# Patient Record
Sex: Female | Born: 1940 | Race: White | Hispanic: No | Smoking: Former smoker
Health system: Southern US, Community
[De-identification: ages and names within clinical notes are randomized; demographics above are authoritative.]

## PROBLEM LIST (undated history)

## (undated) DIAGNOSIS — K219 Gastro-esophageal reflux disease without esophagitis: Secondary | ICD-10-CM

## (undated) DIAGNOSIS — E785 Hyperlipidemia, unspecified: Secondary | ICD-10-CM

## (undated) DIAGNOSIS — K635 Polyp of colon: Secondary | ICD-10-CM

## (undated) DIAGNOSIS — F419 Anxiety disorder, unspecified: Secondary | ICD-10-CM

## (undated) DIAGNOSIS — M1990 Unspecified osteoarthritis, unspecified site: Secondary | ICD-10-CM

## (undated) DIAGNOSIS — K5792 Diverticulitis of intestine, part unspecified, without perforation or abscess without bleeding: Secondary | ICD-10-CM

## (undated) DIAGNOSIS — K7581 Nonalcoholic steatohepatitis (NASH): Secondary | ICD-10-CM

## (undated) DIAGNOSIS — E669 Obesity, unspecified: Secondary | ICD-10-CM

## (undated) DIAGNOSIS — J189 Pneumonia, unspecified organism: Secondary | ICD-10-CM

## (undated) DIAGNOSIS — I10 Essential (primary) hypertension: Secondary | ICD-10-CM

## (undated) DIAGNOSIS — K7460 Unspecified cirrhosis of liver: Secondary | ICD-10-CM

## (undated) HISTORY — DX: Hyperlipidemia, unspecified: E78.5

## (undated) HISTORY — DX: Polyp of colon: K63.5

## (undated) HISTORY — DX: Gastro-esophageal reflux disease without esophagitis: K21.9

## (undated) HISTORY — DX: Diverticulitis of intestine, part unspecified, without perforation or abscess without bleeding: K57.92

## (undated) HISTORY — DX: Unspecified osteoarthritis, unspecified site: M19.90

## (undated) HISTORY — DX: Obesity, unspecified: E66.9

## (undated) HISTORY — PX: ROTATOR CUFF REPAIR: SHX139

## (undated) HISTORY — PX: VEIN SURGERY: SHX48

## (undated) HISTORY — DX: Nonalcoholic steatohepatitis (NASH): K75.81

## (undated) HISTORY — DX: Essential (primary) hypertension: I10

---

## 1970-08-30 HISTORY — PX: PARTIAL HYSTERECTOMY: SHX80

## 1971-08-31 HISTORY — PX: OOPHORECTOMY: SHX86

## 1979-08-31 HISTORY — PX: CHOLECYSTECTOMY: SHX55

## 2000-01-13 ENCOUNTER — Encounter: Payer: Self-pay | Admitting: Emergency Medicine

## 2000-01-13 ENCOUNTER — Inpatient Hospital Stay (HOSPITAL_COMMUNITY): Admission: EM | Admit: 2000-01-13 | Discharge: 2000-01-14 | Payer: Self-pay | Admitting: Emergency Medicine

## 2000-03-10 ENCOUNTER — Encounter: Payer: Self-pay | Admitting: Gastroenterology

## 2000-03-10 ENCOUNTER — Ambulatory Visit (HOSPITAL_COMMUNITY): Admission: RE | Admit: 2000-03-10 | Discharge: 2000-03-10 | Payer: Self-pay | Admitting: Gastroenterology

## 2001-04-12 ENCOUNTER — Encounter: Admission: RE | Admit: 2001-04-12 | Discharge: 2001-04-12 | Payer: Self-pay | Admitting: Obstetrics and Gynecology

## 2001-04-12 ENCOUNTER — Encounter: Payer: Self-pay | Admitting: Obstetrics and Gynecology

## 2001-07-28 ENCOUNTER — Emergency Department (HOSPITAL_COMMUNITY): Admission: EM | Admit: 2001-07-28 | Discharge: 2001-07-28 | Payer: Self-pay | Admitting: Emergency Medicine

## 2001-07-28 ENCOUNTER — Encounter: Payer: Self-pay | Admitting: Emergency Medicine

## 2002-05-23 ENCOUNTER — Encounter: Admission: RE | Admit: 2002-05-23 | Discharge: 2002-06-15 | Payer: Self-pay | Admitting: Orthopedic Surgery

## 2002-07-16 ENCOUNTER — Encounter: Admission: RE | Admit: 2002-07-16 | Discharge: 2002-10-02 | Payer: Self-pay | Admitting: Orthopedic Surgery

## 2003-04-23 ENCOUNTER — Other Ambulatory Visit: Admission: RE | Admit: 2003-04-23 | Discharge: 2003-04-23 | Payer: Self-pay | Admitting: Obstetrics & Gynecology

## 2003-06-03 ENCOUNTER — Encounter: Admission: RE | Admit: 2003-06-03 | Discharge: 2003-06-18 | Payer: Self-pay | Admitting: Orthopedic Surgery

## 2004-03-25 ENCOUNTER — Encounter: Admission: RE | Admit: 2004-03-25 | Discharge: 2004-05-26 | Payer: Self-pay | Admitting: Orthopedic Surgery

## 2005-08-24 ENCOUNTER — Ambulatory Visit (HOSPITAL_COMMUNITY): Admission: RE | Admit: 2005-08-24 | Discharge: 2005-08-24 | Payer: Self-pay | Admitting: Otolaryngology

## 2005-08-24 ENCOUNTER — Encounter: Admission: RE | Admit: 2005-08-24 | Discharge: 2005-08-24 | Payer: Self-pay | Admitting: Otolaryngology

## 2006-04-18 ENCOUNTER — Encounter: Admission: RE | Admit: 2006-04-18 | Discharge: 2006-04-18 | Payer: Self-pay

## 2006-05-25 ENCOUNTER — Encounter: Admission: RE | Admit: 2006-05-25 | Discharge: 2006-05-25 | Payer: Self-pay | Admitting: Family Medicine

## 2007-01-26 ENCOUNTER — Encounter: Payer: Self-pay | Admitting: Pulmonary Disease

## 2007-02-02 ENCOUNTER — Encounter: Payer: Self-pay | Admitting: Pulmonary Disease

## 2010-04-09 ENCOUNTER — Ambulatory Visit (HOSPITAL_COMMUNITY)
Admission: RE | Admit: 2010-04-09 | Discharge: 2010-04-10 | Payer: Self-pay | Source: Home / Self Care | Admitting: Obstetrics and Gynecology

## 2010-07-24 DIAGNOSIS — E669 Obesity, unspecified: Secondary | ICD-10-CM | POA: Insufficient documentation

## 2010-07-24 DIAGNOSIS — M25559 Pain in unspecified hip: Secondary | ICD-10-CM | POA: Insufficient documentation

## 2010-07-24 DIAGNOSIS — K2100 Gastro-esophageal reflux disease with esophagitis, without bleeding: Secondary | ICD-10-CM | POA: Insufficient documentation

## 2010-07-24 DIAGNOSIS — J309 Allergic rhinitis, unspecified: Secondary | ICD-10-CM | POA: Insufficient documentation

## 2010-07-24 DIAGNOSIS — E782 Mixed hyperlipidemia: Secondary | ICD-10-CM | POA: Insufficient documentation

## 2010-07-24 DIAGNOSIS — E1165 Type 2 diabetes mellitus with hyperglycemia: Secondary | ICD-10-CM | POA: Insufficient documentation

## 2010-07-27 ENCOUNTER — Ambulatory Visit: Payer: Self-pay | Admitting: Pulmonary Disease

## 2010-07-27 DIAGNOSIS — R0609 Other forms of dyspnea: Secondary | ICD-10-CM | POA: Insufficient documentation

## 2010-07-27 DIAGNOSIS — R0989 Other specified symptoms and signs involving the circulatory and respiratory systems: Secondary | ICD-10-CM | POA: Insufficient documentation

## 2010-07-30 ENCOUNTER — Encounter: Payer: Self-pay | Admitting: Pulmonary Disease

## 2010-08-13 ENCOUNTER — Ambulatory Visit: Payer: Self-pay | Admitting: Pulmonary Disease

## 2010-08-27 ENCOUNTER — Ambulatory Visit: Payer: Self-pay | Admitting: Pulmonary Disease

## 2010-08-27 ENCOUNTER — Encounter: Payer: Self-pay | Admitting: Pulmonary Disease

## 2010-08-27 DIAGNOSIS — J45909 Unspecified asthma, uncomplicated: Secondary | ICD-10-CM | POA: Insufficient documentation

## 2010-08-27 DIAGNOSIS — J449 Chronic obstructive pulmonary disease, unspecified: Secondary | ICD-10-CM | POA: Insufficient documentation

## 2010-09-09 ENCOUNTER — Ambulatory Visit: Payer: Self-pay | Admitting: Cardiology

## 2010-09-14 ENCOUNTER — Telehealth (INDEPENDENT_AMBULATORY_CARE_PROVIDER_SITE_OTHER): Payer: Self-pay | Admitting: *Deleted

## 2010-09-15 ENCOUNTER — Ambulatory Visit (HOSPITAL_COMMUNITY)
Admission: RE | Admit: 2010-09-15 | Discharge: 2010-09-15 | Payer: Self-pay | Source: Home / Self Care | Attending: Cardiology | Admitting: Cardiology

## 2010-09-15 ENCOUNTER — Encounter: Payer: Self-pay | Admitting: Internal Medicine

## 2010-09-15 ENCOUNTER — Encounter: Payer: Self-pay | Admitting: Cardiology

## 2010-09-15 ENCOUNTER — Encounter (HOSPITAL_COMMUNITY)
Admission: RE | Admit: 2010-09-15 | Discharge: 2010-09-29 | Payer: Self-pay | Source: Home / Self Care | Attending: Cardiology | Admitting: Cardiology

## 2010-09-15 ENCOUNTER — Ambulatory Visit: Admission: RE | Admit: 2010-09-15 | Discharge: 2010-09-15 | Payer: Self-pay | Source: Home / Self Care

## 2010-09-17 ENCOUNTER — Ambulatory Visit: Admission: RE | Admit: 2010-09-17 | Discharge: 2010-09-17 | Payer: Self-pay | Source: Home / Self Care

## 2010-09-29 NOTE — Assessment & Plan Note (Signed)
Summary: consult for doe   Visit Type:  Follow-up Copy to:  Deborah Chalk Primary Provider/Referring Provider:  Catha Gosselin MD  CC:  Pulmonary Consult. pt being evaluated for asthma and increased SOB. Marland Kitchen  History of Present Illness: The pt is a 69y/o female who I have been asked to see for dyspnea.  The pt states that she had "asthma" diagnosed at age 52, and has had breathing issues for years.  She feels this has been getting worse this year.  She describes a one block doe on flat ground at a moderate pace, and will also get winded bringing groceries in from the car and vacuuming one room of her house.  She has cough at times, and is usually dry.  This is worse on lying down.  She feels the symptoms get better with steroids, but usually raises blood sugar.  She has tried qvar for the last one month, and feels it did not help.  She does have albuterol for rescue, and has been using it frequently.  The pt tells me she has a h/o an "enlarged heart", and is followed by Dr. Deborah Chalk.  Her last echo was 3 years ago.  She c/o sharp chest pain today with diaphoresis, and EKG in the office currently is normal.  She tells me this usually goes away with prilosec.  The pt is not active physically, and her weight is up about 15 pounds over the past one year.  She has had spirometry at Dr. Fredirick Maudlin office, and I have reviewed the tracings and data.  Unfortunately, they indicate poor test performance, with the majority not reaching a 6 sec expiratory time.  The pt has not had a recent cxr.  Preventive Screening-Counseling & Management  Alcohol-Tobacco     Smoking Status: quit  Current Medications (verified): 1)  Align  Caps (Probiotic Product) .... Per Bottle 2)  Simvastatin 80 Mg Tabs (Simvastatin) .... Once Daily 3)  Sertraline Hcl 100 Mg Tabs (Sertraline Hcl) .... Once Daily 4)  Hydrochlorothiazide 25 Mg Tabs (Hydrochlorothiazide) .... Once Daily 5)  Ramipril 5 Mg Caps (Ramipril) .... Once Daily 6)  Librax  2.5-5 Mg Caps (Clidinium-Chlordiazepoxide) .Marland Kitchen.. 1 Two Times A Day 7)  Hydrocortisone 2.5 % Crea (Hydrocortisone) .... As Directed 8)  Metformin Hcl 500 Mg Tabs (Metformin Hcl) .... Once Daily 9)  Fish Oil  Oil (Fish Oil) .... Once Daily 10)  Prilosec Otc 20 Mg Tbec (Omeprazole Magnesium) .... Once Daily As Needed  Allergies (verified): 1)  ! 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 family history of heart disease--sister stomach cancer--mother breast cancer--daughter aplastic anemia--son  Social History: Reviewed history and no changes required. 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 quit 2002. 1 1/2 ppd. started age 19 Smoking Status:  quit  Review of Systems       The patient complains of shortness of breath with activity, non-productive cough, chest pain, acid heartburn, weight change, nasal congestion/difficulty breathing through nose, ear ache, and anxiety.  The patient denies shortness of breath at rest, productive cough, coughing up blood, irregular heartbeats, indigestion, abdominal pain, difficulty swallowing,  sore throat, tooth/dental problems, headaches, sneezing, itching, depression, hand/feet swelling, joint stiffness or pain, rash, change in color of mucus, and fever.    Vital Signs:  Patient profile:   70 year old female Height:      65.5 inches Weight:      233.38 pounds BMI:     38.38 O2  Sat:      96 % on Room air Temp:     98.3 degrees F oral Pulse rate:   104 / minute BP sitting:   124 / 78  (left arm) Cuff size:   large  Vitals Entered By: Carver Fila (July 27, 2010 2:46 PM)  O2 Flow:  Room air CC: Pulmonary Consult. pt being evaluated for asthma and increased SOB.  Comments meds and allergies updated Phone number updated Carver Fila  July 27, 2010 2:46 PM    Physical Exam  General:  morbidly obese female in nad Initially, had chest discomfort and significant diaphoresis...resolved with prilosec. Eyes:  PERRLA and EOMI.   Nose:  patent without discharge Mouth:  clear, no exudates. Neck:  large neck, no jvd, tmg, LN Lungs:  totally clear to auscultation no wheezing or rhonchi Heart:  rrr, no mrg Abdomen:  soft and nontender, bs+ Extremities:  +edema noted, no cyanosis  pulses intact distally Neurologic:  alert and oriented, moves all 4.   Impression & Recommendations:  Problem # 1:  DYSPNEA ON EXERTION (ICD-786.09) the pt has doe of unknown etiology.  I have reviewed the various possible causes with her, and it is unclear if she has a pulmonary issue.  She is morbidly obese and clearly has deconditioning.  She also has significant reflux disease which can cause the perception of doe.  It is unclear whether she has asthma or not, but find it unlikely if she did not have improvement with qvar (an excellent medication for asthma).  Would also consider whether she needs further cardiac w/u.  At this point, would like to do full pfts and see if we can get adequate technique/data.  Will also check a cxr for completeness.  Would also recommend that she come off ACE for a trial period to see if this contributes to her upper airway symptoms as well.  Problem # 2:  REFLUX ESOPHAGITIS (ICD-530.11) the pt had chest pain and diaphoresis on presentation today that resolved with prilosec?  Her EKG was unremarkable.  Would like to try her on more aggressive treatment  for reflux, and I have also asked her to stop fish oil for a period of time (other option is to store in freezer).  Medications Added to Medication List This Visit: 1)  Prilosec Otc 20 Mg Tbec (Omeprazole magnesium) .... Once daily as needed 2)  Omeprazole 40 Mg Cpdr (Omeprazole) .... One in am and pm  Other Orders: Consultation Level V (16109) EKG w/ Interpretation (93000) Pulmonary Referral (Pulmonary) T-2 View CXR (71020TC)  Patient Instructions: 1)  will check cxr today, and call you with the results. 2)  will schedule for full breathing studies, and see you back on same day to discuss. 3)  stop prilosec.  Will start omeprazole 40mg  one in am and pm while we are doing your evaluation. 4)  stay off fish oil. 5)  will send a note to Drs Clarene Duke and Deborah Chalk to consider changing your ramipril to another medication as a trial.   6)  work on weight loss and conditioning.  Prescriptions: OMEPRAZOLE 40 MG CPDR (OMEPRAZOLE) one in am  and pm  #60 x 1   Entered and Authorized by:   Barbaraann Share MD   Signed by:   Barbaraann Share MD on 07/27/2010   Method used:   Print then Give to Patient   RxID:   [id number]    Immunization History:  Influenza Immunization History:    Influenza:  historical (05/30/2010)  Pneumovax Immunization History:    Pneumovax:  historical (05/30/2008)

## 2010-10-01 NOTE — Progress Notes (Signed)
Summary: nuc pre procedure  Phone Note Outgoing Call Call back at Home Phone [phone]   Call placed by: Cathlyn Parsons RN,  September 14, 2010 2:35 PM Call placed to: Patient Reason for Call: Confirm/change Appt Summary of Call: Reviewed information on [employer] Information Sheet (see scanned document for further details).  Spoke with patient.      Nuclear Med Background Indications for Stress Test: Evaluation for Ischemia   History: Myocardial Perfusion Study  History Comments: 08 MPS nl  Symptoms: Chest Pain, DOE    Nuclear Pre-Procedure Cardiac Risk Factors: Family History - CAD, History of Smoking, Hypertension, Lipids, NIDDM Height (in): 66

## 2010-10-01 NOTE — Assessment & Plan Note (Signed)
Summary: Cardiology Nuclear Testing  Nuclear Med Background Indications for Stress Test: Evaluation for Ischemia   History: Asthma, Echo, Myocardial Perfusion Study  History Comments: 08 MPS nl and Echo EF=nl; mild LVH   Symptoms: Diaphoresis, DOE, Fatigue, Nausea, Palpitations, Rapid HR  Symptoms Comments: last episodex6weeks ago in MD office   Nuclear Pre-Procedure Cardiac Risk Factors: Family History - CAD, History of Smoking, Hypertension, Lipids, NIDDM Caffeine/Decaff Intake: None NPO After: 8:30 PM Lungs: clear IV 0.9% NS with Angio Cath: 20g     IV Site: R Antecubital IV Started by: Irven Baltimore, RN Chest Size (in) 42     Cup Size B     Height (in): 66 Weight (lb): 228 BMI: 36.93  Nuclear Med Study 1 or 2 day study:  2 day     Stress Test Type:  Carlton Adam Reading MD:  Kirk Ruths, MD     Referring MD:  S.Tennant Resting Radionuclide:  Technetium 61mTetrofosmin     Resting Radionuclide Dose:  33 mCi  Stress Radionuclide:  Technetium 919metrofosmin     Stress Radionuclide Dose:  33 mCi   Stress Protocol   Lexiscan: 0.4 mg   Stress Test Technologist:  TeIleene HutchinsonEMT-P     Nuclear Technologist:  StCharlton AmorCNMT  Rest Procedure  Myocardial perfusion imaging was performed at rest 45 minutes following the intravenous administration of Technetium 9956mtrofosmin.  Stress Procedure  The patient received IV Lexiscan 0.4 mg over 15-seconds.  Technetium 33m16mrofosmin injected at 30-seconds.  There were no significant changes with infusion.  Quantitative spect images were obtained after a 45 minute delay.  QPS Raw Data Images:  Soft tissue (diaphragm, breast) surround heart. Stress Images:  Normal homogeneous uptake in all areas of the myocardium. Rest Images:  Normal homogeneous uptake in all areas of the myocardium. Subtraction (SDS):  No evidence of ischemia. Transient Ischemic Dilatation:  0.96  (Normal <1.22)  Lung/Heart Ratio:  0.32  (Normal  <0.45)  Quantitative Gated Spect Images QGS EDV:  79 ml QGS ESV:  24 ml QGS EF:  70 %   Overall Impression  Exercise Capacity: Lexiscan with no exercise. BP Response: Normal blood pressure response. Clinical Symptoms: No chest pain ECG Impression: No significant ST segment change suggestive of ischemia. Overall Impression: Normal stress nuclear study.  Appended Document: Cardiology Nuclear Testing COPY SENT TO DR. TENNDoreatha Lew

## 2010-10-01 NOTE — Assessment & Plan Note (Signed)
Summary: rov for dyspnea   Copy to:  Deborah Chalk Primary Provider/Referring Provider:  Catha Gosselin MD  CC:  Pt is here for a f/u appt to discuss PFT results.  Pt states she had a "cold" a few weeks ago and coughed up bright red blood but states she is feeling better now. .  History of Present Illness: The pt comes in today for f/u of her doe.  At the last visit, she was scheduled for pfts, and also addressed possible upper airway dysfunction issues with initiating a ppi.  It was also suggested that she come off ACE for a period of time.  The pt thinks her cough is better with PPI, but remains on her ACE at this time.  She had a URI a few weeks ago with scant mucus with red streaks, but none since.  Her cxr last visit showed CM and prominent BV markings suggestive of mild edema, but no airspace disease or parenchymal lung disease.  She had pfts today that show moderate airflow obstruction with airtrapping, no restriction, and a very mild decrease in DLCO.  I have reviewed her pfts with her in detail, and answered all of her questions.  Current Medications (verified): 1)  Align  Caps (Probiotic Product) .... Per Bottle 2)  Simvastatin 80 Mg Tabs (Simvastatin) .... Once Daily 3)  Sertraline Hcl 100 Mg Tabs (Sertraline Hcl) .... Once Daily 4)  Hydrochlorothiazide 25 Mg Tabs (Hydrochlorothiazide) .... Once Daily 5)  Ramipril 5 Mg Caps (Ramipril) .... Once Daily 6)  Librax 2.5-5 Mg Caps (Clidinium-Chlordiazepoxide) .Marland Kitchen.. 1 Two Times A Day 7)  Hydrocortisone 2.5 % Crea (Hydrocortisone) .... As Directed 8)  Metformin Hcl 500 Mg Tabs (Metformin Hcl) .... Once Daily 9)  Omeprazole 40 Mg Cpdr (Omeprazole) .... One in Am and Pm  Allergies (verified): 1)  ! Codeine  Past History:  Past medical, surgical, family and social histories (including risk factors) reviewed, and no changes noted (except as noted below).  Past Medical History: Reviewed history from 07/27/2010 and no changes required.  MIXED  HYPERLIPIDEMIA (ICD-272.2) REFLUX ESOPHAGITIS (ICD-530.11)--h/o GERD, h/o stricture with dilatation ALLERGIC RHINITIS CAUSE UNSPECIFIED (ICD-477.9) DIAB W/O COMP TYPE II/UNS NOT STATED UNCNTRL (ICD-250.00) PAIN IN JOINT PELVIC REGION AND THIGH (ICD-719.45) UNSPECIFIED ESSENTIAL HYPERTENSION (ICD-401.9) OBESITY, UNSPECIFIED (ICD-278.00) PURE HYPERCHOLESTEROLEMIA (ICD-272.0)    Past Surgical History: Reviewed history from 07/27/2010 and no changes required. Cholecystectomy sinus surgery hysterectomy  Family History: Reviewed history from 07/27/2010 and no changes required. family history of heart disease--sister stomach cancer--mother breast cancer--daughter aplastic anemia--son  Social History: Reviewed history from 07/27/2010 and no changes required. 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 quit 2002. 1 1/2 ppd. started age 67  Review of Systems       The patient complains of shortness of breath with activity, non-productive cough, acid heartburn, anxiety, and hand/feet swelling.  The patient denies shortness of breath at rest, productive cough, coughing up blood, chest pain, irregular heartbeats, indigestion, loss of appetite, weight change, abdominal pain, difficulty swallowing, sore throat, tooth/dental problems, headaches, nasal congestion/difficulty breathing through nose, sneezing, itching, ear ache, depression, joint stiffness or pain, rash, change in color of mucus, and fever.    Vital Signs:  Patient profile:   70 year old female Height:      66 inches Weight:      227 pounds BMI:     36.77 O2 Sat:      93 % on Room air Temp:  97.8 degrees F oral Pulse rate:   101 / minute BP sitting:   120 / 64  (left arm) Cuff size:   large  Vitals Entered By: Arman Filter LPN (August 27, 2010 11:45 AM)  O2 Flow:  Room air CC: Pt is here for a f/u appt to discuss PFT results.  Pt states she had a "cold" a few weeks  ago and coughed up bright red blood but states she is feeling better now.  Comments Medications reviewed with patient Arman Filter LPN  August 27, 2010 11:49 AM    Physical Exam  General:  ow female in nad Extremities:  mild edema noted, no cyanosis Neurologic:  alert and oriented, moves all 4.   Impression & Recommendations:  Problem # 1:  INTRINSIC ASTHMA, UNSPECIFIED (ICD-493.10) the pt was found to have moderate airflow obstruction by pfts today, but DLCO was only minimally reduced.  Most likely this is due to asthma, but she can easily have a component of emphysema as well with her past smoking history.  I think she would benefit from a trial with a ICS/LABA combination, although she did not see much improvement with qvar alone.  I still think she has a lot of upper airway issues as well, with reflux and possibly ACE contributing.   Would like to try her off ramipril for a period of time, and asked her to continue with the omeprazole.    Problem # 2:  DYSPNEA ON EXERTION (ICD-786.09)  I suspect the pt has multifactorial dyspnea.  She has airflow obstruction as above, is overweight with deconditioning, and has cardiac issues.  I have asked her to work hard on weight loss and some type of conditioning program.    Other Orders: Est. Patient Level III (16109)  Patient Instructions: 1)  change ramipril to micardis 40mg  one each day until you see Dr. Deborah Chalk. 2)  trial of dulera 100/5  2 inhalations am and pm everyday...rinse mouth well after using.   3)  continue with omeprazole 4)  work on weight loss and conditioning. 5)  followup with me in 6 weeks.

## 2010-10-01 NOTE — Miscellaneous (Signed)
Summary: Orders Update pft charges  Clinical Lists Changes  Orders: Added new Service order of Carbon Monoxide diffusing w/capacity (94720) - Signed Added new Service order of Lung Volumes (94240) - Signed Added new Service order of Spirometry (Pre & Post) (94060) - Signed 

## 2010-10-05 ENCOUNTER — Ambulatory Visit (INDEPENDENT_AMBULATORY_CARE_PROVIDER_SITE_OTHER): Payer: Medicare Other | Admitting: Pulmonary Disease

## 2010-10-05 ENCOUNTER — Encounter: Payer: Self-pay | Admitting: Pulmonary Disease

## 2010-10-05 DIAGNOSIS — J45909 Unspecified asthma, uncomplicated: Secondary | ICD-10-CM

## 2010-10-05 DIAGNOSIS — R0989 Other specified symptoms and signs involving the circulatory and respiratory systems: Secondary | ICD-10-CM

## 2010-10-05 DIAGNOSIS — R0609 Other forms of dyspnea: Secondary | ICD-10-CM

## 2010-10-15 NOTE — Assessment & Plan Note (Signed)
Summary: rov for asthma, upper airway dysfunction   Copy to:  Doreatha Lew Primary Provider/Referring Provider:  Hulan Fess MD  CC:  Followup on dyspnea.  Pt states that her breathing has improved since last seen.  Only has occ cough due to "tickle in throat".  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  She was started on dulera, and has been kept off ACE,  with treatment of her reflux with PPI.  She is doing much better since the last visit, and has seen improvement in her exertional tolerance.  She has minimal cough at this time.    Current Medications (verified): 1)  Align  Caps (Probiotic Product) .... Per Bottle 2)  Simvastatin 80 Mg Tabs (Simvastatin) .... Once Daily 3)  Sertraline Hcl 100 Mg Tabs (Sertraline Hcl) .... Once Daily 4)  Hydrochlorothiazide 25 Mg Tabs (Hydrochlorothiazide) .... Once Daily 5)  Librax 2.5-5 Mg Caps (Clidinium-Chlordiazepoxide) .Marland Kitchen.. 1 Two Times A Day 6)  Hydrocortisone 2.5 % Crea (Hydrocortisone) .... As Directed 7)  Metformin Hcl 500 Mg Tabs (Metformin Hcl) .... Once Daily 8)  Omeprazole 40 Mg Cpdr (Omeprazole) .... One in Am and Pm 9)  Dulera 100-5 Mcg/act Aero (Mometasone Furo-Formoterol Fum) .... 2 Puffs Two Times A Day 10)  Cozaar 100 Mg Tabs (Losartan Potassium) .Marland Kitchen.. 1 Once Daily  Allergies (verified): 1)  ! Codeine  Review of Systems       The patient complains of non-productive cough and headaches.  The patient denies shortness of breath with activity, shortness of breath at rest, productive cough, coughing up blood, chest pain, irregular heartbeats, acid heartburn, indigestion, loss of appetite, weight change, abdominal pain, difficulty swallowing, sore throat, tooth/dental problems, nasal congestion/difficulty breathing through nose, sneezing, itching, ear ache, anxiety, depression, hand/feet swelling, joint  stiffness or pain, rash, change in color of mucus, and fever.    Vital Signs:  Patient profile:   70 year old female Weight:      233 pounds O2 Sat:      95 % on Room air Temp:     99.1 degrees F oral Pulse rate:   105 / minute BP sitting:   110 / 70  (left arm) Cuff size:   large  Vitals Entered By: Tilden Dome (October 05, 2010 10:21 AM)  O2 Flow:  Room air  Physical Exam  General:  ow female in nad  Lungs:  fairly clear to auscultation, no wheezing or rhonchi Heart:  rrr, no mrg Extremities:  mild ankle edema, no cyanosis  Neurologic:  alert and oriented, moves all 4.   Impression & Recommendations:  Problem # 1:  INTRINSIC ASTHMA, UNSPECIFIED (ICD-493.10) the pt is doing better since she has been on dulera, as well as working on upper airway irritants (ACE, reflux).  She is having minimal cough at this time.  Again, I suspect this is asthma >> emphysema, and that she will continue to make progress if she can work on weight loss and conditioning as well.  Medications Added to Medication List This Visit: 1)  Dulera 100-5 Mcg/act Aero (Mometasone furo-formoterol fum) .... 2 puffs two times a day 2)  Cozaar 100 Mg Tabs (Losartan potassium) .Marland Kitchen.. 1 once daily  Other Orders: Est. Patient Level III (73220)  Patient Instructions: 1)  stay on dulera. 2)  work on weight reduction 3)  ok to decrease omeprazole  to once a day.  If your symptoms worsen, can look into filling out preauth form with insurance. 4)  followup with me in 71mo.   Prescriptions: DULERA 100-5 MCG/ACT AERO (MOMETASONE FURO-FORMOTEROL FUM) 2 puffs two times a day  #1 x 6   Entered and Authorized by:   KKathee DeltonMD   Signed by:   KKathee DeltonMD on 10/05/2010   Method used:   Print then Give to Patient   RxID:   [id number]

## 2010-11-06 ENCOUNTER — Ambulatory Visit: Payer: Self-pay | Admitting: Cardiology

## 2010-11-12 ENCOUNTER — Telehealth: Payer: Self-pay | Admitting: Pulmonary Disease

## 2010-11-13 LAB — COMPREHENSIVE METABOLIC PANEL
ALT: 32 U/L (ref 0–35)
AST: 47 U/L — ABNORMAL HIGH (ref 0–37)
Albumin: 4.2 g/dL (ref 3.5–5.2)
Alkaline Phosphatase: 52 U/L (ref 39–117)
BUN: 8 mg/dL (ref 6–23)
CO2: 29 mEq/L (ref 19–32)
Calcium: 9.2 mg/dL (ref 8.4–10.5)
Chloride: 94 mEq/L — ABNORMAL LOW (ref 96–112)
Creatinine, Ser: 0.52 mg/dL (ref 0.4–1.2)
GFR calc Af Amer: >60 mL/min (ref >60)
GFR calc non Af Amer: >60 mL/min (ref >60)
Glucose, Bld: 89 mg/dL (ref 70–99)
Potassium: 3.3 mEq/L — ABNORMAL LOW (ref 3.5–5.1)
Sodium: 131 mEq/L — ABNORMAL LOW (ref 135–145)
Total Bilirubin: 0.9 mg/dL (ref 0.3–1.2)
Total Protein: 7.5 g/dL (ref 6.0–8.3)

## 2010-11-13 LAB — URINE MICROSCOPIC-ADD ON

## 2010-11-13 LAB — CBC
HCT: 35.4 % — ABNORMAL LOW (ref 36.0–46.0)
HCT: 38.8 % (ref 36.0–46.0)
Hemoglobin: 12.2 g/dL (ref 12.0–15.0)
Hemoglobin: 13.2 g/dL (ref 12.0–15.0)
MCH: 31.4 pg (ref 26.0–34.0)
MCH: 32.4 pg (ref 26.0–34.0)
MCHC: 33.9 g/dL (ref 30.0–36.0)
MCHC: 34.6 g/dL (ref 30.0–36.0)
MCV: 92.5 fL (ref 78.0–100.0)
MCV: 93.7 fL (ref 78.0–100.0)
Platelets: 143 10*3/uL — ABNORMAL LOW (ref 150–400)
Platelets: 153 10*3/uL (ref 150–400)
RBC: 3.78 MIL/uL — ABNORMAL LOW (ref 3.87–5.11)
RBC: 4.2 MIL/uL (ref 3.87–5.11)
RDW: 13.6 % (ref 11.5–15.5)
RDW: 13.9 % (ref 11.5–15.5)
WBC: 8.1 10*3/uL (ref 4.0–10.5)
WBC: 9.1 10*3/uL (ref 4.0–10.5)

## 2010-11-13 LAB — GLUCOSE, CAPILLARY
Glucose-Capillary: 108 mg/dL — ABNORMAL HIGH (ref 70–99)
Glucose-Capillary: 112 mg/dL — ABNORMAL HIGH (ref 70–99)
Glucose-Capillary: 120 mg/dL — ABNORMAL HIGH (ref 70–99)
Glucose-Capillary: 134 mg/dL — ABNORMAL HIGH (ref 70–99)

## 2010-11-13 LAB — URINALYSIS, ROUTINE W REFLEX MICROSCOPIC
Bilirubin Urine: NEGATIVE
Glucose, UA: NEGATIVE mg/dL
Ketones, ur: NEGATIVE mg/dL
Leukocytes, UA: NEGATIVE
Nitrite: NEGATIVE
Protein, ur: NEGATIVE mg/dL
Specific Gravity, Urine: 1.01 (ref 1.005–1.030)
Urobilinogen, UA: 0.2 mg/dL (ref 0.0–1.0)
pH: 7.5 (ref 5.0–8.0)

## 2010-11-13 LAB — SURGICAL PCR SCREEN
MRSA, PCR: NEGATIVE
Staphylococcus aureus: NEGATIVE

## 2010-11-17 NOTE — Progress Notes (Signed)
Summary: refil on dulera-  Phone Note Call from Patient Call back at Home Phone [phone]   Caller: Patient Call For: Haim Hansson Reason for Call: Talk to Nurse Summary of Call: Patient reqeusting refill--dulera--[facility] Initial call taken by: Lehman Prom,  November 12, 2010 8:58 AM  Follow-up for Phone Call        lmomtcb x1. Looks like [facility] gave pt rx on 10/05/10 w/ 6 refills Mindy Silva  November 12, 2010 9:57 AM  Spoke with pt and she states she threw the written rx away that was given at last OV so she needs rx sent in to pharmacy. rx sent. pt aware.Carron Curie CMA  November 12, 2010 11:07 AM     Prescriptions: Elwin Sleight 100-5 MCG/ACT AERO (MOMETASONE FURO-FORMOTEROL FUM) 2 puffs two times a day  #1 x 6   Entered by:   Carron Curie CMA   Authorized by:   Barbaraann Share MD   Signed by:   Carron Curie CMA on 11/12/2010   Method used:   Electronically to        [phone]* (retail)       [address] [place], [place]  [address]       Ph: [phone]       Fax: [phone]   RxID:   [id number]

## 2010-12-10 ENCOUNTER — Ambulatory Visit: Payer: Self-pay | Admitting: Cardiology

## 2010-12-12 ENCOUNTER — Other Ambulatory Visit: Payer: Self-pay | Admitting: Nurse Practitioner

## 2010-12-12 DIAGNOSIS — I10 Essential (primary) hypertension: Secondary | ICD-10-CM

## 2010-12-14 ENCOUNTER — Other Ambulatory Visit: Payer: Self-pay | Admitting: *Deleted

## 2010-12-14 MED ORDER — LOSARTAN POTASSIUM 100 MG PO TABS: 100.0000 mg | ORAL_TABLET | Freq: Every day | ORAL | Status: DC

## 2010-12-22 ENCOUNTER — Emergency Department (HOSPITAL_COMMUNITY): Payer: Medicare Other

## 2010-12-22 ENCOUNTER — Emergency Department (HOSPITAL_COMMUNITY)
Admission: EM | Admit: 2010-12-22 | Discharge: 2010-12-22 | Disposition: A | Discharge status: Home / Self Care | Payer: Medicare Other | Attending: Emergency Medicine | Admitting: Emergency Medicine

## 2010-12-22 DIAGNOSIS — T17308A Unspecified foreign body in larynx causing other injury, initial encounter: Secondary | ICD-10-CM | POA: Insufficient documentation

## 2010-12-22 DIAGNOSIS — E119 Type 2 diabetes mellitus without complications: Secondary | ICD-10-CM | POA: Insufficient documentation

## 2010-12-22 DIAGNOSIS — R05 Cough: Secondary | ICD-10-CM | POA: Insufficient documentation

## 2010-12-22 DIAGNOSIS — R0602 Shortness of breath: Secondary | ICD-10-CM | POA: Insufficient documentation

## 2010-12-22 DIAGNOSIS — R059 Cough, unspecified: Secondary | ICD-10-CM | POA: Insufficient documentation

## 2010-12-22 DIAGNOSIS — I10 Essential (primary) hypertension: Secondary | ICD-10-CM | POA: Insufficient documentation

## 2010-12-22 DIAGNOSIS — IMO0002 Reserved for concepts with insufficient information to code with codable children: Secondary | ICD-10-CM | POA: Insufficient documentation

## 2011-01-28 ENCOUNTER — Ambulatory Visit: Payer: Self-pay | Admitting: Cardiology

## 2011-03-23 ENCOUNTER — Encounter: Payer: Self-pay | Admitting: Family Medicine

## 2011-03-23 ENCOUNTER — Ambulatory Visit (INDEPENDENT_AMBULATORY_CARE_PROVIDER_SITE_OTHER): Payer: Medicare Other | Admitting: Family Medicine

## 2011-03-23 DIAGNOSIS — I10 Essential (primary) hypertension: Secondary | ICD-10-CM

## 2011-03-23 DIAGNOSIS — F329 Major depressive disorder, single episode, unspecified: Secondary | ICD-10-CM

## 2011-03-23 DIAGNOSIS — F32A Depression, unspecified: Secondary | ICD-10-CM

## 2011-03-23 DIAGNOSIS — K589 Irritable bowel syndrome without diarrhea: Secondary | ICD-10-CM | POA: Insufficient documentation

## 2011-03-23 DIAGNOSIS — E7800 Pure hypercholesterolemia, unspecified: Secondary | ICD-10-CM

## 2011-03-23 DIAGNOSIS — E119 Type 2 diabetes mellitus without complications: Secondary | ICD-10-CM

## 2011-03-23 LAB — BASIC METABOLIC PANEL
BUN: 12 mg/dL (ref 6–23)
CO2: 36 mEq/L — ABNORMAL HIGH (ref 19–32)
Calcium: 9.8 mg/dL (ref 8.4–10.5)
Chloride: 98 mEq/L (ref 96–112)
Creatinine, Ser: 0.5 mg/dL (ref 0.4–1.2)
GFR: 133.18 mL/min (ref >60.00)
Glucose, Bld: 125 mg/dL — ABNORMAL HIGH (ref 70–99)
Potassium: 3.9 mEq/L (ref 3.5–5.1)
Sodium: 139 mEq/L (ref 135–145)

## 2011-03-23 LAB — HEMOGLOBIN A1C: Hgb A1c MFr Bld: 7 % — ABNORMAL HIGH (ref 4.6–6.5)

## 2011-03-23 MED ORDER — LOSARTAN POTASSIUM 50 MG PO TABS: 50.0000 mg | ORAL_TABLET | Freq: Every day | ORAL | Status: DC

## 2011-03-23 MED ORDER — SIMVASTATIN 80 MG PO TABS: 80.0000 mg | ORAL_TABLET | Freq: Every day | ORAL | Status: DC

## 2011-03-23 MED ORDER — SERTRALINE HCL 50 MG PO TABS: 50.0000 mg | ORAL_TABLET | Freq: Every day | ORAL | Status: DC

## 2011-03-23 MED ORDER — ALPRAZOLAM 0.5 MG PO TABS: 0.5000 mg | ORAL_TABLET | Freq: Three times a day (TID) | ORAL | Status: DC | PRN

## 2011-03-23 MED ORDER — HYDROCHLOROTHIAZIDE 25 MG PO TABS: 25.0000 mg | ORAL_TABLET | Freq: Every day | ORAL | Status: DC

## 2011-03-23 MED ORDER — OMEPRAZOLE 40 MG PO CPDR: 40.0000 mg | DELAYED_RELEASE_CAPSULE | Freq: Every day | ORAL | Status: DC

## 2011-03-23 NOTE — Progress Notes (Signed)
  Subjective:    Patient ID: Rachael Buck, female    DOB: [birth year], 69 y.o.   MRN: [id number]  HPI New to establish.  Previous MD- [facility].  DM- chronic problem for pt.  Reports A1Cs have been consistently in the 5.7-5.9 range.  Would like to stop Metformin if possible.  Not checking sugars.  Asymptomatic.  HTN- chronic problem for pt, this is excellently controlled.  On Cozaar, HCTZ.  No CP, SOB, edema, HAs, visual changes.  Hyperlipidemia- chronic problem for pt, on Zocor.  LDL goal is <70 due to DM.  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  This was stopped by Dr Clarene Duke.  Pt had horrible diarrhea.  Was switched to Librax w/ fair control but is having a difficult time w/ cost.  Would like to resume the xanax if possible.  Depression- pt reports this was situational and would like to stop meds if possible.  Is taking Zoloft 100.   Review of Systems For ROS see HPI     Objective:   Physical Exam  Vitals reviewed. Constitutional: She is oriented to person, place, and time. She appears well-developed and well-nourished. No distress.       overwt  HENT:  Head: Normocephalic and atraumatic.  Eyes: Conjunctivae and EOM are normal. Pupils are equal, round, and reactive to light.  Neck: Normal range of motion. Neck supple. No thyromegaly present.  Cardiovascular: Normal rate, regular rhythm, normal heart sounds and intact distal pulses.   No murmur heard. Pulmonary/Chest: Effort normal and breath sounds normal. No respiratory distress.  Abdominal: Soft. She exhibits no distension. There is no tenderness.  Musculoskeletal: She exhibits no edema.  Lymphadenopathy:    She has no cervical adenopathy.  Neurological: She is alert and oriented to person, place, and time.  Skin: Skin is warm and dry.  Psychiatric: She has a normal mood and affect. Her behavior is normal.          Assessment & Plan:

## 2011-03-23 NOTE — Patient Instructions (Signed)
Follow up in 3 months to recheck diabetes We'll notify you of your lab results and adjust/stop the metformin if possible Start the 50mg  Zoloft for the next month and then cut the pill in half for a month and then stop Start the xanax as needed Call with any questions or concerns Welcome!  We're glad to have you!

## 2011-03-25 ENCOUNTER — Ambulatory Visit: Payer: Medicare Other | Admitting: Family Medicine

## 2011-03-28 DIAGNOSIS — F32A Depression, unspecified: Secondary | ICD-10-CM | POA: Insufficient documentation

## 2011-03-28 DIAGNOSIS — F329 Major depressive disorder, single episode, unspecified: Secondary | ICD-10-CM | POA: Insufficient documentation

## 2011-03-28 NOTE — Assessment & Plan Note (Signed)
Due to cost will switch from Librax back to xanax.  Reviewed supportive care and red flags that should prompt return.  Pt expressed understanding and is in agreement w/ plan.

## 2011-03-28 NOTE — Assessment & Plan Note (Addendum)
Will decrease zoloft to 50mg  daily and then will plan to decrease this after 1 month and then stop entirely after another month.  Pt expressed understanding and is in agreement w/ plan.

## 2011-03-28 NOTE — Assessment & Plan Note (Signed)
Pt reports that A1Cs have been well controlled and would like to stop meds if possible.  Will need to check labs.  If A1C is as well controlled as she reports we can stop meds, otherwise will need to continue.  Pt expressed understanding and is in agreement w/ plan.

## 2011-03-28 NOTE — Assessment & Plan Note (Signed)
BP excellently controlled.  Asymptomatic.  Will decrease current Cozaar dose to 50mg  and see if BP remains at goal.  Reviewed supportive care and red flags that should prompt return.  Pt expressed understanding and is in agreement w/ plan.

## 2011-03-28 NOTE — Assessment & Plan Note (Signed)
Tolerating statin w/out difficulty.  Check labs and adjust meds prn.

## 2011-03-31 ENCOUNTER — Encounter: Payer: Self-pay | Admitting: Pulmonary Disease

## 2011-04-05 ENCOUNTER — Ambulatory Visit: Payer: Self-pay | Admitting: Pulmonary Disease

## 2011-04-20 ENCOUNTER — Ambulatory Visit: Payer: Medicare Other | Admitting: Family Medicine

## 2011-05-31 ENCOUNTER — Encounter: Payer: Self-pay | Admitting: Family Medicine

## 2011-05-31 ENCOUNTER — Ambulatory Visit (INDEPENDENT_AMBULATORY_CARE_PROVIDER_SITE_OTHER): Payer: Medicare Other | Admitting: Family Medicine

## 2011-05-31 DIAGNOSIS — R109 Unspecified abdominal pain: Secondary | ICD-10-CM | POA: Insufficient documentation

## 2011-05-31 LAB — CBC WITH DIFFERENTIAL/PLATELET
Basophils Absolute: 0 10*3/uL (ref 0.0–0.1)
Basophils Relative: 0.5 % (ref 0.0–3.0)
Eosinophils Absolute: 0 10*3/uL (ref 0.0–0.7)
Eosinophils Relative: 0.5 % (ref 0.0–5.0)
HCT: 39.3 % (ref 36.0–46.0)
Hemoglobin: 13.2 g/dL (ref 12.0–15.0)
Lymphocytes Relative: 35.6 % (ref 12.0–46.0)
Lymphs Abs: 2.7 10*3/uL (ref 0.7–4.0)
MCHC: 33.6 g/dL (ref 30.0–36.0)
MCV: 92.1 fl (ref 78.0–100.0)
Monocytes Absolute: 0.5 10*3/uL (ref 0.1–1.0)
Monocytes Relative: 6.6 % (ref 3.0–12.0)
Neutro Abs: 4.4 10*3/uL (ref 1.4–7.7)
Neutrophils Relative %: 56.8 % (ref 43.0–77.0)
Platelets: 146 10*3/uL — ABNORMAL LOW (ref 150.0–400.0)
RBC: 4.26 Mil/uL (ref 3.87–5.11)
RDW: 14.1 % (ref 11.5–14.6)
WBC: 7.7 10*3/uL (ref 4.5–10.5)

## 2011-05-31 LAB — BASIC METABOLIC PANEL
BUN: 11 mg/dL (ref 6–23)
CO2: 29 mEq/L (ref 19–32)
Calcium: 9.1 mg/dL (ref 8.4–10.5)
Chloride: 96 mEq/L (ref 96–112)
Creatinine, Ser: 0.5 mg/dL (ref 0.4–1.2)
GFR: 136.31 mL/min (ref >60.00)
Glucose, Bld: 115 mg/dL — ABNORMAL HIGH (ref 70–99)
Potassium: 3.5 mEq/L (ref 3.5–5.1)
Sodium: 138 mEq/L (ref 135–145)

## 2011-05-31 NOTE — Patient Instructions (Signed)
This is more consistent w/ a strained muscle than diverticulitis Continue to use heat Alternate tylenol and ibuprofen for pain relief We'll notify you of your lab results Call with any questions or concerns Hang in there!

## 2011-05-31 NOTE — Assessment & Plan Note (Signed)
Pt's pain is not consistent w/ diverticulitis- worse w/ movement, improves w/ heat and lying down.  Will check labs to r/o infxn or electrolyte abnormality.  Suspect pt's pain is due to abdominal strain.  Continue heat, NSAIDs prn.  Reviewed supportive care and red flags that should prompt return.  Pt expressed understanding and is in agreement w/ plan.

## 2011-05-31 NOTE — Progress Notes (Signed)
  Subjective:    Patient ID: Rachael Buck, female    DOB: 1940/12/16, 70 y.o.   MRN: [id number]  HPI abd pain- sxs started on Thursday after working all day at [employer].  Located on L side.  relief w/ heat.  No pain w/ palpation, nausea.  + diarrhea- no blood.  Nothing worsens pain.  No fevers.  No known sick contacts.  Pt reports 'it feels like everything is going to fall out' when she stands up.  No pain w/ lying down.  Pain is worse w/ movement.   Review of Systems For ROS see HPI     Objective:   Physical Exam  Constitutional: She appears well-developed and well-nourished. No distress.  HENT:  Head: Normocephalic and atraumatic.  Neck: Normal range of motion. Neck supple.  Cardiovascular: Normal rate, regular rhythm and intact distal pulses.   Pulmonary/Chest: Effort normal and breath sounds normal. No respiratory distress. She has no wheezes. She has no rales.  Abdominal: Soft. She exhibits no distension. There is tenderness (mild LLQ tenderness w/ deep palpation). There is no rebound and no guarding.       Hyperactive BS  Lymphadenopathy:    She has no cervical adenopathy.          Assessment & Plan:

## 2011-06-01 ENCOUNTER — Telehealth: Payer: Self-pay

## 2011-06-01 NOTE — Progress Notes (Signed)
Quick Note:  Pt aware ______ 

## 2011-06-01 NOTE — Telephone Encounter (Signed)
Message copied by Beverely Low on Tue Jun 01, 2011 11:46 AM ------      Message from: Sheliah Hatch      Created: Mon May 31, 2011  8:37 PM       No evidence of infxn- this is great news!  Please call her and let her know

## 2011-06-01 NOTE — Telephone Encounter (Signed)
Pt aware.

## 2011-06-09 ENCOUNTER — Telehealth: Payer: Self-pay

## 2011-06-09 NOTE — Telephone Encounter (Signed)
Pt states that she is still having abdominal pain and is going to see Dr. Kinnie Scales at [facility]. She is requesting that her recent office note and lab results be faxed to Dr. Jennye Boroughs office at fax # [phone].  Fax sent

## 2011-06-15 ENCOUNTER — Other Ambulatory Visit: Payer: Self-pay | Admitting: Gastroenterology

## 2011-06-15 DIAGNOSIS — K589 Irritable bowel syndrome without diarrhea: Secondary | ICD-10-CM

## 2011-06-15 DIAGNOSIS — R1032 Left lower quadrant pain: Secondary | ICD-10-CM

## 2011-06-15 DIAGNOSIS — E669 Obesity, unspecified: Secondary | ICD-10-CM

## 2011-06-16 ENCOUNTER — Ambulatory Visit
Admission: RE | Admit: 2011-06-16 | Discharge: 2011-06-16 | Disposition: A | Discharge status: Home / Self Care | Payer: Medicare Other | Source: Ambulatory Visit | Attending: Gastroenterology | Admitting: Gastroenterology

## 2011-06-16 DIAGNOSIS — E669 Obesity, unspecified: Secondary | ICD-10-CM

## 2011-06-16 DIAGNOSIS — K589 Irritable bowel syndrome without diarrhea: Secondary | ICD-10-CM

## 2011-06-16 DIAGNOSIS — R1032 Left lower quadrant pain: Secondary | ICD-10-CM

## 2011-06-16 MED ORDER — IOHEXOL 300 MG/ML  SOLN: 125.0000 mL | Freq: Once | INTRAMUSCULAR | Status: AC | PRN

## 2011-06-24 ENCOUNTER — Encounter: Payer: Self-pay | Admitting: Family Medicine

## 2011-06-24 ENCOUNTER — Ambulatory Visit (INDEPENDENT_AMBULATORY_CARE_PROVIDER_SITE_OTHER): Payer: Medicare Other | Admitting: Family Medicine

## 2011-06-24 DIAGNOSIS — I10 Essential (primary) hypertension: Secondary | ICD-10-CM

## 2011-06-24 DIAGNOSIS — E119 Type 2 diabetes mellitus without complications: Secondary | ICD-10-CM

## 2011-06-24 DIAGNOSIS — E782 Mixed hyperlipidemia: Secondary | ICD-10-CM

## 2011-06-24 LAB — LIPID PANEL
Cholesterol: 143 mg/dL (ref 0–200)
HDL: 52.9 mg/dL (ref >39.00)
Total CHOL/HDL Ratio: 3
Triglycerides: 226 mg/dL — ABNORMAL HIGH (ref 0.0–149.0)
VLDL: 45.2 mg/dL — ABNORMAL HIGH (ref 0.0–40.0)

## 2011-06-24 LAB — BASIC METABOLIC PANEL
BUN: 11 mg/dL (ref 6–23)
CO2: 30 mEq/L (ref 19–32)
Calcium: 9.3 mg/dL (ref 8.4–10.5)
Chloride: 95 mEq/L — ABNORMAL LOW (ref 96–112)
Creatinine, Ser: 0.6 mg/dL (ref 0.4–1.2)
GFR: 103.36 mL/min (ref >60.00)
Glucose, Bld: 189 mg/dL — ABNORMAL HIGH (ref 70–99)
Potassium: 3.7 mEq/L (ref 3.5–5.1)
Sodium: 137 mEq/L (ref 135–145)

## 2011-06-24 LAB — LDL CHOLESTEROL, DIRECT: Direct LDL: 69.7 mg/dL

## 2011-06-24 LAB — HEPATIC FUNCTION PANEL
ALT: 41 U/L — ABNORMAL HIGH (ref 0–35)
AST: 62 U/L — ABNORMAL HIGH (ref 0–37)
Albumin: 4.3 g/dL (ref 3.5–5.2)
Alkaline Phosphatase: 66 U/L (ref 39–117)
Bilirubin, Direct: 0.1 mg/dL (ref 0.0–0.3)
Total Bilirubin: 1.1 mg/dL (ref 0.3–1.2)
Total Protein: 8 g/dL (ref 6.0–8.3)

## 2011-06-24 LAB — HEMOGLOBIN A1C: Hgb A1c MFr Bld: 7.1 % — ABNORMAL HIGH (ref 4.6–6.5)

## 2011-06-24 NOTE — Patient Instructions (Signed)
Follow up in 3 months to recheck diabetes- you can eat before this appt You look great!  Keep up the good work!! [employer] notify you of your lab results and make any changes if needed Try and exercise the lungs- BABY STEPS!!! Use the inhalers as needed Call with any questions or concerns Happy Holidays!!!

## 2011-06-24 NOTE — Progress Notes (Signed)
  Subjective:    Patient ID: Rachael Buck, female    DOB: 1940-09-17, 70 y.o.   MRN: [id number]  HPI HTN- chronic problem, excellent control on HCTZ on Cozaar.  No CP, edema, HAs, visual changes  Hyperlipidemia- chronic problem, on Zocor.  Denies abd pain, N/V, myalgias.  DM- chronic problem, A1C was 7 3 months ago.  On Metformin.  Started Weight [employer] yesterday.  No symptomatic lows.  Asymptomatic.   Review of Systems For ROS see HPI     Objective:   Physical Exam  Vitals reviewed. Constitutional: She is oriented to person, place, and time. She appears well-developed and well-nourished. No distress.  HENT:  Head: Normocephalic and atraumatic.  Eyes: Conjunctivae and EOM are normal. Pupils are equal, round, and reactive to light.  Neck: Normal range of motion. Neck supple. No thyromegaly present.  Cardiovascular: Normal rate, regular rhythm, normal heart sounds and intact distal pulses.   No murmur heard. Pulmonary/Chest: Effort normal and breath sounds normal. No respiratory distress.  Abdominal: Soft. She exhibits no distension. There is no tenderness.  Musculoskeletal: She exhibits no edema.  Lymphadenopathy:    She has no cervical adenopathy.  Neurological: She is alert and oriented to person, place, and time.  Skin: Skin is warm and dry.  Psychiatric: She has a normal mood and affect. Her behavior is normal.          Assessment & Plan:

## 2011-06-26 NOTE — Assessment & Plan Note (Signed)
Tolerating statin w/out difficulty.  Check labs.  Adjust meds prn. 

## 2011-06-26 NOTE — Assessment & Plan Note (Signed)
Check A1C.  Adjust meds prn.  Applauded pt's decision to join weight watchers.  Will monitor progress w/ her.

## 2011-06-26 NOTE — Assessment & Plan Note (Signed)
BP well controlled.  Asymptomatic.  No changes.

## 2011-07-07 ENCOUNTER — Other Ambulatory Visit: Payer: Self-pay | Admitting: Family Medicine

## 2011-07-07 DIAGNOSIS — Z0000 Encounter for general adult medical examination without abnormal findings: Secondary | ICD-10-CM

## 2011-07-08 ENCOUNTER — Other Ambulatory Visit (INDEPENDENT_AMBULATORY_CARE_PROVIDER_SITE_OTHER): Payer: Medicare Other

## 2011-07-08 DIAGNOSIS — Z0000 Encounter for general adult medical examination without abnormal findings: Secondary | ICD-10-CM

## 2011-07-08 LAB — AST: AST: 48 U/L — ABNORMAL HIGH (ref 0–37)

## 2011-07-08 LAB — ALT: ALT: 41 U/L — ABNORMAL HIGH (ref 0–35)

## 2011-07-08 NOTE — Progress Notes (Signed)
12  

## 2011-07-15 ENCOUNTER — Other Ambulatory Visit: Payer: Self-pay | Admitting: Family Medicine

## 2011-07-15 NOTE — Telephone Encounter (Signed)
Last OV 06-24-11 last refill 03-23-11

## 2011-07-16 MED ORDER — ALPRAZOLAM 0.5 MG PO TABS: 0.5000 mg | ORAL_TABLET | Freq: Three times a day (TID) | ORAL | Status: DC | PRN

## 2011-07-16 NOTE — Telephone Encounter (Signed)
Manually faxed rx to pharmacy 

## 2011-07-16 NOTE — Telephone Encounter (Signed)
[place] for #90, 1 refill 

## 2011-08-26 ENCOUNTER — Other Ambulatory Visit: Payer: Self-pay | Admitting: Family Medicine

## 2011-08-26 MED ORDER — SERTRALINE HCL 50 MG PO TABS: 50.0000 mg | ORAL_TABLET | Freq: Every day | ORAL | Status: DC

## 2011-08-26 NOTE — Telephone Encounter (Signed)
Ok for 6 refills 

## 2011-08-26 NOTE — Telephone Encounter (Signed)
Last OV 06-24-11 last refill for zoloft 03-23-11 #30 with 2 refills

## 2011-08-26 NOTE — Telephone Encounter (Signed)
rx sent to pharmacy by e-script  

## 2011-09-15 ENCOUNTER — Telehealth: Payer: Self-pay | Admitting: *Deleted

## 2011-09-15 NOTE — Telephone Encounter (Signed)
Discuss with patient  

## 2011-09-15 NOTE — Telephone Encounter (Signed)
Pt indicated that BS was 167 fasting today . Pt states that BS are running 150 to higher fasting. Pt notes that she have been taking 2 tabs of the metformin 500 mg for the past 2 days but BS are still elevated. Pt c/o headache, and some dizziness. Pt is concern that taking the addition tabs will effect her A1c reading. Please advise

## 2011-09-15 NOTE — Telephone Encounter (Signed)
Can increase Metformin to 2 tabs twice daily.  Needs to pay close attention to diet.  If she is fighting an infection this will also elevate blood sugars.  If they remain high she will need appt.

## 2011-09-24 ENCOUNTER — Ambulatory Visit (INDEPENDENT_AMBULATORY_CARE_PROVIDER_SITE_OTHER): Payer: Medicare Other | Admitting: Family Medicine

## 2011-09-24 ENCOUNTER — Encounter: Payer: Self-pay | Admitting: Family Medicine

## 2011-09-24 VITALS — BP 107/70 | HR 96 | Temp 98.5°F | Ht 65.25 in | Wt 229.4 lb

## 2011-09-24 DIAGNOSIS — E119 Type 2 diabetes mellitus without complications: Secondary | ICD-10-CM

## 2011-09-24 LAB — BASIC METABOLIC PANEL
BUN: 10 mg/dL (ref 6–23)
CO2: 31 mEq/L (ref 19–32)
Calcium: 9.2 mg/dL (ref 8.4–10.5)
Chloride: 95 mEq/L — ABNORMAL LOW (ref 96–112)
Creatinine, Ser: 0.5 mg/dL (ref 0.4–1.2)
GFR: 146.71 mL/min (ref >60.00)
Glucose, Bld: 112 mg/dL — ABNORMAL HIGH (ref 70–99)
Potassium: 3.7 mEq/L (ref 3.5–5.1)
Sodium: 136 mEq/L (ref 135–145)

## 2011-09-24 LAB — HEMOGLOBIN A1C: Hgb A1c MFr Bld: 8.3 % — ABNORMAL HIGH (ref 4.6–6.5)

## 2011-09-24 MED ORDER — OMEPRAZOLE 40 MG PO CPDR: 40.0000 mg | DELAYED_RELEASE_CAPSULE | Freq: Every day | ORAL | Status: DC

## 2011-09-24 MED ORDER — LOSARTAN POTASSIUM 50 MG PO TABS: 50.0000 mg | ORAL_TABLET | Freq: Every day | ORAL | Status: DC

## 2011-09-24 MED ORDER — ONETOUCH DELICA LANCETS FINE MISC: 1.0000 | Freq: Two times a day (BID) | Status: DC

## 2011-09-24 MED ORDER — HYDROCHLOROTHIAZIDE 25 MG PO TABS: 25.0000 mg | ORAL_TABLET | Freq: Every day | ORAL | Status: DC

## 2011-09-24 MED ORDER — SERTRALINE HCL 50 MG PO TABS: 50.0000 mg | ORAL_TABLET | Freq: Every day | ORAL | Status: DC

## 2011-09-24 MED ORDER — METFORMIN HCL 500 MG PO TABS: 1000.0000 mg | ORAL_TABLET | Freq: Two times a day (BID) | ORAL | Status: DC

## 2011-09-24 MED ORDER — GLUCOSE BLOOD VI STRP: ORAL_STRIP | Status: AC

## 2011-09-24 MED ORDER — SIMVASTATIN 80 MG PO TABS: 80.0000 mg | ORAL_TABLET | Freq: Every day | ORAL | Status: DC

## 2011-09-24 NOTE — Patient Instructions (Signed)
Follow up in 3 months to recheck diabetes and cholesterol- don't eat before this appt We'll notify you of your lab results and make any changes if needed Call with any questions or concerns Drive [facility]!

## 2011-09-24 NOTE — Progress Notes (Signed)
  Subjective:    Patient ID: Rachael Buck, female    DOB: [birth year], 71 y.o.   MRN: [id number]  HPI DM- chronic problem.  Having difficulty taking 2 metformin in the AM b/c of diarrhea.  Able to take 1 in the AM and 1 at noon and then 2 at night.  CBGs running 130-160s fasting and 120-200 post prandial.  Is feeling better now that sugars are lower.  'my toes don't tingle anymore'.  No CP, SOB, HAs, edema.   Review of Systems For ROS see HPI     Objective:   Physical Exam  Constitutional: She is oriented to person, place, and time. She appears well-developed and well-nourished. No distress.  HENT:  Head: Normocephalic and atraumatic.  Eyes: Conjunctivae and EOM are normal. Pupils are equal, round, and reactive to light.  Neck: Normal range of motion. Neck supple. No thyromegaly present.  Cardiovascular: Normal rate, regular rhythm, normal heart sounds and intact distal pulses.   No murmur heard. Pulmonary/Chest: Effort normal and breath sounds normal. No respiratory distress.  Abdominal: Soft. She exhibits no distension. There is no tenderness.  Musculoskeletal: She exhibits no edema.  Lymphadenopathy:    She has no cervical adenopathy.  Neurological: She is alert and oriented to person, place, and time.  Skin: Skin is warm and dry.  Psychiatric: She has a normal mood and affect. Her behavior is normal.          Assessment & Plan:

## 2011-10-03 ENCOUNTER — Encounter: Payer: Self-pay | Admitting: Family Medicine

## 2011-10-03 NOTE — Assessment & Plan Note (Signed)
Pt reports CBGs are better controlled than previous.  Unable to take 2 metformin in the AM due to diarrhea but is splitting them up AM and noon.  UTD on eye exam.  Check labs.  Adjust meds prn

## 2011-10-14 ENCOUNTER — Telehealth: Payer: Self-pay | Admitting: Family Medicine

## 2011-10-14 MED ORDER — ALPRAZOLAM 0.5 MG PO TABS: 0.5000 mg | ORAL_TABLET | Freq: Three times a day (TID) | ORAL | Status: DC | PRN

## 2011-10-14 NOTE — Telephone Encounter (Signed)
Last OV 09-24-11 last refill 07-16-11 #90 with 1 refill

## 2011-10-14 NOTE — Telephone Encounter (Signed)
[place] for #90, 1 refill

## 2011-10-14 NOTE — Telephone Encounter (Signed)
Refill-alprazolam 0.51m tablet. Take one tablet by mouth three times a day as needed for sleep or anxiety.

## 2011-10-14 NOTE — Telephone Encounter (Signed)
.  rx faxed to pharmacy, manually. For #90 plus 1 refill

## 2011-10-29 ENCOUNTER — Encounter: Payer: Self-pay | Admitting: Family Medicine

## 2011-11-29 ENCOUNTER — Other Ambulatory Visit: Payer: Self-pay | Admitting: Gynecology

## 2011-11-29 DIAGNOSIS — Z1231 Encounter for screening mammogram for malignant neoplasm of breast: Secondary | ICD-10-CM

## 2011-12-08 ENCOUNTER — Ambulatory Visit
Admission: RE | Admit: 2011-12-08 | Discharge: 2011-12-08 | Disposition: A | Discharge status: Home / Self Care | Payer: Medicare Other | Source: Ambulatory Visit | Attending: Gynecology | Admitting: Gynecology

## 2011-12-08 DIAGNOSIS — Z1231 Encounter for screening mammogram for malignant neoplasm of breast: Secondary | ICD-10-CM

## 2011-12-21 ENCOUNTER — Ambulatory Visit (INDEPENDENT_AMBULATORY_CARE_PROVIDER_SITE_OTHER): Payer: Medicare Other | Admitting: Family Medicine

## 2011-12-21 ENCOUNTER — Encounter: Payer: Self-pay | Admitting: Family Medicine

## 2011-12-21 VITALS — BP 124/84 | HR 100 | Temp 98.5°F | Ht 66.0 in | Wt 226.0 lb

## 2011-12-21 DIAGNOSIS — I10 Essential (primary) hypertension: Secondary | ICD-10-CM | POA: Insufficient documentation

## 2011-12-21 DIAGNOSIS — E1165 Type 2 diabetes mellitus with hyperglycemia: Secondary | ICD-10-CM

## 2011-12-21 DIAGNOSIS — E782 Mixed hyperlipidemia: Secondary | ICD-10-CM | POA: Diagnosis not present

## 2011-12-21 DIAGNOSIS — IMO0001 Reserved for inherently not codable concepts without codable children: Secondary | ICD-10-CM | POA: Diagnosis not present

## 2011-12-21 LAB — BASIC METABOLIC PANEL
BUN: 9 mg/dL (ref 6–23)
CO2: 31 mEq/L (ref 19–32)
Calcium: 9.4 mg/dL (ref 8.4–10.5)
Chloride: 96 mEq/L (ref 96–112)
Creatinine, Ser: 0.5 mg/dL (ref 0.4–1.2)
GFR: 136.09 mL/min (ref >60.00)
Glucose, Bld: 237 mg/dL — ABNORMAL HIGH (ref 70–99)
Potassium: 3.6 mEq/L (ref 3.5–5.1)
Sodium: 137 mEq/L (ref 135–145)

## 2011-12-21 LAB — HEPATIC FUNCTION PANEL
ALT: 38 U/L — ABNORMAL HIGH (ref 0–35)
AST: 60 U/L — ABNORMAL HIGH (ref 0–37)
Albumin: 4.2 g/dL (ref 3.5–5.2)
Alkaline Phosphatase: 61 U/L (ref 39–117)
Bilirubin, Direct: 0.1 mg/dL (ref 0.0–0.3)
Total Bilirubin: 0.6 mg/dL (ref 0.3–1.2)
Total Protein: 7.7 g/dL (ref 6.0–8.3)

## 2011-12-21 LAB — LIPID PANEL
Cholesterol: 129 mg/dL (ref 0–200)
HDL: 52.8 mg/dL (ref >39.00)
LDL Cholesterol: 44 mg/dL (ref 0–99)
Total CHOL/HDL Ratio: 2
Triglycerides: 162 mg/dL — ABNORMAL HIGH (ref 0.0–149.0)
VLDL: 32.4 mg/dL (ref 0.0–40.0)

## 2011-12-21 LAB — TSH: TSH: 1.46 u[IU]/mL (ref 0.35–5.50)

## 2011-12-21 LAB — HEMOGLOBIN A1C: Hgb A1c MFr Bld: 7.3 % — ABNORMAL HIGH (ref 4.6–6.5)

## 2011-12-21 NOTE — Assessment & Plan Note (Signed)
Chronic problem.  On statin w/out difficulty.  Due for labs.

## 2011-12-21 NOTE — Assessment & Plan Note (Signed)
Chronic problem.  Pt compliant w/ meds.  Denies symptomatic lows.  UTD on eye exam.  Has lost 5 lbs since last visit- attempting to better follow ADA diet.  Check labs.  Adjust meds prn.

## 2011-12-21 NOTE — Patient Instructions (Signed)
Follow up in 3 months to recheck diabetes We'll notify you of your lab results and make any changes if needed Call with any questions or concerns Hang in there! 

## 2011-12-21 NOTE — Assessment & Plan Note (Signed)
Chronic problem.  Well controlled on current meds.  Asymptomatic.  No changes.

## 2011-12-21 NOTE — Progress Notes (Signed)
  Subjective:    Patient ID: Rachael Buck, female    DOB: 09/03/1940, 71 y.o.   MRN: [id number]  HPI HTN- chronic problem, well controlled on HCTZ, Cozaar.  No CP, HAs, visual changes, edema.  DM- chronic problem, due for labs.  On Metformin.  Reports sugars have been 'pretty good'.  UTD on eye exam.  No symptomatic lows.  No N/V/D.  Denies numbness or tingling of hands/feet.  Hyperlipidemia- chronic problem, on Zocor.  Due for labs.  Denies abd pain, N/V, myalias.   Review of Systems For ROS see HPI     Objective:   Physical Exam  Vitals reviewed. Constitutional: She is oriented to person, place, and time. She appears well-developed and well-nourished. No distress.  HENT:  Head: Normocephalic and atraumatic.  Eyes: Conjunctivae and EOM are normal. Pupils are equal, round, and reactive to light.  Neck: Normal range of motion. Neck supple. No thyromegaly present.  Cardiovascular: Normal rate, regular rhythm, normal heart sounds and intact distal pulses.   No murmur heard. Pulmonary/Chest: Effort normal and breath sounds normal. No respiratory distress.  Abdominal: Soft. She exhibits no distension. There is no tenderness.  Musculoskeletal: She exhibits no edema.  Lymphadenopathy:    She has no cervical adenopathy.  Neurological: She is alert and oriented to person, place, and time.  Skin: Skin is warm and dry.  Psychiatric: She has a normal mood and affect. Her behavior is normal.          Assessment & Plan:

## 2011-12-23 ENCOUNTER — Encounter: Payer: Self-pay | Admitting: *Deleted

## 2012-01-11 ENCOUNTER — Other Ambulatory Visit: Payer: Self-pay | Admitting: Family Medicine

## 2012-01-11 NOTE — Telephone Encounter (Signed)
refill Alprazolam 0.5MG Tablet Take one tablet by mouth three times a day as needed for sleep or anxiety Last written 2.14.13, qty 90 Last OV 4.23.13

## 2012-01-12 NOTE — Telephone Encounter (Signed)
[place] for #90, 1 refill

## 2012-01-12 NOTE — Telephone Encounter (Signed)
Last OV 12-21-11, last filled 10-14-11 #90 1

## 2012-01-13 MED ORDER — ALPRAZOLAM 0.5 MG PO TABS: 0.5000 mg | ORAL_TABLET | Freq: Three times a day (TID) | ORAL | Status: DC | PRN

## 2012-01-13 NOTE — Telephone Encounter (Signed)
Refill done.  

## 2012-01-17 ENCOUNTER — Emergency Department (HOSPITAL_BASED_OUTPATIENT_CLINIC_OR_DEPARTMENT_OTHER): Payer: Medicare Other

## 2012-01-17 ENCOUNTER — Encounter (HOSPITAL_BASED_OUTPATIENT_CLINIC_OR_DEPARTMENT_OTHER): Payer: Self-pay

## 2012-01-17 ENCOUNTER — Emergency Department (HOSPITAL_BASED_OUTPATIENT_CLINIC_OR_DEPARTMENT_OTHER)
Admission: EM | Admit: 2012-01-17 | Discharge: 2012-01-17 | Disposition: A | Discharge status: Home / Self Care | Payer: Medicare Other | Attending: Emergency Medicine | Admitting: Emergency Medicine

## 2012-01-17 DIAGNOSIS — K5732 Diverticulitis of large intestine without perforation or abscess without bleeding: Secondary | ICD-10-CM | POA: Diagnosis not present

## 2012-01-17 DIAGNOSIS — E119 Type 2 diabetes mellitus without complications: Secondary | ICD-10-CM | POA: Diagnosis not present

## 2012-01-17 DIAGNOSIS — R1032 Left lower quadrant pain: Secondary | ICD-10-CM | POA: Diagnosis not present

## 2012-01-17 DIAGNOSIS — J45909 Unspecified asthma, uncomplicated: Secondary | ICD-10-CM | POA: Insufficient documentation

## 2012-01-17 DIAGNOSIS — K219 Gastro-esophageal reflux disease without esophagitis: Secondary | ICD-10-CM | POA: Diagnosis not present

## 2012-01-17 DIAGNOSIS — Z79899 Other long term (current) drug therapy: Secondary | ICD-10-CM | POA: Insufficient documentation

## 2012-01-17 DIAGNOSIS — M129 Arthropathy, unspecified: Secondary | ICD-10-CM | POA: Diagnosis not present

## 2012-01-17 DIAGNOSIS — R110 Nausea: Secondary | ICD-10-CM | POA: Diagnosis not present

## 2012-01-17 DIAGNOSIS — I10 Essential (primary) hypertension: Secondary | ICD-10-CM | POA: Insufficient documentation

## 2012-01-17 DIAGNOSIS — K5792 Diverticulitis of intestine, part unspecified, without perforation or abscess without bleeding: Secondary | ICD-10-CM

## 2012-01-17 LAB — COMPREHENSIVE METABOLIC PANEL
ALT: 33 U/L (ref 0–35)
AST: 46 U/L — ABNORMAL HIGH (ref 0–37)
Albumin: 4.2 g/dL (ref 3.5–5.2)
Alkaline Phosphatase: 69 U/L (ref 39–117)
BUN: 9 mg/dL (ref 6–23)
CO2: 29 mEq/L (ref 19–32)
Calcium: 9.5 mg/dL (ref 8.4–10.5)
Chloride: 92 mEq/L — ABNORMAL LOW (ref 96–112)
Creatinine, Ser: 0.4 mg/dL — ABNORMAL LOW (ref 0.50–1.10)
GFR calc Af Amer: >90 mL/min (ref >90)
GFR calc non Af Amer: >90 mL/min (ref >90)
Glucose, Bld: 180 mg/dL — ABNORMAL HIGH (ref 70–99)
Potassium: 3.5 mEq/L (ref 3.5–5.1)
Sodium: 134 mEq/L — ABNORMAL LOW (ref 135–145)
Total Bilirubin: 1 mg/dL (ref 0.3–1.2)
Total Protein: 7.8 g/dL (ref 6.0–8.3)

## 2012-01-17 LAB — URINALYSIS, ROUTINE W REFLEX MICROSCOPIC
Bilirubin Urine: NEGATIVE
Glucose, UA: NEGATIVE mg/dL
Ketones, ur: NEGATIVE mg/dL
Leukocytes, UA: NEGATIVE
Nitrite: NEGATIVE
Protein, ur: >300 mg/dL — AB
Specific Gravity, Urine: 1.016 (ref 1.005–1.030)
Urobilinogen, UA: 1 mg/dL (ref 0.0–1.0)
pH: 7 (ref 5.0–8.0)

## 2012-01-17 LAB — CBC
HCT: 38 % (ref 36.0–46.0)
Hemoglobin: 13.7 g/dL (ref 12.0–15.0)
MCH: 30.5 pg (ref 26.0–34.0)
MCHC: 36.1 g/dL — ABNORMAL HIGH (ref 30.0–36.0)
MCV: 84.6 fL (ref 78.0–100.0)
Platelets: 126 10*3/uL — ABNORMAL LOW (ref 150–400)
RBC: 4.49 MIL/uL (ref 3.87–5.11)
RDW: 13.9 % (ref 11.5–15.5)
WBC: 7.7 10*3/uL (ref 4.0–10.5)

## 2012-01-17 LAB — DIFFERENTIAL
Basophils Absolute: 0 10*3/uL (ref 0.0–0.1)
Basophils Relative: 0 % (ref 0–1)
Eosinophils Absolute: 0 10*3/uL (ref 0.0–0.7)
Eosinophils Relative: 1 % (ref 0–5)
Lymphocytes Relative: 24 % (ref 12–46)
Lymphs Abs: 1.9 10*3/uL (ref 0.7–4.0)
Monocytes Absolute: 0.4 10*3/uL (ref 0.1–1.0)
Monocytes Relative: 6 % (ref 3–12)
Neutro Abs: 5.3 10*3/uL (ref 1.7–7.7)
Neutrophils Relative %: 69 % (ref 43–77)

## 2012-01-17 LAB — URINE MICROSCOPIC-ADD ON

## 2012-01-17 LAB — TROPONIN I: Troponin I: <0.3 ng/mL (ref <0.30)

## 2012-01-17 LAB — LIPASE, BLOOD: Lipase: 33 U/L (ref 11–59)

## 2012-01-17 MED ORDER — ONDANSETRON HCL 4 MG/2ML IJ SOLN
4.0000 mg | Freq: Once | INTRAMUSCULAR | Status: AC
  Administered 2012-01-17: 4 mg via INTRAVENOUS

## 2012-01-17 MED ORDER — IOHEXOL 300 MG/ML  SOLN
36.0000 mL | Freq: Once | INTRAMUSCULAR | Status: AC | PRN
  Administered 2012-01-17: 36 mL via INTRAVENOUS

## 2012-01-17 MED ORDER — HYDROMORPHONE HCL 4 MG PO TABS: 4.0000 mg | ORAL_TABLET | ORAL | Status: AC | PRN

## 2012-01-17 MED ORDER — IOHEXOL 300 MG/ML  SOLN
100.0000 mL | Freq: Once | INTRAMUSCULAR | Status: AC | PRN
  Administered 2012-01-17: 100 mL via INTRAVENOUS

## 2012-01-17 MED ORDER — HYDROMORPHONE HCL PF 1 MG/ML IJ SOLN
INTRAMUSCULAR | Status: AC
  Filled 2012-01-17: qty 1

## 2012-01-17 MED ORDER — SODIUM CHLORIDE 0.9 % IV BOLUS (SEPSIS)
1000.0000 mL | Freq: Once | INTRAVENOUS | Status: AC
  Administered 2012-01-17: 1000 mL via INTRAVENOUS

## 2012-01-17 MED ORDER — CIPROFLOXACIN HCL 500 MG PO TABS: 500.0000 mg | ORAL_TABLET | Freq: Two times a day (BID) | ORAL | Status: DC

## 2012-01-17 MED ORDER — ONDANSETRON HCL 4 MG/2ML IJ SOLN
4.0000 mg | Freq: Once | INTRAMUSCULAR | Status: AC
  Administered 2012-01-17: 4 mg via INTRAVENOUS
  Filled 2012-01-17: qty 2

## 2012-01-17 MED ORDER — ONDANSETRON HCL 4 MG/2ML IJ SOLN
INTRAMUSCULAR | Status: AC
  Filled 2012-01-17: qty 2

## 2012-01-17 MED ORDER — METRONIDAZOLE 500 MG PO TABS: 500.0000 mg | ORAL_TABLET | Freq: Two times a day (BID) | ORAL | Status: DC

## 2012-01-17 MED ORDER — HYDROMORPHONE HCL PF 1 MG/ML IJ SOLN
1.0000 mg | Freq: Once | INTRAMUSCULAR | Status: AC
  Administered 2012-01-17: 1 mg via INTRAVENOUS

## 2012-01-17 MED ORDER — HYDROMORPHONE HCL PF 1 MG/ML IJ SOLN
1.0000 mg | Freq: Once | INTRAMUSCULAR | Status: AC
  Administered 2012-01-17: 1 mg via INTRAVENOUS
  Filled 2012-01-17: qty 1

## 2012-01-17 MED ORDER — ONDANSETRON 8 MG PO TBDP: ORAL_TABLET | ORAL | Status: AC

## 2012-01-17 MED ORDER — SODIUM CHLORIDE 0.9 % IV SOLN
INTRAVENOUS | Status: DC
  Administered 2012-01-17: 11:00:00 via INTRAVENOUS

## 2012-01-17 NOTE — Discharge Instructions (Signed)
Diverticulitis A diverticulum is a small pouch or sac on the colon. Diverticulosis is the presence of these diverticula on the colon. Diverticulitis is the irritation (inflammation) or infection of diverticula. CAUSES  The colon and its diverticula contain bacteria. If food particles block the tiny opening to a diverticulum, the bacteria inside can grow and cause an increase in pressure. This leads to infection and inflammation and is called diverticulitis. SYMPTOMS   Abdominal pain and tenderness. Usually, the pain is located on the left side of your abdomen. However, it could be located elsewhere.   Fever.   Bloating.   Feeling sick to your stomach (nausea).   Throwing up (vomiting).   Abnormal stools.  DIAGNOSIS  Your caregiver will take a history and perform a physical exam. Since many things can cause abdominal pain, other tests may be necessary. Tests may include:  Blood tests.   Urine tests.   X-ray of the abdomen.   CT scan of the abdomen.  Sometimes, surgery is needed to determine if diverticulitis or other conditions are causing your symptoms. TREATMENT  Most of the time, you can be treated without surgery. Treatment includes:  Resting the bowels by only having liquids for a few days. As you improve, you will need to eat a low-fiber diet.   Intravenous (IV) fluids if you are losing body fluids (dehydrated).   Antibiotic medicines that treat infections may be given.   Pain and nausea medicine, if needed.   Surgery if the inflamed diverticulum has burst.  HOME CARE INSTRUCTIONS   Try a clear liquid diet (broth, tea, or water for as long as directed by your caregiver). You may then gradually begin a low-fiber diet as tolerated. A low-fiber diet is a diet with less than 10 grams of fiber. Choose the foods below to reduce fiber in the diet:   White breads, cereals, rice, and pasta.   Cooked fruits and vegetables or soft fresh fruits and vegetables without the skin.     Ground or well-cooked tender beef, ham, veal, lamb, pork, or poultry.   Eggs and seafood.   After your diverticulitis symptoms have improved, your caregiver may put you on a high-fiber diet. A high-fiber diet includes 14 grams of fiber for every 1000 calories consumed. For a standard 2000 calorie diet, you would need 28 grams of fiber. Follow these diet guidelines to help you increase the fiber in your diet. It is important to slowly increase the amount fiber in your diet to avoid gas, constipation, and bloating.   Choose whole-grain breads, cereals, pasta, and brown rice.   Choose fresh fruits and vegetables with the skin on. Do not overcook vegetables because the more vegetables are cooked, the more fiber is lost.   Choose more nuts, seeds, legumes, dried peas, beans, and lentils.   Look for food products that have greater than 3 grams of fiber per serving on the Nutrition Facts label.   Take all medicine as directed by your caregiver.   If your caregiver has given you a follow-up appointment, it is very important that you go. Not going could result in lasting (chronic) or permanent injury, pain, and disability. If there is any problem keeping the appointment, call to reschedule.  SEEK MEDICAL CARE IF:   Your pain does not improve.   You have a hard time advancing your diet beyond clear liquids.   Your bowel movements do not return to normal.  SEEK IMMEDIATE MEDICAL CARE IF:   Your pain becomes  uncontrolled.  You have an oral temperature above 101 F  not controlled by medicine.   You have repeated vomiting.   You have bloody or black, tarry stools.   Symptoms that brought you to your caregiver become worse or are not getting better.   Develop dizziness, chest pain, shortness of breath, confusion, or other concerns. MAKE SURE YOU:   Understand these instructions.   Will watch your condition.   Will get help right away if you are not doing well or get worse.  Document  Released: 05/26/2005 Document Revised: 08/05/2011 Document Reviewed: 09/21/2010 [facility] Patient Information 2012 [place].

## 2012-01-17 NOTE — ED Notes (Signed)
Pt returned from CT, no change in assessment. 

## 2012-01-17 NOTE — ED Notes (Signed)
Patient transported to CT 

## 2012-01-17 NOTE — ED Notes (Signed)
Pt reports onset of abdominal pain Friday progressively worsening over the weekend.

## 2012-01-17 NOTE — ED Notes (Signed)
Pt reports she took phenergan x 2 last night and last dose was 0330am

## 2012-01-17 NOTE — ED Provider Notes (Signed)
History     CSN: [id number]  Arrival date & time 01/17/12  2620   First MD Initiated Contact with Patient 01/17/12 [phone]      Chief Complaint  Patient presents with  . Abdominal Pain    (Consider location/radiation/quality/duration/timing/severity/associated sxs/prior treatment) HPI This 71 year old female has 4 days of left lower quadrant abdominal pain with nausea without vomiting. She has no chest pain cough or shortness of breath. She has no exertional pain. She is no colicky pain. She is well localized pain without radiation or associated symptoms other than nausea. She had a few small loose nonbloody stools today. She has no dysuria. She is no treatment prior to arrival. She does have diverticulosis but has not had diverticulitis. Her pain is moderately severe her pain is worse with position changes. Past Medical History  Diagnosis Date  . Arthritis   . Asthma   . Diabetes mellitus   . Diverticulitis   . GERD (gastroesophageal reflux disease)   . Hypertension   . Colon polyps     Past Surgical History  Procedure Date  . Cholecystectomy 1981  . Partial hysterectomy 1972  . Oophorectomy 1973  . Rotator cuff repair     bilateral. 2006, 2008    Family History  Problem Relation Age of Onset  . Breast cancer Daughter   . Breast cancer Maternal Aunt   . Diabetes Brother   . Diabetes Sister   . Stomach cancer Mother     History  Substance Use Topics  . Smoking status: Former Research scientist (life sciences)  . Smokeless tobacco: Not on file  . Alcohol Use: No    OB History    Grav Para Term Preterm Abortions TAB SAB Ect Mult Living                  Review of Systems  Constitutional: Negative for fever.       10 Systems reviewed and are negative for acute change except as noted in the HPI.  HENT: Negative for congestion.   Eyes: Negative for discharge and redness.  Respiratory: Negative for cough and shortness of breath.   Cardiovascular: Negative for chest pain.    Gastrointestinal: Positive for nausea and abdominal pain. Negative for vomiting and diarrhea.  Genitourinary: Negative for dysuria.  Musculoskeletal: Negative for back pain.  Skin: Negative for rash.  Neurological: Negative for syncope, numbness and headaches.  Psychiatric/Behavioral:       No behavior change.    Allergies  Codeine  Home Medications   Current Outpatient Rx  Name Route Sig Dispense Refill  . ALPRAZOLAM 0.5 MG PO TABS Oral Take 1 tablet (0.5 mg total) by mouth 3 (three) times daily as needed for sleep or anxiety. 90 tablet 1  . CIPROFLOXACIN HCL 500 MG PO TABS Oral Take 1 tablet (500 mg total) by mouth 2 (two) times daily. One po bid x 7 days 14 tablet 0  . DIGEX NF 7.4163-84 MG PO CAPS      . GLUCOSE BLOOD VI STRP  Use as instructed 100 each 12  . HYDROCHLOROTHIAZIDE 25 MG PO TABS Oral Take 1 tablet (25 mg total) by mouth daily. 90 tablet 3  . HYDROCHLOROTHIAZIDE 25 MG PO TABS Oral Take 1 tablet (25 mg total) by mouth daily. 90 tablet 3  . HYDROMORPHONE HCL 4 MG PO TABS Oral Take 1 tablet (4 mg total) by mouth every 4 (four) hours as needed for pain. 10 tablet 0  . IMODIUM PO Oral Take by mouth  as needed.      Marland Kitchen LOSARTAN POTASSIUM 50 MG PO TABS Oral Take 1 tablet (50 mg total) by mouth daily. 90 tablet 3  . METFORMIN HCL 500 MG PO TABS Oral Take 2 tablets (1,000 mg total) by mouth 2 (two) times daily with a meal. 90 tablet 3  . METRONIDAZOLE 500 MG PO TABS Oral Take 1 tablet (500 mg total) by mouth 3 (three) times daily. One po bid x 7 days 21 tablet 0  . OMEPRAZOLE 40 MG PO CPDR Oral Take 1 capsule (40 mg total) by mouth daily. 90 capsule 3  . ONDANSETRON 8 MG PO TBDP  48m ODT q4 hours prn nausea 4 tablet 0  . ONETOUCH DELICA LANCETS FINE MISC Does not apply 1 Stick by Does not apply route 2 (two) times daily. 100 each 3  . SERTRALINE HCL 50 MG PO TABS Oral Take 1 tablet (50 mg total) by mouth daily. 90 tablet 3  . SIMVASTATIN 80 MG PO TABS Oral Take 1 tablet (80 mg  total) by mouth daily. 90 tablet 3    BP 113/80  Pulse 87  Temp(Src) 97.8 F (36.6 C) (Oral)  Resp 16  Ht 5' 6"  (1.676 m)  Wt 220 lb (99.791 kg)  BMI 35.51 kg/m2  SpO2 94%  Physical Exam  Nursing note and vitals reviewed. Constitutional:       Awake, alert, nontoxic appearance.  HENT:  Head: Atraumatic.  Eyes: Right eye exhibits no discharge. Left eye exhibits no discharge.  Neck: Neck supple.  Cardiovascular: Normal rate and regular rhythm.   No murmur heard. Pulmonary/Chest: Effort normal and breath sounds normal. No respiratory distress. She has no wheezes. She has no rales. She exhibits no tenderness.  Abdominal: Soft. Bowel sounds are normal. She exhibits no mass. There is tenderness. There is guarding. There is no rebound.       Moderate left lower quadrant tenderness without rebound and the rest of the abdomen is nontender except for minimal tenderness to the left upper quadrant  Musculoskeletal: She exhibits no edema and no tenderness.       Baseline ROM, no obvious new focal weakness.  Neurological:       Mental status and motor strength appears baseline for patient and situation.  Skin: No rash noted.  Psychiatric: She has a normal mood and affect.    ED Course  Procedures (including critical care time)  Labs Reviewed  COMPREHENSIVE METABOLIC PANEL - Abnormal; Notable for the following:    Sodium 134 (*)    Chloride 92 (*)    Glucose, Bld 180 (*)    Creatinine, Ser 0.40 (*)    AST 46 (*)    All other components within normal limits  URINALYSIS, ROUTINE W REFLEX MICROSCOPIC - Abnormal; Notable for the following:    Hgb urine dipstick SMALL (*)    Protein, ur >300 (*)    All other components within normal limits  URINE MICROSCOPIC-ADD ON - Abnormal; Notable for the following:    Bacteria, UA FEW (*)    Casts GRANULAR CAST (*)    All other components within normal limits  CBC - Abnormal; Notable for the following:    MCHC 36.1 (*)    Platelets 126 (*)     All other components within normal limits  DIFFERENTIAL  LIPASE, BLOOD  TROPONIN I   No results found.   1. Diverticulitis       MDM  Pt stable in ED with no significant  deterioration in condition.Patient / Family / Caregiver informed of clinical course, understand medical decision-making process, and agree with plan.I doubt any other EMC precluding discharge at this time including, but not necessarily limited to the following:sepsis, peritonitis.        Babette Relic, MD 01/24/12 2207

## 2012-01-17 NOTE — ED Notes (Signed)
Pt medicated and PO contrast provided.

## 2012-01-17 NOTE — ED Notes (Signed)
Family at bedside. 

## 2012-01-17 NOTE — ED Notes (Signed)
MD at bedside. 

## 2012-01-19 ENCOUNTER — Telehealth: Payer: Self-pay

## 2012-01-19 MED ORDER — METRONIDAZOLE 500 MG PO TABS: 500.0000 mg | ORAL_TABLET | Freq: Three times a day (TID) | ORAL | Status: AC

## 2012-01-19 MED ORDER — CIPROFLOXACIN HCL 500 MG PO TABS: 500.0000 mg | ORAL_TABLET | Freq: Two times a day (BID) | ORAL | Status: AC

## 2012-01-19 NOTE — Telephone Encounter (Signed)
Called pt to advise extension of medication, pt advised if anything can be called in for the pain, advised there was no concern of pain noted, pt advised that she is still experiencing the stomach pain, MD Birdie Riddle advised to ask pt if she is still eating a clear liquid diet, pt advised that was only for one day, read pt the discharge summary from the ER stating she needs to intake a clear liquid diet until the pain goes away, pt notes she will go back to the clear liquid diet and pick up the ABT/Flagyl, noted sent vis escribe to verifed pharmacy

## 2012-01-19 NOTE — Telephone Encounter (Signed)
Message left on Triage VM: Patient was suppose to see Dr.Tabori this Friday, since patient can not be seen patient is requesting Dr.Tabori send in another weeks worth of medication for Diverticulitis. Patient was DX with Diverticulitis in ER. (No pharmacy location was given)

## 2012-01-19 NOTE — Telephone Encounter (Signed)
Ok to extend tx.  Cipro 576m BID x7 days.  Flagyl 5019mTID x7 days

## 2012-03-03 DIAGNOSIS — K5732 Diverticulitis of large intestine without perforation or abscess without bleeding: Secondary | ICD-10-CM | POA: Diagnosis not present

## 2012-03-03 DIAGNOSIS — K219 Gastro-esophageal reflux disease without esophagitis: Secondary | ICD-10-CM | POA: Diagnosis not present

## 2012-03-03 DIAGNOSIS — R197 Diarrhea, unspecified: Secondary | ICD-10-CM | POA: Diagnosis not present

## 2012-03-03 DIAGNOSIS — R1310 Dysphagia, unspecified: Secondary | ICD-10-CM | POA: Diagnosis not present

## 2012-03-13 DIAGNOSIS — R197 Diarrhea, unspecified: Secondary | ICD-10-CM | POA: Diagnosis not present

## 2012-03-13 DIAGNOSIS — Z86010 Personal history of colonic polyps: Secondary | ICD-10-CM | POA: Diagnosis not present

## 2012-03-13 DIAGNOSIS — K648 Other hemorrhoids: Secondary | ICD-10-CM | POA: Diagnosis not present

## 2012-03-13 DIAGNOSIS — K5730 Diverticulosis of large intestine without perforation or abscess without bleeding: Secondary | ICD-10-CM | POA: Diagnosis not present

## 2012-03-13 DIAGNOSIS — I8500 Esophageal varices without bleeding: Secondary | ICD-10-CM | POA: Diagnosis not present

## 2012-03-13 DIAGNOSIS — K219 Gastro-esophageal reflux disease without esophagitis: Secondary | ICD-10-CM | POA: Diagnosis not present

## 2012-03-13 DIAGNOSIS — K6389 Other specified diseases of intestine: Secondary | ICD-10-CM | POA: Diagnosis not present

## 2012-03-13 DIAGNOSIS — D131 Benign neoplasm of stomach: Secondary | ICD-10-CM | POA: Diagnosis not present

## 2012-03-13 DIAGNOSIS — D126 Benign neoplasm of colon, unspecified: Secondary | ICD-10-CM | POA: Diagnosis not present

## 2012-03-21 ENCOUNTER — Ambulatory Visit (INDEPENDENT_AMBULATORY_CARE_PROVIDER_SITE_OTHER): Payer: Medicare Other | Admitting: Family Medicine

## 2012-03-21 ENCOUNTER — Encounter: Payer: Self-pay | Admitting: Family Medicine

## 2012-03-21 VITALS — BP 123/78 | HR 97 | Temp 98.2°F | Ht 65.0 in | Wt 222.4 lb

## 2012-03-21 DIAGNOSIS — K7581 Nonalcoholic steatohepatitis (NASH): Secondary | ICD-10-CM | POA: Insufficient documentation

## 2012-03-21 DIAGNOSIS — E1165 Type 2 diabetes mellitus with hyperglycemia: Secondary | ICD-10-CM

## 2012-03-21 DIAGNOSIS — E669 Obesity, unspecified: Secondary | ICD-10-CM | POA: Diagnosis not present

## 2012-03-21 DIAGNOSIS — K7689 Other specified diseases of liver: Secondary | ICD-10-CM

## 2012-03-21 DIAGNOSIS — I8500 Esophageal varices without bleeding: Secondary | ICD-10-CM

## 2012-03-21 DIAGNOSIS — IMO0001 Reserved for inherently not codable concepts without codable children: Secondary | ICD-10-CM

## 2012-03-21 LAB — BASIC METABOLIC PANEL
BUN: 9 mg/dL (ref 6–23)
CO2: 31 mEq/L (ref 19–32)
Calcium: 9.3 mg/dL (ref 8.4–10.5)
Chloride: 94 mEq/L — ABNORMAL LOW (ref 96–112)
Creatinine, Ser: 0.4 mg/dL (ref 0.4–1.2)
GFR: 163.12 mL/min (ref >60.00)
Glucose, Bld: 170 mg/dL — ABNORMAL HIGH (ref 70–99)
Potassium: 3.3 mEq/L — ABNORMAL LOW (ref 3.5–5.1)
Sodium: 134 mEq/L — ABNORMAL LOW (ref 135–145)

## 2012-03-21 LAB — HEMOGLOBIN A1C: Hgb A1c MFr Bld: 7.3 % — ABNORMAL HIGH (ref 4.6–6.5)

## 2012-03-21 NOTE — Assessment & Plan Note (Signed)
Chronic problem.  Pt recently dx'd w/ NASH and told to lose 65 lbs.  Will refer to nutrition.

## 2012-03-21 NOTE — Assessment & Plan Note (Signed)
Chronic problem.  Tolerating metformin w/out difficulty.  Pt now attempting to lose weight.  Check labs.  Adjust meds prn

## 2012-03-21 NOTE — Assessment & Plan Note (Signed)
New.  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  GI did not feel need to start beta blocker at this time.  Will follow along and assist as able.

## 2012-03-21 NOTE — Patient Instructions (Addendum)
Follow up in 3 months to recheck diabetes and cholesterol- don't eat before this one Call and schedule your eye exam We'll call you with your nutrition appt Remember to focus on the small steps- 1-2 lbs a week, healthy food choices and regular exercise! You can do this!

## 2012-03-21 NOTE — Progress Notes (Signed)
  Subjective:    Patient ID: Rachael Buck, female    DOB: 15-Apr-1941, 71 y.o.   MRN: [id number]  HPI DM- chronic problem.  Currently on Metformin.  Is now in midst of weight loss.  CBGs have been 'pretty good'.  UTD on eye exam.  Rare symptomatic lows.  Denies CP, SOB, HAs, visual changes, edema.  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  Per report, varices are stable and he doesn't plan an intervention at this time.  Plan is to lose 65 lbs.   Review of Systems For ROS see HPI     Objective:   Physical Exam  Vitals reviewed. Constitutional: She is oriented to person, place, and time. She appears well-developed and well-nourished. No distress.  HENT:  Head: Normocephalic and atraumatic.  Eyes: Conjunctivae and EOM are normal. Pupils are equal, round, and reactive to light.  Neck: Normal range of motion. Neck supple. No thyromegaly present.  Cardiovascular: Normal rate, regular rhythm, normal heart sounds and intact distal pulses.   No murmur heard. Pulmonary/Chest: Effort normal and breath sounds normal. No respiratory distress.  Abdominal: Soft. She exhibits no distension. There is no tenderness.       + hepatomegaly  Musculoskeletal: She exhibits no edema.  Lymphadenopathy:    She has no cervical adenopathy.  Neurological: She is alert and oriented to person, place, and time.  Skin: Skin is warm and dry.  Psychiatric: She has a normal mood and affect. Her behavior is normal.          Assessment & Plan:

## 2012-03-21 NOTE — Assessment & Plan Note (Signed)
New.  dx'd by GI at recent visit.  GI recommended 65 lb weight loss.  Will follow and assist as able.

## 2012-03-24 ENCOUNTER — Encounter: Payer: Self-pay | Admitting: Family Medicine

## 2012-04-11 ENCOUNTER — Other Ambulatory Visit: Payer: Self-pay | Admitting: Family Medicine

## 2012-04-11 MED ORDER — ALPRAZOLAM 0.5 MG PO TABS: 0.5000 mg | ORAL_TABLET | Freq: Three times a day (TID) | ORAL | Status: DC | PRN

## 2012-04-11 NOTE — Telephone Encounter (Signed)
Last OV 03-21-12 last refill 01-13-12 #90 with 1 refill TID/PRN

## 2012-04-11 NOTE — Telephone Encounter (Signed)
Refill refill Alprazolam 0.5MG Tablet  Take one tablet by mouth three times a day as needed for sleep or anxiety  Last filled 6.29.13 Last wrt qty 90  Last OV 7.23.13-follow up

## 2012-04-11 NOTE — Telephone Encounter (Signed)
.  rx faxed to pharmacy, manually. Per noted first sequence as phone in, changed to print and was faxed manually

## 2012-04-11 NOTE — Telephone Encounter (Signed)
[place] for #90, 1 refill 

## 2012-04-13 ENCOUNTER — Encounter: Payer: Medicare Other | Attending: Family Medicine | Admitting: Dietician

## 2012-04-13 VITALS — Ht 65.5 in | Wt 220.7 lb

## 2012-04-13 DIAGNOSIS — E119 Type 2 diabetes mellitus without complications: Secondary | ICD-10-CM | POA: Diagnosis not present

## 2012-04-13 DIAGNOSIS — Z713 Dietary counseling and surveillance: Secondary | ICD-10-CM | POA: Diagnosis not present

## 2012-04-13 DIAGNOSIS — E1165 Type 2 diabetes mellitus with hyperglycemia: Secondary | ICD-10-CM

## 2012-04-15 ENCOUNTER — Encounter: Payer: Self-pay | Admitting: Dietician

## 2012-04-15 NOTE — Progress Notes (Signed)
  Patient was seen on 04/13/2012 for the first of a series of three diabetes self-management courses at the [facility]. The following learning objectives were met by the patient during this course:   Defines the role of glucose and insulin  Identifies type of diabetes and pathophysiology  Defines the diagnostic criteria for diabetes and prediabetes  States the risk factors for Type 2 Diabetes  States the symptoms of Type 2 Diabetes  Defines Type 2 Diabetes treatment goals  Defines Type 2 Diabetes treatment options  States the rationale for glucose monitoring  Identifies A1C, glucose targets, and testing times  Identifies proper sharps disposal  Defines the purpose of a diabetes food plan  Identifies carbohydrate food groups  Defines effects of carbohydrate foods on glucose levels  Identifies carbohydrate choices/grams/food labels  States benefits of physical activity and effect on glucose  Review of suggested activity guidelines  Handouts given during class include:  Type 2 Diabetes: Basics Book  My [place] and Activity Log  A1C:7.3% (03/21/2012)  Patient has established the following initial goals:  Increase exercise.  Monitor blood glucose levels  Work on stress levels.  Follow a diabetes meal plan  Lose weight  Follow-Up Plan: Take the Diabetes Self-Management Core Class 2&3.

## 2012-05-08 ENCOUNTER — Ambulatory Visit (INDEPENDENT_AMBULATORY_CARE_PROVIDER_SITE_OTHER): Payer: Medicare Other | Admitting: Family Medicine

## 2012-05-08 ENCOUNTER — Encounter: Payer: Self-pay | Admitting: Family Medicine

## 2012-05-08 VITALS — BP 124/80 | Temp 98.7°F | Ht 65.5 in | Wt 220.4 lb

## 2012-05-08 DIAGNOSIS — D699 Hemorrhagic condition, unspecified: Secondary | ICD-10-CM | POA: Insufficient documentation

## 2012-05-08 DIAGNOSIS — I8500 Esophageal varices without bleeding: Secondary | ICD-10-CM

## 2012-05-08 DIAGNOSIS — K7689 Other specified diseases of liver: Secondary | ICD-10-CM | POA: Diagnosis not present

## 2012-05-08 DIAGNOSIS — K7581 Nonalcoholic steatohepatitis (NASH): Secondary | ICD-10-CM

## 2012-05-08 LAB — CBC WITH DIFFERENTIAL/PLATELET
Basophils Absolute: 0 10*3/uL (ref 0.0–0.1)
Basophils Relative: 0.3 % (ref 0.0–3.0)
Eosinophils Absolute: 0 10*3/uL (ref 0.0–0.7)
Eosinophils Relative: 0.6 % (ref 0.0–5.0)
HCT: 37.1 % (ref 36.0–46.0)
Hemoglobin: 12.5 g/dL (ref 12.0–15.0)
Lymphocytes Relative: 32.2 % (ref 12.0–46.0)
Lymphs Abs: 2.2 10*3/uL (ref 0.7–4.0)
MCHC: 33.8 g/dL (ref 30.0–36.0)
MCV: 89.8 fl (ref 78.0–100.0)
Monocytes Absolute: 0.4 10*3/uL (ref 0.1–1.0)
Monocytes Relative: 6.6 % (ref 3.0–12.0)
Neutro Abs: 4 10*3/uL (ref 1.4–7.7)
Neutrophils Relative %: 60.3 % (ref 43.0–77.0)
Platelets: 121 10*3/uL — ABNORMAL LOW (ref 150.0–400.0)
RBC: 4.13 Mil/uL (ref 3.87–5.11)
RDW: 14.6 % (ref 11.5–14.6)
WBC: 6.7 10*3/uL (ref 4.5–10.5)

## 2012-05-08 LAB — BASIC METABOLIC PANEL
BUN: 9 mg/dL (ref 6–23)
CO2: 29 mEq/L (ref 19–32)
Calcium: 9.4 mg/dL (ref 8.4–10.5)
Chloride: 95 mEq/L — ABNORMAL LOW (ref 96–112)
Creatinine, Ser: 0.5 mg/dL (ref 0.4–1.2)
GFR: 118.66 mL/min (ref >60.00)
Glucose, Bld: 125 mg/dL — ABNORMAL HIGH (ref 70–99)
Potassium: 3.4 mEq/L — ABNORMAL LOW (ref 3.5–5.1)
Sodium: 135 mEq/L (ref 135–145)

## 2012-05-08 LAB — PROTIME-INR
INR: 1.1 ratio — ABNORMAL HIGH (ref 0.8–1.0)
Prothrombin Time: 12.3 s (ref 10.2–12.4)

## 2012-05-08 LAB — HEPATIC FUNCTION PANEL
ALT: 25 U/L (ref 0–35)
AST: 42 U/L — ABNORMAL HIGH (ref 0–37)
Albumin: 4.2 g/dL (ref 3.5–5.2)
Alkaline Phosphatase: 56 U/L (ref 39–117)
Bilirubin, Direct: 0.2 mg/dL (ref 0.0–0.3)
Total Bilirubin: 0.8 mg/dL (ref 0.3–1.2)
Total Protein: 7.7 g/dL (ref 6.0–8.3)

## 2012-05-08 LAB — APTT: aPTT: 29.5 s — ABNORMAL HIGH (ref 21.7–28.8)

## 2012-05-08 NOTE — Patient Instructions (Addendum)
We'll call you with your appt to the liver specialist We'll notify you of your lab results Try not to stress about this- we'll figure this out! Happy [place] Ouch!

## 2012-05-08 NOTE — Progress Notes (Signed)
  Subjective:    Patient ID: Rachael Buck, female    DOB: 1940/11/28, 71 y.o.   MRN: [id number]  HPI Bleeding disorder- pt had colonoscopy this summer (June) and had 'too much bleeding' for Dr Kinnie Scales to perform 2nd bx.  This weekend, stood up and tore tag/mole from leg and 'i was covered in blood'.  Bled for ~5 minutes despite using styptic stick.  Pt notes easier bleeding w/ 'any bump or cut' for most of this summer.  Not currently on ASA.  Worried that the bleeding has something to do w/ her liver.  Son died of idiopathic aplastic anemia.  Not currently bleeding.   Review of Systems For ROS see HPI     Objective:   Physical Exam  Vitals reviewed. Constitutional: She appears well-developed and well-nourished. No distress.  Neck: Normal range of motion. Neck supple.  Abdominal: Soft. Bowel sounds are normal. She exhibits mass (marked hepatomegaly). She exhibits no distension. There is no tenderness. There is no rebound.  Lymphadenopathy:    She has no cervical adenopathy.  Psychiatric: Her behavior is normal. Thought content normal.       Mildly anxious          Assessment & Plan:

## 2012-05-09 NOTE — Assessment & Plan Note (Signed)
Possible underlying cause of increased bleeding.  Check labs.  Refer to hepatology.

## 2012-05-09 NOTE — Assessment & Plan Note (Signed)
New.  Pt reports when she bleeds it seems to take longer than usual to stop.  She fears this is due to her liver dysfxn.  Reviewed that liver disease can result in bleeding disorders.  Will check LFTs, PT, PTT, CBC.  Plan is to refer her to hepatologist for evaluation and tx of liver issue (including esophageal varices).  If bleeding continues, will refer to hematology.  Pt expressed understanding and is in agreement w/ plan.

## 2012-05-09 NOTE — Assessment & Plan Note (Signed)
Pt was following w/ Dr Kinnie Scales but now in setting of possible bleeding disorder, feel it appropriate to refer her to subspecialist in hepatology.  Pt expressed understanding and is in agreement w/ plan.

## 2012-05-24 DIAGNOSIS — Z23 Encounter for immunization: Secondary | ICD-10-CM | POA: Diagnosis not present

## 2012-06-01 ENCOUNTER — Ambulatory Visit: Payer: Medicare Other

## 2012-06-15 ENCOUNTER — Encounter: Payer: Medicare Other | Attending: Family Medicine | Admitting: Dietician

## 2012-06-15 DIAGNOSIS — E1165 Type 2 diabetes mellitus with hyperglycemia: Secondary | ICD-10-CM

## 2012-06-15 DIAGNOSIS — Z713 Dietary counseling and surveillance: Secondary | ICD-10-CM | POA: Diagnosis not present

## 2012-06-15 DIAGNOSIS — E119 Type 2 diabetes mellitus without complications: Secondary | ICD-10-CM | POA: Diagnosis not present

## 2012-06-15 NOTE — Progress Notes (Signed)
  Patient was seen on 06/15/2012 for the second of a series of three diabetes self-management courses at the [facility]. The following learning objectives were met by the patient during this course:   Explain basic nutrition maintenance and quality assurance  Describe causes, symptoms and treatment of hypoglycemia and hyperglycemia  Explain how to manage diabetes during illness  Describe the importance of good nutrition for health and healthy eating strategies  List strategies to follow meal plan when dining out  Describe the effects of alcohol on glucose and how to use it safely  Describe problem solving skills for day-to-day glucose challenges  Describe strategies to use when treatment plan needs to change  Identify important factors involved in successful weight loss  Describe ways to remain physically active  Describe the impact of regular activity on insulin resistance   Handouts given in class:  Refrigerator magnet for Sick Day Guidelines  [facility] Oral medication/insulin handout  Follow-Up Plan: Patient will attend the final class of the ADA Diabetes Self-Care Education.

## 2012-06-21 ENCOUNTER — Ambulatory Visit (INDEPENDENT_AMBULATORY_CARE_PROVIDER_SITE_OTHER): Payer: Medicare Other | Admitting: Family Medicine

## 2012-06-21 ENCOUNTER — Encounter: Payer: Self-pay | Admitting: Family Medicine

## 2012-06-21 VITALS — BP 127/81 | HR 97 | Temp 98.3°F | Ht 65.0 in | Wt 221.0 lb

## 2012-06-21 DIAGNOSIS — K7581 Nonalcoholic steatohepatitis (NASH): Secondary | ICD-10-CM

## 2012-06-21 DIAGNOSIS — IMO0001 Reserved for inherently not codable concepts without codable children: Secondary | ICD-10-CM | POA: Diagnosis not present

## 2012-06-21 DIAGNOSIS — K7689 Other specified diseases of liver: Secondary | ICD-10-CM

## 2012-06-21 DIAGNOSIS — I10 Essential (primary) hypertension: Secondary | ICD-10-CM

## 2012-06-21 DIAGNOSIS — E782 Mixed hyperlipidemia: Secondary | ICD-10-CM | POA: Diagnosis not present

## 2012-06-21 DIAGNOSIS — E1165 Type 2 diabetes mellitus with hyperglycemia: Secondary | ICD-10-CM

## 2012-06-21 LAB — HEPATIC FUNCTION PANEL
ALT: 26 U/L (ref 0–35)
AST: 40 U/L — ABNORMAL HIGH (ref 0–37)
Albumin: 3.7 g/dL (ref 3.5–5.2)
Alkaline Phosphatase: 52 U/L (ref 39–117)
Bilirubin, Direct: 0.2 mg/dL (ref 0.0–0.3)
Total Bilirubin: 0.8 mg/dL (ref 0.3–1.2)
Total Protein: 7.5 g/dL (ref 6.0–8.3)

## 2012-06-21 LAB — LIPID PANEL
Cholesterol: 123 mg/dL (ref 0–200)
HDL: 40.9 mg/dL (ref >39.00)
Total CHOL/HDL Ratio: 3
Triglycerides: 207 mg/dL — ABNORMAL HIGH (ref 0.0–149.0)
VLDL: 41.4 mg/dL — ABNORMAL HIGH (ref 0.0–40.0)

## 2012-06-21 LAB — HEMOGLOBIN A1C: Hgb A1c MFr Bld: 6.7 % — ABNORMAL HIGH (ref 4.6–6.5)

## 2012-06-21 LAB — BASIC METABOLIC PANEL
BUN: 10 mg/dL (ref 6–23)
CO2: 34 mEq/L — ABNORMAL HIGH (ref 19–32)
Calcium: 9.2 mg/dL (ref 8.4–10.5)
Chloride: 95 mEq/L — ABNORMAL LOW (ref 96–112)
Creatinine, Ser: 0.6 mg/dL (ref 0.4–1.2)
GFR: 111.45 mL/min (ref >60.00)
Glucose, Bld: 127 mg/dL — ABNORMAL HIGH (ref 70–99)
Potassium: 3.6 mEq/L (ref 3.5–5.1)
Sodium: 136 mEq/L (ref 135–145)

## 2012-06-21 LAB — AMMONIA: Ammonia: 45 umol/L (ref 16–53)

## 2012-06-21 LAB — LDL CHOLESTEROL, DIRECT: Direct LDL: 54.8 mg/dL

## 2012-06-21 NOTE — Progress Notes (Signed)
  Subjective:    Patient ID: Rachael Buck, female    DOB: 02-16-1941, 71 y.o.   MRN: [id number]  HPI DM- chronic problem, pt has been focusing on healthy diet.  Plans to start walking now that weather has cooled.  Due for eye exam.  No CP, HAs, visual changes, edema.  Mild SOB w/ exertion- noted w/ weather change.  + fatigue.  Hyperlipidemia- chronic problem, on Zocor 80mg .  Some abdominal tightness, particularly after eating.  No N/V, myalgias  HTN- chronic problem, well controlled today on HCTZ, cozaar.  Asymptomatic.  NASH- pt w/ fatigue, denies confusion.  + abdominal discomfort.  No N/V.   Review of Systems For ROS see HPI     Objective:   Physical Exam  Vitals reviewed. Constitutional: She is oriented to person, place, and time. She appears well-developed and well-nourished. No distress.  HENT:  Head: Normocephalic and atraumatic.  Eyes: Conjunctivae normal and EOM are normal. Pupils are equal, round, and reactive to light.  Neck: Normal range of motion. Neck supple. No thyromegaly present.  Cardiovascular: Normal rate, regular rhythm, normal heart sounds and intact distal pulses.   No murmur heard. Pulmonary/Chest: Effort normal and breath sounds normal. No respiratory distress.  Abdominal: Soft. She exhibits mass (hepatomegaly). She exhibits no distension. There is no tenderness.  Musculoskeletal: She exhibits no edema.  Lymphadenopathy:    She has no cervical adenopathy.  Neurological: She is alert and oriented to person, place, and time.  Skin: Skin is warm and dry.  Psychiatric: She has a normal mood and affect. Her behavior is normal.          Assessment & Plan:

## 2012-06-21 NOTE — Assessment & Plan Note (Signed)
Chronic problem.  On statin w/out difficulty.  Has liver dysfxn.  Check labs.  Adjust meds prn

## 2012-06-21 NOTE — Assessment & Plan Note (Signed)
Chronic problem.  Pt is attempting to follow ADA diet and seeing nutrition.  Encouraged her to schedule eye exam.  Check labs.  Adjust meds prn

## 2012-06-21 NOTE — Patient Instructions (Signed)
Follow up in 3 months to recheck diabetes Schedule your physical in 6 months We'll notify you of your lab results and make any changes if needed Call with any questions or concerns- you look good!!! Happy [place]!

## 2012-06-21 NOTE — Assessment & Plan Note (Signed)
Chronic problem.  Due to fatigue check LFTs, ammonia.  Pt has f/u at [facility] pending.

## 2012-06-21 NOTE — Assessment & Plan Note (Signed)
Chronic problem.  Well controlled today.  Asymptomatic.  No changes. 

## 2012-06-23 LAB — TSH: TSH: 1.42 u[IU]/mL (ref 0.35–5.50)

## 2012-06-29 ENCOUNTER — Encounter: Payer: Medicare Other | Admitting: *Deleted

## 2012-06-29 DIAGNOSIS — E1165 Type 2 diabetes mellitus with hyperglycemia: Secondary | ICD-10-CM

## 2012-06-29 NOTE — Progress Notes (Signed)
  Patient was seen on 06/29/12 for the third of a series of three diabetes self-management courses at the [facility]. The following learning objectives were met by the patient during this course:    Describe how diabetes changes over time   Identify diabetes complications and ways to prevent them   Describe strategies that can promote heart health including lowering blood pressure and cholesterol   Describe strategies to lower dietary fat and sodium in the diet   Identify physical activities that benefit cardiovascular health   Evaluate success in meeting personal goal   Describe the belief that they can live successfully with diabetes day to day   Establish 2-3 goals that they will plan to diligently work on until they return for the free 77-monthfollow-up visit  The following handouts were given in class:  3 Month Follow Up Visit handout  Goal setting handout  Class evaluation form  Your patient has established the following 3 month goals for diabetes self-care:  Increase physical activity 4 times a week  Count carbs at most meals and snacks  Follow-Up Plan: Patient will attend a 3 month follow-up visit for diabetes self-management education.

## 2012-06-29 NOTE — Patient Instructions (Signed)
Goals:  Follow Diabetes Meal Plan as instructed  Eat 3 meals and 2 snacks, every 3-5 hrs  Limit carbohydrate intake to 30-45 grams carbohydrate/meal  Limit carbohydrate intake to 15 grams carbohydrate/snack  Add lean protein foods to meals/snacks  Monitor glucose levels as instructed by your doctor  Aim for 30 mins of physical activity daily  Bring food record and glucose log to your next nutrition visit 

## 2012-07-10 ENCOUNTER — Other Ambulatory Visit: Payer: Self-pay | Admitting: Family Medicine

## 2012-07-10 DIAGNOSIS — Z79899 Other long term (current) drug therapy: Secondary | ICD-10-CM | POA: Diagnosis not present

## 2012-07-10 DIAGNOSIS — K7460 Unspecified cirrhosis of liver: Secondary | ICD-10-CM | POA: Diagnosis not present

## 2012-07-10 MED ORDER — ALPRAZOLAM 0.5 MG PO TABS: 0.5000 mg | ORAL_TABLET | Freq: Three times a day (TID) | ORAL | Status: DC | PRN

## 2012-07-10 NOTE — Telephone Encounter (Signed)
Rx given to Pt husband at OV per Dr Beverely Low.

## 2012-07-10 NOTE — Telephone Encounter (Signed)
LM ON TRIAGE LINE [id number]--  per [email or web address]  needs refill for ALPRAZolam (Tab) XANAX 0.5 MG Take 1 tablet (0.5 mg total) by mouth 3 (three) times daily as needed for sleep or anxiety --last fill was 9.30.13 for #90 please fax to [phone] or call into pharm

## 2012-07-26 DIAGNOSIS — R160 Hepatomegaly, not elsewhere classified: Secondary | ICD-10-CM | POA: Diagnosis not present

## 2012-07-26 DIAGNOSIS — K7460 Unspecified cirrhosis of liver: Secondary | ICD-10-CM | POA: Diagnosis not present

## 2012-07-26 DIAGNOSIS — K7689 Other specified diseases of liver: Secondary | ICD-10-CM | POA: Diagnosis not present

## 2012-08-03 ENCOUNTER — Telehealth: Payer: Self-pay | Admitting: Family Medicine

## 2012-08-03 NOTE — Telephone Encounter (Signed)
Pt schedule for appt will bring copy of MRI to discuss

## 2012-08-03 NOTE — Telephone Encounter (Signed)
Patient would like to discuss her MRI results from [facility]. She states she has them, but would like further clarification. Patient states she will come in for OV if necessary.

## 2012-08-10 ENCOUNTER — Encounter: Payer: Self-pay | Admitting: Family Medicine

## 2012-08-10 ENCOUNTER — Ambulatory Visit (INDEPENDENT_AMBULATORY_CARE_PROVIDER_SITE_OTHER): Payer: Medicare Other | Admitting: Family Medicine

## 2012-08-10 VITALS — BP 130/70 | HR 93 | Temp 98.3°F | Ht 65.0 in | Wt 218.2 lb

## 2012-08-10 DIAGNOSIS — K7689 Other specified diseases of liver: Secondary | ICD-10-CM | POA: Diagnosis not present

## 2012-08-10 DIAGNOSIS — Z23 Encounter for immunization: Secondary | ICD-10-CM

## 2012-08-10 DIAGNOSIS — K7581 Nonalcoholic steatohepatitis (NASH): Secondary | ICD-10-CM

## 2012-08-10 NOTE — Progress Notes (Signed)
  Subjective:    Patient ID: Rachael Buck, female    DOB: 22-Dec-1940, 71 y.o.   MRN: [id number]  HPI Pt here today to review results of recent MRI at [facility].   Review of Systems Denies abd pain, N/V/D.  Some SOB w/ exertion.  No CP, palpitations, edema.    Objective:   Physical Exam  Vitals reviewed. Constitutional: She is oriented to person, place, and time. She appears well-developed and well-nourished. No distress.  Neurological: She is alert and oriented to person, place, and time.  Skin: Skin is warm and dry.  Psychiatric: She has a normal mood and affect. Her behavior is normal.          Assessment & Plan:

## 2012-08-10 NOTE — Assessment & Plan Note (Signed)
Start shot series today due to NASH

## 2012-08-10 NOTE — Assessment & Plan Note (Signed)
Reviewed MRI w/ pt.  Scan showed craniocaudal diameter of 21 cm (~3x normal).  No evidence of tumor, mass, common bile duct obstruction.  Nothing to suggest hepatocellular carcinoma.  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  Will start series today.  Answered all pt's questions and encouraged her to f/u as recommended at [facility].  >30 minutes spent w/ pt, >50% spent counseling

## 2012-08-10 NOTE — Patient Instructions (Addendum)
Schedule your 2nd injection in 1 month (the 3rd will be 2 months after that) Call and schedule your 6 month f/u at [facility] Call with any questions or concerns Happy Holidays!!!

## 2012-08-29 ENCOUNTER — Encounter: Payer: Self-pay | Admitting: Family Medicine

## 2012-08-29 DIAGNOSIS — E1139 Type 2 diabetes mellitus with other diabetic ophthalmic complication: Secondary | ICD-10-CM | POA: Diagnosis not present

## 2012-08-31 ENCOUNTER — Telehealth: Payer: Self-pay | Admitting: *Deleted

## 2012-08-31 NOTE — Telephone Encounter (Signed)
Spoke with patient, toenails are still black and would like to be seen. Appointment made for Monday 09/04/12 at 1pm

## 2012-09-01 ENCOUNTER — Encounter: Payer: Self-pay | Admitting: Lab

## 2012-09-04 ENCOUNTER — Ambulatory Visit (INDEPENDENT_AMBULATORY_CARE_PROVIDER_SITE_OTHER): Payer: Medicare Other | Admitting: Family Medicine

## 2012-09-04 ENCOUNTER — Encounter: Payer: Self-pay | Admitting: Family Medicine

## 2012-09-04 VITALS — BP 130/70 | HR 104 | Temp 99.0°F | Ht 64.5 in | Wt 219.2 lb

## 2012-09-04 DIAGNOSIS — L609 Nail disorder, unspecified: Secondary | ICD-10-CM | POA: Insufficient documentation

## 2012-09-04 NOTE — Progress Notes (Signed)
  Subjective:    Patient ID: DAVEAH VARONE, female    DOB: [birth year], 72 y.o.   MRN: [id number]  HPI Black toe nails- noticed 1-2 weeks ago, no pain.  No known injury.  Affecting bilateral great toes and R 2nd toe.  No change in footwear.     Review of Systems For ROS see HPI     Objective:   Physical Exam  Vitals reviewed. Constitutional: She appears well-developed and well-nourished. No distress.  Cardiovascular: Intact distal pulses.   Musculoskeletal:       Bunions of great toes bilaterally  Skin: Skin is warm and dry.       Darkened great toe nails bilaterally Dark 2nd toe on R No induration, erythema, or fluctuance of skin surrounding nail          Assessment & Plan:

## 2012-09-04 NOTE — Patient Instructions (Addendum)
This appears to be nail bruising- likely due to the pressure distribution from the bunions. We'll call you with your podiatry appt Do NOT alter the way you walk!  I don't want you to fall! Call with any questions or concerns Happy New Year!!!

## 2012-09-05 NOTE — Assessment & Plan Note (Signed)
Pt's black nails appear to be consistent w/ bruising.  Due to excessive angulation from bunions bilaterally, concern for abnormal pressure distribution causing the bruising.  Pt's footwear is appropriate.  No surrounding skin changes or pain.  Refer to podiatry.  Pt expressed understanding and is in agreement w/ plan.

## 2012-09-11 ENCOUNTER — Ambulatory Visit (INDEPENDENT_AMBULATORY_CARE_PROVIDER_SITE_OTHER): Payer: Medicare Other | Admitting: *Deleted

## 2012-09-11 ENCOUNTER — Telehealth: Payer: Self-pay | Admitting: Family Medicine

## 2012-09-11 DIAGNOSIS — Z23 Encounter for immunization: Secondary | ICD-10-CM

## 2012-09-11 MED ORDER — HYDROCORTISONE 2.5 % EX CREA: TOPICAL_CREAM | Freq: Two times a day (BID) | CUTANEOUS | Status: DC

## 2012-09-11 NOTE — Telephone Encounter (Signed)
Refill: Hydrocortisone 2.5% cream. Apply to affected area twice a day. Qty 20 gm. Last fill 06-08-10

## 2012-09-11 NOTE — Telephone Encounter (Signed)
Please advise on RF request.  Last OV:09-04-12.//AB/CMA 

## 2012-09-11 NOTE — Telephone Encounter (Signed)
Rx sent to the pharmacy by e-script.//AB/CMA 

## 2012-09-11 NOTE — Telephone Encounter (Signed)
Ok for refill, #60 grams, 3 refills

## 2012-09-13 DIAGNOSIS — E119 Type 2 diabetes mellitus without complications: Secondary | ICD-10-CM | POA: Diagnosis not present

## 2012-09-13 DIAGNOSIS — L608 Other nail disorders: Secondary | ICD-10-CM | POA: Diagnosis not present

## 2012-09-13 DIAGNOSIS — M2010 Hallux valgus (acquired), unspecified foot: Secondary | ICD-10-CM | POA: Diagnosis not present

## 2012-09-13 DIAGNOSIS — S91109A Unspecified open wound of unspecified toe(s) without damage to nail, initial encounter: Secondary | ICD-10-CM | POA: Diagnosis not present

## 2012-09-20 ENCOUNTER — Encounter: Payer: Self-pay | Admitting: Lab

## 2012-09-21 ENCOUNTER — Encounter: Payer: Self-pay | Admitting: Family Medicine

## 2012-09-21 ENCOUNTER — Ambulatory Visit (INDEPENDENT_AMBULATORY_CARE_PROVIDER_SITE_OTHER): Payer: Medicare Other | Admitting: Family Medicine

## 2012-09-21 VITALS — BP 130/70 | HR 98 | Ht 65.0 in | Wt 216.0 lb

## 2012-09-21 DIAGNOSIS — I10 Essential (primary) hypertension: Secondary | ICD-10-CM | POA: Diagnosis not present

## 2012-09-21 DIAGNOSIS — E1165 Type 2 diabetes mellitus with hyperglycemia: Secondary | ICD-10-CM

## 2012-09-21 DIAGNOSIS — IMO0001 Reserved for inherently not codable concepts without codable children: Secondary | ICD-10-CM | POA: Diagnosis not present

## 2012-09-21 DIAGNOSIS — Z79899 Other long term (current) drug therapy: Secondary | ICD-10-CM | POA: Diagnosis not present

## 2012-09-21 LAB — BASIC METABOLIC PANEL
BUN: 12 mg/dL (ref 6–23)
CO2: 31 mEq/L (ref 19–32)
Calcium: 9.5 mg/dL (ref 8.4–10.5)
Chloride: 95 mEq/L — ABNORMAL LOW (ref 96–112)
Creatinine, Ser: 0.6 mg/dL (ref 0.4–1.2)
GFR: 107.02 mL/min (ref >60.00)
Glucose, Bld: 160 mg/dL — ABNORMAL HIGH (ref 70–99)
Potassium: 3.7 mEq/L (ref 3.5–5.1)
Sodium: 134 mEq/L — ABNORMAL LOW (ref 135–145)

## 2012-09-21 LAB — HEMOGLOBIN A1C: Hgb A1c MFr Bld: 6.6 % — ABNORMAL HIGH (ref 4.6–6.5)

## 2012-09-21 NOTE — Patient Instructions (Addendum)
Schedule your complete physical in 3 months Keep up the good work!  You look great!! We'll notify you of your lab results and make any changes if needed Call with any questions or concerns Happy New Year!!!

## 2012-09-21 NOTE — Assessment & Plan Note (Signed)
Chronic problem.  Asymptomatic.  Tolerating med w/out difficulty.  Continues to lose weight.  UTD on eye exam.  Now seeing podiatry for foot exam.  Check labs.  Adjust meds prn

## 2012-09-21 NOTE — Assessment & Plan Note (Signed)
Chronic problem, adequate control.  Asymptomatic.  No anticipated changes.  Check labs.

## 2012-09-21 NOTE — Progress Notes (Signed)
  Subjective:    Patient ID: Rachael Buck, female    DOB: Jul 12, 1941, 72 y.o.   MRN: [id number]  HPI DM- chronic problem, has lost 3-4 more lbs since 1/6.  CBG today was 94.  On Metformin.  UTD on eye exam.  Will have rare symptomatic lows.  No CP, SOB, HAs, visual changes, edema, N/V.  Now following w/ podiatry.  HTN- chronic problem, adequate control today on HCTZ, Cozaar.  Asymptomatic.   Review of Systems For ROS see HPI     Objective:   Physical Exam  Vitals reviewed. Constitutional: She is oriented to person, place, and time. She appears well-developed and well-nourished. No distress.  HENT:  Head: Normocephalic and atraumatic.  Eyes: Conjunctivae normal and EOM are normal. Pupils are equal, round, and reactive to light.  Neck: Normal range of motion. Neck supple. No thyromegaly present.  Cardiovascular: Normal rate, regular rhythm, normal heart sounds and intact distal pulses.   No murmur heard. Pulmonary/Chest: Effort normal and breath sounds normal. No respiratory distress.  Abdominal: Soft. She exhibits mass (hepatomegaly). She exhibits no distension. There is no tenderness.  Musculoskeletal: She exhibits no edema.  Lymphadenopathy:    She has no cervical adenopathy.  Neurological: She is alert and oriented to person, place, and time.  Skin: Skin is warm and dry.  Psychiatric: She has a normal mood and affect. Her behavior is normal.          Assessment & Plan:

## 2012-09-28 ENCOUNTER — Ambulatory Visit: Payer: Medicare Other | Admitting: *Deleted

## 2012-10-02 ENCOUNTER — Ambulatory Visit: Payer: Medicare Other | Admitting: *Deleted

## 2012-10-02 ENCOUNTER — Other Ambulatory Visit: Payer: Self-pay | Admitting: Family Medicine

## 2012-10-03 NOTE — Telephone Encounter (Signed)
Rx sent to the pharmacy by e-script.//AB/CMA 

## 2012-10-10 ENCOUNTER — Telehealth: Payer: Self-pay | Admitting: Family Medicine

## 2012-10-10 NOTE — Telephone Encounter (Signed)
refill  ALPRAZolam (Tab) 0.5 MG Take 1 tablet (0.5 mg total) by mouth 3 (three) times daily as needed for sleep or anxiety -- last fill 12.30.13

## 2012-10-10 NOTE — Telephone Encounter (Signed)
Last OV 09-21-12, last filled 07-10-12 #90 1

## 2012-10-11 MED ORDER — ALPRAZOLAM 0.5 MG PO TABS: 0.5000 mg | ORAL_TABLET | Freq: Three times a day (TID) | ORAL | Status: DC | PRN

## 2012-10-11 NOTE — Telephone Encounter (Signed)
[place] for #90, 1 refill 

## 2012-10-11 NOTE — Telephone Encounter (Signed)
Rx sent to the pharmacy([facility]) by fax.//AB/CMA

## 2012-10-14 ENCOUNTER — Other Ambulatory Visit: Payer: Self-pay

## 2012-10-16 ENCOUNTER — Encounter: Payer: Self-pay | Admitting: Family Medicine

## 2012-10-23 ENCOUNTER — Telehealth: Payer: Self-pay | Admitting: Family Medicine

## 2012-10-23 ENCOUNTER — Ambulatory Visit (INDEPENDENT_AMBULATORY_CARE_PROVIDER_SITE_OTHER): Payer: Medicare Other | Admitting: Family Medicine

## 2012-10-23 VITALS — BP 112/60 | HR 97 | Temp 98.4°F | Ht 65.0 in | Wt 216.0 lb

## 2012-10-23 DIAGNOSIS — R0989 Other specified symptoms and signs involving the circulatory and respiratory systems: Secondary | ICD-10-CM

## 2012-10-23 DIAGNOSIS — K7581 Nonalcoholic steatohepatitis (NASH): Secondary | ICD-10-CM

## 2012-10-23 DIAGNOSIS — K7689 Other specified diseases of liver: Secondary | ICD-10-CM | POA: Diagnosis not present

## 2012-10-23 DIAGNOSIS — R0609 Other forms of dyspnea: Secondary | ICD-10-CM

## 2012-10-23 DIAGNOSIS — R142 Eructation: Secondary | ICD-10-CM | POA: Diagnosis not present

## 2012-10-23 DIAGNOSIS — R109 Unspecified abdominal pain: Secondary | ICD-10-CM | POA: Diagnosis not present

## 2012-10-23 DIAGNOSIS — R141 Gas pain: Secondary | ICD-10-CM | POA: Diagnosis not present

## 2012-10-23 DIAGNOSIS — R140 Abdominal distension (gaseous): Secondary | ICD-10-CM

## 2012-10-23 DIAGNOSIS — R143 Flatulence: Secondary | ICD-10-CM | POA: Diagnosis not present

## 2012-10-23 NOTE — Assessment & Plan Note (Signed)
New.  Mild.  No obvious ascites or fluid wave.  No peripheral edema, lung crackles or other signs of volume overload.  Weight is stable.  + hepatomegaly but this is not new.  Check labs.  Encouraged pt to call GI.

## 2012-10-23 NOTE — Telephone Encounter (Signed)
Scheduled 3:30 today advised as stated- pt understood will contact liver specialist & go to ed if unable to wait til 3:30

## 2012-10-23 NOTE — Telephone Encounter (Signed)
Patient Information:  Caller Name: Bernece  Phone: [phone]  Patient: Rachael Buck, Rachael Buck  Gender: Female  DOB: [birth year]  Age: 72 Years  PCP: Midge Minium.  Office Follow Up:  Does the office need to follow up with this patient?: Yes  Instructions For The Office: Disposition for pt to see office now.  Please see if pt can be worked in earlier.  RN Note:  Instructed caller office will contact her regarding time of appt.   Symptoms  Reason For Call & Symptoms: Abdominal swelling, shortness breath.  Pt with cirrhosis of the liver.  Pt having lower abd pain.  Shortness of breath with activity and when lying down. No chest pain. Constant pressure in the abdomen.  Reviewed Health History In EMR: Yes  Reviewed Medications In EMR: Yes  Reviewed Allergies In EMR: Yes  Reviewed Surgeries / Procedures: Yes  Date of Onset of Symptoms: 10/18/2012  Guideline(s) Used:  Abdominal Pain - Female  Breathing Difficulty  Disposition Per Guideline:   Go to Office Now  Reason For Disposition Reached:   Mild difficulty breathing (e.g., minimal/no SOB at rest, SOB with walking, pulse < 100) of new onset or worse than normal  Advice Given:  General Care Advice for Breathing Difficulty:  Find position of greatest comfort. For most patients the best position is semi-upright (e.g., sitting up in a comfortable chair or lying back against pillows).  Call Back If:  Severe difficulty breathing occurs  Fever more than 100.5 F (38.1 C)  You become worse.

## 2012-10-23 NOTE — Telephone Encounter (Signed)
Please schedule her for this afternoon- if she feels she can't wait til this afternoon, needs to go to ER.  Also should call liver specialist

## 2012-10-23 NOTE — Patient Instructions (Addendum)
Please call your GI doctor and let them know what's going on- abd discomfort, distention, increased shortness of breath We'll notify you of your lab results and make any changes if needed If your symptoms change or worsen, please call or go to the ER Hang in there!!!

## 2012-10-23 NOTE — Progress Notes (Signed)
  Subjective:    Patient ID: Rachael Buck, female    DOB: Aug 21, 1941, 72 y.o.   MRN: [id number]  HPI Pt notes abdominal distension and swelling.  Having a 'constant pressure pain' across abdomen.  sxs started on Wednesday.  Having SOB w/ lying flat or any type of exertion.  No swelling of hands or feet.  Weight is stable.  No N/V.  Having dark black stools, small caliber.  No med changes.  Did not call GI as recommended.   Review of Systems For ROS see HPI     Objective:   Physical Exam  Vitals reviewed. Constitutional: She is oriented to person, place, and time. She appears well-developed and well-nourished. No distress.  Cardiovascular: Normal rate, regular rhythm, normal heart sounds and intact distal pulses.   Pulmonary/Chest: Effort normal and breath sounds normal. No respiratory distress. She has no wheezes. She has no rales.  Abdominal: Soft. She exhibits distension (mild, no obvious ascites or fluid wave) and mass (hepatomegaly). There is tenderness (over enlarged liver). There is no rebound and no guarding.  Musculoskeletal: She exhibits no edema (of hands or feet).  Neurological: She is alert and oriented to person, place, and time.  Skin: Skin is warm and dry. No pallor.  No jaundice          Assessment & Plan:

## 2012-10-23 NOTE — Assessment & Plan Note (Signed)
Chronic problem.  Stable hepatomegaly w/ some TTP.  Check LFTs.  Encouraged pt to call GI.  No evidence of volume overload.

## 2012-10-23 NOTE — Assessment & Plan Note (Signed)
New.  Mild.  Only TTP over enlarged liver.  Check labs.

## 2012-10-23 NOTE — Assessment & Plan Note (Signed)
Ongoing problem for pt.  No crackles on lung exam.  Suspect partly due to deconditioning.  Orthopnea is likely due in some part to her hepatomegaly and pressure on the R hemidiaphragm.  Will follow.

## 2012-10-24 LAB — CBC WITH DIFFERENTIAL/PLATELET
Basophils Absolute: 0 10*3/uL (ref 0.0–0.1)
Basophils Relative: 0.5 % (ref 0.0–3.0)
Eosinophils Absolute: 0.1 10*3/uL (ref 0.0–0.7)
Eosinophils Relative: 0.8 % (ref 0.0–5.0)
HCT: 37.8 % (ref 36.0–46.0)
Hemoglobin: 12.7 g/dL (ref 12.0–15.0)
Lymphocytes Relative: 39.4 % (ref 12.0–46.0)
Lymphs Abs: 3.3 10*3/uL (ref 0.7–4.0)
MCHC: 33.7 g/dL (ref 30.0–36.0)
MCV: 88.3 fl (ref 78.0–100.0)
Monocytes Absolute: 0.5 10*3/uL (ref 0.1–1.0)
Monocytes Relative: 6.1 % (ref 3.0–12.0)
Neutro Abs: 4.4 10*3/uL (ref 1.4–7.7)
Neutrophils Relative %: 53.2 % (ref 43.0–77.0)
Platelets: 139 10*3/uL — ABNORMAL LOW (ref 150.0–400.0)
RBC: 4.28 Mil/uL (ref 3.87–5.11)
RDW: 14.7 % — ABNORMAL HIGH (ref 11.5–14.6)
WBC: 8.4 10*3/uL (ref 4.5–10.5)

## 2012-10-24 LAB — HEPATIC FUNCTION PANEL
ALT: 28 U/L (ref 0–35)
AST: 46 U/L — ABNORMAL HIGH (ref 0–37)
Albumin: 4.1 g/dL (ref 3.5–5.2)
Alkaline Phosphatase: 59 U/L (ref 39–117)
Bilirubin, Direct: 0.1 mg/dL (ref 0.0–0.3)
Total Bilirubin: 1 mg/dL (ref 0.3–1.2)
Total Protein: 8.1 g/dL (ref 6.0–8.3)

## 2012-10-24 LAB — BASIC METABOLIC PANEL
BUN: 11 mg/dL (ref 6–23)
CO2: 31 mEq/L (ref 19–32)
Calcium: 9.6 mg/dL (ref 8.4–10.5)
Chloride: 94 mEq/L — ABNORMAL LOW (ref 96–112)
Creatinine, Ser: 0.6 mg/dL (ref 0.4–1.2)
GFR: 104.94 mL/min (ref >60.00)
Glucose, Bld: 109 mg/dL — ABNORMAL HIGH (ref 70–99)
Potassium: 3.8 mEq/L (ref 3.5–5.1)
Sodium: 137 mEq/L (ref 135–145)

## 2012-10-25 NOTE — Addendum Note (Signed)
Addended by: Sheliah Hatch on: 10/25/2012 03:15 PM   Modules accepted: Orders

## 2012-11-09 ENCOUNTER — Ambulatory Visit: Payer: Medicare Other

## 2012-11-16 DIAGNOSIS — E119 Type 2 diabetes mellitus without complications: Secondary | ICD-10-CM | POA: Diagnosis not present

## 2012-11-16 DIAGNOSIS — L608 Other nail disorders: Secondary | ICD-10-CM | POA: Diagnosis not present

## 2012-11-16 DIAGNOSIS — L84 Corns and callosities: Secondary | ICD-10-CM | POA: Diagnosis not present

## 2012-11-23 ENCOUNTER — Ambulatory Visit (INDEPENDENT_AMBULATORY_CARE_PROVIDER_SITE_OTHER): Payer: Medicare Other | Admitting: *Deleted

## 2012-11-23 DIAGNOSIS — Z23 Encounter for immunization: Secondary | ICD-10-CM | POA: Diagnosis not present

## 2012-12-06 DIAGNOSIS — R197 Diarrhea, unspecified: Secondary | ICD-10-CM | POA: Diagnosis not present

## 2012-12-20 ENCOUNTER — Encounter: Payer: Medicare Other | Admitting: Family Medicine

## 2012-12-20 ENCOUNTER — Encounter: Payer: Self-pay | Admitting: Family Medicine

## 2012-12-20 ENCOUNTER — Ambulatory Visit (INDEPENDENT_AMBULATORY_CARE_PROVIDER_SITE_OTHER): Payer: Medicare Other | Admitting: Family Medicine

## 2012-12-20 VITALS — BP 118/68 | HR 95 | Temp 98.5°F | Ht 65.0 in | Wt 219.8 lb

## 2012-12-20 DIAGNOSIS — E2839 Other primary ovarian failure: Secondary | ICD-10-CM

## 2012-12-20 DIAGNOSIS — Z0000 Encounter for general adult medical examination without abnormal findings: Secondary | ICD-10-CM | POA: Diagnosis not present

## 2012-12-20 DIAGNOSIS — E782 Mixed hyperlipidemia: Secondary | ICD-10-CM

## 2012-12-20 DIAGNOSIS — I10 Essential (primary) hypertension: Secondary | ICD-10-CM

## 2012-12-20 DIAGNOSIS — IMO0001 Reserved for inherently not codable concepts without codable children: Secondary | ICD-10-CM

## 2012-12-20 DIAGNOSIS — E1165 Type 2 diabetes mellitus with hyperglycemia: Secondary | ICD-10-CM

## 2012-12-20 MED ORDER — ALPRAZOLAM 0.5 MG PO TABS: 0.5000 mg | ORAL_TABLET | Freq: Three times a day (TID) | ORAL | Status: DC | PRN

## 2012-12-20 NOTE — Patient Instructions (Addendum)
Schedule your labs Follow up in 1 month to recheck BP Continue the Losartan STOP the hydrochlorothiazide We'll notify you of your lab results and make any changes if needed SCHEDULE your mammo! Call with any questions or concerns Hang in there!!!

## 2012-12-20 NOTE — Progress Notes (Signed)
  Subjective:    Patient ID: Rachael Buck, female    DOB: 06-12-1941, 72 y.o.   MRN: [id number]  HPI Here today for CPE.  Risk Factors: DM- chronic problem, on metformin.  Attempting to follow low carb diet.  Limited exercise.  UTD on eye exam.  No symptomatic lows. HTN- chronic problem, excellent control today w/ HCTZ, Cozaar.  No CP, SOB, HAs, visual changes, edema. Hyperlipidemia- chronic problem, on Zocor and Questran.  Denies abd pain, N/V, myalgias Physical Activity: limited activity Fall Risk: low Depression: ongoing problem, using Xanax prn Hearing: normal to conversational tones and whispered voice ADL's: independent Cognitive: normal linear thought process, memory and attention intact Home Safety: safe at home, lives w/ husband Height, Weight, BMI, Visual Acuity: see vitals, vision corrected to 20/20 w/ glasses Counseling: UTD on colonoscopy, due for DEXA (wants to wait another year) and mammo Labs Ordered: See A&P Care Plan: See A&P    Review of Systems Patient reports no vision/ hearing changes, adenopathy,fever, weight change,  persistant/recurrent hoarseness , swallowing issues, chest pain, palpitations, edema, persistant/recurrent cough, hemoptysis, dyspnea (rest/exertional/paroxysmal nocturnal), gastrointestinal bleeding (melena, rectal bleeding), abdominal pain, significant heartburn, bowel changes, GU symptoms (dysuria, hematuria, incontinence), Gyn symptoms (abnormal  bleeding, pain),  syncope, focal weakness, memory loss, numbness & tingling, skin/hair/nail changes, abnormal bruising or bleeding, anxiety, or depression.     Objective:   Physical Exam General Appearance:    Alert, cooperative, no distress, appears stated age  Head:    Normocephalic, without obvious abnormality, atraumatic  Eyes:    PERRL, conjunctiva/corneas clear, EOM's intact, fundi    benign, both eyes  Ears:    Normal TM's and external ear canals, both ears  Nose:   Nares normal, septum  midline, mucosa normal, no drainage    or sinus tenderness  Throat:   Lips, mucosa, and tongue normal; teeth and gums normal  Neck:   Supple, symmetrical, trachea midline, no adenopathy;    Thyroid: no enlargement/tenderness/nodules  Back:     Symmetric, no curvature, ROM normal, no CVA tenderness  Lungs:     Clear to auscultation bilaterally, respirations unlabored  Chest Wall:    No tenderness or deformity   Heart:    Regular rate and rhythm, S1 and S2 normal, no murmur, rub   or gallop  Breast Exam:    Deferred to mammo  Abdomen:     Soft, non-tender, bowel sounds active all four quadrants,    no masses, + hepatomegaly  Genitalia:    Deferred due to hysterectomy  Rectal:    Extremities:   Extremities normal, atraumatic, no cyanosis or edema  Pulses:   2+ and symmetric all extremities  Skin:   Skin color, texture, turgor normal, no rashes or lesions  Lymph nodes:   Cervical, supraclavicular, and axillary nodes normal  Neurologic:   CNII-XII intact, normal strength, sensation and reflexes    throughout          Assessment & Plan:

## 2012-12-22 NOTE — Assessment & Plan Note (Signed)
Chronic problem.  Excellent control today.  Asymptomatic.  Continue ARB but will attempt trial off HCTZ.  Check labs.  Will follow closely.

## 2012-12-22 NOTE — Assessment & Plan Note (Signed)
Chronic problem.  Tolerating statin w/out difficulty.  Check labs.  Adjust meds prn  

## 2012-12-22 NOTE — Assessment & Plan Note (Addendum)
Pt's PE WNL w/ exception of obesity and hepatomegaly.  UTD on colonoscopy.  Pt to schedule mammo.  Declines DEXA this year.  Check labs.  Anticipatory guidance provided.

## 2012-12-22 NOTE — Assessment & Plan Note (Signed)
Chronic problem.  UTD on eye exam.  Asymptomatic.  Again encouraged low carb diet.  Check labs.  Adjust meds prn

## 2012-12-25 DIAGNOSIS — R141 Gas pain: Secondary | ICD-10-CM | POA: Diagnosis not present

## 2012-12-25 DIAGNOSIS — R197 Diarrhea, unspecified: Secondary | ICD-10-CM | POA: Diagnosis not present

## 2012-12-28 ENCOUNTER — Other Ambulatory Visit (INDEPENDENT_AMBULATORY_CARE_PROVIDER_SITE_OTHER): Payer: Medicare Other

## 2012-12-28 DIAGNOSIS — Z0000 Encounter for general adult medical examination without abnormal findings: Secondary | ICD-10-CM

## 2012-12-28 DIAGNOSIS — E2839 Other primary ovarian failure: Secondary | ICD-10-CM

## 2012-12-28 DIAGNOSIS — E782 Mixed hyperlipidemia: Secondary | ICD-10-CM | POA: Diagnosis not present

## 2012-12-28 DIAGNOSIS — I10 Essential (primary) hypertension: Secondary | ICD-10-CM

## 2012-12-28 DIAGNOSIS — E1165 Type 2 diabetes mellitus with hyperglycemia: Secondary | ICD-10-CM

## 2012-12-28 DIAGNOSIS — IMO0001 Reserved for inherently not codable concepts without codable children: Secondary | ICD-10-CM

## 2012-12-28 LAB — BASIC METABOLIC PANEL
BUN: 11 mg/dL (ref 6–23)
CO2: 29 mEq/L (ref 19–32)
Calcium: 9.1 mg/dL (ref 8.4–10.5)
Chloride: 95 mEq/L — ABNORMAL LOW (ref 96–112)
Creatinine, Ser: 0.6 mg/dL (ref 0.4–1.2)
GFR: 106.94 mL/min (ref >60.00)
Glucose, Bld: 131 mg/dL — ABNORMAL HIGH (ref 70–99)
Potassium: 3.2 mEq/L — ABNORMAL LOW (ref 3.5–5.1)
Sodium: 132 mEq/L — ABNORMAL LOW (ref 135–145)

## 2012-12-28 LAB — CBC WITH DIFFERENTIAL/PLATELET
Basophils Absolute: 0 10*3/uL (ref 0.0–0.1)
Basophils Relative: 0.4 % (ref 0.0–3.0)
Eosinophils Absolute: 0.1 10*3/uL (ref 0.0–0.7)
Eosinophils Relative: 0.9 % (ref 0.0–5.0)
HCT: 37.8 % (ref 36.0–46.0)
Hemoglobin: 12.9 g/dL (ref 12.0–15.0)
Lymphocytes Relative: 35.4 % (ref 12.0–46.0)
Lymphs Abs: 3.1 10*3/uL (ref 0.7–4.0)
MCHC: 34 g/dL (ref 30.0–36.0)
MCV: 88 fl (ref 78.0–100.0)
Monocytes Absolute: 0.6 10*3/uL (ref 0.1–1.0)
Monocytes Relative: 6.9 % (ref 3.0–12.0)
Neutro Abs: 5 10*3/uL (ref 1.4–7.7)
Neutrophils Relative %: 56.4 % (ref 43.0–77.0)
Platelets: 132 10*3/uL — ABNORMAL LOW (ref 150.0–400.0)
RBC: 4.3 Mil/uL (ref 3.87–5.11)
RDW: 14.9 % — ABNORMAL HIGH (ref 11.5–14.6)
WBC: 8.8 10*3/uL (ref 4.5–10.5)

## 2012-12-28 LAB — HEPATIC FUNCTION PANEL
ALT: 24 U/L (ref 0–35)
AST: 40 U/L — ABNORMAL HIGH (ref 0–37)
Albumin: 4.1 g/dL (ref 3.5–5.2)
Alkaline Phosphatase: 55 U/L (ref 39–117)
Bilirubin, Direct: 0.2 mg/dL (ref 0.0–0.3)
Total Bilirubin: 1.2 mg/dL (ref 0.3–1.2)
Total Protein: 7.7 g/dL (ref 6.0–8.3)

## 2012-12-28 LAB — LIPID PANEL
Cholesterol: 113 mg/dL (ref 0–200)
HDL: 45.5 mg/dL (ref >39.00)
LDL Cholesterol: 31 mg/dL (ref 0–99)
Total CHOL/HDL Ratio: 2
Triglycerides: 184 mg/dL — ABNORMAL HIGH (ref 0.0–149.0)
VLDL: 36.8 mg/dL (ref 0.0–40.0)

## 2012-12-28 LAB — TSH: TSH: 1.23 u[IU]/mL (ref 0.35–5.50)

## 2012-12-28 LAB — HEMOGLOBIN A1C: Hgb A1c MFr Bld: 6.7 % — ABNORMAL HIGH (ref 4.6–6.5)

## 2013-01-01 LAB — VITAMIN D 1,25 DIHYDROXY
Vitamin D 1, 25 (OH)2 Total: 39 pg/mL (ref 18–72)
Vitamin D2 1, 25 (OH)2: <8 pg/mL
Vitamin D3 1, 25 (OH)2: 39 pg/mL

## 2013-01-19 ENCOUNTER — Ambulatory Visit: Payer: Medicare Other | Admitting: Family Medicine

## 2013-01-29 ENCOUNTER — Ambulatory Visit (INDEPENDENT_AMBULATORY_CARE_PROVIDER_SITE_OTHER): Payer: Medicare Other | Admitting: Family Medicine

## 2013-01-29 ENCOUNTER — Encounter: Payer: Self-pay | Admitting: Family Medicine

## 2013-01-29 DIAGNOSIS — J45909 Unspecified asthma, uncomplicated: Secondary | ICD-10-CM | POA: Insufficient documentation

## 2013-01-29 DIAGNOSIS — J329 Chronic sinusitis, unspecified: Secondary | ICD-10-CM | POA: Diagnosis not present

## 2013-01-29 DIAGNOSIS — J45901 Unspecified asthma with (acute) exacerbation: Secondary | ICD-10-CM

## 2013-01-29 DIAGNOSIS — J209 Acute bronchitis, unspecified: Secondary | ICD-10-CM | POA: Insufficient documentation

## 2013-01-29 MED ORDER — PROMETHAZINE-DM 6.25-15 MG/5ML PO SYRP: 5.0000 mL | ORAL_SOLUTION | Freq: Four times a day (QID) | ORAL | Status: DC | PRN

## 2013-01-29 MED ORDER — AMOXICILLIN 875 MG PO TABS: 875.0000 mg | ORAL_TABLET | Freq: Two times a day (BID) | ORAL | Status: DC

## 2013-01-29 NOTE — Progress Notes (Signed)
  Subjective:    Patient ID: Rachael Buck, female    DOB: February 25, 1941, 72 y.o.   MRN: [id number]  HPI URI- pt reports 'coughing my head off for a week and a half'.  Used Zycam crystals, inhaler, benadryl.  Started coughing up BRB 4 days ago.  Occuring 2-3x/day.  'not a whole lot'.  No fever.  + sinus pain/pressure.  sxs started w/ sore throat.  Bilateral ear fullness.  Cough is productive of phlegm.  No N/V/D.  No known sick contacts.  Hx of asthma, hx of esophageal varices.   Review of Systems For ROS see HPI     Objective:   Physical Exam  Vitals reviewed. Constitutional: She appears well-developed and well-nourished. No distress.  HENT:  Head: Normocephalic and atraumatic.  Right Ear: Tympanic membrane normal.  Left Ear: Tympanic membrane normal.  Nose: Mucosal edema and rhinorrhea present. Right sinus exhibits maxillary sinus tenderness and frontal sinus tenderness. Left sinus exhibits maxillary sinus tenderness and frontal sinus tenderness.  Mouth/Throat: Uvula is midline and mucous membranes are normal. Posterior oropharyngeal erythema present. No oropharyngeal exudate.  Eyes: Conjunctivae and EOM are normal. Pupils are equal, round, and reactive to light.  Neck: Normal range of motion. Neck supple.  Cardiovascular: Normal rate, regular rhythm and normal heart sounds.   Pulmonary/Chest: Effort normal. No respiratory distress. She has wheezes (faint, scattered expiratory wheezes).  No cough heard  Lymphadenopathy:    She has no cervical adenopathy.          Assessment & Plan:

## 2013-01-29 NOTE — Assessment & Plan Note (Signed)
Pt's sxs consistent w/ asthmatic bronchitis.  Concerned about hemoptysis- particularly in setting of esophageal varices.  Cough meds prn.  If blood persists, pt will require minimally an xray and likely a CT scan.  Discussed this w/ pt and offered imaging today- pt declines and prefers to tx bronchitis first and then pursue w/u if needed.

## 2013-01-29 NOTE — Assessment & Plan Note (Signed)
Pt's sxs and PE consistent w/ infxn.  Start abx.  Reviewed supportive care and red flags that should prompt return.  Pt expressed understanding and is in agreement w/ plan.  

## 2013-01-29 NOTE — Patient Instructions (Addendum)
Start the Amoxicillin twice daily- take w/ food Drink plenty of fluids Use the cough syrup as needed- will cause drowsiness If the blood persists- call me! REST! Hang in there!!!

## 2013-03-05 ENCOUNTER — Other Ambulatory Visit: Payer: Self-pay | Admitting: Family Medicine

## 2013-03-06 ENCOUNTER — Other Ambulatory Visit: Payer: Self-pay

## 2013-03-06 DIAGNOSIS — Z1231 Encounter for screening mammogram for malignant neoplasm of breast: Secondary | ICD-10-CM

## 2013-03-06 NOTE — Telephone Encounter (Signed)
Rx printed and faxed to the pharmacy.//AB/CMA

## 2013-03-27 ENCOUNTER — Ambulatory Visit
Admission: RE | Admit: 2013-03-27 | Discharge: 2013-03-27 | Disposition: A | Discharge status: Home / Self Care | Payer: Medicare Other | Source: Ambulatory Visit

## 2013-03-27 DIAGNOSIS — Z1231 Encounter for screening mammogram for malignant neoplasm of breast: Secondary | ICD-10-CM | POA: Diagnosis not present

## 2013-04-11 ENCOUNTER — Other Ambulatory Visit: Payer: Self-pay | Admitting: Family Medicine

## 2013-04-11 NOTE — Telephone Encounter (Signed)
Last refill:03-06-13 Last OV:12-20-12  UDS:09-20-12-Low Risk Please advise.//AB/CMA

## 2013-05-14 ENCOUNTER — Other Ambulatory Visit: Payer: Self-pay | Admitting: Family Medicine

## 2013-05-14 ENCOUNTER — Ambulatory Visit (INDEPENDENT_AMBULATORY_CARE_PROVIDER_SITE_OTHER): Payer: Medicare Other | Admitting: Family Medicine

## 2013-05-14 ENCOUNTER — Encounter: Payer: Self-pay | Admitting: Family Medicine

## 2013-05-14 VITALS — BP 150/86 | HR 99 | Temp 98.2°F | Wt 216.0 lb

## 2013-05-14 DIAGNOSIS — K589 Irritable bowel syndrome without diarrhea: Secondary | ICD-10-CM | POA: Diagnosis not present

## 2013-05-14 DIAGNOSIS — I8500 Esophageal varices without bleeding: Secondary | ICD-10-CM | POA: Diagnosis not present

## 2013-05-14 DIAGNOSIS — I951 Orthostatic hypotension: Secondary | ICD-10-CM

## 2013-05-14 DIAGNOSIS — E119 Type 2 diabetes mellitus without complications: Secondary | ICD-10-CM

## 2013-05-14 DIAGNOSIS — K7460 Unspecified cirrhosis of liver: Secondary | ICD-10-CM | POA: Diagnosis not present

## 2013-05-14 MED ORDER — ONETOUCH DELICA LANCETS FINE MISC: 1.0000 | Freq: Two times a day (BID) | Status: DC

## 2013-05-14 MED ORDER — GLUCOSE BLOOD VI STRP: ORAL_STRIP | Status: DC

## 2013-05-14 NOTE — Patient Instructions (Addendum)
This is called Orthostatic Hypotension- blood pressure drops with position changes Make sure you increase your water intake Change positions slowly! Allow yourself time to adjust after getting up Call with any questions or concerns Enjoy your trip!

## 2013-05-14 NOTE — Assessment & Plan Note (Signed)
New.  Pt's sxs and vitals consistent w/ this.  Not currently on beta blocker.  Encouraged increased fluid intake, changing positions slowly, and allowing time to adjust.  Reviewed supportive care and red flags that should prompt return.  Pt expressed understanding and is in agreement w/ plan.

## 2013-05-14 NOTE — Progress Notes (Signed)
  Subjective:    Patient ID: Rachael Buck, female    DOB: 03/04/1941, 72 y.o.   MRN: [id number]  HPI Dizziness- got up Saturday AM and 'turned to get housecoat and fell back across the bed'.  Also fell when trying to get up off the floor.  Only occuring w/ position changes.  No dizziness w/ turning of head.  Pt has had difficulty w/ inner ear previously.  No nausea.  sxs are short lived once pt is up and about.  Pt reports good water intake, also drinking unsweet tea.   Review of Systems For ROS see HPI     Objective:   Physical Exam  Vitals reviewed. Constitutional: She is oriented to person, place, and time. She appears well-developed and well-nourished. No distress.  HENT:  Head: Normocephalic and atraumatic.  Eyes: Conjunctivae and EOM are normal. Pupils are equal, round, and reactive to light.  Neck: Normal range of motion. Neck supple. No thyromegaly present.  Cardiovascular: Normal rate, regular rhythm, normal heart sounds and intact distal pulses.   Pulmonary/Chest: Effort normal and breath sounds normal. No respiratory distress. She has no wheezes. She has no rales.  Musculoskeletal: She exhibits no edema.  Neurological: She is alert and oriented to person, place, and time. No cranial nerve deficit. Coordination normal.  Skin: Skin is warm and dry.  Psychiatric: She has a normal mood and affect. Her behavior is normal.          Assessment & Plan:

## 2013-05-14 NOTE — Telephone Encounter (Signed)
Last filled: 04/11/13  Last visit: 01/29/13  UDS: 09/21/12-Low Risk   Contract on file  Please advise SW, CMA

## 2013-05-24 ENCOUNTER — Telehealth: Payer: Self-pay | Admitting: General Practice

## 2013-05-24 DIAGNOSIS — K7581 Nonalcoholic steatohepatitis (NASH): Secondary | ICD-10-CM

## 2013-05-24 NOTE — Telephone Encounter (Signed)
Pt called stating that Keenan Bachelor scott from [facility] was going to email [facility] regarding pt. He would like pt to have an [place] for an enlarged liver. Pt would prefer to go to [place] in [facility] if possible.

## 2013-05-24 NOTE — Telephone Encounter (Signed)
Yes, he asked that we order [place] for pt to evaluate liver and fax results.  Order in.

## 2013-05-24 NOTE — Telephone Encounter (Signed)
Noted  

## 2013-05-25 ENCOUNTER — Ambulatory Visit (HOSPITAL_BASED_OUTPATIENT_CLINIC_OR_DEPARTMENT_OTHER)
Admission: RE | Admit: 2013-05-25 | Discharge: 2013-05-25 | Disposition: A | Discharge status: Home / Self Care | Payer: Medicare Other | Source: Ambulatory Visit | Attending: Family Medicine | Admitting: Family Medicine

## 2013-05-25 DIAGNOSIS — Z9089 Acquired absence of other organs: Secondary | ICD-10-CM | POA: Diagnosis not present

## 2013-05-25 DIAGNOSIS — K7460 Unspecified cirrhosis of liver: Secondary | ICD-10-CM | POA: Insufficient documentation

## 2013-05-25 DIAGNOSIS — K7689 Other specified diseases of liver: Secondary | ICD-10-CM | POA: Diagnosis not present

## 2013-05-25 DIAGNOSIS — K7581 Nonalcoholic steatohepatitis (NASH): Secondary | ICD-10-CM

## 2013-05-29 ENCOUNTER — Telehealth: Payer: Self-pay | Admitting: General Practice

## 2013-05-29 ENCOUNTER — Encounter: Payer: Self-pay | Admitting: Internal Medicine

## 2013-05-29 NOTE — Telephone Encounter (Signed)
US shows cirrhosis (which we knew) and everything else looks good

## 2013-05-29 NOTE — Telephone Encounter (Signed)
Pt called stating that her [place] results should be back. I see where these were suppose to be faxed to [facility]. Is there anything I should advise pt on?

## 2013-05-29 NOTE — Telephone Encounter (Signed)
Spoke with pt and notified.

## 2013-06-14 ENCOUNTER — Other Ambulatory Visit: Payer: Self-pay | Admitting: Family Medicine

## 2013-06-14 DIAGNOSIS — Z23 Encounter for immunization: Secondary | ICD-10-CM | POA: Diagnosis not present

## 2013-06-14 NOTE — Telephone Encounter (Signed)
Med filled.  

## 2013-07-05 ENCOUNTER — Other Ambulatory Visit: Payer: Self-pay

## 2013-07-16 ENCOUNTER — Other Ambulatory Visit: Payer: Self-pay | Admitting: Family Medicine

## 2013-07-16 NOTE — Telephone Encounter (Signed)
Med filled.  

## 2013-07-16 NOTE — Telephone Encounter (Signed)
Last OV 05-14-13 Med filled 10-16  #90 with 0

## 2013-08-09 DIAGNOSIS — E669 Obesity, unspecified: Secondary | ICD-10-CM | POA: Diagnosis not present

## 2013-08-20 ENCOUNTER — Other Ambulatory Visit: Payer: Self-pay | Admitting: Family Medicine

## 2013-08-20 NOTE — Telephone Encounter (Signed)
Last OV 05-14-13 Med filled 11-17 #90 with 0

## 2013-08-20 NOTE — Telephone Encounter (Signed)
Med filled and faxed.  

## 2013-09-24 ENCOUNTER — Telehealth: Payer: Self-pay | Admitting: *Deleted

## 2013-09-24 MED ORDER — ALPRAZOLAM 0.5 MG PO TABS: ORAL_TABLET | ORAL | Status: DC

## 2013-09-24 NOTE — Telephone Encounter (Signed)
Last seen-05/14/2013  Last filled-08/20/2013  UDS-09/21/2012 low risk , contract signed  Please advise. SW

## 2013-09-24 NOTE — Telephone Encounter (Signed)
Phoned in.

## 2013-09-24 NOTE — Telephone Encounter (Signed)
[place] for #90, 1 refill 

## 2013-09-24 NOTE — Telephone Encounter (Signed)
Xanax filled 

## 2013-09-27 ENCOUNTER — Other Ambulatory Visit: Payer: Self-pay | Admitting: Family Medicine

## 2013-09-28 DIAGNOSIS — E669 Obesity, unspecified: Secondary | ICD-10-CM | POA: Diagnosis not present

## 2013-09-28 NOTE — Telephone Encounter (Signed)
Med filled.  

## 2013-11-14 DIAGNOSIS — K7689 Other specified diseases of liver: Secondary | ICD-10-CM | POA: Diagnosis not present

## 2013-11-14 DIAGNOSIS — K7460 Unspecified cirrhosis of liver: Secondary | ICD-10-CM | POA: Diagnosis not present

## 2013-11-14 DIAGNOSIS — I8500 Esophageal varices without bleeding: Secondary | ICD-10-CM | POA: Diagnosis not present

## 2013-11-14 DIAGNOSIS — K589 Irritable bowel syndrome without diarrhea: Secondary | ICD-10-CM | POA: Diagnosis not present

## 2013-11-27 ENCOUNTER — Other Ambulatory Visit: Payer: Self-pay | Admitting: Family Medicine

## 2013-11-27 NOTE — Telephone Encounter (Signed)
Last OV 05-14-13 Med filled 09-24-13 #90 with 1

## 2013-11-27 NOTE — Telephone Encounter (Signed)
Med filled and faxed.

## 2013-12-04 ENCOUNTER — Emergency Department (HOSPITAL_COMMUNITY): Payer: Medicare Other

## 2013-12-04 ENCOUNTER — Encounter: Payer: Self-pay | Admitting: Family Medicine

## 2013-12-04 ENCOUNTER — Emergency Department (HOSPITAL_COMMUNITY)
Admission: EM | Admit: 2013-12-04 | Discharge: 2013-12-04 | Disposition: A | Discharge status: Home / Self Care | Payer: Medicare Other | Attending: Emergency Medicine | Admitting: Emergency Medicine

## 2013-12-04 ENCOUNTER — Ambulatory Visit (INDEPENDENT_AMBULATORY_CARE_PROVIDER_SITE_OTHER): Payer: Medicare Other | Admitting: Family Medicine

## 2013-12-04 ENCOUNTER — Encounter (HOSPITAL_COMMUNITY): Payer: Self-pay | Admitting: Emergency Medicine

## 2013-12-04 VITALS — BP 132/74 | HR 101 | Temp 98.4°F | Resp 16 | Wt 212.5 lb

## 2013-12-04 DIAGNOSIS — J45901 Unspecified asthma with (acute) exacerbation: Secondary | ICD-10-CM | POA: Diagnosis not present

## 2013-12-04 DIAGNOSIS — K7030 Alcoholic cirrhosis of liver without ascites: Secondary | ICD-10-CM | POA: Diagnosis not present

## 2013-12-04 DIAGNOSIS — Z90711 Acquired absence of uterus with remaining cervical stump: Secondary | ICD-10-CM | POA: Insufficient documentation

## 2013-12-04 DIAGNOSIS — K219 Gastro-esophageal reflux disease without esophagitis: Secondary | ICD-10-CM | POA: Insufficient documentation

## 2013-12-04 DIAGNOSIS — R142 Eructation: Secondary | ICD-10-CM | POA: Insufficient documentation

## 2013-12-04 DIAGNOSIS — R739 Hyperglycemia, unspecified: Secondary | ICD-10-CM

## 2013-12-04 DIAGNOSIS — E876 Hypokalemia: Secondary | ICD-10-CM | POA: Insufficient documentation

## 2013-12-04 DIAGNOSIS — R143 Flatulence: Secondary | ICD-10-CM | POA: Diagnosis not present

## 2013-12-04 DIAGNOSIS — I10 Essential (primary) hypertension: Secondary | ICD-10-CM | POA: Insufficient documentation

## 2013-12-04 DIAGNOSIS — R141 Gas pain: Secondary | ICD-10-CM | POA: Diagnosis not present

## 2013-12-04 DIAGNOSIS — Z860100 Personal history of colon polyps, unspecified: Secondary | ICD-10-CM | POA: Insufficient documentation

## 2013-12-04 DIAGNOSIS — R109 Unspecified abdominal pain: Secondary | ICD-10-CM

## 2013-12-04 DIAGNOSIS — R0602 Shortness of breath: Secondary | ICD-10-CM | POA: Diagnosis not present

## 2013-12-04 DIAGNOSIS — IMO0002 Reserved for concepts with insufficient information to code with codable children: Secondary | ICD-10-CM | POA: Diagnosis not present

## 2013-12-04 DIAGNOSIS — K7460 Unspecified cirrhosis of liver: Secondary | ICD-10-CM

## 2013-12-04 DIAGNOSIS — Z87891 Personal history of nicotine dependence: Secondary | ICD-10-CM | POA: Diagnosis not present

## 2013-12-04 DIAGNOSIS — R140 Abdominal distension (gaseous): Secondary | ICD-10-CM

## 2013-12-04 DIAGNOSIS — E669 Obesity, unspecified: Secondary | ICD-10-CM | POA: Diagnosis not present

## 2013-12-04 DIAGNOSIS — E785 Hyperlipidemia, unspecified: Secondary | ICD-10-CM | POA: Insufficient documentation

## 2013-12-04 DIAGNOSIS — Z79899 Other long term (current) drug therapy: Secondary | ICD-10-CM | POA: Insufficient documentation

## 2013-12-04 DIAGNOSIS — E119 Type 2 diabetes mellitus without complications: Secondary | ICD-10-CM | POA: Diagnosis not present

## 2013-12-04 DIAGNOSIS — Z8739 Personal history of other diseases of the musculoskeletal system and connective tissue: Secondary | ICD-10-CM | POA: Insufficient documentation

## 2013-12-04 DIAGNOSIS — K5730 Diverticulosis of large intestine without perforation or abscess without bleeding: Secondary | ICD-10-CM | POA: Diagnosis not present

## 2013-12-04 HISTORY — DX: Unspecified cirrhosis of liver: K74.60

## 2013-12-04 LAB — COMPREHENSIVE METABOLIC PANEL
ALT: 21 U/L (ref 0–35)
AST: 29 U/L (ref 0–37)
Albumin: 4 g/dL (ref 3.5–5.2)
Alkaline Phosphatase: 59 U/L (ref 39–117)
BUN: 9 mg/dL (ref 6–23)
CO2: 26 mEq/L (ref 19–32)
Calcium: 9.2 mg/dL (ref 8.4–10.5)
Chloride: 94 mEq/L — ABNORMAL LOW (ref 96–112)
Creatinine, Ser: 0.51 mg/dL (ref 0.50–1.10)
GFR calc Af Amer: >90 mL/min (ref >90)
GFR calc non Af Amer: >90 mL/min (ref >90)
Glucose, Bld: 184 mg/dL — ABNORMAL HIGH (ref 70–99)
Potassium: 3.2 mEq/L — ABNORMAL LOW (ref 3.7–5.3)
Sodium: 136 mEq/L — ABNORMAL LOW (ref 137–147)
Total Bilirubin: 1 mg/dL (ref 0.3–1.2)
Total Protein: 7.4 g/dL (ref 6.0–8.3)

## 2013-12-04 LAB — CBC WITH DIFFERENTIAL/PLATELET
Basophils Absolute: 0 10*3/uL (ref 0.0–0.1)
Basophils Relative: 0 % (ref 0–1)
Eosinophils Absolute: 0.1 10*3/uL (ref 0.0–0.7)
Eosinophils Relative: 1 % (ref 0–5)
HCT: 34 % — ABNORMAL LOW (ref 36.0–46.0)
Hemoglobin: 11.9 g/dL — ABNORMAL LOW (ref 12.0–15.0)
Lymphocytes Relative: 36 % (ref 12–46)
Lymphs Abs: 2.5 10*3/uL (ref 0.7–4.0)
MCH: 29.5 pg (ref 26.0–34.0)
MCHC: 35 g/dL (ref 30.0–36.0)
MCV: 84.2 fL (ref 78.0–100.0)
Monocytes Absolute: 0.4 10*3/uL (ref 0.1–1.0)
Monocytes Relative: 6 % (ref 3–12)
Neutro Abs: 4 10*3/uL (ref 1.7–7.7)
Neutrophils Relative %: 57 % (ref 43–77)
Platelets: 123 10*3/uL — ABNORMAL LOW (ref 150–400)
RBC: 4.04 MIL/uL (ref 3.87–5.11)
RDW: 14.5 % (ref 11.5–15.5)
WBC: 7 10*3/uL (ref 4.0–10.5)

## 2013-12-04 LAB — URINALYSIS, ROUTINE W REFLEX MICROSCOPIC
Bilirubin Urine: NEGATIVE
Glucose, UA: NEGATIVE mg/dL
Hgb urine dipstick: NEGATIVE
Ketones, ur: NEGATIVE mg/dL
Leukocytes, UA: NEGATIVE
Nitrite: NEGATIVE
Protein, ur: NEGATIVE mg/dL
Specific Gravity, Urine: 1.016 (ref 1.005–1.030)
Urobilinogen, UA: 1 mg/dL (ref 0.0–1.0)
pH: 7.5 (ref 5.0–8.0)

## 2013-12-04 LAB — PROTIME-INR
INR: 1.1 (ref 0.00–1.49)
Prothrombin Time: 14 seconds (ref 11.6–15.2)

## 2013-12-04 LAB — LIPASE, BLOOD: Lipase: 23 U/L (ref 11–59)

## 2013-12-04 MED ORDER — POTASSIUM CHLORIDE CRYS ER 20 MEQ PO TBCR
40.0000 meq | EXTENDED_RELEASE_TABLET | Freq: Once | ORAL | Status: AC
  Administered 2013-12-04: 40 meq via ORAL
  Filled 2013-12-04: qty 2

## 2013-12-04 MED ORDER — OXYCODONE HCL 5 MG PO TABS: 5.0000 mg | ORAL_TABLET | ORAL | Status: DC | PRN

## 2013-12-04 MED ORDER — IOHEXOL 300 MG/ML  SOLN
100.0000 mL | Freq: Once | INTRAMUSCULAR | Status: AC | PRN
  Administered 2013-12-04: 100 mL via INTRAVENOUS

## 2013-12-04 MED ORDER — HYDROMORPHONE HCL PF 1 MG/ML IJ SOLN
0.5000 mg | INTRAMUSCULAR | Status: DC | PRN
  Administered 2013-12-04: 0.5 mg via INTRAVENOUS
  Filled 2013-12-04: qty 1

## 2013-12-04 MED ORDER — ONDANSETRON HCL 4 MG/2ML IJ SOLN
4.0000 mg | Freq: Once | INTRAMUSCULAR | Status: AC
  Administered 2013-12-04: 4 mg via INTRAVENOUS
  Filled 2013-12-04: qty 2

## 2013-12-04 MED ORDER — IOHEXOL 300 MG/ML  SOLN
50.0000 mL | Freq: Once | INTRAMUSCULAR | Status: AC | PRN
  Administered 2013-12-04: 50 mL via ORAL

## 2013-12-04 NOTE — Progress Notes (Signed)
   Subjective:    Patient ID: BAY JARQUIN, female    DOB: May 25, 1941, 73 y.o.   MRN: [id number]  HPI abd pain- sxs started 10 days ago, 'it's constant'.  Saw GI doctor at [facility] in March but didn't have pain at that time.  Would like to have [place] GI doctor since GI fellow is graduating in May.  No N/V.  Having abdominal distention.  Pain radiates across abdomen into back.  No fevers.  Increased SOB due to fluid.   Review of Systems For ROS see HPI     Objective:   Physical Exam  Vitals reviewed. Constitutional: She appears well-developed and well-nourished.  Obviously uncomfortable  Cardiovascular:  Tachy but regular S1/S2  Pulmonary/Chest:  SOB when speaking in complete sentences, no obvious crackles on lung exam  Abdominal: She exhibits distension (increasing). There is tenderness (severe TTP over entire abdomen). There is guarding (voluntary). There is no rebound.  + hepatomegaly          Assessment & Plan:

## 2013-12-04 NOTE — Discharge Instructions (Signed)
Cirrhosis Cirrhosis is a condition of scarring of the liver which is caused when the liver has tried repairing itself following damage. This damage may come from a previous infection such as one of the forms of hepatitis (usually hepatitis C), or the damage may come from being injured by toxins. The main toxin that causes this damage is alcohol. The scarring of the liver from use of alcohol is irreversible. That means the liver cannot return to normal even though alcohol is not used any more. The main danger of hepatitis C infection is that it may cause long-lasting (chronic) liver disease, and this also may lead to cirrhosis. This complication is progressive and irreversible. CAUSES  Prior to available blood tests, hepatitis C could be contracted by blood transfusions. Since testing of blood has improved, this is now unlikely. This infection can also be contracted through intravenous drug use and the sharing of needles. It can also be contracted through sexual relationships. The injury caused by alcohol comes from too much use. It is not a few drinks that poison the liver, but years of misuse. Usually there will be some signs and symptoms early with scarring of the liver that suggest the development of better habits. Alcohol should never be used while using acetaminophen. A small dose of both taken together may cause irreversible damage to the liver. HOME CARE INSTRUCTIONS  There is no specific treatment for cirrhosis. However, there are things you can do to avoid making the condition worse.  Rest as needed.  Eat a well-balanced diet. Your caregiver can help you with suggestions.  Vitamin supplements including vitamins A, K, D, and thiamine can help.  A low-salt diet, water restriction, or diuretic medicine may be needed to reduce fluid retention.  Avoid alcohol. This can be extremely toxic if combined with acetaminophen.  Avoid drugs which are toxic to the liver. Some of these include isoniazid,  methyldopa, acetaminophen, anabolic steroids (muscle-building drugs), erythromycin, and oral contraceptives (birth control pills). Check with your caregiver to make sure medicines you are presently taking will not be harmful.  Periodic blood tests may be required. Follow your caregiver's advice regarding the timing of these.  Milk thistle is an herbal remedy which does protect the liver against toxins. However, it will not help once the liver has been scarred. SEEK MEDICAL CARE IF:  You have increasing fatigue or weakness.  You develop swelling of the hands, feet, legs, or face.  You vomit bright red blood, or a coffee ground appearing material.  You have blood in your stools, or the stools turn black and tarry.  You have a fever.  You develop loss of appetite, or have nausea and vomiting.  You develop jaundice.  You develop easy bruising or bleeding.  You have worsening of any of the problems you are concerned about. Document Released: 08/16/2005 Document Revised: 11/08/2011 Document Reviewed: 04/03/2008 [facility] Patient Information 2014 [place].

## 2013-12-04 NOTE — Assessment & Plan Note (Signed)
Deteriorated.  Pt is more distended than previous and now having pain.  Pt w/ known hx of cirrhosis, IBS, abdominal surgery which makes differential broad (SBP, bowel obstruction, IBS flare).  Spoke w/ pt's GI doctor Juel Burrow, MD) at [facility] and he recommended ER evaluation w/ CBC, CMP, Amylase, Lipase, CT abd/pelvis w/ contrast (if kidney function can tolerate).  Called pt and communicated this to her and she states she will go to [facility] ER for evaluation.

## 2013-12-04 NOTE — Progress Notes (Signed)
Pre visit review using our clinic review tool, if applicable. No additional management support is needed unless otherwise documented below in the visit note. 

## 2013-12-04 NOTE — Assessment & Plan Note (Signed)
See above.  After waiting for over an hour in the office for GI to call back, pt went home.  After speaking w/ GI at [facility] and getting their recommendations, pt was called and will be going to ER for evaluation.  Labs were not done at time of OV at pt's request due to possibility of ER/hospital admission.

## 2013-12-04 NOTE — ED Notes (Addendum)
Hx of nonalcoholic cirrhosis of liver. Pt reports she was seen at [facility] today, pt is bloated, SOB and abdominal pain x1 week. Pain 6/10. Pt sent to ED to get blood work and CT scan. Last bowel movement. Denies n/v/d.

## 2013-12-04 NOTE — ED Notes (Signed)
Bed: WA08 Expected date:  Expected time:  Means of arrival:  Comments: Rachael Buck

## 2013-12-04 NOTE — ED Provider Notes (Signed)
CSN: [id number]     Arrival date & time 12/04/13  1311 History   First MD Initiated Contact with Patient 12/04/13 1356     Chief Complaint  Patient presents with  . Abdominal Pain    Patient is a 73 y.o. female presenting with abdominal pain. The history is provided by the patient.  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 She has never had trouble with ascites. Over the last week and a half she's had increasing abdominal pain. Patient does feel bloated and she's also felt short of breath. She has not had trouble with vomiting or diarrhea. He denies chest pain. She went to her doctor to consult with her GI doctor at [facility]. This patient to come to the emergency room to have laboratory tests and CT scan performed.  Past Medical History  Diagnosis Date  . Arthritis   . Asthma   . Diabetes mellitus   . Diverticulitis   . GERD (gastroesophageal reflux disease)   . Hypertension   . Colon polyps   . NASH (nonalcoholic steatohepatitis)   . Hyperlipidemia   . Obesity   . Cirrhosis, nonalcoholic    Past Surgical History  Procedure Laterality Date  . Cholecystectomy  1981  . Partial hysterectomy  1972  . Oophorectomy  1973  . Rotator cuff repair      bilateral. 2006, 2008  . Vein surgery     Family History  Problem Relation Age of Onset  . Breast cancer Daughter   . Breast cancer Maternal Aunt   . Diabetes Brother   . Diabetes Sister   . Stomach cancer Mother   . Cancer Other   . GER disease Other   . Obesity Other    History  Substance Use Topics  . Smoking status: Former Research scientist (life sciences)  . Smokeless tobacco: Not on file  . Alcohol Use: No   OB  History   Grav Para Term Preterm Abortions TAB SAB Ect Mult Living                 Review of Systems  Constitutional: Negative for fever.  Respiratory: Positive for shortness of breath.   Gastrointestinal: Positive for abdominal pain. Negative for vomiting and diarrhea.  All other systems reviewed and are negative.      Allergies  Codeine and Shrimp  Home Medications   Current Outpatient Rx  Name  Route  Sig  Dispense  Refill  . ALPRAZolam (XANAX) 0.5 MG tablet      TAKE 1 TABLET BY MOUTH THREE TIMES DAILY AS NEEDED FOR SLEEP AND ANXIETY   90 tablet   0   . glucose blood (ONE TOUCH ULTRA TEST) test strip      Use as instructed   100 each   12   . hydrochlorothiazide (HYDRODIURIL) 25 MG tablet      TAKE 1 TABLET EVERY DAY   90 tablet   3   . hydrocortisone 2.5 % cream   Topical   Apply topically 2 (two) times daily. Apply to affected area twice a day.   60 g   3   . hyoscyamine (LEVSIN, ANASPAZ) 0.125  MG tablet   Oral   Take 0.125 mg by mouth every 4 (four) hours as needed for cramping.         . Loperamide HCl (IMODIUM PO)   Oral   Take by mouth as needed.           Marland Kitchen losartan (COZAAR) 50 MG tablet      TAKE 1 TABLET EVERY DAY   90 tablet   3   . metFORMIN (GLUCOPHAGE) 500 MG tablet      TAKE 2 TABLETS TWICE DAILY  WITH  A  MEAL   360 tablet   3   . omeprazole (PRILOSEC) 40 MG capsule      TAKE 1 CAPSULE EVERY DAY   90 capsule   3   . ONETOUCH DELICA LANCETS FINE MISC   Does not apply   1 Stick by Does not apply route 2 (two) times daily.   100 each   3   . sertraline (ZOLOFT) 50 MG tablet      TAKE 1 TABLET EVERY DAY   90 tablet   1   . simvastatin (ZOCOR) 80 MG tablet      TAKE 1 TABLET EVERY DAY   90 tablet   3    BP 105/69  Pulse 101  Temp(Src) 98.7 F (37.1 C) (Oral)  Resp 16  SpO2 96% Physical Exam  Nursing note and vitals reviewed. Constitutional: She appears well-developed and well-nourished. No distress.   overweight  HENT:  Head: Normocephalic and atraumatic.  Right Ear: External ear normal.  Left Ear: External ear normal.  Eyes: Conjunctivae are normal. Right eye exhibits no discharge. Left eye exhibits no discharge. No scleral icterus.  Neck: Neck supple. No tracheal deviation present.  Cardiovascular: Normal rate, regular rhythm and intact distal pulses.   Pulmonary/Chest: Effort normal and breath sounds normal. No stridor. No respiratory distress. She has no wheezes. She has no rales.  Abdominal: Soft. Bowel sounds are normal. She exhibits no distension, no fluid wave and no ascites. There is generalized tenderness. There is guarding. There is no rigidity and no rebound. No hernia.  Musculoskeletal: She exhibits no edema and no tenderness.  Neurological: She is alert. She has normal strength. No cranial nerve deficit (no facial droop, extraocular movements intact, no slurred speech) or sensory deficit. She exhibits normal muscle tone. She displays no seizure activity. Coordination normal.  Skin: Skin is warm and dry. No rash noted.  Psychiatric: She has a normal mood and affect.    ED Course  Procedures (including critical care time) Labs Review Labs Reviewed  CBC WITH DIFFERENTIAL - Abnormal; Notable for the following:    Hemoglobin 11.9 (*)    HCT 34.0 (*)    Platelets 123 (*)    All other components within normal limits  COMPREHENSIVE METABOLIC PANEL - Abnormal; Notable for the following:    Sodium 136 (*)    Potassium 3.2 (*)    Chloride 94 (*)    Glucose, Bld 184 (*)    All other components within normal limits  LIPASE, BLOOD  URINALYSIS, ROUTINE W REFLEX MICROSCOPIC  PROTIME-INR   Imaging Review Dg Chest 2 View  12/04/2013   CLINICAL DATA:  Lower abdominal pain, shortness of breath, hypertension, cirrhosis  EXAM: CHEST  2 VIEW  COMPARISON:  12/22/2010  FINDINGS: The heart size and mediastinal contours are within normal limits. Both lungs are clear. The visualized  skeletal structures are unremarkable. Degenerative changes of the thoracic spine.  IMPRESSION: No active cardiopulmonary disease.   Electronically Signed   By: Daryll Brod M.D.   On: 12/04/2013 14:39   Ct Abdomen Pelvis W Contrast  12/04/2013   CLINICAL DATA:  Abdominal pain. Shortness breath. Nonalcoholic cirrhosis.  EXAM: CT ABDOMEN AND PELVIS WITH CONTRAST  TECHNIQUE: Multidetector CT imaging of the abdomen and pelvis was performed using the standard protocol following bolus administration of intravenous contrast.  CONTRAST:  168m OMNIPAQUE IOHEXOL 300 MG/ML  SOLN  COMPARISON:  [place] COM[place] dated 05/25/2013; CT ABD/PELVIS W CM dated 01/17/2012  FINDINGS: Minimal atelectasis is present at the lung bases but laterally. The heart size is upper limits of normal. No significant pleural or pericardial effusion is present.  Cirrhosis is again noted. Mild intra and extrahepatic biliary dilation is stable following cholecystectomy. The common bile duct measures 10 mm maximally. No obstructing lesion is evident. The liver is mildly enlarged, similar to the prior exam. The portal vein appears patent.  The adrenal glands are normal bilaterally. Kidneys and ureters are within normal limits bilaterally. The urinary bladder is unremarkable. Atherosclerotic calcifications are present within the abdominal aorta without aneurysm.  Diverticular changes are present within the sigmoid colon. No focal inflammation is present to suggest diverticulitis. The remainder the colon is within normal limits. The appendix is visualized and unremarkable. Small bowel is within normal limits. No significant adenopathy or free fluid is present.  Degenerative changes are noted in the lower lumbar spine as before, most evident at L5-S1.  IMPRESSION: 1. No acute or focal abnormality to explain the patient's symptoms. 2. Cirrhosis and splenomegaly without interval change. The portal vein is patent. 3. Sigmoid diverticulosis without evidence  for diverticulitis. 4. Extensive atherosclerotic changes without aneurysm. 5. Moderate degenerative changes in the lower lumbar spine, particularly at L5-S1.   Electronically Signed   By: CLawrence SantiagoM.D.   On: 12/04/2013 16:17     MDM   Final diagnoses:  Cirrhosis  Hypokalemia  Hyperglycemia   Pt's labs and CT scan are reassuring.  No diverticulitis, appendicitis, pancreatitis, or ascites noted on CT scan.  Etiology of her symptoms unclear.  Follow up with PCP and GI MD at [facility].  At this time there does not appear to be any evidence of an acute emergency medical condition and the patient appears stable for discharge with appropriate outpatient follow up.   JKathalene Frames MD 12/04/13 [id number]

## 2013-12-12 ENCOUNTER — Telehealth: Payer: Self-pay | Admitting: Family Medicine

## 2013-12-12 DIAGNOSIS — E119 Type 2 diabetes mellitus without complications: Secondary | ICD-10-CM | POA: Diagnosis not present

## 2013-12-12 DIAGNOSIS — K7581 Nonalcoholic steatohepatitis (NASH): Secondary | ICD-10-CM

## 2013-12-12 LAB — HM DIABETES EYE EXAM

## 2013-12-12 NOTE — Telephone Encounter (Signed)
Pt had originally advised that she wanted to stay with [facility] until her provider graduates. Referral placed for local gi doctor.

## 2013-12-12 NOTE — Telephone Encounter (Signed)
Patient called to check on a referral to Gastroenterology but there is no referral entered into the system for her. Please advise.

## 2013-12-28 ENCOUNTER — Encounter: Payer: Self-pay | Admitting: General Practice

## 2013-12-31 ENCOUNTER — Other Ambulatory Visit: Payer: Self-pay | Admitting: Family Medicine

## 2013-12-31 NOTE — Telephone Encounter (Signed)
Last OV 12-04-13 Med filled 11-27-13 #90 with 0

## 2013-12-31 NOTE — Telephone Encounter (Signed)
Med filled and faxed.  

## 2014-01-10 ENCOUNTER — Ambulatory Visit (INDEPENDENT_AMBULATORY_CARE_PROVIDER_SITE_OTHER): Payer: Medicare Other | Admitting: Family Medicine

## 2014-01-10 VITALS — BP 118/60 | HR 109 | Temp 98.9°F | Resp 16 | Wt 214.4 lb

## 2014-01-10 DIAGNOSIS — J45909 Unspecified asthma, uncomplicated: Secondary | ICD-10-CM

## 2014-01-10 DIAGNOSIS — J45901 Unspecified asthma with (acute) exacerbation: Secondary | ICD-10-CM | POA: Diagnosis not present

## 2014-01-10 DIAGNOSIS — R059 Cough, unspecified: Secondary | ICD-10-CM | POA: Diagnosis not present

## 2014-01-10 DIAGNOSIS — J329 Chronic sinusitis, unspecified: Secondary | ICD-10-CM

## 2014-01-10 DIAGNOSIS — R0602 Shortness of breath: Secondary | ICD-10-CM

## 2014-01-10 DIAGNOSIS — R05 Cough: Secondary | ICD-10-CM | POA: Diagnosis not present

## 2014-01-10 DIAGNOSIS — J209 Acute bronchitis, unspecified: Secondary | ICD-10-CM | POA: Diagnosis not present

## 2014-01-10 MED ORDER — AMOXICILLIN 875 MG PO TABS
875.0000 mg | ORAL_TABLET | Freq: Two times a day (BID) | ORAL | Status: DC
Start: 2014-01-10 — End: 2014-07-19

## 2014-01-10 MED ORDER — BENZONATATE 200 MG PO CAPS: 200.0000 mg | ORAL_CAPSULE | Freq: Three times a day (TID) | ORAL | Status: DC | PRN

## 2014-01-10 MED ORDER — ALBUTEROL SULFATE HFA 108 (90 BASE) MCG/ACT IN AERS: 2.0000 | INHALATION_SPRAY | RESPIRATORY_TRACT | Status: DC | PRN

## 2014-01-10 MED ORDER — IPRATROPIUM-ALBUTEROL 0.5-2.5 (3) MG/3ML IN SOLN
3.0000 mL | Freq: Once | RESPIRATORY_TRACT | Status: AC
  Administered 2014-01-10: 3 mL via RESPIRATORY_TRACT

## 2014-01-10 NOTE — Patient Instructions (Signed)
Follow up as needed Start Amoxicillin twice daily for sinusitis/bronchitis Use the Tessalon as needed for cough- add Mucinex DM for cough Use the albuterol as needed for shortness of breath or wheezing Call with any questions or concerns Hang in there!!!

## 2014-01-10 NOTE — Assessment & Plan Note (Signed)
Pt's sxs and PE consistent w/ infxn.  Start abx.  Reviewed supportive care and red flags that should prompt return.  Pt expressed understanding and is in agreement w/ plan.  

## 2014-01-10 NOTE — Assessment & Plan Note (Addendum)
Pt's sxs and PE consistent w/ infection related exacerbation.  Start abx.  Refill albuterol.  Cough meds prn.  Reviewed supportive care and red flags that should prompt return.  Pt expressed understanding and is in agreement w/ plan.

## 2014-01-10 NOTE — Progress Notes (Signed)
Pre visit review using our clinic review tool, if applicable. No additional management support is needed unless otherwise documented below in the visit note. 

## 2014-01-10 NOTE — Progress Notes (Signed)
   Subjective:    Patient ID: Rachael Buck, female    DOB: Nov 02, 1940, 73 y.o.   MRN: [id number]  HPI URI- sxs started 1 week ago w/ cough.  Monday developed asthma sxs w/ chest tightness, SOB, wheezing.  No fevers.  + facial pain- 'behind my eyes'.  No nasal congestion.  No ear pain.  Cough- not productive.  + sick contacts.  Hx of asthma.  Using Albuterol sample but is now completely out.   Review of Systems For ROS see HPI     Objective:   Physical Exam  Vitals reviewed. Constitutional: She appears well-developed and well-nourished. No distress.  HENT:  Head: Normocephalic and atraumatic.  TMs normal bilaterally + TTP over frontal and maxillary sinuses Mild nasal congestion Throat w/out erythema, edema, or exudate  Eyes: Conjunctivae and EOM are normal. Pupils are equal, round, and reactive to light.  Neck: Normal range of motion. Neck supple.  Cardiovascular: Normal rate, regular rhythm, normal heart sounds and intact distal pulses.   No murmur heard. Pulmonary/Chest: Effort normal. No respiratory distress. She has wheezes (diffuse wheezing improved s/p neb tx).  + hacking cough  Lymphadenopathy:    She has no cervical adenopathy.          Assessment & Plan:

## 2014-01-11 ENCOUNTER — Telehealth: Payer: Self-pay | Admitting: Internal Medicine

## 2014-01-15 ENCOUNTER — Telehealth: Payer: Self-pay | Admitting: Family Medicine

## 2014-01-15 NOTE — Telephone Encounter (Signed)
Caller name:Kaja Bevacqua Relation to pt: patient Call back number: [phone] Pharmacy:  Reason for call: patient called and stated that she thinks the amoxacillin is making her sick. She has been vomiting and also has diarrhea since yesterday. Please advise.

## 2014-01-15 NOTE — Telephone Encounter (Signed)
Spoke with pt, she advised she took med with food and no one was sick around her. Pt advised that symptoms have lessened did not take med last night, states that cough is better. Per tabori pt was advised to stop meds and call office if cough worsens.

## 2014-01-15 NOTE — Telephone Encounter (Signed)
Please make sure she is taking this w/ food.  It would be unusual (not impossible) for meds to cause vomiting after 4 days.  There are several GI viruses going around that would be separate from her antibiotics.  Is anyone sick around her?  She can hold the antibiotic today and let us know if vomiting and diarrhea improves

## 2014-01-17 NOTE — Telephone Encounter (Signed)
Pt was declined by Dr. Henrene Pastor and Dr. Fuller Plan.  Patient will come by to pick up records that were brought in.

## 2014-01-23 ENCOUNTER — Encounter: Payer: Self-pay | Admitting: Family Medicine

## 2014-01-23 ENCOUNTER — Telehealth: Payer: Self-pay | Admitting: Family Medicine

## 2014-01-23 NOTE — Telephone Encounter (Signed)
Patient was turned down by [facility] after records from her 2 previous GI doctors were reviewed. Patient would like to know what your recommendation would be. I explained that we can refer her to possible [facility] or Dr. Collene Mares, but we may get the same outcome. Please advise.

## 2014-01-24 NOTE — Telephone Encounter (Signed)
Noted  

## 2014-01-24 NOTE — Telephone Encounter (Signed)
Will attempt to discuss w/ Drs Fuller Plan and Henrene Pastor

## 2014-01-24 NOTE — Telephone Encounter (Signed)
I would like an explanation as to why [facility] is refusing to see this pt.  She is a Financial controller primary care pt and is attempting to switch her care to [facility] rather than [facility] due to the fact that she and her husband are both elderly w/ multiple medical problems and unable to get to appts in [place] w/o considerable difficulty.  She clearly is in need of GI management.  If they feel she is too difficult, I need to know this so I can explain this to them and refer them back to a tertiary care center.

## 2014-01-24 NOTE — Telephone Encounter (Signed)
I spoke w/GI this morning. They state the providers do not give them a reason for being declined, but after they have been declined the decision is final. If you would like, you may call Dr. Henrene Pastor or Dr. Fuller Plan to get more information.

## 2014-01-31 ENCOUNTER — Other Ambulatory Visit: Payer: Self-pay | Admitting: Family Medicine

## 2014-01-31 NOTE — Telephone Encounter (Signed)
Med filled and faxed.  

## 2014-01-31 NOTE — Telephone Encounter (Signed)
Last OV 01-10-14 Med filled 12-31-13 #90 with 0

## 2014-02-04 ENCOUNTER — Telehealth: Payer: Self-pay

## 2014-02-04 NOTE — Telephone Encounter (Signed)
Patient called in to be seen today. Has SOB, wheezing, chest tightness, cough and congestion. Has been using her inhaler but has been under great stress due to her husband having emergency surgery. Patient states that she has been having problems since Friday. Advised that Dr Birdie Riddle does not have any available appts today and strongly encouraged her to be seen at the urgent care or ED. Patient states that she will go to the ED on [address] to be seen.

## 2014-02-08 ENCOUNTER — Telehealth: Payer: Self-pay | Admitting: General Practice

## 2014-02-08 ENCOUNTER — Other Ambulatory Visit: Payer: Self-pay | Admitting: General Practice

## 2014-02-08 MED ORDER — GUAIFENESIN-CODEINE 100-10 MG/5ML PO SYRP: 5.0000 mL | ORAL_SOLUTION | Freq: Three times a day (TID) | ORAL | Status: DC | PRN

## 2014-02-08 NOTE — Telephone Encounter (Signed)
Pt came in to office today with spouse. Wanted to know if we could refill her Cheratussin cough medication, asked pt if she was sure due to codeine allergy. Pt advised she found her old bottle and had been able to take this in the past.   Per tabori ok for medication: 5-71m q6 hours prn. Dispense #247m   Med called in to local pharmacy as requested.

## 2014-02-11 ENCOUNTER — Other Ambulatory Visit: Payer: Self-pay | Admitting: Family Medicine

## 2014-02-11 DIAGNOSIS — K7581 Nonalcoholic steatohepatitis (NASH): Secondary | ICD-10-CM

## 2014-03-08 DIAGNOSIS — K739 Chronic hepatitis, unspecified: Secondary | ICD-10-CM | POA: Diagnosis not present

## 2014-03-08 DIAGNOSIS — Z86010 Personal history of colonic polyps: Secondary | ICD-10-CM | POA: Diagnosis not present

## 2014-03-08 DIAGNOSIS — R932 Abnormal findings on diagnostic imaging of liver and biliary tract: Secondary | ICD-10-CM | POA: Diagnosis not present

## 2014-03-15 ENCOUNTER — Other Ambulatory Visit: Payer: Self-pay | Admitting: Family Medicine

## 2014-03-15 NOTE — Telephone Encounter (Signed)
Med filled.  

## 2014-05-18 DIAGNOSIS — Z23 Encounter for immunization: Secondary | ICD-10-CM | POA: Diagnosis not present

## 2014-06-03 DIAGNOSIS — L720 Epidermal cyst: Secondary | ICD-10-CM | POA: Diagnosis not present

## 2014-06-19 ENCOUNTER — Other Ambulatory Visit: Payer: Self-pay | Admitting: Family Medicine

## 2014-06-19 NOTE — Telephone Encounter (Signed)
Med filled and faxed.

## 2014-06-19 NOTE — Telephone Encounter (Signed)
Last OV 01-10-14 Med filled 01-31-14 #90 with 3

## 2014-07-19 ENCOUNTER — Ambulatory Visit (INDEPENDENT_AMBULATORY_CARE_PROVIDER_SITE_OTHER): Payer: Medicare Other | Admitting: Medical

## 2014-07-19 ENCOUNTER — Encounter: Payer: Self-pay | Admitting: Medical

## 2014-07-19 ENCOUNTER — Ambulatory Visit (HOSPITAL_BASED_OUTPATIENT_CLINIC_OR_DEPARTMENT_OTHER)
Admission: RE | Admit: 2014-07-19 | Discharge: 2014-07-19 | Disposition: A | Discharge status: Home / Self Care | Payer: Medicare Other | Source: Ambulatory Visit | Attending: Medical | Admitting: Medical

## 2014-07-19 VITALS — BP 150/78 | HR 94 | Temp 98.3°F | Ht 64.2 in | Wt 214.6 lb

## 2014-07-19 DIAGNOSIS — M545 Low back pain, unspecified: Secondary | ICD-10-CM

## 2014-07-19 DIAGNOSIS — I700 Atherosclerosis of aorta: Secondary | ICD-10-CM | POA: Diagnosis not present

## 2014-07-19 DIAGNOSIS — M5136 Other intervertebral disc degeneration, lumbar region: Secondary | ICD-10-CM | POA: Insufficient documentation

## 2014-07-19 DIAGNOSIS — R5383 Other fatigue: Secondary | ICD-10-CM

## 2014-07-19 DIAGNOSIS — R1084 Generalized abdominal pain: Secondary | ICD-10-CM | POA: Diagnosis not present

## 2014-07-19 DIAGNOSIS — K7460 Unspecified cirrhosis of liver: Secondary | ICD-10-CM | POA: Diagnosis not present

## 2014-07-19 DIAGNOSIS — M7989 Other specified soft tissue disorders: Secondary | ICD-10-CM | POA: Diagnosis not present

## 2014-07-19 DIAGNOSIS — M25571 Pain in right ankle and joints of right foot: Secondary | ICD-10-CM | POA: Diagnosis not present

## 2014-07-19 DIAGNOSIS — Z9049 Acquired absence of other specified parts of digestive tract: Secondary | ICD-10-CM | POA: Insufficient documentation

## 2014-07-19 LAB — CBC WITH DIFFERENTIAL/PLATELET
Basophils Absolute: 0 10*3/uL (ref 0.0–0.1)
Basophils Relative: 0.4 % (ref 0.0–3.0)
Eosinophils Absolute: 0 10*3/uL (ref 0.0–0.7)
Eosinophils Relative: 0.5 % (ref 0.0–5.0)
HCT: 34.5 % — ABNORMAL LOW (ref 36.0–46.0)
Hemoglobin: 11.2 g/dL — ABNORMAL LOW (ref 12.0–15.0)
Lymphocytes Relative: 30.6 % (ref 12.0–46.0)
Lymphs Abs: 2.1 10*3/uL (ref 0.7–4.0)
MCHC: 32.5 g/dL (ref 30.0–36.0)
MCV: 85.2 fl (ref 78.0–100.0)
Monocytes Absolute: 0.4 10*3/uL (ref 0.1–1.0)
Monocytes Relative: 6.5 % (ref 3.0–12.0)
Neutro Abs: 4.2 10*3/uL (ref 1.4–7.7)
Neutrophils Relative %: 62 % (ref 43.0–77.0)
Platelets: 106 10*3/uL — ABNORMAL LOW (ref 150.0–400.0)
RBC: 4.05 Mil/uL (ref 3.87–5.11)
RDW: 15.8 % — ABNORMAL HIGH (ref 11.5–15.5)
WBC: 6.7 10*3/uL (ref 4.0–10.5)

## 2014-07-19 LAB — POCT URINALYSIS DIPSTICK
Glucose, UA: NEGATIVE
Ketones, UA: NEGATIVE
Nitrite, UA: NEGATIVE
Spec Grav, UA: <=1.005
Urobilinogen, UA: 0.2
pH, UA: 8

## 2014-07-19 LAB — COMPREHENSIVE METABOLIC PANEL
ALT: 26 U/L (ref 0–35)
AST: 56 U/L — ABNORMAL HIGH (ref 0–37)
Albumin: 4 g/dL (ref 3.5–5.2)
Alkaline Phosphatase: 56 U/L (ref 39–117)
BUN: 10 mg/dL (ref 6–23)
CO2: 30 mEq/L (ref 19–32)
Calcium: 9.2 mg/dL (ref 8.4–10.5)
Chloride: 94 mEq/L — ABNORMAL LOW (ref 96–112)
Creatinine, Ser: 0.5 mg/dL (ref 0.4–1.2)
GFR: 131.91 mL/min (ref >60.00)
Glucose, Bld: 129 mg/dL — ABNORMAL HIGH (ref 70–99)
Potassium: 4 mEq/L (ref 3.5–5.1)
Sodium: 133 mEq/L — ABNORMAL LOW (ref 135–145)
Total Bilirubin: 1.2 mg/dL (ref 0.2–1.2)
Total Protein: 7.6 g/dL (ref 6.0–8.3)

## 2014-07-19 MED ORDER — CIPROFLOXACIN HCL 250 MG PO TABS: 250.0000 mg | ORAL_TABLET | Freq: Two times a day (BID) | ORAL | Status: DC

## 2014-07-19 NOTE — Patient Instructions (Signed)
You have had severe fatigue recently and there may be a variety of causes ranging from worsening cirrhosis, low na, low k, anemia or uti.  I am ordering cbc, cmp, hemoccult card, ua, and urine culture.  You look stable presently but this is Friday so if your symptoms worsen or change over the weekend  then ED evaluation.  Your abdominal pain that is transient seems more consistent with IBS rather than acute abdomen conditions. So continue levsin.  For you lower back pain we will get lumbar xray.  For your ankle pain will get ankle xray.  For back and ankle recommend only low dose ibuprofen.  Follow Monday or Tuesday next week. If over weekend worsens then ED evaluation.

## 2014-07-19 NOTE — Progress Notes (Signed)
Subjective:    Patient ID: Rachael Buck, female    DOB: December 15, 1940, 73 y.o.   MRN: [id number]  HPI   Pt states she feels fatigued. She has a lot of abdomen and back pain. History of non alcoholic cirrhosis of the liver. Pt does see a GI doctor. Pt describes her fatigue as severe. Pt takes care of her 70 yo aunt and husband has had 3 strokes. He does pretty well so she does not really have to take care of him. No vomiting, No recent dark stools but states some 2 weeks ago some dark stools. No hx of anemia.  Pt has occasional transient abdomen pain that is cramping. But not constant or severe. She states this feels like the pain like she has taken levsin in the past.(IBS)  Pt has constant mid lower back pain.(In past and some now) Not associated with stomach pain.  Pt states she twisted her rt ankle 10 days ago. Pt states felt sore and did not get evaluated. Now pain for 4 days.  Frequent urination x 3 days.  Past Medical History  Diagnosis Date  . Arthritis   . Asthma   . Diabetes mellitus   . Diverticulitis   . GERD (gastroesophageal reflux disease)   . Hypertension   . Colon polyps   . NASH (nonalcoholic steatohepatitis)   . Hyperlipidemia   . Obesity   . Cirrhosis, nonalcoholic     History   Social History  . Marital Status: Married    Spouse Name: N/A    Number of Children: N/A  . Years of Education: N/A   Occupational History  . Not on file.   Social History Main Topics  . Smoking status: Former Research scientist (life sciences)  . Smokeless tobacco: Not on file  . Alcohol Use: No  . Drug Use: No  . Sexual Activity: Not on file   Other Topics Concern  . Not on file   Social History Narrative    Past Surgical History  Procedure Laterality Date  . Cholecystectomy  1981  . Partial hysterectomy  1972  . Oophorectomy  1973  . Rotator cuff repair      bilateral. 2006, 2008  . Vein surgery      Family History  Problem Relation Age of Onset  . Breast cancer Daughter   .  Breast cancer Maternal Aunt   . Diabetes Brother   . Diabetes Sister   . Stomach cancer Mother   . Cancer Other   . GER disease Other   . Obesity Other     Allergies  Allergen Reactions  . Codeine Nausea And Vomiting  . Shrimp [Shellfish Allergy] Swelling    Current Outpatient Prescriptions on File Prior to Visit  Medication Sig Dispense Refill  . albuterol (PROAIR HFA) 108 (90 BASE) MCG/ACT inhaler Inhale 2 puffs into the lungs every 4 (four) hours as needed for wheezing or shortness of breath. 1 Inhaler 6  . ALPRAZolam (XANAX) 0.5 MG tablet TAKE 1 TABLET BY MOUTH THREE TIMES DAILY AS NEEDED FOR SLEEP OR ANXIETY 90 tablet 0  . benzonatate (TESSALON) 200 MG capsule Take 1 capsule (200 mg total) by mouth 3 (three) times daily as needed for cough. 60 capsule 0  . glucose blood (ONE TOUCH ULTRA TEST) test strip Use as instructed 100 each 12  . guaiFENesin-codeine (CHERATUSSIN AC) 100-10 MG/5ML syrup Take 5 mLs by mouth 3 (three) times daily as needed for cough. 240 mL 0  . hydrochlorothiazide (HYDRODIURIL) 25  MG tablet TAKE 1 TABLET EVERY DAY 90 tablet 3  . hydrocortisone 2.5 % cream Apply 1 application topically 2 (two) times daily as needed (for eczema).    . hyoscyamine (LEVSIN, ANASPAZ) 0.125 MG tablet Take 0.125 mg by mouth every 4 (four) hours as needed for cramping.    . Loperamide HCl (IMODIUM PO) Take 3 tablets by mouth as needed.     Marland Kitchen losartan (COZAAR) 50 MG tablet TAKE 1 TABLET EVERY DAY 90 tablet 3  . metFORMIN (GLUCOPHAGE) 500 MG tablet TAKE 2 TABLETS TWICE DAILY  WITH  A  MEAL 360 tablet 3  . omeprazole (PRILOSEC) 40 MG capsule TAKE 1 CAPSULE EVERY DAY 90 capsule 3  . ONETOUCH DELICA LANCETS FINE MISC 1 Stick by Does not apply route 2 (two) times daily. 100 each 3  . sertraline (ZOLOFT) 50 MG tablet TAKE 1 TABLET EVERY DAY 90 tablet 1  . simvastatin (ZOCOR) 80 MG tablet TAKE 1 TABLET EVERY DAY 90 tablet 3   No current facility-administered medications on file prior to  visit.    BP 150/78 mmHg  Pulse 94  Temp(Src) 98.3 F (36.8 C) (Oral)  Ht 5' 4.2" (1.631 m)  Wt 214 lb 9.6 oz (97.342 kg)  BMI 36.59 kg/m2  SpO2 94%         Review of Systems  Constitutional: Positive for fatigue. Negative for fever and chills.  HENT: Negative for congestion, ear discharge, ear pain, nosebleeds, postnasal drip, rhinorrhea, sinus pressure, sore throat and trouble swallowing.   Respiratory: Negative for cough, chest tightness, shortness of breath and wheezing.   Cardiovascular: Negative for chest pain and palpitations.  Gastrointestinal: Positive for abdominal pain. Negative for nausea, vomiting, diarrhea, constipation and blood in stool.       Dark stools 2 wks ago but none since.  Genitourinary: Positive for frequency. Negative for dysuria, urgency, flank pain, decreased urine volume and vaginal pain.  Musculoskeletal: Positive for back pain.       Rt ankle pain.  Neurological: Negative for dizziness, tremors, seizures, syncope, weakness, light-headedness, numbness and headaches.  Hematological: Negative for adenopathy. Does not bruise/bleed easily.  Psychiatric/Behavioral: Negative for suicidal ideas, behavioral problems and dysphoric mood. The patient is not nervous/anxious.        Objective:   Physical Exam   General Appearance- Not in acute distress.  HEENT Eyes- Scleraeral/Conjuntiva-bilat- Not Yellow. Mouth & Throat- Normal.  Chest and Lung Exam Auscultation: Breath sounds:-Normal. Adventitious sounds:- No Adventitious sounds.  Cardiovascular Auscultation:Rythm - Regular. Heart Sounds -Normal heart sounds.  Abdomen Inspection:-Inspection Normal.  Palpation/Perucssion: Palpation and Percussion of the abdomen reveal- Non Tender, No Rebound tenderness. No obvious ascites and no obvious hepatomegaly on palpation. Spleen:- Normal.   Back- mild mid lumbar spine tenderness.   Rt ankle- mild swollen and faint tender talo-fibular  ligament.        Assessment & Plan:

## 2014-07-19 NOTE — Progress Notes (Signed)
Pre visit review using our clinic review tool, if applicable. No additional management support is needed unless otherwise documented below in the visit note. 

## 2014-07-19 NOTE — Assessment & Plan Note (Signed)
Pt  had severe fatigue recently and there may be a variety of causes ranging from worsening cirrhosis, low na, low k, anemia or uti.  I am ordering cbc, cmp, hemoccult card, ua, and urine culture.

## 2014-07-19 NOTE — Assessment & Plan Note (Signed)
Your abdominal pain that is transient seems more consistent with IBS rather than acute abdomen conditions. So continue levsin. Lab work up for fatigue may show cause of abdomen pain as well.

## 2014-07-21 LAB — URINE CULTURE: Colony Count: 30000

## 2014-07-23 ENCOUNTER — Other Ambulatory Visit: Payer: Self-pay | Admitting: Family Medicine

## 2014-07-23 NOTE — Telephone Encounter (Signed)
Last OV 01-10-14 Alprazolam last filled 06/19/14 #90 with 0

## 2014-07-24 ENCOUNTER — Encounter: Payer: Self-pay | Admitting: Family Medicine

## 2014-07-24 ENCOUNTER — Ambulatory Visit (INDEPENDENT_AMBULATORY_CARE_PROVIDER_SITE_OTHER): Payer: Medicare Other | Admitting: Family Medicine

## 2014-07-24 VITALS — BP 138/80 | HR 96 | Temp 98.1°F | Resp 16 | Wt 213.5 lb

## 2014-07-24 DIAGNOSIS — E782 Mixed hyperlipidemia: Secondary | ICD-10-CM | POA: Diagnosis not present

## 2014-07-24 DIAGNOSIS — R5383 Other fatigue: Secondary | ICD-10-CM

## 2014-07-24 DIAGNOSIS — R1010 Upper abdominal pain, unspecified: Secondary | ICD-10-CM | POA: Diagnosis not present

## 2014-07-24 DIAGNOSIS — E1165 Type 2 diabetes mellitus with hyperglycemia: Secondary | ICD-10-CM

## 2014-07-24 DIAGNOSIS — E119 Type 2 diabetes mellitus without complications: Secondary | ICD-10-CM | POA: Diagnosis not present

## 2014-07-24 DIAGNOSIS — IMO0002 Reserved for concepts with insufficient information to code with codable children: Secondary | ICD-10-CM

## 2014-07-24 LAB — HEMOGLOBIN A1C
Hgb A1c MFr Bld: 7.2 % — ABNORMAL HIGH (ref <5.7)
Mean Plasma Glucose: 160 mg/dL — ABNORMAL HIGH (ref <117)

## 2014-07-24 LAB — BASIC METABOLIC PANEL
BUN: 10 mg/dL (ref 6–23)
CO2: 28 mEq/L (ref 19–32)
Calcium: 9.1 mg/dL (ref 8.4–10.5)
Chloride: 94 mEq/L — ABNORMAL LOW (ref 96–112)
Creat: 0.56 mg/dL (ref 0.50–1.10)
Glucose, Bld: 100 mg/dL — ABNORMAL HIGH (ref 70–99)
Potassium: 3.5 mEq/L (ref 3.5–5.3)
Sodium: 135 mEq/L (ref 135–145)

## 2014-07-24 LAB — LIPID PANEL
Cholesterol: 103 mg/dL (ref 0–200)
HDL: 44 mg/dL (ref >39)
LDL Cholesterol: 25 mg/dL (ref 0–99)
Total CHOL/HDL Ratio: 2.3 Ratio
Triglycerides: 169 mg/dL — ABNORMAL HIGH (ref <150)
VLDL: 34 mg/dL (ref 0–40)

## 2014-07-24 LAB — HEPATIC FUNCTION PANEL
ALT: 21 U/L (ref 0–35)
AST: 34 U/L (ref 0–37)
Albumin: 4 g/dL (ref 3.5–5.2)
Alkaline Phosphatase: 53 U/L (ref 39–117)
Bilirubin, Direct: 0.2 mg/dL (ref 0.0–0.3)
Indirect Bilirubin: 0.7 mg/dL (ref 0.2–1.2)
Total Bilirubin: 0.9 mg/dL (ref 0.2–1.2)
Total Protein: 7.4 g/dL (ref 6.0–8.3)

## 2014-07-24 LAB — TSH: TSH: 2.074 u[IU]/mL (ref 0.350–4.500)

## 2014-07-24 NOTE — Telephone Encounter (Signed)
Med filled and faxed.

## 2014-07-24 NOTE — Patient Instructions (Signed)
Schedule your complete physical in 3-4 months We'll notify you of your lab results and make any changes if needed REST!  I think your fatigue is b/c you're overdoing it! Continue to take the stomach spasm pills as needed Call with any questions or concerns Happy Holidays!!!

## 2014-07-24 NOTE — Progress Notes (Signed)
   Subjective:    Patient ID: Rachael Buck, female    DOB: [birth year], 73 y.o.   MRN: [id number]  HPI F/u fall- 'i've just been so tired for 3-4 weeks'.  R ankle sprain, still swollen.  Still some pain.  Normal Xray.  + abdominal pain- 'all the way across'.  Reports pain is abdominal cramping, relieved w/ Hyoscyamine TID.  DM- chronic problem, due for labs.  Pt has not been compliant w/ f/u's.  On Metformin.  Denies symptomatic lows.  UTD on eye exam.  On ARB for renal protection.  Chronic abd pain.  Denies CP, some SOB- not more than baseline.  HTN- chronic problem, on Losartan and HCTZ.  Denies CP, SOB above baseline, HAs, visual changes, edema.  Hyperlipidemia- chronic problem, on Simvastatin.  Chronic abd pain, mild nausea.  No vomiting.     Review of Systems For ROS see HPI     Objective:   Physical Exam  Constitutional: She is oriented to person, place, and time. She appears well-developed and well-nourished. No distress.  obese  HENT:  Head: Normocephalic and atraumatic.  Eyes: Conjunctivae and EOM are normal. Pupils are equal, round, and reactive to light.  Neck: Normal range of motion. Neck supple. No thyromegaly present.  Cardiovascular: Normal rate, regular rhythm, normal heart sounds and intact distal pulses.   No murmur heard. Pulmonary/Chest: Effort normal and breath sounds normal. No respiratory distress.  Abdominal: Soft. She exhibits distension (diffuse abd distenstion w/ hepatomegaly). There is no tenderness. There is no rebound and no guarding.  Musculoskeletal: She exhibits no edema.  Lymphadenopathy:    She has no cervical adenopathy.  Neurological: She is alert and oriented to person, place, and time.  Skin: Skin is warm and dry.  Psychiatric: She has a normal mood and affect. Her behavior is normal.  Vitals reviewed.         Assessment & Plan:

## 2014-07-24 NOTE — Progress Notes (Signed)
Pre visit review using our clinic review tool, if applicable. No additional management support is needed unless otherwise documented below in the visit note. 

## 2014-07-26 ENCOUNTER — Other Ambulatory Visit: Payer: Self-pay | Admitting: General Practice

## 2014-07-26 MED ORDER — METFORMIN HCL 1000 MG PO TABS: 1000.0000 mg | ORAL_TABLET | Freq: Two times a day (BID) | ORAL | Status: DC

## 2014-07-28 NOTE — Assessment & Plan Note (Signed)
Chronic problem.  Pt has not been compliant w/ f/u- stressed need for this.  UTD on eye exam.  On ARB for renal protection.  Check labs.  Adjust meds prn

## 2014-07-28 NOTE — Assessment & Plan Note (Signed)
Chronic problem.  Tolerating statin w/o difficulty.  Check labs.  Adjust meds prn  

## 2014-07-28 NOTE — Assessment & Plan Note (Signed)
Recurrent problem for pt.  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  Check labs to r/o underlying metabolic cause.  Will follow.

## 2014-07-28 NOTE — Assessment & Plan Note (Signed)
Chronic problem.  Pt w/ known NASH.  Pain improves w/ hyoscyamine.  Encouraged her to continue med prn.  Check labs.  No changes at this time.

## 2014-07-30 LAB — HM MAMMOGRAPHY: HM Mammogram: NORMAL

## 2014-08-05 ENCOUNTER — Telehealth: Payer: Self-pay | Admitting: Family Medicine

## 2014-08-05 NOTE — Telephone Encounter (Signed)
Caller name:Barg, Auset Relation to HE:RDEY Call back number:[phone] Pharmacy:  Reason for call: pt states she never received her labs that she did on 07/29/14

## 2014-08-05 NOTE — Telephone Encounter (Signed)
Pt notified of results

## 2014-09-09 DIAGNOSIS — R932 Abnormal findings on diagnostic imaging of liver and biliary tract: Secondary | ICD-10-CM | POA: Diagnosis not present

## 2014-09-09 DIAGNOSIS — R109 Unspecified abdominal pain: Secondary | ICD-10-CM | POA: Diagnosis not present

## 2014-09-09 DIAGNOSIS — A09 Infectious gastroenteritis and colitis, unspecified: Secondary | ICD-10-CM | POA: Diagnosis not present

## 2014-09-09 DIAGNOSIS — K7581 Nonalcoholic steatohepatitis (NASH): Secondary | ICD-10-CM | POA: Diagnosis not present

## 2014-09-23 ENCOUNTER — Other Ambulatory Visit: Payer: Self-pay | Admitting: Family Medicine

## 2014-09-23 NOTE — Telephone Encounter (Signed)
Med filled.  

## 2014-09-26 ENCOUNTER — Other Ambulatory Visit: Payer: Self-pay | Admitting: Family Medicine

## 2014-09-26 NOTE — Telephone Encounter (Signed)
Med filled and faxed.

## 2014-09-26 NOTE — Telephone Encounter (Signed)
Last OV 07-24-14 Alprazolam last filled 07-24-14 #90 with 1

## 2014-10-16 ENCOUNTER — Encounter: Payer: Self-pay | Admitting: Medical

## 2014-10-16 ENCOUNTER — Ambulatory Visit (INDEPENDENT_AMBULATORY_CARE_PROVIDER_SITE_OTHER): Payer: Medicare Other | Admitting: Medical

## 2014-10-16 VITALS — BP 136/87 | HR 92 | Temp 98.3°F | Ht 64.2 in | Wt 215.4 lb

## 2014-10-16 DIAGNOSIS — J029 Acute pharyngitis, unspecified: Secondary | ICD-10-CM | POA: Insufficient documentation

## 2014-10-16 LAB — POCT RAPID STREP A (OFFICE): Rapid Strep A Screen: POSITIVE — AB

## 2014-10-16 MED ORDER — TOBRAMYCIN 0.3 % OP SOLN: 1.0000 [drp] | Freq: Four times a day (QID) | OPHTHALMIC | Status: DC

## 2014-10-16 MED ORDER — AZITHROMYCIN 250 MG PO TABS: ORAL_TABLET | ORAL | Status: DC

## 2014-10-16 MED ORDER — FLUTICASONE PROPIONATE 50 MCG/ACT NA SUSP: 2.0000 | Freq: Every day | NASAL | Status: DC

## 2014-10-16 NOTE — Assessment & Plan Note (Addendum)
Your strep test was positive. I am prescribing azithromycin  antibiotic. Rest hydrate, tylenol for fever and warm salt water gargles. Follow up in 7 days or as needed.   For your recent ear pressure, I will rx flonase nasal spray.  For your recent eye matting in the am will rx tobrex.Marland Kitchen

## 2014-10-16 NOTE — Progress Notes (Signed)
Subjective:    Patient ID: Rachael Buck, female    DOB: [birth year], 74 y.o.   MRN: [id number]  HPI   Pt in with sore throat for one week. She has swollen lymph node on rt side. Pt states hurts to swallow her own spit.  Some chilly feeling. No fever but she states hardly ever runs fevers. Pt has grandchildren. Recent visited one of younger ones.  Some ear pressure and pain. Some matting to eye in am. Lids stuck. For one week but denies nasal congestion.Some occasional sneezing.    Review of Systems  Constitutional: Positive for chills. Negative for fever and fatigue.  HENT: Positive for sore throat. Negative for congestion, drooling, facial swelling, nosebleeds, postnasal drip, rhinorrhea, sinus pressure, sneezing and trouble swallowing.        Rt ear pressure/faint pain.  Eyes: Positive for discharge.       In am recenlty but none now.  Respiratory: Negative for cough, choking, chest tightness, shortness of breath and wheezing.   Cardiovascular: Negative for chest pain and palpitations.  Endocrine: Negative for polydipsia, polyphagia and polyuria.  Musculoskeletal: Negative for back pain.  Neurological: Negative for dizziness, seizures, syncope, facial asymmetry, speech difficulty, weakness, light-headedness, numbness and headaches.  Hematological: Positive for adenopathy. Does not bruise/bleed easily.   Past Medical History  Diagnosis Date  . Arthritis   . Asthma   . Diabetes mellitus   . Diverticulitis   . GERD (gastroesophageal reflux disease)   . Hypertension   . Colon polyps   . NASH (nonalcoholic steatohepatitis)   . Hyperlipidemia   . Obesity   . Cirrhosis, nonalcoholic     History   Social History  . Marital Status: Married    Spouse Name: N/A  . Number of Children: N/A  . Years of Education: N/A   Occupational History  . Not on file.   Social History Main Topics  . Smoking status: Former Research scientist (life sciences)  . Smokeless tobacco: Not on file  . Alcohol Use: No   . Drug Use: No  . Sexual Activity: Not on file   Other Topics Concern  . Not on file   Social History Narrative    Past Surgical History  Procedure Laterality Date  . Cholecystectomy  1981  . Partial hysterectomy  1972  . Oophorectomy  1973  . Rotator cuff repair      bilateral. 2006, 2008  . Vein surgery      Family History  Problem Relation Age of Onset  . Breast cancer Daughter   . Breast cancer Maternal Aunt   . Diabetes Brother   . Diabetes Sister   . Stomach cancer Mother   . Cancer Other   . GER disease Other   . Obesity Other     Allergies  Allergen Reactions  . Codeine Nausea And Vomiting  . Shrimp [Shellfish Allergy] Swelling    Current Outpatient Prescriptions on File Prior to Visit  Medication Sig Dispense Refill  . albuterol (PROAIR HFA) 108 (90 BASE) MCG/ACT inhaler Inhale 2 puffs into the lungs every 4 (four) hours as needed for wheezing or shortness of breath. 1 Inhaler 6  . ALPRAZolam (XANAX) 0.5 MG tablet TAKE 1 TABLET BY MOUTH THREE TIMES DAILY AS NEEDED FOR SLEEP OR ANXIETY 90 tablet 0  . glucose blood (ONE TOUCH ULTRA TEST) test strip Use as instructed 100 each 12  . hydrochlorothiazide (HYDRODIURIL) 25 MG tablet TAKE 1 TABLET EVERY DAY 90 tablet 1  . hydrocortisone 2.5 %  cream Apply 1 application topically 2 (two) times daily as needed (for eczema).    . hyoscyamine (LEVSIN, ANASPAZ) 0.125 MG tablet Take 0.125 mg by mouth every 4 (four) hours as needed for cramping.    . Loperamide HCl (IMODIUM PO) Take 3 tablets by mouth as needed.     Marland Kitchen losartan (COZAAR) 50 MG tablet TAKE 1 TABLET EVERY DAY 90 tablet 1  . metFORMIN (GLUCOPHAGE) 1000 MG tablet Take 1 tablet (1,000 mg total) by mouth 2 (two) times daily with a meal. 180 tablet 3  . omeprazole (PRILOSEC) 40 MG capsule TAKE 1 CAPSULE EVERY DAY 90 capsule 1  . ONETOUCH DELICA LANCETS FINE MISC 1 Stick by Does not apply route 2 (two) times daily. 100 each 3  . sertraline (ZOLOFT) 50 MG tablet TAKE  1 TABLET EVERY DAY 90 tablet 1  . simvastatin (ZOCOR) 80 MG tablet TAKE 1 TABLET EVERY DAY 90 tablet 1   No current facility-administered medications on file prior to visit.    BP 136/87 mmHg  Pulse 92  Temp(Src) 98.3 F (36.8 C) (Oral)  Ht 5' 4.2" (1.631 m)  Wt 215 lb 6.4 oz (97.705 kg)  BMI 36.73 kg/m2  SpO2 96%       Objective:   Physical Exam   General  Mental Status - Alert. General Appearance - Well groomed. Not in acute distress.  Skin Rashes- No Rashes.  HEENT Head- Normal. Ear Auditory Canal - Left- Normal. Right - Normal.Tympanic Membrane- Left- Normal. And small wax present. Right- Large wax can't view tim Eye Sclera/Conjunctiva- Left- Normal. Right- Normal. No dc presently. Eyes not red or injected. Nose & Sinuses Nasal Mucosa- Left-  Boggy and Congested. Right-  Boggy and  Congested.No bilateral maxillary and no  frontal sinus pressure. Mouth & Throat Lips: Upper Lip- Normal: no dryness, cracking, pallor, cyanosis, or vesicular eruption. Lower Lip-Normal: no dryness, cracking, pallor, cyanosis or vesicular eruption. Buccal Mucosa- Bilateral- No Aphthous ulcers. Oropharynx- No Discharge or Erythema. Tonsils: Characteristics- Bilateral- mild  Erythema and mild Congestion. Size/Enlargement- Bilateral- mild enlargement. Discharge- bilateral-None.  Neck Neck- Supple. No Masses.rt side sub mandibular node enlarged.   Chest and Lung Exam Auscultation: Breath Sounds:-Clear even and unlabored.  Cardiovascular Auscultation:Rythm- Regular, rate and rhythm. Murmurs & Other Heart Sounds:Ausculatation of the heart reveal- No Murmurs.  Lymphatic Head & Neck General Head & Neck Lymphatics: Bilateral: Description- rt side submandibular node swollen.            Assessment & Plan:

## 2014-10-16 NOTE — Progress Notes (Signed)
Pre visit review using our clinic review tool, if applicable. No additional management support is needed unless otherwise documented below in the visit note. 

## 2014-10-16 NOTE — Patient Instructions (Addendum)
Acute pharyngitis Your strep test was positive. I am prescribing azithromycin  antibiotic. Rest hydrate, tylenol for fever and warm salt water gargles. Follow up in 7 days or as needed.   For your recent ear pressure, I will rx flonase nasal spray.  For your recent eye matting in the am will rx tobrex..      Note with rt side lymph node enlarged woud not recommend ear lavage today. You could get debrox otc to soften wax and then we could lavage wax out later.

## 2014-10-31 ENCOUNTER — Other Ambulatory Visit: Payer: Self-pay | Admitting: Family Medicine

## 2014-10-31 NOTE — Telephone Encounter (Signed)
Med filled and faxed.  

## 2014-10-31 NOTE — Telephone Encounter (Signed)
Last OV 07/24/14 Alprazolam last filled 09/26/14 #90 with 0

## 2014-11-20 ENCOUNTER — Encounter: Payer: Medicare Other | Admitting: Family Medicine

## 2014-12-06 ENCOUNTER — Other Ambulatory Visit: Payer: Self-pay | Admitting: Family Medicine

## 2014-12-06 DIAGNOSIS — Z86010 Personal history of colonic polyps: Secondary | ICD-10-CM | POA: Diagnosis not present

## 2014-12-06 DIAGNOSIS — K7581 Nonalcoholic steatohepatitis (NASH): Secondary | ICD-10-CM | POA: Diagnosis not present

## 2014-12-06 DIAGNOSIS — R109 Unspecified abdominal pain: Secondary | ICD-10-CM | POA: Diagnosis not present

## 2014-12-06 DIAGNOSIS — R932 Abnormal findings on diagnostic imaging of liver and biliary tract: Secondary | ICD-10-CM | POA: Diagnosis not present

## 2014-12-06 DIAGNOSIS — A09 Infectious gastroenteritis and colitis, unspecified: Secondary | ICD-10-CM | POA: Diagnosis not present

## 2014-12-06 NOTE — Telephone Encounter (Signed)
Med filled and faxed.

## 2014-12-06 NOTE — Telephone Encounter (Signed)
Last OV 07-04-14 Alprazolam last filed 10-31-14 #90 with 0

## 2015-01-03 ENCOUNTER — Encounter: Payer: Self-pay | Admitting: Family Medicine

## 2015-01-03 ENCOUNTER — Ambulatory Visit (INDEPENDENT_AMBULATORY_CARE_PROVIDER_SITE_OTHER): Payer: Medicare Other | Admitting: Family Medicine

## 2015-01-03 VITALS — BP 130/82 | HR 101 | Temp 97.9°F | Resp 17 | Wt 216.4 lb

## 2015-01-03 DIAGNOSIS — J0100 Acute maxillary sinusitis, unspecified: Secondary | ICD-10-CM | POA: Diagnosis not present

## 2015-01-03 MED ORDER — AMOXICILLIN 875 MG PO TABS: 875.0000 mg | ORAL_TABLET | Freq: Two times a day (BID) | ORAL | Status: DC

## 2015-01-03 NOTE — Progress Notes (Signed)
   Subjective:    Patient ID: Rachael Buck, female    DOB: [birth year], 74 y.o.   MRN: [id number]  HPI URI- sxs started on Tuesday.  'i got a head full of crap'.  Taking cough syrup w/o relief.  Using albuterol inhaler due to SOB.  L maxillary facial pain.  No tooth pain.  L ear pain.  No fevers.  + nasal congestion, PND.  No N/V.  No known sick contacts.   Review of Systems For ROS see HPI     Objective:   Physical Exam  Constitutional: She appears well-developed and well-nourished. No distress.  HENT:  Head: Normocephalic and atraumatic.  Right Ear: Tympanic membrane normal.  Left Ear: Tympanic membrane normal.  Nose: Mucosal edema and rhinorrhea present. Right sinus exhibits no maxillary sinus tenderness and no frontal sinus tenderness. Left sinus exhibits maxillary sinus tenderness and frontal sinus tenderness.  Mouth/Throat: Uvula is midline and mucous membranes are normal. Posterior oropharyngeal erythema present. No oropharyngeal exudate.  Eyes: Conjunctivae and EOM are normal. Pupils are equal, round, and reactive to light.  Neck: Normal range of motion. Neck supple.  Cardiovascular: Normal rate, regular rhythm and normal heart sounds.   Pulmonary/Chest: Effort normal and breath sounds normal. No respiratory distress. She has no wheezes.  Lymphadenopathy:    She has no cervical adenopathy.  Vitals reviewed.         Assessment & Plan:

## 2015-01-03 NOTE — Progress Notes (Signed)
Pre visit review using our clinic review tool, if applicable. No additional management support is needed unless otherwise documented below in the visit note. 

## 2015-01-03 NOTE — Patient Instructions (Signed)
Follow up as scheduled to recheck the diabetes Start the Amoxicillin for the sinus infection Drink plenty of fluids REST! Call with any questions or concerns Hang in there! Happy Mother's Day!!!

## 2015-01-05 NOTE — Assessment & Plan Note (Signed)
Pt's sxs and PE consistent w/ infxn.  Start abx.  Reviewed supportive care and red flags that should prompt return.  Pt expressed understanding and is in agreement w/ plan.  

## 2015-01-09 ENCOUNTER — Encounter: Payer: Self-pay | Admitting: Family Medicine

## 2015-01-09 ENCOUNTER — Ambulatory Visit (INDEPENDENT_AMBULATORY_CARE_PROVIDER_SITE_OTHER): Payer: Medicare Other | Admitting: Family Medicine

## 2015-01-09 VITALS — BP 128/68 | HR 108 | Temp 98.2°F | Resp 16

## 2015-01-09 DIAGNOSIS — J45909 Unspecified asthma, uncomplicated: Secondary | ICD-10-CM

## 2015-01-09 DIAGNOSIS — J209 Acute bronchitis, unspecified: Secondary | ICD-10-CM

## 2015-01-09 DIAGNOSIS — J45901 Unspecified asthma with (acute) exacerbation: Secondary | ICD-10-CM

## 2015-01-09 MED ORDER — PREDNISONE 10 MG PO TABS: ORAL_TABLET | ORAL | Status: DC

## 2015-01-09 MED ORDER — ALPRAZOLAM 0.5 MG PO TABS: ORAL_TABLET | ORAL | Status: DC

## 2015-01-09 MED ORDER — ALBUTEROL SULFATE HFA 108 (90 BASE) MCG/ACT IN AERS: 2.0000 | INHALATION_SPRAY | RESPIRATORY_TRACT | Status: DC | PRN

## 2015-01-09 MED ORDER — IPRATROPIUM-ALBUTEROL 0.5-2.5 (3) MG/3ML IN SOLN
3.0000 mL | Freq: Once | RESPIRATORY_TRACT | Status: AC
  Administered 2015-01-09: 3 mL via RESPIRATORY_TRACT

## 2015-01-09 NOTE — Progress Notes (Signed)
Pre visit review using our clinic review tool, if applicable. No additional management support is needed unless otherwise documented below in the visit note. 

## 2015-01-09 NOTE — Progress Notes (Signed)
   Subjective:    Patient ID: Rachael Buck, female    DOB: Dec 11, 1940, 74 y.o.   MRN: [id number]  HPI Asthma exacerbation- pt was seen on 5/6 and started on Amoxicillin for sinus infxn.  Later that night, pt started coughing.  sxs have persisted for the last week and her O2 level this AM was 88-92%.  Cough is productive.   Review of Systems For ROS see HPI     Objective:   Physical Exam  Constitutional: She is oriented to person, place, and time. She appears well-developed and well-nourished.  Obviously uncomfortable  HENT:  Head: Normocephalic and atraumatic.  Cardiovascular:  Tachy but regular  Pulmonary/Chest: She is in respiratory distress (mild increased WOB w/ speaking- improved s/p duoneb). She has wheezes (diffuse, inspiratory and expiratory wheezes- improved s/p duoneb). She has no rales.  Neurological: She is alert and oriented to person, place, and time. No cranial nerve deficit. Coordination normal.  Skin: Skin is warm and dry.  Psychiatric: She has a normal mood and affect. Her behavior is normal. Thought content normal.  Vitals reviewed.         Assessment & Plan:

## 2015-01-09 NOTE — Patient Instructions (Signed)
Follow up as scheduled Continue the Amoxicillin twice daily Use the albuterol inhaler as needed for shortness of breath Start the Prednisone as directed- take w/ food- to improve your breathing Please watch your carb intake as the prednisone will increase your sugars If increased shortness of breath or other concern- please call or go to the ER Call with any questions or concerns Hang in there!!!

## 2015-01-09 NOTE — Assessment & Plan Note (Signed)
Deteriorated.  Pt w/ increased wheezing and SOB.  Air movement improved s/p neb tx.  O2 sats improved to 94%.  No longer having difficulty speaking in full sentences.  Will start prednisone- cautioned about elevated sugars in setting of her diabetes.  Reviewed supportive care and red flags that should prompt return.  Pt expressed understanding and is in agreement w/ plan.

## 2015-02-10 ENCOUNTER — Encounter: Payer: Self-pay | Admitting: *Deleted

## 2015-02-10 ENCOUNTER — Telehealth: Payer: Self-pay | Admitting: *Deleted

## 2015-02-10 NOTE — Telephone Encounter (Signed)
Pre-Visit Call completed with patient and chart updated.   Pre-Visit Info documented in Specialty Comments under [employer].    

## 2015-02-11 ENCOUNTER — Encounter: Payer: Self-pay | Admitting: Family Medicine

## 2015-02-11 ENCOUNTER — Ambulatory Visit (INDEPENDENT_AMBULATORY_CARE_PROVIDER_SITE_OTHER): Payer: Medicare Other | Admitting: Family Medicine

## 2015-02-11 VITALS — BP 130/80 | HR 96 | Temp 97.9°F | Resp 16 | Ht 65.0 in | Wt 217.5 lb

## 2015-02-11 DIAGNOSIS — Z23 Encounter for immunization: Secondary | ICD-10-CM | POA: Diagnosis not present

## 2015-02-11 DIAGNOSIS — E782 Mixed hyperlipidemia: Secondary | ICD-10-CM

## 2015-02-11 DIAGNOSIS — E1165 Type 2 diabetes mellitus with hyperglycemia: Secondary | ICD-10-CM | POA: Diagnosis not present

## 2015-02-11 DIAGNOSIS — Z0000 Encounter for general adult medical examination without abnormal findings: Secondary | ICD-10-CM | POA: Diagnosis not present

## 2015-02-11 DIAGNOSIS — I8500 Esophageal varices without bleeding: Secondary | ICD-10-CM

## 2015-02-11 DIAGNOSIS — IMO0002 Reserved for concepts with insufficient information to code with codable children: Secondary | ICD-10-CM

## 2015-02-11 DIAGNOSIS — I10 Essential (primary) hypertension: Secondary | ICD-10-CM

## 2015-02-11 DIAGNOSIS — K7581 Nonalcoholic steatohepatitis (NASH): Secondary | ICD-10-CM

## 2015-02-11 LAB — LIPID PANEL
Cholesterol: 127 mg/dL (ref 0–200)
HDL: 49.5 mg/dL (ref >39.00)
LDL Cholesterol: 45 mg/dL (ref 0–99)
NonHDL: 77.5
Total CHOL/HDL Ratio: 3
Triglycerides: 165 mg/dL — ABNORMAL HIGH (ref 0.0–149.0)
VLDL: 33 mg/dL (ref 0.0–40.0)

## 2015-02-11 LAB — HEPATIC FUNCTION PANEL
ALT: 19 U/L (ref 0–35)
AST: 30 U/L (ref 0–37)
Albumin: 4.1 g/dL (ref 3.5–5.2)
Alkaline Phosphatase: 54 U/L (ref 39–117)
Bilirubin, Direct: 0.2 mg/dL (ref 0.0–0.3)
Total Bilirubin: 0.8 mg/dL (ref 0.2–1.2)
Total Protein: 7.4 g/dL (ref 6.0–8.3)

## 2015-02-11 LAB — CBC WITH DIFFERENTIAL/PLATELET
Basophils Absolute: 0 10*3/uL (ref 0.0–0.1)
Basophils Relative: 0.4 % (ref 0.0–3.0)
Eosinophils Absolute: 0 10*3/uL (ref 0.0–0.7)
Eosinophils Relative: 0.6 % (ref 0.0–5.0)
HCT: 33.7 % — ABNORMAL LOW (ref 36.0–46.0)
Hemoglobin: 11 g/dL — ABNORMAL LOW (ref 12.0–15.0)
Lymphocytes Relative: 27.4 % (ref 12.0–46.0)
Lymphs Abs: 1.7 10*3/uL (ref 0.7–4.0)
MCHC: 32.7 g/dL (ref 30.0–36.0)
MCV: 81.7 fl (ref 78.0–100.0)
Monocytes Absolute: 0.4 10*3/uL (ref 0.1–1.0)
Monocytes Relative: 6.6 % (ref 3.0–12.0)
Neutro Abs: 4 10*3/uL (ref 1.4–7.7)
Neutrophils Relative %: 65 % (ref 43.0–77.0)
Platelets: 118 10*3/uL — ABNORMAL LOW (ref 150.0–400.0)
RBC: 4.12 Mil/uL (ref 3.87–5.11)
RDW: 16.2 % — ABNORMAL HIGH (ref 11.5–15.5)
WBC: 6.2 10*3/uL (ref 4.0–10.5)

## 2015-02-11 LAB — BASIC METABOLIC PANEL
BUN: 8 mg/dL (ref 6–23)
CO2: 34 mEq/L — ABNORMAL HIGH (ref 19–32)
Calcium: 9.4 mg/dL (ref 8.4–10.5)
Chloride: 95 mEq/L — ABNORMAL LOW (ref 96–112)
Creatinine, Ser: 0.53 mg/dL (ref 0.40–1.20)
GFR: 120.3 mL/min (ref >60.00)
Glucose, Bld: 141 mg/dL — ABNORMAL HIGH (ref 70–99)
Potassium: 3.6 mEq/L (ref 3.5–5.1)
Sodium: 135 mEq/L (ref 135–145)

## 2015-02-11 LAB — TSH: TSH: 2.17 u[IU]/mL (ref 0.35–4.50)

## 2015-02-11 LAB — HEMOGLOBIN A1C: Hgb A1c MFr Bld: 7.4 % — ABNORMAL HIGH (ref 4.6–6.5)

## 2015-02-11 MED ORDER — LOSARTAN POTASSIUM 50 MG PO TABS: 50.0000 mg | ORAL_TABLET | Freq: Every day | ORAL | Status: DC

## 2015-02-11 MED ORDER — OMEPRAZOLE 40 MG PO CPDR: 40.0000 mg | DELAYED_RELEASE_CAPSULE | Freq: Every day | ORAL | Status: DC

## 2015-02-11 MED ORDER — SERTRALINE HCL 50 MG PO TABS: 50.0000 mg | ORAL_TABLET | Freq: Every day | ORAL | Status: DC

## 2015-02-11 MED ORDER — SIMVASTATIN 80 MG PO TABS
80.0000 mg | ORAL_TABLET | Freq: Every day | ORAL | Status: DC
Start: 2015-02-11 — End: 2015-09-15

## 2015-02-11 MED ORDER — HYDROCHLOROTHIAZIDE 25 MG PO TABS: 25.0000 mg | ORAL_TABLET | Freq: Every day | ORAL | Status: DC

## 2015-02-11 NOTE — Progress Notes (Signed)
Pre visit review using our clinic review tool, if applicable. No additional management support is needed unless otherwise documented below in the visit note. 

## 2015-02-11 NOTE — Patient Instructions (Signed)
Follow up in 3-4 months to recheck diabetes We'll notify you of your lab results and make any changes if needed Please have your eye doctor send me copies of their note You are due for your colonoscopy this year w/ Dr Watt Climes Call and verify when you are due for your mammo at [phone] [facility]) Try and make healthy food choices and get regular activity Call with any questions or concerns Hang in there!!!

## 2015-02-11 NOTE — Progress Notes (Signed)
   Subjective:    Patient ID: Rachael Buck, female    DOB: 1940-12-23, 74 y.o.   MRN: [id number]  HPI Here today for CPE.  Risk Factors: DM- chronic problem, on Metformin.  On ARB for renal protection.  Due for eye exam- has appt upcoming.  UTD on foot exam. HTN- chronic problem, on Losartan, HCTZ.  Adequate control. Hyperlipidemia- chronic problem, on Simvastatin. NASH w/ varices- chronic problem.  Following w/ Dr Watt Climes.  Physical Activity: limited Fall Risk: low risk Depression: denies Hearing: normal to conversational tones but decreased in L ear ADL's: independent Cognitive: normal linear thought process, memory and attention intact Home Safety: safe at home, lives w/ husband Height, Weight, BMI, Visual Acuity: see vitals, vision corrected to 20/20 w/ glasses Counseling: UTD on colonoscopy (2013), mammo (12/15 per pt report), due for DEXA but pt not interested. Labs Ordered: See A&P Care Plan: See A&P    Review of Systems Patient reports no vision, adenopathy,fever, weight change,  persistant/recurrent hoarseness , swallowing issues, chest pain, palpitations, edema, persistant/recurrent cough, hemoptysis, dyspnea (rest/exertional/paroxysmal nocturnal), gastrointestinal bleeding (melena, rectal bleeding), significant heartburn, bowel changes, GU symptoms (dysuria, hematuria, incontinence), Gyn symptoms (abnormal  bleeding, pain),  syncope, focal weakness, memory loss, numbness & tingling, skin/hair/nail changes, abnormal bruising or bleeding, anxiety, or depression.   Decreased hearing in L ear + chronic abd pain    Objective:   Physical Exam General Appearance:    Alert, cooperative, no distress, appears stated age  Head:    Normocephalic, without obvious abnormality, atraumatic  Eyes:    PERRL, conjunctiva/corneas clear, EOM's intact, fundi    benign, both eyes  Ears:    Normal TM's and external ear canals, both ears  Nose:   Nares normal, septum midline, mucosa  normal, no drainage    or sinus tenderness  Throat:   Lips, mucosa, and tongue normal; teeth and gums normal  Neck:   Supple, symmetrical, trachea midline, no adenopathy;    Thyroid: no enlargement/tenderness/nodules  Back:     Symmetric, no curvature, ROM normal, no CVA tenderness  Lungs:     Clear to auscultation bilaterally, respirations unlabored  Chest Wall:    No tenderness or deformity   Heart:    Regular rate and rhythm, S1 and S2 normal, no murmur, rub   or gallop  Breast Exam:    Deferred to GYN  Abdomen:     Soft, non-tender, bowel sounds active all four quadrants,    + hepatomegaly  Genitalia:    Deferred to GYN  Rectal:    Extremities:   Extremities normal, atraumatic, no cyanosis or edema  Pulses:   2+ and symmetric all extremities  Skin:   Skin color, texture, turgor normal, no rashes or lesions  Lymph nodes:   Cervical, supraclavicular, and axillary nodes normal  Neurologic:   CNII-XII intact, normal strength, sensation and reflexes    throughout          Assessment & Plan:

## 2015-02-23 NOTE — Assessment & Plan Note (Signed)
Chronic problem.  Following regularly w/ GI (Dr Watt Climes).  Currently asymptomatic.  Check labs.  Will follow along and assist as able.

## 2015-02-23 NOTE — Assessment & Plan Note (Signed)
Chronic problem.  Adequate control on current meds.  Asymptomatic at this time.  Check labs.  No anticipated med changes.

## 2015-02-23 NOTE — Assessment & Plan Note (Signed)
Chronic problem.  Usually adequate control.  UTD on foot exam.  Has upcoming eye exam.  On ARB for renal protection.  Check labs.  Adjust meds prn

## 2015-02-23 NOTE — Assessment & Plan Note (Signed)
Pt's PE unchanged from previous.  UTD on colonoscopy, mammo, declines DEXA.  Written screening schedule updated and given to pt.  Check labs.  Anticipatory guidance provided.

## 2015-02-23 NOTE — Assessment & Plan Note (Signed)
Chronic problem.  Tolerating statin w/o difficulty.  Check labs.  Adjust meds prn  

## 2015-02-23 NOTE — Assessment & Plan Note (Signed)
Chronic problem.  Following w/ Dr Watt Climes.  Not currently on beta blocker- will defer initiation of this to him.  Currently asymptomatic.  Will continue to follow.

## 2015-02-24 ENCOUNTER — Telehealth: Payer: Self-pay | Admitting: Family Medicine

## 2015-02-24 NOTE — Telephone Encounter (Signed)
Relation to pt: self Call back number: [phone]   Reason for call:  Pt inquiring about lab results taken 02/11/2015

## 2015-02-25 NOTE — Telephone Encounter (Signed)
Pt notified of lab results

## 2015-03-01 ENCOUNTER — Emergency Department (HOSPITAL_BASED_OUTPATIENT_CLINIC_OR_DEPARTMENT_OTHER)
Admission: EM | Admit: 2015-03-01 | Discharge: 2015-03-01 | Disposition: A | Discharge status: Home / Self Care | Payer: Medicare Other | Attending: Emergency Medicine | Admitting: Emergency Medicine

## 2015-03-01 ENCOUNTER — Encounter (HOSPITAL_BASED_OUTPATIENT_CLINIC_OR_DEPARTMENT_OTHER): Payer: Self-pay | Admitting: *Deleted

## 2015-03-01 DIAGNOSIS — E785 Hyperlipidemia, unspecified: Secondary | ICD-10-CM | POA: Insufficient documentation

## 2015-03-01 DIAGNOSIS — E119 Type 2 diabetes mellitus without complications: Secondary | ICD-10-CM | POA: Insufficient documentation

## 2015-03-01 DIAGNOSIS — K219 Gastro-esophageal reflux disease without esophagitis: Secondary | ICD-10-CM | POA: Diagnosis not present

## 2015-03-01 DIAGNOSIS — Z79899 Other long term (current) drug therapy: Secondary | ICD-10-CM | POA: Insufficient documentation

## 2015-03-01 DIAGNOSIS — R21 Rash and other nonspecific skin eruption: Secondary | ICD-10-CM | POA: Diagnosis present

## 2015-03-01 DIAGNOSIS — B09 Unspecified viral infection characterized by skin and mucous membrane lesions: Secondary | ICD-10-CM | POA: Insufficient documentation

## 2015-03-01 DIAGNOSIS — Z87891 Personal history of nicotine dependence: Secondary | ICD-10-CM | POA: Diagnosis not present

## 2015-03-01 DIAGNOSIS — Z86010 Personal history of colonic polyps: Secondary | ICD-10-CM | POA: Diagnosis not present

## 2015-03-01 DIAGNOSIS — I10 Essential (primary) hypertension: Secondary | ICD-10-CM | POA: Diagnosis not present

## 2015-03-01 DIAGNOSIS — Z8739 Personal history of other diseases of the musculoskeletal system and connective tissue: Secondary | ICD-10-CM | POA: Insufficient documentation

## 2015-03-01 DIAGNOSIS — E669 Obesity, unspecified: Secondary | ICD-10-CM | POA: Diagnosis not present

## 2015-03-01 DIAGNOSIS — J45909 Unspecified asthma, uncomplicated: Secondary | ICD-10-CM | POA: Insufficient documentation

## 2015-03-01 NOTE — ED Notes (Signed)
Pt reports that her grandson had hand foot and mouth last week.  States that she has multiple bumps on bilateral hands and on her face.

## 2015-03-01 NOTE — ED Provider Notes (Signed)
CSN: [id number]     Arrival date & time 03/01/15  1051 History   First MD Initiated Contact with Patient 03/01/15 1207     Chief Complaint  Patient presents with  . Rash     (Consider location/radiation/quality/duration/timing/severity/associated sxs/prior Treatment) Patient is a 74 y.o. female presenting with rash. The history is provided by the patient. No language interpreter was used.  Rash Location:  Full body Quality: redness   Severity:  Mild Onset quality:  Gradual Duration:  3 days Timing:  Constant Chronicity:  New Context: exposure to similar rash   Worsened by:  Contact Ineffective treatments:  None tried Associated symptoms: no fever, no URI and not vomiting    Pt reports her grandson had hand foot and mouth.  Pt reports she now has the same rash. Past Medical History  Diagnosis Date  . Arthritis   . Asthma   . Diabetes mellitus   . Diverticulitis   . GERD (gastroesophageal reflux disease)   . Hypertension   . Colon polyps   . NASH (nonalcoholic steatohepatitis)   . Hyperlipidemia   . Obesity   . Cirrhosis, nonalcoholic    Past Surgical History  Procedure Laterality Date  . Cholecystectomy  1981  . Partial hysterectomy  1972  . Oophorectomy  1973  . Rotator cuff repair      bilateral. 2006, 2008  . Vein surgery     Family History  Problem Relation Age of Onset  . Breast cancer Daughter   . Breast cancer Maternal Aunt   . Diabetes Brother   . Diabetes Sister   . Stomach cancer Mother   . Cancer Other   . GER disease Other   . Obesity Other    History  Substance Use Topics  . Smoking status: Former Research scientist (life sciences)  . Smokeless tobacco: Not on file  . Alcohol Use: No   OB History    No data available     Review of Systems  Constitutional: Negative for fever.  Gastrointestinal: Negative for vomiting.  Skin: Positive for rash.  All other systems reviewed and are negative.     Allergies  Codeine and Shrimp  Home Medications   Prior to  Admission medications   Medication Sig Start Date End Date Taking? Authorizing Provider  albuterol (PROAIR HFA) 108 (90 BASE) MCG/ACT inhaler Inhale 2 puffs into the lungs every 4 (four) hours as needed for wheezing or shortness of breath. 01/09/15   Midge Minium, MD  ALPRAZolam Duanne Moron) 0.5 MG tablet TAKE 1 TABLET BY MOUTH THREE TIMES DAILY AS NEEDED FOR SLEEP OR ANXIETY 01/09/15   Midge Minium, MD  dicyclomine (BENTYL) 20 MG tablet Take 1 tablet by mouth 3 (three) times daily as needed. 11/12/14   Historical Provider, MD  glucose blood (ONE TOUCH ULTRA TEST) test strip Use as instructed 05/14/13   Midge Minium, MD  hydrochlorothiazide (HYDRODIURIL) 25 MG tablet Take 1 tablet (25 mg total) by mouth daily. 02/11/15   Midge Minium, MD  hydrocortisone 2.5 % cream Apply 1 application topically 2 (two) times daily as needed (for eczema).    Historical Provider, MD  hyoscyamine (LEVSIN, ANASPAZ) 0.125 MG tablet Take 0.125 mg by mouth every 4 (four) hours as needed for cramping.    Historical Provider, MD  Loperamide HCl (IMODIUM PO) Take 3 tablets by mouth as needed.     Historical Provider, MD  losartan (COZAAR) 50 MG tablet Take 1 tablet (50 mg total) by mouth daily. 02/11/15  Midge Minium, MD  metFORMIN (GLUCOPHAGE) 1000 MG tablet Take 1 tablet (1,000 mg total) by mouth 2 (two) times daily with a meal. 07/26/14   Midge Minium, MD  omeprazole (PRILOSEC) 40 MG capsule Take 1 capsule (40 mg total) by mouth daily. 02/11/15   Midge Minium, MD  [facility] DELICA LANCETS FINE MISC 1 Stick by Does not apply route 2 (two) times daily. 05/14/13   Midge Minium, MD  sertraline (ZOLOFT) 50 MG tablet Take 1 tablet (50 mg total) by mouth daily. 02/11/15   Midge Minium, MD  simvastatin (ZOCOR) 80 MG tablet Take 1 tablet (80 mg total) by mouth daily. 02/11/15   Midge Minium, MD   BP 141/60 mmHg  Pulse 97  Temp(Src) 98.3 F (36.8 C) (Oral)  Resp 18  Ht 5' 5"  (1.651  m)  Wt 215 lb (97.523 kg)  BMI 35.78 kg/m2  SpO2 97% Physical Exam  Constitutional: She appears well-developed and well-nourished.  HENT:  Head: Normocephalic.  4 small red pimples face  Eyes: Pupils are equal, round, and reactive to light.  Ulcer top of mouth  Neck: Normal range of motion.  Cardiovascular: Normal rate and normal heart sounds.   Pulmonary/Chest: Effort normal.  Abdominal: Soft.  Musculoskeletal: Normal range of motion.  Neurological: She is alert.  Skin:  Small red areas hands, bottom of feet  Nursing note and vitals reviewed.   ED Course  Procedures (including critical care time) Labs Review Labs Reviewed - No data to display  Imaging Review No results found.   EKG Interpretation None      MDM  Pt has pattern of hand foot and mouth disease.  She looks well overall.  She is nontoxic.  She has red areas on both feet and both hands,  He has a red ulcer in her mouth and 4 small pimples on her face.  I advised pt Hand foot and mouth is rare in adults however she should avoid small children.  Pt is advised if fever or worsening symptoms she should return for evaluation.    Final diagnoses:  Viral rash        Fransico Meadow, PA-C 03/01/15 2004  Dorie Rank, MD 03/02/15 [phone]

## 2015-03-01 NOTE — Discharge Instructions (Signed)
Viral Exanthems, Adult Many viral infections of the skin are called viral exanthems. Exanthem is another name for a rash or skin eruption. The most common viral exanthems include the following:  [place] measles or rubella.  Measles or rubeola.  Roseola.  Parvovirus B19 (Erythema infectiosum or Fifth disease).  Chickenpox or varicella. DIAGNOSIS  Sometimes, other problems may cause a rash that looks like a viral exanthem. Most often, your caregiver can determine whether you have a viral exanthem by looking at the rash. They usually have distinct patterns or appearance. Lab work may be done if the diagnosis is uncertain. Sometimes, a small tissue sample (biopsy) of the rash may need to be taken. TREATMENT  Immunizations have led to a decrease in the number of cases of measles, mumps, and rubella. Viral exanthems may require clinical treatment if a bacterial infection or other problems follow. The rash may be associated with:  Minor sore throat.  Aches and pains.  Runny nose.  Watery eyes.  Tiredness.  Some coughs.  Gastrointestinal infections causing nausea, vomiting, and diarrhea. Viral exanthems do not respond to antibiotic medicines, because they are not caused by bacteria. HOME CARE INSTRUCTIONS   Only take over-the-counter or prescription medicines for pain, discomfort, diarrhea, or fever as directed by your caregiver.  Drink enough water and fluids to keep your urine clear or pale yellow. SEEK MEDICAL CARE IF:  You develop swollen neck glands. This may feel like lumps or bumps in the neck.  You develop tenderness over your sinuses.  You are not feeling partly better after 3 days.  You develop muscle aches.  You are feeling more tired than you would expect.  You get a persistent cough with mucus. SEEK IMMEDIATE MEDICAL CARE IF:   You have a fever.  You develop red eyes or eye pain.  You develop sores in your mouth and difficulty drinking or eating.  You  develop a sore throat with pus and difficulty swallowing.  You develop neck pain or a stiff neck.  You develop a severe headache.  You develop vomiting that will not stop. Document Released: 11/06/2002 Document Revised: 11/08/2011 Document Reviewed: 11/03/2010 [facility] Patient Information 2015 [place], [place]. This information is not intended to replace advice given to you by your health care provider. Make sure you discuss any questions you have with your health care provider.

## 2015-03-17 ENCOUNTER — Other Ambulatory Visit: Payer: Self-pay | Admitting: Family Medicine

## 2015-03-17 NOTE — Telephone Encounter (Signed)
Last OV 02-11-15 Alprazolam last filled 01-09-15 #90 with 1  CSC low risk

## 2015-03-17 NOTE — Telephone Encounter (Signed)
Med filled and faxed.  

## 2015-03-31 DIAGNOSIS — D124 Benign neoplasm of descending colon: Secondary | ICD-10-CM | POA: Diagnosis not present

## 2015-03-31 DIAGNOSIS — K5730 Diverticulosis of large intestine without perforation or abscess without bleeding: Secondary | ICD-10-CM | POA: Diagnosis not present

## 2015-03-31 DIAGNOSIS — D122 Benign neoplasm of ascending colon: Secondary | ICD-10-CM | POA: Diagnosis not present

## 2015-03-31 DIAGNOSIS — D126 Benign neoplasm of colon, unspecified: Secondary | ICD-10-CM | POA: Diagnosis not present

## 2015-03-31 DIAGNOSIS — K317 Polyp of stomach and duodenum: Secondary | ICD-10-CM | POA: Diagnosis not present

## 2015-03-31 DIAGNOSIS — Z86010 Personal history of colonic polyps: Secondary | ICD-10-CM | POA: Diagnosis not present

## 2015-03-31 DIAGNOSIS — K6389 Other specified diseases of intestine: Secondary | ICD-10-CM | POA: Diagnosis not present

## 2015-03-31 DIAGNOSIS — K219 Gastro-esophageal reflux disease without esophagitis: Secondary | ICD-10-CM | POA: Diagnosis not present

## 2015-03-31 DIAGNOSIS — Z09 Encounter for follow-up examination after completed treatment for conditions other than malignant neoplasm: Secondary | ICD-10-CM | POA: Diagnosis not present

## 2015-03-31 DIAGNOSIS — R1084 Generalized abdominal pain: Secondary | ICD-10-CM | POA: Diagnosis not present

## 2015-03-31 DIAGNOSIS — K449 Diaphragmatic hernia without obstruction or gangrene: Secondary | ICD-10-CM | POA: Diagnosis not present

## 2015-03-31 LAB — HM COLONOSCOPY

## 2015-03-31 LAB — HM MAMMOGRAPHY: HM Mammogram: NORMAL (ref 0–4)

## 2015-04-03 DIAGNOSIS — E119 Type 2 diabetes mellitus without complications: Secondary | ICD-10-CM | POA: Diagnosis not present

## 2015-04-03 LAB — HM DIABETES EYE EXAM

## 2015-04-08 ENCOUNTER — Encounter: Payer: Self-pay | Admitting: General Practice

## 2015-04-16 ENCOUNTER — Other Ambulatory Visit: Payer: Self-pay | Admitting: Family Medicine

## 2015-04-16 NOTE — Telephone Encounter (Signed)
Last OV 02/11/15 Alprazolam last filled 03/17/15 #90 with 0  CSC, low risk, UDS due

## 2015-04-16 NOTE — Telephone Encounter (Signed)
Medication filled to pharmacy as requested.   

## 2015-04-17 DIAGNOSIS — K7581 Nonalcoholic steatohepatitis (NASH): Secondary | ICD-10-CM | POA: Diagnosis not present

## 2015-04-17 DIAGNOSIS — Z86010 Personal history of colonic polyps: Secondary | ICD-10-CM | POA: Diagnosis not present

## 2015-04-17 DIAGNOSIS — R109 Unspecified abdominal pain: Secondary | ICD-10-CM | POA: Diagnosis not present

## 2015-05-13 DIAGNOSIS — Z23 Encounter for immunization: Secondary | ICD-10-CM | POA: Diagnosis not present

## 2015-05-20 ENCOUNTER — Other Ambulatory Visit: Payer: Self-pay | Admitting: Family Medicine

## 2015-05-20 NOTE — Telephone Encounter (Signed)
Last OV 02/11/15 Alprazolam last filled 04/16/15 #90 with 0

## 2015-05-21 NOTE — Telephone Encounter (Signed)
Medication filled to pharmacy as requested.   

## 2015-05-22 ENCOUNTER — Other Ambulatory Visit: Payer: Self-pay | Admitting: General Practice

## 2015-05-22 MED ORDER — HYOSCYAMINE SULFATE 0.125 MG PO TABS: 0.1250 mg | ORAL_TABLET | ORAL | Status: DC | PRN

## 2015-05-26 DIAGNOSIS — R109 Unspecified abdominal pain: Secondary | ICD-10-CM | POA: Diagnosis not present

## 2015-05-26 DIAGNOSIS — R932 Abnormal findings on diagnostic imaging of liver and biliary tract: Secondary | ICD-10-CM | POA: Diagnosis not present

## 2015-05-26 DIAGNOSIS — A09 Infectious gastroenteritis and colitis, unspecified: Secondary | ICD-10-CM | POA: Diagnosis not present

## 2015-06-02 ENCOUNTER — Ambulatory Visit (INDEPENDENT_AMBULATORY_CARE_PROVIDER_SITE_OTHER): Payer: Medicare Other | Admitting: Family Medicine

## 2015-06-02 ENCOUNTER — Encounter: Payer: Self-pay | Admitting: Family Medicine

## 2015-06-02 VITALS — BP 127/72 | HR 99 | Temp 97.0°F | Ht 65.0 in | Wt 217.8 lb

## 2015-06-02 DIAGNOSIS — E119 Type 2 diabetes mellitus without complications: Secondary | ICD-10-CM

## 2015-06-02 DIAGNOSIS — R4181 Age-related cognitive decline: Secondary | ICD-10-CM | POA: Diagnosis not present

## 2015-06-02 LAB — BASIC METABOLIC PANEL
BUN: 8 mg/dL (ref 6–23)
CO2: 30 mEq/L (ref 19–32)
Calcium: 9.2 mg/dL (ref 8.4–10.5)
Chloride: 95 mEq/L — ABNORMAL LOW (ref 96–112)
Creatinine, Ser: 0.46 mg/dL (ref 0.40–1.20)
GFR: 141.54 mL/min (ref >60.00)
Glucose, Bld: 147 mg/dL — ABNORMAL HIGH (ref 70–99)
Potassium: 3.4 mEq/L — ABNORMAL LOW (ref 3.5–5.1)
Sodium: 137 mEq/L (ref 135–145)

## 2015-06-02 LAB — HEMOGLOBIN A1C: Hgb A1c MFr Bld: 7.6 % — ABNORMAL HIGH (ref 4.6–6.5)

## 2015-06-02 NOTE — Assessment & Plan Note (Signed)
Ongoing issue for pt.  UTD on eye exam, on ARB for renal protection.  Asymptomatic at this time.  Tolerating metformin w/o difficulty.  Check labs.  Adjust meds prn

## 2015-06-02 NOTE — Assessment & Plan Note (Signed)
New.  Reassured pt that memory is fine.  Her mild slowing of ability to learn new information is an expected consequence of aging and not at all outside the norm.  The fact that she is able to learn and retain the info is very encouraging.  Pt was pleased after our discussion.  No med changes at this time.

## 2015-06-02 NOTE — Progress Notes (Signed)
   Subjective:    Patient ID: Rachael Buck, female    DOB: September 13, 1940, 74 y.o.   MRN: [id number]  HPI Medication review- 'what can be affecting my brain?'  'i don't remember like I used to but it's not bad'.  No difficulty with driving directions, names/faces of close friends and family.  Having difficulty 'when someone is teaching me something new'.  'it's just a concern'.  Pt's mind altering meds are alprazolam and Hyoscyamine.  Pt reports that she is able to learn the new tasks but it just takes a day or 2.  But once she has, she doesn't forget it.  DM- chronic problem, on Metformin.  On ARB for renal protection.  UTD on eye exam, foot exam.  Denies symptomatic lows.  Denies CP, SOB, HAs, visual changes, edema.   Review of Systems For ROS see HPI     Objective:   Physical Exam  Constitutional: She is oriented to person, place, and time. She appears well-developed and well-nourished. No distress.  HENT:  Head: Normocephalic and atraumatic.  Eyes: Conjunctivae and EOM are normal. Pupils are equal, round, and reactive to light.  Neck: Normal range of motion. Neck supple. No thyromegaly present.  Cardiovascular: Normal rate, regular rhythm, normal heart sounds and intact distal pulses.   No murmur heard. Pulmonary/Chest: Effort normal and breath sounds normal. No respiratory distress.  Abdominal: Soft. She exhibits no distension. There is no tenderness.  Musculoskeletal: She exhibits no edema.  Lymphadenopathy:    She has no cervical adenopathy.  Neurological: She is alert and oriented to person, place, and time.  Skin: Skin is warm and dry.  Psychiatric: She has a normal mood and affect. Her behavior is normal.  Vitals reviewed.         Assessment & Plan:

## 2015-06-02 NOTE — Progress Notes (Signed)
Pre visit review using our clinic review tool, if applicable. No additional management support is needed unless otherwise documented below in the visit note. 

## 2015-06-02 NOTE — Patient Instructions (Signed)
Follow up in 3-4 months to recheck diabetes and cholesterol We'll notify you of your lab results and make any changes if needed Continue to work on healthy diet and regular activity- you can do it! Your difficulty w/ learning a new task is to be expected and not concerning Call with any questions or concerns Happy Early Rudene Anda!!!

## 2015-06-04 ENCOUNTER — Other Ambulatory Visit: Payer: Self-pay | Admitting: General Practice

## 2015-06-04 MED ORDER — DICYCLOMINE HCL 20 MG PO TABS: 20.0000 mg | ORAL_TABLET | Freq: Three times a day (TID) | ORAL | Status: DC | PRN

## 2015-07-04 ENCOUNTER — Encounter: Payer: Self-pay | Admitting: Medical

## 2015-07-04 ENCOUNTER — Ambulatory Visit (INDEPENDENT_AMBULATORY_CARE_PROVIDER_SITE_OTHER): Payer: Medicare Other | Admitting: Medical

## 2015-07-04 VITALS — BP 122/76 | HR 67 | Temp 98.0°F | Ht 65.0 in | Wt 219.0 lb

## 2015-07-04 DIAGNOSIS — J0100 Acute maxillary sinusitis, unspecified: Secondary | ICD-10-CM | POA: Diagnosis not present

## 2015-07-04 DIAGNOSIS — J209 Acute bronchitis, unspecified: Secondary | ICD-10-CM | POA: Diagnosis not present

## 2015-07-04 MED ORDER — ALBUTEROL SULFATE HFA 108 (90 BASE) MCG/ACT IN AERS: 2.0000 | INHALATION_SPRAY | RESPIRATORY_TRACT | Status: DC | PRN

## 2015-07-04 MED ORDER — AZITHROMYCIN 250 MG PO TABS: ORAL_TABLET | ORAL | Status: DC

## 2015-07-04 MED ORDER — BENZONATATE 100 MG PO CAPS: 100.0000 mg | ORAL_CAPSULE | Freq: Three times a day (TID) | ORAL | Status: DC | PRN

## 2015-07-04 MED ORDER — FLUTICASONE PROPIONATE 50 MCG/ACT NA SUSP: 2.0000 | Freq: Every day | NASAL | Status: DC

## 2015-07-04 MED ORDER — GLUCOSE BLOOD VI STRP: ORAL_STRIP | Status: DC

## 2015-07-04 MED ORDER — BECLOMETHASONE DIPROPIONATE 40 MCG/ACT IN AERS: 2.0000 | INHALATION_SPRAY | Freq: Two times a day (BID) | RESPIRATORY_TRACT | Status: DC

## 2015-07-04 NOTE — Progress Notes (Signed)
Pre visit review using our clinic review tool, if applicable. No additional management support is needed unless otherwise documented below in the visit note. 

## 2015-07-04 NOTE — Progress Notes (Signed)
Subjective:    Patient ID: Rachael Buck, female    DOB: 1940/12/15, 74 y.o.   MRN: [id number]  HPI   Pt in for sick for 3 wks. Pt states feel chest congestion. Has a lot of cough day and night.  Pt may be wheezing some. Pt has history of inhaler use.   Some sneezing with some sinus pressure.  Pt has ventolin inhaler. Using 2-3 times a day over past 2-3 wks.   Review of Systems  Constitutional: Positive for fatigue. Negative for fever and chills.  HENT: Positive for congestion, sinus pressure and sneezing. Negative for postnasal drip.   Respiratory: Positive for cough and wheezing.        Mild productive cough.  Cardiovascular: Negative for chest pain and palpitations.  Musculoskeletal: Negative for back pain.  Neurological: Negative for dizziness, facial asymmetry and numbness.  Hematological: Negative for adenopathy. Does not bruise/bleed easily.    Past Medical History  Diagnosis Date  . Arthritis   . Asthma   . Diabetes mellitus   . Diverticulitis   . GERD (gastroesophageal reflux disease)   . Hypertension   . Colon polyps   . NASH (nonalcoholic steatohepatitis)   . Hyperlipidemia   . Obesity   . Cirrhosis, nonalcoholic ([place])     Social History   Social History  . Marital Status: Married    Spouse Name: N/A  . Number of Children: N/A  . Years of Education: N/A   Occupational History  . Not on file.   Social History Main Topics  . Smoking status: Former Research scientist (life sciences)  . Smokeless tobacco: Not on file  . Alcohol Use: No  . Drug Use: No  . Sexual Activity: Not on file   Other Topics Concern  . Not on file   Social History Narrative    Past Surgical History  Procedure Laterality Date  . Cholecystectomy  1981  . Partial hysterectomy  1972  . Oophorectomy  1973  . Rotator cuff repair      bilateral. 2006, 2008  . Vein surgery      Family History  Problem Relation Age of Onset  . Breast cancer Daughter   . Breast cancer Maternal Aunt   .  Diabetes Brother   . Diabetes Sister   . Stomach cancer Mother   . Cancer Other   . GER disease Other   . Obesity Other     Allergies  Allergen Reactions  . Codeine Nausea And Vomiting  . Shrimp [Shellfish Allergy] Swelling    Current Outpatient Prescriptions on File Prior to Visit  Medication Sig Dispense Refill  . albuterol (PROAIR HFA) 108 (90 BASE) MCG/ACT inhaler Inhale 2 puffs into the lungs every 4 (four) hours as needed for wheezing or shortness of breath. 1 Inhaler 6  . ALPRAZolam (XANAX) 0.5 MG tablet TAKE 1 TABLET BY MOUTH THREE TIMES DAILY AS NEEDED FOR ANXIETY OR SLEEP 90 tablet 3  . dicyclomine (BENTYL) 20 MG tablet Take 1 tablet (20 mg total) by mouth 3 (three) times daily as needed. 360 tablet 1  . glucose blood (ONE TOUCH ULTRA TEST) test strip Use as instructed 100 each 12  . hydrochlorothiazide (HYDRODIURIL) 25 MG tablet Take 1 tablet (25 mg total) by mouth daily. 90 tablet 1  . hydrocortisone 2.5 % cream Apply 1 application topically 2 (two) times daily as needed (for eczema).    . hyoscyamine (LEVSIN, ANASPAZ) 0.125 MG tablet Take 1 tablet (0.125 mg total) by mouth every  4 (four) hours as needed for cramping. 90 tablet 0  . Loperamide HCl (IMODIUM PO) Take 3 tablets by mouth as needed.     Marland Kitchen losartan (COZAAR) 50 MG tablet Take 1 tablet (50 mg total) by mouth daily. 90 tablet 1  . metFORMIN (GLUCOPHAGE) 1000 MG tablet Take 1 tablet (1,000 mg total) by mouth 2 (two) times daily with a meal. 180 tablet 3  . omeprazole (PRILOSEC) 40 MG capsule Take 1 capsule (40 mg total) by mouth daily. 90 capsule 1  . ONETOUCH DELICA LANCETS FINE MISC 1 Stick by Does not apply route 2 (two) times daily. 100 each 3  . sertraline (ZOLOFT) 50 MG tablet Take 1 tablet (50 mg total) by mouth daily. 90 tablet 1  . simvastatin (ZOCOR) 80 MG tablet Take 1 tablet (80 mg total) by mouth daily. 90 tablet 1   No current facility-administered medications on file prior to visit.    BP 122/76  mmHg  Pulse 67  Temp(Src) 98 F (36.7 C) (Oral)  Ht 5\' 5"  (1.651 m)  Wt 219 lb (99.338 kg)  BMI 36.44 kg/m2  SpO2 98%       Objective:   Physical Exam   General  Mental Status - Alert. General Appearance - Well groomed. Not in acute distress.  Skin Rashes- No Rashes.  HEENT Head- Normal. Ear Auditory Canal - Left- Normal. Right - Normal.Tympanic Membrane- Left- Normal. Right- Normal. Eye Sclera/Conjunctiva- Left- Normal. Right- Normal. Nose & Sinuses Nasal Mucosa- Left-  boggy + Congested. Right-   boggy + Congested. Rt maxillary sinus pressure Mouth & Throat Lips: Upper Lip- Normal: no dryness, cracking, pallor, cyanosis, or vesicular eruption. Lower Lip-Normal: no dryness, cracking, pallor, cyanosis or vesicular eruption. Buccal Mucosa- Bilateral- No Aphthous ulcers. Oropharynx- No Discharge or Erythema. Tonsils: Characteristics- Bilateral- No Erythema or Congestion. Size/Enlargement- Bilateral- No enlargement. Discharge- bilateral-None.  Neck Neck- Supple. No Masses.   Chest and Lung Exam Auscultation: Breath Sounds:- even and unlabored, but bilateral upper lobe rhonchi.  Cardiovascular Auscultation:Rythm- Regular, rate and rhythm. Murmurs & Other Heart Sounds:Ausculatation of the heart reveal- No Murmurs.  Lymphatic Head & Neck General Head & Neck Lymphatics: Bilateral: Description- No Localized lymphadenopathy.      Assessment & Plan:  Rest hydrate tylenol or fever.  Azithromycin antibiotic for bronchitis and sinusitis. Benzonatate for cough. Refill your flonase. Add qvar inhaler to use daily for at least 2-3 wks.  Refill proventil inhaler.  Follow up in 7 days or as needed

## 2015-07-04 NOTE — Patient Instructions (Addendum)
Rest hydrate tylenol or fever.  Azithromycin antibiotic for bronchitis and sinusitis. Benzonatate for cough. Refill your flonase. Add qvar inhaler to use daily for at least 2-3 wks.  Refill proventil inhaler.  Follow up in 7 days or as needed

## 2015-07-28 DIAGNOSIS — R932 Abnormal findings on diagnostic imaging of liver and biliary tract: Secondary | ICD-10-CM | POA: Diagnosis not present

## 2015-07-28 DIAGNOSIS — A09 Infectious gastroenteritis and colitis, unspecified: Secondary | ICD-10-CM | POA: Diagnosis not present

## 2015-07-28 DIAGNOSIS — K7581 Nonalcoholic steatohepatitis (NASH): Secondary | ICD-10-CM | POA: Diagnosis not present

## 2015-07-29 ENCOUNTER — Telehealth: Payer: Self-pay | Admitting: Family Medicine

## 2015-07-29 NOTE — Telephone Encounter (Signed)
Caller name: Sharyn Lull   Relationship to patient: Manuela Neptune w/ [employer] [facility]   Can be reached: [phone]  Reason for call: She's calling in to follow up on a form sent for pt on 11/27 for test strips. She says that PCP signature is required.

## 2015-07-30 NOTE — Telephone Encounter (Signed)
Called pt trying to verify information. Found what I was looking for in her chart. Paperwork on Tabori's counter for signature.

## 2015-08-13 NOTE — Telephone Encounter (Signed)
[facility] called to f/u on form below. Pt keeps calling pharmacy. I told the pharmacy to have pt call us.

## 2015-08-13 NOTE — Telephone Encounter (Signed)
This was faxed already.

## 2015-09-05 ENCOUNTER — Encounter: Payer: Self-pay | Admitting: Family Medicine

## 2015-09-05 ENCOUNTER — Ambulatory Visit (INDEPENDENT_AMBULATORY_CARE_PROVIDER_SITE_OTHER): Payer: PPO | Admitting: Family Medicine

## 2015-09-05 VITALS — BP 119/61 | HR 103 | Temp 98.1°F | Resp 16 | Ht 65.0 in | Wt 217.0 lb

## 2015-09-05 DIAGNOSIS — J45909 Unspecified asthma, uncomplicated: Secondary | ICD-10-CM | POA: Diagnosis not present

## 2015-09-05 DIAGNOSIS — J209 Acute bronchitis, unspecified: Secondary | ICD-10-CM

## 2015-09-05 MED ORDER — IPRATROPIUM-ALBUTEROL 0.5-2.5 (3) MG/3ML IN SOLN
3.0000 mL | Freq: Once | RESPIRATORY_TRACT | Status: AC
  Administered 2015-09-05: 3 mL via RESPIRATORY_TRACT

## 2015-09-05 MED ORDER — PROMETHAZINE-DM 6.25-15 MG/5ML PO SYRP: 5.0000 mL | ORAL_SOLUTION | Freq: Four times a day (QID) | ORAL | Status: DC | PRN

## 2015-09-05 MED ORDER — AZITHROMYCIN 250 MG PO TABS: ORAL_TABLET | ORAL | Status: DC

## 2015-09-05 NOTE — Progress Notes (Signed)
Pre visit review using our clinic review tool, if applicable. No additional management support is needed unless otherwise documented below in the visit note. 

## 2015-09-05 NOTE — Assessment & Plan Note (Signed)
Recurrent problem for pt.  Start Zpack as pt reports this is what usually improves her sxs.  Continue albuterol.  Wheezing improved w/ neb tx in office.  Cough meds prn.  Reviewed supportive care and red flags that should prompt return.  Pt expressed understanding and is in agreement w/ plan.

## 2015-09-05 NOTE — Addendum Note (Signed)
Addended by: Kathlen Brunswick on: 09/05/2015 04:38 PM   Modules accepted: Orders

## 2015-09-05 NOTE — Progress Notes (Signed)
   Subjective:    Patient ID: Rachael Buck, female    DOB: December 21, 1940, 75 y.o.   MRN: [id number]  HPI URI- sxs started ~10 days ago.  Unable to sleep at night due to cough.  Taking prescription cough meds, using inhaler.  Took Zycam at onset of illness w/o relief.  Denies fever.  'it's just a dry, hacking cough'.  + sinus pressure.  L ear pain.  + sick contacts.  Pt has underlying asthma.   Review of Systems For ROS see HPI     Objective:   Physical Exam  Constitutional: She is oriented to person, place, and time. She appears well-developed and well-nourished. No distress.  HENT:  Head: Normocephalic and atraumatic.  TMs normal bilaterally Mild nasal congestion Throat w/out erythema, edema, or exudate Mild TTP over maxillary sinuses- no TTP over frontal sinuses  Eyes: Conjunctivae and EOM are normal. Pupils are equal, round, and reactive to light.  Neck: Normal range of motion. Neck supple.  Cardiovascular: Normal rate, regular rhythm, normal heart sounds and intact distal pulses.   No murmur heard. Pulmonary/Chest: Effort normal. No respiratory distress. She has wheezes (faint, scattered expiratory wheezes- improved s/p neb tx).  + hacking cough  Lymphadenopathy:    She has no cervical adenopathy.  Neurological: She is alert and oriented to person, place, and time.  Skin: Skin is warm and dry.  Psychiatric: She has a normal mood and affect. Her behavior is normal. Thought content normal.  Vitals reviewed.         Assessment & Plan:

## 2015-09-05 NOTE — Patient Instructions (Signed)
Follow up as needed Start the Zpack as directed Use the cough syrup for nights/weekends- will cause drowsiness REST! Mucinex DM for daytime cough Call with any questions or concerns Hang in there!!!

## 2015-09-15 ENCOUNTER — Other Ambulatory Visit: Payer: Self-pay | Admitting: Family Medicine

## 2015-09-16 ENCOUNTER — Encounter: Payer: Self-pay | Admitting: Family Medicine

## 2015-09-16 ENCOUNTER — Ambulatory Visit (INDEPENDENT_AMBULATORY_CARE_PROVIDER_SITE_OTHER): Payer: PPO | Admitting: Family Medicine

## 2015-09-16 VITALS — BP 140/68 | HR 100 | Temp 98.1°F | Ht 65.0 in | Wt 216.6 lb

## 2015-09-16 DIAGNOSIS — I10 Essential (primary) hypertension: Secondary | ICD-10-CM

## 2015-09-16 DIAGNOSIS — E782 Mixed hyperlipidemia: Secondary | ICD-10-CM | POA: Diagnosis not present

## 2015-09-16 DIAGNOSIS — E119 Type 2 diabetes mellitus without complications: Secondary | ICD-10-CM | POA: Diagnosis not present

## 2015-09-16 LAB — HEPATIC FUNCTION PANEL
ALT: 19 U/L (ref 0–35)
AST: 30 U/L (ref 0–37)
Albumin: 4.1 g/dL (ref 3.5–5.2)
Alkaline Phosphatase: 59 U/L (ref 39–117)
Bilirubin, Direct: 0.2 mg/dL (ref 0.0–0.3)
Total Bilirubin: 0.8 mg/dL (ref 0.2–1.2)
Total Protein: 7.8 g/dL (ref 6.0–8.3)

## 2015-09-16 LAB — LIPID PANEL
Cholesterol: 116 mg/dL (ref 0–200)
HDL: 49.5 mg/dL (ref >39.00)
LDL Cholesterol: 37 mg/dL (ref 0–99)
NonHDL: 66.36
Total CHOL/HDL Ratio: 2
Triglycerides: 145 mg/dL (ref 0.0–149.0)
VLDL: 29 mg/dL (ref 0.0–40.0)

## 2015-09-16 LAB — CBC WITH DIFFERENTIAL/PLATELET
Basophils Absolute: 0 10*3/uL (ref 0.0–0.1)
Basophils Relative: 0.2 % (ref 0.0–3.0)
Eosinophils Absolute: 0 10*3/uL (ref 0.0–0.7)
Eosinophils Relative: 0.5 % (ref 0.0–5.0)
HCT: 31.7 % — ABNORMAL LOW (ref 36.0–46.0)
Hemoglobin: 10 g/dL — ABNORMAL LOW (ref 12.0–15.0)
Lymphocytes Relative: 25.4 % (ref 12.0–46.0)
Lymphs Abs: 1.5 10*3/uL (ref 0.7–4.0)
MCHC: 31.4 g/dL (ref 30.0–36.0)
MCV: 78.3 fl (ref 78.0–100.0)
Monocytes Absolute: 0.4 10*3/uL (ref 0.1–1.0)
Monocytes Relative: 7.2 % (ref 3.0–12.0)
Neutro Abs: 4 10*3/uL (ref 1.4–7.7)
Neutrophils Relative %: 66.7 % (ref 43.0–77.0)
Platelets: 106 10*3/uL — ABNORMAL LOW (ref 150.0–400.0)
RBC: 4.05 Mil/uL (ref 3.87–5.11)
RDW: 18 % — ABNORMAL HIGH (ref 11.5–15.5)
WBC: 5.9 10*3/uL (ref 4.0–10.5)

## 2015-09-16 LAB — BASIC METABOLIC PANEL
BUN: 8 mg/dL (ref 6–23)
CO2: 31 mEq/L (ref 19–32)
Calcium: 9.2 mg/dL (ref 8.4–10.5)
Chloride: 96 mEq/L (ref 96–112)
Creatinine, Ser: 0.51 mg/dL (ref 0.40–1.20)
GFR: 125.55 mL/min (ref >60.00)
Glucose, Bld: 217 mg/dL — ABNORMAL HIGH (ref 70–99)
Potassium: 3.6 mEq/L (ref 3.5–5.1)
Sodium: 136 mEq/L (ref 135–145)

## 2015-09-16 LAB — HEMOGLOBIN A1C: Hgb A1c MFr Bld: 7.7 % — ABNORMAL HIGH (ref 4.6–6.5)

## 2015-09-16 LAB — TSH: TSH: 2.35 u[IU]/mL (ref 0.35–4.50)

## 2015-09-16 NOTE — Patient Instructions (Signed)
Follow up in 3-4 months to recheck sugar We'll notify you of your lab results and make any changes if needed Try and make healthy food choices and get regular activity- walking is a great start! Call with any questions or concerns Hang in there!!! Happy New Year!!!

## 2015-09-16 NOTE — Assessment & Plan Note (Signed)
Chronic problem.  Tolerating statin w/o difficulty.  Check labs.  Adjust meds prn  

## 2015-09-16 NOTE — Progress Notes (Signed)
Pre visit review using our clinic review tool, if applicable. No additional management support is needed unless otherwise documented below in the visit note. 

## 2015-09-16 NOTE — Assessment & Plan Note (Signed)
Chronic problem.  Currently asymptomatic.  On ARB for renal protection.  UTD on eye exam, foot exam done today. Stressed need for healthy diet and regular exercise.  Check labs.  Adjust meds prn

## 2015-09-16 NOTE — Progress Notes (Signed)
   Subjective:    Patient ID: Rachael Buck, female    DOB: 1941/04/08, 75 y.o.   MRN: [id number]  HPI DM- chronic problem, on Metformin.  UTD on eye exam.  On ARB.  Due for foot exam.  Home CBG 171 this AM.  Denies symptomatic lows.  Mild intermittent tingling of toes.  No numbness of hands.  HTN- chronic problem, on HCTZ, Losartan.  Adequate control today- but elevated when compared to usual.  Denies CP, SOB above baseline (asthma), HAs, visual changes, edema.  Hyperlipidemia- chronic problem, on Simvastatin.  Denies abd pain, N/V, myalgias.   Review of Systems For ROS see HPI     Objective:   Physical Exam  Constitutional: She is oriented to person, place, and time. She appears well-developed and well-nourished. No distress.  HENT:  Head: Normocephalic and atraumatic.  Eyes: Conjunctivae and EOM are normal. Pupils are equal, round, and reactive to light.  Neck: Normal range of motion. Neck supple. No thyromegaly present.  Cardiovascular: Normal rate, regular rhythm, normal heart sounds and intact distal pulses.   No murmur heard. Pulmonary/Chest: Effort normal and breath sounds normal. No respiratory distress.  Abdominal: Soft. She exhibits no distension. There is no tenderness.  + hepatomegaly  Musculoskeletal: She exhibits no edema.  Lymphadenopathy:    She has no cervical adenopathy.  Neurological: She is alert and oriented to person, place, and time.  Skin: Skin is warm and dry.  Psychiatric: She has a normal mood and affect. Her behavior is normal.  Vitals reviewed.         Assessment & Plan:

## 2015-09-16 NOTE — Assessment & Plan Note (Signed)
Chronic problem.  Adequate control.  Asymptomatic.  Check labs.  No anticipated med changes.  Will follow. 

## 2015-09-17 ENCOUNTER — Telehealth: Payer: Self-pay | Admitting: Family Medicine

## 2015-09-17 NOTE — Telephone Encounter (Signed)
Called patient. Left message on patients answering machine regarding lab results. Advised to call back for information regarding iFOB.

## 2015-09-17 NOTE — Telephone Encounter (Signed)
Caller name: Nelida Meuse   Relationship to patient: Self  Can be reached: [phone]  Reason for call: Pt is returning a call for lab results.

## 2015-09-18 ENCOUNTER — Encounter: Payer: Self-pay | Admitting: Family Medicine

## 2015-09-18 NOTE — Progress Notes (Unsigned)
IFob left at front desk for patient. Labeled with patient nome on each packet and [place] address label  Placed.

## 2015-09-25 ENCOUNTER — Telehealth: Payer: Self-pay

## 2015-09-25 ENCOUNTER — Other Ambulatory Visit: Payer: Self-pay | Admitting: Family Medicine

## 2015-09-25 NOTE — Telephone Encounter (Signed)
-----   Message from Quintin Alto sent at 09/18/2015  4:16 PM EST ----- Regarding: Returned Call Contact: [phone] Patient returned your call about her labs

## 2015-09-25 NOTE — Telephone Encounter (Signed)
Called patient to inform her  Medication(Xanax) has been faxed to pharmacy

## 2015-09-25 NOTE — Telephone Encounter (Signed)
Last OV 09/16/15 Alprazolam last filled 05/21/15 #90 with 3

## 2015-09-26 NOTE — Telephone Encounter (Signed)
Medication filled to pharmacy as requested.   

## 2015-10-27 ENCOUNTER — Other Ambulatory Visit: Payer: Self-pay | Admitting: Family Medicine

## 2015-10-27 NOTE — Telephone Encounter (Signed)
Last OV 09/16/15 Alprazolam last filled 09/25/15 #90 with 0

## 2015-10-27 NOTE — Telephone Encounter (Signed)
Medication filled to pharmacy as requested.   

## 2015-11-21 ENCOUNTER — Ambulatory Visit (INDEPENDENT_AMBULATORY_CARE_PROVIDER_SITE_OTHER): Payer: PPO | Admitting: Family Medicine

## 2015-11-21 ENCOUNTER — Encounter: Payer: Self-pay | Admitting: Family Medicine

## 2015-11-21 VITALS — BP 111/73 | HR 92 | Temp 98.1°F | Resp 20 | Wt 216.0 lb

## 2015-11-21 DIAGNOSIS — I8393 Asymptomatic varicose veins of bilateral lower extremities: Secondary | ICD-10-CM | POA: Diagnosis not present

## 2015-11-21 DIAGNOSIS — I8390 Asymptomatic varicose veins of unspecified lower extremity: Secondary | ICD-10-CM

## 2015-11-21 DIAGNOSIS — I83892 Varicose veins of left lower extremities with other complications: Secondary | ICD-10-CM | POA: Insufficient documentation

## 2015-11-21 MED ORDER — CEPHALEXIN 500 MG PO CAPS: 500.0000 mg | ORAL_CAPSULE | Freq: Three times a day (TID) | ORAL | Status: DC

## 2015-11-21 MED ORDER — OXYCODONE-ACETAMINOPHEN 5-325 MG PO TABS: 1.0000 | ORAL_TABLET | Freq: Two times a day (BID) | ORAL | Status: DC | PRN

## 2015-11-21 NOTE — Progress Notes (Signed)
Patient ID: Rachael Buck, female   DOB: July 04, 1941, 75 y.o.   MRN: [id number]    Rachael Buck , 1941/04/26, 75 y.o., female MRN: [id number]  CC: leg pain Subjective: Pt presents for an acute OV with complaints of leg pain of 3 days duration. Patient states she noticed that her inner left leg developed some "darkened spots "look like small bruises approximately 3 months ago. Since that time she has noticed more of similar spots. She states over the last 1-1/2-2 months she has developed thick varicosities over her left medial calf. She endorses mild discomfort and this area approximately 3 days ago that has since progressed to be painful. She states she was having difficulty sleeping last night secondary to pain in the area. She reports the pain is a constant dull pain. She endorses the area has become warm to touch, but denies erythema, skin break or drainage. The pain is worse with movement and better when applying pressure. Patient is a diabetic, last a1c 7.7. 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  She has had vein surgery in the past (late 70's), on the opposite leg same location. 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 Patient denies history of blood clots in either her or her family members. Patient has tried nothing to ease the pain.  Allergies  Allergen Reactions  . Codeine Nausea And Vomiting  . Shrimp [Shellfish Allergy] Swelling   Social History  Substance Use Topics  . Smoking status: Former Research scientist (life sciences)  . Smokeless tobacco: Not on file  . Alcohol Use: No   Past Medical History  Diagnosis Date  . Arthritis   . Asthma   . Diabetes mellitus   . Diverticulitis   . GERD (gastroesophageal reflux disease)   . Hypertension   . Colon polyps   . NASH (nonalcoholic steatohepatitis)   . Hyperlipidemia   . Obesity   . Cirrhosis, nonalcoholic [facility])    Past Surgical History  Procedure Laterality Date  . Cholecystectomy  1981  .  Partial hysterectomy  1972  . Oophorectomy  1973  . Rotator cuff repair      bilateral. 2006, 2008  . Vein surgery     Family History  Problem Relation Age of Onset  . Breast cancer Daughter   . Breast cancer Maternal Aunt   . Diabetes Brother   . Diabetes Sister   . Stomach cancer Mother   . Cancer Other   . GER disease Other   . Obesity Other      Medication List       This list is accurate as of: 11/21/15  6:56 PM.  Always use your most recent med list.               albuterol 108 (90 Base) MCG/ACT inhaler  Commonly known as:  PROAIR HFA  Inhale 2 puffs into the lungs every 4 (four) hours as needed for wheezing or shortness of breath.     ALPRAZolam 0.5 MG tablet  Commonly known as:  XANAX  TAKE 1 TABLET BY MOUTH THREE TIMES DAILY AS NEEDED FOR ANXIETY OR PAIN     beclomethasone 40 MCG/ACT inhaler  Commonly known as:  QVAR  Inhale 2 puffs into the lungs 2 (two) times daily.     benzonatate 100 MG capsule  Commonly known as:  TESSALON  Take 1 capsule (100 mg total) by mouth 3 (three) times daily as needed for cough.     cephALEXin 500 MG  capsule  Commonly known as:  KEFLEX  Take 1 capsule (500 mg total) by mouth 3 (three) times daily.     colestipol 1 g tablet  Commonly known as:  COLESTID  TK 1-2 TS PO QD     dicyclomine 20 MG tablet  Commonly known as:  BENTYL  Take 1 tablet (20 mg total) by mouth 3 (three) times daily as needed.     fluticasone 50 MCG/ACT nasal spray  Commonly known as:  FLONASE  Place 2 sprays into both nostrils daily.     glucose blood test strip  Commonly known as:  ONE TOUCH ULTRA TEST  Use as instructed     hydrochlorothiazide 25 MG tablet  Commonly known as:  HYDRODIURIL  TAKE 1 TABLET BY MOUTH EVERY DAY     hydrocortisone 2.5 % cream  Apply 1 application topically 2 (two) times daily as needed (for eczema).     hyoscyamine 0.125 MG tablet  Commonly known as:  LEVSIN, ANASPAZ  Take 1 tablet (0.125 mg total) by mouth  every 4 (four) hours as needed for cramping.     IMODIUM PO  Take 3 tablets by mouth as needed.     losartan 50 MG tablet  Commonly known as:  COZAAR  TAKE 1 TABLET BY MOUTH EVERY DAY     metFORMIN 1000 MG tablet  Commonly known as:  GLUCOPHAGE  TAKE 1 TABLET BY MOUTH TWICE DAILY WITH MEALS     omeprazole 40 MG capsule  Commonly known as:  PRILOSEC  TAKE 1 CAPSULE BY MOUTH EVERY DAY     ONETOUCH DELICA LANCETS FINE Misc  1 Stick by Does not apply route 2 (two) times daily.     oxyCODONE-acetaminophen 5-325 MG tablet  Commonly known as:  ROXICET  Take 1 tablet by mouth every 12 (twelve) hours as needed for severe pain.     promethazine-dextromethorphan 6.25-15 MG/5ML syrup  Commonly known as:  PROMETHAZINE-DM  Take 5 mLs by mouth 4 (four) times daily as needed.     sertraline 50 MG tablet  Commonly known as:  ZOLOFT  TAKE 1 TABLET BY MOUTH EVERY DAY     simvastatin 80 MG tablet  Commonly known as:  ZOCOR  TAKE 1 TABLET BY MOUTH EVERY DAY        ROS: Negative, with the exception of above mentioned in HPI  Objective:  BP 111/73 mmHg  Pulse 92  Temp(Src) 98.1 F (36.7 C)  Resp 20  Wt 216 lb (97.977 kg)  SpO2 96% Body mass index is 35.94 kg/(m^2). Gen: Afebrile. No acute distress. Nontoxic in appearance, well developed, well nourished female. Very pleasant. HENT: AT. [place].  MMM, no oral lesions.  Eyes:Pupils Equal Round Reactive to light, Extraocular movements intact,  Conjunctiva without redness, discharge or icterus. CV: RRR Chest: CTAB, no wheeze or crackles. Good air movement, normal resp effort.  Extremity: No erythema, no bruising, no skin break, no ulcerations. Mildly enlarged left lower extremity in comparison to the right. Large varicosities left medial calf. Area is warm to touch in comparison to other leg same location. Mild tenderness to palpation over this area. Negative Homans. +2/4 dorsalis pedis. Neurovascularly intact distally. Neuro:Normal gait.  PERLA. EOMi. Alert. Oriented x3   Assessment/Plan: [name] is a 75 y.o. female present for acute OV for leg pain.  1. Varicose vein of leg - Concerning history with nonalcoholic cirrhosis of the liver, and rapidly developing left lower extremity varicosities. - Discussed with patient  treat her pain with oxycodone twice a day when necessary. Compression stockings prescribed today for patient to wear as much as possible, especially with ambulation. Patient was encouraged to keep her legs elevated above her heart whenever possible. -There was some warmth to the area on exam today, although no erythema. Will cover with Keflex -Patient was educated on alarm signs, if pain increases, redness, fever, or skin discoloration she is to be seen immediately. treat the pain, and obtain imaging studies. - cephALEXin (KEFLEX) 500 MG capsule; Take 1 capsule (500 mg total) by mouth 3 (three) times daily.  Dispense: 28 capsule; Refill: 0 - oxyCODONE-acetaminophen (ROXICET) 5-325 MG tablet; Take 1 tablet by mouth every 12 (twelve) hours as needed for severe pain.  Dispense: 20 tablet; Refill: 0 - Compression stockings - US venous img left lower ext ordered - Follow-up one week  electronically signed by:  Howard Pouch, DO  [place]

## 2015-11-21 NOTE — Patient Instructions (Signed)
Varicose Veins Varicose veins are veins that have become enlarged and twisted. They are usually seen in the legs but can occur in other parts of the body as well. CAUSES This condition is the result of valves in the veins not working properly. Valves in the veins help to return blood from the leg to the heart. If these valves are damaged, blood flows backward and backs up into the veins in the leg near the skin. This causes the veins to become larger. RISK FACTORS People who are on their feet a lot, who are pregnant, or who are overweight are more likely to develop varicose veins. SIGNS AND SYMPTOMS  Bulging, twisted-appearing, bluish veins, most commonly found on the legs.  Leg pain or a feeling of heaviness. These symptoms may be worse at the end of the day.  Leg swelling.  Changes in skin color. DIAGNOSIS A health care provider can usually diagnose varicose veins by examining your legs. Your health care provider may also recommend an ultrasound of your leg veins. TREATMENT Most varicose veins can be treated at home.However, other treatments are available for people who have persistent symptoms or want to improve the cosmetic appearance of the varicose veins. These treatment options include:  Sclerotherapy. A solution is injected into the vein to close it off.  Laser treatment. A laser is used to heat the vein to close it off.  Radiofrequency vein ablation. An electrical current produced by radio waves is used to close off the vein.  Phlebectomy. The vein is surgically removed through small incisions made over the varicose vein.  Vein ligation and stripping. The vein is surgically removed through incisions made over the varicose vein after the vein has been tied (ligated). HOME CARE INSTRUCTIONS  Do not stand or sit in one position for long periods of time. Do not sit with your legs crossed. Rest with your legs raised during the day.  Wear compression stockings as directed by your  health care provider. These stockings help to prevent blood clots and reduce swelling in your legs.  Do not wear other tight, encircling garments around your legs, pelvis, or waist.  Walk as much as possible to increase blood flow.  Raise the foot of your bed at night with 2-inch blocks.  If you get a cut in the skin over the vein and the vein bleeds, lie down with your leg raised and press on it with a clean cloth until the bleeding stops. Then place a bandage (dressing) on the cut. See your health care provider if it continues to bleed. SEEK MEDICAL CARE IF:  The skin around your ankle starts to break down.  You have pain, redness, tenderness, or hard swelling in your leg over a vein.  You are uncomfortable because of leg pain.   This information is not intended to replace advice given to you by your health care provider. Make sure you discuss any questions you have with your health care provider.   Document Released: 05/26/2005 Document Revised: 09/06/2014 Document Reviewed: 01/01/2014 Elsevier Interactive Patient Education 2016 [place].  Use compression stockings, and keep legs elevated if possible. Use pain medications as needed only.  Followup with PCP in 1 week, sooner if worsening.

## 2015-11-22 ENCOUNTER — Emergency Department (HOSPITAL_BASED_OUTPATIENT_CLINIC_OR_DEPARTMENT_OTHER)
Admission: EM | Admit: 2015-11-22 | Discharge: 2015-11-23 | Disposition: A | Discharge status: Home / Self Care | Payer: PPO | Attending: Emergency Medicine | Admitting: Emergency Medicine

## 2015-11-22 ENCOUNTER — Encounter (HOSPITAL_BASED_OUTPATIENT_CLINIC_OR_DEPARTMENT_OTHER): Payer: Self-pay | Admitting: Emergency Medicine

## 2015-11-22 DIAGNOSIS — Z7984 Long term (current) use of oral hypoglycemic drugs: Secondary | ICD-10-CM | POA: Insufficient documentation

## 2015-11-22 DIAGNOSIS — E119 Type 2 diabetes mellitus without complications: Secondary | ICD-10-CM | POA: Diagnosis not present

## 2015-11-22 DIAGNOSIS — Z86010 Personal history of colonic polyps: Secondary | ICD-10-CM | POA: Diagnosis not present

## 2015-11-22 DIAGNOSIS — M79605 Pain in left leg: Secondary | ICD-10-CM | POA: Insufficient documentation

## 2015-11-22 DIAGNOSIS — Z87891 Personal history of nicotine dependence: Secondary | ICD-10-CM | POA: Insufficient documentation

## 2015-11-22 DIAGNOSIS — J45909 Unspecified asthma, uncomplicated: Secondary | ICD-10-CM | POA: Diagnosis not present

## 2015-11-22 DIAGNOSIS — Z7951 Long term (current) use of inhaled steroids: Secondary | ICD-10-CM | POA: Diagnosis not present

## 2015-11-22 DIAGNOSIS — Z79899 Other long term (current) drug therapy: Secondary | ICD-10-CM | POA: Insufficient documentation

## 2015-11-22 DIAGNOSIS — Z792 Long term (current) use of antibiotics: Secondary | ICD-10-CM | POA: Insufficient documentation

## 2015-11-22 DIAGNOSIS — K219 Gastro-esophageal reflux disease without esophagitis: Secondary | ICD-10-CM | POA: Insufficient documentation

## 2015-11-22 DIAGNOSIS — E669 Obesity, unspecified: Secondary | ICD-10-CM | POA: Diagnosis not present

## 2015-11-22 DIAGNOSIS — I10 Essential (primary) hypertension: Secondary | ICD-10-CM | POA: Insufficient documentation

## 2015-11-22 DIAGNOSIS — E785 Hyperlipidemia, unspecified: Secondary | ICD-10-CM | POA: Diagnosis not present

## 2015-11-22 DIAGNOSIS — M79662 Pain in left lower leg: Secondary | ICD-10-CM | POA: Diagnosis not present

## 2015-11-22 MED ORDER — RIVAROXABAN 15 MG PO TABS
15.0000 mg | ORAL_TABLET | Freq: Once | ORAL | Status: AC
  Administered 2015-11-22: 15 mg via ORAL
  Filled 2015-11-22: qty 1

## 2015-11-22 NOTE — ED Notes (Signed)
Pt seen by EDP prior to RN assessment, see MD notes, pending or ders.  

## 2015-11-22 NOTE — ED Notes (Signed)
Pt c/o varicose vein related left leg pain for past 2 days.

## 2015-11-22 NOTE — ED Notes (Signed)
Denies CP or sob. Denies fever.

## 2015-11-22 NOTE — ED Provider Notes (Signed)
CSN: [id number]     Arrival date & time 11/22/15  2048 History  By signing my name below, I, Altamease Oiler, attest that this documentation has been prepared under the direction and in the presence of Shanon Rosser, MD. Electronically Signed: Altamease Oiler, ED Scribe. 11/22/2015. 11:33 PM   Chief Complaint  Patient presents with  . Leg Pain   The history is provided by the patient. No language interpreter was used.   Rachael Buck is a 75 y.o. female who presents to the Emergency Department complaining of constant, worsening, severe, left leg pain with onset 3 days ago. Pt states that the pain is exacerbated by walking and caused her to fall twice PTA. The pain started at varicose veins in the lower left leg. These varicose veins have been present and unchanged in appearance for 2 years. Associated symptoms include the sensation of a "knot" at the left popliteal fossa. Pt denies SOB, chest pain, fever, nausea, vomiting and diarrhea. She was seen at her PCPs office yesterday and given oxycodone for treatment of the pain.  Past Medical History  Diagnosis Date  . Arthritis   . Asthma   . Diabetes mellitus   . Diverticulitis   . GERD (gastroesophageal reflux disease)   . Hypertension   . Colon polyps   . NASH (nonalcoholic steatohepatitis)   . Hyperlipidemia   . Obesity   . Cirrhosis, nonalcoholic [facility])    Past Surgical History  Procedure Laterality Date  . Cholecystectomy  1981  . Partial hysterectomy  1972  . Oophorectomy  1973  . Rotator cuff repair      bilateral. 2006, 2008  . Vein surgery     Family History  Problem Relation Age of Onset  . Breast cancer Daughter   . Breast cancer Maternal Aunt   . Diabetes Brother   . Diabetes Sister   . Stomach cancer Mother   . Cancer Other   . GER disease Other   . Obesity Other    Social History  Substance Use Topics  . Smoking status: Former Research scientist (life sciences)  . Smokeless tobacco: None  . Alcohol Use: No   OB History    No data  available     Review of Systems 10 Systems reviewed and all are negative for acute change except as noted in the HPI.  Allergies  Codeine and Shrimp  Home Medications   Prior to Admission medications   Medication Sig Start Date End Date Taking? Authorizing Provider  oxyCODONE-acetaminophen (ROXICET) 5-325 MG tablet Take 1 tablet by mouth every 12 (twelve) hours as needed for severe pain. 11/21/15  Yes Renee A Kuneff, DO  albuterol (PROAIR HFA) 108 (90 BASE) MCG/ACT inhaler Inhale 2 puffs into the lungs every 4 (four) hours as needed for wheezing or shortness of breath. 07/04/15   Percell Miller Saguier, PA-C  ALPRAZolam Duanne Moron) 0.5 MG tablet TAKE 1 TABLET BY MOUTH THREE TIMES DAILY AS NEEDED FOR ANXIETY OR PAIN 10/27/15   Midge Minium, MD  beclomethasone (QVAR) 40 MCG/ACT inhaler Inhale 2 puffs into the lungs 2 (two) times daily. 07/04/15   Percell Miller Saguier, PA-C  cephALEXin (KEFLEX) 500 MG capsule Take 1 capsule (500 mg total) by mouth 3 (three) times daily. 11/21/15   Renee A Kuneff, DO  colestipol (COLESTID) 1 g tablet TK 1-2 TS PO QD 10/17/15   Historical Provider, MD  dicyclomine (BENTYL) 20 MG tablet Take 1 tablet (20 mg total) by mouth 3 (three) times daily as needed. 06/04/15  Midge Minium, MD  fluticasone (FLONASE) 50 MCG/ACT nasal spray Place 2 sprays into both nostrils daily. 07/04/15   Percell Miller Saguier, PA-C  glucose blood (ONE TOUCH ULTRA TEST) test strip Use as instructed 07/04/15   Midge Minium, MD  hydrochlorothiazide (HYDRODIURIL) 25 MG tablet TAKE 1 TABLET BY MOUTH EVERY DAY 09/15/15   Midge Minium, MD  hydrocortisone 2.5 % cream Apply 1 application topically 2 (two) times daily as needed (for eczema).    Historical Provider, MD  hyoscyamine (LEVSIN, ANASPAZ) 0.125 MG tablet Take 1 tablet (0.125 mg total) by mouth every 4 (four) hours as needed for cramping. 05/22/15   Midge Minium, MD  Loperamide HCl (IMODIUM PO) Take 3 tablets by mouth as needed.     Historical  Provider, MD  losartan (COZAAR) 50 MG tablet TAKE 1 TABLET BY MOUTH EVERY DAY 09/15/15   Midge Minium, MD  metFORMIN (GLUCOPHAGE) 1000 MG tablet TAKE 1 TABLET BY MOUTH TWICE DAILY WITH MEALS 09/15/15   Midge Minium, MD  omeprazole (PRILOSEC) 40 MG capsule TAKE 1 CAPSULE BY MOUTH EVERY DAY 09/15/15   Midge Minium, MD  [facility] DELICA LANCETS FINE MISC 1 Stick by Does not apply route 2 (two) times daily. 05/14/13   Midge Minium, MD  sertraline (ZOLOFT) 50 MG tablet TAKE 1 TABLET BY MOUTH EVERY DAY 09/15/15   Midge Minium, MD  simvastatin (ZOCOR) 80 MG tablet TAKE 1 TABLET BY MOUTH EVERY DAY 09/15/15   Midge Minium, MD   BP 142/68 mmHg  Pulse 107  Temp(Src) 98.1 F (36.7 C) (Oral)  Resp 20  Ht 5\' 5"  (1.651 m)  Wt 216 lb (97.977 kg)  BMI 35.94 kg/m2  SpO2 96% Physical Exam  Nursing note and vitals reviewed. General: Well-developed, well-nourished female in no acute distress; appearance consistent with age of record HENT: normocephalic; atraumatic Eyes: pupils equal, round and reactive to light; extraocular muscles intact Neck: supple Heart: regular rate and rhythm Lungs: clear to auscultation bilaterally Abdomen: soft; nondistended; nontender; no masses or hepatosplenomegaly; bowel sounds present Extremities: No deformity; full range of motion; pulses normal; varicose veins of the lower legs most prominant on the left medial lower leg Neurologic: Awake, alert and oriented; motor function intact in all extremities and symmetric; no facial droop Skin: Warm and dry Psychiatric: Normal mood and affect  ED Course  Procedures (including critical care time) DIAGNOSTIC STUDIES: Oxygen Saturation is 96% on RA,  normal by my interpretation.     MDM  We'll give Xarelto and schedule patient for venous Doppler tomorrow.  Final diagnoses:  Left leg pain   I personally performed the services described in this documentation, which was scribed in my presence. The  recorded information has been reviewed and is accurate.    Shanon Rosser, MD 11/22/15 [id number]

## 2015-11-23 ENCOUNTER — Ambulatory Visit (HOSPITAL_BASED_OUTPATIENT_CLINIC_OR_DEPARTMENT_OTHER)
Admission: RE | Admit: 2015-11-23 | Discharge: 2015-11-23 | Disposition: A | Discharge status: Home / Self Care | Payer: PPO | Source: Ambulatory Visit | Attending: Emergency Medicine | Admitting: Emergency Medicine

## 2015-11-23 DIAGNOSIS — I8392 Asymptomatic varicose veins of left lower extremity: Secondary | ICD-10-CM

## 2015-11-23 DIAGNOSIS — M7989 Other specified soft tissue disorders: Secondary | ICD-10-CM

## 2015-11-23 NOTE — ED Provider Notes (Signed)
75 yo F with leg pain came into the ED last night was seen start on xarelto and had an ultrasound done to rule out DVT. Ultrasound was negative for DVT patient however has a superficial fluid collection to the anterior lateral aspect of the knee. Patient's symptoms are on the medial aspect of the leg. Associated with a varicose vein that is in that area. Patient does have a effusion clinically. No noted ligamentous laxity. Patient states that she does have some pain with rotation of that leg from time to time. Suspect that she may have a meniscal injury. Suggest that she follow-up with her family doctor for reevaluation.  Deno Etienne, DO 11/23/15 1337

## 2015-11-24 ENCOUNTER — Encounter: Payer: Self-pay | Admitting: Family Medicine

## 2015-11-24 ENCOUNTER — Ambulatory Visit: Payer: PPO | Admitting: Family Medicine

## 2015-11-24 ENCOUNTER — Ambulatory Visit (INDEPENDENT_AMBULATORY_CARE_PROVIDER_SITE_OTHER): Payer: PPO | Admitting: Family Medicine

## 2015-11-24 VITALS — BP 126/83 | HR 94 | Temp 98.5°F | Resp 16 | Wt 218.5 lb

## 2015-11-24 DIAGNOSIS — I8393 Asymptomatic varicose veins of bilateral lower extremities: Secondary | ICD-10-CM

## 2015-11-24 DIAGNOSIS — M25562 Pain in left knee: Secondary | ICD-10-CM

## 2015-11-24 DIAGNOSIS — I8390 Asymptomatic varicose veins of unspecified lower extremity: Secondary | ICD-10-CM

## 2015-11-24 MED ORDER — OXYCODONE-ACETAMINOPHEN 5-325 MG PO TABS: 1.0000 | ORAL_TABLET | Freq: Four times a day (QID) | ORAL | Status: DC | PRN

## 2015-11-24 NOTE — Progress Notes (Signed)
   Subjective:    Patient ID: Rachael Buck, female    DOB: Aug 16, 1941, 75 y.o.   MRN: [id number]  HPI L lower leg swelling- pt saw Dr Raoul Pitch on 3/24 and went to ER on 3/25.  Dr Raoul Pitch started her on Keflex and Oxycodone w/ good but temporary improvement of pain.  ER had her do venous doppler to r/o DVT.  No DVT present but fluid collection noted in anterolateral L knee.  Pt continues to have pain w/ weight bearing- mostly localized to posterior knee and radiates downward.  Pain radiates all the way to toes.  sxs started ~2 months ago but acutely worsened 1 week ago.  Pt has seen Dr Veverly Fells at [place] previously.   Review of Systems For ROS see HPI     Objective:   Physical Exam  Constitutional: She is oriented to person, place, and time. She appears well-developed and well-nourished. No distress.  Cardiovascular: Intact distal pulses.   Musculoskeletal: She exhibits edema (mild swelling of L knee w/ medial lower leg varicose veins (no redness or TTP)) and tenderness (TTP over L lateral joint line and edema superior to patella).  Neurological: She is alert and oriented to person, place, and time.  Skin: Skin is warm and dry. No erythema.  Psychiatric: She has a normal mood and affect. Her behavior is normal. Thought content normal.  Vitals reviewed.         Assessment & Plan:

## 2015-11-24 NOTE — Patient Instructions (Signed)
Follow up as needed We'll notify you of your ortho appt Take the Oxycodone as needed for pain/swelling Alternate ice/heat for pain/swelling Call with any questions or concerns Hang in there!!!

## 2015-11-24 NOTE — Progress Notes (Signed)
Pre visit review using our clinic review tool, if applicable. No additional management support is needed unless otherwise documented below in the visit note. 

## 2015-11-25 NOTE — Assessment & Plan Note (Signed)
New.  Pt w/ L knee pain and swelling.  Reviewed venous dopplers w/ pt- no evidence of DVT.  Suspect that her varicose veins are more distended due to the swelling of her knee.  Refill provided on pain medication and referral placed to ortho.  Pt expressed understanding and is in agreement w/ plan.

## 2015-11-27 DIAGNOSIS — M25462 Effusion, left knee: Secondary | ICD-10-CM | POA: Diagnosis not present

## 2015-11-27 DIAGNOSIS — M25562 Pain in left knee: Secondary | ICD-10-CM | POA: Diagnosis not present

## 2015-12-05 DIAGNOSIS — M25562 Pain in left knee: Secondary | ICD-10-CM | POA: Diagnosis not present

## 2015-12-22 ENCOUNTER — Ambulatory Visit (INDEPENDENT_AMBULATORY_CARE_PROVIDER_SITE_OTHER): Payer: PPO | Admitting: Family Medicine

## 2015-12-22 ENCOUNTER — Encounter: Payer: Self-pay | Admitting: Family Medicine

## 2015-12-22 VITALS — BP 126/82 | HR 103 | Temp 98.1°F | Resp 16 | Ht 65.0 in | Wt 215.0 lb

## 2015-12-22 DIAGNOSIS — E119 Type 2 diabetes mellitus without complications: Secondary | ICD-10-CM | POA: Diagnosis not present

## 2015-12-22 LAB — BASIC METABOLIC PANEL
BUN: 8 mg/dL (ref 6–23)
CO2: 32 mEq/L (ref 19–32)
Calcium: 9.3 mg/dL (ref 8.4–10.5)
Chloride: 93 mEq/L — ABNORMAL LOW (ref 96–112)
Creatinine, Ser: 0.5 mg/dL (ref 0.40–1.20)
GFR: 128.36 mL/min (ref >60.00)
Glucose, Bld: 153 mg/dL — ABNORMAL HIGH (ref 70–99)
Potassium: 3.5 mEq/L (ref 3.5–5.1)
Sodium: 135 mEq/L (ref 135–145)

## 2015-12-22 LAB — HEMOGLOBIN A1C: Hgb A1c MFr Bld: 7.7 % — ABNORMAL HIGH (ref 4.6–6.5)

## 2015-12-22 MED ORDER — HYDROCORTISONE 2.5 % EX CREA: 1.0000 "application " | TOPICAL_CREAM | Freq: Two times a day (BID) | CUTANEOUS | Status: DC | PRN

## 2015-12-22 NOTE — Assessment & Plan Note (Signed)
Ongoing issue for pt.  Tolerating metformin w/o difficulty.  On ARB for renal protection.  UTD on foot exam, eye exam.  Again discussed need for healthy diet and regular exercise.  Check labs.  Adjust meds prn

## 2015-12-22 NOTE — Patient Instructions (Signed)
Schedule your complete physical in 3-4 months We'll notify you of your lab results and make any changes if needed Continue to work on healthy diet and regular exercise- you look good! Call with any questions or concerns [place]!!!

## 2015-12-22 NOTE — Progress Notes (Signed)
   Subjective:    Patient ID: Rachael Buck, female    DOB: [birth year], 75 y.o.   MRN: [id number]  HPI DM- chronic problem, on Metformin twice daily.  UTD on eye exam, foot exam.  On ARB for renal protection.  Pt has lost 4-5 lbs since last visit.  Home CBGs 'aren't good'- but never over 200.  Denies symptomatic lows.  Denies CP, SOB above baseline, HAs, visual changes, edema, N/V/D, myalgias.  Denies weakness/numbness of hands/feet.   Review of Systems For ROS see HPI     Objective:   Physical Exam  Constitutional: She is oriented to person, place, and time. She appears well-developed and well-nourished. No distress.  HENT:  Head: Normocephalic and atraumatic.  Eyes: Conjunctivae and EOM are normal. Pupils are equal, round, and reactive to light.  Neck: Normal range of motion. Neck supple. No thyromegaly present.  Cardiovascular: Normal rate, regular rhythm, normal heart sounds and intact distal pulses.   No murmur heard. Pulmonary/Chest: Effort normal and breath sounds normal. No respiratory distress.  Abdominal: Soft. She exhibits no distension. There is no tenderness.  + hepatomegaly  Musculoskeletal: She exhibits no edema.  Lymphadenopathy:    She has no cervical adenopathy.  Neurological: She is alert and oriented to person, place, and time.  Skin: Skin is warm and dry.  Psychiatric: She has a normal mood and affect. Her behavior is normal.  Vitals reviewed.         Assessment & Plan:

## 2015-12-22 NOTE — Progress Notes (Signed)
Pre visit review using our clinic review tool, if applicable. No additional management support is needed unless otherwise documented below in the visit note. 

## 2015-12-24 DIAGNOSIS — M25562 Pain in left knee: Secondary | ICD-10-CM | POA: Diagnosis not present

## 2016-01-01 DIAGNOSIS — G8929 Other chronic pain: Secondary | ICD-10-CM | POA: Diagnosis not present

## 2016-01-01 DIAGNOSIS — M25562 Pain in left knee: Secondary | ICD-10-CM | POA: Diagnosis not present

## 2016-01-06 DIAGNOSIS — M23332 Other meniscus derangements, other medial meniscus, left knee: Secondary | ICD-10-CM | POA: Diagnosis not present

## 2016-01-06 DIAGNOSIS — M1712 Unilateral primary osteoarthritis, left knee: Secondary | ICD-10-CM | POA: Diagnosis not present

## 2016-01-06 DIAGNOSIS — M25562 Pain in left knee: Secondary | ICD-10-CM | POA: Diagnosis not present

## 2016-01-06 DIAGNOSIS — M23362 Other meniscus derangements, other lateral meniscus, left knee: Secondary | ICD-10-CM | POA: Diagnosis not present

## 2016-01-14 ENCOUNTER — Ambulatory Visit (INDEPENDENT_AMBULATORY_CARE_PROVIDER_SITE_OTHER): Payer: PPO | Admitting: Family Medicine

## 2016-01-14 ENCOUNTER — Encounter: Payer: Self-pay | Admitting: Family Medicine

## 2016-01-14 VITALS — BP 124/84 | HR 79 | Temp 98.0°F | Resp 16 | Ht 65.0 in | Wt 211.1 lb

## 2016-01-14 DIAGNOSIS — Z01818 Encounter for other preprocedural examination: Secondary | ICD-10-CM

## 2016-01-14 LAB — CBC WITH DIFFERENTIAL/PLATELET
Basophils Absolute: 0 10*3/uL (ref 0.0–0.1)
Basophils Relative: 0.4 % (ref 0.0–3.0)
Eosinophils Absolute: 0 10*3/uL (ref 0.0–0.7)
Eosinophils Relative: 0.4 % (ref 0.0–5.0)
HCT: 30.5 % — ABNORMAL LOW (ref 36.0–46.0)
Hemoglobin: 9.8 g/dL — ABNORMAL LOW (ref 12.0–15.0)
Lymphocytes Relative: 26.8 % (ref 12.0–46.0)
Lymphs Abs: 1.8 10*3/uL (ref 0.7–4.0)
MCHC: 32.2 g/dL (ref 30.0–36.0)
MCV: 75.1 fl — ABNORMAL LOW (ref 78.0–100.0)
Monocytes Absolute: 0.5 10*3/uL (ref 0.1–1.0)
Monocytes Relative: 6.7 % (ref 3.0–12.0)
Neutro Abs: 4.4 10*3/uL (ref 1.4–7.7)
Neutrophils Relative %: 65.7 % (ref 43.0–77.0)
Platelets: 107 10*3/uL — ABNORMAL LOW (ref 150.0–400.0)
RBC: 4.06 Mil/uL (ref 3.87–5.11)
RDW: 18.1 % — ABNORMAL HIGH (ref 11.5–15.5)
WBC: 6.8 10*3/uL (ref 4.0–10.5)

## 2016-01-14 LAB — HEPATIC FUNCTION PANEL
ALT: 16 U/L (ref 0–35)
AST: 25 U/L (ref 0–37)
Albumin: 4.3 g/dL (ref 3.5–5.2)
Alkaline Phosphatase: 54 U/L (ref 39–117)
Bilirubin, Direct: 0.1 mg/dL (ref 0.0–0.3)
Total Bilirubin: 0.7 mg/dL (ref 0.2–1.2)
Total Protein: 7.4 g/dL (ref 6.0–8.3)

## 2016-01-14 LAB — TSH: TSH: 2.07 u[IU]/mL (ref 0.35–4.50)

## 2016-01-14 LAB — BASIC METABOLIC PANEL
BUN: 8 mg/dL (ref 6–23)
CO2: 30 mEq/L (ref 19–32)
Calcium: 9.4 mg/dL (ref 8.4–10.5)
Chloride: 95 mEq/L — ABNORMAL LOW (ref 96–112)
Creatinine, Ser: 0.48 mg/dL (ref 0.40–1.20)
GFR: 134.53 mL/min (ref >60.00)
Glucose, Bld: 131 mg/dL — ABNORMAL HIGH (ref 70–99)
Potassium: 3.5 mEq/L (ref 3.5–5.1)
Sodium: 135 mEq/L (ref 135–145)

## 2016-01-14 LAB — PROTIME-INR
INR: 1.2 ratio — ABNORMAL HIGH (ref 0.8–1.0)
Prothrombin Time: 12.5 s (ref 9.6–13.1)

## 2016-01-14 NOTE — Progress Notes (Signed)
Subjective:    Rachael Buck is a 75 y.o. female who presents to the office today for a preoperative consultation at the request of surgeon Dr Veverly Fells who plans on performing L knee scope on TBD  . This consultation is requested for the specific conditions prompting preoperative evaluation (i.e. because of potential affect on operative risk): NASH, DM. Planned anesthesia: general. The patient has the following known anesthesia issues: no hx of anesthesia complications. Patients bleeding risk: no recent abnormal bleeding and no remote history of abnormal bleeding. Patient does not have objections to receiving blood products if needed.  The following portions of the patient's history were reviewed and updated as appropriate: allergies, current medications, past family history, past medical history, past social history, past surgical history and problem list.  Review of Systems A comprehensive review of systems was negative except for: GERD    Objective:    BP 124/84 mmHg  Pulse 79  Temp(Src) 98 F (36.7 C) (Oral)  Resp 16  Ht 5\' 5"  (1.651 m)  Wt 211 lb 2 oz (95.766 kg)  BMI 35.13 kg/m2  SpO2 99%  General Appearance:    Alert, cooperative, no distress, appears stated age  Head:    Normocephalic, without obvious abnormality, atraumatic  Eyes:    PERRL, conjunctiva/corneas clear, EOM's intact, fundi    benign, both eyes  Ears:    Normal TM's and external ear canals, both ears  Nose:   Nares normal, septum midline, mucosa normal, no drainage    or sinus tenderness  Throat:   Lips, mucosa, and tongue normal; teeth and gums normal  Neck:   Supple, symmetrical, trachea midline, no adenopathy;    thyroid:  no enlargement/tenderness/nodules  Back:     Symmetric, no curvature, ROM normal, no CVA tenderness  Lungs:     Clear to auscultation bilaterally, respirations unlabored  Chest Wall:    No tenderness or deformity   Heart:    Regular rate and rhythm, S1 and S2 normal, no murmur, rub    or gallop  Breast Exam:    No tenderness, masses, or nipple abnormality  Abdomen:     Soft, non-tender, bowel sounds active all four quadrants,    no masses, + hepatomegaly  Genitalia:    deferred  Rectal:    Extremities:   Extremities normal, atraumatic, no cyanosis or edema  Pulses:   2+ and symmetric all extremities  Skin:   Skin color, texture, turgor normal, no rashes or lesions  Lymph nodes:   Cervical, supraclavicular, and axillary nodes normal  Neurologic:   CNII-XII intact, normal strength, sensation and reflexes    throughout    Predictors of intubation difficulty:  Morbid obesity? no  Anatomically abnormal facies? no  Prominent incisors? no  Receding mandible? no  Short, thick neck? no  Neck range of motion: normal  Dentition: No chipped, loose, or missing teeth.  Cardiographics ECG: sinus rhythm w/ pause Echocardiogram: not done  Imaging Chest x-ray: NA   Lab Review  Office Visit on 12/22/2015  Component Date Value  . HM Mammogram 03/31/2015 Self Reported Normal   . Sodium 12/22/2015 135   . Potassium 12/22/2015 3.5   . Chloride 12/22/2015 93*  . CO2 12/22/2015 32   . Glucose, Bld 12/22/2015 153*  . BUN 12/22/2015 8   . Creatinine, Ser 12/22/2015 0.50   . Calcium 12/22/2015 9.3   . GFR 12/22/2015 128.36   . Hgb A1c MFr Bld 12/22/2015 7.7*  Assessment:      75 y.o. female with planned surgery as above.   Known risk factors for perioperative complications: Diabetes mellitus Hepatic dysfunction   Difficulty with intubation is not anticipated.  Cardiac Risk Estimation: low  Current medications which may produce withdrawal symptoms if withheld perioperatively: none      Plan:    1. Preoperative workup as follows ECG, hemoglobin, hematocrit, electrolytes, creatinine, glucose, liver function studies, coagulation studies. 2. Change in medication regimen before surgery: discontinue NSAIDs (OTC Advil/Aleve) 14 days before surgery and discontinue  Metformin 24 hours before surgery. 3. Prophylaxis for cardiac events with perioperative beta-blockers: should be considered, specific regimen per anesthesia. 4. Invasive hemodynamic monitoring perioperatively: at the discretion of anesthesiologist. 5. Deep vein thrombosis prophylaxis postoperatively:regimen to be chosen by surgical team. 6. Surveillance for postoperative MI with ECG immediately postoperatively and on postoperative days 1 and 2 AND troponin levels 24 hours postoperatively and on day 4 or hospital discharge (whichever comes first): at the discretion of anesthesiologist. 7. Other measures: none

## 2016-01-14 NOTE — Progress Notes (Signed)
Pre visit review using our clinic review tool, if applicable. No additional management support is needed unless otherwise documented below in the visit note. 

## 2016-01-14 NOTE — Patient Instructions (Signed)
Follow up as scheduled We'll notify you of your lab results and make any changes if needed From our standpoint, you are good to go for surgery!! Call with any questions or concerns Good Luck with surgery!!!

## 2016-01-28 DIAGNOSIS — M23332 Other meniscus derangements, other medial meniscus, left knee: Secondary | ICD-10-CM | POA: Diagnosis not present

## 2016-01-28 DIAGNOSIS — S83282A Other tear of lateral meniscus, current injury, left knee, initial encounter: Secondary | ICD-10-CM | POA: Diagnosis not present

## 2016-01-28 DIAGNOSIS — M23362 Other meniscus derangements, other lateral meniscus, left knee: Secondary | ICD-10-CM | POA: Diagnosis not present

## 2016-01-28 DIAGNOSIS — Y939 Activity, unspecified: Secondary | ICD-10-CM | POA: Diagnosis not present

## 2016-01-28 DIAGNOSIS — S83232A Complex tear of medial meniscus, current injury, left knee, initial encounter: Secondary | ICD-10-CM | POA: Diagnosis not present

## 2016-01-28 DIAGNOSIS — Y999 Unspecified external cause status: Secondary | ICD-10-CM | POA: Diagnosis not present

## 2016-01-28 DIAGNOSIS — M94262 Chondromalacia, left knee: Secondary | ICD-10-CM | POA: Diagnosis not present

## 2016-01-28 DIAGNOSIS — G8918 Other acute postprocedural pain: Secondary | ICD-10-CM | POA: Diagnosis not present

## 2016-01-28 DIAGNOSIS — Y929 Unspecified place or not applicable: Secondary | ICD-10-CM | POA: Diagnosis not present

## 2016-02-18 ENCOUNTER — Other Ambulatory Visit: Payer: Self-pay | Admitting: General Practice

## 2016-02-18 MED ORDER — DICYCLOMINE HCL 20 MG PO TABS: 20.0000 mg | ORAL_TABLET | Freq: Three times a day (TID) | ORAL | Status: DC | PRN

## 2016-03-08 ENCOUNTER — Other Ambulatory Visit: Payer: Self-pay | Admitting: Family Medicine

## 2016-03-08 NOTE — Telephone Encounter (Signed)
Medication filled to pharmacy as requested.   

## 2016-03-08 NOTE — Telephone Encounter (Signed)
Last OV 01/14/16 Alprazolam last filled 10/27/15 #90 with 3

## 2016-03-30 ENCOUNTER — Other Ambulatory Visit: Payer: Self-pay | Admitting: Family Medicine

## 2016-04-14 ENCOUNTER — Other Ambulatory Visit: Payer: Self-pay | Admitting: Family Medicine

## 2016-04-14 NOTE — Telephone Encounter (Signed)
Last OV 01/14/16 Alprazolam last filled 03/08/16 #90 with 0

## 2016-04-14 NOTE — Telephone Encounter (Signed)
Medication filled to pharmacy as requested.   

## 2016-04-15 ENCOUNTER — Ambulatory Visit (INDEPENDENT_AMBULATORY_CARE_PROVIDER_SITE_OTHER): Payer: PPO | Admitting: Family Medicine

## 2016-04-15 ENCOUNTER — Encounter: Payer: Self-pay | Admitting: Family Medicine

## 2016-04-15 VITALS — BP 132/86 | HR 89 | Temp 98.0°F | Resp 16 | Ht 65.0 in | Wt 208.6 lb

## 2016-04-15 DIAGNOSIS — E782 Mixed hyperlipidemia: Secondary | ICD-10-CM

## 2016-04-15 DIAGNOSIS — I10 Essential (primary) hypertension: Secondary | ICD-10-CM | POA: Diagnosis not present

## 2016-04-15 DIAGNOSIS — R7989 Other specified abnormal findings of blood chemistry: Secondary | ICD-10-CM | POA: Diagnosis not present

## 2016-04-15 DIAGNOSIS — E119 Type 2 diabetes mellitus without complications: Secondary | ICD-10-CM

## 2016-04-15 LAB — CBC WITH DIFFERENTIAL/PLATELET
Basophils Absolute: 0 10*3/uL (ref 0.0–0.1)
Basophils Relative: 0.4 % (ref 0.0–3.0)
Eosinophils Absolute: 0 10*3/uL (ref 0.0–0.7)
Eosinophils Relative: 0.6 % (ref 0.0–5.0)
HCT: 29.5 % — ABNORMAL LOW (ref 36.0–46.0)
Hemoglobin: 9.6 g/dL — ABNORMAL LOW (ref 12.0–15.0)
Lymphocytes Relative: 28 % (ref 12.0–46.0)
Lymphs Abs: 2 10*3/uL (ref 0.7–4.0)
MCHC: 32.6 g/dL (ref 30.0–36.0)
MCV: 74 fl — ABNORMAL LOW (ref 78.0–100.0)
Monocytes Absolute: 0.6 10*3/uL (ref 0.1–1.0)
Monocytes Relative: 7.8 % (ref 3.0–12.0)
Neutro Abs: 4.5 10*3/uL (ref 1.4–7.7)
Neutrophils Relative %: 63.2 % (ref 43.0–77.0)
Platelets: 126 10*3/uL — ABNORMAL LOW (ref 150.0–400.0)
RBC: 3.98 Mil/uL (ref 3.87–5.11)
RDW: 17.8 % — ABNORMAL HIGH (ref 11.5–15.5)
WBC: 7.1 10*3/uL (ref 4.0–10.5)

## 2016-04-15 LAB — BASIC METABOLIC PANEL
BUN: 9 mg/dL (ref 6–23)
CO2: 29 mEq/L (ref 19–32)
Calcium: 9.3 mg/dL (ref 8.4–10.5)
Chloride: 95 mEq/L — ABNORMAL LOW (ref 96–112)
Creatinine, Ser: 0.56 mg/dL (ref 0.40–1.20)
GFR: 112.53 mL/min (ref >60.00)
Glucose, Bld: 149 mg/dL — ABNORMAL HIGH (ref 70–99)
Potassium: 3.3 mEq/L — ABNORMAL LOW (ref 3.5–5.1)
Sodium: 136 mEq/L (ref 135–145)

## 2016-04-15 LAB — LIPID PANEL
Cholesterol: 118 mg/dL (ref 0–200)
HDL: 48.8 mg/dL (ref >39.00)
NonHDL: 69.65
Total CHOL/HDL Ratio: 2
Triglycerides: 292 mg/dL — ABNORMAL HIGH (ref 0.0–149.0)
VLDL: 58.4 mg/dL — ABNORMAL HIGH (ref 0.0–40.0)

## 2016-04-15 LAB — HEPATIC FUNCTION PANEL
ALT: 19 U/L (ref 0–35)
AST: 29 U/L (ref 0–37)
Albumin: 4.2 g/dL (ref 3.5–5.2)
Alkaline Phosphatase: 58 U/L (ref 39–117)
Bilirubin, Direct: 0.1 mg/dL (ref 0.0–0.3)
Total Bilirubin: 0.6 mg/dL (ref 0.2–1.2)
Total Protein: 7.1 g/dL (ref 6.0–8.3)

## 2016-04-15 LAB — TSH: TSH: 1.87 u[IU]/mL (ref 0.35–4.50)

## 2016-04-15 LAB — LDL CHOLESTEROL, DIRECT: Direct LDL: 50 mg/dL

## 2016-04-15 LAB — HEMOGLOBIN A1C: Hgb A1c MFr Bld: 7.4 % — ABNORMAL HIGH (ref 4.6–6.5)

## 2016-04-15 NOTE — Assessment & Plan Note (Signed)
Chronic problem.  On daily statin.  Encouraged healthy diet and regular exercise.  Check labs.  Adjust meds prn

## 2016-04-15 NOTE — Assessment & Plan Note (Signed)
Chronic problem.  Pt feels a lot of her current sxs are related to her metformin.  Is asking to decrease the dose to see if her GI sxs and fatigue improve.  There is likely a deconditioning component to her fatigue as well.  Decrease Metformin to 1/2 tab BID and add additional tx prn.  Encouraged her to schedule eye exam.  Will follow closely.

## 2016-04-15 NOTE — Progress Notes (Signed)
   Subjective:    Patient ID: Rachael Buck, female    DOB: August 25, 1941, 75 y.o.   MRN: [id number]  HPI HTN- chronic problem.  Adequate control on HCTZ, Losartan.  No CP, + chronic SOB.  No HAs, visual changes.  Hyperlipidemia- chronic proble,on Simvastatin daily.  +nausea, no vomiting, no abd pain.  DM- chronic problem, on Metformin.  On ARB for renal protection.  UTD on foot exam.  Due for eye exam this month- pt plans to schedule.  'i'm just not feeling good for a couple of months'.  Pt reports nausea, fatigue, diarrhea- which she feels are due to her Metformin.  Denies symptomatic lows.   Review of Systems For ROS see HPI     Objective:   Physical Exam  Constitutional: She is oriented to person, place, and time. She appears well-developed and well-nourished. No distress.  obese  HENT:  Head: Normocephalic and atraumatic.  Eyes: Conjunctivae and EOM are normal. Pupils are equal, round, and reactive to light.  Neck: Normal range of motion. Neck supple. No thyromegaly present.  Cardiovascular: Normal rate, regular rhythm, normal heart sounds and intact distal pulses.   No murmur heard. Pulmonary/Chest: Effort normal and breath sounds normal. No respiratory distress.  Abdominal: Soft. She exhibits no distension. There is no tenderness.  + hepatomegaly  Musculoskeletal: She exhibits no edema.  Lymphadenopathy:    She has no cervical adenopathy.  Neurological: She is alert and oriented to person, place, and time.  Skin: Skin is warm and dry.  Psychiatric: She has a normal mood and affect. Her behavior is normal.  Vitals reviewed.         Assessment & Plan:

## 2016-04-15 NOTE — Progress Notes (Signed)
Pre visit review using our clinic review tool, if applicable. No additional management support is needed unless otherwise documented below in the visit note. 

## 2016-04-15 NOTE — Patient Instructions (Signed)
Schedule your complete physical in 3-4 months We'll notify you of your lab results and make any changes if needed Decrease the Metformin to 1/2 tab twice daily and see if some of the GI symptoms improve Call and schedule your eye exam Continue to work on healthy diet and regular exercise- you can do it! Call with any questions or concerns Enjoy the rest of your summer!!!

## 2016-04-15 NOTE — Assessment & Plan Note (Signed)
Chronic problem.  Currently well controlled.  Check labs.  No anticipated med changes. 

## 2016-04-16 ENCOUNTER — Other Ambulatory Visit: Payer: Self-pay | Admitting: General Practice

## 2016-04-16 MED ORDER — FERROUS SULFATE 325 (65 FE) MG PO TABS
325.0000 mg | ORAL_TABLET | Freq: Every day | ORAL | 1 refills | Status: DC
Start: 2016-04-16 — End: 2017-01-28

## 2016-04-16 MED ORDER — FENOFIBRATE 160 MG PO TABS: 160.0000 mg | ORAL_TABLET | Freq: Every day | ORAL | 1 refills | Status: DC

## 2016-04-16 MED ORDER — POTASSIUM CHLORIDE CRYS ER 20 MEQ PO TBCR: 20.0000 meq | EXTENDED_RELEASE_TABLET | Freq: Every day | ORAL | 1 refills | Status: DC

## 2016-06-08 ENCOUNTER — Ambulatory Visit: Payer: PPO

## 2016-06-08 NOTE — Progress Notes (Deleted)
Subjective:   Rachael Buck is a 75 y.o. female who presents for Medicare Annual (Subsequent) preventive examination.     Medicare Annual Preventive Care Visit  (Subsequent annual wellness)  Describes Health as poor, fair, good or great?   VS reviewed;   BMI Diet  Exercise  Dental   1.) Patient-completed health risk assessment during this assessment - completed and reviewed, see scanned documentation negative for report of fevers, unintentional weight loss, vision changes, vision loss, hearing loss or change,  Falls, depression,  thoughts of suicide or self harm, memory loss    2.) Review of Medical History: -PMH, PSH, Family History and current specialty and care providers reviewed and updated and listed below  - see scanned in document in chart and below Obesity HTN/ Hyperlipidemia - 03/2016; ratio 2; HDL 48; LDL 50;  DM: A1c 7.4 from 7.7 Asthma  Social History   Social History  . Marital status: Married    Spouse name: N/A  . Number of children: N/A  . Years of education: N/A   Occupational History  . Not on file.   Social History Main Topics  . Smoking status: Former Research scientist (life sciences)  . Smokeless tobacco: Never Used  . Alcohol use No  . Drug use: No  . Sexual activity: Not on file   Other Topics Concern  . Not on file   Social History Narrative  . No narrative on file    Family History  Problem Relation Age of Onset  . Breast cancer Daughter   . Breast cancer Maternal Aunt   . Diabetes Brother   . Diabetes Sister   . Stomach cancer Mother   . Cancer Other   . GER disease Other   . Obesity Other    Former smoker;   ETOH; updated and no ETOH   3.) Review of functional ability and level of safety:  Any vision issues? DUE due to DM  Last eye exam 03/2015  Any difficulty hearing?  NO  History of falling?  NO  Any trouble with IADLs - using a phone, using transportation, grocery shopping, preparing meals, doing housework, doing laundry,  taking medications and managing money? NO  Stressor?   Advance Directives? NO  See summary of recommendations in Patient Instructions below.   EKG (optional): 12/2015  Goal for better health or other ?    General: alert, appear well hydrated and in no acute distress   Mood stable; attentive;   See patient instructions for recommendations.  The following written screening schedule of preventive measures were reviewed with assessment and plan made per below, orders and patient instructions:      AAA screening done if applicable     Alcohol screening done     Obesity Screening and counseling done     STI screening (Hep C if born [birth year]) offered and per pt wishes     Tobacco Screening done     Vaccinations:  ASSESSMENT/PLAN: Zostavax Educated to check with insurance regarding coverage of Shingles vaccination on Part D or Part B and may have lower co-pay if provided on the Part D side  Flu vaccine due       Screening mammograph (yearly if >40) 03/2015 ASSESSMENT/PLAN: DUE      Screening Pap smear/pelvic exam (q2 years) ASSESSMENT/PLAN: n/a, declined    Colorectal cancer screening: colonoscopy q 5 years;  colo-guard reviewed  And completed 03/31/2015 ASSESSMENT/PLAN: aged out       Diabetes outpatient self-management training services ASSESSMENT/PLAN:  managed on metformin     Bone mass measurements(covered q2y  04/2006 - normal  ASSESSMENT/PLAN: utd or discussed and ordered per pt wishes/       Screening for glaucoma(q1y if high risk - diabetes, FH, AA and > 50 or hispanic and > 65) ASSESSMENT/PLAN: utd or advised      Medical nutritional therapy for individuals with diabetes or renal disease ASSESSMENT/PLAN: see orders      Cardiovascular screening blood tests (lipids q5y) ASSESSMENT/PLAN: completed this year; ratio 2 cho/hdl      Diabetes screening tests ASSESSMENT/PLAN: managed on metformin;  Discussed goals    7.) Summary: -risk factors and  conditions per above assessment were discussed and treatment, recommendations and referrals were offered per documentation above and orders and patient instructions.  Seen by dr. Audie Pinto;  Will elect MD from this practice and make apt when leaving.     There are no Patient Instructions on file for this visit.  [id number], RN       Objective:     Vitals: There were no vitals taken for this visit.  There is no height or weight on file to calculate BMI.   Tobacco History  Smoking Status  . Former Smoker  Smokeless Tobacco  . Never Used     Counseling given: Not Answered   Past Medical History:  Diagnosis Date  . Arthritis   . Asthma   . Cirrhosis, nonalcoholic ([place])   . Colon polyps   . Diabetes mellitus   . Diverticulitis   . GERD (gastroesophageal reflux disease)   . Hyperlipidemia   . Hypertension   . NASH (nonalcoholic steatohepatitis)   . Obesity    Past Surgical History:  Procedure Laterality Date  . CHOLECYSTECTOMY  1981  . OOPHORECTOMY  1973  . PARTIAL HYSTERECTOMY  1972  . ROTATOR CUFF REPAIR     bilateral. 2006, 2008  . VEIN SURGERY     Family History  Problem Relation Age of Onset  . Breast cancer Daughter   . Breast cancer Maternal Aunt   . Diabetes Brother   . Diabetes Sister   . Stomach cancer Mother   . Cancer Other   . GER disease Other   . Obesity Other    History  Sexual Activity  . Sexual activity: Not on file    Outpatient Encounter Prescriptions as of 06/08/2016  Medication Sig  . albuterol (PROAIR HFA) 108 (90 BASE) MCG/ACT inhaler Inhale 2 puffs into the lungs every 4 (four) hours as needed for wheezing or shortness of breath.  . ALPRAZolam (XANAX) 0.5 MG tablet TAKE 1 TABLET BY MOUTH THREE TIMES DAILY AS NEEDED FOR ANXIETY OR PAIN  . beclomethasone (QVAR) 40 MCG/ACT inhaler Inhale 2 puffs into the lungs 2 (two) times daily.  . colestipol (COLESTID) 1 g tablet TK 1-2 TS PO QD  . dicyclomine (BENTYL) 20 MG tablet Take 1  tablet (20 mg total) by mouth 3 (three) times daily as needed.  . fenofibrate 160 MG tablet Take 1 tablet (160 mg total) by mouth daily.  . ferrous sulfate 325 (65 FE) MG tablet Take 1 tablet (325 mg total) by mouth daily with breakfast.  . fluticasone (FLONASE) 50 MCG/ACT nasal spray Place 2 sprays into both nostrils daily.  Marland Kitchen glucose blood (ONE TOUCH ULTRA TEST) test strip Use as instructed  . hydrochlorothiazide (HYDRODIURIL) 25 MG tablet TAKE 1 TABLET BY MOUTH EVERY DAY  . hydrocortisone 2.5 % cream Apply 1 application topically 2 (two) times  daily as needed (for eczema).  . hyoscyamine (LEVSIN, ANASPAZ) 0.125 MG tablet Take 1 tablet (0.125 mg total) by mouth every 4 (four) hours as needed for cramping.  . Loperamide HCl (IMODIUM PO) Take 3 tablets by mouth as needed.   Marland Kitchen losartan (COZAAR) 50 MG tablet TAKE 1 TABLET BY MOUTH EVERY DAY  . metFORMIN (GLUCOPHAGE) 1000 MG tablet TAKE 1 TABLET BY MOUTH TWICE DAILY WITH MEALS  . omeprazole (PRILOSEC) 40 MG capsule TAKE 1 CAPSULE BY MOUTH EVERY DAY  . ONETOUCH DELICA LANCETS FINE MISC 1 Stick by Does not apply route 2 (two) times daily.  Marland Kitchen oxyCODONE-acetaminophen (ROXICET) 5-325 MG tablet Take 1 tablet by mouth every 6 (six) hours as needed for severe pain.  . potassium chloride SA (K-DUR,KLOR-CON) 20 MEQ tablet Take 1 tablet (20 mEq total) by mouth daily.  . sertraline (ZOLOFT) 50 MG tablet TAKE 1 TABLET BY MOUTH EVERY DAY  . simvastatin (ZOCOR) 80 MG tablet TAKE 1 TABLET BY MOUTH EVERY DAY   No facility-administered encounter medications on file as of 06/08/2016.     Activities of Daily Living In your present state of health, do you have any difficulty performing the following activities: 09/16/2015  Hearing? Y  Vision? N  Difficulty concentrating or making decisions? N  Walking or climbing stairs? N  Dressing or bathing? N  Doing errands, shopping? N  Some recent data might be hidden    Patient Care Team: Midge Minium, MD as PCP  - General (Family Medicine) Clarene Essex, MD as Consulting Physician (Gastroenterology) Melissa Noon, OD as Referring Physician (Optometry)    Assessment:    Education and counseling regarding the above review of health provided with a plan for the following: -see scanned patient completed form for further details -fall prevention strategies discussed  -personal and community safety -healthy lifestyle discussed (weight loss, exercise, etc_  -importance and resources for completing advanced directives discussed -see patient instructions below for any other recommendations provided  Ad8 score reviewed for issues:  Issues making decisions:  Less interest in hobbies / activities:  Repeats questions, stories (family complaining):  Trouble using ordinary gadgets (microwave, computer, phone):  Forgets the month or year:   Mismanaging finances:   Remembering appts:  Daily problems with thinking and/or memory: Ad8 score is=    Exercise Activities and Dietary recommendations    Goals    None     Fall Risk Fall Risk  09/16/2015 02/11/2015 01/10/2014  Falls in the past year? No No No   Depression Screen PHQ 2/9 Scores 09/16/2015 02/11/2015 01/10/2014  PHQ - 2 Score 0 0 0  Exception Documentation Patient refusal - -     Cognitive Testing No flowsheet data found.  Immunization History  Administered Date(s) Administered  . Hepatitis B 08/10/2012, 09/11/2012, 11/23/2012  . Influenza Whole 05/30/2010  . Influenza, High Dose Seasonal PF 05/13/2015  . Pneumococcal Conjugate-13 02/11/2015  . Pneumococcal Polysaccharide-23 05/30/2008  . Td 08/31/2007   Screening Tests Health Maintenance  Topic Date Due  . ZOSTAVAX  06/15/2002  . INFLUENZA VACCINE  03/30/2016  . MAMMOGRAM  03/30/2016  . OPHTHALMOLOGY EXAM  04/02/2016  . FOOT EXAM  09/15/2016  . HEMOGLOBIN A1C  10/16/2016  . TETANUS/TDAP  08/30/2017  . COLONOSCOPY  03/30/2020  . DEXA SCAN  Completed  . PNA vac Low Risk  Adult  Completed      Plan:   *** During the course of the visit the patient was educated and counseled about the  following appropriate screening and preventive services:   Vaccines to include Pneumoccal, Influenza, Hepatitis B, Td, Zostavax, HCV  Electrocardiogram  Cardiovascular Disease  Colorectal cancer screening  Bone density screening  Diabetes screening  Glaucoma screening  Mammography/PAP  Nutrition counseling   Patient Instructions (the written plan) was given to the patient.   [id number], RN  06/08/2016

## 2016-06-18 ENCOUNTER — Other Ambulatory Visit: Payer: Self-pay | Admitting: Family Medicine

## 2016-06-18 NOTE — Telephone Encounter (Signed)
Medication filled to pharmacy as requested.   

## 2016-06-18 NOTE — Telephone Encounter (Signed)
Last OV 04/15/16 Alprazolam last filled 04/14/16 #90 with 1

## 2016-06-21 ENCOUNTER — Encounter: Payer: Self-pay | Admitting: Family Medicine

## 2016-06-21 ENCOUNTER — Ambulatory Visit (INDEPENDENT_AMBULATORY_CARE_PROVIDER_SITE_OTHER): Payer: PPO | Admitting: Family Medicine

## 2016-06-21 VITALS — BP 122/70 | HR 92 | Temp 98.2°F | Resp 16 | Ht 65.0 in | Wt 207.5 lb

## 2016-06-21 DIAGNOSIS — R351 Nocturia: Secondary | ICD-10-CM

## 2016-06-21 DIAGNOSIS — R81 Glycosuria: Secondary | ICD-10-CM | POA: Diagnosis not present

## 2016-06-21 DIAGNOSIS — R300 Dysuria: Secondary | ICD-10-CM

## 2016-06-21 LAB — POCT URINALYSIS DIPSTICK
Bilirubin, UA: NEGATIVE
Blood, UA: NEGATIVE
Clarity, UA: NEGATIVE
Ketones, UA: NEGATIVE
Leukocytes, UA: NEGATIVE
Nitrite, UA: NEGATIVE
Protein, UA: NEGATIVE
Spec Grav, UA: 1.01
Urobilinogen, UA: 0.2
pH, UA: 7.5

## 2016-06-21 MED ORDER — CEPHALEXIN 500 MG PO CAPS: 500.0000 mg | ORAL_CAPSULE | Freq: Two times a day (BID) | ORAL | 0 refills | Status: AC

## 2016-06-21 NOTE — Progress Notes (Signed)
   Subjective:    Patient ID: Rachael Buck, female    DOB: July 25, 1941, 75 y.o.   MRN: [id number]  HPI ? UTI- sxs started 4-5 days ago.  Pt reports home CBGs have been ~140s.  + dysuria, 'burning, just burning'.  + cloudy appearance.  No blood.  Increased frequency.  Now waking 2-3x/night to urinate.  No fevers.  + fatigue.  + suprapubic pressure.   Review of Systems For ROS see HPI     Objective:   Physical Exam  Constitutional: She is oriented to person, place, and time. She appears well-developed and well-nourished. No distress.  Abdominal: Soft. She exhibits no distension. There is tenderness (+ suprapubic but no CVA tenderness ).  Neurological: She is alert and oriented to person, place, and time.  Skin: Skin is warm and dry.  Psychiatric: She has a normal mood and affect. Her behavior is normal. Thought content normal.  Vitals reviewed.         Assessment & Plan:  UTI- despite normal UA w/ exception of glucose pt's sxs are highly suspicious for UTI.  Start keflex.  Await culture results.  Reviewed supportive care and red flags that should prompt return.  Pt expressed understanding and is in agreement w/ plan.

## 2016-06-21 NOTE — Patient Instructions (Signed)
Follow up as needed Start the Keflex twice daily- take w/ food Drink plenty of fluids Call with any questions or concerns Happy [birth year]ated Birthday!!!

## 2016-06-21 NOTE — Progress Notes (Signed)
Pre visit review using our clinic review tool, if applicable. No additional management support is needed unless otherwise documented below in the visit note. 

## 2016-06-24 LAB — URINE CULTURE

## 2016-07-01 ENCOUNTER — Other Ambulatory Visit: Payer: Self-pay | Admitting: Family Medicine

## 2016-07-06 DIAGNOSIS — L720 Epidermal cyst: Secondary | ICD-10-CM | POA: Diagnosis not present

## 2016-07-29 ENCOUNTER — Other Ambulatory Visit: Payer: Self-pay | Admitting: Family Medicine

## 2016-07-29 NOTE — Telephone Encounter (Signed)
Last OV 06/21/16 Alprazolam last filled 06/18/16 #90 with 0  Low Risk

## 2016-07-29 NOTE — Telephone Encounter (Signed)
Medication filled to pharmacy as requested.   

## 2016-08-19 ENCOUNTER — Ambulatory Visit (INDEPENDENT_AMBULATORY_CARE_PROVIDER_SITE_OTHER): Payer: PPO | Admitting: Family Medicine

## 2016-08-19 ENCOUNTER — Encounter: Payer: Self-pay | Admitting: Family Medicine

## 2016-08-19 VITALS — BP 131/73 | HR 83 | Temp 98.0°F | Resp 16 | Ht 65.0 in | Wt 208.2 lb

## 2016-08-19 DIAGNOSIS — J0100 Acute maxillary sinusitis, unspecified: Secondary | ICD-10-CM | POA: Diagnosis not present

## 2016-08-19 MED ORDER — AMOXICILLIN 875 MG PO TABS: 875.0000 mg | ORAL_TABLET | Freq: Two times a day (BID) | ORAL | 0 refills | Status: DC

## 2016-08-19 NOTE — Progress Notes (Signed)
Pre visit review using our clinic review tool, if applicable. No additional management support is needed unless otherwise documented below in the visit note. 

## 2016-08-19 NOTE — Progress Notes (Signed)
   Subjective:    Patient ID: Rachael Buck, female    DOB: 08-28-1941, 75 y.o.   MRN: [id number]  HPI URI- sxs started ~3 week ago.  She thought things were improving but then yesterday again w/ nasal congestion, sinus pressure.  Minimal HA.  No fevers.  + tooth pain.  No known sick contacts.  + nausea, no vomiting.  No ear pain.   Review of Systems For ROS see HPI     Objective:   Physical Exam  Constitutional: She appears well-developed and well-nourished. No distress.  HENT:  Head: Normocephalic and atraumatic.  Right Ear: Tympanic membrane normal.  Left Ear: Tympanic membrane normal.  Nose: Mucosal edema and rhinorrhea present. Right sinus exhibits maxillary sinus tenderness. Right sinus exhibits no frontal sinus tenderness. Left sinus exhibits maxillary sinus tenderness. Left sinus exhibits no frontal sinus tenderness.  Mouth/Throat: Uvula is midline and mucous membranes are normal. Posterior oropharyngeal erythema present. No oropharyngeal exudate.  Eyes: Conjunctivae and EOM are normal. Pupils are equal, round, and reactive to light.  Neck: Normal range of motion. Neck supple.  Cardiovascular: Normal rate, regular rhythm and normal heart sounds.   Pulmonary/Chest: Effort normal and breath sounds normal. No respiratory distress. She has no wheezes.  Lymphadenopathy:    She has no cervical adenopathy.  Vitals reviewed.         Assessment & Plan:  Maxillary sinusitis- pts sxs and PE consistent w/ infxn.  Start abx.  Reviewed supportive care and red flags that should prompt return.  Pt expressed understanding and is in agreement w/ plan.

## 2016-08-19 NOTE — Patient Instructions (Signed)
Follow up as needed/scheduled Start the Amoxicillin twice daily- take w/ food Drink plenty of fluids REST! Call with any questions or concerns Hang in there!!! Happy Holidays!!!

## 2016-08-24 ENCOUNTER — Ambulatory Visit (INDEPENDENT_AMBULATORY_CARE_PROVIDER_SITE_OTHER): Payer: PPO | Admitting: Family Medicine

## 2016-08-24 ENCOUNTER — Encounter: Payer: Self-pay | Admitting: Family Medicine

## 2016-08-24 VITALS — BP 133/63 | HR 68 | Temp 98.0°F | Resp 17 | Ht 65.0 in | Wt 208.0 lb

## 2016-08-24 DIAGNOSIS — E782 Mixed hyperlipidemia: Secondary | ICD-10-CM | POA: Diagnosis not present

## 2016-08-24 DIAGNOSIS — Z780 Asymptomatic menopausal state: Secondary | ICD-10-CM

## 2016-08-24 DIAGNOSIS — Z23 Encounter for immunization: Secondary | ICD-10-CM

## 2016-08-24 DIAGNOSIS — I10 Essential (primary) hypertension: Secondary | ICD-10-CM | POA: Diagnosis not present

## 2016-08-24 DIAGNOSIS — E119 Type 2 diabetes mellitus without complications: Secondary | ICD-10-CM

## 2016-08-24 DIAGNOSIS — Z0000 Encounter for general adult medical examination without abnormal findings: Secondary | ICD-10-CM

## 2016-08-24 DIAGNOSIS — Z1231 Encounter for screening mammogram for malignant neoplasm of breast: Secondary | ICD-10-CM

## 2016-08-24 LAB — CBC WITH DIFFERENTIAL/PLATELET
Basophils Absolute: 0.1 10*3/uL (ref 0.0–0.1)
Basophils Relative: 0.9 % (ref 0.0–3.0)
Eosinophils Absolute: 0 10*3/uL (ref 0.0–0.7)
Eosinophils Relative: 0.8 % (ref 0.0–5.0)
HCT: 35.4 % — ABNORMAL LOW (ref 36.0–46.0)
Hemoglobin: 12 g/dL (ref 12.0–15.0)
Lymphocytes Relative: 27.6 % (ref 12.0–46.0)
Lymphs Abs: 1.7 10*3/uL (ref 0.7–4.0)
MCHC: 33.8 g/dL (ref 30.0–36.0)
MCV: 85 fl (ref 78.0–100.0)
Monocytes Absolute: 0.5 10*3/uL (ref 0.1–1.0)
Monocytes Relative: 7.2 % (ref 3.0–12.0)
Neutro Abs: 4 10*3/uL (ref 1.4–7.7)
Neutrophils Relative %: 63.5 % (ref 43.0–77.0)
Platelets: 102 10*3/uL — ABNORMAL LOW (ref 150.0–400.0)
RBC: 4.17 Mil/uL (ref 3.87–5.11)
RDW: 15.9 % — ABNORMAL HIGH (ref 11.5–15.5)
WBC: 6.3 10*3/uL (ref 4.0–10.5)

## 2016-08-24 LAB — BASIC METABOLIC PANEL
BUN: 11 mg/dL (ref 6–23)
CO2: 32 mEq/L (ref 19–32)
Calcium: 9.3 mg/dL (ref 8.4–10.5)
Chloride: 97 mEq/L (ref 96–112)
Creatinine, Ser: 0.52 mg/dL (ref 0.40–1.20)
GFR: 122.45 mL/min (ref >60.00)
Glucose, Bld: 199 mg/dL — ABNORMAL HIGH (ref 70–99)
Potassium: 3.7 mEq/L (ref 3.5–5.1)
Sodium: 137 mEq/L (ref 135–145)

## 2016-08-24 LAB — LIPID PANEL
Cholesterol: 128 mg/dL (ref 0–200)
HDL: 48.9 mg/dL (ref >39.00)
LDL Cholesterol: 39 mg/dL (ref 0–99)
NonHDL: 78.97
Total CHOL/HDL Ratio: 3
Triglycerides: 200 mg/dL — ABNORMAL HIGH (ref 0.0–149.0)
VLDL: 40 mg/dL (ref 0.0–40.0)

## 2016-08-24 LAB — HEPATIC FUNCTION PANEL
ALT: 17 U/L (ref 0–35)
AST: 24 U/L (ref 0–37)
Albumin: 4.3 g/dL (ref 3.5–5.2)
Alkaline Phosphatase: 58 U/L (ref 39–117)
Bilirubin, Direct: 0.2 mg/dL (ref 0.0–0.3)
Total Bilirubin: 1 mg/dL (ref 0.2–1.2)
Total Protein: 7.2 g/dL (ref 6.0–8.3)

## 2016-08-24 LAB — HEMOGLOBIN A1C: Hgb A1c MFr Bld: 7.8 % — ABNORMAL HIGH (ref 4.6–6.5)

## 2016-08-24 LAB — TSH: TSH: 1.7 u[IU]/mL (ref 0.35–4.50)

## 2016-08-24 MED ORDER — ALPRAZOLAM 0.5 MG PO TABS: ORAL_TABLET | ORAL | 3 refills | Status: DC

## 2016-08-24 NOTE — Assessment & Plan Note (Signed)
Pt's PE unchanged from previous and WNL w/ exception of obesity and hepatomegaly.  UTD on colonoscopy.  Due for mammo and DEXA- pt plans to schedule after 08/30/16 due to insurance reasons.  Flu shot given today.  Due for Pneumovax- will get this at next appt.  Written screening schedule updated and given to pt.  Check labs.  Anticipatory guidance provided.

## 2016-08-24 NOTE — Assessment & Plan Note (Signed)
Chronic problem.  Tolerating Metformin w/o difficulty.  Due for eye exam- pt plans to schedule after 08/30/16 when she doesn't have to worry about the donut hole.  Foot exam done today.  On ARB for renal protection.  Check labs.  Adjust meds prn

## 2016-08-24 NOTE — Progress Notes (Signed)
Pre visit review using our clinic review tool, if applicable. No additional management support is needed unless otherwise documented below in the visit note. 

## 2016-08-24 NOTE — Assessment & Plan Note (Signed)
Chronic problem.  Tolerating statin w/o difficulty.  Check labs.  Adjust meds prn  

## 2016-08-24 NOTE — Progress Notes (Signed)
   Subjective:    Patient ID: Rachael Buck, female    DOB: June 05, 1941, 75 y.o.   MRN: [id number]  HPI Here today for CPE.  Risk Factors: HTN- chronic problem, on Losartan, HCTZ w/ adequate control Hyperlipidemia- chronic problem, on Simvastatin daily DM- chronic problem, on Metformin twice daily.  On ARB for renal protection.  Due for eye exam- plans to schedule after 1/1, foot exam. Physical Activity: limited, not exercising regularly Fall Risk: low Depression: denies current sxs Hearing: decreased to conversational tones and whispered voice ADL's: independent Cognitive: normal linear thought process, memory and attention intact Home Safety: safe at home, lives w/ husband Height, Weight, BMI, Visual Acuity: see vitals, vision corrected to 20/20 w/ glasses Counseling: UTD on colonoscopy, due for mammo, DEXA- pt plans to schedule after 08/30/16, flu Pneumovax Care team reviewed and updated w/ pt Labs Ordered: See A&P Care Plan: See A&P    Review of Systems Patient reports no vision/ hearing changes, adenopathy,fever, weight change,  persistant/recurrent hoarseness , swallowing issues, chest pain, palpitations, edema, persistant/recurrent cough, hemoptysis, dyspnea (rest/exertional/paroxysmal nocturnal), gastrointestinal bleeding (melena, rectal bleeding), abdominal pain, significant heartburn, bowel changes, GU symptoms (dysuria, hematuria, incontinence), Gyn symptoms (abnormal  bleeding, pain),  syncope, focal weakness, memory loss, numbness & tingling, skin/hair/nail changes, abnormal bruising or bleeding, anxiety, or depression.     Objective:   Physical Exam General Appearance:    Alert, cooperative, no distress, appears stated age  Head:    Normocephalic, without obvious abnormality, atraumatic  Eyes:    PERRL, conjunctiva/corneas clear, EOM's intact, fundi    benign, both eyes  Ears:    Normal TM's and external ear canals, both ears  Nose:   Nares normal, septum midline,  mucosa normal, no drainage    or sinus tenderness  Throat:   Lips, mucosa, and tongue normal; teeth and gums normal  Neck:   Supple, symmetrical, trachea midline, no adenopathy;    Thyroid: no enlargement/tenderness/nodules  Back:     Symmetric, no curvature, ROM normal, no CVA tenderness  Lungs:     Clear to auscultation bilaterally, respirations unlabored  Chest Wall:    No tenderness or deformity   Heart:    Regular rate and rhythm, S1 and S2 normal, no murmur, rub   or gallop  Breast Exam:    Deferred to mammo  Abdomen:     Soft, non-tender, bowel sounds active all four quadrants,    no masses, + hepatomegaly  Genitalia:    Deferred  Rectal:    Extremities:   Extremities normal, atraumatic, no cyanosis or edema  Pulses:   2+ and symmetric all extremities  Skin:   Skin color, texture, turgor normal, no rashes or lesions  Lymph nodes:   Cervical, supraclavicular, and axillary nodes normal  Neurologic:   CNII-XII intact, normal strength, sensation and reflexes    throughout          Assessment & Plan:

## 2016-08-24 NOTE — Patient Instructions (Signed)
Follow up in 3-4 months to recheck diabetes We'll notify you of your lab results and make any changes if needed Continue to work on healthy diet and regular exercise- you can do it! You are up to date on colonoscopy- yay! Please call and schedule your [facility] Call and schedule your eye exam at your convenience You are due for a Pneumovax (pneumonia vaccine) but we did the flu shot today- we can do the pneumonia next time Call with any questions or concerns Happy New Year!!!

## 2016-08-24 NOTE — Assessment & Plan Note (Signed)
Chronic problem.  Adequate control on Losartan and HCTZ daily.  Asymptomatic at this time.  Check labs.  No anticipated med changes.  Will follow.

## 2016-08-25 ENCOUNTER — Other Ambulatory Visit: Payer: Self-pay | Admitting: Family Medicine

## 2016-08-25 DIAGNOSIS — E2839 Other primary ovarian failure: Secondary | ICD-10-CM

## 2016-09-02 ENCOUNTER — Encounter: Payer: Self-pay | Admitting: Family Medicine

## 2016-09-02 ENCOUNTER — Ambulatory Visit (INDEPENDENT_AMBULATORY_CARE_PROVIDER_SITE_OTHER): Payer: PPO | Admitting: Family Medicine

## 2016-09-02 ENCOUNTER — Other Ambulatory Visit: Payer: Self-pay | Admitting: General Practice

## 2016-09-02 VITALS — BP 112/78 | HR 89 | Temp 98.6°F | Resp 18 | Ht 65.0 in | Wt 207.0 lb

## 2016-09-02 DIAGNOSIS — J45909 Unspecified asthma, uncomplicated: Secondary | ICD-10-CM | POA: Diagnosis not present

## 2016-09-02 DIAGNOSIS — J209 Acute bronchitis, unspecified: Secondary | ICD-10-CM

## 2016-09-02 MED ORDER — AZITHROMYCIN 250 MG PO TABS: ORAL_TABLET | ORAL | 0 refills | Status: DC

## 2016-09-02 MED ORDER — ALBUTEROL SULFATE HFA 108 (90 BASE) MCG/ACT IN AERS: 2.0000 | INHALATION_SPRAY | RESPIRATORY_TRACT | 6 refills | Status: AC | PRN

## 2016-09-02 MED ORDER — ALBUTEROL SULFATE (2.5 MG/3ML) 0.083% IN NEBU
2.5000 mg | INHALATION_SOLUTION | Freq: Once | RESPIRATORY_TRACT | Status: AC
  Administered 2016-09-02: 2.5 mg via RESPIRATORY_TRACT

## 2016-09-02 MED ORDER — GUAIFENESIN-CODEINE 100-10 MG/5ML PO SYRP: 10.0000 mL | ORAL_SOLUTION | Freq: Three times a day (TID) | ORAL | 0 refills | Status: DC | PRN

## 2016-09-02 NOTE — Patient Instructions (Signed)
Follow up as needed Start the Zpack as directed Continue your albuterol inhaler as needed- 2 puffs every 4 hrs Use the cough syrup as needed- may cause drowsiness REST! Call with any questions or concerns Hang in there!!!

## 2016-09-02 NOTE — Progress Notes (Signed)
   Subjective:    Patient ID: Rachael Buck, female    DOB: 06/08/1941, 76 y.o.   MRN: [id number]  HPI Sore throat- sxs started Monday w/ 'cough, cough, cough'.  'it's in my chest now'.  Pt is not able to lie down or recline- has to sit up straight.  Cough is productive.  No fevers.  No sinus pain/pressure.  + wheezing.  + hx of asthma.   Review of Systems For ROS see HPI     Objective:   Physical Exam  Constitutional: She is oriented to person, place, and time. She appears well-developed and well-nourished. No distress.  HENT:  Head: Normocephalic and atraumatic.  TMs normal bilaterally Mild nasal congestion Throat w/out erythema, edema, or exudate  Eyes: Conjunctivae and EOM are normal. Pupils are equal, round, and reactive to light.  Neck: Normal range of motion. Neck supple.  Cardiovascular: Normal rate, regular rhythm, normal heart sounds and intact distal pulses.   No murmur heard. Pulmonary/Chest: Effort normal. No respiratory distress. She has wheezes (diffuse expiratory wheezing bilaterally- improved s/p neb tx).  + hacking cough  Lymphadenopathy:    She has no cervical adenopathy.  Neurological: She is alert and oriented to person, place, and time.  Skin: Skin is warm and dry.  Psychiatric: She has a normal mood and affect. Her behavior is normal. Thought content normal.  Vitals reviewed.         Assessment & Plan:

## 2016-09-02 NOTE — Progress Notes (Signed)
Pre visit review using our clinic review tool, if applicable. No additional management support is needed unless otherwise documented below in the visit note. 

## 2016-09-03 NOTE — Assessment & Plan Note (Signed)
Recurrent issue for pt but this is an acute illness.  Wheezing and SOB improved s/p neb tx.  Start abx.  Cough meds prn.  Reviewed supportive care and red flags that should prompt return.  Pt expressed understanding and is in agreement w/ plan.

## 2016-09-24 ENCOUNTER — Other Ambulatory Visit: Payer: Self-pay | Admitting: Family Medicine

## 2016-09-27 DIAGNOSIS — A09 Infectious gastroenteritis and colitis, unspecified: Secondary | ICD-10-CM | POA: Diagnosis not present

## 2016-09-27 DIAGNOSIS — K210 Gastro-esophageal reflux disease with esophagitis: Secondary | ICD-10-CM | POA: Diagnosis not present

## 2016-09-27 DIAGNOSIS — K7581 Nonalcoholic steatohepatitis (NASH): Secondary | ICD-10-CM | POA: Diagnosis not present

## 2016-10-26 ENCOUNTER — Other Ambulatory Visit: Payer: Self-pay | Admitting: Family Medicine

## 2016-11-01 DIAGNOSIS — M1712 Unilateral primary osteoarthritis, left knee: Secondary | ICD-10-CM | POA: Diagnosis not present

## 2016-12-07 ENCOUNTER — Ambulatory Visit
Admission: RE | Admit: 2016-12-07 | Discharge: 2016-12-07 | Disposition: A | Discharge status: Home / Self Care | Payer: PPO | Source: Ambulatory Visit | Attending: Family Medicine | Admitting: Family Medicine

## 2016-12-07 DIAGNOSIS — Z780 Asymptomatic menopausal state: Secondary | ICD-10-CM | POA: Diagnosis not present

## 2016-12-07 DIAGNOSIS — Z13820 Encounter for screening for osteoporosis: Secondary | ICD-10-CM | POA: Diagnosis not present

## 2016-12-07 DIAGNOSIS — E2839 Other primary ovarian failure: Secondary | ICD-10-CM

## 2016-12-07 DIAGNOSIS — Z1231 Encounter for screening mammogram for malignant neoplasm of breast: Secondary | ICD-10-CM

## 2016-12-13 DIAGNOSIS — M1712 Unilateral primary osteoarthritis, left knee: Secondary | ICD-10-CM | POA: Diagnosis not present

## 2016-12-14 ENCOUNTER — Other Ambulatory Visit: Payer: Self-pay | Admitting: Family Medicine

## 2016-12-24 ENCOUNTER — Other Ambulatory Visit: Payer: Self-pay | Admitting: Physician Assistant

## 2016-12-27 NOTE — Telephone Encounter (Signed)
Medication filled to pharmacy as requested.   

## 2016-12-27 NOTE — Telephone Encounter (Signed)
Last OV 09/02/16 (bronchitis) Alprazolam last filled 08/24/16 #90 with 3

## 2017-01-11 ENCOUNTER — Other Ambulatory Visit: Payer: Self-pay | Admitting: Family Medicine

## 2017-01-13 DIAGNOSIS — M1712 Unilateral primary osteoarthritis, left knee: Secondary | ICD-10-CM | POA: Diagnosis not present

## 2017-01-19 DIAGNOSIS — M1712 Unilateral primary osteoarthritis, left knee: Secondary | ICD-10-CM | POA: Diagnosis not present

## 2017-01-25 ENCOUNTER — Other Ambulatory Visit: Payer: Self-pay | Admitting: Family Medicine

## 2017-01-25 NOTE — Telephone Encounter (Signed)
Last OV 09/02/16 (bronchitis) Alprazolam last filled 12/27/16 #90 with 0

## 2017-01-26 NOTE — Telephone Encounter (Signed)
Medication filled to pharmacy as requested.   

## 2017-01-27 ENCOUNTER — Encounter: Payer: Self-pay | Admitting: Family Medicine

## 2017-01-27 ENCOUNTER — Ambulatory Visit (INDEPENDENT_AMBULATORY_CARE_PROVIDER_SITE_OTHER): Payer: PPO | Admitting: Family Medicine

## 2017-01-27 VITALS — BP 121/68 | HR 90 | Temp 98.1°F | Resp 17 | Ht 65.0 in | Wt 213.4 lb

## 2017-01-27 DIAGNOSIS — E119 Type 2 diabetes mellitus without complications: Secondary | ICD-10-CM | POA: Diagnosis not present

## 2017-01-27 DIAGNOSIS — Z01818 Encounter for other preprocedural examination: Secondary | ICD-10-CM | POA: Diagnosis not present

## 2017-01-27 LAB — HEPATIC FUNCTION PANEL
ALT: 16 U/L (ref 0–35)
AST: 24 U/L (ref 0–37)
Albumin: 4.1 g/dL (ref 3.5–5.2)
Alkaline Phosphatase: 59 U/L (ref 39–117)
Bilirubin, Direct: 0.2 mg/dL (ref 0.0–0.3)
Total Bilirubin: 0.8 mg/dL (ref 0.2–1.2)
Total Protein: 7 g/dL (ref 6.0–8.3)

## 2017-01-27 LAB — CBC WITH DIFFERENTIAL/PLATELET
Basophils Absolute: 0 10*3/uL (ref 0.0–0.1)
Basophils Relative: 0.7 % (ref 0.0–3.0)
Eosinophils Absolute: 0 10*3/uL (ref 0.0–0.7)
Eosinophils Relative: 0.7 % (ref 0.0–5.0)
HCT: 32.4 % — ABNORMAL LOW (ref 36.0–46.0)
Hemoglobin: 11 g/dL — ABNORMAL LOW (ref 12.0–15.0)
Lymphocytes Relative: 23.5 % (ref 12.0–46.0)
Lymphs Abs: 1.2 10*3/uL (ref 0.7–4.0)
MCHC: 34.1 g/dL (ref 30.0–36.0)
MCV: 81.6 fl (ref 78.0–100.0)
Monocytes Absolute: 0.4 10*3/uL (ref 0.1–1.0)
Monocytes Relative: 7.5 % (ref 3.0–12.0)
Neutro Abs: 3.5 10*3/uL (ref 1.4–7.7)
Neutrophils Relative %: 67.6 % (ref 43.0–77.0)
Platelets: 83 10*3/uL — ABNORMAL LOW (ref 150.0–400.0)
RBC: 3.97 Mil/uL (ref 3.87–5.11)
RDW: 17.1 % — ABNORMAL HIGH (ref 11.5–15.5)
WBC: 5.1 10*3/uL (ref 4.0–10.5)

## 2017-01-27 LAB — HEMOGLOBIN A1C: Hgb A1c MFr Bld: 8.7 % — ABNORMAL HIGH (ref 4.6–6.5)

## 2017-01-27 LAB — LIPID PANEL
Cholesterol: 127 mg/dL (ref 0–200)
HDL: 51.5 mg/dL (ref >39.00)
NonHDL: 75.15
Total CHOL/HDL Ratio: 2
Triglycerides: 219 mg/dL — ABNORMAL HIGH (ref 0.0–149.0)
VLDL: 43.8 mg/dL — ABNORMAL HIGH (ref 0.0–40.0)

## 2017-01-27 LAB — BASIC METABOLIC PANEL
BUN: 10 mg/dL (ref 6–23)
CO2: 31 mEq/L (ref 19–32)
Calcium: 9.3 mg/dL (ref 8.4–10.5)
Chloride: 93 mEq/L — ABNORMAL LOW (ref 96–112)
Creatinine, Ser: 0.54 mg/dL (ref 0.40–1.20)
GFR: 117.1 mL/min (ref >60.00)
Glucose, Bld: 330 mg/dL — ABNORMAL HIGH (ref 70–99)
Potassium: 3.3 mEq/L — ABNORMAL LOW (ref 3.5–5.1)
Sodium: 133 mEq/L — ABNORMAL LOW (ref 135–145)

## 2017-01-27 LAB — TSH: TSH: 2.49 u[IU]/mL (ref 0.35–4.50)

## 2017-01-27 LAB — LDL CHOLESTEROL, DIRECT: Direct LDL: 51 mg/dL

## 2017-01-27 NOTE — Patient Instructions (Signed)
Follow up in 3-4 months to recheck diabetes We'll notify you of your lab results and make any changes if needed Hold the Metformin for 24 hrs prior to surgery Hold aspirin or anti-inflammatories for at least 7 days prior to surgery Call with any questions or concerns GOOD LUCK!!!

## 2017-01-27 NOTE — Progress Notes (Signed)
Subjective:    Rachael Buck is a 76 y.o. female who presents to the office today for a preoperative consultation at the request of surgeon Dr Veverly Fells who plans on performing L TKR on TBD  . This consultation is requested for the specific conditions prompting preoperative evaluation (i.e. because of potential affect on operative risk): DM, esophageal varices, NASH. Planned anesthesia: general. The patient has the following known anesthesia issues: no known anesthesia issues. Patients bleeding risk: no recent abnormal bleeding and no use of Ca-channel blockers. Patient does not have objections to receiving blood products if needed.  The following portions of the patient's history were reviewed and updated as appropriate: allergies, current medications, past family history, past medical history, past social history, past surgical history and problem list.  Review of Systems A comprehensive review of systems was negative.    Objective:    BP 121/68   Pulse 90   Temp 98.1 F (36.7 C) (Oral)   Resp 17   Ht 5\' 5"  (1.651 m)   Wt 213 lb 6 oz (96.8 kg)   SpO2 98%   BMI 35.51 kg/m   General Appearance:    Alert, cooperative, no distress, appears stated age, obese  Head:    Normocephalic, without obvious abnormality, atraumatic  Eyes:    PERRL, conjunctiva/corneas clear, EOM's intact, fundi    benign, both eyes  Ears:    Normal TM's and external ear canals, both ears  Nose:   Nares normal, septum midline, mucosa normal, no drainage    or sinus tenderness  Throat:   Lips, mucosa, and tongue normal; teeth and gums normal  Neck:   Supple, symmetrical, trachea midline, no adenopathy;    thyroid:  no enlargement/tenderness/nodules  Back:     Symmetric, no curvature, ROM normal, no CVA tenderness  Lungs:     Clear to auscultation bilaterally, respirations unlabored  Chest Wall:    No tenderness or deformity   Heart:    Regular rate and rhythm, S1 and S2 normal, no murmur, rub   or gallop   Breast Exam:    No tenderness, masses, or nipple abnormality  Abdomen:     Soft, non-tender, bowel sounds active all four quadrants, + hepatomegaly  Genitalia:    deferred  Rectal:    Extremities:   Extremities normal, atraumatic, no cyanosis or edema  Pulses:   2+ and symmetric all extremities  Skin:   Skin color, texture, turgor normal, no rashes or lesions  Lymph nodes:   Cervical, supraclavicular, and axillary nodes normal  Neurologic:   CNII-XII intact, normal strength, sensation and reflexes    throughout    Predictors of intubation difficulty:  Morbid obesity? no  Anatomically abnormal facies? no  Prominent incisors? no  Receding mandible? no  Short, thick neck? no  Neck range of motion: normal  Dentition: No chipped, loose, or missing teeth.  Cardiographics ECG:   Echocardiogram: not done  Imaging Chest x-ray: NA   Lab Review  Office Visit on 08/24/2016  Component Date Value  . Hgb A1c MFr Bld 08/24/2016 7.8*  . Cholesterol 08/24/2016 128   . Triglycerides 08/24/2016 200.0*  . HDL 08/24/2016 48.90   . VLDL 08/24/2016 40.0   . LDL Cholesterol 08/24/2016 39   . Total CHOL/HDL Ratio 08/24/2016 3   . NonHDL 08/24/2016 78.97   . Sodium 08/24/2016 137   . Potassium 08/24/2016 3.7   . Chloride 08/24/2016 97   . CO2 08/24/2016 32   . Glucose,  Bld 08/24/2016 199*  . BUN 08/24/2016 11   . Creatinine, Ser 08/24/2016 0.52   . Calcium 08/24/2016 9.3   . GFR 08/24/2016 122.45   . TSH 08/24/2016 1.70   . Total Bilirubin 08/24/2016 1.0   . Bilirubin, Direct 08/24/2016 0.2   . Alkaline Phosphatase 08/24/2016 58   . AST 08/24/2016 24   . ALT 08/24/2016 17   . Total Protein 08/24/2016 7.2   . Albumin 08/24/2016 4.3   . WBC 08/24/2016 6.3   . RBC 08/24/2016 4.17   . Hemoglobin 08/24/2016 12.0   . HCT 08/24/2016 35.4*  . MCV 08/24/2016 85.0   . MCHC 08/24/2016 33.8   . RDW 08/24/2016 15.9*  . Platelets 08/24/2016 102.0*  . Neutrophils Relative % 08/24/2016 63.5    . Lymphocytes Relative 08/24/2016 27.6   . Monocytes Relative 08/24/2016 7.2   . Eosinophils Relative 08/24/2016 0.8   . Basophils Relative 08/24/2016 0.9   . Neutro Abs 08/24/2016 4.0   . Lymphs Abs 08/24/2016 1.7   . Monocytes Absolute 08/24/2016 0.5   . Eosinophils Absolute 08/24/2016 0.0   . Basophils Absolute 08/24/2016 0.1       Assessment:      76 y.o. female with planned surgery as above.   Known risk factors for perioperative complications: Diabetes mellitus Hepatic dysfunction   Difficulty with intubation is not anticipated.  Cardiac Risk Estimation: low  Current medications which may produce withdrawal symptoms if withheld perioperatively: Alprazolam     Plan:    1. Preoperative workup as follows ECG, hemoglobin, electrolytes, creatinine, glucose, liver function studies. 2. Change in medication regimen before surgery: discontinue ASA 14 days before surgery, discontinue NSAIDs (if applicable) 14 days before surgery and discontinue Metformin 24 hours before surgery. 3. Prophylaxis for cardiac events with perioperative beta-blockers: should be considered, specific regimen per anesthesia. 4. Invasive hemodynamic monitoring perioperatively: at the discretion of anesthesiologist. 5. Deep vein thrombosis prophylaxis postoperatively:regimen to be chosen by surgical team. 6. Surveillance for postoperative MI with ECG immediately postoperatively and on postoperative days 1 and 2 AND troponin levels 24 hours postoperatively and on day 4 or hospital discharge (whichever comes first): at the discretion of anesthesiologist. 7. Other measures: none

## 2017-01-27 NOTE — Progress Notes (Signed)
Pre visit review using our clinic review tool, if applicable. No additional management support is needed unless otherwise documented below in the visit note. 

## 2017-01-28 ENCOUNTER — Other Ambulatory Visit: Payer: Self-pay | Admitting: General Practice

## 2017-01-28 MED ORDER — FERROUS SULFATE 325 (65 FE) MG PO TABS: 325.0000 mg | ORAL_TABLET | Freq: Every day | ORAL | 1 refills | Status: DC

## 2017-01-28 MED ORDER — SITAGLIPTIN PHOSPHATE 100 MG PO TABS: 100.0000 mg | ORAL_TABLET | Freq: Every day | ORAL | 6 refills | Status: DC

## 2017-02-08 ENCOUNTER — Encounter (HOSPITAL_COMMUNITY)
Admission: RE | Admit: 2017-02-08 | Discharge: 2017-02-08 | Disposition: A | Discharge status: Home / Self Care | Payer: PPO | Source: Ambulatory Visit | Attending: Orthopedic Surgery | Admitting: Orthopedic Surgery

## 2017-02-08 ENCOUNTER — Encounter (HOSPITAL_COMMUNITY): Payer: Self-pay

## 2017-02-08 DIAGNOSIS — Z01818 Encounter for other preprocedural examination: Secondary | ICD-10-CM | POA: Diagnosis not present

## 2017-02-08 DIAGNOSIS — E785 Hyperlipidemia, unspecified: Secondary | ICD-10-CM | POA: Insufficient documentation

## 2017-02-08 DIAGNOSIS — K7581 Nonalcoholic steatohepatitis (NASH): Secondary | ICD-10-CM | POA: Insufficient documentation

## 2017-02-08 DIAGNOSIS — Z87891 Personal history of nicotine dependence: Secondary | ICD-10-CM | POA: Diagnosis not present

## 2017-02-08 DIAGNOSIS — Z7984 Long term (current) use of oral hypoglycemic drugs: Secondary | ICD-10-CM | POA: Insufficient documentation

## 2017-02-08 DIAGNOSIS — Z01812 Encounter for preprocedural laboratory examination: Secondary | ICD-10-CM | POA: Insufficient documentation

## 2017-02-08 DIAGNOSIS — J45909 Unspecified asthma, uncomplicated: Secondary | ICD-10-CM | POA: Diagnosis not present

## 2017-02-08 DIAGNOSIS — Z79899 Other long term (current) drug therapy: Secondary | ICD-10-CM | POA: Insufficient documentation

## 2017-02-08 DIAGNOSIS — M1712 Unilateral primary osteoarthritis, left knee: Secondary | ICD-10-CM | POA: Diagnosis not present

## 2017-02-08 DIAGNOSIS — K219 Gastro-esophageal reflux disease without esophagitis: Secondary | ICD-10-CM | POA: Diagnosis not present

## 2017-02-08 DIAGNOSIS — E119 Type 2 diabetes mellitus without complications: Secondary | ICD-10-CM | POA: Diagnosis not present

## 2017-02-08 DIAGNOSIS — I10 Essential (primary) hypertension: Secondary | ICD-10-CM | POA: Diagnosis not present

## 2017-02-08 HISTORY — DX: Pneumonia, unspecified organism: J18.9

## 2017-02-08 HISTORY — DX: Anxiety disorder, unspecified: F41.9

## 2017-02-08 LAB — COMPREHENSIVE METABOLIC PANEL
ALT: 21 U/L (ref 14–54)
AST: 31 U/L (ref 15–41)
Albumin: 3.9 g/dL (ref 3.5–5.0)
Alkaline Phosphatase: 62 U/L (ref 38–126)
Anion gap: 11 (ref 5–15)
BUN: 7 mg/dL (ref 6–20)
CO2: 27 mmol/L (ref 22–32)
Calcium: 9.2 mg/dL (ref 8.9–10.3)
Chloride: 95 mmol/L — ABNORMAL LOW (ref 101–111)
Creatinine, Ser: 0.48 mg/dL (ref 0.44–1.00)
GFR calc Af Amer: >60 mL/min (ref >60)
GFR calc non Af Amer: >60 mL/min (ref >60)
Glucose, Bld: 162 mg/dL — ABNORMAL HIGH (ref 65–99)
Potassium: 3.4 mmol/L — ABNORMAL LOW (ref 3.5–5.1)
Sodium: 133 mmol/L — ABNORMAL LOW (ref 135–145)
Total Bilirubin: 1.1 mg/dL (ref 0.3–1.2)
Total Protein: 7.4 g/dL (ref 6.5–8.1)

## 2017-02-08 LAB — CBC
HCT: 35.4 % — ABNORMAL LOW (ref 36.0–46.0)
Hemoglobin: 11.7 g/dL — ABNORMAL LOW (ref 12.0–15.0)
MCH: 27.7 pg (ref 26.0–34.0)
MCHC: 33.1 g/dL (ref 30.0–36.0)
MCV: 83.9 fL (ref 78.0–100.0)
Platelets: 60 10*3/uL — ABNORMAL LOW (ref 150–400)
RBC: 4.22 MIL/uL (ref 3.87–5.11)
RDW: 17.1 % — ABNORMAL HIGH (ref 11.5–15.5)
WBC: 6.8 10*3/uL (ref 4.0–10.5)

## 2017-02-08 LAB — GLUCOSE, CAPILLARY: Glucose-Capillary: 175 mg/dL — ABNORMAL HIGH (ref 65–99)

## 2017-02-08 LAB — SURGICAL PCR SCREEN
MRSA, PCR: NEGATIVE
Staphylococcus aureus: NEGATIVE

## 2017-02-08 NOTE — Pre-Procedure Instructions (Signed)
Rachael Buck  02/08/2017      [facility] - [place], [place] [address] Phone: [phone] Fax: [phone]  [facility] [place], [address] AT [facility] [address] [place] [address] Phone: [phone] Fax: [phone]  [facility] [place], [place] [address] [place] [address] Phone: [phone] Fax: [phone]    Your procedure is scheduled on June 22  Report to [facility] at 1100 A.M.  Call this number if you have problems the morning of surgery:  [phone]   Remember:  Do not eat food or drink liquids after midnight  Take these medicines the morning of surgery with A SIP OF WATER Bring you inhalers with you on the day of surgery, Albuterol inhaler if needed, Alprazolam (Xanax) if needed, dicyclomine (Bentyl) if needed, Flonase nasal spray if needed, Omeprazole (Prilosec), Sertaline (Zoloft) Tramadol (Ultram) if needed  Stop taking aspirin, BC's, Goody's, Herbal medications, Ibuprofen, Advil, Motrin, Aleve, vitamins    How to Manage Your Diabetes Before and After Surgery  Why is it important to control my blood sugar before and after surgery? . Improving blood sugar levels before and after surgery helps healing and can limit problems. . A way of improving blood sugar control is eating a healthy diet by: o  Eating less sugar and carbohydrates o  Increasing activity/exercise o  Talking with your doctor about reaching your blood sugar goals . High blood sugars (greater than 180 mg/dL) can raise your risk of infections and slow your recovery, so you will need to focus on controlling your diabetes during the weeks before surgery. . Make sure that the doctor who takes care of your diabetes knows about your planned surgery including the date  and location.  How do I manage my blood sugar before surgery? . Check your blood sugar at least 4 times a day, starting 2 days before surgery, to make sure that the level is not too high or low. o Check your blood sugar the morning of your surgery when you wake up and every 2 hours until you get to the Short Stay unit. . If your blood sugar is less than 70 mg/dL, you will need to treat for low blood sugar: o Do not take insulin. o Treat a low blood sugar (less than 70 mg/dL) with  cup of clear juice (cranberry or apple), 4 glucose tablets, OR glucose gel. o Recheck blood sugar in 15 minutes after treatment (to make sure it is greater than 70 mg/dL). If your blood sugar is not greater than 70 mg/dL on recheck, call [phone] for further instructions. . Report your blood sugar to the short stay nurse when you get to Short Stay.  . If you are admitted to the hospital after surgery: o Your blood sugar will be checked by the staff and you will probably be given insulin after surgery (instead of oral diabetes medicines) to make sure you have good blood sugar levels. o The goal for blood sugar control after surgery is 80-180 mg/dL.      WHAT DO I DO ABOUT MY DIABETES MEDICATION?   Marland Kitchen Do not take oral diabetes medicines (pills) the morning of surgery. Sitagliptin (Januvia)       . The day of  surgery, do not take other diabetes injectables, including Byetta (exenatide), Bydureon (exenatide ER), Victoza (liraglutide), or Trulicity (dulaglutide).  . If your CBG is greater than 220 mg/dL, you may take  of your sliding scale (correction) dose of insulin.  Other Instructions:          Patient Signature:  Date:   Nurse Signature:  Date:   Reviewed and Endorsed by [facility] Patient Education Committee, August 2015  Do not wear jewelry, make-up or nail polish.  Do not wear lotions, powders, or perfumes, or deoderant.  Do not shave 48 hours prior to surgery.  Men may shave face and  neck.  Do not bring valuables to the hospital.  [facility] is not responsible for any belongings or valuables.  Contacts, dentures or bridgework may not be worn into surgery.  Leave your suitcase in the car.  After surgery it may be brought to your room.  For patients admitted to the hospital, discharge time will be determined by your treatment team.  Patients discharged the day of surgery will not be allowed to drive home.    Special instructions:  [facility] - Preparing for Surgery  Before surgery, you can play an important role.  Because skin is not sterile, your skin needs to be as free of germs as possible.  You can reduce the number of germs on you skin by washing with CHG (chlorahexidine gluconate) soap before surgery.  CHG is an antiseptic cleaner which kills germs and bonds with the skin to continue killing germs even after washing.  Please DO NOT use if you have an allergy to CHG or antibacterial soaps.  If your skin becomes reddened/irritated stop using the CHG and inform your nurse when you arrive at Short Stay.  Do not shave (including legs and underarms) for at least 48 hours prior to the first CHG shower.  You may shave your face.  Please follow these instructions carefully:   1.  Shower with CHG Soap the night before surgery and the   morning of Surgery.  2.  If you choose to wash your hair, wash your hair first as usual with your  normal shampoo.  3.  After you shampoo, rinse your hair and body thoroughly to remove the Shampoo.  4.  Use CHG as you would any other liquid soap.  You can apply chg directly  to the skin and wash gently with scrungie or a clean washcloth.  5.  Apply the CHG Soap to your body ONLY FROM THE NECK DOWN.   Do not use on open wounds or open sores.  Avoid contact with your eyes,  ears, mouth and genitals (private parts).  Wash genitals (private parts)  with your normal soap.  6.  Wash thoroughly, paying special attention to the area where your  surgery will be performed.  7.  Thoroughly rinse your body with warm water from the neck down.  8.  DO NOT shower/wash with your normal soap after using and rinsing off the CHG Soap.  9.  Pat yourself dry with a clean towel.            10.  Wear clean pajamas.            11.  Place clean sheets on your bed the night of your first shower and do not  sleep with pets.  Day of Surgery  Do not apply any lotions/deoderants the morning of surgery.  Please wear clean clothes to the hospital/surgery center.  Please read over the following fact sheets that you were given. Pain Booklet, Coughing and Deep Breathing, Total Joint Packet, MRSA Information and Surgical Site Infection Prevention

## 2017-02-08 NOTE — Progress Notes (Addendum)
Clearance note from Dr Birdie Riddle on chart.  PCP is Dr.  Annye Asa Cardiologist is Dr.Tennant - saw around 15 years   echo 2012 Card cath- [facility] Instructions given for incentive spirometry. Denies any chest pain, cough, or fever.  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

## 2017-02-08 NOTE — H&P (Signed)
Rachael Buck is an 76 y.o. female.    Chief Complaint: left knee pain  HPI: Pt is a 76 y.o. female complaining of left knee pain for multiple years. Pain had continually increased since the beginning. X-rays in the clinic show end-stage arthritic changes of the left knee. Pt has tried various conservative treatments which have failed to alleviate their symptoms, including injections and therapy. Various options are discussed with the patient. Risks, benefits and expectations were discussed with the patient. Patient understand the risks, benefits and expectations and wishes to proceed with surgery.   PCP:  Midge Minium, MD  D/C Plans: Home  PMH: Past Medical History:  Diagnosis Date  . Arthritis   . Asthma   . Cirrhosis, nonalcoholic ([place])   . Colon polyps   . Diabetes mellitus   . Diverticulitis   . GERD (gastroesophageal reflux disease)   . Hyperlipidemia   . Hypertension   . NASH (nonalcoholic steatohepatitis)   . Obesity     PSH: Past Surgical History:  Procedure Laterality Date  . CHOLECYSTECTOMY  1981  . OOPHORECTOMY  1973  . PARTIAL HYSTERECTOMY  1972  . ROTATOR CUFF REPAIR     bilateral. 2006, 2008  . VEIN SURGERY      Social History:  reports that she has quit smoking. She has never used smokeless tobacco. She reports that she does not drink alcohol or use drugs.  Allergies:  Allergies  Allergen Reactions  . Codeine Nausea And Vomiting  . Shrimp [Shellfish Allergy] Swelling    Medications: No current facility-administered medications for this encounter.    Current Outpatient Prescriptions  Medication Sig Dispense Refill  . albuterol (PROAIR HFA) 108 (90 Base) MCG/ACT inhaler Inhale 2 puffs into the lungs every 4 (four) hours as needed for wheezing or shortness of breath. 1 Inhaler 6  . ALPRAZolam (XANAX) 0.5 MG tablet TAKE 1 TABLET BY MOUTH THREE TIMES DAILY AS NEEDED FOR ANIETY OR PAIN (Patient taking differently: TAKE 1 TABLET BY MOUTH THREE  TIMES DAILY AS NEEDED FOR ANXIETY OR PAIN) 90 tablet 0  . colestipol (COLESTID) 1 g tablet Take 1 g by mouth daily.    Marland Kitchen dicyclomine (BENTYL) 20 MG tablet TAKE 1 TABLET BY MOUTH THREE TIMES DAILY AS NEEDED (Patient taking differently: TAKE 1 TABLET (20 MG) BY MOUTH THREE TIMES DAILY AS NEEDED FOR IBS OR ABDOMINAL CRAMPING) 360 tablet 0  . diphenhydrAMINE (BENADRYL) 25 MG tablet Take 25 mg by mouth every 6 (six) hours as needed (for allergies.).    Marland Kitchen ferrous sulfate 325 (65 FE) MG tablet Take 1 tablet (325 mg total) by mouth daily with breakfast. 90 tablet 1  . fluticasone (FLONASE) 50 MCG/ACT nasal spray Place 2 sprays into both nostrils daily. (Patient taking differently: Place 2 sprays into both nostrils daily as needed (for allergies.). ) 16 g 1  . guaiFENesin-codeine (ROBITUSSIN AC) 100-10 MG/5ML syrup Take 10 mLs by mouth 3 (three) times daily as needed for cough. 240 mL 0  . hydrochlorothiazide (HYDRODIURIL) 25 MG tablet TAKE 1 TABLET BY MOUTH EVERY DAY 90 tablet 0  . hydrocortisone 2.5 % cream Apply 1 application topically 2 (two) times daily as needed (for eczema). 30 g 3  . loperamide (IMODIUM) 2 MG capsule Take 4-6 mg by mouth daily as needed (for IBS symptoms).    . losartan (COZAAR) 50 MG tablet TAKE 1 TABLET BY MOUTH EVERY DAY 90 tablet 0  . omeprazole (PRILOSEC) 40 MG capsule TAKE 1 CAPSULE  BY MOUTH EVERY DAY 90 capsule 0  . ONE TOUCH ULTRA TEST test strip USE AS DIRECTED (Patient taking differently: TEST BLOOD SUGARS ONCE DAILY IN THE MORNING) 300 each 0  . ONETOUCH DELICA LANCETS FINE MISC 1 Stick by Does not apply route 2 (two) times daily. (Patient taking differently: 1 each by Other route daily. ) 100 each 3  . sertraline (ZOLOFT) 50 MG tablet TAKE 1 TABLET BY MOUTH EVERY DAY 90 tablet 0  . simvastatin (ZOCOR) 80 MG tablet TAKE 1 TABLET BY MOUTH EVERY DAY 90 tablet 0  . sitaGLIPtin (JANUVIA) 100 MG tablet Take 1 tablet (100 mg total) by mouth daily. 30 tablet 6  . traMADol  (ULTRAM) 50 MG tablet Take 50 mg by mouth every 4 (four) hours as needed. For pain.  0  . azithromycin (ZITHROMAX) 250 MG tablet 2 tabs on day 1, 1 tab on day 2-5 (Patient not taking: Reported on 02/03/2017) 6 tablet 0  . fenofibrate 160 MG tablet Take 1 tablet (160 mg total) by mouth daily. (Patient not taking: Reported on 02/03/2017) 90 tablet 1    No results found for this or any previous visit (from the past 48 hour(s)). No results found.  ROS: Pain with rom of the left lower extremity  Physical Exam:  Alert and oriented 75 y.o. female in no acute distress Cranial nerves 2-12 intact Cervical spine: full rom with no tenderness, nv intact distally Chest: active breath sounds bilaterally, no wheeze rhonchi or rales Heart: regular rate and rhythm, no murmur Abd: non tender non distended with active bowel sounds Hip is stable with rom  Left knee medial and lateral joint line tenderness nv intact distally Antalgic gait No rashes  Assessment/Plan Assessment: left knee end stage osteoarthritis  Plan: Patient will undergo a left total knee arthroplasty by Dr. Veverly Fells at [facility]. Risks benefits and expectations were discussed with the patient. Patient understand risks, benefits and expectations and wishes to proceed.

## 2017-02-09 NOTE — Progress Notes (Addendum)
Anesthesia Chart Review:  Pt is a 76 year old female scheduled for L total knee arthroplasty on 02/18/2017 with Netta Cedars, MD  - PCP is Annye Asa, MD who cleared pt for surgery  PMH includes:  HTN, DM, hyperlipidemia, NASH, asthma, GERD.  Former smoker. BMI 35  Medications include: Albuterol, colestipol, iron, HCTZ, losartan, Prilosec, Zocor, sitagliptin  Preoperative labs reviewed.   - Glucose 162.  HbA1c was 8.7 on 01/27/17.  - Platelets 60.  Baseline platelets appear to be 100-130.  - I reached out to PCP about low platelets.  Dr. Birdie Riddle felt pt needed to be evaluated by hematology before surgery.  She will arrange for referral to hem-onc. I notified Velvet in Dr. Veverly Fells' office of need for hem-onc eval before surgery.   EKG 01/27/17: sinus rhythm. RBBB.   Nuclear stress test 09/15/10: Normal stress nuclear study.  Echo 09/15/10:  - Left ventricle: The cavity size was normal. Wall thickness was increased in a pattern of mild LVH. Systolic function was normal. The estimated ejection fraction was in the range of 55% to 65%. Wall motion was normal; there were no regional wall motion abnormalities. Features are consistent with a pseudonormal left ventricular filling pattern, with concomitant abnormal relaxation and increased filling pressure (grade 2 diastolic dysfunction). - Atrial septum: No defect or patent foramen ovale was identified.  Willeen Cass, FNP-BC [facility] Short Stay Surgical Center/Anesthesiology Phone: [phone] 02/09/2017 3:49 PM  Addendum:   Pt saw Laverna Peace, NP with hematology 02/14/17 and was cleared for surgery with platelet count of 80 secondary to NASH.    If no changes, I anticipate pt can proceed with surgery as scheduled.   Willeen Cass, FNP-BC [facility] Short Stay Surgical Center/Anesthesiology Phone: [phone] 02/16/2017 3:33 PM

## 2017-02-11 ENCOUNTER — Other Ambulatory Visit: Payer: Self-pay | Admitting: Family

## 2017-02-11 ENCOUNTER — Other Ambulatory Visit: Payer: Self-pay | Admitting: Family Medicine

## 2017-02-11 DIAGNOSIS — D696 Thrombocytopenia, unspecified: Secondary | ICD-10-CM

## 2017-02-11 NOTE — Progress Notes (Signed)
Called and informed pt, urgent referral to hematology placed today.

## 2017-02-14 ENCOUNTER — Other Ambulatory Visit (HOSPITAL_BASED_OUTPATIENT_CLINIC_OR_DEPARTMENT_OTHER): Payer: PPO

## 2017-02-14 ENCOUNTER — Ambulatory Visit: Payer: PPO

## 2017-02-14 ENCOUNTER — Ambulatory Visit (HOSPITAL_BASED_OUTPATIENT_CLINIC_OR_DEPARTMENT_OTHER): Payer: PPO | Admitting: Family

## 2017-02-14 VITALS — BP 131/67 | HR 93 | Temp 99.4°F | Resp 20 | Wt 213.0 lb

## 2017-02-14 DIAGNOSIS — Z800 Family history of malignant neoplasm of digestive organs: Secondary | ICD-10-CM | POA: Diagnosis not present

## 2017-02-14 DIAGNOSIS — D696 Thrombocytopenia, unspecified: Secondary | ICD-10-CM

## 2017-02-14 DIAGNOSIS — K7581 Nonalcoholic steatohepatitis (NASH): Secondary | ICD-10-CM | POA: Diagnosis not present

## 2017-02-14 DIAGNOSIS — Z803 Family history of malignant neoplasm of breast: Secondary | ICD-10-CM | POA: Diagnosis not present

## 2017-02-14 DIAGNOSIS — Z832 Family history of diseases of the blood and blood-forming organs and certain disorders involving the immune mechanism: Secondary | ICD-10-CM

## 2017-02-14 LAB — COMPREHENSIVE METABOLIC PANEL (CC13)
ALT: 20 IU/L (ref 0–32)
AST (SGOT): 29 IU/L (ref 0–40)
Albumin, Serum: 4.1 g/dL (ref 3.5–4.8)
Albumin/Globulin Ratio: 1.3 (ref 1.2–2.2)
Alkaline Phosphatase, S: 68 IU/L (ref 39–117)
BUN/Creatinine Ratio: 15 (ref 12–28)
BUN: 7 mg/dL — ABNORMAL LOW (ref 8–27)
Bilirubin Total: 0.7 mg/dL (ref 0.0–1.2)
Calcium, Ser: 9.3 mg/dL (ref 8.7–10.3)
Carbon Dioxide, Total: 28 mmol/L (ref 20–29)
Chloride, Ser: 92 mmol/L — ABNORMAL LOW (ref 96–106)
Creatinine, Ser: 0.47 mg/dL — ABNORMAL LOW (ref 0.57–1.00)
GFR calc Af Amer: 113 mL/min/{1.73_m2} (ref >59)
GFR calc non Af Amer: 98 mL/min/{1.73_m2} (ref >59)
Globulin, Total: 3.1 g/dL (ref 1.5–4.5)
Glucose: 214 mg/dL — ABNORMAL HIGH (ref 65–99)
Potassium, Ser: 3.6 mmol/L (ref 3.5–5.2)
Sodium: 127 mmol/L — ABNORMAL LOW (ref 134–144)
Total Protein: 7.2 g/dL (ref 6.0–8.5)

## 2017-02-14 LAB — CBC WITH DIFFERENTIAL (CANCER CENTER ONLY)
BASO#: 0.1 10*3/uL (ref 0.0–0.2)
BASO%: 1.6 % (ref 0.0–2.0)
EOS%: 0.5 % (ref 0.0–7.0)
Eosinophils Absolute: 0 10*3/uL (ref 0.0–0.5)
HCT: 34.1 % — ABNORMAL LOW (ref 34.8–46.6)
HGB: 11.7 g/dL (ref 11.6–15.9)
LYMPH#: 1.3 10*3/uL (ref 0.9–3.3)
LYMPH%: 22.1 % (ref 14.0–48.0)
MCH: 28.8 pg (ref 26.0–34.0)
MCHC: 34.3 g/dL (ref 32.0–36.0)
MCV: 84 fL (ref 81–101)
MONO#: 0.4 10*3/uL (ref 0.1–0.9)
MONO%: 6.8 % (ref 0.0–13.0)
NEUT#: 4 10*3/uL (ref 1.5–6.5)
NEUT%: 69 % (ref 39.6–80.0)
Platelets: 80 10*3/uL — ABNORMAL LOW (ref 145–400)
RBC: 4.06 10*6/uL (ref 3.70–5.32)
RDW: 17 % — ABNORMAL HIGH (ref 11.1–15.7)
WBC: 5.8 10*3/uL (ref 3.9–10.0)

## 2017-02-14 LAB — CHCC SATELLITE - SMEAR

## 2017-02-14 LAB — LACTATE DEHYDROGENASE: LDH: 152 U/L (ref 125–245)

## 2017-02-14 NOTE — Progress Notes (Signed)
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 She has NASH. Platelet count today is 80 and Hgb is stable at 11.4 with MCV of 84. INR is 1.2 and PT 12.2.  She denies any episodes of bleeding, bruising or petechiae.  She has had several surgeries in the past including bilateral rotator cuff repairs and total hysterectomy without any problems with bleeding.  She is scheduled to have a left knee replacement on Friday (6/22) with Dr. Netta Cedars.  No personal cancer history. Family cancer history includes mother with stomach cancer (died at 75 yo) and daughter with breast cancer.  She had a son that passed away at 75 years old from idiopathic aplastic anemia.  No fever, chills, n/v, cough, rash, dizziness, chest pain, palpitations, abdominal pain or changes in bowel or bladder habits.  She had her last colonoscopy in 2016. She had polyps removed and also had internal and external hemorrhoids.  Mammogram in April was negative.  No lymphadenopathy found on exam.  She has occasional SOB due to exacerbation of asthma.  She has maintained a good appetite and is staying well hydrated. Her weight is stable.  She has uncontrolled diabetes. Her last Hgb A1c was 8.7.  She had IBS which resolved once she stopped Metformin and switched to Januvia.  She has mild puffiness in her feet and ankles that comes and goes. This improves when she props her legs up.  She was a smoker at 1 ppd for 15 years. She quit 20 years ago.  No ETOH.   ROS: All other 10 point review of systems is negative.   PAST MEDICAL HISTORY:   Past Medical History:  Diagnosis Date  . Anxiety   . Arthritis   .  Asthma   . Cirrhosis, nonalcoholic ([place])   . Colon polyps   . Diabetes mellitus   . Diverticulitis   . GERD (gastroesophageal reflux disease)   . Hyperlipidemia   . Hypertension   . NASH (nonalcoholic steatohepatitis)   . Obesity   . Pneumonia     ALLERGIES: Allergies  Allergen Reactions  . Codeine Nausea And Vomiting  . Shrimp [Shellfish Allergy] Swelling      MEDICATIONS:  Current Outpatient Prescriptions on File Prior to Visit  Medication Sig Dispense Refill  . albuterol (PROAIR HFA) 108 (90 Base) MCG/ACT inhaler Inhale 2 puffs into the lungs every 4 (four) hours as needed for wheezing or shortness of breath. 1 Inhaler 6  . ALPRAZolam (XANAX) 0.5 MG tablet TAKE 1 TABLET BY MOUTH THREE TIMES DAILY AS NEEDED FOR ANIETY OR PAIN (Patient taking differently: TAKE 1 TABLET BY MOUTH THREE TIMES DAILY AS NEEDED FOR ANXIETY OR PAIN) 90 tablet 0  . azithromycin (ZITHROMAX) 250 MG tablet 2 tabs on day 1, 1 tab on day 2-5 (Patient not taking: Reported on 02/03/2017) 6 tablet 0  . colestipol (COLESTID) 1 g tablet Take 1 g by mouth daily.    Marland Kitchen dicyclomine (BENTYL) 20 MG tablet TAKE 1 TABLET BY MOUTH THREE TIMES DAILY AS NEEDED (Patient taking differently: TAKE 1 TABLET (20 MG) BY MOUTH THREE TIMES DAILY AS NEEDED FOR IBS OR ABDOMINAL CRAMPING) 360 tablet  0  . diphenhydrAMINE (BENADRYL) 25 MG tablet Take 25 mg by mouth every 6 (six) hours as needed (for allergies.).    Marland Kitchen fenofibrate 160 MG tablet Take 1 tablet (160 mg total) by mouth daily. (Patient not taking: Reported on 02/03/2017) 90 tablet 1  . ferrous sulfate 325 (65 FE) MG tablet Take 1 tablet (325 mg total) by mouth daily with breakfast. 90 tablet 1  . fluticasone (FLONASE) 50 MCG/ACT nasal spray Place 2 sprays into both nostrils daily. (Patient taking differently: Place 2 sprays into both nostrils daily as needed (for allergies.). ) 16 g 1  . guaiFENesin-codeine (ROBITUSSIN AC) 100-10 MG/5ML syrup Take 10 mLs by mouth 3 (three) times daily  as needed for cough. 240 mL 0  . hydrochlorothiazide (HYDRODIURIL) 25 MG tablet TAKE 1 TABLET BY MOUTH EVERY DAY 90 tablet 0  . hydrocortisone 2.5 % cream Apply 1 application topically 2 (two) times daily as needed (for eczema). 30 g 3  . loperamide (IMODIUM) 2 MG capsule Take 4-6 mg by mouth daily as needed (for IBS symptoms).    . losartan (COZAAR) 50 MG tablet TAKE 1 TABLET BY MOUTH EVERY DAY 90 tablet 0  . omeprazole (PRILOSEC) 40 MG capsule TAKE 1 CAPSULE BY MOUTH EVERY DAY 90 capsule 0  . ONE TOUCH ULTRA TEST test strip USE AS DIRECTED (Patient taking differently: TEST BLOOD SUGARS ONCE DAILY IN THE MORNING) 300 each 0  . ONETOUCH DELICA LANCETS FINE MISC 1 Stick by Does not apply route 2 (two) times daily. (Patient taking differently: 1 each by Other route daily. ) 100 each 3  . sertraline (ZOLOFT) 50 MG tablet TAKE 1 TABLET BY MOUTH EVERY DAY 90 tablet 0  . simvastatin (ZOCOR) 80 MG tablet TAKE 1 TABLET BY MOUTH EVERY DAY 90 tablet 0  . sitaGLIPtin (JANUVIA) 100 MG tablet Take 1 tablet (100 mg total) by mouth daily. 30 tablet 6  . traMADol (ULTRAM) 50 MG tablet Take 50 mg by mouth every 4 (four) hours as needed. For pain.  0   No current facility-administered medications on file prior to visit.      PAST SURGICAL HISTORY Past Surgical History:  Procedure Laterality Date  . CHOLECYSTECTOMY  1981  . OOPHORECTOMY  1973  . PARTIAL HYSTERECTOMY  1972  . ROTATOR CUFF REPAIR     bilateral. 2006, 2008  . VEIN SURGERY      FAMILY HISTORY: Family History  Problem Relation Age of Onset  . Breast cancer Daughter   . Breast cancer Maternal Aunt   . Diabetes Brother   . Diabetes Sister   . Stomach cancer Mother   . Cancer Other   . GER disease Other   . Obesity Other     SOCIAL HISTORY:  reports that she has quit smoking. She has never used smokeless tobacco. She reports that she does not drink alcohol or use drugs.  PERFORMANCE STATUS: The patient's performance status is 1 -  Symptomatic but completely ambulatory  PHYSICAL EXAM: Most Recent Vital Signs: Blood pressure 131/67, pulse 93, temperature 99.4 F (37.4 C), temperature source Oral, resp. rate 20, weight 213 lb (96.6 kg), SpO2 100 %. BP 131/67 (BP Location: Left Arm, Patient Position: Sitting)   Pulse 93   Temp 99.4 F (37.4 C) (Oral)   Resp 20   Wt 213 lb (96.6 kg)   SpO2 100%   BMI 35.45 kg/m   General Appearance:    Alert, cooperative, no distress, appears stated age  Head:    Normocephalic, without obvious abnormality, atraumatic  Eyes:    PERRL, conjunctiva/corneas clear, EOM's intact, fundi    benign, both eyes        Throat:   Lips, mucosa, and tongue normal; teeth and gums normal  Neck:   Supple, symmetrical, trachea midline, no adenopathy;    thyroid:  no enlargement/tenderness/nodules; no carotid   bruit or JVD  Back:     Symmetric, no curvature, ROM normal, no CVA tenderness  Lungs:     Clear to auscultation bilaterally, respirations unlabored  Chest Wall:    No tenderness or deformity   Heart:    Regular rate and rhythm, S1 and S2 normal, no murmur, rub   or gallop     Abdomen:     Soft, non-tender, bowel sounds active all four quadrants,    no masses, no organomegaly        Extremities:   Extremities normal, atraumatic, no cyanosis or edema  Pulses:   2+ and symmetric all extremities  Skin:   Skin color, texture, turgor normal, no rashes or lesions  Lymph nodes:   Cervical, supraclavicular, and axillary nodes normal  Neurologic:   CNII-XII intact, normal strength, sensation and reflexes    throughout    LABORATORY DATA:  Results for orders placed or performed in visit on 02/14/17 (from the past 48 hour(s))  CBC w/Diff     Status: Abnormal   Collection Time: 02/14/17  1:35 PM  Result Value Ref Range   WBC 5.8 3.9 - 10.0 10e3/uL   RBC 4.06 3.70 - 5.32 10e6/uL   HGB 11.7 11.6 - 15.9 g/dL   HCT 34.1 (L) 34.8 - 46.6 %   MCV 84 81 - 101 fL   MCH 28.8 26.0 - 34.0 pg   MCHC  34.3 32.0 - 36.0 g/dL   RDW 17.0 (H) 11.1 - 15.7 %   Platelets 80 Platelet count consistent in citrate (L) 145 - 400 10e3/uL    Comment: Result amended from () (02/14/2017 2:02 PM) to (Platelet count consistent in citrate).   NEUT# 4.0 1.5 - 6.5 10e3/uL   LYMPH# 1.3 0.9 - 3.3 10e3/uL   MONO# 0.4 0.1 - 0.9 10e3/uL   Eosinophils Absolute 0.0 0.0 - 0.5 10e3/uL   BASO# 0.1 0.0 - 0.2 10e3/uL   NEUT% 69.0 39.6 - 80.0 %   LYMPH% 22.1 14.0 - 48.0 %   MONO% 6.8 0.0 - 13.0 %   EOS% 0.5 0.0 - 7.0 %   BASO% 1.6 0.0 - 2.0 %  Smear     Status: None   Collection Time: 02/14/17  1:35 PM  Result Value Ref Range   Smear Result Smear Available       RADIOGRAPHY: No results found.     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 Platelet count today is 80 and Hgb is stable at 11.4 with MCV of 84. INR is 1.2. She has no history of bleeding and has not noticed any bruising or petechiae. She has had surgery in the past without complication.  She is scheduled to have a left knee replacement with Dr. Veverly Fells later this week. From our standpoint she is ok to proceed with this procedure.  We will plan to see her back in 2 months for repeat lab work and follow-up.   All questions were answered. She will contact our office with any questions  or concerns. We can certainly see her much sooner if necessary.  She was discussed with and also seen by Dr. Marin Olp and he is in agreement with the aforementioned.   [facility]     Addendum:  I saw and examined the patient with Rachael Buck. I looked at her blood under the the microscope. She had decreased platelets. Platelets were well granulated. She had several large platelets.  Her platelet count is 80,000 today. I think this should be adequate for surgery. Her coags are normal.  I don't think that there should be a bleeding problem with her thrombocytopenia.  If there is, then I think a platelet transfusion is what she would need.  I don't see that there is a underlying hematologic malignancy. I don't see anything that suggest myelodysplasia.  I will like to see her back in a couple months. We will see how her platelet count looks. If there are any issues with surgery, we will be available to help out.  We spent about 40 minutes with her. We answered all of her questions.

## 2017-02-15 DIAGNOSIS — D696 Thrombocytopenia, unspecified: Secondary | ICD-10-CM | POA: Insufficient documentation

## 2017-02-15 LAB — PROTHROMBIN TIME (PT)
INR: 1.2 (ref 0.8–1.2)
Prothrombin Time: 12.2 s — ABNORMAL HIGH (ref 9.1–12.0)

## 2017-02-15 LAB — APTT: aPTT: 27 s (ref 24–33)

## 2017-02-17 MED ORDER — CEFAZOLIN SODIUM-DEXTROSE 2-4 GM/100ML-% IV SOLN
2.0000 g | INTRAVENOUS | Status: AC
  Administered 2017-02-18: 2 g via INTRAVENOUS
  Filled 2017-02-17: qty 100

## 2017-02-17 MED ORDER — TRANEXAMIC ACID 1000 MG/10ML IV SOLN
1000.0000 mg | INTRAVENOUS | Status: AC
  Administered 2017-02-18: 1000 mg via INTRAVENOUS
  Filled 2017-02-17: qty 10

## 2017-02-18 ENCOUNTER — Inpatient Hospital Stay (HOSPITAL_COMMUNITY)
Admission: RE | Admit: 2017-02-18 | Discharge: 2017-02-21 | DRG: 470 | Disposition: A | Discharge status: Home / Self Care | Payer: PPO | Source: Ambulatory Visit | Attending: Orthopedic Surgery | Admitting: Orthopedic Surgery

## 2017-02-18 ENCOUNTER — Inpatient Hospital Stay (HOSPITAL_COMMUNITY): Payer: PPO | Admitting: Emergency Medicine

## 2017-02-18 ENCOUNTER — Inpatient Hospital Stay (HOSPITAL_COMMUNITY): Payer: PPO

## 2017-02-18 ENCOUNTER — Inpatient Hospital Stay (HOSPITAL_COMMUNITY): Payer: PPO | Admitting: Certified Registered"

## 2017-02-18 ENCOUNTER — Encounter (HOSPITAL_COMMUNITY): Payer: Self-pay | Admitting: Certified Registered"

## 2017-02-18 ENCOUNTER — Encounter (HOSPITAL_COMMUNITY)
Admission: RE | Disposition: A | Discharge status: Home / Self Care | Payer: Self-pay | Source: Ambulatory Visit | Attending: Orthopedic Surgery

## 2017-02-18 DIAGNOSIS — I10 Essential (primary) hypertension: Secondary | ICD-10-CM | POA: Diagnosis present

## 2017-02-18 DIAGNOSIS — Z91013 Allergy to seafood: Secondary | ICD-10-CM | POA: Diagnosis not present

## 2017-02-18 DIAGNOSIS — Z90710 Acquired absence of both cervix and uterus: Secondary | ICD-10-CM | POA: Diagnosis not present

## 2017-02-18 DIAGNOSIS — K7460 Unspecified cirrhosis of liver: Secondary | ICD-10-CM | POA: Diagnosis not present

## 2017-02-18 DIAGNOSIS — J45909 Unspecified asthma, uncomplicated: Secondary | ICD-10-CM | POA: Diagnosis present

## 2017-02-18 DIAGNOSIS — Z471 Aftercare following joint replacement surgery: Secondary | ICD-10-CM | POA: Diagnosis not present

## 2017-02-18 DIAGNOSIS — Z96652 Presence of left artificial knee joint: Secondary | ICD-10-CM | POA: Diagnosis not present

## 2017-02-18 DIAGNOSIS — Z87891 Personal history of nicotine dependence: Secondary | ICD-10-CM | POA: Diagnosis not present

## 2017-02-18 DIAGNOSIS — M1712 Unilateral primary osteoarthritis, left knee: Secondary | ICD-10-CM | POA: Diagnosis not present

## 2017-02-18 DIAGNOSIS — E669 Obesity, unspecified: Secondary | ICD-10-CM | POA: Diagnosis not present

## 2017-02-18 DIAGNOSIS — E119 Type 2 diabetes mellitus without complications: Secondary | ICD-10-CM | POA: Diagnosis not present

## 2017-02-18 DIAGNOSIS — Z885 Allergy status to narcotic agent status: Secondary | ICD-10-CM

## 2017-02-18 DIAGNOSIS — K219 Gastro-esophageal reflux disease without esophagitis: Secondary | ICD-10-CM | POA: Diagnosis not present

## 2017-02-18 DIAGNOSIS — E785 Hyperlipidemia, unspecified: Secondary | ICD-10-CM | POA: Diagnosis not present

## 2017-02-18 DIAGNOSIS — K7581 Nonalcoholic steatohepatitis (NASH): Secondary | ICD-10-CM | POA: Diagnosis present

## 2017-02-18 DIAGNOSIS — Z6835 Body mass index (BMI) 35.0-35.9, adult: Secondary | ICD-10-CM | POA: Diagnosis not present

## 2017-02-18 DIAGNOSIS — G8918 Other acute postprocedural pain: Secondary | ICD-10-CM | POA: Diagnosis not present

## 2017-02-18 HISTORY — PX: TOTAL KNEE ARTHROPLASTY: SHX125

## 2017-02-18 LAB — GLUCOSE, CAPILLARY
Glucose-Capillary: 163 mg/dL — ABNORMAL HIGH (ref 65–99)
Glucose-Capillary: 216 mg/dL — ABNORMAL HIGH (ref 65–99)
Glucose-Capillary: 240 mg/dL — ABNORMAL HIGH (ref 65–99)

## 2017-02-18 SURGERY — ARTHROPLASTY, KNEE, TOTAL
Anesthesia: Choice | Site: Knee | Laterality: Left

## 2017-02-18 MED ORDER — LACTATED RINGERS IV SOLN
INTRAVENOUS | Status: DC | PRN
  Administered 2017-02-18 (×2): via INTRAVENOUS

## 2017-02-18 MED ORDER — CEFAZOLIN SODIUM-DEXTROSE 2-4 GM/100ML-% IV SOLN
2.0000 g | Freq: Four times a day (QID) | INTRAVENOUS | Status: AC
  Administered 2017-02-18 – 2017-02-19 (×2): 2 g via INTRAVENOUS
  Filled 2017-02-18 (×2): qty 100

## 2017-02-18 MED ORDER — COLESTIPOL HCL 1 G PO TABS
1.0000 g | ORAL_TABLET | Freq: Every day | ORAL | Status: DC
  Administered 2017-02-19 – 2017-02-21 (×3): 1 g via ORAL
  Filled 2017-02-18 (×3): qty 1

## 2017-02-18 MED ORDER — ACETAMINOPHEN 325 MG PO TABS
650.0000 mg | ORAL_TABLET | Freq: Four times a day (QID) | ORAL | Status: DC | PRN
  Administered 2017-02-19 – 2017-02-20 (×2): 650 mg via ORAL
  Filled 2017-02-18 (×2): qty 2

## 2017-02-18 MED ORDER — ONDANSETRON HCL 4 MG/2ML IJ SOLN: 4.0000 mg | Freq: Once | INTRAMUSCULAR | Status: DC | PRN

## 2017-02-18 MED ORDER — LIDOCAINE 2% (20 MG/ML) 5 ML SYRINGE
INTRAMUSCULAR | Status: DC | PRN
  Administered 2017-02-18: 40 mg via INTRAVENOUS

## 2017-02-18 MED ORDER — FLUTICASONE PROPIONATE 50 MCG/ACT NA SUSP
2.0000 | Freq: Every day | NASAL | Status: DC | PRN
  Filled 2017-02-18: qty 16

## 2017-02-18 MED ORDER — DIPHENHYDRAMINE HCL 25 MG PO CAPS: 25.0000 mg | ORAL_CAPSULE | Freq: Four times a day (QID) | ORAL | Status: DC | PRN

## 2017-02-18 MED ORDER — FENTANYL CITRATE (PF) 100 MCG/2ML IJ SOLN
INTRAMUSCULAR | Status: DC | PRN
  Administered 2017-02-18: 100 ug via INTRAVENOUS
  Administered 2017-02-18: 50 ug via INTRAVENOUS
  Administered 2017-02-18: 100 ug via INTRAVENOUS

## 2017-02-18 MED ORDER — ONDANSETRON HCL 4 MG/2ML IJ SOLN
INTRAMUSCULAR | Status: DC | PRN
  Administered 2017-02-18: 4 mg via INTRAVENOUS

## 2017-02-18 MED ORDER — LOPERAMIDE HCL 2 MG PO CAPS: 4.0000 mg | ORAL_CAPSULE | Freq: Every day | ORAL | Status: DC | PRN

## 2017-02-18 MED ORDER — FENTANYL CITRATE (PF) 100 MCG/2ML IJ SOLN
INTRAMUSCULAR | Status: AC
  Administered 2017-02-18: 50 ug via INTRAVENOUS
  Filled 2017-02-18: qty 2

## 2017-02-18 MED ORDER — PROPOFOL 10 MG/ML IV BOLUS
INTRAVENOUS | Status: AC
  Filled 2017-02-18: qty 20

## 2017-02-18 MED ORDER — POLYETHYLENE GLYCOL 3350 17 G PO PACK: 17.0000 g | PACK | Freq: Every day | ORAL | Status: DC | PRN

## 2017-02-18 MED ORDER — SODIUM CHLORIDE 0.9 % IR SOLN
Status: DC | PRN
  Administered 2017-02-18: 3000 mL

## 2017-02-18 MED ORDER — INSULIN ASPART 100 UNIT/ML ~~LOC~~ SOLN
0.0000 [IU] | Freq: Every day | SUBCUTANEOUS | Status: DC
  Administered 2017-02-18 – 2017-02-19 (×2): 2 [IU] via SUBCUTANEOUS

## 2017-02-18 MED ORDER — GUAIFENESIN-CODEINE 100-10 MG/5ML PO SYRP: 10.0000 mL | ORAL_SOLUTION | Freq: Three times a day (TID) | ORAL | Status: DC | PRN

## 2017-02-18 MED ORDER — ASPIRIN 81 MG PO CHEW
81.0000 mg | CHEWABLE_TABLET | Freq: Two times a day (BID) | ORAL | Status: DC
  Administered 2017-02-18 – 2017-02-21 (×6): 81 mg via ORAL
  Filled 2017-02-18 (×6): qty 1

## 2017-02-18 MED ORDER — MENTHOL 3 MG MT LOZG: 1.0000 | LOZENGE | OROMUCOSAL | Status: DC | PRN

## 2017-02-18 MED ORDER — PROPOFOL 10 MG/ML IV BOLUS
INTRAVENOUS | Status: DC | PRN
  Administered 2017-02-18: 140 mg via INTRAVENOUS

## 2017-02-18 MED ORDER — OXYCODONE HCL 5 MG PO TABS
5.0000 mg | ORAL_TABLET | ORAL | Status: DC | PRN
  Administered 2017-02-18 – 2017-02-20 (×4): 10 mg via ORAL
  Filled 2017-02-18 (×5): qty 2

## 2017-02-18 MED ORDER — MIDAZOLAM HCL 2 MG/2ML IJ SOLN
INTRAMUSCULAR | Status: AC
  Administered 2017-02-18: 1 mg via INTRAVENOUS
  Filled 2017-02-18: qty 2

## 2017-02-18 MED ORDER — PANTOPRAZOLE SODIUM 40 MG PO TBEC
40.0000 mg | DELAYED_RELEASE_TABLET | Freq: Every day | ORAL | Status: DC
  Administered 2017-02-19 – 2017-02-21 (×3): 40 mg via ORAL
  Filled 2017-02-18 (×3): qty 1

## 2017-02-18 MED ORDER — BISACODYL 10 MG RE SUPP: 10.0000 mg | Freq: Every day | RECTAL | Status: DC | PRN

## 2017-02-18 MED ORDER — ONDANSETRON HCL 4 MG/2ML IJ SOLN
INTRAMUSCULAR | Status: AC
  Filled 2017-02-18: qty 2

## 2017-02-18 MED ORDER — FENTANYL CITRATE (PF) 100 MCG/2ML IJ SOLN
INTRAMUSCULAR | Status: AC
  Filled 2017-02-18: qty 2

## 2017-02-18 MED ORDER — LABETALOL HCL 5 MG/ML IV SOLN
INTRAVENOUS | Status: DC | PRN
  Administered 2017-02-18: 10 mg via INTRAVENOUS

## 2017-02-18 MED ORDER — SODIUM CHLORIDE 0.9 % IV SOLN
INTRAVENOUS | Status: DC
  Administered 2017-02-18 – 2017-02-19 (×2): via INTRAVENOUS

## 2017-02-18 MED ORDER — DIPHENHYDRAMINE HCL 25 MG PO TABS
25.0000 mg | ORAL_TABLET | Freq: Four times a day (QID) | ORAL | Status: DC | PRN
  Filled 2017-02-18: qty 1

## 2017-02-18 MED ORDER — MIDAZOLAM HCL 2 MG/2ML IJ SOLN
1.0000 mg | Freq: Once | INTRAMUSCULAR | Status: AC
  Administered 2017-02-18: 1 mg via INTRAVENOUS
  Filled 2017-02-18: qty 1

## 2017-02-18 MED ORDER — HYDROCORTISONE 2.5 % EX CREA: 1.0000 "application " | TOPICAL_CREAM | Freq: Two times a day (BID) | CUTANEOUS | Status: DC | PRN

## 2017-02-18 MED ORDER — ALBUTEROL SULFATE (2.5 MG/3ML) 0.083% IN NEBU: 3.0000 mL | INHALATION_SOLUTION | RESPIRATORY_TRACT | Status: DC | PRN

## 2017-02-18 MED ORDER — LACTATED RINGERS IV SOLN
Freq: Once | INTRAVENOUS | Status: AC
  Administered 2017-02-18: 11:00:00 via INTRAVENOUS

## 2017-02-18 MED ORDER — OXYCODONE-ACETAMINOPHEN 5-325 MG PO TABS: 1.0000 | ORAL_TABLET | ORAL | 0 refills | Status: DC | PRN

## 2017-02-18 MED ORDER — HYDROCHLOROTHIAZIDE 25 MG PO TABS
25.0000 mg | ORAL_TABLET | Freq: Every day | ORAL | Status: DC
  Administered 2017-02-19 – 2017-02-21 (×3): 25 mg via ORAL
  Filled 2017-02-18 (×3): qty 1

## 2017-02-18 MED ORDER — ONDANSETRON HCL 4 MG PO TABS
4.0000 mg | ORAL_TABLET | Freq: Four times a day (QID) | ORAL | Status: DC | PRN
  Administered 2017-02-20 (×2): 4 mg via ORAL
  Filled 2017-02-18 (×2): qty 1

## 2017-02-18 MED ORDER — PHENOL 1.4 % MT LIQD: 1.0000 | OROMUCOSAL | Status: DC | PRN

## 2017-02-18 MED ORDER — ROCURONIUM BROMIDE 10 MG/ML (PF) SYRINGE
PREFILLED_SYRINGE | INTRAVENOUS | Status: AC
  Filled 2017-02-18: qty 5

## 2017-02-18 MED ORDER — MIDAZOLAM HCL 2 MG/2ML IJ SOLN
INTRAMUSCULAR | Status: AC
  Filled 2017-02-18: qty 2

## 2017-02-18 MED ORDER — METOCLOPRAMIDE HCL 5 MG PO TABS: 5.0000 mg | ORAL_TABLET | Freq: Three times a day (TID) | ORAL | Status: DC | PRN

## 2017-02-18 MED ORDER — CHLORHEXIDINE GLUCONATE 4 % EX LIQD: 60.0000 mL | Freq: Once | CUTANEOUS | Status: DC

## 2017-02-18 MED ORDER — INSULIN ASPART 100 UNIT/ML ~~LOC~~ SOLN
4.0000 [IU] | Freq: Three times a day (TID) | SUBCUTANEOUS | Status: DC
  Administered 2017-02-19 – 2017-02-21 (×8): 4 [IU] via SUBCUTANEOUS

## 2017-02-18 MED ORDER — INSULIN ASPART 100 UNIT/ML ~~LOC~~ SOLN
0.0000 [IU] | Freq: Three times a day (TID) | SUBCUTANEOUS | Status: DC
  Administered 2017-02-19 – 2017-02-20 (×4): 5 [IU] via SUBCUTANEOUS
  Administered 2017-02-20 – 2017-02-21 (×4): 3 [IU] via SUBCUTANEOUS

## 2017-02-18 MED ORDER — ROCURONIUM BROMIDE 10 MG/ML (PF) SYRINGE
PREFILLED_SYRINGE | INTRAVENOUS | Status: DC | PRN
  Administered 2017-02-18: 50 mg via INTRAVENOUS

## 2017-02-18 MED ORDER — FENTANYL CITRATE (PF) 100 MCG/2ML IJ SOLN
25.0000 ug | INTRAMUSCULAR | Status: DC | PRN
  Administered 2017-02-18 (×4): 25 ug via INTRAVENOUS

## 2017-02-18 MED ORDER — DICYCLOMINE HCL 20 MG PO TABS: 20.0000 mg | ORAL_TABLET | Freq: Three times a day (TID) | ORAL | Status: DC | PRN

## 2017-02-18 MED ORDER — ONDANSETRON HCL 4 MG/2ML IJ SOLN
4.0000 mg | Freq: Four times a day (QID) | INTRAMUSCULAR | Status: DC | PRN
  Administered 2017-02-19: 4 mg via INTRAVENOUS
  Filled 2017-02-18: qty 2

## 2017-02-18 MED ORDER — LOSARTAN POTASSIUM 50 MG PO TABS
50.0000 mg | ORAL_TABLET | Freq: Every day | ORAL | Status: DC
  Administered 2017-02-18 – 2017-02-21 (×4): 50 mg via ORAL
  Filled 2017-02-18 (×4): qty 1

## 2017-02-18 MED ORDER — FERROUS SULFATE 325 (65 FE) MG PO TABS
325.0000 mg | ORAL_TABLET | Freq: Every day | ORAL | Status: DC
  Administered 2017-02-19 – 2017-02-21 (×3): 325 mg via ORAL
  Filled 2017-02-18 (×3): qty 1

## 2017-02-18 MED ORDER — ATORVASTATIN CALCIUM 40 MG PO TABS
40.0000 mg | ORAL_TABLET | Freq: Every day | ORAL | Status: DC
  Administered 2017-02-18 – 2017-02-20 (×3): 40 mg via ORAL
  Filled 2017-02-18 (×3): qty 1

## 2017-02-18 MED ORDER — TRANEXAMIC ACID 1000 MG/10ML IV SOLN
1000.0000 mg | Freq: Once | INTRAVENOUS | Status: AC
  Administered 2017-02-18: 1000 mg via INTRAVENOUS
  Filled 2017-02-18: qty 10

## 2017-02-18 MED ORDER — 0.9 % SODIUM CHLORIDE (POUR BTL) OPTIME
TOPICAL | Status: DC | PRN
  Administered 2017-02-18: 1000 mL

## 2017-02-18 MED ORDER — TRAMADOL HCL 50 MG PO TABS: 50.0000 mg | ORAL_TABLET | Freq: Four times a day (QID) | ORAL | Status: DC | PRN

## 2017-02-18 MED ORDER — SERTRALINE HCL 50 MG PO TABS
50.0000 mg | ORAL_TABLET | Freq: Every day | ORAL | Status: DC
  Administered 2017-02-19 – 2017-02-21 (×3): 50 mg via ORAL
  Filled 2017-02-18 (×3): qty 1

## 2017-02-18 MED ORDER — LINAGLIPTIN 5 MG PO TABS
5.0000 mg | ORAL_TABLET | Freq: Every day | ORAL | Status: DC
  Administered 2017-02-19 – 2017-02-21 (×3): 5 mg via ORAL
  Filled 2017-02-18 (×3): qty 1

## 2017-02-18 MED ORDER — ASPIRIN 81 MG PO CHEW: 81.0000 mg | CHEWABLE_TABLET | Freq: Two times a day (BID) | ORAL | 0 refills | Status: DC

## 2017-02-18 MED ORDER — FENTANYL CITRATE (PF) 100 MCG/2ML IJ SOLN
50.0000 ug | Freq: Once | INTRAMUSCULAR | Status: AC
  Administered 2017-02-18: 50 ug via INTRAVENOUS
  Filled 2017-02-18: qty 1

## 2017-02-18 MED ORDER — ALPRAZOLAM 0.5 MG PO TABS
0.5000 mg | ORAL_TABLET | Freq: Three times a day (TID) | ORAL | Status: DC | PRN
  Administered 2017-02-18 – 2017-02-20 (×3): 0.5 mg via ORAL
  Filled 2017-02-18 (×3): qty 1

## 2017-02-18 MED ORDER — DOCUSATE SODIUM 100 MG PO CAPS
100.0000 mg | ORAL_CAPSULE | Freq: Two times a day (BID) | ORAL | Status: DC
  Administered 2017-02-18 – 2017-02-21 (×6): 100 mg via ORAL
  Filled 2017-02-18 (×6): qty 1

## 2017-02-18 MED ORDER — ACETAMINOPHEN 650 MG RE SUPP: 650.0000 mg | Freq: Four times a day (QID) | RECTAL | Status: DC | PRN

## 2017-02-18 MED ORDER — FENTANYL CITRATE (PF) 250 MCG/5ML IJ SOLN
INTRAMUSCULAR | Status: AC
  Filled 2017-02-18: qty 5

## 2017-02-18 MED ORDER — MORPHINE SULFATE (PF) 4 MG/ML IV SOLN
2.0000 mg | INTRAVENOUS | Status: DC | PRN
  Administered 2017-02-18: 4 mg via INTRAVENOUS
  Filled 2017-02-18: qty 1

## 2017-02-18 MED ORDER — METOCLOPRAMIDE HCL 5 MG/ML IJ SOLN: 5.0000 mg | Freq: Three times a day (TID) | INTRAMUSCULAR | Status: DC | PRN

## 2017-02-18 MED ORDER — LIDOCAINE 2% (20 MG/ML) 5 ML SYRINGE
INTRAMUSCULAR | Status: AC
  Filled 2017-02-18: qty 5

## 2017-02-18 SURGICAL SUPPLY — 59 items
BANDAGE ESMARK 6X9 LF (GAUZE/BANDAGES/DRESSINGS) ×1 IMPLANT
BLADE SAG 18X100X1.27 (BLADE) ×2 IMPLANT
BLADE SAW SGTL 13X75X1.27 (BLADE) ×2 IMPLANT
BLADE SAW SGTL 18X1.27X75 (BLADE) ×2 IMPLANT
BNDG ELASTIC 6X10 VLCR STRL LF (GAUZE/BANDAGES/DRESSINGS) ×2 IMPLANT
BNDG ESMARK 6X9 LF (GAUZE/BANDAGES/DRESSINGS) ×2
BNDG GAUZE ELAST 4 BULKY (GAUZE/BANDAGES/DRESSINGS) ×2 IMPLANT
BOWL SMART MIX CTS (DISPOSABLE) ×2 IMPLANT
CAP KNEE TOTAL 3 SIGMA ×2 IMPLANT
CEMENT HV SMART SET (Cement) ×4 IMPLANT
COVER SURGICAL LIGHT HANDLE (MISCELLANEOUS) ×2 IMPLANT
CUFF TOURNIQUET SINGLE 34IN LL (TOURNIQUET CUFF) ×2 IMPLANT
CUFF TOURNIQUET SINGLE 44IN (TOURNIQUET CUFF) IMPLANT
DRAPE EXTREMITY T 121X128X90 (DRAPE) ×2 IMPLANT
DRAPE HALF SHEET 40X57 (DRAPES) ×2 IMPLANT
DRAPE U-SHAPE 47X51 STRL (DRAPES) ×2 IMPLANT
DRSG ADAPTIC 3X8 NADH LF (GAUZE/BANDAGES/DRESSINGS) ×2 IMPLANT
DRSG PAD ABDOMINAL 8X10 ST (GAUZE/BANDAGES/DRESSINGS) ×2 IMPLANT
DURAPREP 26ML APPLICATOR (WOUND CARE) ×2 IMPLANT
ELECT CAUTERY BLADE 6.4 (BLADE) IMPLANT
ELECT REM PT RETURN 9FT ADLT (ELECTROSURGICAL) ×2
ELECTRODE REM PT RTRN 9FT ADLT (ELECTROSURGICAL) ×1 IMPLANT
GAUZE SPONGE 4X4 12PLY STRL (GAUZE/BANDAGES/DRESSINGS) ×2 IMPLANT
GLOVE BIOGEL PI ORTHO PRO 7.5 (GLOVE) ×1
GLOVE BIOGEL PI ORTHO PRO SZ7 (GLOVE) ×1
GLOVE BIOGEL PI ORTHO PRO SZ8 (GLOVE) ×1
GLOVE ORTHO TXT STRL SZ7.5 (GLOVE) ×2 IMPLANT
GLOVE PI ORTHO PRO STRL 7.5 (GLOVE) ×1 IMPLANT
GLOVE PI ORTHO PRO STRL SZ7 (GLOVE) ×1 IMPLANT
GLOVE PI ORTHO PRO STRL SZ8 (GLOVE) ×1 IMPLANT
GLOVE SURG ORTHO 8.5 STRL (GLOVE) ×2 IMPLANT
GOWN STRL REUS W/ TWL XL LVL3 (GOWN DISPOSABLE) ×3 IMPLANT
GOWN STRL REUS W/TWL XL LVL3 (GOWN DISPOSABLE) ×3
HANDPIECE INTERPULSE COAX TIP (DISPOSABLE) ×1
IMMOBILIZER KNEE 22 UNIV (SOFTGOODS) ×2 IMPLANT
KIT BASIN OR (CUSTOM PROCEDURE TRAY) ×2 IMPLANT
KIT MANIFOLD (MISCELLANEOUS) ×2 IMPLANT
KIT ROOM TURNOVER OR (KITS) ×2 IMPLANT
MANIFOLD NEPTUNE II (INSTRUMENTS) ×2 IMPLANT
NS IRRIG 1000ML POUR BTL (IV SOLUTION) ×2 IMPLANT
PACK TOTAL JOINT (CUSTOM PROCEDURE TRAY) ×2 IMPLANT
PAD ARMBOARD 7.5X6 YLW CONV (MISCELLANEOUS) ×4 IMPLANT
SET HNDPC FAN SPRY TIP SCT (DISPOSABLE) ×1 IMPLANT
STRIP CLOSURE SKIN 1/2X4 (GAUZE/BANDAGES/DRESSINGS) ×4 IMPLANT
SUCTION FRAZIER HANDLE 10FR (MISCELLANEOUS) ×1
SUCTION TUBE FRAZIER 10FR DISP (MISCELLANEOUS) ×1 IMPLANT
SUT MNCRL AB 3-0 PS2 18 (SUTURE) ×2 IMPLANT
SUT VIC AB 0 CT1 27 (SUTURE) ×2
SUT VIC AB 0 CT1 27XBRD ANBCTR (SUTURE) ×2 IMPLANT
SUT VIC AB 1 CT1 27 (SUTURE) ×2
SUT VIC AB 1 CT1 27XBRD ANBCTR (SUTURE) ×2 IMPLANT
SUT VIC AB 2-0 CT1 27 (SUTURE) ×2
SUT VIC AB 2-0 CT1 TAPERPNT 27 (SUTURE) ×2 IMPLANT
TOWEL OR 17X24 6PK STRL BLUE (TOWEL DISPOSABLE) ×2 IMPLANT
TOWEL OR 17X26 10 PK STRL BLUE (TOWEL DISPOSABLE) ×2 IMPLANT
TRAY CATH 16FR W/PLASTIC CATH (SET/KITS/TRAYS/PACK) IMPLANT
TRAY FOLEY W/METER SILVER 16FR (SET/KITS/TRAYS/PACK) IMPLANT
WATER STERILE IRR 1000ML POUR (IV SOLUTION) ×2 IMPLANT
YANKAUER SUCT BULB TIP NO VENT (SUCTIONS) ×2 IMPLANT

## 2017-02-18 NOTE — Anesthesia Preprocedure Evaluation (Addendum)
Anesthesia Evaluation  Patient identified by MRN, date of birth, ID band Patient awake    Reviewed: Allergy & Precautions, NPO status , Patient's Chart, lab work & pertinent test results  Airway Mallampati: II  TM Distance: >3 FB Neck ROM: Full    Dental  (+) Teeth Intact, Dental Advisory Given, Caps   Pulmonary asthma , former smoker,    breath sounds clear to auscultation       Cardiovascular hypertension, Pt. on medications  Rhythm:Regular Rate:Normal     Neuro/Psych    GI/Hepatic   Endo/Other  diabetes  Renal/GU      Musculoskeletal   Abdominal (+) + obese,   Peds  Hematology   Anesthesia Other Findings   Reproductive/Obstetrics                            Anesthesia Physical Anesthesia Plan  ASA: III  Anesthesia Plan: General   Post-op Pain Management:    Induction: Intravenous  PONV Risk Score and Plan: 3 and Ondansetron, Dexamethasone, Propofol, Midazolam and Treatment may vary due to age or medical condition  Airway Management Planned:   Additional Equipment:   Intra-op Plan:   Post-operative Plan:   Informed Consent:   Plan Discussed with:   Anesthesia Plan Comments:        Anesthesia Quick Evaluation

## 2017-02-18 NOTE — Transfer of Care (Signed)
Immediate Anesthesia Transfer of Care Note  Patient: KENTRELL GUETTLER  Procedure(s) Performed: Procedure(s): LEFT TOTAL KNEE ARTHROPLASTY (Left)  Patient Location: PACU  Anesthesia Type:GA combined with regional for post-op pain  Level of Consciousness: awake and patient cooperative  Airway & Oxygen Therapy: Patient Spontanous Breathing and Patient connected to nasal cannula oxygen  Post-op Assessment: Report given to RN, Post -op Vital signs reviewed and stable and Patient moving all extremities  Post vital signs: Reviewed and stable  Last Vitals:  Vitals:   02/18/17 1210 02/18/17 1215  BP: 90/74 (!) 131/58  Pulse: 87 91  Resp: 15 18  Temp:      Last Pain:  Vitals:   02/18/17 1215  TempSrc:   PainSc: 0-No pain      Patients Stated Pain Goal: 2 (74/71/85 5015)  Complications: No apparent anesthesia complications

## 2017-02-18 NOTE — Progress Notes (Signed)
Orthopedic Tech Progress Note Patient Details:  Rachael Buck [birth year] [id number]  Ortho Devices Type of Ortho Device: Other (comment) Ortho Device/Splint Location: placed on cpm at 0-60 degrees on pt left knee.  pt tolerated well.  Bone foam provided and placed at bedside.  (pt alread had knee immobilizer in place at this time.)      Kristopher Oppenheim 02/18/2017, 11:07 PM

## 2017-02-18 NOTE — Anesthesia Postprocedure Evaluation (Signed)
Anesthesia Post Note  Patient: Rachael Buck  Procedure(s) Performed: Procedure(s) (LRB): LEFT TOTAL KNEE ARTHROPLASTY (Left)     Patient location during evaluation: PACU Anesthesia Type: General Level of consciousness: awake and alert Pain management: pain level controlled Vital Signs Assessment: post-procedure vital signs reviewed and stable Respiratory status: spontaneous breathing, nonlabored ventilation, respiratory function stable and patient connected to nasal cannula oxygen Cardiovascular status: blood pressure returned to baseline and stable Postop Assessment: no signs of nausea or vomiting Anesthetic complications: no    Last Vitals:  Vitals:   02/18/17 1700 02/18/17 1736  BP:  (!) 172/68  Pulse: 86 87  Resp: 17 18  Temp: 36.3 C 37.2 C    Last Pain:  Vitals:   02/18/17 1736  TempSrc: Oral  PainSc:                  [name]

## 2017-02-18 NOTE — Anesthesia Preprocedure Evaluation (Signed)
Anesthesia Evaluation  Patient identified by MRN, date of birth, ID band Patient awake    Reviewed: Allergy & Precautions, NPO status , Patient's Chart, lab work & pertinent test results  Airway        Dental   Pulmonary former smoker,           Cardiovascular hypertension,      Neuro/Psych    GI/Hepatic   Endo/Other  diabetes  Renal/GU      Musculoskeletal   Abdominal   Peds  Hematology   Anesthesia Other Findings   Reproductive/Obstetrics                             Anesthesia Physical Anesthesia Plan  ASA: III  Anesthesia Plan: General   Post-op Pain Management:  Regional for Post-op pain   Induction: Intravenous  PONV Risk Score and Plan: Ondansetron  Airway Management Planned: Oral ETT  Additional Equipment:   Intra-op Plan:   Post-operative Plan: Extubation in OR  Informed Consent: I have reviewed the patients History and Physical, chart, labs and discussed the procedure including the risks, benefits and alternatives for the proposed anesthesia with the patient or authorized representative who has indicated his/her understanding and acceptance.   Dental advisory given  Plan Discussed with: CRNA and Anesthesiologist  Anesthesia Plan Comments:         Anesthesia Quick Evaluation

## 2017-02-18 NOTE — Anesthesia Procedure Notes (Signed)
Anesthesia Regional Block: Adductor canal block   Pre-Anesthetic Checklist: ,, timeout performed, Correct Patient, Correct Site, Correct Laterality, Correct Procedure, Correct Position, site marked, Risks and benefits discussed,  Surgical consent,  Pre-op evaluation,  At surgeon's request and post-op pain management  Laterality: Left  Prep: chloraprep       Needles:  Injection technique: Single-shot  Needle Type: Echogenic Stimulator Needle          Additional Needles:   Procedures: ultrasound guided,,,,,,,,  Narrative:  Start time: 02/18/2017 11:50 AM End time: 02/18/2017 11:55 AM Injection made incrementally with aspirations every 5 mL.  Performed by: Personally  Anesthesiologist: Daneesha Quinteros  Additional Notes: 30 cc 0.5% Bupivacaine injected easily

## 2017-02-18 NOTE — Anesthesia Procedure Notes (Signed)
Procedure Name: Intubation Date/Time: 02/18/2017 12:59 PM Performed by: Suzy Bouchard Pre-anesthesia Checklist: Patient identified, Emergency Drugs available, Suction available, Patient being monitored and Timeout performed Patient Re-evaluated:Patient Re-evaluated prior to inductionOxygen Delivery Method: Circle system utilized Preoxygenation: Pre-oxygenation with 100% oxygen Intubation Type: IV induction Ventilation: Mask ventilation without difficulty and Oral airway inserted - appropriate to patient size Laryngoscope Size: Sabra Heck and 2 Grade View: Grade I Tube type: Oral Tube size: 7.5 mm Number of attempts: 1 Airway Equipment and Method: Stylet Placement Confirmation: ETT inserted through vocal cords under direct vision,  positive ETCO2 and breath sounds checked- equal and bilateral Secured at: 21 cm Tube secured with: Tape Dental Injury: Teeth and Oropharynx as per pre-operative assessment

## 2017-02-18 NOTE — Brief Op Note (Signed)
02/18/2017  3:24 PM  PATIENT:  Rachael Buck  76 y.o. female  PRE-OPERATIVE DIAGNOSIS:  Left knee osteoarthritis, end stage  POST-OPERATIVE DIAGNOSIS:  Left knee osteoarthritis, end stage  PROCEDURE:  Procedure(s): LEFT TOTAL KNEE ARTHROPLASTY (Left) DePuy Sigma RP  SURGEON:  Surgeon(s) and Role:    Netta Cedars, MD - Primary  PHYSICIAN ASSISTANT:   ASSISTANTS: Ventura Bruns, PA-C   ANESTHESIA:   regional and general  EBL:  Total I/O In: 1000 [I.V.:1000] Out: 230 [Urine:200; Blood:30]  BLOOD ADMINISTERED:none  DRAINS: none   LOCAL MEDICATIONS USED:  NONE     SPECIMEN:  No Specimen  DISPOSITION OF SPECIMEN:  N/A  COUNTS:  YES  TOURNIQUET:   Total Tourniquet Time Documented: Thigh (Left) - 117 minutes Total: Thigh (Left) - 117 minutes   DICTATION: .Other Dictation: Dictation Number [id number]  PLAN OF CARE: Admit to inpatient   PATIENT DISPOSITION:  PACU - hemodynamically stable.   Delay start of Pharmacological VTE agent (>24hrs) due to surgical blood loss or risk of bleeding: no

## 2017-02-18 NOTE — Discharge Instructions (Signed)
Please make sure that you have your knee high TED hose to go home with.  Those must be worn 24/7 and the Baby aspirin taken twice daily to prevent blood clots.  Please keep the knee incision covered and clean and dry for one week, then ok to get it wet in the shower.  Do exercises every two hours for the knee.  DO NOT prop anything under the knee, only the ankle so the knee goes straight.  Wear the knee immobilizer with the knee as straight as you can get it each night to maintain extension.  Weight bearing as tolerated on the left knee/leg.  Follow up with Dr Veverly Fells in the office in two weeks [phone]

## 2017-02-18 NOTE — Interval H&P Note (Signed)
History and Physical Interval Note:  02/18/2017 12:20 PM  Rachael Buck  has presented today for surgery, with the diagnosis of Left knee osteoarthritis  The various methods of treatment have been discussed with the patient and family. After consideration of risks, benefits and other options for treatment, the patient has consented to  Procedure(s): LEFT TOTAL KNEE ARTHROPLASTY (Left) as a surgical intervention .  The patient's history has been reviewed, patient examined, no change in status, stable for surgery.  I have reviewed the patient's chart and labs.  Questions were answered to the patient's satisfaction.     [name],STEVEN R

## 2017-02-19 LAB — GLUCOSE, CAPILLARY
Glucose-Capillary: 229 mg/dL — ABNORMAL HIGH (ref 65–99)
Glucose-Capillary: 233 mg/dL — ABNORMAL HIGH (ref 65–99)
Glucose-Capillary: 205 mg/dL — ABNORMAL HIGH (ref 65–99)
Glucose-Capillary: 207 mg/dL — ABNORMAL HIGH (ref 65–99)

## 2017-02-19 LAB — CBC
HCT: 31.4 % — ABNORMAL LOW (ref 36.0–46.0)
Hemoglobin: 10.2 g/dL — ABNORMAL LOW (ref 12.0–15.0)
MCH: 27.5 pg (ref 26.0–34.0)
MCHC: 32.5 g/dL (ref 30.0–36.0)
MCV: 84.6 fL (ref 78.0–100.0)
Platelets: 76 10*3/uL — ABNORMAL LOW (ref 150–400)
RBC: 3.71 MIL/uL — ABNORMAL LOW (ref 3.87–5.11)
RDW: 17.4 % — ABNORMAL HIGH (ref 11.5–15.5)
WBC: 10 10*3/uL (ref 4.0–10.5)

## 2017-02-19 LAB — BASIC METABOLIC PANEL
Anion gap: 9 (ref 5–15)
BUN: 5 mg/dL — ABNORMAL LOW (ref 6–20)
CO2: 30 mmol/L (ref 22–32)
Calcium: 8.5 mg/dL — ABNORMAL LOW (ref 8.9–10.3)
Chloride: 95 mmol/L — ABNORMAL LOW (ref 101–111)
Creatinine, Ser: 0.59 mg/dL (ref 0.44–1.00)
GFR calc Af Amer: >60 mL/min (ref >60)
GFR calc non Af Amer: >60 mL/min (ref >60)
Glucose, Bld: 204 mg/dL — ABNORMAL HIGH (ref 65–99)
Potassium: 3.7 mmol/L (ref 3.5–5.1)
Sodium: 134 mmol/L — ABNORMAL LOW (ref 135–145)

## 2017-02-19 NOTE — Evaluation (Signed)
Physical Therapy Evaluation Patient Details Name: Rachael Buck MRN: [id number] DOB: Jan 29, 1941 Today's Date: 02/19/2017   History of Present Illness  Pt presents for L TKA with PMH: OA, cirrhosis, DM, GERD.   Clinical Impression  Pt is s/p TKA resulting in the deficits listed below (see PT Problem List). Pt mobility limited on eval by dizziness due to pt not receiving dinner or breakfast trays. Pivoted to [facility] and then to recliner with min A and left in zero knee foam in chair. Will plan to progress ambulation in afternoon when she has eaten.  Pt will benefit from skilled PT to increase their independence and safety with mobility to allow discharge to the venue listed below.      Follow Up Recommendations Home health PT;Supervision for mobility/OOB    Equipment Recommendations  None recommended by PT    Recommendations for Other Services       Precautions / Restrictions Precautions Precautions: Fall Restrictions Weight Bearing Restrictions: Yes LLE Weight Bearing: Weight bearing as tolerated      Mobility  Bed Mobility Overal bed mobility: Needs Assistance Bed Mobility: Supine to Sit     Supine to sit: Mod assist     General bed mobility comments: vc's for rolling and use of rail to right, mod A to LLE and at hips for bringing to EOB  Transfers Overall transfer level: Needs assistance Equipment used: Rolling walker (2 wheeled) Transfers: Sit to/from [facility] Sit to Stand: Min assist Stand pivot transfers: Min assist       General transfer comment: vc's for hand placement, pt transferred bed to [facility] and BSC to recliner with pivot steps, decreased knee control LLE but pt able to compensate with use of UE's on RW  Ambulation/Gait             General Gait Details: pt had not had dinner or breakfast due to NPO orders not being changed so was very dizzy when she got up so unable to ambulate safely  Stairs            Wheelchair  Mobility    Modified Rankin (Stroke Patients Only)       Balance Overall balance assessment: Needs assistance Sitting-balance support: Single extremity supported Sitting balance-Leahy Scale: Fair     Standing balance support: Bilateral upper extremity supported Standing balance-Leahy Scale: Poor Standing balance comment: reliant on UE support                             Pertinent Vitals/Pain Pain Assessment: 0-10 Pain Score: 6  Pain Location: left knee Pain Descriptors / Indicators: Aching Pain Intervention(s): Limited activity within patient's tolerance;Monitored during session;Patient requesting pain meds-RN notified    Home Living Family/patient expects to be discharged to:: Private residence Living Arrangements: Spouse/significant other Available Help at Discharge: Family;Available 24 hours/day Type of Home: House Home Access: Stairs to enter Entrance Stairs-Rails: Right Entrance Stairs-Number of Steps: 4 Home Layout: One level Home Equipment: Walker - 2 wheels;Shower seat      Prior Function Level of Independence: Independent               Hand Dominance        Extremity/Trunk Assessment   Upper Extremity Assessment Upper Extremity Assessment: Generalized weakness    Lower Extremity Assessment Lower Extremity Assessment: LLE deficits/detail LLE Deficits / Details: hip flex 2-/5, knee ext 2/5, knee flex 3/5    Cervical / Trunk Assessment Cervical /  Trunk Assessment: Kyphotic  Communication   Communication: No difficulties  Cognition Arousal/Alertness: Awake/alert Behavior During Therapy: WFL for tasks assessed/performed Overall Cognitive Status: Within Functional Limits for tasks assessed                                        General Comments General comments (skin integrity, edema, etc.): O2 sats 89% on RA after session, 2 L O2 reapplied, sats into mid 90's, HR 110 bpm with activity    Exercises Total Joint  Exercises Ankle Circles/Pumps: AROM;Both;10 reps;Seated;Supine Quad Sets: AROM;Both;10 reps;Seated Gluteal Sets: AROM;Both;10 reps;Seated Heel Slides: AAROM;Left;5 reps;Supine Long Arc Quad: AROM;Left;10 reps;Seated Goniometric ROM: 20-80   Assessment/Plan    PT Assessment Patient needs continued PT services  PT Problem List Decreased strength;Decreased range of motion;Decreased activity tolerance;Decreased balance;Decreased mobility;Decreased knowledge of use of DME;Decreased knowledge of precautions;Pain       PT Treatment Interventions DME instruction;Gait training;Stair training;Functional mobility training;Therapeutic activities;Therapeutic exercise;Balance training;Neuromuscular re-education;Patient/family education    PT Goals (Current goals can be found in the Care Plan section)  Acute Rehab PT Goals Patient Stated Goal: return home PT Goal Formulation: With patient Time For Goal Achievement: 02/26/17 Potential to Achieve Goals: Good    Frequency 7X/week   Barriers to discharge        Co-evaluation               AM-PAC PT "6 Clicks" Daily Activity  Outcome Measure Difficulty turning over in bed (including adjusting bedclothes, sheets and blankets)?: Total Difficulty moving from lying on back to sitting on the side of the bed? : Total Difficulty sitting down on and standing up from a chair with arms (e.g., wheelchair, bedside commode, etc,.)?: Total Help needed moving to and from a bed to chair (including a wheelchair)?: A Little Help needed walking in hospital room?: A Little Help needed climbing 3-5 steps with a railing? : A Lot 6 Click Score: 11    End of Session Equipment Utilized During Treatment: Gait belt;Oxygen Activity Tolerance: Treatment limited secondary to medical complications (Comment) (dizziness) Patient left: in chair;with call bell/phone within reach;with nursing/sitter in room Nurse Communication: Mobility status PT Visit Diagnosis:  Pain;Difficulty in walking, not elsewhere classified (R26.2);Muscle weakness (generalized) (M62.81) Pain - Right/Left: Left Pain - part of body: Knee    Time: 2130-8657 PT Time Calculation (min) (ACUTE ONLY): 36 min   Charges:   PT Evaluation $PT Eval Moderate Complexity: 1 Procedure PT Treatments $Therapeutic Activity: 8-22 mins   PT G Codes:        Leighton Roach, PT  Acute Rehab Services  [place] 02/19/2017, 10:09 AM

## 2017-02-19 NOTE — Evaluation (Signed)
Occupational Therapy Evaluation Patient Details Name: Rachael Buck MRN: [id number] DOB: 10-Dec-1940 Today's Date: 02/19/2017    History of Present Illness Pt presents for L TKA with PMH: OA, cirrhosis, DM, GERD.    Clinical Impression   PTA, pt was living with her husband and was independent. Currently, pt requires Min A for ADLs and functional mobility using RW. Pt reports feeling nauseous throughout session limiting her functional performance. Provided education on LB ADLs; pt demonstrated understanding and donned underwear with Min A. Pt would benefit from acute OT to increase safety and independence with ADLs and functional mobility. Recommend dc home once medically stable per physician.      Follow Up Recommendations  DC plan and follow up therapy as arranged by surgeon;Supervision/Assistance - 24 hour    Equipment Recommendations  None recommended by OT    Recommendations for Other Services PT consult     Precautions / Restrictions Precautions Precautions: Fall Restrictions Weight Bearing Restrictions: Yes LLE Weight Bearing: Weight bearing as tolerated      Mobility Bed Mobility Overal bed mobility: Needs Assistance Bed Mobility: Supine to Sit;Sit to Supine     Supine to sit: Min assist;HOB elevated Sit to supine: Min assist   General bed mobility comments: Min A to transition LLE towards EOB. Min A to roll into sidelying and facilitate trunk into sitting position. Pt requires Min A to bring LLE back into back when transitioning from EOB to supine  Transfers Overall transfer level: Needs assistance Equipment used: Rolling walker (2 wheeled) Transfers: Sit to/from Stand Sit to Stand: Min guard         General transfer comment: min-guard A for safety with standing during LB dressing    Balance Overall balance assessment: Needs assistance Sitting-balance support: Single extremity supported Sitting balance-Leahy Scale: Fair Sitting balance - Comments:  pt able to maintain balance sitting EOB but has difficulty wt shifting   Standing balance support: Bilateral upper extremity supported Standing balance-Leahy Scale: Fair Standing balance comment: Pt able to perform LB dressing                            ADL either performed or assessed with clinical judgement   ADL Overall ADL's : Needs assistance/impaired Eating/Feeding: Set up;Sitting   Grooming: Set up;Sitting   Upper Body Bathing: Set up;Sitting   Lower Body Bathing: Minimal assistance;Sit to/from stand   Upper Body Dressing : Set up;Sitting   Lower Body Dressing: Minimal assistance;Sit to/from stand Lower Body Dressing Details (indicate cue type and reason): Pt donned underwear wiht Min A to get over L foot. Min A for standing balance when pulling waistband over hips.                     Vision         Perception     Praxis      Pertinent Vitals/Pain Pain Assessment: 0-10 Pain Score: 6  Pain Location: left knee Pain Descriptors / Indicators: Aching Pain Intervention(s): Monitored during session;Limited activity within patient's tolerance;Repositioned     Hand Dominance Right   Extremity/Trunk Assessment Upper Extremity Assessment Upper Extremity Assessment: Generalized weakness   Lower Extremity Assessment Lower Extremity Assessment: Defer to PT evaluation   Cervical / Trunk Assessment Cervical / Trunk Assessment: Kyphotic   Communication Communication Communication: No difficulties   Cognition Arousal/Alertness: Awake/alert Behavior During Therapy: WFL for tasks assessed/performed Overall Cognitive Status: Within Functional Limits for tasks assessed  General Comments  On roomair, O2 sats sitting EOB 92%; supine 96%. Pt reports feeling nauseous thorughout session    Exercises    Shoulder Instructions      Home Living Family/patient expects to be discharged to:: Private  residence Living Arrangements: Spouse/significant other Available Help at Discharge: Family;Available 24 hours/day Type of Home: House Home Access: Stairs to enter [employer] of Steps: 4 Entrance Stairs-Rails: Right Home Layout: One level     Bathroom Shower/Tub: Occupational psychologist: Handicapped height     Home Equipment: Environmental consultant - 2 wheels;Shower seat;Grab bars - tub/shower;Hand held shower head          Prior Functioning/Environment Level of Independence: Independent                 OT Problem List: Decreased strength;Decreased range of motion;Decreased activity tolerance;Impaired balance (sitting and/or standing);Pain;Decreased safety awareness;Decreased knowledge of use of DME or AE;Decreased knowledge of precautions      OT Treatment/Interventions: Self-care/ADL training;Therapeutic exercise;Energy conservation;DME and/or AE instruction;Therapeutic activities;Patient/family education    OT Goals(Current goals can be found in the care plan section) Acute Rehab OT Goals Patient Stated Goal: return home OT Goal Formulation: With patient Time For Goal Achievement: 03/05/17 Potential to Achieve Goals: Good ADL Goals Pt Will Perform Grooming: with set-up;with supervision;standing Pt Will Perform Lower Body Dressing: with min guard assist;sit to/from stand Pt Will Transfer to Toilet: with min guard assist;bedside commode;ambulating Pt Will Perform Toileting - Clothing Manipulation and hygiene: with min guard assist;sit to/from stand Pt Will Perform Tub/Shower Transfer: with min assist;ambulating;rolling walker;shower seat;Shower transfer  OT Frequency: Min 2X/week   Barriers to D/C:            Co-evaluation              AM-PAC PT "6 Clicks" Daily Activity     Outcome Measure Help from another person eating meals?: None Help from another person taking care of personal grooming?: A Little Help from another person toileting, which  includes using toliet, bedpan, or urinal?: A Little Help from another person bathing (including washing, rinsing, drying)?: A Little Help from another person to put on and taking off regular upper body clothing?: None Help from another person to put on and taking off regular lower body clothing?: A Little 6 Click Score: 20   End of Session Equipment Utilized During Treatment: Gait belt;Rolling walker CPM Left Knee CPM Left Knee: On Left Knee Flexion (Degrees): 75 Left Knee Extension (Degrees): 0 Additional Comments: placed on cpm at 0-60 degrees on pt left knee.  pt tolerated well.  Bone foam provided and placed at bedside.  (pt alread had knee immobilizer in place at this time.)    Nurse Communication: Mobility status;Weight bearing status;Precautions  Activity Tolerance: Patient tolerated treatment well;Other (comment) (Limited by nausea) Patient left: in bed;with call bell/phone within reach;with family/visitor present  OT Visit Diagnosis: Unsteadiness on feet (R26.81);Other abnormalities of gait and mobility (R26.89);Muscle weakness (generalized) (M62.81);Pain Pain - Right/Left: Left Pain - part of body: Knee                Time: 4650-3546 OT Time Calculation (min): 22 min Charges:  OT General Charges $OT Visit: 1 Procedure OT Evaluation $OT Eval Low Complexity: 1 Procedure G-Codes:     Macaela Presas MSOT, OTR/L Acute Rehab Pager: [phone] Office: [place] 02/19/2017, 3:46 PM

## 2017-02-19 NOTE — Progress Notes (Signed)
Physical Therapy Treatment Patient Details Name: Rachael Buck MRN: [id number] DOB: [birth year] Today's Date: 02/19/2017    History of Present Illness Pt presents for L TKA with PMH: OA, cirrhosis, DM, GERD.     PT Comments    Pt doing better after receiving food, was able to ambulate 24' with RW and min A. Left in CPM 0-75 degrees. PT will continue to follow.    Follow Up Recommendations  Home health PT;Supervision for mobility/OOB     Equipment Recommendations  None recommended by PT    Recommendations for Other Services       Precautions / Restrictions Precautions Precautions: Fall Restrictions Weight Bearing Restrictions: Yes LLE Weight Bearing: Weight bearing as tolerated    Mobility  Bed Mobility Overal bed mobility: Needs Assistance Bed Mobility: Supine to Sit;Sit to Supine     Supine to sit: Mod assist Sit to supine: Mod assist   General bed mobility comments: mod A for movement of hips in and out of bed.  Transfers Overall transfer level: Needs assistance Equipment used: Rolling walker (2 wheeled) Transfers: Sit to/from Stand Sit to Stand: Min guard Stand pivot transfers: Min assist       General transfer comment: min-guard A for safety with standing from bed, min-guard from toilet with heavy use of rails  Ambulation/Gait Ambulation/Gait assistance: Min assist Ambulation Distance (Feet): 24 Feet Assistive device: Rolling walker (2 wheeled) Gait Pattern/deviations: Step-through pattern;Decreased stride length;Decreased weight shift to left;Decreased stance time - right Gait velocity: decreased Gait velocity interpretation: <1.8 ft/sec, indicative of risk for recurrent falls General Gait Details: left knee maintained in flexion, cues for getting left heel to floor   Stairs            Wheelchair Mobility    Modified Rankin (Stroke Patients Only)       Balance Overall balance assessment: Needs assistance Sitting-balance support:  Single extremity supported Sitting balance-Leahy Scale: Fair Sitting balance - Comments: pt able to maintain balance sitting EOB but has difficulty wt shifting   Standing balance support: Bilateral upper extremity supported Standing balance-Leahy Scale: Poor Standing balance comment: reliant on UE support                            Cognition Arousal/Alertness: Awake/alert Behavior During Therapy: WFL for tasks assessed/performed Overall Cognitive Status: Within Functional Limits for tasks assessed                                        Exercises Total Joint Exercises Ankle Circles/Pumps: AROM;Both;10 reps;Supine Quad Sets: AROM;Both;10 reps;Supine Gluteal Sets: AROM;Both;10 reps;Supine Heel Slides: AAROM;Left;5 reps;Supine Long Arc Quad: AROM;Left;10 reps;Seated Goniometric ROM: 20-80    General Comments General comments (skin integrity, edema, etc.): O2 sats 94% on RA      Pertinent Vitals/Pain Pain Assessment: 0-10 Pain Score: 5  Pain Location: left knee Pain Descriptors / Indicators: Aching Pain Intervention(s): Limited activity within patient's tolerance;Monitored during session;Premedicated before session    Home Living Family/patient expects to be discharged to:: Private residence Living Arrangements: Spouse/significant other Available Help at Discharge: Family;Available 24 hours/day Type of Home: House Home Access: Stairs to enter Entrance Stairs-Rails: Right Home Layout: One level Home Equipment: Walker - 2 wheels;Shower seat      Prior Function Level of Independence: Independent          PT Goals (current  goals can now be found in the care plan section) Acute Rehab PT Goals Patient Stated Goal: return home PT Goal Formulation: With patient Time For Goal Achievement: 02/26/17 Potential to Achieve Goals: Good Progress towards PT goals: Progressing toward goals    Frequency    7X/week      PT Plan Current plan  remains appropriate    Co-evaluation              AM-PAC PT "6 Clicks" Daily Activity  Outcome Measure  Difficulty turning over in bed (including adjusting bedclothes, sheets and blankets)?: Total Difficulty moving from lying on back to sitting on the side of the bed? : Total Difficulty sitting down on and standing up from a chair with arms (e.g., wheelchair, bedside commode, etc,.)?: Total Help needed moving to and from a bed to chair (including a wheelchair)?: A Little Help needed walking in hospital room?: A Little Help needed climbing 3-5 steps with a railing? : A Lot 6 Click Score: 11    End of Session Equipment Utilized During Treatment: Gait belt Activity Tolerance: Patient tolerated treatment well Patient left: with call bell/phone within reach;in bed;in CPM;with family/visitor present Nurse Communication: Mobility status PT Visit Diagnosis: Pain;Difficulty in walking, not elsewhere classified (R26.2);Muscle weakness (generalized) (M62.81) Pain - Right/Left: Left Pain - part of body: Knee     Time: 1220-1250 PT Time Calculation (min) (ACUTE ONLY): 30 min  Charges:  $Gait Training: 8-22 mins $Therapeutic Activity: 8-22 mins                    G Codes:       [place]  [place] 02/19/2017, 1:51 PM

## 2017-02-19 NOTE — Progress Notes (Signed)
    Subjective: 1 Day Post-Op Procedure(s) (LRB): LEFT TOTAL KNEE ARTHROPLASTY (Left) Patient reports pain as 4 on 0-10 scale.   Denies CP or SOB.  Voiding without difficulty. Positive flatus. Objective: Vital signs in last 24 hours: Temp:  [97.4 F (36.3 C)-99.7 F (37.6 C)] 99.7 F (37.6 C) (06/23 0523) Pulse Rate:  [80-108] 108 (06/23 0523) Resp:  [9-23] 18 (06/23 0523) BP: (90-173)/(31-93) 151/69 (06/23 0523) SpO2:  [95 %-99 %] 96 % (06/23 0523) Weight:  [96.6 kg (213 lb)] 96.6 kg (213 lb) (06/22 1101)  Intake/Output from previous day: 06/22 0701 - 06/23 0700 In: 1635.8 [I.V.:1435.8; IV Piggyback:200] Out: 1480 [Urine:1450; Blood:30] Intake/Output this shift: No intake/output data recorded.  Labs:  Recent Labs  02/19/17 0421  HGB 10.2*    Recent Labs  02/19/17 0421  WBC 10.0  RBC 3.71*  HCT 31.4*  PLT 76*    Recent Labs  02/19/17 0421  NA 134*  K 3.7  CL 95*  CO2 30  BUN 5*  CREATININE 0.59  GLUCOSE 204*  CALCIUM 8.5*   No results for input(s): LABPT, INR in the last 72 hours.  Physical Exam: Neurologically intact ABD soft Intact pulses distally Incision: dressing C/D/I Compartment soft  Assessment/Plan: 1 Day Post-Op Procedure(s) (LRB): LEFT TOTAL KNEE ARTHROPLASTY (Left) Advance diet Up with therapy  Stable, pain improving Plan on d/c Sunday Dressing change tomorrow  Melina Schools D for Dr. Melina Schools [facility] [phone] 02/19/2017, 7:33 AM

## 2017-02-20 LAB — CBC
HCT: 30 % — ABNORMAL LOW (ref 36.0–46.0)
Hemoglobin: 10 g/dL — ABNORMAL LOW (ref 12.0–15.0)
MCH: 27.9 pg (ref 26.0–34.0)
MCHC: 33.3 g/dL (ref 30.0–36.0)
MCV: 83.8 fL (ref 78.0–100.0)
Platelets: 72 10*3/uL — ABNORMAL LOW (ref 150–400)
RBC: 3.58 MIL/uL — ABNORMAL LOW (ref 3.87–5.11)
RDW: 17.4 % — ABNORMAL HIGH (ref 11.5–15.5)
WBC: 12.6 10*3/uL — ABNORMAL HIGH (ref 4.0–10.5)

## 2017-02-20 LAB — GLUCOSE, CAPILLARY
Glucose-Capillary: 217 mg/dL — ABNORMAL HIGH (ref 65–99)
Glucose-Capillary: 176 mg/dL — ABNORMAL HIGH (ref 65–99)
Glucose-Capillary: 178 mg/dL — ABNORMAL HIGH (ref 65–99)
Glucose-Capillary: 189 mg/dL — ABNORMAL HIGH (ref 65–99)

## 2017-02-20 NOTE — Progress Notes (Signed)
NT found a small white pill in the patient's bed. The pill had a M on one side and 315 on the other side. RN discarded of pill in sharps container. RN had second RN ([place]) witness.

## 2017-02-20 NOTE — Progress Notes (Signed)
Physical Therapy Treatment Patient Details Name: Rachael Buck MRN: [id number] DOB: 06-11-1941 Today's Date: 02/20/2017    History of Present Illness Pt presents for L TKA with PMH: OA, cirrhosis, DM, GERD.     PT Comments    Patient limited by nausea this session however was able to safely negotiate step and complete rest of HEP. Continue to progress as tolerated with anticipated d/c home with HHPT.   Follow Up Recommendations  Home health PT;Supervision for mobility/OOB     Equipment Recommendations  None recommended by PT    Recommendations for Other Services       Precautions / Restrictions Precautions Precautions: Fall Restrictions Weight Bearing Restrictions: Yes LLE Weight Bearing: Weight bearing as tolerated    Mobility  Bed Mobility Overal bed mobility: Needs Assistance Bed Mobility: Sit to Supine;Supine to Sit     Supine to sit: Min guard Sit to supine: Min guard;Min assist   General bed mobility comments: assist to bring L LE into bed  Transfers Overall transfer level: Needs assistance Equipment used: Rolling walker (2 wheeled) Transfers: Sit to/from Stand Sit to Stand: Supervision         General transfer comment: supervision for safety; demo of safe hand placement   Ambulation/Gait Ambulation/Gait assistance: Supervision Ambulation Distance (Feet): 20 Feet Assistive device: Rolling walker (2 wheeled) Gait Pattern/deviations: Step-through pattern;Decreased stride length;Decreased weight shift to left;Decreased stance time - left;Antalgic Gait velocity: decreased   General Gait Details: cues for posture and L heel strike   Stairs Stairs: Yes   Stair Management: No rails;Step to pattern;Backwards;With walker Number of Stairs: 1 General stair comments: cues for sequencing; assist to stabilize RW  Wheelchair Mobility    Modified Rankin (Stroke Patients Only)       Balance Overall balance assessment: Needs  assistance Sitting-balance support: Feet supported Sitting balance-Leahy Scale: Good Sitting balance - Comments: pt able to maintain balance sitting EOB but has difficulty wt shifting   Standing balance support: Bilateral upper extremity supported Standing balance-Leahy Scale: Poor Standing balance comment: reliant on UE support                            Cognition Arousal/Alertness: Awake/alert Behavior During Therapy: WFL for tasks assessed/performed Overall Cognitive Status: Within Functional Limits for tasks assessed                                        Exercises Total Joint Exercises Hip ABduction/ADduction: AROM;Left;10 reps Straight Leg Raises: AAROM;Left;5 reps Long Arc Quad: AAROM;Left;10 reps;Seated Knee Flexion: AROM;Left;5 reps;Seated;Other (comment) (10 sec holds)    General Comments        Pertinent Vitals/Pain Pain Assessment: Faces Faces Pain Scale: Hurts even more Pain Location: left knee with therex Pain Descriptors / Indicators: Aching;Grimacing;Guarding Pain Intervention(s): Limited activity within patient's tolerance;Monitored during session;Premedicated before session;Repositioned    Home Living                      Prior Function            PT Goals (current goals can now be found in the care plan section) Acute Rehab PT Goals Patient Stated Goal: return home PT Goal Formulation: With patient Time For Goal Achievement: 02/26/17 Potential to Achieve Goals: Good Progress towards PT goals: Progressing toward goals    Frequency  7X/week      PT Plan Current plan remains appropriate    Co-evaluation              AM-PAC PT "6 Clicks" Daily Activity  Outcome Measure  Difficulty turning over in bed (including adjusting bedclothes, sheets and blankets)?: Total Difficulty moving from lying on back to sitting on the side of the bed? : Total Difficulty sitting down on and standing up from a  chair with arms (e.g., wheelchair, bedside commode, etc,.)?: A Lot Help needed moving to and from a bed to chair (including a wheelchair)?: A Little Help needed walking in hospital room?: A Little Help needed climbing 3-5 steps with a railing? : A Little 6 Click Score: 13    End of Session Equipment Utilized During Treatment: Gait belt Activity Tolerance: Other (comment) (limited by nausea--RN notified) Patient left: with call bell/phone within reach;in bed;with family/visitor present Nurse Communication: Mobility status PT Visit Diagnosis: Pain;Difficulty in walking, not elsewhere classified (R26.2);Muscle weakness (generalized) (M62.81) Pain - Right/Left: Left Pain - part of body: Knee     Time: 7793-9030 PT Time Calculation (min) (ACUTE ONLY): 27 min  Charges:  $Gait Training: 8-22 mins $Therapeutic Exercise: 8-22 mins                    G Codes:       Earney Navy, PTA Pager: [phone]     Darliss Cheney 02/20/2017, 2:55 PM

## 2017-02-20 NOTE — Progress Notes (Addendum)
Rachael Buck  MRN: [id number] DOB/Age: 1941-03-06 76 y.o. Physician: Veverly Fells Procedure: Procedure(s) (LRB): LEFT TOTAL KNEE ARTHROPLASTY (Left)     Subjective: Up in chair. Pain a little better today. Ambulating short distances only. Voiding well, no bm but postive flatus   Vital Signs Temp:  [99.6 F (37.6 C)-101 F (38.3 C)] 101 F (38.3 C) (06/24 0500) Pulse Rate:  [98-106] 106 (06/24 0500) Resp:  [18-22] 22 (06/24 0500) BP: (151-153)/(58-67) 151/67 (06/24 0500) SpO2:  [92 %-96 %] 94 % (06/24 0500)  Lab Results  Recent Labs  02/19/17 0421 02/20/17 0347  WBC 10.0 12.6*  HGB 10.2* 10.0*  HCT 31.4* 30.0*  PLT 76* 72*   BMET  Recent Labs  02/19/17 0421  NA 134*  K 3.7  CL 95*  CO2 30  GLUCOSE 204*  BUN 5*  CREATININE 0.59  CALCIUM 8.5*   INR  Date Value Ref Range Status  02/14/2017 1.2 0.8 - 1.2 Final    Comment:                   Reference interval is for non-anticoagulated patients.                Suggested INR therapeutic range for Vitamin K                antagonist therapy:                   Standard Dose (moderate intensity                                  therapeutic range):       2.0 - 3.0                   Higher intensity therapeutic range       2.5 - 3.5      Exam Dressing removed and new mepilex applied. Incision dry and no active bleeding  NVI        Plan Continue to mobilize. [place] PA-C   02/20/2017, 8:57 AM Contact # [phone]  Agree with above assessment and plan.  No calf swelling.  No cords. Neg Homan's.  TED hose not on yet.  I have spoken with nursing concerning this and they will place this morning. D/C planned for tomorrow.   Continue PT, OT

## 2017-02-20 NOTE — Progress Notes (Signed)
Occupational Therapy Treatment Patient Details Name: Rachael Buck MRN: [id number] DOB: 1941/02/07 Today's Date: 02/20/2017    History of present illness Pt presents for L TKA with PMH: OA, cirrhosis, DM, GERD.    OT comments  Pt progressing towards established goals. Provided education on walk-in shower transfer; pt performed with Min A for safety. Pt continues to require physical A for LB dressing and feel she may benefit from education on AE. Continue to recommend dc home. Will continue to follow acutely to facilitate safe dc.    Follow Up Recommendations  DC plan and follow up therapy as arranged by surgeon;Supervision/Assistance - 24 hour    Equipment Recommendations  None recommended by OT    Recommendations for Other Services PT consult    Precautions / Restrictions Precautions Precautions: Fall Restrictions Weight Bearing Restrictions: Yes LLE Weight Bearing: Weight bearing as tolerated       Mobility Bed Mobility Overal bed mobility: Needs Assistance Bed Mobility: Sit to Supine     Supine to sit: HOB elevated;Min guard Sit to supine: Min assist   General bed mobility comments: Min A to elevate BLE over EOB; pt utilizing bedrails throughout  Transfers Overall transfer level: Needs assistance Equipment used: Rolling walker (2 wheeled) Transfers: Sit to/from Stand Sit to Stand: Supervision         General transfer comment: supervision for safety; demo of safe hand placement     Balance Overall balance assessment: Needs assistance Sitting-balance support: Feet supported Sitting balance-Leahy Scale: Good Sitting balance - Comments: pt able to maintain balance sitting EOB but has difficulty wt shifting   Standing balance support: Bilateral upper extremity supported Standing balance-Leahy Scale: Poor Standing balance comment: reliant on UE support                           ADL either performed or assessed with clinical judgement   ADL  Overall ADL's : Needs assistance/impaired                     Lower Body Dressing: Minimal assistance;Sit to/from stand;Maximal assistance Lower Body Dressing Details (indicate cue type and reason): Requires Max A to don socks. Pt attempted but unable to perform task due to pain. Feel she may benefit from AE         Tub/ Shower Transfer: Walk-in shower;Minimal assistance;Ambulation;Shower seat;Cueing for Architectural technologist Details (indicate cue type and reason): Pt demonstrated understanding of shower transfer. Min A for safety.  Functional mobility during ADLs: Min guard;Rolling walker General ADL Comments: Pt stating she was "worn out" byt PT earlier. Continues to have decreased activity tolerance.      Vision       Perception     Praxis      Cognition Arousal/Alertness: Awake/alert Behavior During Therapy: WFL for tasks assessed/performed Overall Cognitive Status: Within Functional Limits for tasks assessed                                          Exercises    Shoulder Instructions       General Comments      Pertinent Vitals/ Pain       Pain Assessment: Faces Pain Score: 5  Faces Pain Scale: Hurts even more Pain Location: left knee Pain Descriptors / Indicators: Aching;Grimacing;Guarding Pain Intervention(s): Limited activity within patient's tolerance;Monitored during  session;Repositioned  Home Living                                          Prior Functioning/Environment              Frequency  Min 2X/week        Progress Toward Goals  OT Goals(current goals can now be found in the care plan section)  Progress towards OT goals: Progressing toward goals  Acute Rehab OT Goals Patient Stated Goal: return home OT Goal Formulation: With patient Time For Goal Achievement: 03/05/17 Potential to Achieve Goals: Good ADL Goals Pt Will Perform Grooming: with set-up;with  supervision;standing Pt Will Perform Lower Body Dressing: with min guard assist;sit to/from stand Pt Will Transfer to Toilet: with min guard assist;bedside commode;ambulating Pt Will Perform Toileting - Clothing Manipulation and hygiene: with min guard assist;sit to/from stand Pt Will Perform Tub/Shower Transfer: with min assist;ambulating;rolling walker;shower seat;Shower transfer  Plan Discharge plan remains appropriate    Co-evaluation                 AM-PAC PT "6 Clicks" Daily Activity     Outcome Measure   Help from another person eating meals?: None Help from another person taking care of personal grooming?: A Little Help from another person toileting, which includes using toliet, bedpan, or urinal?: A Little Help from another person bathing (including washing, rinsing, drying)?: A Little Help from another person to put on and taking off regular upper body clothing?: None Help from another person to put on and taking off regular lower body clothing?: A Little 6 Click Score: 20    End of Session Equipment Utilized During Treatment: Gait belt;Rolling walker CPM Left Knee CPM Left Knee: Off Left Knee Flexion (Degrees): 60 Left Knee Extension (Degrees): 0  OT Visit Diagnosis: Unsteadiness on feet (R26.81);Other abnormalities of gait and mobility (R26.89);Muscle weakness (generalized) (M62.81);Pain Pain - Right/Left: Left Pain - part of body: Knee   Activity Tolerance Patient tolerated treatment well   Patient Left in bed;with call bell/phone within reach   Nurse Communication Mobility status;Weight bearing status;Precautions        Time: 1015-1030 OT Time Calculation (min): 15 min  Charges: OT General Charges $OT Visit: 1 Procedure OT Treatments $Self Care/Home Management : 8-22 mins  [place], OTR/L Acute Rehab Pager: [phone] Office: [place] 02/20/2017, 12:17 PM

## 2017-02-20 NOTE — Progress Notes (Signed)
Physical Therapy Treatment Patient Details Name: Rachael Buck MRN: [id number] DOB: [birth year] Today's Date: 02/20/2017    History of Present Illness Pt presents for L TKA with PMH: OA, cirrhosis, DM, GERD.     PT Comments    Patient is progressing toward mobility goals. Pt tolerated increased gait distance and therex this session. Practice step next session. Current plan remains appropriate.    Follow Up Recommendations  Home health PT;Supervision for mobility/OOB     Equipment Recommendations  None recommended by PT    Recommendations for Other Services       Precautions / Restrictions Precautions Precautions: Fall Restrictions Weight Bearing Restrictions: Yes LLE Weight Bearing: Weight bearing as tolerated    Mobility  Bed Mobility Overal bed mobility: Needs Assistance Bed Mobility: Sit to Supine       Sit to supine: Min guard   General bed mobility comments: min guard for safety; increased time and effort; cues for technique  Transfers Overall transfer level: Needs assistance Equipment used: Rolling walker (2 wheeled) Transfers: Sit to/from Stand Sit to Stand: Supervision         General transfer comment: supervision for safety; demo of safe hand placement   Ambulation/Gait Ambulation/Gait assistance: Supervision;Min guard Ambulation Distance (Feet): 100 Feet Assistive device: Rolling walker (2 wheeled) Gait Pattern/deviations: Step-through pattern;Decreased stride length;Decreased weight shift to left;Decreased stance time - left;Antalgic Gait velocity: decreased   General Gait Details: cues for L heel strike and L quad activation during stance phase; pt with improved weight bearing and step length symmetry with increased distance   Stairs            Wheelchair Mobility    Modified Rankin (Stroke Patients Only)       Balance Overall balance assessment: Needs assistance Sitting-balance support: Feet supported Sitting balance-Leahy  Scale: Good     Standing balance support: Bilateral upper extremity supported Standing balance-Leahy Scale: Poor Standing balance comment: reliant on UE support                            Cognition Arousal/Alertness: Awake/alert Behavior During Therapy: WFL for tasks assessed/performed Overall Cognitive Status: Within Functional Limits for tasks assessed                                        Exercises Total Joint Exercises Ankle Circles/Pumps: AROM;Both;10 reps Quad Sets: AROM;Both;10 reps Short Arc QuadSinclair Ship;Left;10 reps Heel Slides: AAROM;Left;10 reps Straight Leg Raises: AAROM;Left;10 reps Goniometric ROM: 10-85    General Comments        Pertinent Vitals/Pain Pain Assessment: 0-10 Pain Score: 5  Pain Location: left knee Pain Descriptors / Indicators: Aching;Grimacing;Guarding Pain Intervention(s): Limited activity within patient's tolerance;Monitored during session;Premedicated before session;Repositioned    Home Living                      Prior Function            PT Goals (current goals can now be found in the care plan section) Acute Rehab PT Goals Patient Stated Goal: return home PT Goal Formulation: With patient Time For Goal Achievement: 02/26/17 Potential to Achieve Goals: Good Progress towards PT goals: Progressing toward goals    Frequency    7X/week      PT Plan Current plan remains appropriate    Co-evaluation  AM-PAC PT "6 Clicks" Daily Activity  Outcome Measure  Difficulty turning over in bed (including adjusting bedclothes, sheets and blankets)?: Total Difficulty moving from lying on back to sitting on the side of the bed? : Total Difficulty sitting down on and standing up from a chair with arms (e.g., wheelchair, bedside commode, etc,.)?: A Lot Help needed moving to and from a bed to chair (including a wheelchair)?: A Little Help needed walking in hospital room?: A  Little Help needed climbing 3-5 steps with a railing? : A Little 6 Click Score: 13    End of Session Equipment Utilized During Treatment: Gait belt Activity Tolerance: Patient tolerated treatment well Patient left: with call bell/phone within reach;in bed Nurse Communication: Mobility status PT Visit Diagnosis: Pain;Difficulty in walking, not elsewhere classified (R26.2);Muscle weakness (generalized) (M62.81) Pain - Right/Left: Left Pain - part of body: Knee     Time: 6433-2951 PT Time Calculation (min) (ACUTE ONLY): 40 min  Charges:  $Gait Training: 8-22 mins $Therapeutic Exercise: 8-22 mins $Therapeutic Activity: 8-22 mins                    G Codes:       Earney Navy, PTA Pager: [phone]     Darliss Cheney 02/20/2017, 9:49 AM

## 2017-02-21 ENCOUNTER — Encounter (HOSPITAL_COMMUNITY): Payer: Self-pay | Admitting: Orthopedic Surgery

## 2017-02-21 LAB — CBC
HCT: 29.3 % — ABNORMAL LOW (ref 36.0–46.0)
Hemoglobin: 9.6 g/dL — ABNORMAL LOW (ref 12.0–15.0)
MCH: 27.4 pg (ref 26.0–34.0)
MCHC: 32.8 g/dL (ref 30.0–36.0)
MCV: 83.5 fL (ref 78.0–100.0)
Platelets: 83 10*3/uL — ABNORMAL LOW (ref 150–400)
RBC: 3.51 MIL/uL — ABNORMAL LOW (ref 3.87–5.11)
RDW: 17.6 % — ABNORMAL HIGH (ref 11.5–15.5)
WBC: 11.6 10*3/uL — ABNORMAL HIGH (ref 4.0–10.5)

## 2017-02-21 LAB — GLUCOSE, CAPILLARY
Glucose-Capillary: 187 mg/dL — ABNORMAL HIGH (ref 65–99)
Glucose-Capillary: 158 mg/dL — ABNORMAL HIGH (ref 65–99)

## 2017-02-21 MED ORDER — BISACODYL 10 MG RE SUPP
10.0000 mg | Freq: Once | RECTAL | Status: DC
  Filled 2017-02-21: qty 1

## 2017-02-21 NOTE — Discharge Summary (Signed)
Physician Discharge Summary   Patient ID: Rachael Buck MRN: [id number] DOB/AGE: [birth year] 76 y.o.  Admit date: 02/18/2017 Discharge date: 02/21/2017  Admission Diagnoses:  Active Problems:   S/P total knee replacement, left   Discharge Diagnoses:  Same   Surgeries: Procedure(s): LEFT TOTAL KNEE ARTHROPLASTY on 02/18/2017   Consultants: PT, OT  Discharged Condition: Stable  Hospital Course: Rachael Buck is an 76 y.o. female who was admitted 02/18/2017 with a chief complaint of left knee pain, and found to have a diagnosis of left knee primary OA, end stage.  They were brought to the operating room on 02/18/2017 and underwent the above named procedures.    The patient had an uncomplicated hospital course and was stable for discharge.  Recent vital signs:  Vitals:   02/20/17 2101 02/21/17 0402  BP: (!) 132/57 117/60  Pulse: (!) 104 99  Resp: 20 18  Temp: 99.7 F (37.6 C) 98.2 F (36.8 C)    Recent laboratory studies:  Results for orders placed or performed during the hospital encounter of 02/18/17  Glucose, capillary  Result Value Ref Range   Glucose-Capillary 163 (H) 65 - 99 mg/dL   Comment 1 Notify RN    Comment 2 Document in Chart   Glucose, capillary  Result Value Ref Range   Glucose-Capillary 216 (H) 65 - 99 mg/dL  CBC  Result Value Ref Range   WBC 10.0 4.0 - 10.5 K/uL   RBC 3.71 (L) 3.87 - 5.11 MIL/uL   Hemoglobin 10.2 (L) 12.0 - 15.0 g/dL   HCT 31.4 (L) 36.0 - 46.0 %   MCV 84.6 78.0 - 100.0 fL   MCH 27.5 26.0 - 34.0 pg   MCHC 32.5 30.0 - 36.0 g/dL   RDW 17.4 (H) 11.5 - 15.5 %   Platelets 76 (L) 150 - 400 K/uL  Basic metabolic panel  Result Value Ref Range   Sodium 134 (L) 135 - 145 mmol/L   Potassium 3.7 3.5 - 5.1 mmol/L   Chloride 95 (L) 101 - 111 mmol/L   CO2 30 22 - 32 mmol/L   Glucose, Bld 204 (H) 65 - 99 mg/dL   BUN 5 (L) 6 - 20 mg/dL   Creatinine, Ser 0.59 0.44 - 1.00 mg/dL   Calcium 8.5 (L) 8.9 - 10.3 mg/dL   GFR calc non Af Amer  >60 >60 mL/min   GFR calc Af Amer >60 >60 mL/min   Anion gap 9 5 - 15  Glucose, capillary  Result Value Ref Range   Glucose-Capillary 240 (H) 65 - 99 mg/dL  Glucose, capillary  Result Value Ref Range   Glucose-Capillary 229 (H) 65 - 99 mg/dL  Glucose, capillary  Result Value Ref Range   Glucose-Capillary 233 (H) 65 - 99 mg/dL   Comment 1 Notify RN    Comment 2 Document in Chart   Glucose, capillary  Result Value Ref Range   Glucose-Capillary 205 (H) 65 - 99 mg/dL   Comment 1 Notify RN    Comment 2 Document in Chart   CBC  Result Value Ref Range   WBC 12.6 (H) 4.0 - 10.5 K/uL   RBC 3.58 (L) 3.87 - 5.11 MIL/uL   Hemoglobin 10.0 (L) 12.0 - 15.0 g/dL   HCT 30.0 (L) 36.0 - 46.0 %   MCV 83.8 78.0 - 100.0 fL   MCH 27.9 26.0 - 34.0 pg   MCHC 33.3 30.0 - 36.0 g/dL   RDW 17.4 (H) 11.5 - 15.5 %  Platelets 72 (L) 150 - 400 K/uL  Glucose, capillary  Result Value Ref Range   Glucose-Capillary 207 (H) 65 - 99 mg/dL  Glucose, capillary  Result Value Ref Range   Glucose-Capillary 217 (H) 65 - 99 mg/dL  Glucose, capillary  Result Value Ref Range   Glucose-Capillary 176 (H) 65 - 99 mg/dL  Glucose, capillary  Result Value Ref Range   Glucose-Capillary 178 (H) 65 - 99 mg/dL  CBC  Result Value Ref Range   WBC PENDING 4.0 - 10.5 K/uL   RBC 3.51 (L) 3.87 - 5.11 MIL/uL   Hemoglobin 9.6 (L) 12.0 - 15.0 g/dL   HCT 29.3 (L) 36.0 - 46.0 %   MCV 83.5 78.0 - 100.0 fL   MCH 27.4 26.0 - 34.0 pg   MCHC 32.8 30.0 - 36.0 g/dL   RDW 17.6 (H) 11.5 - 15.5 %   Platelets 83 (L) 150 - 400 K/uL  Glucose, capillary  Result Value Ref Range   Glucose-Capillary 189 (H) 65 - 99 mg/dL  Glucose, capillary  Result Value Ref Range   Glucose-Capillary 187 (H) 65 - 99 mg/dL    Discharge Medications:   Allergies as of 02/21/2017      Reactions   Codeine Nausea And Vomiting   Shrimp [shellfish Allergy] Swelling      Medication List    TAKE these medications   albuterol 108 (90 Base) MCG/ACT  inhaler Commonly known as:  PROAIR HFA Inhale 2 puffs into the lungs every 4 (four) hours as needed for wheezing or shortness of breath.   ALPRAZolam 0.5 MG tablet Commonly known as:  XANAX TAKE 1 TABLET BY MOUTH THREE TIMES DAILY AS NEEDED FOR ANIETY OR PAIN What changed:  See the new instructions.   aspirin 81 MG chewable tablet Commonly known as:  ASPIRIN CHILDRENS Chew 1 tablet (81 mg total) by mouth 2 (two) times daily.   azithromycin 250 MG tablet Commonly known as:  ZITHROMAX 2 tabs on day 1, 1 tab on day 2-5   colestipol 1 g tablet Commonly known as:  COLESTID Take 1 g by mouth daily.   dicyclomine 20 MG tablet Commonly known as:  BENTYL TAKE 1 TABLET BY MOUTH THREE TIMES DAILY AS NEEDED What changed:  See the new instructions.   diphenhydrAMINE 25 MG tablet Commonly known as:  BENADRYL Take 25 mg by mouth every 6 (six) hours as needed (for allergies.).   fenofibrate 160 MG tablet Take 1 tablet (160 mg total) by mouth daily.   ferrous sulfate 325 (65 FE) MG tablet Take 1 tablet (325 mg total) by mouth daily with breakfast.   fluticasone 50 MCG/ACT nasal spray Commonly known as:  FLONASE Place 2 sprays into both nostrils daily. What changed:  when to take this  reasons to take this   guaiFENesin-codeine 100-10 MG/5ML syrup Commonly known as:  ROBITUSSIN AC Take 10 mLs by mouth 3 (three) times daily as needed for cough.   hydrochlorothiazide 25 MG tablet Commonly known as:  HYDRODIURIL TAKE 1 TABLET BY MOUTH EVERY DAY   hydrocortisone 2.5 % cream Apply 1 application topically 2 (two) times daily as needed (for eczema).   loperamide 2 MG capsule Commonly known as:  IMODIUM Take 4-6 mg by mouth daily as needed (for IBS symptoms).   losartan 50 MG tablet Commonly known as:  COZAAR TAKE 1 TABLET BY MOUTH EVERY DAY   omeprazole 40 MG capsule Commonly known as:  PRILOSEC TAKE 1 CAPSULE BY MOUTH EVERY DAY  ONE TOUCH ULTRA TEST test strip Generic  drug:  glucose blood USE AS DIRECTED What changed:  See the new instructions.   ONETOUCH DELICA LANCETS FINE Misc 1 Stick by Does not apply route 2 (two) times daily. What changed:  how much to take  how to take this  when to take this   oxyCODONE-acetaminophen 5-325 MG tablet Commonly known as:  ROXICET Take 1-2 tablets by mouth every 4 (four) hours as needed for severe pain.   sertraline 50 MG tablet Commonly known as:  ZOLOFT TAKE 1 TABLET BY MOUTH EVERY DAY   simvastatin 80 MG tablet Commonly known as:  ZOCOR TAKE 1 TABLET BY MOUTH EVERY DAY   sitaGLIPtin 100 MG tablet Commonly known as:  JANUVIA Take 1 tablet (100 mg total) by mouth daily.   traMADol 50 MG tablet Commonly known as:  ULTRAM Take 50 mg by mouth every 4 (four) hours as needed. For pain.       Diagnostic Studies: Dg Knee Left Port  Result Date: 02/18/2017 CLINICAL DATA:  Status post total knee replacement. EXAM: PORTABLE LEFT KNEE - 1-2 VIEW COMPARISON:  None. FINDINGS: The femoral and tibial components are well situated. Moderate suprapatellar joint effusion is noted. Expected postoperative changes are seen in the surrounding soft tissues. IMPRESSION: Status post left total knee arthroplasty. Electronically Signed   By: Marijo Conception, M.D.   On: 02/18/2017 17:18    Disposition: 01-Home or Self Care  Discharge Instructions    Call MD / Call 911    Complete by:  As directed    If you experience chest pain or shortness of breath, CALL 911 and be transported to the hospital emergency room.  If you develope a fever above 101 F, pus (white drainage) or increased drainage or redness at the wound, or calf pain, call your surgeon's office.   Constipation Prevention    Complete by:  As directed    Drink plenty of fluids.  Prune juice may be helpful.  You may use a stool softener, such as Colace (over the counter) 100 mg twice a day.  Use MiraLax (over the counter) for constipation as needed.   Diet - low  sodium heart healthy    Complete by:  As directed    Driving restrictions    Complete by:  As directed    No driving for 2 weeks   Increase activity slowly as tolerated    Complete by:  As directed       Follow-up Information    Rachael Cedars, MD. Call in 2 weeks.   Specialty:  Orthopedic Surgery Why:  [phone] Contact information: [address] [place] [address] [phone]            Signed: Augustin Schooling 02/21/2017, 6:50 AM

## 2017-02-21 NOTE — Care Management Note (Signed)
Case Management Note  Patient Details  Name: Rachael Buck MRN: [id number] Date of Birth: 02-Apr-1941  Subjective/Objective:   76 yr old female s/p left total knee arthroplasty.                 Action/Plan: Case manager spoke with patient and her husband concerning discharge plan and DME needs. Patient was preoperatively setup with [facility], no changes. She has RW and 3in1. Patient says Dr. Veverly Fells informed her she will not use CPM at discharge. She will have family support at home.    Expected Discharge Date:  02/21/17               Expected Discharge Plan:  [place]  In-House Referral:  NA  Discharge planning Services  CM Consult  Post Acute Care Choice:  Home Health Choice offered to:  Patient, Spouse  DME Arranged:  N/A DME Agency:  NA  HH Arranged:  PT [place] Agency:  [facility] (formerly [employer])  Status of Service:  Completed, signed off  If discussed at [employer], dates discussed:    Additional Comments:  Ninfa Meeker, RN 02/21/2017, 10:26 AM

## 2017-02-21 NOTE — Progress Notes (Signed)
Occupational Therapy Treatment Patient Details Name: Rachael Buck MRN: [id number] DOB: Mar 14, 1941 Today's Date: 02/21/2017    History of present illness Pt presents for L TKA with PMH: OA, cirrhosis, DM, GERD.    OT comments  Pt progressing towards established goals. Provided education on LB dressing with AE. Pt demonstrated understanding and performed LB dressing with Min Guard for safety in standing. Provided education and answered pt and family questions in preparation for dc today. All acute OT needs met. Will sign off.    Follow Up Recommendations  DC plan and follow up therapy as arranged by surgeon;Supervision/Assistance - 24 hour    Equipment Recommendations  None recommended by OT    Recommendations for Other Services PT consult    Precautions / Restrictions Precautions Precautions: Fall Restrictions Weight Bearing Restrictions: Yes LLE Weight Bearing: Weight bearing as tolerated       Mobility Bed Mobility Overal bed mobility: Needs Assistance Bed Mobility: Sit to Supine;Supine to Sit     Supine to sit: Supervision Sit to supine: Min assist   General bed mobility comments: Pt use of bedrails to get in and out of bed. Min A for return to supine to lift LLE over EOB  Transfers Overall transfer level: Needs assistance Equipment used: Rolling walker (2 wheeled) Transfers: Sit to/from Stand Sit to Stand: Supervision         General transfer comment: supervision for safety; demo of safe hand placement     Balance Overall balance assessment: Needs assistance Sitting-balance support: Feet supported Sitting balance-Leahy Scale: Good Sitting balance - Comments: pt able to maintain balance sitting EOB but has difficulty wt shifting   Standing balance support: Bilateral upper extremity supported Standing balance-Leahy Scale: Poor Standing balance comment: reliant on UE support                           ADL either performed or assessed with  clinical judgement   ADL Overall ADL's : Needs assistance/impaired                 Upper Body Dressing : Set up;Sitting   Lower Body Dressing: Sit to/from stand;Min guard;With adaptive equipment Lower Body Dressing Details (indicate cue type and reason): Provided education on AE. Pt able to perform LB dressing with AE and Min guard for safety in standing.                General ADL Comments: Pt demonstrating understanding of education. Husband present and verbalized uncderstanding as well.      Vision       Perception     Praxis      Cognition Arousal/Alertness: Awake/alert Behavior During Therapy: WFL for tasks assessed/performed Overall Cognitive Status: Within Functional Limits for tasks assessed                                          Exercises    Shoulder Instructions       General Comments Husband present throughout session    Pertinent Vitals/ Pain       Pain Assessment: Faces Faces Pain Scale: Hurts even more Pain Location: left knee with therex Pain Descriptors / Indicators: Aching;Grimacing;Guarding Pain Intervention(s): Monitored during session;Limited activity within patient's tolerance;Repositioned  Home Living Family/patient expects to be discharged to:: Private residence Living Arrangements: Spouse/significant other Available Help at Discharge: Family;Available 24  hours/day Type of Home: House Home Access: Stairs to enter [employer] of Steps: 4 Entrance Stairs-Rails: Right Home Layout: One level     Bathroom Shower/Tub: Occupational psychologist: Handicapped height     Home Equipment: Environmental consultant - 2 wheels;Shower seat;Grab bars - tub/shower;Hand held shower head          Prior Functioning/Environment Level of Independence: Independent            Frequency  Min 2X/week        Progress Toward Goals  OT Goals(current goals can now be found in the care plan section)  Progress  towards OT goals: Progressing toward goals  Acute Rehab OT Goals Patient Stated Goal: return home OT Goal Formulation: With patient Time For Goal Achievement: 03/05/17 Potential to Achieve Goals: Good ADL Goals Pt Will Perform Grooming: with set-up;with supervision;standing Pt Will Perform Lower Body Dressing: with adaptive equipment;with min guard assist;sit to/from stand Pt Will Transfer to Toilet: with min guard assist;bedside commode;ambulating Pt Will Perform Toileting - Clothing Manipulation and hygiene: with min guard assist;sit to/from stand Pt Will Perform Tub/Shower Transfer: with min assist;ambulating;rolling walker;shower seat;Shower transfer  Plan Discharge plan remains appropriate    Co-evaluation                 AM-PAC PT "6 Clicks" Daily Activity     Outcome Measure   Help from another person eating meals?: None Help from another person taking care of personal grooming?: A Little Help from another person toileting, which includes using toliet, bedpan, or urinal?: A Little Help from another person bathing (including washing, rinsing, drying)?: A Little Help from another person to put on and taking off regular upper body clothing?: None Help from another person to put on and taking off regular lower body clothing?: A Little 6 Click Score: 20    End of Session Equipment Utilized During Treatment: Rolling walker CPM Left Knee Additional Comments: placed on cpm at 0-60 degrees on pt left knee.  pt tolerated well.  Bone foam provided and placed at bedside.  (pt alread had knee immobilizer in place at this time.)     OT Visit Diagnosis: Unsteadiness on feet (R26.81);Other abnormalities of gait and mobility (R26.89);Muscle weakness (generalized) (M62.81);Pain Pain - Right/Left: Left Pain - part of body: Knee   Activity Tolerance Patient tolerated treatment well   Patient Left in bed;with call bell/phone within reach;with family/visitor present   Nurse  Communication Mobility status;Weight bearing status;Precautions        Time: 5361-4431 OT Time Calculation (min): 17 min  Charges: OT General Charges $OT Visit: 1 Procedure OT Treatments $Self Care/Home Management : 8-22 mins  [place], OTR/L Acute Rehab Pager: [phone] Office: [place] 02/21/2017, 8:54 AM

## 2017-02-21 NOTE — Progress Notes (Signed)
Physical Therapy Treatment Patient Details Name: Rachael Buck MRN: [id number] DOB: 23-Jul-1941 Today's Date: 02/21/2017    History of Present Illness Pt presents for L TKA with PMH: OA, cirrhosis, DM, GERD.     PT Comments    Pt tolerated increased gait distance this session. Reports she is comfortable negotiating steps at home with the instruction she received in previous session and has no questions about HEP. Pt is progressing towards all mobility goals and expected to d/c to HHPT.    Follow Up Recommendations  Home health PT;Supervision for mobility/OOB     Equipment Recommendations  None recommended by PT    Recommendations for Other Services       Precautions / Restrictions Precautions Precautions: Fall Restrictions Weight Bearing Restrictions: Yes LLE Weight Bearing: Weight bearing as tolerated    Mobility  Bed Mobility Overal bed mobility: Needs Assistance Bed Mobility: Sit to Supine;Supine to Sit     Supine to sit: Supervision Sit to supine: Min assist   General bed mobility comments: Pt use of bedrails to get out of bed.   Transfers Overall transfer level: Needs assistance Equipment used: Rolling walker (2 wheeled) Transfers: Sit to/from Stand Sit to Stand: Supervision         General transfer comment: supervision for safety;  Ambulation/Gait Ambulation/Gait assistance: Supervision Ambulation Distance (Feet): 140 Feet Assistive device: Rolling walker (2 wheeled) Gait Pattern/deviations: Step-through pattern;Antalgic Gait velocity: decreased Gait velocity interpretation: Below normal speed for age/gender General Gait Details: cues for posture and to point L toes forward   Stairs            Wheelchair Mobility    Modified Rankin (Stroke Patients Only)       Balance Overall balance assessment: Needs assistance Sitting-balance support: Feet supported Sitting balance-Leahy Scale: Good Sitting balance - Comments: pt able to  maintain balance sitting EOB but has difficulty wt shifting   Standing balance support: Bilateral upper extremity supported Standing balance-Leahy Scale: Poor Standing balance comment: reliant on UE support                            Cognition Arousal/Alertness: Awake/alert Behavior During Therapy: WFL for tasks assessed/performed Overall Cognitive Status: Within Functional Limits for tasks assessed                                        Exercises Total Joint Exercises Heel Slides: AAROM;Left;10 reps;Seated Long Arc Quad: AAROM;Left;10 reps;Seated Knee Flexion: AROM;Left;5 reps;Seated;Other (comment) (10 sec holds)    General Comments General comments (skin integrity, edema, etc.): husband present for session      Pertinent Vitals/Pain Pain Assessment: 0-10 Pain Score: 5  Faces Pain Scale: Hurts even more Pain Location: left knee with ambulation Pain Descriptors / Indicators: Aching;Grimacing;Guarding Pain Intervention(s): Monitored during session    Home Living Family/patient expects to be discharged to:: Private residence Living Arrangements: Spouse/significant other Available Help at Discharge: Family;Available 24 hours/day Type of Home: House Home Access: Stairs to enter Entrance Stairs-Rails: Right Home Layout: One level Home Equipment: Walker - 2 wheels;Shower seat;Grab bars - tub/shower;Hand held shower head      Prior Function Level of Independence: Independent          PT Goals (current goals can now be found in the care plan section) Acute Rehab PT Goals Patient Stated Goal: return home PT Goal  Formulation: With patient Time For Goal Achievement: 02/26/17 Potential to Achieve Goals: Good Progress towards PT goals: Progressing toward goals    Frequency    7X/week      PT Plan Current plan remains appropriate    Co-evaluation              AM-PAC PT "6 Clicks" Daily Activity  Outcome Measure  Difficulty  turning over in bed (including adjusting bedclothes, sheets and blankets)?: Total Difficulty moving from lying on back to sitting on the side of the bed? : Total Difficulty sitting down on and standing up from a chair with arms (e.g., wheelchair, bedside commode, etc,.)?: A Lot Help needed moving to and from a bed to chair (including a wheelchair)?: A Little Help needed walking in hospital room?: A Little Help needed climbing 3-5 steps with a railing? : A Little 6 Click Score: 13    End of Session Equipment Utilized During Treatment: Gait belt Activity Tolerance: Patient tolerated treatment well Patient left: with call bell/phone within reach;with family/visitor present;in chair   PT Visit Diagnosis: Pain;Difficulty in walking, not elsewhere classified (R26.2);Muscle weakness (generalized) (M62.81) Pain - Right/Left: Left Pain - part of body: Knee     Time: 7494-4967 PT Time Calculation (min) (ACUTE ONLY): 22 min  Charges:  $Gait Training: 8-22 mins                    G Codes:       Benjiman Core, [place] Pager [phone] Acute Rehab    Allena Katz 02/21/2017, 10:14 AM

## 2017-02-21 NOTE — Op Note (Signed)
Rachael Buck, Rachael Buck             ACCOUNT NO.:  [id number]  MEDICAL RECORD NO.:  [id number]  LOCATION:                                 FACILITY:  PHYSICIAN:  Doran Heater. Veverly Fells, M.D. DATE OF BIRTH:  12-14-1940  DATE OF PROCEDURE:  02/18/2017 DATE OF DISCHARGE:                              OPERATIVE REPORT   PREOPERATIVE DIAGNOSIS:  Left knee end-stage osteoarthritis.  POSTOPERATIVE DIAGNOSIS:  Left knee end-stage osteoarthritis.  PROCEDURE PERFORMED:  Left total knee replacement using DePuy Sigma rotating platform prosthesis.  ATTENDING SURGEON:  Doran Heater. Veverly Fells, M.D.  ASSISTANT:  Charletta Cousin [place], [facility], who scrubbed the entire procedure and necessary for satisfactory completion of surgery.  ANESTHESIA:  General anesthesia was used plus adductor canal block.  ESTIMATED BLOOD LOSS:  Less than 100 mL.  FLUID REPLACEMENT:  1500 mL of crystalloid.  INSTRUMENT COUNTS:  Correct.  COMPLICATIONS:  There were no complications.  ANTIBIOTICS:  Perioperative antibiotics were given.  INDICATIONS:  The patient is a 76 year old female with worsening left knee pain secondary to end-stage arthritis.  The patient has already failed an extensive period of conservative management including steroid injections, viscosupplementation, modification of activity, pain medication, anti-inflammatory.  She has also had a prior knee arthroscopy.  The patient has failed all measures of conservative management.  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  Risks and benefits of surgery were discussed. Informed consent was obtained.  DESCRIPTION OF PROCEDURE:  After an adequate level of anesthesia achieved, the patient was positioned in the supine  position.  The left leg correctly identified.  A nonsterile tourniquet placed to the proximal thigh.  Left leg sterilely prepped and draped in usual manner. Time-out called.  We went ahead and elevated the leg, exsanguinated with an Esmarch bandage and went ahead and placed the knee in flexion, made a midline skin incision with a #10 blade scalpel.  Dissection down through subcutaneous tissues.  The medial parapatellar arthrotomy was created with a fresh #10 blade scalpel.  Lateral patellofemoral ligaments divided.  We were not able to evert the patella, so we slid that laterally.  We entered the distal femur with a step-cut drill.  We placed intramedullary resection guide and resected 10 mm off the left distal femur, set on 5 degrees of valgus.  Next, we went ahead and resected ACL, PCL and then removed meniscal tissue; subluxed the tibia anteriorly.  Performed our tibial cut using an external alignment jig, cutting with minimal posterior slope with a 90-degree perpendicular cut, 2 mm off the affected medial side.  Once we had that cut done, we then checked our gaps and our extension gap looked good.  We then placed the knee in flexion, went ahead and completed our femoral preparation with sizing the femur to a size 3 anterior down, placed our 4-in-1 block and cut our anterior, posterior, and chamfer cuts.  We then checked our flexion gap and that was symmetric at 10-12.5 mm with  the extension gap. We then went ahead and completed our tibial preparation with a modular drill and keel punch, placed our 3 tibial tray in place.  We then went ahead and made our box cut for the posterior cruciate substituting prosthesis on the femoral side.  Once we had the box cut done with the oscillating saw, then we placed our 3 left femur in place and then trialed with a size 10 trial.  Placed the knee in extension.  We then resurfaced the patella going from a 25 thickness down to about 16. Cutting with  patellar cutting jig, we drilled for the 35 patellar button and impacted that in position.  We then reduced the patella in the patellofemoral joint, ranged the knee, had nice patellar tracking with no-touch technique.  At this point, we removed all trial components, pulse irrigated the knee, dried thoroughly, and then Vacumix high viscosity DePuy cement on the back table.  We then cemented the components into place, tibia first, then femur, then patella.  Placed the knee in extension with a 10-mm poly insert size 3.  While the cement hardened, we went ahead and irrigated the knee.  We also had a patellar clamp on until cement hardened, removed excess cement after the cement was hardened with the quarter-inch curved osteotome.  We did a final inspection of the posterior aspect of the knee as well as posterior aspect of the tibia for any excess cement.  We then selected a 12.5 poly insert by 3 and placed that real poly insert and reduced the knee with nice little pop medially.  Had excellent stability both in flexion and extension.  Slightly more laxity in flexion and extension, but we were able to achieve full extension. Irrigated thoroughly and then closed the parapatellar arthrotomy with #1 Vicryl suture followed by 2-0 Vicryl subcutaneous closure and 4-0 Monocryl for skin.  Steri-Strips applied followed by sterile dressing.  The patient tolerated the surgery well.     Doran Heater. Veverly Fells, M.D.   ______________________________ Doran Heater. Veverly Fells, M.D.    SRN/MEDQ  D:  02/18/2017  T:  02/18/2017  Job:  [id number]

## 2017-02-21 NOTE — Plan of Care (Signed)
Problem: Pain Managment: Goal: General experience of comfort will improve Patient educated on the pain scale and voices understanding

## 2017-02-21 NOTE — Progress Notes (Signed)
Orthopedics Progress Note  Subjective: Patient feeling better this morning after some sleep last night. No BM yet, pos flatus  Objective:  Vitals:   02/20/17 2101 02/21/17 0402  BP: (!) 132/57 117/60  Pulse: (!) 104 99  Resp: 20 18  Temp: 99.7 F (37.6 C) 98.2 F (36.8 C)    General: Awake and alert  Musculoskeletal: left knee dressing CDI, Neg Homans, excellent quad set and ankle pumps Neurovascularly intact  Lab Results  Component Value Date   WBC PENDING 02/21/2017   HGB 9.6 (L) 02/21/2017   HCT 29.3 (L) 02/21/2017   MCV 83.5 02/21/2017   PLT 83 (L) 02/21/2017       Component Value Date/Time   NA 134 (L) 02/19/2017 0421   NA 127 (L) 02/14/2017 1335   K 3.7 02/19/2017 0421   K 3.6 02/14/2017 1335   CL 95 (L) 02/19/2017 0421   CL 92 (L) 02/14/2017 1335   CO2 30 02/19/2017 0421   CO2 28 02/14/2017 1335   GLUCOSE 204 (H) 02/19/2017 0421   BUN 5 (L) 02/19/2017 0421   BUN 7 (L) 02/14/2017 1335   CREATININE 0.59 02/19/2017 0421   CREATININE 0.47 (L) 02/14/2017 1335   CREATININE 0.56 07/24/2014 1204   CALCIUM 8.5 (L) 02/19/2017 0421   CALCIUM 9.3 02/14/2017 1335   GFRNONAA >60 02/19/2017 0421   GFRAA >60 02/19/2017 0421    Lab Results  Component Value Date   INR 1.2 02/14/2017   INR 1.2 (H) 01/14/2016   INR 1.10 12/04/2013    Assessment/Plan: POD #3 s/p Procedure(s): LEFT TOTAL KNEE ARTHROPLASTY PT, OT, d/c planning = home today, plan for outpatient therapy asap Home health if recommended by therapy DVT prophylaxis with aspirin and TED hose and early mobilization  [employer]. Veverly Fells, MD 02/21/2017 6:47 AM

## 2017-02-25 DIAGNOSIS — Z7982 Long term (current) use of aspirin: Secondary | ICD-10-CM | POA: Diagnosis not present

## 2017-02-25 DIAGNOSIS — K7460 Unspecified cirrhosis of liver: Secondary | ICD-10-CM | POA: Diagnosis not present

## 2017-02-25 DIAGNOSIS — D696 Thrombocytopenia, unspecified: Secondary | ICD-10-CM | POA: Diagnosis not present

## 2017-02-25 DIAGNOSIS — F419 Anxiety disorder, unspecified: Secondary | ICD-10-CM | POA: Diagnosis not present

## 2017-02-25 DIAGNOSIS — I10 Essential (primary) hypertension: Secondary | ICD-10-CM | POA: Diagnosis not present

## 2017-02-25 DIAGNOSIS — Z96652 Presence of left artificial knee joint: Secondary | ICD-10-CM | POA: Diagnosis not present

## 2017-02-25 DIAGNOSIS — E785 Hyperlipidemia, unspecified: Secondary | ICD-10-CM | POA: Diagnosis not present

## 2017-02-25 DIAGNOSIS — Z9181 History of falling: Secondary | ICD-10-CM | POA: Diagnosis not present

## 2017-02-25 DIAGNOSIS — Z471 Aftercare following joint replacement surgery: Secondary | ICD-10-CM | POA: Diagnosis not present

## 2017-02-25 DIAGNOSIS — F329 Major depressive disorder, single episode, unspecified: Secondary | ICD-10-CM | POA: Diagnosis not present

## 2017-02-25 DIAGNOSIS — E119 Type 2 diabetes mellitus without complications: Secondary | ICD-10-CM | POA: Diagnosis not present

## 2017-02-25 DIAGNOSIS — J45909 Unspecified asthma, uncomplicated: Secondary | ICD-10-CM | POA: Diagnosis not present

## 2017-02-28 DIAGNOSIS — F419 Anxiety disorder, unspecified: Secondary | ICD-10-CM | POA: Diagnosis not present

## 2017-02-28 DIAGNOSIS — Z9181 History of falling: Secondary | ICD-10-CM | POA: Diagnosis not present

## 2017-02-28 DIAGNOSIS — K7460 Unspecified cirrhosis of liver: Secondary | ICD-10-CM | POA: Diagnosis not present

## 2017-02-28 DIAGNOSIS — Z471 Aftercare following joint replacement surgery: Secondary | ICD-10-CM | POA: Diagnosis not present

## 2017-02-28 DIAGNOSIS — Z7982 Long term (current) use of aspirin: Secondary | ICD-10-CM | POA: Diagnosis not present

## 2017-02-28 DIAGNOSIS — F329 Major depressive disorder, single episode, unspecified: Secondary | ICD-10-CM | POA: Diagnosis not present

## 2017-02-28 DIAGNOSIS — D696 Thrombocytopenia, unspecified: Secondary | ICD-10-CM | POA: Diagnosis not present

## 2017-02-28 DIAGNOSIS — Z96652 Presence of left artificial knee joint: Secondary | ICD-10-CM | POA: Diagnosis not present

## 2017-02-28 DIAGNOSIS — I10 Essential (primary) hypertension: Secondary | ICD-10-CM | POA: Diagnosis not present

## 2017-02-28 DIAGNOSIS — E119 Type 2 diabetes mellitus without complications: Secondary | ICD-10-CM | POA: Diagnosis not present

## 2017-02-28 DIAGNOSIS — E785 Hyperlipidemia, unspecified: Secondary | ICD-10-CM | POA: Diagnosis not present

## 2017-02-28 DIAGNOSIS — J45909 Unspecified asthma, uncomplicated: Secondary | ICD-10-CM | POA: Diagnosis not present

## 2017-03-03 DIAGNOSIS — Z96652 Presence of left artificial knee joint: Secondary | ICD-10-CM | POA: Diagnosis not present

## 2017-03-03 DIAGNOSIS — Z471 Aftercare following joint replacement surgery: Secondary | ICD-10-CM | POA: Diagnosis not present

## 2017-04-01 ENCOUNTER — Other Ambulatory Visit: Payer: Self-pay | Admitting: Family Medicine

## 2017-04-11 ENCOUNTER — Telehealth: Payer: Self-pay | Admitting: Family Medicine

## 2017-04-11 NOTE — Telephone Encounter (Signed)
ok 

## 2017-04-11 NOTE — Telephone Encounter (Signed)
Patient would like to transfer from Dr. Birdie Riddle to Dr. Salvatore Marvel advise

## 2017-04-11 NOTE — Telephone Encounter (Signed)
Ok to transfer.  Please give her my best 

## 2017-04-11 NOTE — Telephone Encounter (Addendum)
Patient states she will call back to schedule. Thank You

## 2017-04-14 ENCOUNTER — Other Ambulatory Visit: Payer: Self-pay | Admitting: Family Medicine

## 2017-04-14 NOTE — Telephone Encounter (Signed)
Last OV 01/27/17 Alprazolam last filled 01/26/17 #90 with 0

## 2017-04-14 NOTE — Telephone Encounter (Signed)
Medication filled to pharmacy as requested.   

## 2017-04-22 DIAGNOSIS — H2513 Age-related nuclear cataract, bilateral: Secondary | ICD-10-CM | POA: Diagnosis not present

## 2017-04-22 DIAGNOSIS — E119 Type 2 diabetes mellitus without complications: Secondary | ICD-10-CM | POA: Diagnosis not present

## 2017-04-22 LAB — HM DIABETES EYE EXAM

## 2017-04-27 ENCOUNTER — Encounter: Payer: Self-pay | Admitting: General Practice

## 2017-05-04 DIAGNOSIS — Z87891 Personal history of nicotine dependence: Secondary | ICD-10-CM | POA: Diagnosis not present

## 2017-05-04 DIAGNOSIS — H903 Sensorineural hearing loss, bilateral: Secondary | ICD-10-CM | POA: Diagnosis not present

## 2017-05-04 DIAGNOSIS — H6121 Impacted cerumen, right ear: Secondary | ICD-10-CM | POA: Diagnosis not present

## 2017-05-19 ENCOUNTER — Other Ambulatory Visit: Payer: Self-pay | Admitting: Family Medicine

## 2017-05-19 NOTE — Telephone Encounter (Signed)
Last OV 01/27/17 Alprazolam last filled 04/14/17 #90 with 0

## 2017-05-19 NOTE — Telephone Encounter (Signed)
Medication filled to pharmacy as requested.   

## 2017-05-26 ENCOUNTER — Ambulatory Visit: Payer: PPO | Admitting: Family Medicine

## 2017-06-17 ENCOUNTER — Ambulatory Visit (INDEPENDENT_AMBULATORY_CARE_PROVIDER_SITE_OTHER): Payer: PPO | Admitting: Family Medicine

## 2017-06-17 ENCOUNTER — Encounter: Payer: Self-pay | Admitting: Family Medicine

## 2017-06-17 VITALS — BP 141/86 | HR 82 | Temp 98.6°F | Resp 17 | Ht 65.0 in | Wt 202.5 lb

## 2017-06-17 DIAGNOSIS — D696 Thrombocytopenia, unspecified: Secondary | ICD-10-CM | POA: Diagnosis not present

## 2017-06-17 DIAGNOSIS — I10 Essential (primary) hypertension: Secondary | ICD-10-CM

## 2017-06-17 DIAGNOSIS — E1165 Type 2 diabetes mellitus with hyperglycemia: Secondary | ICD-10-CM

## 2017-06-17 DIAGNOSIS — Z23 Encounter for immunization: Secondary | ICD-10-CM | POA: Diagnosis not present

## 2017-06-17 DIAGNOSIS — I8510 Secondary esophageal varices without bleeding: Secondary | ICD-10-CM

## 2017-06-17 DIAGNOSIS — E782 Mixed hyperlipidemia: Secondary | ICD-10-CM

## 2017-06-17 LAB — LIPID PANEL
Cholesterol: 120 mg/dL (ref 0–200)
HDL: 45.8 mg/dL (ref >39.00)
LDL Cholesterol: 41 mg/dL (ref 0–99)
NonHDL: 74.3
Total CHOL/HDL Ratio: 3
Triglycerides: 168 mg/dL — ABNORMAL HIGH (ref 0.0–149.0)
VLDL: 33.6 mg/dL (ref 0.0–40.0)

## 2017-06-17 LAB — HEMOGLOBIN A1C: Hgb A1c MFr Bld: 8.6 % — ABNORMAL HIGH (ref 4.6–6.5)

## 2017-06-17 LAB — CBC WITH DIFFERENTIAL/PLATELET
Basophils Absolute: 0 10*3/uL (ref 0.0–0.1)
Basophils Relative: 0.5 % (ref 0.0–3.0)
Eosinophils Absolute: 0 10*3/uL (ref 0.0–0.7)
Eosinophils Relative: 0.7 % (ref 0.0–5.0)
HCT: 37.7 % (ref 36.0–46.0)
Hemoglobin: 12.8 g/dL (ref 12.0–15.0)
Lymphocytes Relative: 27.8 % (ref 12.0–46.0)
Lymphs Abs: 1.4 10*3/uL (ref 0.7–4.0)
MCHC: 34 g/dL (ref 30.0–36.0)
MCV: 85.1 fl (ref 78.0–100.0)
Monocytes Absolute: 0.3 10*3/uL (ref 0.1–1.0)
Monocytes Relative: 6.3 % (ref 3.0–12.0)
Neutro Abs: 3.3 10*3/uL (ref 1.4–7.7)
Neutrophils Relative %: 64.7 % (ref 43.0–77.0)
Platelets: 78 10*3/uL — ABNORMAL LOW (ref 150.0–400.0)
RBC: 4.43 Mil/uL (ref 3.87–5.11)
RDW: 15.5 % (ref 11.5–15.5)
WBC: 5.1 10*3/uL (ref 4.0–10.5)

## 2017-06-17 LAB — HEPATIC FUNCTION PANEL
ALT: 16 U/L (ref 0–35)
AST: 29 U/L (ref 0–37)
Albumin: 4.2 g/dL (ref 3.5–5.2)
Alkaline Phosphatase: 59 U/L (ref 39–117)
Bilirubin, Direct: 0.2 mg/dL (ref 0.0–0.3)
Total Bilirubin: 1.3 mg/dL — ABNORMAL HIGH (ref 0.2–1.2)
Total Protein: 7.2 g/dL (ref 6.0–8.3)

## 2017-06-17 LAB — BASIC METABOLIC PANEL
BUN: 9 mg/dL (ref 6–23)
CO2: 35 mEq/L — ABNORMAL HIGH (ref 19–32)
Calcium: 9.4 mg/dL (ref 8.4–10.5)
Chloride: 93 mEq/L — ABNORMAL LOW (ref 96–112)
Creatinine, Ser: 0.52 mg/dL (ref 0.40–1.20)
GFR: 122.18 mL/min (ref >60.00)
Glucose, Bld: 192 mg/dL — ABNORMAL HIGH (ref 70–99)
Potassium: 3.7 mEq/L (ref 3.5–5.1)
Sodium: 137 mEq/L (ref 135–145)

## 2017-06-17 LAB — TSH: TSH: 1.99 u[IU]/mL (ref 0.35–4.50)

## 2017-06-17 NOTE — Assessment & Plan Note (Signed)
Following w/ Dr Watt Climes.  Plan is for upcoming EGD.  Will follow along.

## 2017-06-17 NOTE — Assessment & Plan Note (Signed)
Pt has hx of poorly controlled diabetes and intolerance to Metformin.  She did not notify the office that the Januvia was too expensive and I fear she has been without medication for months.  I also worry that her 11 lb weight loss is due to her very high sugars and not due to dietary changes.  UTD on eye exam, foot exam done today.  Check labs.  Adjust meds prn.

## 2017-06-17 NOTE — Assessment & Plan Note (Signed)
Chronic problem.  Pt has been out of her Losartan and did not take her HCTZ which means today's BP is not reflective of her true readings.  Asymptomatic.  Check labs.  No anticipated med changes.  Will follow.

## 2017-06-17 NOTE — Patient Instructions (Signed)
Schedule your complete physical and Medicare Wellness Visit for late January We'll notify you of your lab results and make any changes if needed Restart the Losartan daily for blood pressure Continue the HCTZ for blood pressure Continue the Simvastatin daily Continue the 1/2 tab of Metformin twice daily until we decide what to do about the meds Call with any questions or concerns Happy [birth year][employer] Birthday!!!

## 2017-06-17 NOTE — Assessment & Plan Note (Signed)
Chronic problem.  On Statin daily w/o difficulty.  Stopped Fenofibrate.  Check labs.  Adjust meds prn

## 2017-06-17 NOTE — Progress Notes (Signed)
   Subjective:    Patient ID: Rachael Buck, female    DOB: 1941/06/05, 77 y.o.   MRN: [id number]  HPI DM- chronic problem.  Pt was previously on Metformin but was not able to tolerate due to diarrhea.  She was switched to Januvia but did not start due to cost.  She never notified the office.  Reports she is taking 1/2 tab of Metformin instead.  UTD on eye exam, foot exam.  Pt has lost 11 lbs since last visit.  Home CBG 176 this AM.  Denies numbness/tingling of hands/feet.  No symptomatic lows.  HTN- chronic problem, on HCTZ but did not take meds this AM.  Is supposed to be on Losartan but she is not sure if she is taking- even if she is taking, she is out.  No CP, SOB above baseline.    Hyperlipidemia- chronic problem, on Simvastatin daily.  Not taking fenofibrate.  No abd pain, N/V.   Review of Systems For ROS see HPI     Objective:   Physical Exam  Constitutional: She is oriented to person, place, and time. She appears well-developed and well-nourished. No distress.  HENT:  Head: Normocephalic and atraumatic.  Eyes: Pupils are equal, round, and reactive to light. Conjunctivae and EOM are normal.  Neck: Normal range of motion. Neck supple. No thyromegaly present.  Cardiovascular: Normal rate, regular rhythm, normal heart sounds and intact distal pulses.   No murmur heard. Pulmonary/Chest: Effort normal and breath sounds normal. No respiratory distress.  Abdominal: Soft. She exhibits no distension. There is no tenderness.  + hepatomegaly  Musculoskeletal: She exhibits no edema.  Lymphadenopathy:    She has no cervical adenopathy.  Neurological: She is alert and oriented to person, place, and time.  Skin: Skin is warm and dry.  Psychiatric: She has a normal mood and affect. Her behavior is normal.  Vitals reviewed.         Assessment & Plan:

## 2017-06-17 NOTE — Assessment & Plan Note (Signed)
Pt has hx of low platelets and was told to stop ASA due to platelet inhibition.  Check labs today to assess.

## 2017-06-20 ENCOUNTER — Other Ambulatory Visit: Payer: Self-pay | Admitting: General Practice

## 2017-06-20 ENCOUNTER — Other Ambulatory Visit: Payer: Self-pay | Admitting: Family Medicine

## 2017-06-20 MED ORDER — GLIPIZIDE ER 5 MG PO TB24: 5.0000 mg | ORAL_TABLET | Freq: Every day | ORAL | 0 refills | Status: DC

## 2017-07-08 ENCOUNTER — Other Ambulatory Visit: Payer: Self-pay | Admitting: Family Medicine

## 2017-07-12 ENCOUNTER — Telehealth: Payer: Self-pay | Admitting: Family Medicine

## 2017-07-12 NOTE — Telephone Encounter (Signed)
See note with continued diarrhea on Metformin

## 2017-07-12 NOTE — Telephone Encounter (Signed)
Copied from [place] [id number]. Topic: Quick Communication - See Telephone Encounter >> Jul 12, 2017  8:49 AM Cecelia Byars, NT wrote: CRM for notification. See Telephone encounter for:  07/12/17.

## 2017-07-12 NOTE — Telephone Encounter (Signed)
Copied from [place] [id number]. Topic: Quick Communication - See Telephone Encounter >> Jul 12, 2017  8:49 AM Cecelia Byars, NT wrote: CRM for notification. See Telephone encounter for: Patient is now taking 1000 mg of ,metformin was told to take this instead of 500mg  she has a lot of diarrea  unable to make it to the bathroom,and would like a generic medicine instead due to cost,    07/12/17.

## 2017-07-12 NOTE — Telephone Encounter (Signed)
Pt. returned call.  Reports she has been taking her Metformin 1000 mg, 1/2 tab q AM, and 1/2 tab q PM.  Has also been taking the Glipizide as instructed.  Reported 3 diarrhea stools today; stated she took Colestipol 2 tabs, and Imodium 2mg , x 4 doses today to get the diarrhea under control.  Last stool was at 9:30 AM today.

## 2017-07-12 NOTE — Telephone Encounter (Signed)
Copied from [place] [id number]. Topic: Quick Communication - See Telephone Encounter >> Jul 12, 2017  8:49 AM Cecelia Byars, NT wrote: CRM for notification. See Telephone encounter for: Patient is now taking 1000 mg of ,metformin was told to take this instead of 500mg  she has a lot of diarrea  unable to make it to the bathroom,and would like a generic medicine instead due to cost,    07/12/17.

## 2017-07-12 NOTE — Telephone Encounter (Signed)
This is what pt was told in October about her labs-'A1C remains elevated at 8.6 Based on this, if you are able to tolerate a 1/2 tab of Metformin twice daily, please continue (500mg ). Also, we will add Glucotrol XL 5mg  daily'.  Glucotrol was filled (and is generic) and at no time was she told to increase to 1000mg .

## 2017-07-12 NOTE — Telephone Encounter (Signed)
Metformin is upsetting patients stomach, wanting to know if there is something else she can take

## 2017-07-12 NOTE — Telephone Encounter (Signed)
Called pt today to advise of PCP recommendations. PT husband answered phone and advised patient was not home. Pt will return call when she returns home.   [place] for [place] staff to speak with patient in regards to PCP recommendations, and to discuss her medications with her.

## 2017-07-13 NOTE — Telephone Encounter (Signed)
Please advise? Are there any changes that should be made to patient medications?

## 2017-07-14 NOTE — Telephone Encounter (Signed)
Her A1C keeps climbing (indicating poor sugar control) and she said in her previous visits that she couldn't afford other types of medication (Januvia, Jardiance, etc) and the Glipizide is not strong enough to control her sugars.  So if the Metformin isn't agreeing with her, we need to send her to Endocrinology for evaluation and tx

## 2017-07-14 NOTE — Telephone Encounter (Signed)
Called and spoke with patient. She advised that she is no longer having diarrhea (the immodium stopped her symptoms). However, Pt wants to try taking metformin 500mg  once daily with the glipizide to see if that works for her. Pt is asking to try this therapy until her appointment 09/20/17. If at that time her A1c is elevated she will go to Endo.

## 2017-07-14 NOTE — Telephone Encounter (Signed)
Ok to see how things are going in January

## 2017-07-15 NOTE — Telephone Encounter (Signed)
Noted pt. advised

## 2017-08-01 ENCOUNTER — Other Ambulatory Visit: Payer: Self-pay | Admitting: Family Medicine

## 2017-08-01 NOTE — Telephone Encounter (Signed)
Last OV 06/17/17 Alprazolam last filled 05/19/17 #90 with 1 Dicyclomine last filled 12/14/16 #360 with 0

## 2017-08-01 NOTE — Telephone Encounter (Signed)
Medication filled to pharmacy as requested.   

## 2017-08-04 DIAGNOSIS — L720 Epidermal cyst: Secondary | ICD-10-CM | POA: Diagnosis not present

## 2017-09-12 ENCOUNTER — Other Ambulatory Visit: Payer: Self-pay | Admitting: Family Medicine

## 2017-09-12 NOTE — Telephone Encounter (Signed)
Last OV 06/17/17 Alprazolam last filled 08/01/17 #90 with 0

## 2017-09-13 DIAGNOSIS — Z471 Aftercare following joint replacement surgery: Secondary | ICD-10-CM | POA: Diagnosis not present

## 2017-09-13 DIAGNOSIS — Z96652 Presence of left artificial knee joint: Secondary | ICD-10-CM | POA: Diagnosis not present

## 2017-09-15 ENCOUNTER — Other Ambulatory Visit: Payer: Self-pay

## 2017-09-15 DIAGNOSIS — I8392 Asymptomatic varicose veins of left lower extremity: Secondary | ICD-10-CM

## 2017-09-19 NOTE — Progress Notes (Addendum)
Subjective:   Rachael Buck is a 77 y.o. female who presents for Medicare Annual (Subsequent) preventive examination.  Review of Systems:  No ROS.  Medicare Wellness Visit. Additional risk factors are reflected in the social history.  Cardiac Risk Factors include: advanced age (>12mn, >>76women);diabetes mellitus;dyslipidemia;obesity (BMI >30kg/m2);hypertension   Sleep patterns: Sleeps 8-9 hours.  Home Safety/Smoke Alarms: Feels safe in home. Smoke alarms in place.  Living environment; residence and Firearm Safety: Lives with husband in 1 story home.  Seat Belt Safety/Bike Helmet: Wears seat belt.   Female:   Pap-N/A       Mammo-12/07/2016, negative.         Dexa scan-12/07/2016, normal.          CCS-Colonoscopy 03/31/2015, polyp. Recall 5 years.      Objective:     Vitals: BP (!) 142/78 (BP Location: Left Arm, Patient Position: Sitting, Cuff Size: Normal)   Pulse 80   Temp (!) 97.5 F (36.4 C) (Temporal)   Resp 18   Ht 5' 5"  (1.651 m)   Wt 207 lb 9.6 oz (94.2 kg)   SpO2 98%   BMI 34.55 kg/m   Body mass index is 34.55 kg/m.  Advanced Directives 09/20/2017 02/18/2017 02/14/2017 02/08/2017 11/22/2015 03/01/2015  Does Patient Have a Medical Advance Directive? Yes Yes Yes Yes No No  Type of [place] Healthcare Power of [place]- -  Does patient want to make changes to medical advance directive? - No - Patient declined - No - Patient declined - -  Copy of [place]? No - copy requested No - copy requested - No - copy requested - -    Tobacco Social History   Tobacco Use  Smoking Status Former Smoker  Smokeless Tobacco Never Used     Counseling given: Not Answered   Past Medical History:  Diagnosis Date  . Anxiety   . Arthritis   . Asthma   . Cirrhosis, nonalcoholic ([place]   . Colon polyps   . Diabetes mellitus   .  Diverticulitis   . GERD (gastroesophageal reflux disease)   . Hyperlipidemia   . Hypertension   . NASH (nonalcoholic steatohepatitis)   . Obesity   . Pneumonia    Past Surgical History:  Procedure Laterality Date  . CHOLECYSTECTOMY  1981  . OOPHORECTOMY  1973  . PARTIAL HYSTERECTOMY  1972  . ROTATOR CUFF REPAIR     bilateral. 2006, 2008  . 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 Breast cancer Daughter   . Breast cancer Maternal Aunt   . Diabetes Brother   . Diabetes Sister   . Stomach cancer Mother   . Cancer Other   . GER disease Other   . Obesity Other    Social History   Socioeconomic History  . Marital status: Married    Spouse name: None  . Number of children: None  . Years of education: None  . Highest education level: None  Social Needs  . Financial resource strain: None  . Food insecurity - worry: None  . Food insecurity - inability: None  . Transportation needs -  medical: None  . Transportation needs - non-medical: None  Occupational History  . None  Tobacco Use  . Smoking status: Former Research scientist (life sciences)  . Smokeless tobacco: Never Used  Substance and Sexual Activity  . Alcohol use: No  . Drug use: No  . Sexual activity: None  Other Topics Concern  . None  Social History Narrative  . None    Outpatient Encounter Medications as of 09/20/2017  Medication Sig  . albuterol (PROAIR HFA) 108 (90 Base) MCG/ACT inhaler Inhale 2 puffs into the lungs every 4 (four) hours as needed for wheezing or shortness of breath.  . ALPRAZolam (XANAX) 0.5 MG tablet TAKE 1 TABLET BY MOUTH THREE TIMES DAILY  . colestipol (COLESTID) 1 g tablet Take 1 g by mouth daily.  Marland Kitchen dicyclomine (BENTYL) 20 MG tablet TAKE 1 TABLET BY MOUTH THREE TIMES  DAILY AS NEEDED  . diphenhydrAMINE (BENADRYL) 25 MG tablet Take 25 mg by mouth every 6 (six) hours as needed (for allergies.).  Marland Kitchen glipiZIDE (GLUCOTROL XL) 5 MG 24 hr tablet TAKE 1 TABLET(5 MG) BY MOUTH DAILY WITH BREAKFAST  . hydrochlorothiazide (HYDRODIURIL) 25 MG tablet TAKE 1 TABLET BY MOUTH EVERY DAY  . hydrocortisone 2.5 % cream Apply topically.  Marland Kitchen loperamide (IMODIUM) 2 MG capsule Take 4-6 mg by mouth daily as needed (for IBS symptoms).  . losartan (COZAAR) 50 MG tablet TAKE 1 TABLET BY MOUTH EVERY DAY  . metFORMIN (GLUCOPHAGE) 1000 MG tablet TAKE 1 TABLET BY MOUTH TWICE DAILY WITH MEALS  . omeprazole (PRILOSEC) 40 MG capsule TAKE 1 CAPSULE BY MOUTH EVERY DAY  . ONE TOUCH ULTRA TEST test strip USE AS DIRECTED (Patient taking differently: TEST BLOOD SUGARS ONCE DAILY IN THE MORNING)  . ONETOUCH DELICA LANCETS FINE MISC 1 Stick by Does not apply route 2 (two) times daily. (Patient taking differently: 1 each by Other route daily. )  . sertraline (ZOLOFT) 50 MG tablet TAKE 1 TABLET BY MOUTH EVERY DAY  . simvastatin (ZOCOR) 80 MG tablet TAKE 1 TABLET BY MOUTH EVERY DAY  . sitaGLIPtin (JANUVIA) 100 MG tablet Take 1 tablet (100 mg total) by mouth daily.  Marland Kitchen aspirin (ASPIRIN CHILDRENS) 81 MG chewable tablet Chew 1 tablet (81 mg total) by mouth 2 (two) times daily. (Patient not taking: Reported on 06/17/2017)  . fenofibrate 160 MG tablet Take 1 tablet (160 mg total) by mouth daily. (Patient not taking: Reported on 02/03/2017)   No facility-administered encounter medications on file as of 09/20/2017.     Activities of Daily Living In your present state of health, do you have any difficulty performing the following activities: 09/20/2017 06/17/2017  Hearing? N N  Vision? N N  Difficulty concentrating or making decisions? N N  Walking or climbing stairs? N N  Dressing or bathing? N N  Doing errands, shopping? N N  Preparing Food and eating ? N -  Using the Toilet? N -  In the past six months, have  you accidently leaked urine? N -  Do you have problems with loss of bowel control? N -  Managing your Medications? N -  Managing your Finances? N -  Housekeeping or managing your Housekeeping? N -  Some recent data might be hidden    Patient Care Team: Midge Minium, MD as PCP - General (Family Medicine) Clarene Essex, MD as Consulting Physician (Gastroenterology) Melissa Noon, [place] as Referring Physician (Optometry) Netta Cedars, MD as Consulting Physician (Orthopedic Surgery) Jarome Matin, MD as Consulting Physician (Dermatology)  Assessment:   This is a routine wellness examination for [place].  Exercise Activities and Dietary recommendations Current Exercise Habits: The patient does not participate in regular exercise at present(Manages household), Exercise limited by: None identified   Diet (meal preparation, eat out, water intake, caffeinated beverages, dairy products, fruits and vegetables): Drinks tea.   Breakfast: cereal Lunch: sandwich, salad, potato Dinner: soup; beans  Goals    . Increase physical activity     Increase activity by joining gym and walking.        Fall Risk Fall Risk  09/20/2017 06/17/2017 08/24/2016 09/16/2015 02/11/2015  Falls in the past year? No No No No No    Depression Screen PHQ 2/9 Scores 09/20/2017 06/17/2017 08/24/2016 09/16/2015  PHQ - 2 Score 2 0 0 0  PHQ- 9 Score 8 0 0 -  Exception Documentation - - - Patient refusal     Cognitive Function MMSE - Mini Mental State Exam 09/20/2017  Orientation to time 5  Orientation to Place 5  Registration 3  Attention/ Calculation 5  Recall 1  Language- name 2 objects 2  Language- repeat 1  Language- follow 3 step command 3  Language- read & follow direction 1  Write a sentence 1  Copy design 1  Total score 28        Immunization History  Administered Date(s) Administered  . Hepatitis B 08/10/2012, 09/11/2012, 11/23/2012  . Influenza Whole 05/30/2010  . Influenza, High Dose  Seasonal PF 05/13/2015  . Influenza,inj,Quad PF,6+ Mos 08/24/2016, 06/17/2017  . Pneumococcal Conjugate-13 02/11/2015  . Pneumococcal Polysaccharide-23 05/30/2008  . Td 08/31/2007    Screening Tests Health Maintenance  Topic Date Due  . FOOT EXAM  08/24/2017  . TETANUS/TDAP  09/20/2018 (Originally 08/30/2017)  . MAMMOGRAM  12/07/2017  . HEMOGLOBIN A1C  12/16/2017  . OPHTHALMOLOGY EXAM  04/22/2018  . COLONOSCOPY  03/30/2020  . INFLUENZA VACCINE  Completed  . DEXA SCAN  Completed  . PNA vac Low Risk Adult  Completed   Diabetic Foot Exam - Simple   Simple Foot Form Diabetic Foot exam was performed with the following findings:  Yes 09/20/2017  9:51 AM  Visual Inspection No deformities, no ulcerations, no other skin breakdown bilaterally:  Yes Sensation Testing Intact to touch and monofilament testing bilaterally:  Yes Pulse Check Posterior Tibialis and Dorsalis pulse intact bilaterally:  Yes Comments          Plan:    Bring a copy of your living will and/or healthcare power of attorney to your next office visit.  Continue doing brain stimulating activities (puzzles, reading, adult coloring books, staying active) to keep memory sharp.   Eat heart healthy diet (full of fruits, vegetables, whole grains, lean protein, water--limit salt, fat, and sugar intake) and increase physical activity as tolerated.  I have personally reviewed and noted the following in the patient's chart:   . Medical and social history . Use of alcohol, tobacco or illicit drugs  . Current medications and supplements . Functional ability and status . Nutritional status . Physical activity . Advanced directives . List of other physicians . Hospitalizations, surgeries, and ER visits in previous 12 months . Vitals . Screenings to include cognitive, depression, and falls . Referrals and appointments  In addition, I have reviewed and discussed with patient certain preventive protocols, quality metrics,  and best practice recommendations. A written personalized care plan for preventive services as well as general preventive health recommendations were provided to patient.     Joelene Millin  Margarito Liner, RN  09/20/2017  Reviewed documentation provided by RN and agree w/ above.  Annye Asa, MD

## 2017-09-20 ENCOUNTER — Other Ambulatory Visit: Payer: Self-pay

## 2017-09-20 ENCOUNTER — Ambulatory Visit (INDEPENDENT_AMBULATORY_CARE_PROVIDER_SITE_OTHER): Payer: PPO | Admitting: Family Medicine

## 2017-09-20 ENCOUNTER — Ambulatory Visit (INDEPENDENT_AMBULATORY_CARE_PROVIDER_SITE_OTHER): Payer: PPO

## 2017-09-20 ENCOUNTER — Encounter: Payer: Self-pay | Admitting: Family Medicine

## 2017-09-20 VITALS — BP 136/82 | HR 80 | Temp 97.5°F | Resp 18 | Ht 65.0 in | Wt 207.4 lb

## 2017-09-20 VITALS — BP 142/78 | HR 80 | Temp 97.5°F | Resp 18 | Ht 65.0 in | Wt 207.6 lb

## 2017-09-20 DIAGNOSIS — E1165 Type 2 diabetes mellitus with hyperglycemia: Secondary | ICD-10-CM

## 2017-09-20 DIAGNOSIS — R8290 Unspecified abnormal findings in urine: Secondary | ICD-10-CM

## 2017-09-20 DIAGNOSIS — I8510 Secondary esophageal varices without bleeding: Secondary | ICD-10-CM | POA: Diagnosis not present

## 2017-09-20 DIAGNOSIS — F329 Major depressive disorder, single episode, unspecified: Secondary | ICD-10-CM

## 2017-09-20 DIAGNOSIS — R300 Dysuria: Secondary | ICD-10-CM | POA: Diagnosis not present

## 2017-09-20 DIAGNOSIS — J329 Chronic sinusitis, unspecified: Secondary | ICD-10-CM | POA: Diagnosis not present

## 2017-09-20 DIAGNOSIS — R319 Hematuria, unspecified: Secondary | ICD-10-CM

## 2017-09-20 DIAGNOSIS — Z0000 Encounter for general adult medical examination without abnormal findings: Secondary | ICD-10-CM

## 2017-09-20 DIAGNOSIS — B9689 Other specified bacterial agents as the cause of diseases classified elsewhere: Secondary | ICD-10-CM | POA: Diagnosis not present

## 2017-09-20 DIAGNOSIS — R82998 Other abnormal findings in urine: Secondary | ICD-10-CM

## 2017-09-20 DIAGNOSIS — F32A Depression, unspecified: Secondary | ICD-10-CM

## 2017-09-20 LAB — CBC WITH DIFFERENTIAL/PLATELET
Basophils Absolute: 0 10*3/uL (ref 0.0–0.1)
Basophils Relative: 0.4 % (ref 0.0–3.0)
Eosinophils Absolute: 0.1 10*3/uL (ref 0.0–0.7)
Eosinophils Relative: 0.7 % (ref 0.0–5.0)
HCT: 33.8 % — ABNORMAL LOW (ref 36.0–46.0)
Hemoglobin: 11.7 g/dL — ABNORMAL LOW (ref 12.0–15.0)
Lymphocytes Relative: 19.2 % (ref 12.0–46.0)
Lymphs Abs: 1.4 10*3/uL (ref 0.7–4.0)
MCHC: 34.5 g/dL (ref 30.0–36.0)
MCV: 85.6 fl (ref 78.0–100.0)
Monocytes Absolute: 0.5 10*3/uL (ref 0.1–1.0)
Monocytes Relative: 6.9 % (ref 3.0–12.0)
Neutro Abs: 5.4 10*3/uL (ref 1.4–7.7)
Neutrophils Relative %: 72.8 % (ref 43.0–77.0)
Platelets: 87 10*3/uL — ABNORMAL LOW (ref 150.0–400.0)
RBC: 3.95 Mil/uL (ref 3.87–5.11)
RDW: 15.4 % (ref 11.5–15.5)
WBC: 7.5 10*3/uL (ref 4.0–10.5)

## 2017-09-20 LAB — BASIC METABOLIC PANEL
BUN: 9 mg/dL (ref 6–23)
CO2: 35 mEq/L — ABNORMAL HIGH (ref 19–32)
Calcium: 9.2 mg/dL (ref 8.4–10.5)
Chloride: 95 mEq/L — ABNORMAL LOW (ref 96–112)
Creatinine, Ser: 0.47 mg/dL (ref 0.40–1.20)
GFR: 137.2 mL/min (ref >60.00)
Glucose, Bld: 168 mg/dL — ABNORMAL HIGH (ref 70–99)
Potassium: 3.6 mEq/L (ref 3.5–5.1)
Sodium: 138 mEq/L (ref 135–145)

## 2017-09-20 LAB — LIPID PANEL
Cholesterol: 112 mg/dL (ref 0–200)
HDL: 41.4 mg/dL (ref >39.00)
LDL Cholesterol: 47 mg/dL (ref 0–99)
NonHDL: 70.49
Total CHOL/HDL Ratio: 3
Triglycerides: 119 mg/dL (ref 0.0–149.0)
VLDL: 23.8 mg/dL (ref 0.0–40.0)

## 2017-09-20 LAB — POCT URINALYSIS DIPSTICK
Bilirubin, UA: NEGATIVE
Glucose, UA: NEGATIVE
Ketones, UA: NEGATIVE
Nitrite, UA: NEGATIVE
Protein, UA: NEGATIVE
Spec Grav, UA: 1.015 (ref 1.010–1.025)
Urobilinogen, UA: 0.2 E.U./dL
pH, UA: 5 (ref 5.0–8.0)

## 2017-09-20 LAB — HEPATIC FUNCTION PANEL
ALT: 13 U/L (ref 0–35)
AST: 16 U/L (ref 0–37)
Albumin: 4 g/dL (ref 3.5–5.2)
Alkaline Phosphatase: 61 U/L (ref 39–117)
Bilirubin, Direct: 0.2 mg/dL (ref 0.0–0.3)
Total Bilirubin: 1.2 mg/dL (ref 0.2–1.2)
Total Protein: 7.1 g/dL (ref 6.0–8.3)

## 2017-09-20 LAB — TSH: TSH: 2.71 u[IU]/mL (ref 0.35–4.50)

## 2017-09-20 LAB — HEMOGLOBIN A1C: Hgb A1c MFr Bld: 7.4 % — ABNORMAL HIGH (ref 4.6–6.5)

## 2017-09-20 MED ORDER — SULFAMETHOXAZOLE-TRIMETHOPRIM 800-160 MG PO TABS: 1.0000 | ORAL_TABLET | Freq: Two times a day (BID) | ORAL | 0 refills | Status: DC

## 2017-09-20 NOTE — Assessment & Plan Note (Signed)
Pt's PE unchanged from previous w/ exception of acute sinusitis.  UTD on mammo, colonoscopy, immunizations.  Reviewed Medicare Wellness Visit.   Check labs.  Anticipatory guidance provided.

## 2017-09-20 NOTE — Patient Instructions (Addendum)
Bring a copy of your living will and/or healthcare power of attorney to your next office visit.  Continue doing brain stimulating activities (puzzles, reading, adult coloring books, staying active) to keep memory sharp.   Eat heart healthy diet (full of fruits, vegetables, whole grains, lean protein, water--limit salt, fat, and sugar intake) and increase physical activity as tolerated.  Fall Prevention in the Home Falls can cause injuries. They can happen to people of all ages. There are many things you can do to make your home safe and to help prevent falls. What can I do on the outside of my home?  Regularly fix the edges of walkways and driveways and fix any cracks.  Remove anything that might make you trip as you walk through a door, such as a raised step or threshold.  Trim any bushes or trees on the path to your home.  Use bright outdoor lighting.  Clear any walking paths of anything that might make someone trip, such as rocks or tools.  Regularly check to see if handrails are loose or broken. Make sure that both sides of any steps have handrails.  Any raised decks and porches should have guardrails on the edges.  Have any leaves, snow, or ice cleared regularly.  Use sand or salt on walking paths during winter.  Clean up any spills in your garage right away. This includes oil or grease spills. What can I do in the bathroom?  Use night lights.  Install grab bars by the toilet and in the tub and shower. Do not use towel bars as grab bars.  Use non-skid mats or decals in the tub or shower.  If you need to sit down in the shower, use a plastic, non-slip stool.  Keep the floor dry. Clean up any water that spills on the floor as soon as it happens.  Remove soap buildup in the tub or shower regularly.  Attach bath mats securely with double-sided non-slip rug tape.  Do not have throw rugs and other things on the floor that can make you trip. What can I do in the  bedroom?  Use night lights.  Make sure that you have a light by your bed that is easy to reach.  Do not use any sheets or blankets that are too big for your bed. They should not hang down onto the floor.  Have a firm chair that has side arms. You can use this for support while you get dressed.  Do not have throw rugs and other things on the floor that can make you trip. What can I do in the kitchen?  Clean up any spills right away.  Avoid walking on wet floors.  Keep items that you use a lot in easy-to-reach places.  If you need to reach something above you, use a strong step stool that has a grab bar.  Keep electrical cords out of the way.  Do not use floor polish or wax that makes floors slippery. If you must use wax, use non-skid floor wax.  Do not have throw rugs and other things on the floor that can make you trip. What can I do with my stairs?  Do not leave any items on the stairs.  Make sure that there are handrails on both sides of the stairs and use them. Fix handrails that are broken or loose. Make sure that handrails are as long as the stairways.  Check any carpeting to make sure that it is firmly attached to  the stairs. Fix any carpet that is loose or worn.  Avoid having throw rugs at the top or bottom of the stairs. If you do have throw rugs, attach them to the floor with carpet tape.  Make sure that you have a light switch at the top of the stairs and the bottom of the stairs. If you do not have them, ask someone to add them for you. What else can I do to help prevent falls?  Wear shoes that: ? Do not have high heels. ? Have rubber bottoms. ? Are comfortable and fit you well. ? Are closed at the toe. Do not wear sandals.  If you use a stepladder: ? Make sure that it is fully opened. Do not climb a closed stepladder. ? Make sure that both sides of the stepladder are locked into place. ? Ask someone to hold it for you, if possible.  Clearly mark and make  sure that you can see: ? Any grab bars or handrails. ? First and last steps. ? Where the edge of each step is.  Use tools that help you move around (mobility aids) if they are needed. These include: ? Canes. ? Walkers. ? Scooters. ? Crutches.  Turn on the lights when you go into a dark area. Replace any light bulbs as soon as they burn out.  Set up your furniture so you have a clear path. Avoid moving your furniture around.  If any of your floors are uneven, fix them.  If there are any pets around you, be aware of where they are.  Review your medicines with your doctor. Some medicines can make you feel dizzy. This can increase your chance of falling. Ask your doctor what other things that you can do to help prevent falls. This information is not intended to replace advice given to you by your health care provider. Make sure you discuss any questions you have with your health care provider. Document Released: 06/12/2009 Document Revised: 01/22/2016 Document Reviewed: 09/20/2014 Elsevier Interactive Patient Education  2018 [place] Maintenance, Female Adopting a healthy lifestyle and getting preventive care can go a long way to promote health and wellness. Talk with your health care provider about what schedule of regular examinations is right for you. This is a good chance for you to check in with your provider about disease prevention and staying healthy. In between checkups, there are plenty of things you can do on your own. Experts have done a lot of research about which lifestyle changes and preventive measures are most likely to keep you healthy. Ask your health care provider for more information. Weight and diet Eat a healthy diet  Be sure to include plenty of vegetables, fruits, low-fat dairy products, and lean protein.  Do not eat a lot of foods high in solid fats, added sugars, or salt.  Get regular exercise. This is one of the most important things you can do  for your health. ? Most adults should exercise for at least 150 minutes each week. The exercise should increase your heart rate and make you sweat (moderate-intensity exercise). ? Most adults should also do strengthening exercises at least twice a week. This is in addition to the moderate-intensity exercise.  Maintain a healthy weight  Body mass index (BMI) is a measurement that can be used to identify possible weight problems. It estimates body fat based on height and weight. Your health care provider can help determine your BMI and help you achieve or maintain  a healthy weight.  For females 17 years of age and older: ? A BMI below 18.5 is considered underweight. ? A BMI of 18.5 to 24.9 is normal. ? A BMI of 25 to 29.9 is considered overweight. ? A BMI of 30 and above is considered obese.  Watch levels of cholesterol and blood lipids  You should start having your blood tested for lipids and cholesterol at 76 years of age, then have this test every 5 years.  You may need to have your cholesterol levels checked more often if: ? Your lipid or cholesterol levels are high. ? You are older than 76 years of age. ? You are at high risk for heart disease.  Cancer screening Lung Cancer  Lung cancer screening is recommended for adults 7-20 years old who are at high risk for lung cancer because of a history of smoking.  A yearly low-dose CT scan of the lungs is recommended for people who: ? Currently smoke. ? Have quit within the past 15 years. ? Have at least a 30-pack-year history of smoking. A pack year is smoking an average of one pack of cigarettes a day for 1 year.  Yearly screening should continue until it has been 15 years since you quit.  Yearly screening should stop if you develop a health problem that would prevent you from having lung cancer treatment.  Breast Cancer  Practice breast self-awareness. This means understanding how your breasts normally appear and feel.  It  also means doing regular breast self-exams. Let your health care provider know about any changes, no matter how small.  If you are in your 20s or 30s, you should have a clinical breast exam (CBE) by a health care provider every 1-3 years as part of a regular health exam.  If you are 32 or older, have a CBE every year. Also consider having a breast X-ray (mammogram) every year.  If you have a family history of breast cancer, talk to your health care provider about genetic screening.  If you are at high risk for breast cancer, talk to your health care provider about having an MRI and a mammogram every year.  Breast cancer gene (BRCA) assessment is recommended for women who have family members with BRCA-related cancers. BRCA-related cancers include: ? Breast. ? Ovarian. ? Tubal. ? Peritoneal cancers.  Results of the assessment will determine the need for genetic counseling and BRCA1 and BRCA2 testing.  Cervical Cancer Your health care provider may recommend that you be screened regularly for cancer of the pelvic organs (ovaries, uterus, and vagina). This screening involves a pelvic examination, including checking for microscopic changes to the surface of your cervix (Pap test). You may be encouraged to have this screening done every 3 years, beginning at age 23.  For women ages 79-65, health care providers may recommend pelvic exams and Pap testing every 3 years, or they may recommend the Pap and pelvic exam, combined with testing for human papilloma virus (HPV), every 5 years. Some types of HPV increase your risk of cervical cancer. Testing for HPV may also be done on women of any age with unclear Pap test results.  Other health care providers may not recommend any screening for nonpregnant women who are considered low risk for pelvic cancer and who do not have symptoms. Ask your health care provider if a screening pelvic exam is right for you.  If you have had past treatment for cervical  cancer or a condition that could lead to  cancer, you need Pap tests and screening for cancer for at least 20 years after your treatment. If Pap tests have been discontinued, your risk factors (such as having a new sexual partner) need to be reassessed to determine if screening should resume. Some women have medical problems that increase the chance of getting cervical cancer. In these cases, your health care provider may recommend more frequent screening and Pap tests.  Colorectal Cancer  This type of cancer can be detected and often prevented.  Routine colorectal cancer screening usually begins at 76 years of age and continues through 76 years of age.  Your health care provider may recommend screening at an earlier age if you have risk factors for colon cancer.  Your health care provider may also recommend using home test kits to check for hidden blood in the stool.  A small camera at the end of a tube can be used to examine your colon directly (sigmoidoscopy or colonoscopy). This is done to check for the earliest forms of colorectal cancer.  Routine screening usually begins at age 51.  Direct examination of the colon should be repeated every 5-10 years through 76 years of age. However, you may need to be screened more often if early forms of precancerous polyps or small growths are found.  Skin Cancer  Check your skin from head to toe regularly.  Tell your health care provider about any new moles or changes in moles, especially if there is a change in a mole's shape or color.  Also tell your health care provider if you have a mole that is larger than the size of a pencil eraser.  Always use sunscreen. Apply sunscreen liberally and repeatedly throughout the day.  Protect yourself by wearing long sleeves, pants, a wide-brimmed hat, and sunglasses whenever you are outside.  Heart disease, diabetes, and high blood pressure  High blood pressure causes heart disease and increases the risk  of stroke. High blood pressure is more likely to develop in: ? People who have blood pressure in the high end of the normal range (130-139/85-89 mm Hg). ? People who are overweight or obese. ? People who are African American.  If you are 8-48 years of age, have your blood pressure checked every 3-5 years. If you are 55 years of age or older, have your blood pressure checked every year. You should have your blood pressure measured twice-once when you are at a hospital or clinic, and once when you are not at a hospital or clinic. Record the average of the two measurements. To check your blood pressure when you are not at a hospital or clinic, you can use: ? An automated blood pressure machine at a pharmacy. ? A home blood pressure monitor.  If you are between 30 years and 85 years old, ask your health care provider if you should take aspirin to prevent strokes.  Have regular diabetes screenings. This involves taking a blood sample to check your fasting blood sugar level. ? If you are at a normal weight and have a low risk for diabetes, have this test once every three years after 76 years of age. ? If you are overweight and have a high risk for diabetes, consider being tested at a younger age or more often. Preventing infection Hepatitis B  If you have a higher risk for hepatitis B, you should be screened for this virus. You are considered at high risk for hepatitis B if: ? You were born in a country where  hepatitis B is common. Ask your health care provider which countries are considered high risk. ? Your parents were born in a high-risk country, and you have not been immunized against hepatitis B (hepatitis B vaccine). ? You have HIV or AIDS. ? You use needles to inject street drugs. ? You live with someone who has hepatitis B. ? You have had sex with someone who has hepatitis B. ? You get hemodialysis treatment. ? You take certain medicines for conditions, including cancer, organ  transplantation, and autoimmune conditions.  Hepatitis C  Blood testing is recommended for: ? Everyone born from [birth year] through 1964. ? Anyone with known risk factors for hepatitis C.  Sexually transmitted infections (STIs)  You should be screened for sexually transmitted infections (STIs) including gonorrhea and chlamydia if: ? You are sexually active and are younger than 76 years of age. ? You are older than 76 years of age and your health care provider tells you that you are at risk for this type of infection. ? Your sexual activity has changed since you were last screened and you are at an increased risk for chlamydia or gonorrhea. Ask your health care provider if you are at risk.  If you do not have HIV, but are at risk, it may be recommended that you take a prescription medicine daily to prevent HIV infection. This is called pre-exposure prophylaxis (PrEP). You are considered at risk if: ? You are sexually active and do not regularly use condoms or know the HIV status of your partner(s). ? You take drugs by injection. ? You are sexually active with a partner who has HIV.  Talk with your health care provider about whether you are at high risk of being infected with HIV. If you choose to begin PrEP, you should first be tested for HIV. You should then be tested every 3 months for as long as you are taking PrEP. Pregnancy  If you are premenopausal and you may become pregnant, ask your health care provider about preconception counseling.  If you may become pregnant, take 400 to 800 micrograms (mcg) of folic acid every day.  If you want to prevent pregnancy, talk to your health care provider about birth control (contraception). Osteoporosis and menopause  Osteoporosis is a disease in which the bones lose minerals and strength with aging. This can result in serious bone fractures. Your risk for osteoporosis can be identified using a bone density scan.  If you are 72 years of age or  older, or if you are at risk for osteoporosis and fractures, ask your health care provider if you should be screened.  Ask your health care provider whether you should take a calcium or vitamin D supplement to lower your risk for osteoporosis.  Menopause may have certain physical symptoms and risks.  Hormone replacement therapy may reduce some of these symptoms and risks. Talk to your health care provider about whether hormone replacement therapy is right for you. Follow these instructions at home:  Schedule regular health, dental, and eye exams.  Stay current with your immunizations.  Do not use any tobacco products including cigarettes, chewing tobacco, or electronic cigarettes.  If you are pregnant, do not drink alcohol.  If you are breastfeeding, limit how much and how often you drink alcohol.  Limit alcohol intake to no more than 1 drink per day for nonpregnant women. One drink equals 12 ounces of beer, 5 ounces of wine, or 1 ounces of hard liquor.  Do not use street  drugs.  Do not share needles.  Ask your health care provider for help if you need support or information about quitting drugs.  Tell your health care provider if you often feel depressed.  Tell your health care provider if you have ever been abused or do not feel safe at home. This information is not intended to replace advice given to you by your health care provider. Make sure you discuss any questions you have with your health care provider. Document Released: 03/01/2011 Document Revised: 01/22/2016 Document Reviewed: 05/20/2015 Elsevier Interactive Patient Education  [employer].

## 2017-09-20 NOTE — Progress Notes (Signed)
   Subjective:    Patient ID: Rachael Buck, female    DOB: [birth year], 77 y.o.   MRN: [id number]  HPI CPE- UTD on colonoscopy, mammo, eye exam.  Foot exam done at [facility] Visit today.  UTD on DEXA.  DM- chronic problem.  Pt has gained 4 lbs since last visit.  On Januvia 100mg  daily, Metformin 1000mg  BID, Glipizide XL 5mg  daily.  Last A1C was 8.6  UTD on eye exam, foot exam, and on ARB for renal protection.  Dysuria- pt reports she had burning ~3 weeks ago and then developed a 'terrible odor' 7-10 days ago.  Denies increased frequency.  Pt reports low grade temps (99.9) the last 2 days.  Sinus pressure- sxs started 10 days ago w/ sore throat.  + sick contacts.  + HA, sinus pressure.  +facial pain.  Nasal congestion.   Review of Systems Patient reports no vision/ hearing changes, adenopathy,fever, weight change,  persistant/recurrent hoarseness , swallowing issues, chest pain, palpitations, edema, persistant/recurrent cough, hemoptysis, dyspnea (rest/exertional/paroxysmal nocturnal), gastrointestinal bleeding (melena, rectal bleeding), abdominal pain, significant heartburn, bowel changes, GU symptoms (dysuria, hematuria, incontinence), Gyn symptoms (abnormal  bleeding, pain),  syncope, focal weakness, memory loss, numbness & tingling, skin/hair/nail changes, abnormal bruising or bleeding.     Objective:   Physical Exam  Constitutional: She is oriented to person, place, and time. She appears well-developed and well-nourished. No distress.  HENT:  Head: Normocephalic and atraumatic.  Right Ear: Tympanic membrane normal.  Left Ear: Tympanic membrane normal.  Nose: Mucosal edema and rhinorrhea present. Right sinus exhibits maxillary sinus tenderness. Right sinus exhibits no frontal sinus tenderness. Left sinus exhibits maxillary sinus tenderness. Left sinus exhibits no frontal sinus tenderness.  Mouth/Throat: Uvula is midline and mucous membranes are normal. Posterior oropharyngeal erythema  present. No oropharyngeal exudate.  Eyes: Conjunctivae and EOM are normal. Pupils are equal, round, and reactive to light.  Neck: Normal range of motion. Neck supple.  Cardiovascular: Normal rate, regular rhythm and normal heart sounds.  Pulmonary/Chest: Effort normal and breath sounds normal. No respiratory distress. She has no wheezes.  Abdominal: Soft. Bowel sounds are normal. She exhibits distension (mild). There is no tenderness. There is no rebound and no guarding.  Hepatomegaly- unchanged from previous  Musculoskeletal: She exhibits no edema.  Lymphadenopathy:    She has no cervical adenopathy.  Neurological: She is alert and oriented to person, place, and time. No cranial nerve deficit. Coordination normal.  Skin: Skin is warm and dry. No erythema.  Psychiatric: She has a normal mood and affect. Her behavior is normal. Thought content normal.  Vitals reviewed.         Assessment & Plan:

## 2017-09-20 NOTE — Patient Instructions (Addendum)
Follow up in 3-4 months to recheck diabetes We'll notify you of your lab results and make any changes if needed START the Bactrim (Trimethoprim Sulfa) twice daily x7 days for both the bladder and the sinuses Drink plenty of fluids Call with any questions or concerns Hang in there!!!

## 2017-09-20 NOTE — Assessment & Plan Note (Signed)
Chronic problems, following w/ GI.

## 2017-09-20 NOTE — Assessment & Plan Note (Signed)
Chronic problem.  PHQ9 score is 8 despite daily Zoloft.  She has a lot going on with her husband who is not doing well.  She is not interested in changing meds at this time.  Will follow.

## 2017-09-20 NOTE — Assessment & Plan Note (Signed)
Ongoing issue for pt.  On Januvia, Metformin, Glipizide.  Hx of poor control.  UTD on eye exam, foot exam done today.  On ARB for renal protection.  Check labs.  Adjust meds prn.

## 2017-09-21 ENCOUNTER — Encounter: Payer: Self-pay | Admitting: General Practice

## 2017-09-22 LAB — URINE CULTURE
MICRO NUMBER:: 90093280
SPECIMEN QUALITY:: ADEQUATE

## 2017-10-03 ENCOUNTER — Other Ambulatory Visit: Payer: Self-pay

## 2017-10-03 ENCOUNTER — Ambulatory Visit: Payer: PPO | Admitting: Vascular Surgery

## 2017-10-03 ENCOUNTER — Encounter: Payer: Self-pay | Admitting: Vascular Surgery

## 2017-10-03 VITALS — BP 115/74 | HR 88 | Temp 98.5°F | Resp 14 | Ht 65.0 in | Wt 209.0 lb

## 2017-10-03 DIAGNOSIS — I83892 Varicose veins of left lower extremities with other complications: Secondary | ICD-10-CM | POA: Diagnosis not present

## 2017-10-03 NOTE — Progress Notes (Signed)
Subjective:     Patient ID: Rachael Buck, female   DOB: 1940/12/26, 77 y.o.   MRN: [id number]  HPI This 77 year old female was referred by Wiliam Ke for evaluation of painful varicosities in the left leg. Patient had a remote history of great saphenous vein stripping of the right leg and multiple phlebectomy's in the early 1970s and has done well on that side. 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 Since her knee replacement surgery in June 2018 she has developed extensive broken veins in the lower third of her left leg extending onto the foot. She develops aching throbbing and burning discomfort in the thigh and calf which worsens as the day progresses and also develops swelling in the ankle as the day progresses. This is affecting her daily living and she wants treatment. She has no history of DVT thrombophlebitis stasis ulcers or bleeding.  Past Medical History:  Diagnosis Date  . Anxiety   . Arthritis   . Asthma   . Cirrhosis, nonalcoholic ([place])   . Colon polyps   . Diabetes mellitus   . Diverticulitis   . GERD (gastroesophageal reflux disease)   . Hyperlipidemia   . Hypertension   . NASH (nonalcoholic steatohepatitis)   . Obesity   . Pneumonia     Social History   Tobacco Use  . Smoking status: Former Research scientist (life sciences)  . Smokeless tobacco: Never Used  Substance Use Topics  . Alcohol use: No    Family History  Problem Relation Age of Onset  . Breast cancer Daughter   . Breast cancer Maternal Aunt   . Diabetes Brother   . Diabetes Sister   . Stomach cancer Mother   . Cancer Other   . GER disease Other   . Obesity Other     Allergies  Allergen Reactions  . Codeine Nausea And Vomiting  . Shrimp [Shellfish Allergy] Swelling     Current Outpatient Medications:  .  albuterol (PROAIR HFA) 108 (90 Base) MCG/ACT inhaler, Inhale 2 puffs into the lungs every 4 (four) hours as needed  for wheezing or shortness of breath., Disp: 1 Inhaler, Rfl: 6 .  ALPRAZolam (XANAX) 0.5 MG tablet, TAKE 1 TABLET BY MOUTH THREE TIMES DAILY, Disp: 90 tablet, Rfl: 0 .  colestipol (COLESTID) 1 g tablet, Take 1 g by mouth daily., Disp: , Rfl:  .  dicyclomine (BENTYL) 20 MG tablet, TAKE 1 TABLET BY MOUTH THREE TIMES DAILY AS NEEDED, Disp: 360 tablet, Rfl: 0 .  diphenhydrAMINE (BENADRYL) 25 MG tablet, Take 25 mg by mouth every 6 (six) hours as needed (for allergies.)., Disp: , Rfl:  .  fenofibrate 160 MG tablet, Take 1 tablet (160 mg total) by mouth daily., Disp: 90 tablet, Rfl: 1 .  glipiZIDE (GLUCOTROL XL) 5 MG 24 hr tablet, TAKE 1 TABLET(5 MG) BY MOUTH DAILY WITH BREAKFAST, Disp: 90 tablet, Rfl: 0 .  hydrochlorothiazide (HYDRODIURIL) 25 MG tablet, TAKE 1 TABLET BY MOUTH EVERY DAY, Disp: 90 tablet, Rfl: 0 .  hydrocortisone 2.5 % cream, Apply topically., Disp: , Rfl:  .  loperamide (IMODIUM) 2 MG capsule, Take 4-6 mg by mouth daily as needed (for IBS symptoms)., Disp: , Rfl:  .  losartan (COZAAR) 50 MG tablet, TAKE 1 TABLET BY MOUTH EVERY DAY, Disp: 90 tablet, Rfl: 0 .  metFORMIN (GLUCOPHAGE) 1000 MG tablet, TAKE 1 TABLET BY MOUTH TWICE DAILY WITH MEALS, Disp: 180 tablet, Rfl: 0 .  omeprazole (PRILOSEC) 40 MG capsule, TAKE 1 CAPSULE BY MOUTH EVERY DAY, Disp: 90 capsule, Rfl: 0 .  ONE TOUCH ULTRA TEST test strip, USE AS DIRECTED (Patient taking differently: TEST BLOOD SUGARS ONCE DAILY IN THE MORNING), Disp: 300 each, Rfl: 0 .  ONETOUCH DELICA LANCETS FINE MISC, 1 Stick by Does not apply route 2 (two) times daily. (Patient taking differently: 1 each by Other route daily. ), Disp: 100 each, Rfl: 3 .  sertraline (ZOLOFT) 50 MG tablet, TAKE 1 TABLET BY MOUTH EVERY DAY, Disp: 90 tablet, Rfl: 0 .  simvastatin (ZOCOR) 80 MG tablet, TAKE 1 TABLET BY MOUTH EVERY DAY, Disp: 90 tablet, Rfl: 0 .  sitaGLIPtin (JANUVIA) 100 MG tablet, Take 1 tablet (100 mg total) by mouth daily., Disp: 30 tablet, Rfl: 6 .   sulfamethoxazole-trimethoprim (BACTRIM DS,SEPTRA DS) 800-160 MG tablet, Take 1 tablet by mouth 2 (two) times daily., Disp: 14 tablet, Rfl: 0 .  aspirin (ASPIRIN CHILDRENS) 81 MG chewable tablet, Chew 1 tablet (81 mg total) by mouth 2 (two) times daily. (Patient not taking: Reported on 10/03/2017), Disp: 60 tablet, Rfl: 0  Vitals:   10/03/17 1019  BP: 115/74  Pulse: 88  Resp: 14  Temp: 98.5 F (36.9 C)  TempSrc: Oral  SpO2: 97%  Weight: 209 lb (94.8 kg)  Height: 5' 5"  (1.651 m)    Body mass index is 34.78 kg/m.         Review of Systems Denies chest pain, she does have a history of asthma and has mild dyspnea on exertion limiting her ambulation. Denies orthopnea, hemoptysis, claudication.    Objective:   Physical Exam BP 115/74 (BP Location: Left Arm, Patient Position: Sitting, Cuff Size: Large)   Pulse 88   Temp 98.5 F (36.9 C) (Oral)   Resp 14   Ht 5' 5"  (1.651 m)   Wt 209 lb (94.8 kg)   SpO2 97%   BMI 34.78 kg/m     Gen.-alert and oriented x3 in no apparent distress HEENT normal for age Lungs no rhonchi or wheezing Cardiovascular regular rhythm no murmurs carotid pulses 3+ palpable no bruits audible Abdomen soft nontender no palpable masses Musculoskeletal free of  major deformities Skin clear -no rashes Neurologic normal Lower extremities 3+ femoral and dorsalis pedis pulses palpable bilaterally with no edema on the right 1+ edema on the left Large bulging varicosities beginning in the medial distal thigh extending into the medial calf and down toward the medial malleolus with pattern of reticular and spider veins lower third left leg extending on the dorsum of foot. No hyperpigmentation or ulceration noted.  Today I performed a bedside SonoSite ultrasound exam which revealed a large caliber left great saphenous vein with gross reflux throughout supplying these painful varicosities. 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 Then  the evaluated for stab phlebectomy and possible foam sclerotherapy Return in 3 months

## 2017-10-18 ENCOUNTER — Other Ambulatory Visit: Payer: Self-pay | Admitting: Family Medicine

## 2017-10-18 NOTE — Telephone Encounter (Signed)
Last OV 09/20/17 Alprazolam last filled 09/12/17 #90 with 0

## 2017-10-25 DIAGNOSIS — H2513 Age-related nuclear cataract, bilateral: Secondary | ICD-10-CM | POA: Diagnosis not present

## 2017-10-26 ENCOUNTER — Encounter: Payer: PPO | Admitting: Vascular Surgery

## 2017-10-26 ENCOUNTER — Encounter (HOSPITAL_COMMUNITY): Payer: PPO

## 2017-11-09 DIAGNOSIS — L720 Epidermal cyst: Secondary | ICD-10-CM | POA: Diagnosis not present

## 2017-11-21 DIAGNOSIS — H2513 Age-related nuclear cataract, bilateral: Secondary | ICD-10-CM | POA: Diagnosis not present

## 2017-11-21 DIAGNOSIS — H25013 Cortical age-related cataract, bilateral: Secondary | ICD-10-CM | POA: Diagnosis not present

## 2017-11-21 DIAGNOSIS — H52203 Unspecified astigmatism, bilateral: Secondary | ICD-10-CM | POA: Diagnosis not present

## 2017-11-29 ENCOUNTER — Other Ambulatory Visit: Payer: Self-pay | Admitting: Family Medicine

## 2017-11-29 NOTE — Telephone Encounter (Signed)
Last OV 09/20/17

## 2017-12-19 ENCOUNTER — Other Ambulatory Visit: Payer: Self-pay | Admitting: Family Medicine

## 2017-12-20 DIAGNOSIS — H25011 Cortical age-related cataract, right eye: Secondary | ICD-10-CM | POA: Diagnosis not present

## 2017-12-20 DIAGNOSIS — H2511 Age-related nuclear cataract, right eye: Secondary | ICD-10-CM | POA: Diagnosis not present

## 2017-12-20 DIAGNOSIS — H25811 Combined forms of age-related cataract, right eye: Secondary | ICD-10-CM | POA: Diagnosis not present

## 2017-12-22 ENCOUNTER — Other Ambulatory Visit: Payer: Self-pay | Admitting: Family Medicine

## 2018-01-05 ENCOUNTER — Other Ambulatory Visit: Payer: Self-pay | Admitting: Family Medicine

## 2018-01-09 ENCOUNTER — Encounter: Payer: Self-pay | Admitting: Vascular Surgery

## 2018-01-09 ENCOUNTER — Ambulatory Visit (HOSPITAL_COMMUNITY)
Admission: RE | Admit: 2018-01-09 | Discharge: 2018-01-09 | Disposition: A | Discharge status: Home / Self Care | Payer: PPO | Source: Ambulatory Visit | Attending: Vascular Surgery | Admitting: Vascular Surgery

## 2018-01-09 ENCOUNTER — Ambulatory Visit: Payer: PPO | Admitting: Vascular Surgery

## 2018-01-09 ENCOUNTER — Other Ambulatory Visit: Payer: Self-pay

## 2018-01-09 VITALS — BP 137/85 | HR 89 | Temp 97.9°F | Resp 14 | Ht 65.5 in | Wt 210.0 lb

## 2018-01-09 DIAGNOSIS — I83892 Varicose veins of left lower extremities with other complications: Secondary | ICD-10-CM

## 2018-01-09 DIAGNOSIS — I8392 Asymptomatic varicose veins of left lower extremity: Secondary | ICD-10-CM | POA: Insufficient documentation

## 2018-01-09 NOTE — Progress Notes (Signed)
Subjective:     Patient ID: Rachael Buck, female   DOB: 1941/05/22, 77 y.o.   MRN: [id number]  HPI This 77 year old female returns for 45-month follow-up regarding her painful varicosities and swelling in the left lower extremity.  She has tried long-leg elastic compression stockings 20-30 mm gradient as well as elevation and ibuprofen with no improvement in her symptoms of pain and swelling.  This is affecting her daily living and she would like treatment.  Long-leg stockings are causing her leg to develop a skin rash at times.  Past Medical History:  Diagnosis Date  . Anxiety   . Arthritis   . Asthma   . Cirrhosis, nonalcoholic ([place])   . Colon polyps   . Diabetes mellitus   . Diverticulitis   . GERD (gastroesophageal reflux disease)   . Hyperlipidemia   . Hypertension   . NASH (nonalcoholic steatohepatitis)   . Obesity   . Pneumonia     Social History   Tobacco Use  . Smoking status: Former Research scientist (life sciences)  . Smokeless tobacco: Never Used  Substance Use Topics  . Alcohol use: No    Family History  Problem Relation Age of Onset  . Breast cancer Daughter   . Breast cancer Maternal Aunt   . Diabetes Brother   . Diabetes Sister   . Stomach cancer Mother   . Cancer Other   . GER disease Other   . Obesity Other     Allergies  Allergen Reactions  . Codeine Nausea And Vomiting  . Shrimp [Shellfish Allergy] Swelling     Current Outpatient Medications:  .  albuterol (PROAIR HFA) 108 (90 Base) MCG/ACT inhaler, Inhale 2 puffs into the lungs every 4 (four) hours as needed for wheezing or shortness of breath., Disp: 1 Inhaler, Rfl: 6 .  ALPRAZolam (XANAX) 0.5 MG tablet, TAKE 1 TABLET BY MOUTH THREE TIMES DAILY, Disp: 90 tablet, Rfl: 1 .  colestipol (COLESTID) 1 g tablet, Take 1 g by mouth daily., Disp: , Rfl:  .  dicyclomine (BENTYL) 20 MG tablet, TAKE 1 TABLET BY MOUTH THREE TIMES DAILY AS NEEDED, Disp: 360 tablet, Rfl: 0 .  diphenhydrAMINE (BENADRYL) 25 MG tablet, Take 25  mg by mouth every 6 (six) hours as needed (for allergies.)., Disp: , Rfl:  .  fenofibrate 160 MG tablet, Take 1 tablet (160 mg total) by mouth daily., Disp: 90 tablet, Rfl: 1 .  glipiZIDE (GLUCOTROL XL) 5 MG 24 hr tablet, TAKE 1 TABLET(5 MG) BY MOUTH DAILY WITH BREAKFAST, Disp: 90 tablet, Rfl: 0 .  hydrochlorothiazide (HYDRODIURIL) 25 MG tablet, TAKE 1 TABLET BY MOUTH EVERY DAY, Disp: 90 tablet, Rfl: 0 .  hydrocortisone 2.5 % cream, Apply topically., Disp: , Rfl:  .  loperamide (IMODIUM) 2 MG capsule, Take 4-6 mg by mouth daily as needed (for IBS symptoms)., Disp: , Rfl:  .  losartan (COZAAR) 50 MG tablet, TAKE 1 TABLET BY MOUTH EVERY DAY, Disp: 90 tablet, Rfl: 0 .  metFORMIN (GLUCOPHAGE) 1000 MG tablet, TAKE 1 TABLET BY MOUTH TWICE DAILY WITH MEALS, Disp: 180 tablet, Rfl: 0 .  omeprazole (PRILOSEC) 40 MG capsule, TAKE 1 CAPSULE BY MOUTH EVERY DAY, Disp: 90 capsule, Rfl: 0 .  ONE TOUCH ULTRA TEST test strip, USE AS DIRECTED (Patient taking differently: TEST BLOOD SUGARS ONCE DAILY IN THE MORNING), Disp: 300 each, Rfl: 0 .  ONETOUCH DELICA LANCETS FINE MISC, 1 Stick by Does not apply route 2 (two) times daily. (Patient taking differently: 1 each by Other  route daily. ), Disp: 100 each, Rfl: 3 .  sertraline (ZOLOFT) 50 MG tablet, TAKE 1 TABLET BY MOUTH EVERY DAY, Disp: 90 tablet, Rfl: 0 .  simvastatin (ZOCOR) 80 MG tablet, TAKE 1 TABLET BY MOUTH EVERY DAY, Disp: 90 tablet, Rfl: 0 .  sitaGLIPtin (JANUVIA) 100 MG tablet, Take 1 tablet (100 mg total) by mouth daily., Disp: 30 tablet, Rfl: 6 .  aspirin (ASPIRIN CHILDRENS) 81 MG chewable tablet, Chew 1 tablet (81 mg total) by mouth 2 (two) times daily. (Patient not taking: Reported on 01/09/2018), Disp: 60 tablet, Rfl: 0 .  sulfamethoxazole-trimethoprim (BACTRIM DS,SEPTRA DS) 800-160 MG tablet, Take 1 tablet by mouth 2 (two) times daily. (Patient not taking: Reported on 01/09/2018), Disp: 14 tablet, Rfl: 0  Vitals:   01/09/18 1406  BP: 137/85  Pulse: 89   Resp: 14  Temp: 97.9 F (36.6 C)  TempSrc: Oral  SpO2: 96%  Weight: 210 lb (95.3 kg)  Height: 5' 5.5" (1.664 m)    Body mass index is 34.41 kg/m.         Review of Systems Denies chest pain, dyspnea on exertion, PND, orthopnea, hemoptysis    Objective:   Physical Exam BP 137/85 (BP Location: Left Arm, Patient Position: Sitting, Cuff Size: Normal)   Pulse 89   Temp 97.9 F (36.6 C) (Oral)   Resp 14   Ht 5' 5.5" (1.664 m)   Wt 210 lb (95.3 kg)   SpO2 96%   BMI 34.41 kg/m   Well-developed well-nourished female no apparent distress alert and oriented x3 Lungs no rhonchi or wheezing Left leg with large bulging varicosities from the knee to the medial malleolus with pattern of spider and reticular veins lower third anteriorly.  No hyperpigmentation or ulceration noted.  Today ordered a venous duplex exam of the left leg which I reviewed and interpreted.  There is no DVT.  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  She would like these veins treated and symptomatic relief    Plan:     Patient needs #1 laser ablation left great saphenous vein to be followed by 21-month waiting.  And then be evaluated for possible stab phlebectomy and foam sclerotherapy We will begin with precertification to perform left great saphenous vein laser ablation procedure to relieve her symptoms

## 2018-01-14 ENCOUNTER — Encounter (HOSPITAL_BASED_OUTPATIENT_CLINIC_OR_DEPARTMENT_OTHER): Payer: Self-pay | Admitting: Emergency Medicine

## 2018-01-14 ENCOUNTER — Emergency Department (HOSPITAL_BASED_OUTPATIENT_CLINIC_OR_DEPARTMENT_OTHER)
Admission: EM | Admit: 2018-01-14 | Discharge: 2018-01-14 | Disposition: A | Discharge status: Home / Self Care | Payer: PPO | Attending: Emergency Medicine | Admitting: Emergency Medicine

## 2018-01-14 ENCOUNTER — Other Ambulatory Visit: Payer: Self-pay

## 2018-01-14 ENCOUNTER — Emergency Department (HOSPITAL_BASED_OUTPATIENT_CLINIC_OR_DEPARTMENT_OTHER): Payer: PPO

## 2018-01-14 DIAGNOSIS — Z79899 Other long term (current) drug therapy: Secondary | ICD-10-CM | POA: Diagnosis not present

## 2018-01-14 DIAGNOSIS — J45901 Unspecified asthma with (acute) exacerbation: Secondary | ICD-10-CM | POA: Diagnosis not present

## 2018-01-14 DIAGNOSIS — J40 Bronchitis, not specified as acute or chronic: Secondary | ICD-10-CM

## 2018-01-14 DIAGNOSIS — Z7982 Long term (current) use of aspirin: Secondary | ICD-10-CM | POA: Diagnosis not present

## 2018-01-14 DIAGNOSIS — R000 Tachycardia, unspecified: Secondary | ICD-10-CM | POA: Diagnosis not present

## 2018-01-14 DIAGNOSIS — J189 Pneumonia, unspecified organism: Secondary | ICD-10-CM | POA: Diagnosis not present

## 2018-01-14 DIAGNOSIS — Z7984 Long term (current) use of oral hypoglycemic drugs: Secondary | ICD-10-CM | POA: Insufficient documentation

## 2018-01-14 DIAGNOSIS — Z87891 Personal history of nicotine dependence: Secondary | ICD-10-CM | POA: Insufficient documentation

## 2018-01-14 DIAGNOSIS — E119 Type 2 diabetes mellitus without complications: Secondary | ICD-10-CM | POA: Insufficient documentation

## 2018-01-14 DIAGNOSIS — R0682 Tachypnea, not elsewhere classified: Secondary | ICD-10-CM | POA: Diagnosis not present

## 2018-01-14 DIAGNOSIS — I10 Essential (primary) hypertension: Secondary | ICD-10-CM | POA: Insufficient documentation

## 2018-01-14 DIAGNOSIS — Z96652 Presence of left artificial knee joint: Secondary | ICD-10-CM | POA: Diagnosis not present

## 2018-01-14 DIAGNOSIS — R0602 Shortness of breath: Secondary | ICD-10-CM | POA: Diagnosis not present

## 2018-01-14 DIAGNOSIS — R05 Cough: Secondary | ICD-10-CM | POA: Diagnosis not present

## 2018-01-14 LAB — BASIC METABOLIC PANEL
Anion gap: 14 (ref 5–15)
BUN: 8 mg/dL (ref 6–20)
CO2: 26 mmol/L (ref 22–32)
Calcium: 9.1 mg/dL (ref 8.9–10.3)
Chloride: 92 mmol/L — ABNORMAL LOW (ref 101–111)
Creatinine, Ser: 0.53 mg/dL (ref 0.44–1.00)
GFR calc Af Amer: >60 mL/min (ref >60)
GFR calc non Af Amer: >60 mL/min (ref >60)
Glucose, Bld: 188 mg/dL — ABNORMAL HIGH (ref 65–99)
Potassium: 3 mmol/L — ABNORMAL LOW (ref 3.5–5.1)
Sodium: 132 mmol/L — ABNORMAL LOW (ref 135–145)

## 2018-01-14 LAB — CBC WITH DIFFERENTIAL/PLATELET
Basophils Absolute: 0 10*3/uL (ref 0.0–0.1)
Basophils Relative: 0 %
Eosinophils Absolute: 0 10*3/uL (ref 0.0–0.7)
Eosinophils Relative: 0 %
HCT: 35.2 % — ABNORMAL LOW (ref 36.0–46.0)
Hemoglobin: 12.2 g/dL (ref 12.0–15.0)
Lymphocytes Relative: 19 %
Lymphs Abs: 1.4 10*3/uL (ref 0.7–4.0)
MCH: 28.6 pg (ref 26.0–34.0)
MCHC: 34.7 g/dL (ref 30.0–36.0)
MCV: 82.4 fL (ref 78.0–100.0)
Monocytes Absolute: 0.8 10*3/uL (ref 0.1–1.0)
Monocytes Relative: 10 %
Neutro Abs: 5.2 10*3/uL (ref 1.7–7.7)
Neutrophils Relative %: 71 %
Platelets: 62 10*3/uL — ABNORMAL LOW (ref 150–400)
RBC: 4.27 MIL/uL (ref 3.87–5.11)
RDW: 15.1 % (ref 11.5–15.5)
WBC: 7.5 10*3/uL (ref 4.0–10.5)

## 2018-01-14 LAB — CBG MONITORING, ED: Glucose-Capillary: 210 mg/dL — ABNORMAL HIGH (ref 65–99)

## 2018-01-14 MED ORDER — ALBUTEROL SULFATE HFA 108 (90 BASE) MCG/ACT IN AERS
2.0000 | INHALATION_SPRAY | Freq: Once | RESPIRATORY_TRACT | Status: AC
  Administered 2018-01-14: 2 via RESPIRATORY_TRACT
  Filled 2018-01-14: qty 6.7

## 2018-01-14 MED ORDER — IPRATROPIUM-ALBUTEROL 0.5-2.5 (3) MG/3ML IN SOLN
3.0000 mL | Freq: Four times a day (QID) | RESPIRATORY_TRACT | Status: DC
  Administered 2018-01-14: 3 mL via RESPIRATORY_TRACT
  Filled 2018-01-14: qty 3

## 2018-01-14 MED ORDER — AEROCHAMBER PLUS FLO-VU MEDIUM MISC
1.0000 | Freq: Once | Status: AC
  Administered 2018-01-14: 1
  Filled 2018-01-14: qty 1

## 2018-01-14 MED ORDER — ALBUTEROL SULFATE (2.5 MG/3ML) 0.083% IN NEBU
5.0000 mg | INHALATION_SOLUTION | Freq: Once | RESPIRATORY_TRACT | Status: AC
  Administered 2018-01-14: 5 mg via RESPIRATORY_TRACT
  Filled 2018-01-14: qty 6

## 2018-01-14 MED ORDER — SODIUM CHLORIDE 0.9 % IV SOLN
INTRAVENOUS | Status: DC
  Administered 2018-01-14: 12:00:00 via INTRAVENOUS

## 2018-01-14 MED ORDER — METHYLPREDNISOLONE SODIUM SUCC 125 MG IJ SOLR
80.0000 mg | Freq: Once | INTRAMUSCULAR | Status: AC
  Administered 2018-01-14: 80 mg via INTRAVENOUS
  Filled 2018-01-14: qty 2

## 2018-01-14 MED ORDER — ACETAMINOPHEN 500 MG PO TABS
1000.0000 mg | ORAL_TABLET | Freq: Once | ORAL | Status: AC
  Administered 2018-01-14: 1000 mg via ORAL
  Filled 2018-01-14: qty 2

## 2018-01-14 MED ORDER — AZITHROMYCIN 250 MG PO TABS: 250.0000 mg | ORAL_TABLET | Freq: Every day | ORAL | 0 refills | Status: DC

## 2018-01-14 MED ORDER — CEFTRIAXONE SODIUM 1 G IJ SOLR
1.0000 g | Freq: Once | INTRAMUSCULAR | Status: AC
  Administered 2018-01-14: 1 g via INTRAVENOUS
  Filled 2018-01-14: qty 10

## 2018-01-14 MED ORDER — IPRATROPIUM-ALBUTEROL 0.5-2.5 (3) MG/3ML IN SOLN
RESPIRATORY_TRACT | Status: AC
  Administered 2018-01-14: 3 mL
  Filled 2018-01-14: qty 3

## 2018-01-14 MED ORDER — PREDNISONE 20 MG PO TABS: 40.0000 mg | ORAL_TABLET | Freq: Every day | ORAL | 0 refills | Status: DC

## 2018-01-14 MED ORDER — AZITHROMYCIN 250 MG PO TABS
500.0000 mg | ORAL_TABLET | Freq: Once | ORAL | Status: AC
  Administered 2018-01-14: 500 mg via ORAL
  Filled 2018-01-14: qty 2

## 2018-01-14 MED ORDER — AMOXICILLIN 500 MG PO CAPS: 500.0000 mg | ORAL_CAPSULE | Freq: Three times a day (TID) | ORAL | 0 refills | Status: DC

## 2018-01-14 MED ORDER — ALBUTEROL SULFATE (2.5 MG/3ML) 0.083% IN NEBU
INHALATION_SOLUTION | RESPIRATORY_TRACT | Status: AC
  Administered 2018-01-14: 2.5 mg
  Filled 2018-01-14: qty 3

## 2018-01-14 MED ORDER — POTASSIUM CHLORIDE CRYS ER 20 MEQ PO TBCR
40.0000 meq | EXTENDED_RELEASE_TABLET | Freq: Once | ORAL | Status: AC
  Administered 2018-01-14: 40 meq via ORAL
  Filled 2018-01-14: qty 2

## 2018-01-14 NOTE — ED Notes (Signed)
MDI with spacer instructed and given to patient to take home.

## 2018-01-14 NOTE — ED Triage Notes (Signed)
Patient states that she has had SOB and dyspnea at home for the last 4 days - she reports that it is so bad now that she can not sleep

## 2018-01-14 NOTE — ED Provider Notes (Signed)
[facility] EMERGENCY DEPARTMENT Provider Note   CSN: [id number] Arrival date & time: 01/14/18  1031     History   Chief Complaint Chief Complaint  Patient presents with  . Cough    HPI Rachael Buck is a 77 y.o. female.  HPI   76yF with cough and dyspnea. Onset about 4d ago and progressing. Didn't sleep much at all last night. Today also subjective fever. Cough is nonproductive. No unusual leg pain or swelling. No vomiting. Husband recently with respiratory symptoms but improving.   Past Medical History:  Diagnosis Date  . Anxiety   . Arthritis   . Asthma   . Cirrhosis, nonalcoholic ([place])   . Colon polyps   . Diabetes mellitus   . Diverticulitis   . GERD (gastroesophageal reflux disease)   . Hyperlipidemia   . Hypertension   . NASH (nonalcoholic steatohepatitis)   . Obesity   . Pneumonia     Patient Active Problem List   Diagnosis Date Noted  . 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 Thrombocytopenia ([place]) 02/15/2017  . Left knee pain 11/24/2015  . Varicose veins of left lower extremity with complications 00/76/2263  . Age-related cognitive decline 06/02/2015  . Acute pharyngitis 10/16/2014  . Fatigue 07/19/2014  . Orthostatic hypotension 05/14/2013  . Bronchitis with asthma, acute 01/29/2013  . Sinusitis 01/29/2013  . Routine general medical examination at a health care facility 12/20/2012  . Abdominal distention 10/23/2012  . Nail abnormality 09/04/2012  . Need for hepatitis B vaccination 08/10/2012  . Bleeding disorder ([place]) 05/08/2012  . Varices, esophageal ([place]) 03/21/2012  . NASH (nonalcoholic steatohepatitis) 03/21/2012  . HTN (hypertension) 12/21/2011  . Abdominal pain 05/31/2011  . Depression 03/28/2011  . IBS (irritable bowel syndrome) 03/23/2011  . INTRINSIC ASTHMA, UNSPECIFIED 08/27/2010  . DYSPNEA ON EXERTION 07/27/2010  . Uncontrolled diabetes mellitus ([place]) 07/24/2010  . MIXED HYPERLIPIDEMIA 07/24/2010  . OBESITY,  UNSPECIFIED 07/24/2010  . ALLERGIC RHINITIS CAUSE UNSPECIFIED 07/24/2010  . REFLUX ESOPHAGITIS 07/24/2010  . PAIN IN JOINT PELVIC REGION AND THIGH 07/24/2010    Past Surgical History:  Procedure Laterality Date  . CHOLECYSTECTOMY  1981  . OOPHORECTOMY  1973  . PARTIAL HYSTERECTOMY  1972  . ROTATOR CUFF REPAIR     bilateral. 2006, 2008  . 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 TOTAL KNEE ARTHROPLASTY Left 02/18/2017   Procedure: LEFT TOTAL KNEE ARTHROPLASTY;  Surgeon: Netta Cedars, MD;  Location: [facility];  Service: Orthopedics;  Laterality: Left;  Marland Kitchen VEIN SURGERY       OB History   None      Home Medications    Prior to Admission medications   Medication Sig Start Date End Date Taking? Authorizing Provider  albuterol (PROAIR HFA) 108 (90 Base) MCG/ACT inhaler Inhale 2 puffs into the lungs every 4 (four) hours as needed for wheezing or shortness of breath. 09/02/16   Midge Minium, MD  ALPRAZolam Duanne Moron) 0.5 MG tablet TAKE 1 TABLET BY MOUTH THREE TIMES DAILY 11/30/17   Midge Minium, MD  aspirin (ASPIRIN CHILDRENS) 81 MG chewable tablet Chew 1 tablet (81 mg total) by mouth 2 (two) times daily. Patient not taking: Reported on 01/09/2018 02/18/17   Netta Cedars, MD  colestipol (COLESTID) 1 g tablet Take 1 g by mouth daily.    [provider]  dicyclomine (BENTYL) 20 MG tablet TAKE 1 TABLET BY MOUTH THREE TIMES DAILY AS NEEDED 01/05/18   Midge Minium, MD  diphenhydrAMINE (BENADRYL)  25 MG tablet Take 25 mg by mouth every 6 (six) hours as needed (for allergies.).    [provider]  fenofibrate 160 MG tablet Take 1 tablet (160 mg total) by mouth daily. 04/16/16   Midge Minium, MD  glipiZIDE (GLUCOTROL XL) 5 MG 24 hr tablet TAKE 1 TABLET(5 MG) BY MOUTH DAILY WITH BREAKFAST 12/22/17   Midge Minium, MD  hydrochlorothiazide (HYDRODIURIL) 25 MG tablet TAKE 1 TABLET BY MOUTH EVERY DAY 12/19/17   Midge Minium, MD  hydrocortisone 2.5 % cream  Apply topically.    [provider]  loperamide (IMODIUM) 2 MG capsule Take 4-6 mg by mouth daily as needed (for IBS symptoms).    [provider]  losartan (COZAAR) 50 MG tablet TAKE 1 TABLET BY MOUTH EVERY DAY 12/19/17   Midge Minium, MD  metFORMIN (GLUCOPHAGE) 1000 MG tablet TAKE 1 TABLET BY MOUTH TWICE DAILY WITH MEALS 09/12/17   Midge Minium, MD  omeprazole (PRILOSEC) 40 MG capsule TAKE 1 CAPSULE BY MOUTH EVERY DAY 01/05/18   Midge Minium, MD  ONE TOUCH ULTRA TEST test strip USE AS DIRECTED Patient taking differently: TEST BLOOD SUGARS ONCE DAILY IN THE MORNING 10/26/16   Midge Minium, MD  [facility] DELICA LANCETS FINE MISC 1 Stick by Does not apply route 2 (two) times daily. Patient taking differently: 1 each by Other route daily.  05/14/13   Midge Minium, MD  sertraline (ZOLOFT) 50 MG tablet TAKE 1 TABLET BY MOUTH EVERY DAY 01/05/18   Midge Minium, MD  simvastatin (ZOCOR) 80 MG tablet TAKE 1 TABLET BY MOUTH EVERY DAY 01/05/18   Midge Minium, MD  sitaGLIPtin (JANUVIA) 100 MG tablet Take 1 tablet (100 mg total) by mouth daily. 01/28/17   Midge Minium, MD  sulfamethoxazole-trimethoprim (BACTRIM DS,SEPTRA DS) 800-160 MG tablet Take 1 tablet by mouth 2 (two) times daily. Patient not taking: Reported on 01/09/2018 09/20/17   Midge Minium, MD    Family History Family History  Problem Relation Age of Onset  . Breast cancer Daughter   . Breast cancer Maternal Aunt   . Diabetes Brother   . Diabetes Sister   . Stomach cancer Mother   . Cancer Other   . GER disease Other   . Obesity Other     Social History Social History   Tobacco Use  . Smoking status: Former Research scientist (life sciences)  . Smokeless tobacco: Never Used  Substance Use Topics  . Alcohol use: No  . Drug use: No     Allergies   Codeine and Shrimp [shellfish allergy]   Review of Systems Review of Systems  All systems reviewed and negative, other than as noted in  HPI.  Physical Exam Updated Vital Signs BP 126/68   Pulse (!) 120   Temp (!) 100.8 F (38.2 C) (Oral)   Resp 19   Ht 5' 5.5" (1.664 m)   Wt 95.3 kg (210 lb)   SpO2 91%   BMI 34.41 kg/m   Physical Exam  Constitutional: She appears well-developed and well-nourished. No distress.  HENT:  Head: Normocephalic and atraumatic.  Eyes: Conjunctivae are normal. Right eye exhibits no discharge. Left eye exhibits no discharge.  Neck: Neck supple.  Cardiovascular: Regular rhythm and normal heart sounds. Exam reveals no gallop and no friction rub.  No murmur heard. tachycardia  Pulmonary/Chest: Effort normal. No respiratory distress. She has wheezes.  Mild tachypnea.   Abdominal: Soft. She exhibits no distension. There  is no tenderness.  Musculoskeletal: She exhibits no edema or tenderness.  Neurological: She is alert.  Skin: Skin is warm and dry.  Psychiatric: She has a normal mood and affect. Her behavior is normal. Thought content normal.  Nursing note and vitals reviewed.    ED Treatments / Results  Labs (all labs ordered are listed, but only abnormal results are displayed) Labs Reviewed  CBC WITH DIFFERENTIAL/PLATELET - Abnormal; Notable for the following components:      Result Value   HCT 35.2 (*)    Platelets 62 (*)    All other components within normal limits  BASIC METABOLIC PANEL - Abnormal; Notable for the following components:   Sodium 132 (*)    Potassium 3.0 (*)    Chloride 92 (*)    Glucose, Bld 188 (*)    All other components within normal limits  CBG MONITORING, ED - Abnormal; Notable for the following components:   Glucose-Capillary 210 (*)    All other components within normal limits    EKG EKG Interpretation  Date/Time:  Saturday Jan 14 2018 10:46:45 EDT Ventricular Rate:  110 PR Interval:    QRS Duration: 140 QT Interval:  353 QTC Calculation: 478 R Axis:   93 Text Interpretation:  Sinus tachycardia Consider left atrial enlargement RBBB and  LPFB Confirmed by Virgel Manifold [phone]) on 01/14/2018 11:01:12 AM Also confirmed by Virgel Manifold [phone]), editor Philomena Doheny [phone])  on 01/14/2018 11:23:58 AM   Radiology Dg Chest 2 View  Result Date: 01/14/2018 CLINICAL DATA:  Cough and shortness of breath for the past week. EXAM: CHEST - 2 VIEW COMPARISON:  Chest x-ray dated December 04, 2013. FINDINGS: The heart size and mediastinal contours are within normal limits. Normal pulmonary vascularity. No focal consolidation, pleural effusion, or pneumothorax. No acute osseous abnormality. IMPRESSION: No active cardiopulmonary disease. Electronically Signed   By: Titus Dubin M.D.   On: 01/14/2018 11:22    Procedures Procedures (including critical care time)  Medications Ordered in ED Medications  0.9 %  sodium chloride infusion ( Intravenous New Bag/Given 01/14/18 1152)  cefTRIAXone (ROCEPHIN) 1 g in sodium chloride 0.9 % 100 mL IVPB (1 g Intravenous New Bag/Given 01/14/18 1236)  ipratropium-albuterol (DUONEB) 0.5-2.5 (3) MG/3ML nebulizer solution 3 mL (3 mLs Nebulization Given 01/14/18 1212)  albuterol (PROVENTIL) (2.5 MG/3ML) 0.083% nebulizer solution (2.5 mg  Given 01/14/18 1042)  ipratropium-albuterol (DUONEB) 0.5-2.5 (3) MG/3ML nebulizer solution (3 mLs  Given 01/14/18 1042)  albuterol (PROVENTIL) (2.5 MG/3ML) 0.083% nebulizer solution 5 mg (5 mg Nebulization Given 01/14/18 1119)  acetaminophen (TYLENOL) tablet 1,000 mg (1,000 mg Oral Given 01/14/18 1151)  azithromycin (ZITHROMAX) tablet 500 mg (500 mg Oral Given 01/14/18 1235)  methylPREDNISolone sodium succinate (SOLU-MEDROL) 125 mg/2 mL injection 80 mg (80 mg Intravenous Given 01/14/18 1151)  potassium chloride SA (K-DUR,KLOR-CON) CR tablet 40 mEq (40 mEq Oral Given 01/14/18 1235)     Initial Impression / Assessment and Plan / ED Course  I have reviewed the triage vital signs and the nursing notes.  Pertinent labs & imaging results that were available during my care of the patient were  reviewed by me and considered in my medical decision making (see chart for details).     76yF with dyspnea/cough. Probably viral bronchitis. Concern about WOB being a little increased and mild hypoxemia. Recommended admission. Pt is very hesitant to do this though because she is insistent on being home with her husband. Treated with steroids, bronchodilators in ED. Will send home with  albuterol MDI. CXR w/o focal infiltrate. With her fever, clinical appearance and since she insists on discharge, will treat for possible pneumonia.   Final Clinical Impressions(s) / ED Diagnoses   Final diagnoses:  Community acquired pneumonia, unspecified laterality  Bronchitis    ED Discharge Orders    None       Virgel Manifold, MD 01/18/18 [phone]

## 2018-01-16 ENCOUNTER — Encounter: Payer: Self-pay | Admitting: Family Medicine

## 2018-01-16 ENCOUNTER — Telehealth: Payer: Self-pay

## 2018-01-16 ENCOUNTER — Ambulatory Visit (INDEPENDENT_AMBULATORY_CARE_PROVIDER_SITE_OTHER): Payer: PPO | Admitting: Family Medicine

## 2018-01-16 VITALS — BP 130/62 | HR 99 | Temp 98.4°F | Resp 20 | Ht 65.5 in | Wt 211.0 lb

## 2018-01-16 DIAGNOSIS — E876 Hypokalemia: Secondary | ICD-10-CM | POA: Diagnosis not present

## 2018-01-16 DIAGNOSIS — R062 Wheezing: Secondary | ICD-10-CM

## 2018-01-16 DIAGNOSIS — Z09 Encounter for follow-up examination after completed treatment for conditions other than malignant neoplasm: Secondary | ICD-10-CM

## 2018-01-16 DIAGNOSIS — D696 Thrombocytopenia, unspecified: Secondary | ICD-10-CM

## 2018-01-16 DIAGNOSIS — J440 Chronic obstructive pulmonary disease with acute lower respiratory infection: Secondary | ICD-10-CM | POA: Diagnosis not present

## 2018-01-16 DIAGNOSIS — J209 Acute bronchitis, unspecified: Secondary | ICD-10-CM

## 2018-01-16 MED ORDER — IPRATROPIUM-ALBUTEROL 0.5-2.5 (3) MG/3ML IN SOLN: 3.0000 mL | Freq: Four times a day (QID) | RESPIRATORY_TRACT | 2 refills | Status: DC | PRN

## 2018-01-16 MED ORDER — IPRATROPIUM-ALBUTEROL 0.5-2.5 (3) MG/3ML IN SOLN
3.0000 mL | Freq: Once | RESPIRATORY_TRACT | Status: AC
  Administered 2018-01-16: 3 mL via RESPIRATORY_TRACT

## 2018-01-16 NOTE — Progress Notes (Addendum)
[facility] [address], [place], [place] [address] [phone]  Date:  01/16/2018   Name:  Rachael Buck   DOB:  [birth year]   MRN:  [id number]  PCP:  Midge Minium, MD    Chief Complaint: Pneumonia (follow up, low oxygen, shortness of breath, worse when walking)   History of Present Illness:  Rachael Buck is a 77 y.o. very pleasant female patient who presents with the following:  ER follow-up today. Pt had come infor her husband's appt- however she was also sick, in the ER 2 days ago and I noted that she was wheezing so we worked her in for an appt today  She was seen in the ER on 5/18 with CAP - however notes are not complete yet so I am not sure of all details.  Pt is a good historian and can fill me in She notes that her oxygen level was 89% when she presented to the ER She had a CXR, per pt she had pneumonia She was treated with steroids and K, several nebs, and was discharged to home with amox, azithromycin and prednisone.  She has 5 days of po prednisone to take She will get up around every 2 hours and use her vaporizer- this is a humidifier, she does not have a neb machine  She is feeling a bit better than on the 18th but still not back to normal She is a former smoker  Dg Chest 2 View  Result Date: 01/14/2018 CLINICAL DATA:  Cough and shortness of breath for the past week. EXAM: CHEST - 2 VIEW COMPARISON:  Chest x-ray dated December 04, 2013. FINDINGS: The heart size and mediastinal contours are within normal limits. Normal pulmonary vascularity. No focal consolidation, pleural effusion, or pneumothorax. No acute osseous abnormality. IMPRESSION: No active cardiopulmonary disease. Electronically Signed   By: Titus Dubin M.D.   On: 01/14/2018 11:22   She notes low platelets for a year or so- we are not quite sure why this is the case She does mention that she lost her youngest son at 2yo due to low  platelets - we are not sure if this may be connected ? 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 but her liver function is normal   Lab Results  Component Value Date   WBC 7.5 01/14/2018   HGB 12.2 01/14/2018   HCT 35.2 (L) 01/14/2018   MCV 82.4 01/14/2018   PLT 62 (L) 01/14/2018     Chemistry      Component Value Date/Time   NA 132 (L) 01/14/2018 1112   NA 127 (L) 02/14/2017 1335   K 3.0 (L) 01/14/2018 1112   K 3.6 02/14/2017 1335   CL 92 (L) 01/14/2018 1112   CL 92 (L) 02/14/2017 1335   CO2 26 01/14/2018 1112   CO2 28 02/14/2017 1335   BUN 8 01/14/2018 1112   BUN 7 (L) 02/14/2017 1335   CREATININE 0.53 01/14/2018 1112   CREATININE 0.47 (L) 02/14/2017 1335   CREATININE 0.56 07/24/2014 1204      Component Value Date/Time   CALCIUM 9.1 01/14/2018 1112   CALCIUM 9.3 02/14/2017 1335   ALKPHOS 61 09/20/2017 1056   ALKPHOS 68 02/14/2017 1335   AST 16 09/20/2017 1056   AST 29 02/14/2017 1335   ALT 13 09/20/2017 1056   ALT 20 02/14/2017 1335   BILITOT 1.2 09/20/2017 1056   BILITOT 0.7 02/14/2017 1335  Lab Results  Component Value Date   HGBA1C 7.4 (H) 09/20/2017     Patient Active Problem List   Diagnosis Date Noted  . 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 Thrombocytopenia ([facility]) 02/15/2017  . Left knee pain 11/24/2015  . Varicose veins of left lower extremity with complications 51/09/5850  . Age-related cognitive decline 06/02/2015  . Acute pharyngitis 10/16/2014  . Fatigue 07/19/2014  . Orthostatic hypotension 05/14/2013  . Bronchitis with asthma, acute 01/29/2013  . Sinusitis 01/29/2013  . Routine general medical examination at a health care facility 12/20/2012  . Abdominal distention 10/23/2012  . Nail abnormality 09/04/2012  . Need for hepatitis B vaccination 08/10/2012  . Bleeding disorder ([facility]) 05/08/2012  . Varices, esophageal ([facility]) 03/21/2012  . NASH (nonalcoholic steatohepatitis) 03/21/2012  . HTN (hypertension) 12/21/2011  . Abdominal pain  05/31/2011  . Depression 03/28/2011  . IBS (irritable bowel syndrome) 03/23/2011  . INTRINSIC ASTHMA, UNSPECIFIED 08/27/2010  . DYSPNEA ON EXERTION 07/27/2010  . Uncontrolled diabetes mellitus ([facility]) 07/24/2010  . MIXED HYPERLIPIDEMIA 07/24/2010  . OBESITY, UNSPECIFIED 07/24/2010  . ALLERGIC RHINITIS CAUSE UNSPECIFIED 07/24/2010  . REFLUX ESOPHAGITIS 07/24/2010  . PAIN IN JOINT PELVIC REGION AND THIGH 07/24/2010    Past Medical History:  Diagnosis Date  . Anxiety   . Arthritis   . Asthma   . 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 Colon polyps   . Diabetes mellitus   . Diverticulitis   . GERD (gastroesophageal reflux disease)   . Hyperlipidemia   . Hypertension   . NASH (nonalcoholic steatohepatitis)   . Obesity   . Pneumonia     Past Surgical History:  Procedure Laterality Date  . CHOLECYSTECTOMY  1981  . OOPHORECTOMY  1973  . PARTIAL HYSTERECTOMY  1972  . ROTATOR CUFF REPAIR     bilateral. 2006, 2008  . 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 TOTAL KNEE ARTHROPLASTY Left 02/18/2017   Procedure: LEFT TOTAL KNEE ARTHROPLASTY;  Surgeon: Netta Cedars, MD;  Location: [facility];  Service: Orthopedics;  Laterality: Left;  Marland Kitchen VEIN SURGERY      Social History   Tobacco Use  . Smoking status: Former Research scientist (life sciences)  . Smokeless tobacco: Never Used  Substance Use Topics  . Alcohol use: No  . Drug use: No    Family History  Problem Relation Age of Onset  . Breast cancer Daughter   . Breast cancer Maternal Aunt   . Diabetes Brother   . Diabetes Sister   . Stomach cancer Mother   . Cancer Other   . GER disease Other   . Obesity Other     Allergies  Allergen Reactions  . Codeine Nausea And Vomiting  . Shrimp [Shellfish Allergy] Swelling    Medication list has been reviewed and updated.  Current Outpatient Medications on File Prior to Visit  Medication Sig Dispense Refill  . albuterol (PROAIR HFA) 108 (90 Base) MCG/ACT inhaler Inhale 2 puffs into the lungs every 4 (four)  hours as needed for wheezing or shortness of breath. 1 Inhaler 6  . ALPRAZolam (XANAX) 0.5 MG tablet TAKE 1 TABLET BY MOUTH THREE TIMES DAILY 90 tablet 1  . amoxicillin (AMOXIL) 500 MG capsule Take 1 capsule (500 mg total) by mouth 3 (three) times daily. 21 capsule 0  . aspirin (ASPIRIN CHILDRENS) 81 MG chewable tablet Chew 1 tablet (81 mg total) by mouth 2 (two) times daily. 60 tablet 0  . azithromycin (ZITHROMAX Z-PAK) 250 MG tablet Take 1 tablet (250 mg  total) by mouth daily. 2 pills (500mg ) day 1 then 1 pill (250mg ) days 2-5. 6 tablet 0  . colestipol (COLESTID) 1 g tablet Take 1 g by mouth daily.    Marland Kitchen dicyclomine (BENTYL) 20 MG tablet TAKE 1 TABLET BY MOUTH THREE TIMES DAILY AS NEEDED 360 tablet 0  . diphenhydrAMINE (BENADRYL) 25 MG tablet Take 25 mg by mouth every 6 (six) hours as needed (for allergies.).    Marland Kitchen fenofibrate 160 MG tablet Take 1 tablet (160 mg total) by mouth daily. 90 tablet 1  . glipiZIDE (GLUCOTROL XL) 5 MG 24 hr tablet TAKE 1 TABLET(5 MG) BY MOUTH DAILY WITH BREAKFAST 90 tablet 0  . hydrochlorothiazide (HYDRODIURIL) 25 MG tablet TAKE 1 TABLET BY MOUTH EVERY DAY 90 tablet 0  . hydrocortisone 2.5 % cream Apply topically.    Marland Kitchen loperamide (IMODIUM) 2 MG capsule Take 4-6 mg by mouth daily as needed (for IBS symptoms).    . losartan (COZAAR) 50 MG tablet TAKE 1 TABLET BY MOUTH EVERY DAY 90 tablet 0  . metFORMIN (GLUCOPHAGE) 1000 MG tablet TAKE 1 TABLET BY MOUTH TWICE DAILY WITH MEALS 180 tablet 0  . omeprazole (PRILOSEC) 40 MG capsule TAKE 1 CAPSULE BY MOUTH EVERY DAY 90 capsule 0  . ONE TOUCH ULTRA TEST test strip USE AS DIRECTED (Patient taking differently: TEST BLOOD SUGARS ONCE DAILY IN THE MORNING) 300 each 0  . ONETOUCH DELICA LANCETS FINE MISC 1 Stick by Does not apply route 2 (two) times daily. (Patient taking differently: 1 each by Other route daily. ) 100 each 3  . predniSONE (DELTASONE) 20 MG tablet Take 2 tablets (40 mg total) by mouth daily. 8 tablet 0  . sertraline  (ZOLOFT) 50 MG tablet TAKE 1 TABLET BY MOUTH EVERY DAY 90 tablet 0  . simvastatin (ZOCOR) 80 MG tablet TAKE 1 TABLET BY MOUTH EVERY DAY 90 tablet 0  . sitaGLIPtin (JANUVIA) 100 MG tablet Take 1 tablet (100 mg total) by mouth daily. 30 tablet 6  . sulfamethoxazole-trimethoprim (BACTRIM DS,SEPTRA DS) 800-160 MG tablet Take 1 tablet by mouth 2 (two) times daily. 14 tablet 0   No current facility-administered medications on file prior to visit.     Review of Systems:  As per HPI- otherwise negative. She has 5 days of prednisone total  No fever at this time but she did have a temp on 5/18   Physical Examination: Vitals:   01/16/18 1455  BP: 130/62  Pulse: 99  Resp: 20  Temp: 98.4 F (36.9 C)  SpO2: 95%   Vitals:   01/16/18 1455  Weight: 211 lb (95.7 kg)  Height: 5' 5.5" (1.664 m)   Body mass index is 34.58 kg/m. Ideal Body Weight: Weight in (lb) to have BMI = 25: 152.2  GEN: WDWN, NAD, Non-toxic, A & O x 3, obese, looks well but can be heard wheezing at times  HEENT: Atraumatic, Normocephalic. Neck supple. No masses, No LAD.  Bilateral TM wnl, oropharynx normal.  PEERL,EOMI.   Ears and Nose: No external deformity. CV: RRR, No M/G/R. No JVD. No thrill. No extra heart sounds. PULM: diffuse mild wheezing and ronchi bilaterally. No retractions. No resp. distress. No accessory muscle use. ABD: S, NT, ND EXTR: No c/c/e NEURO Normal gait.  PSYCH: Normally interactive. Conversant. Not depressed or anxious appearing.  Calm demeanor.   duoneb given- moving better air, less wheezing after treatment   Assessment and Plan: Wheezing - Plan: ipratropium-albuterol (DUONEB) 0.5-2.5 (3) MG/3ML nebulizer solution 3  mL, ipratropium-albuterol (DUONEB) 0.5-2.5 (3) MG/3ML SOLN  Acute bronchitis with COPD ([facility]) - Plan: ipratropium-albuterol (DUONEB) 0.5-2.5 (3) MG/3ML nebulizer solution 3 mL, Basic metabolic panel, ipratropium-albuterol (DUONEB) 0.5-2.5 (3) MG/3ML  SOLN  Hypokalemia  Thrombocytopenia ([facility]) - Plan: Ambulatory referral to Hematology  Following up from ER visit today Given a duoneb treatment for persistent wheezing, and rx for same. She will purchase a neb machine for home Continue abx and prednisone Recheck BP for hypokalemia Referral to hematology regarding her thrombocytopenia   Meds ordered this encounter  Medications  . ipratropium-albuterol (DUONEB) 0.5-2.5 (3) MG/3ML nebulizer solution 3 mL  . ipratropium-albuterol (DUONEB) 0.5-2.5 (3) MG/3ML SOLN    Sig: Take 3 mLs by nebulization every 6 (six) hours as needed.    Dispense:  360 mL    Refill:  2     Signed Lamar Blinks, MD  Received her BMP 5/21- looks ok.  Called 5/23 to check on her  Her oxygen today is 97% and she feels better Advised BMP is normal except for hyperglycemia but she is on steroids  Results for orders placed or performed in visit on 88/11/03  Basic metabolic panel  Result Value Ref Range   Sodium 137 135 - 145 mEq/L   Potassium 3.7 3.5 - 5.1 mEq/L   Chloride 94 (L) 96 - 112 mEq/L   CO2 32 19 - 32 mEq/L   Glucose, Bld 250 (H) 70 - 99 mg/dL   BUN 15 6 - 23 mg/dL   Creatinine, Ser 0.58 0.40 - 1.20 mg/dL   Calcium 9.4 8.4 - 10.5 mg/dL   GFR 107.55 >60.00 mL/min

## 2018-01-16 NOTE — Telephone Encounter (Signed)
Copied from [place] [id number]. Topic: Appointment Scheduling - Scheduling Inquiry for Clinic >> Jan 16, 2018 11:02 AM Hewitt Shorts wrote: Pt was seen in the er this weekend and diagnosed with double pneumonia and was given  5 breathing treatments and two antibiotics in the IV but did not stay she states she had to be home with her husband and they asked that she follow up with a pcp in a week.  She has been seeing dr Birdie Riddle but wants to see de copland like her husband but she only takes one new a day and this appt needs to be in a week  Best number [phone]

## 2018-01-16 NOTE — Telephone Encounter (Signed)
Patient was seen today in the office with Dr. Lorelei Pont.

## 2018-01-16 NOTE — Patient Instructions (Addendum)
Good to see you today- I am glad that you are a bit better at least!   Please purchase a nebulizer machine - you can get one online- so you can do duoneb treatments at home as needed We will check your potassium today and I will be in touch with your report  I will also set you up to see hematology to discuss your chronic low platelets.  Yours are not dangerously low but it would be nice to find out why you have this.  It may be due to your liver but I am not sure   Please come and see me or Dr. Darene Lamer if you are not back to normal in the next week or so- please let us know sooner if you are worse

## 2018-01-17 LAB — BASIC METABOLIC PANEL
BUN: 15 mg/dL (ref 6–23)
CO2: 32 mEq/L (ref 19–32)
Calcium: 9.4 mg/dL (ref 8.4–10.5)
Chloride: 94 mEq/L — ABNORMAL LOW (ref 96–112)
Creatinine, Ser: 0.58 mg/dL (ref 0.40–1.20)
GFR: 107.55 mL/min (ref >60.00)
Glucose, Bld: 250 mg/dL — ABNORMAL HIGH (ref 70–99)
Potassium: 3.7 mEq/L (ref 3.5–5.1)
Sodium: 137 mEq/L (ref 135–145)

## 2018-01-19 ENCOUNTER — Inpatient Hospital Stay: Payer: PPO

## 2018-01-19 ENCOUNTER — Encounter: Payer: Self-pay | Admitting: Hematology & Oncology

## 2018-01-19 ENCOUNTER — Other Ambulatory Visit: Payer: Self-pay

## 2018-01-19 ENCOUNTER — Inpatient Hospital Stay: Payer: PPO | Attending: Hematology & Oncology | Admitting: Hematology & Oncology

## 2018-01-19 VITALS — BP 161/76 | HR 94 | Temp 98.3°F | Resp 20 | Wt 211.0 lb

## 2018-01-19 DIAGNOSIS — D696 Thrombocytopenia, unspecified: Secondary | ICD-10-CM

## 2018-01-19 DIAGNOSIS — Z86010 Personal history of colonic polyps: Secondary | ICD-10-CM | POA: Diagnosis not present

## 2018-01-19 DIAGNOSIS — K7460 Unspecified cirrhosis of liver: Secondary | ICD-10-CM | POA: Insufficient documentation

## 2018-01-19 DIAGNOSIS — I10 Essential (primary) hypertension: Secondary | ICD-10-CM | POA: Diagnosis not present

## 2018-01-19 DIAGNOSIS — Z79899 Other long term (current) drug therapy: Secondary | ICD-10-CM | POA: Insufficient documentation

## 2018-01-19 DIAGNOSIS — Z800 Family history of malignant neoplasm of digestive organs: Secondary | ICD-10-CM | POA: Insufficient documentation

## 2018-01-19 DIAGNOSIS — K219 Gastro-esophageal reflux disease without esophagitis: Secondary | ICD-10-CM | POA: Diagnosis not present

## 2018-01-19 DIAGNOSIS — Z7984 Long term (current) use of oral hypoglycemic drugs: Secondary | ICD-10-CM | POA: Diagnosis not present

## 2018-01-19 DIAGNOSIS — K7581 Nonalcoholic steatohepatitis (NASH): Secondary | ICD-10-CM | POA: Diagnosis not present

## 2018-01-19 DIAGNOSIS — E669 Obesity, unspecified: Secondary | ICD-10-CM | POA: Diagnosis not present

## 2018-01-19 DIAGNOSIS — E1165 Type 2 diabetes mellitus with hyperglycemia: Secondary | ICD-10-CM | POA: Insufficient documentation

## 2018-01-19 DIAGNOSIS — Z803 Family history of malignant neoplasm of breast: Secondary | ICD-10-CM | POA: Diagnosis not present

## 2018-01-19 DIAGNOSIS — Z87891 Personal history of nicotine dependence: Secondary | ICD-10-CM | POA: Insufficient documentation

## 2018-01-19 DIAGNOSIS — E785 Hyperlipidemia, unspecified: Secondary | ICD-10-CM | POA: Diagnosis not present

## 2018-01-19 DIAGNOSIS — R0602 Shortness of breath: Secondary | ICD-10-CM | POA: Diagnosis not present

## 2018-01-19 DIAGNOSIS — F419 Anxiety disorder, unspecified: Secondary | ICD-10-CM | POA: Insufficient documentation

## 2018-01-19 DIAGNOSIS — M129 Arthropathy, unspecified: Secondary | ICD-10-CM | POA: Insufficient documentation

## 2018-01-19 LAB — COMPREHENSIVE METABOLIC PANEL
ALT: 43 U/L (ref 10–47)
AST: 50 U/L — ABNORMAL HIGH (ref 11–38)
Albumin: 3.7 g/dL (ref 3.5–5.0)
Alkaline Phosphatase: 63 U/L (ref 26–84)
Anion gap: 7 (ref 5–15)
BUN: 12 mg/dL (ref 7–22)
CO2: 33 mmol/L (ref 18–33)
Calcium: 9 mg/dL (ref 8.0–10.3)
Chloride: 96 mmol/L — ABNORMAL LOW (ref 98–108)
Creatinine, Ser: 0.7 mg/dL (ref 0.60–1.20)
Glucose, Bld: 240 mg/dL — ABNORMAL HIGH (ref 73–118)
Potassium: 3 mmol/L — CL (ref 3.3–4.7)
Sodium: 136 mmol/L (ref 128–145)
Total Bilirubin: 1.1 mg/dL (ref 0.2–1.6)
Total Protein: 7.4 g/dL (ref 6.4–8.1)

## 2018-01-19 LAB — CBC WITH DIFFERENTIAL (CANCER CENTER ONLY)
Basophils Absolute: 0.1 10*3/uL (ref 0.0–0.1)
Basophils Relative: 1 %
Eosinophils Absolute: 0.1 10*3/uL (ref 0.0–0.5)
Eosinophils Relative: 1 %
HCT: 34.6 % — ABNORMAL LOW (ref 34.8–46.6)
Hemoglobin: 11.9 g/dL (ref 11.6–15.9)
Lymphocytes Relative: 27 %
Lymphs Abs: 2.2 10*3/uL (ref 0.9–3.3)
MCH: 29 pg (ref 26.0–34.0)
MCHC: 34.4 g/dL (ref 32.0–36.0)
MCV: 84.2 fL (ref 81.0–101.0)
Monocytes Absolute: 0.5 10*3/uL (ref 0.1–0.9)
Monocytes Relative: 6 %
Neutro Abs: 5.2 10*3/uL (ref 1.5–6.5)
Neutrophils Relative %: 65 %
Platelet Count: 78 10*3/uL — ABNORMAL LOW (ref 145–400)
RBC: 4.11 MIL/uL (ref 3.70–5.32)
RDW: 15.1 % (ref 11.1–15.7)
WBC Count: 8.1 10*3/uL (ref 3.9–10.0)

## 2018-01-19 LAB — PLATELET BY CITRATE

## 2018-01-19 NOTE — Progress Notes (Signed)
Hematology and Oncology Follow Up Visit  Rachael Buck [id number] 1940/12/28 77 y.o. 01/19/2018   Principle Diagnosis:   Thrombocytopenia -- NASH/Splenomegaly ; Immune based thrombocytopenia  Current Therapy:    Observation     Interim History:  Rachael Buck is back for follow-up.  We saw her a year ago.  At that time, we did not feel that there was any intervention necessary for her thrombocytopenia.  She has NASH.  She has splenomegaly.  She had a CT scan 4 years ago which showed these.  I am sure that they are more prominent now.  She recently had pneumonia.  Both she and her husband had pneumonia.  She was found with a platelet count 62,000.  She was put on antibiotics and steroids.  She finished these recently.  Her platelet count today is 78,000.  As such, it is likely that there may be an immune component to her thrombocytopenia.  The problem is that she is going to need some type of vascular surgery for her left leg.  She will need to have multiple incisions in her left leg.  Because of this procedure, her platelet count probably needs to be on the higher side.  She is had no bleeding.  She is had no bruising.  She does have diabetes which is not well controlled.  She is had no cough or shortness of breath, aside that associated with the pneumonia.  She has had no change in medications outside of the antibiotics and prednisone.  She has had no weight loss or significant weight gain.  Overall, her performance status is ECOG 1.  Medications:  Current Outpatient Medications:  .  albuterol (PROAIR HFA) 108 (90 Base) MCG/ACT inhaler, Inhale 2 puffs into the lungs every 4 (four) hours as needed for wheezing or shortness of breath., Disp: 1 Inhaler, Rfl: 6 .  ALPRAZolam (XANAX) 0.5 MG tablet, TAKE 1 TABLET BY MOUTH THREE TIMES DAILY, Disp: 90 tablet, Rfl: 1 .  amoxicillin (AMOXIL) 500 MG capsule, Take 1 capsule (500 mg total) by mouth 3 (three) times daily., Disp: 21  capsule, Rfl: 0 .  aspirin (ASPIRIN CHILDRENS) 81 MG chewable tablet, Chew 1 tablet (81 mg total) by mouth 2 (two) times daily., Disp: 60 tablet, Rfl: 0 .  azithromycin (ZITHROMAX Z-PAK) 250 MG tablet, Take 1 tablet (250 mg total) by mouth daily. 2 pills (559m) day 1 then 1 pill (2530m days 2-5., Disp: 6 tablet, Rfl: 0 .  colestipol (COLESTID) 1 g tablet, Take 1 g by mouth daily., Disp: , Rfl:  .  dicyclomine (BENTYL) 20 MG tablet, TAKE 1 TABLET BY MOUTH THREE TIMES DAILY AS NEEDED, Disp: 360 tablet, Rfl: 0 .  diphenhydrAMINE (BENADRYL) 25 MG tablet, Take 25 mg by mouth every 6 (six) hours as needed (for allergies.)., Disp: , Rfl:  .  fenofibrate 160 MG tablet, Take 1 tablet (160 mg total) by mouth daily., Disp: 90 tablet, Rfl: 1 .  glipiZIDE (GLUCOTROL XL) 5 MG 24 hr tablet, TAKE 1 TABLET(5 MG) BY MOUTH DAILY WITH BREAKFAST, Disp: 90 tablet, Rfl: 0 .  hydrochlorothiazide (HYDRODIURIL) 25 MG tablet, TAKE 1 TABLET BY MOUTH EVERY DAY, Disp: 90 tablet, Rfl: 0 .  hydrocortisone 2.5 % cream, Apply topically., Disp: , Rfl:  .  ipratropium-albuterol (DUONEB) 0.5-2.5 (3) MG/3ML SOLN, Take 3 mLs by nebulization every 6 (six) hours as needed., Disp: 360 mL, Rfl: 2 .  loperamide (IMODIUM) 2 MG capsule, Take 4-6 mg by mouth daily as needed (for  IBS symptoms)., Disp: , Rfl:  .  losartan (COZAAR) 50 MG tablet, TAKE 1 TABLET BY MOUTH EVERY DAY, Disp: 90 tablet, Rfl: 0 .  metFORMIN (GLUCOPHAGE) 1000 MG tablet, TAKE 1 TABLET BY MOUTH TWICE DAILY WITH MEALS, Disp: 180 tablet, Rfl: 0 .  omeprazole (PRILOSEC) 40 MG capsule, TAKE 1 CAPSULE BY MOUTH EVERY DAY, Disp: 90 capsule, Rfl: 0 .  ONE TOUCH ULTRA TEST test strip, USE AS DIRECTED (Patient taking differently: TEST BLOOD SUGARS ONCE DAILY IN THE MORNING), Disp: 300 each, Rfl: 0 .  ONETOUCH DELICA LANCETS FINE MISC, 1 Stick by Does not apply route 2 (two) times daily. (Patient taking differently: 1 each by Other route daily. ), Disp: 100 each, Rfl: 3 .  predniSONE  (DELTASONE) 20 MG tablet, Take 2 tablets (40 mg total) by mouth daily., Disp: 8 tablet, Rfl: 0 .  sertraline (ZOLOFT) 50 MG tablet, TAKE 1 TABLET BY MOUTH EVERY DAY, Disp: 90 tablet, Rfl: 0 .  simvastatin (ZOCOR) 80 MG tablet, TAKE 1 TABLET BY MOUTH EVERY DAY, Disp: 90 tablet, Rfl: 0 .  sitaGLIPtin (JANUVIA) 100 MG tablet, Take 1 tablet (100 mg total) by mouth daily., Disp: 30 tablet, Rfl: 6 .  sulfamethoxazole-trimethoprim (BACTRIM DS,SEPTRA DS) 800-160 MG tablet, Take 1 tablet by mouth 2 (two) times daily., Disp: 14 tablet, Rfl: 0  Allergies:  Allergies  Allergen Reactions  . Codeine Nausea And Vomiting  . Shrimp [Shellfish Allergy] Swelling    Past Medical History, Surgical history, Social history, and Family History were reviewed and updated.  Review of Systems: Review of Systems  Constitutional: Negative.   HENT:  Negative.   Eyes: Negative.   Respiratory: Positive for cough and shortness of breath.   Cardiovascular: Negative.   Gastrointestinal: Negative.   Endocrine: Negative.   Genitourinary: Negative.    Musculoskeletal: Positive for myalgias.  Skin: Negative.   Neurological: Negative.   Hematological: Negative.   Psychiatric/Behavioral: Negative.     Physical Exam:  weight is 211 lb (95.7 kg). Her oral temperature is 98.3 F (36.8 C). Her blood pressure is 161/76 (abnormal) and her pulse is 94. Her respiration is 20 and oxygen saturation is 95%.   Wt Readings from Last 3 Encounters:  01/19/18 211 lb (95.7 kg)  01/16/18 211 lb (95.7 kg)  01/14/18 210 lb (95.3 kg)    Physical Exam  Constitutional: She is oriented to person, place, and time.  HENT:  Head: Normocephalic and atraumatic.  Mouth/Throat: Oropharynx is clear and moist.  Eyes: Pupils are equal, round, and reactive to light. EOM are normal.  Neck: Normal range of motion.  Cardiovascular: Normal rate, regular rhythm and normal heart sounds.  Pulmonary/Chest: Effort normal and breath sounds normal.    Abdominal: Soft. Bowel sounds are normal.  Musculoskeletal: Normal range of motion. She exhibits no edema, tenderness or deformity.  Lymphadenopathy:    She has no cervical adenopathy.  Neurological: She is alert and oriented to person, place, and time.  Skin: Skin is warm and dry. No rash noted. No erythema.  Psychiatric: She has a normal mood and affect. Her behavior is normal. Judgment and thought content normal.  Vitals reviewed.    Lab Results  Component Value Date   WBC 8.1 01/19/2018   HGB 11.9 01/19/2018   HCT 34.6 (L) 01/19/2018   MCV 84.2 01/19/2018   PLT 78 (L) 01/19/2018     Chemistry      Component Value Date/Time   NA 136 01/19/2018 1428   NA  127 (L) 02/14/2017 1335   K 3.0 (LL) 01/19/2018 1428   K 3.6 02/14/2017 1335   CL 96 (L) 01/19/2018 1428   CL 92 (L) 02/14/2017 1335   CO2 33 01/19/2018 1428   CO2 28 02/14/2017 1335   BUN 12 01/19/2018 1428   BUN 7 (L) 02/14/2017 1335   CREATININE 0.70 01/19/2018 1428   CREATININE 0.47 (L) 02/14/2017 1335   CREATININE 0.56 07/24/2014 1204      Component Value Date/Time   CALCIUM 9.0 01/19/2018 1428   CALCIUM 9.3 02/14/2017 1335   ALKPHOS 63 01/19/2018 1428   ALKPHOS 68 02/14/2017 1335   AST 50 (H) 01/19/2018 1428   AST 29 02/14/2017 1335   ALT 43 01/19/2018 1428   ALT 20 02/14/2017 1335   BILITOT 1.1 01/19/2018 1428   BILITOT 0.7 02/14/2017 1335         Impression and Plan: Ms. Capp is a 77 year old white female.  She has mild thrombocytopenia.  I did look at her blood smear.  Her platelets were well granulated.  She had several large platelets.  She had no nucleated red blood cells.  There were no immature myeloid cells.  She had no immature lymphoid cells.  She had no hypersegmented polys.  Again, I think that we have to get her platelet count a little bit higher because of this vascular surgery.  I think Nplate would be a good choice for her.  I think that given the fact that her platelet count  seem to go up with prednisone, Nplate would be worthwhile to use.  I would do not want to use steroids because of her diabetes.  I spent about 45 minutes with her.  It was nice talking to her again.  I know she is had a lot going on.  She seems to be a little bit worried about aplastic anemia.  She had a 40-year-old die of this disease back in the early 1970s.  I told her that I did not think this was a problem as her white cells and red cells were okay.  I think would be nice to get another ultrasound of her abdomen so that we can see how things look.  We will get started on the Nplate next Thursday.  I will give her 3 doses of this.  We will see what her platelet count is.  I think as long as her platelet count is above 90,000, she should be safe for surgery.  Over half the time spent with her was face-to-face.  I will see her back in 3 weeks.   [name], MD 5/23/20194:37 PM

## 2018-01-19 NOTE — Progress Notes (Signed)
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 She has NASH. Platelet count today is 80 and Hgb is stable at 11.4 with MCV of 84. INR is 1.2 and PT 12.2.  She denies any episodes of bleeding, bruising or petechiae.  She has had several surgeries in the past including bilateral rotator Buck repairs and total hysterectomy without any problems with bleeding.  She is scheduled to have a left knee replacement on Friday (6/22) with Dr. Netta Cedars.  No personal cancer history. Family cancer history includes mother with stomach cancer (died at 18 yo) and daughter with breast cancer.  She had a son that passed away at 33 years old from idiopathic aplastic anemia.  No fever, chills, n/v, cough, rash, dizziness, chest pain, palpitations, abdominal pain or changes in bowel or bladder habits.  She had her last colonoscopy in 2016. She had polyps removed and also had internal and external hemorrhoids.  Mammogram in April was negative.  No lymphadenopathy found on exam.  She has occasional SOB due to exacerbation of asthma.  She has maintained a good appetite and is staying well hydrated. Her weight is stable.  She has uncontrolled diabetes. Her last Hgb A1c was 8.7.  She had IBS which resolved once she stopped Metformin and switched to Januvia.  She has mild puffiness in her feet and ankles that comes and goes. This improves when she props her legs up.  She was a smoker at 1 ppd for 15 years. She quit 20 years ago.  No ETOH.   ROS: All other 10 point review of systems is negative.   PAST MEDICAL HISTORY:   Past Medical History:  Diagnosis Date  . Anxiety   . Arthritis   .  Asthma   . Cirrhosis, nonalcoholic ([place])   . Colon polyps   . Diabetes mellitus   . Diverticulitis   . GERD (gastroesophageal reflux disease)   . Hyperlipidemia   . Hypertension   . NASH (nonalcoholic steatohepatitis)   . Obesity   . Pneumonia     ALLERGIES: Allergies  Allergen Reactions  . Codeine Nausea And Vomiting  . Shrimp [Shellfish Allergy] Swelling      MEDICATIONS:  Current Outpatient Medications on File Prior to Visit  Medication Sig Dispense Refill  . albuterol (PROAIR HFA) 108 (90 Base) MCG/ACT inhaler Inhale 2 puffs into the lungs every 4 (four) hours as needed for wheezing or shortness of breath. 1 Inhaler 6  . ALPRAZolam (XANAX) 0.5 MG tablet TAKE 1 TABLET BY MOUTH THREE TIMES DAILY 90 tablet 1  . amoxicillin (AMOXIL) 500 MG capsule Take 1 capsule (500 mg total) by mouth 3 (three) times daily. 21 capsule 0  . aspirin (ASPIRIN CHILDRENS) 81 MG chewable tablet Chew 1 tablet (81 mg total) by mouth 2 (two) times daily. 60 tablet 0  . azithromycin (ZITHROMAX Z-PAK) 250 MG tablet Take 1 tablet (250 mg total) by mouth daily. 2 pills (5101m) day 1 then 1 pill (2554m days 2-5. 6 tablet 0  . colestipol (COLESTID) 1 g tablet Take 1 g by mouth daily.    . Marland Kitchenicyclomine (BENTYL) 20 MG tablet TAKE 1 TABLET BY  MOUTH THREE TIMES DAILY AS NEEDED 360 tablet 0  . diphenhydrAMINE (BENADRYL) 25 MG tablet Take 25 mg by mouth every 6 (six) hours as needed (for allergies.).    Marland Kitchen fenofibrate 160 MG tablet Take 1 tablet (160 mg total) by mouth daily. 90 tablet 1  . glipiZIDE (GLUCOTROL XL) 5 MG 24 hr tablet TAKE 1 TABLET(5 MG) BY MOUTH DAILY WITH BREAKFAST 90 tablet 0  . hydrochlorothiazide (HYDRODIURIL) 25 MG tablet TAKE 1 TABLET BY MOUTH EVERY DAY 90 tablet 0  . hydrocortisone 2.5 % cream Apply topically.    Marland Kitchen ipratropium-albuterol (DUONEB) 0.5-2.5 (3) MG/3ML SOLN Take 3 mLs by nebulization every 6 (six) hours as needed. 360 mL 2  . loperamide (IMODIUM) 2 MG capsule Take 4-6 mg by mouth  daily as needed (for IBS symptoms).    . losartan (COZAAR) 50 MG tablet TAKE 1 TABLET BY MOUTH EVERY DAY 90 tablet 0  . metFORMIN (GLUCOPHAGE) 1000 MG tablet TAKE 1 TABLET BY MOUTH TWICE DAILY WITH MEALS 180 tablet 0  . omeprazole (PRILOSEC) 40 MG capsule TAKE 1 CAPSULE BY MOUTH EVERY DAY 90 capsule 0  . ONE TOUCH ULTRA TEST test strip USE AS DIRECTED (Patient taking differently: TEST BLOOD SUGARS ONCE DAILY IN THE MORNING) 300 each 0  . ONETOUCH DELICA LANCETS FINE MISC 1 Stick by Does not apply route 2 (two) times daily. (Patient taking differently: 1 each by Other route daily. ) 100 each 3  . predniSONE (DELTASONE) 20 MG tablet Take 2 tablets (40 mg total) by mouth daily. 8 tablet 0  . sertraline (ZOLOFT) 50 MG tablet TAKE 1 TABLET BY MOUTH EVERY DAY 90 tablet 0  . simvastatin (ZOCOR) 80 MG tablet TAKE 1 TABLET BY MOUTH EVERY DAY 90 tablet 0  . sitaGLIPtin (JANUVIA) 100 MG tablet Take 1 tablet (100 mg total) by mouth daily. 30 tablet 6  . sulfamethoxazole-trimethoprim (BACTRIM DS,SEPTRA DS) 800-160 MG tablet Take 1 tablet by mouth 2 (two) times daily. 14 tablet 0   No current facility-administered medications on file prior to visit.      PAST SURGICAL HISTORY Past Surgical History:  Procedure Laterality Date  . CHOLECYSTECTOMY  1981  . OOPHORECTOMY  1973  . PARTIAL HYSTERECTOMY  1972  . ROTATOR Buck REPAIR     bilateral. 2006, 2008  . 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 TOTAL KNEE ARTHROPLASTY Left 02/18/2017   Procedure: LEFT TOTAL KNEE ARTHROPLASTY;  Surgeon: Netta Cedars, MD;  Location: [facility];  Service: Orthopedics;  Laterality: Left;  Marland Kitchen VEIN SURGERY      FAMILY HISTORY: Family History  Problem Relation Age of Onset  . Breast cancer Daughter   . Breast cancer Maternal Aunt   . Diabetes Brother   . Diabetes Sister   . Stomach cancer Mother   . Cancer Other   . GER disease Other   . Obesity Other     SOCIAL HISTORY:  reports that she has quit smoking. She has  never used smokeless tobacco. She reports that she does not drink alcohol or use drugs.  PERFORMANCE STATUS: The patient's performance status is 1 - Symptomatic but completely ambulatory  PHYSICAL EXAM: Most Recent Vital Signs: There were no vitals taken for this visit. There were no vitals taken for this visit.  General Appearance:    Alert, cooperative, no distress, appears stated age  Head:    Normocephalic, without obvious abnormality, atraumatic  Eyes:    PERRL, conjunctiva/corneas clear, EOM's intact, fundi  benign, both eyes        Throat:   Lips, mucosa, and tongue normal; teeth and gums normal  Neck:   Supple, symmetrical, trachea midline, no adenopathy;    thyroid:  no enlargement/tenderness/nodules; no carotid   bruit or JVD  Back:     Symmetric, no curvature, ROM normal, no CVA tenderness  Lungs:     Clear to auscultation bilaterally, respirations unlabored  Chest Wall:    No tenderness or deformity   Heart:    Regular rate and rhythm, S1 and S2 normal, no murmur, rub   or gallop     Abdomen:     Soft, non-tender, bowel sounds active all four quadrants,    no masses, no organomegaly        Extremities:   Extremities normal, atraumatic, no cyanosis or edema  Pulses:   2+ and symmetric all extremities  Skin:   Skin color, texture, turgor normal, no rashes or lesions  Lymph nodes:   Cervical, supraclavicular, and axillary nodes normal  Neurologic:   CNII-XII intact, normal strength, sensation and reflexes    throughout    LABORATORY DATA:  Results for orders placed or performed in visit on 01/19/18 (from the past 48 hour(s))  CBC w/Diff     Status: Abnormal   Collection Time: 01/19/18  2:28 PM  Result Value Ref Range   WBC Count 8.1 3.9 - 10.0 K/uL   RBC 4.11 3.70 - 5.32 MIL/uL   Hemoglobin 11.9 11.6 - 15.9 g/dL   HCT 34.6 (L) 34.8 - 46.6 %   MCV 84.2 81.0 - 101.0 fL   MCH 29.0 26.0 - 34.0 pg   MCHC 34.4 32.0 - 36.0 g/dL   RDW 15.1 11.1 - 15.7 %   Platelet  Count 78 (L) 145 - 400 K/uL   Neutrophils Relative % 65 %   Neutro Abs 5.2 1.5 - 6.5 K/uL   Lymphocytes Relative 27 %   Lymphs Abs 2.2 0.9 - 3.3 K/uL   Monocytes Relative 6 %   Monocytes Absolute 0.5 0.1 - 0.9 K/uL   Eosinophils Relative 1 %   Eosinophils Absolute 0.1 0.0 - 0.5 K/uL   Basophils Relative 1 %   Basophils Absolute 0.1 0.0 - 0.1 K/uL    Comment: Performed at [facility] at [facility], [address] [place], [place] [address]      RADIOGRAPHY: No results found.     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 Platelet count today is 80 and Hgb is stable at 11.4 with MCV of 84. INR is 1.2. She has no history of bleeding and has not noticed any bruising or petechiae. She has had surgery in the past without complication.  She is scheduled to have a left knee replacement with Dr. Veverly Fells later this week. From our standpoint she is ok to proceed with this procedure.  We will plan to see her back in 2 months for repeat lab work and follow-up.   All questions were answered. She will contact our office with any questions or concerns. We can certainly see her much sooner if necessary.  She was discussed with and also seen by Dr. Marin Olp and he is in agreement with the aforementioned.   [name]     Addendum:  I saw and examined the patient with Sarah. I looked at her blood  under the the microscope. She had decreased platelets. Platelets were well granulated. She had several large platelets.  Her platelet count is 80,000 today. I think this should be adequate for surgery. Her coags are normal.  I don't think that there should be a bleeding problem with her thrombocytopenia. If there is, then I think a platelet transfusion is what she would need.  I don't see that there is a underlying hematologic malignancy. I don't see anything that  suggest myelodysplasia.  I will like to see her back in a couple months. We will see how her platelet count looks. If there are any issues with surgery, we will be available to help out.  We spent about 40 minutes with her. We answered all of her questions.

## 2018-01-25 ENCOUNTER — Other Ambulatory Visit: Payer: Self-pay | Admitting: *Deleted

## 2018-01-25 DIAGNOSIS — I83892 Varicose veins of left lower extremities with other complications: Secondary | ICD-10-CM

## 2018-01-26 ENCOUNTER — Inpatient Hospital Stay: Payer: PPO

## 2018-01-26 ENCOUNTER — Ambulatory Visit (HOSPITAL_BASED_OUTPATIENT_CLINIC_OR_DEPARTMENT_OTHER)
Admission: RE | Admit: 2018-01-26 | Discharge: 2018-01-26 | Disposition: A | Discharge status: Home / Self Care | Payer: PPO | Source: Ambulatory Visit | Attending: Hematology & Oncology | Admitting: Hematology & Oncology

## 2018-01-26 VITALS — BP 126/75 | HR 87 | Temp 97.9°F | Resp 20

## 2018-01-26 DIAGNOSIS — K7581 Nonalcoholic steatohepatitis (NASH): Secondary | ICD-10-CM | POA: Diagnosis not present

## 2018-01-26 DIAGNOSIS — K7460 Unspecified cirrhosis of liver: Secondary | ICD-10-CM | POA: Insufficient documentation

## 2018-01-26 DIAGNOSIS — K7689 Other specified diseases of liver: Secondary | ICD-10-CM | POA: Diagnosis not present

## 2018-01-26 DIAGNOSIS — I700 Atherosclerosis of aorta: Secondary | ICD-10-CM | POA: Insufficient documentation

## 2018-01-26 DIAGNOSIS — D696 Thrombocytopenia, unspecified: Secondary | ICD-10-CM

## 2018-01-26 DIAGNOSIS — Z9049 Acquired absence of other specified parts of digestive tract: Secondary | ICD-10-CM | POA: Diagnosis not present

## 2018-01-26 DIAGNOSIS — R161 Splenomegaly, not elsewhere classified: Secondary | ICD-10-CM | POA: Diagnosis not present

## 2018-01-26 LAB — CBC WITH DIFFERENTIAL (CANCER CENTER ONLY)
Basophils Absolute: 0.1 10*3/uL (ref 0.0–0.1)
Basophils Relative: 1 %
Eosinophils Absolute: 0.1 10*3/uL (ref 0.0–0.5)
Eosinophils Relative: 1 %
HCT: 35.9 % (ref 34.8–46.6)
Hemoglobin: 12.1 g/dL (ref 11.6–15.9)
Lymphocytes Relative: 25 %
Lymphs Abs: 1.8 10*3/uL (ref 0.9–3.3)
MCH: 28.1 pg (ref 26.0–34.0)
MCHC: 33.7 g/dL (ref 32.0–36.0)
MCV: 83.3 fL (ref 81.0–101.0)
Monocytes Absolute: 0.6 10*3/uL (ref 0.1–0.9)
Monocytes Relative: 8 %
Neutro Abs: 4.6 10*3/uL (ref 1.5–6.5)
Neutrophils Relative %: 65 %
Platelet Count: 75 10*3/uL — ABNORMAL LOW (ref 145–400)
RBC: 4.31 MIL/uL (ref 3.70–5.32)
RDW: 15.1 % (ref 11.1–15.7)
WBC Count: 7.1 10*3/uL (ref 3.9–10.0)

## 2018-01-26 LAB — PLATELET BY CITRATE

## 2018-01-26 MED ORDER — ROMIPLOSTIM 250 MCG ~~LOC~~ SOLR
1.0000 ug/kg | Freq: Once | SUBCUTANEOUS | Status: AC
  Administered 2018-01-26: 95 ug via SUBCUTANEOUS
  Filled 2018-01-26: qty 0.19

## 2018-01-26 NOTE — Patient Instructions (Signed)
Romiplostim injection What is this medicine? ROMIPLOSTIM (roe mi PLOE stim) helps your body make more platelets. This medicine is used to treat low platelets caused by chronic idiopathic thrombocytopenic purpura (ITP). This medicine may be used for other purposes; ask your health care provider or pharmacist if you have questions. COMMON BRAND NAME(S): Nplate What should I tell my health care provider before I take this medicine? They need to know if you have any of these conditions: -cancer or myelodysplastic syndrome -low blood counts, like low white cell, platelet, or red cell counts -take medicines that treat or prevent blood clots -an unusual or allergic reaction to romiplostim, mannitol, other medicines, foods, dyes, or preservatives -pregnant or trying to get pregnant -breast-feeding How should I use this medicine? This medicine is for injection under the skin. It is given by a health care professional in a hospital or clinic setting. A special MedGuide will be given to you before your injection. Read this information carefully each time. Talk to your pediatrician regarding the use of this medicine in children. Special care may be needed. Overdosage: If you think you have taken too much of this medicine contact a poison control center or emergency room at once. NOTE: This medicine is only for you. Do not share this medicine with others. What if I miss a dose? It is important not to miss your dose. Call your doctor or health care professional if you are unable to keep an appointment. What may interact with this medicine? Interactions are not expected. This list may not describe all possible interactions. Give your health care provider a list of all the medicines, herbs, non-prescription drugs, or dietary supplements you use. Also tell them if you smoke, drink alcohol, or use illegal drugs. Some items may interact with your medicine. What should I watch for while using this  medicine? Your condition will be monitored carefully while you are receiving this medicine. Visit your prescriber or health care professional for regular checks on your progress and for the needed blood tests. It is important to keep all appointments. What side effects may I notice from receiving this medicine? Side effects that you should report to your doctor or health care professional as soon as possible: -allergic reactions like skin rash, itching or hives, swelling of the face, lips, or tongue -shortness of breath, chest pain, swelling in a leg -unusual bleeding or bruising Side effects that usually do not require medical attention (report to your doctor or health care professional if they continue or are bothersome): -dizziness -headache -muscle aches -pain in arms and legs -stomach pain -trouble sleeping This list may not describe all possible side effects. Call your doctor for medical advice about side effects. You may report side effects to FDA at 1-800-FDA-1088. Where should I keep my medicine? This drug is given in a hospital or clinic and will not be stored at home. NOTE: This sheet is a summary. It may not cover all possible information. If you have questions about this medicine, talk to your doctor, pharmacist, or health care provider.  2018 Elsevier/Gold Standard (2008-04-15 15:13:04)  

## 2018-01-30 DIAGNOSIS — A09 Infectious gastroenteritis and colitis, unspecified: Secondary | ICD-10-CM | POA: Diagnosis not present

## 2018-01-30 DIAGNOSIS — R932 Abnormal findings on diagnostic imaging of liver and biliary tract: Secondary | ICD-10-CM | POA: Diagnosis not present

## 2018-01-30 DIAGNOSIS — D131 Benign neoplasm of stomach: Secondary | ICD-10-CM | POA: Diagnosis not present

## 2018-02-02 ENCOUNTER — Inpatient Hospital Stay: Payer: PPO

## 2018-02-02 ENCOUNTER — Other Ambulatory Visit: Payer: Self-pay

## 2018-02-02 ENCOUNTER — Inpatient Hospital Stay: Payer: PPO | Attending: Hematology & Oncology

## 2018-02-02 ENCOUNTER — Other Ambulatory Visit: Payer: Self-pay | Admitting: Family

## 2018-02-02 VITALS — BP 128/66 | HR 87 | Temp 98.0°F | Resp 20 | Wt 201.1 lb

## 2018-02-02 DIAGNOSIS — R0602 Shortness of breath: Secondary | ICD-10-CM | POA: Insufficient documentation

## 2018-02-02 DIAGNOSIS — K7581 Nonalcoholic steatohepatitis (NASH): Secondary | ICD-10-CM | POA: Insufficient documentation

## 2018-02-02 DIAGNOSIS — Z79899 Other long term (current) drug therapy: Secondary | ICD-10-CM | POA: Insufficient documentation

## 2018-02-02 DIAGNOSIS — E119 Type 2 diabetes mellitus without complications: Secondary | ICD-10-CM | POA: Insufficient documentation

## 2018-02-02 DIAGNOSIS — R05 Cough: Secondary | ICD-10-CM | POA: Diagnosis not present

## 2018-02-02 DIAGNOSIS — M791 Myalgia, unspecified site: Secondary | ICD-10-CM | POA: Diagnosis not present

## 2018-02-02 DIAGNOSIS — D696 Thrombocytopenia, unspecified: Secondary | ICD-10-CM | POA: Insufficient documentation

## 2018-02-02 DIAGNOSIS — D500 Iron deficiency anemia secondary to blood loss (chronic): Secondary | ICD-10-CM

## 2018-02-02 DIAGNOSIS — R161 Splenomegaly, not elsewhere classified: Secondary | ICD-10-CM | POA: Insufficient documentation

## 2018-02-02 DIAGNOSIS — Z7982 Long term (current) use of aspirin: Secondary | ICD-10-CM | POA: Insufficient documentation

## 2018-02-02 LAB — CBC WITH DIFFERENTIAL (CANCER CENTER ONLY)
Basophils Absolute: 0.1 10*3/uL (ref 0.0–0.1)
Basophils Relative: 1 %
Eosinophils Absolute: 0 10*3/uL (ref 0.0–0.5)
Eosinophils Relative: 1 %
HCT: 32 % — ABNORMAL LOW (ref 34.8–46.6)
Hemoglobin: 10.7 g/dL — ABNORMAL LOW (ref 11.6–15.9)
Lymphocytes Relative: 24 %
Lymphs Abs: 1.1 10*3/uL (ref 0.9–3.3)
MCH: 27.9 pg (ref 26.0–34.0)
MCHC: 33.4 g/dL (ref 32.0–36.0)
MCV: 83.6 fL (ref 81.0–101.0)
Monocytes Absolute: 0.4 10*3/uL (ref 0.1–0.9)
Monocytes Relative: 9 %
Neutro Abs: 2.9 10*3/uL (ref 1.5–6.5)
Neutrophils Relative %: 65 %
Platelet Count: 72 10*3/uL — ABNORMAL LOW (ref 145–400)
RBC: 3.83 MIL/uL (ref 3.70–5.32)
RDW: 15.4 % (ref 11.1–15.7)
WBC Count: 4.5 10*3/uL (ref 3.9–10.0)

## 2018-02-02 LAB — IRON AND TIBC
Iron: 47 ug/dL (ref 41–142)
Saturation Ratios: 11 % — ABNORMAL LOW (ref 21–57)
TIBC: 438 ug/dL (ref 236–444)
UIBC: 391 ug/dL

## 2018-02-02 LAB — RETICULOCYTES
RBC.: 3.9 MIL/uL (ref 3.70–5.45)
Retic Count, Absolute: 89.7 10*3/uL (ref 33.7–90.7)
Retic Ct Pct: 2.3 % — ABNORMAL HIGH (ref 0.7–2.1)

## 2018-02-02 LAB — PLATELET BY CITRATE

## 2018-02-02 LAB — FERRITIN: Ferritin: 19 ng/mL (ref 9–269)

## 2018-02-02 MED ORDER — ROMIPLOSTIM 250 MCG ~~LOC~~ SOLR
1.0000 ug/kg | Freq: Once | SUBCUTANEOUS | Status: AC
  Administered 2018-02-02: 95 ug via SUBCUTANEOUS
  Filled 2018-02-02: qty 0.19

## 2018-02-02 NOTE — Progress Notes (Signed)
10:37 AM Rachael Buck notified of decreased HGB, tarry stools, dyspnea.  Pt states she has completed antibiotics for pneumonia.  Will draw iron studies, instructed to notify GI doctor for continued tarry stools, to refrain from naprosyn and ifuprophen. Verbalized understanding.

## 2018-02-02 NOTE — Patient Instructions (Signed)
Romiplostim injection What is this medicine? ROMIPLOSTIM (roe mi PLOE stim) helps your body make more platelets. This medicine is used to treat low platelets caused by chronic idiopathic thrombocytopenic purpura (ITP). This medicine may be used for other purposes; ask your health care provider or pharmacist if you have questions. COMMON BRAND NAME(S): Nplate What should I tell my health care provider before I take this medicine? They need to know if you have any of these conditions: -cancer or myelodysplastic syndrome -low blood counts, like low white cell, platelet, or red cell counts -take medicines that treat or prevent blood clots -an unusual or allergic reaction to romiplostim, mannitol, other medicines, foods, dyes, or preservatives -pregnant or trying to get pregnant -breast-feeding How should I use this medicine? This medicine is for injection under the skin. It is given by a health care professional in a hospital or clinic setting. A special MedGuide will be given to you before your injection. Read this information carefully each time. Talk to your pediatrician regarding the use of this medicine in children. Special care may be needed. Overdosage: If you think you have taken too much of this medicine contact a poison control center or emergency room at once. NOTE: This medicine is only for you. Do not share this medicine with others. What if I miss a dose? It is important not to miss your dose. Call your doctor or health care professional if you are unable to keep an appointment. What may interact with this medicine? Interactions are not expected. This list may not describe all possible interactions. Give your health care provider a list of all the medicines, herbs, non-prescription drugs, or dietary supplements you use. Also tell them if you smoke, drink alcohol, or use illegal drugs. Some items may interact with your medicine. What should I watch for while using this  medicine? Your condition will be monitored carefully while you are receiving this medicine. Visit your prescriber or health care professional for regular checks on your progress and for the needed blood tests. It is important to keep all appointments. What side effects may I notice from receiving this medicine? Side effects that you should report to your doctor or health care professional as soon as possible: -allergic reactions like skin rash, itching or hives, swelling of the face, lips, or tongue -shortness of breath, chest pain, swelling in a leg -unusual bleeding or bruising Side effects that usually do not require medical attention (report to your doctor or health care professional if they continue or are bothersome): -dizziness -headache -muscle aches -pain in arms and legs -stomach pain -trouble sleeping This list may not describe all possible side effects. Call your doctor for medical advice about side effects. You may report side effects to FDA at 1-800-FDA-1088. Where should I keep my medicine? This drug is given in a hospital or clinic and will not be stored at home. NOTE: This sheet is a summary. It may not cover all possible information. If you have questions about this medicine, talk to your doctor, pharmacist, or health care provider.  2018 Elsevier/Gold Standard (2008-04-15 15:13:04)  

## 2018-02-03 ENCOUNTER — Other Ambulatory Visit: Payer: Self-pay | Admitting: Family

## 2018-02-06 DIAGNOSIS — H25812 Combined forms of age-related cataract, left eye: Secondary | ICD-10-CM | POA: Diagnosis not present

## 2018-02-06 DIAGNOSIS — H2512 Age-related nuclear cataract, left eye: Secondary | ICD-10-CM | POA: Diagnosis not present

## 2018-02-06 DIAGNOSIS — H25012 Cortical age-related cataract, left eye: Secondary | ICD-10-CM | POA: Diagnosis not present

## 2018-02-09 ENCOUNTER — Inpatient Hospital Stay: Payer: PPO

## 2018-02-09 ENCOUNTER — Other Ambulatory Visit: Payer: Self-pay

## 2018-02-09 ENCOUNTER — Inpatient Hospital Stay (HOSPITAL_BASED_OUTPATIENT_CLINIC_OR_DEPARTMENT_OTHER): Payer: PPO | Admitting: Hematology & Oncology

## 2018-02-09 VITALS — BP 150/83 | HR 101 | Temp 98.2°F | Resp 19 | Wt 213.0 lb

## 2018-02-09 DIAGNOSIS — M791 Myalgia, unspecified site: Secondary | ICD-10-CM

## 2018-02-09 DIAGNOSIS — R0602 Shortness of breath: Secondary | ICD-10-CM

## 2018-02-09 DIAGNOSIS — R161 Splenomegaly, not elsewhere classified: Secondary | ICD-10-CM | POA: Diagnosis not present

## 2018-02-09 DIAGNOSIS — Z7982 Long term (current) use of aspirin: Secondary | ICD-10-CM

## 2018-02-09 DIAGNOSIS — R05 Cough: Secondary | ICD-10-CM

## 2018-02-09 DIAGNOSIS — E119 Type 2 diabetes mellitus without complications: Secondary | ICD-10-CM | POA: Diagnosis not present

## 2018-02-09 DIAGNOSIS — K7581 Nonalcoholic steatohepatitis (NASH): Secondary | ICD-10-CM | POA: Diagnosis not present

## 2018-02-09 DIAGNOSIS — Z79899 Other long term (current) drug therapy: Secondary | ICD-10-CM

## 2018-02-09 DIAGNOSIS — D696 Thrombocytopenia, unspecified: Secondary | ICD-10-CM | POA: Diagnosis not present

## 2018-02-09 LAB — CBC WITH DIFFERENTIAL (CANCER CENTER ONLY)
Basophils Absolute: 0.1 10*3/uL (ref 0.0–0.1)
Basophils Relative: 2 %
Eosinophils Absolute: 0 10*3/uL (ref 0.0–0.5)
Eosinophils Relative: 1 %
HCT: 33.7 % — ABNORMAL LOW (ref 34.8–46.6)
Hemoglobin: 11.2 g/dL — ABNORMAL LOW (ref 11.6–15.9)
Lymphocytes Relative: 26 %
Lymphs Abs: 1.4 10*3/uL (ref 0.9–3.3)
MCH: 27.8 pg (ref 26.0–34.0)
MCHC: 33.2 g/dL (ref 32.0–36.0)
MCV: 83.6 fL (ref 81.0–101.0)
Monocytes Absolute: 0.5 10*3/uL (ref 0.1–0.9)
Monocytes Relative: 9 %
Neutro Abs: 3.4 10*3/uL (ref 1.5–6.5)
Neutrophils Relative %: 62 %
Platelet Count: 113 10*3/uL — ABNORMAL LOW (ref 145–400)
RBC: 4.03 MIL/uL (ref 3.70–5.32)
RDW: 15.3 % (ref 11.1–15.7)
WBC Count: 5.5 10*3/uL (ref 3.9–10.0)

## 2018-02-09 LAB — PLATELET BY CITRATE

## 2018-02-09 MED ORDER — ROMIPLOSTIM 250 MCG ~~LOC~~ SOLR
1.0000 ug/kg | Freq: Once | SUBCUTANEOUS | Status: AC
  Administered 2018-02-09: 95 ug via SUBCUTANEOUS
  Filled 2018-02-09: qty 0.19

## 2018-02-09 NOTE — Patient Instructions (Signed)

## 2018-02-09 NOTE — Progress Notes (Signed)
Hematology and Oncology Follow Up Visit  Rachael Buck [id number] 05/02/1941 77 y.o. 02/09/2018   Principle Diagnosis:   Thrombocytopenia -- NASH/Splenomegaly ; Immune based thrombocytopenia  Current Therapy:    Nplate q week -- surgery on 02/21/2018     Interim History:  Rachael Buck is back for follow-up.  She is doing quite well.  She has responded nicely to the Nplate for her platelet recovery.  Her orthopedic surgery was moved back to June 25.  As such, we need to continue the Nplate until then.  We did have a ultrasound done on her abdomen.  She does have cirrhosis.  Her splenic volume is 1600 cm.  She has cirrhosis.  Unfortunate, this is the reason for her thrombocytopenia is the compensatory splenomegaly.  She has diabetes.  She is trying to watch her blood sugars.  She is had no leg swelling.  She has had no bruises.  She is had no fever.  Overall, her performance status is ECOG 1.  Medications:  Current Outpatient Medications:  .  ferrous sulfate 325 (65 FE) MG EC tablet, Take 325 mg by mouth daily., Disp: , Rfl:  .  albuterol (PROAIR HFA) 108 (90 Base) MCG/ACT inhaler, Inhale 2 puffs into the lungs every 4 (four) hours as needed for wheezing or shortness of breath., Disp: 1 Inhaler, Rfl: 6 .  ALPRAZolam (XANAX) 0.5 MG tablet, TAKE 1 TABLET BY MOUTH THREE TIMES DAILY, Disp: 90 tablet, Rfl: 1 .  aspirin (ASPIRIN CHILDRENS) 81 MG chewable tablet, Chew 1 tablet (81 mg total) by mouth 2 (two) times daily., Disp: 60 tablet, Rfl: 0 .  colestipol (COLESTID) 1 g tablet, Take 1 g by mouth daily., Disp: , Rfl:  .  dicyclomine (BENTYL) 20 MG tablet, TAKE 1 TABLET BY MOUTH THREE TIMES DAILY AS NEEDED, Disp: 360 tablet, Rfl: 0 .  diphenhydrAMINE (BENADRYL) 25 MG tablet, Take 25 mg by mouth every 6 (six) hours as needed (for allergies.)., Disp: , Rfl:  .  fenofibrate 160 MG tablet, Take 1 tablet (160 mg total) by mouth daily., Disp: 90 tablet, Rfl: 1 .  glipiZIDE (GLUCOTROL XL) 5  MG 24 hr tablet, TAKE 1 TABLET(5 MG) BY MOUTH DAILY WITH BREAKFAST, Disp: 90 tablet, Rfl: 0 .  hydrochlorothiazide (HYDRODIURIL) 25 MG tablet, TAKE 1 TABLET BY MOUTH EVERY DAY, Disp: 90 tablet, Rfl: 0 .  hydrocortisone 2.5 % cream, Apply topically., Disp: , Rfl:  .  ipratropium-albuterol (DUONEB) 0.5-2.5 (3) MG/3ML SOLN, Take 3 mLs by nebulization every 6 (six) hours as needed., Disp: 360 mL, Rfl: 2 .  loperamide (IMODIUM) 2 MG capsule, Take 4-6 mg by mouth daily as needed (for IBS symptoms)., Disp: , Rfl:  .  losartan (COZAAR) 50 MG tablet, TAKE 1 TABLET BY MOUTH EVERY DAY, Disp: 90 tablet, Rfl: 0 .  metFORMIN (GLUCOPHAGE) 1000 MG tablet, TAKE 1 TABLET BY MOUTH TWICE DAILY WITH MEALS (Patient taking differently: TAKE 1/2 TABLET BY MOUTH TWICE DAILY WITH MEALS 02/02/18), Disp: 180 tablet, Rfl: 0 .  omeprazole (PRILOSEC) 40 MG capsule, TAKE 1 CAPSULE BY MOUTH EVERY DAY, Disp: 90 capsule, Rfl: 0 .  ONE TOUCH ULTRA TEST test strip, USE AS DIRECTED (Patient taking differently: TEST BLOOD SUGARS ONCE DAILY IN THE MORNING), Disp: 300 each, Rfl: 0 .  ONETOUCH DELICA LANCETS FINE MISC, 1 Stick by Does not apply route 2 (two) times daily. (Patient taking differently: 1 each by Other route daily. ), Disp: 100 each, Rfl: 3 .  sertraline (ZOLOFT) 50  MG tablet, TAKE 1 TABLET BY MOUTH EVERY DAY, Disp: 90 tablet, Rfl: 0 .  simvastatin (ZOCOR) 80 MG tablet, TAKE 1 TABLET BY MOUTH EVERY DAY, Disp: 90 tablet, Rfl: 0 .  sitaGLIPtin (JANUVIA) 100 MG tablet, Take 1 tablet (100 mg total) by mouth daily., Disp: 30 tablet, Rfl: 6  Allergies:  Allergies  Allergen Reactions  . Codeine Nausea And Vomiting  . Shrimp [Shellfish Allergy] Swelling    Past Medical History, Surgical history, Social history, and Family History were reviewed and updated.  Review of Systems: Review of Systems  Constitutional: Negative.   HENT:  Negative.   Eyes: Negative.   Respiratory: Positive for cough and shortness of breath.     Cardiovascular: Negative.   Gastrointestinal: Negative.   Endocrine: Negative.   Genitourinary: Negative.    Musculoskeletal: Positive for myalgias.  Skin: Negative.   Neurological: Negative.   Hematological: Negative.   Psychiatric/Behavioral: Negative.     Physical Exam:  weight is 213 lb (96.6 kg). Her oral temperature is 98.2 F (36.8 C). Her blood pressure is 150/83 (abnormal) and her pulse is 101 (abnormal). Her respiration is 19 and oxygen saturation is 93%.   Wt Readings from Last 3 Encounters:  02/09/18 213 lb (96.6 kg)  02/02/18 201 lb 1.9 oz (91.2 kg)  01/19/18 211 lb (95.7 kg)    Physical Exam  Constitutional: She is oriented to person, place, and time.  HENT:  Head: Normocephalic and atraumatic.  Mouth/Throat: Oropharynx is clear and moist.  Eyes: Pupils are equal, round, and reactive to light. EOM are normal.  Neck: Normal range of motion.  Cardiovascular: Normal rate, regular rhythm and normal heart sounds.  Pulmonary/Chest: Effort normal and breath sounds normal.  Abdominal: Soft. Bowel sounds are normal.  Musculoskeletal: Normal range of motion. She exhibits no edema, tenderness or deformity.  Lymphadenopathy:    She has no cervical adenopathy.  Neurological: She is alert and oriented to person, place, and time.  Skin: Skin is warm and dry. No rash noted. No erythema.  Psychiatric: She has a normal mood and affect. Her behavior is normal. Judgment and thought content normal.  Vitals reviewed.    Lab Results  Component Value Date   WBC 5.5 02/09/2018   HGB 11.2 (L) 02/09/2018   HCT 33.7 (L) 02/09/2018   MCV 83.6 02/09/2018   PLT 113 (L) 02/09/2018     Chemistry      Component Value Date/Time   NA 136 01/19/2018 1428   NA 127 (L) 02/14/2017 1335   K 3.0 (LL) 01/19/2018 1428   K 3.6 02/14/2017 1335   CL 96 (L) 01/19/2018 1428   CL 92 (L) 02/14/2017 1335   CO2 33 01/19/2018 1428   CO2 28 02/14/2017 1335   BUN 12 01/19/2018 1428   BUN 7 (L)  02/14/2017 1335   CREATININE 0.70 01/19/2018 1428   CREATININE 0.47 (L) 02/14/2017 1335   CREATININE 0.56 07/24/2014 1204      Component Value Date/Time   CALCIUM 9.0 01/19/2018 1428   CALCIUM 9.3 02/14/2017 1335   ALKPHOS 63 01/19/2018 1428   ALKPHOS 68 02/14/2017 1335   AST 50 (H) 01/19/2018 1428   AST 29 02/14/2017 1335   ALT 43 01/19/2018 1428   ALT 20 02/14/2017 1335   BILITOT 1.1 01/19/2018 1428   BILITOT 0.7 02/14/2017 1335         Impression and Plan: Ms. Morioka is a 77 year old white female.  She has mild thrombocytopenia secondary to splenomegaly  from her cirrhosis.  We will go ahead with her Nplate today.  I will then give her Nplate next Friday.  At that point, she should be doing well and her platelet count should be adequate for surgery.  I would like to get her back in 3 months.  I think we need a ultrasound of her spleen when we see her back so we can see if there is any change in her splenomegaly.   [name], MD 6/13/201910:55 AM

## 2018-02-10 ENCOUNTER — Other Ambulatory Visit: Payer: Self-pay | Admitting: Family Medicine

## 2018-02-10 NOTE — Telephone Encounter (Signed)
Requesting:Alprazolam Contract:2014, needs updated csc UDS:2014, needs updated uds Last Visit:01/16/18 Next Visit:none with pcp Last Refill:11/30/17 1-refill  Please Advise

## 2018-02-13 ENCOUNTER — Other Ambulatory Visit: Payer: PPO | Admitting: Vascular Surgery

## 2018-02-13 ENCOUNTER — Encounter: Payer: Self-pay | Admitting: Family Medicine

## 2018-02-13 ENCOUNTER — Ambulatory Visit (INDEPENDENT_AMBULATORY_CARE_PROVIDER_SITE_OTHER): Payer: PPO | Admitting: Family Medicine

## 2018-02-13 VITALS — BP 122/70 | HR 94 | Temp 98.1°F | Resp 16 | Ht 64.5 in | Wt 215.4 lb

## 2018-02-13 DIAGNOSIS — K7460 Unspecified cirrhosis of liver: Secondary | ICD-10-CM | POA: Diagnosis not present

## 2018-02-13 DIAGNOSIS — R062 Wheezing: Secondary | ICD-10-CM

## 2018-02-13 DIAGNOSIS — E119 Type 2 diabetes mellitus without complications: Secondary | ICD-10-CM

## 2018-02-13 LAB — BASIC METABOLIC PANEL
BUN: 9 mg/dL (ref 6–23)
CO2: 33 mEq/L — ABNORMAL HIGH (ref 19–32)
Calcium: 9.4 mg/dL (ref 8.4–10.5)
Chloride: 94 mEq/L — ABNORMAL LOW (ref 96–112)
Creatinine, Ser: 0.56 mg/dL (ref 0.40–1.20)
GFR: 111.97 mL/min (ref >60.00)
Glucose, Bld: 199 mg/dL — ABNORMAL HIGH (ref 70–99)
Potassium: 3.9 mEq/L (ref 3.5–5.1)
Sodium: 137 mEq/L (ref 135–145)

## 2018-02-13 LAB — HEMOGLOBIN A1C: Hgb A1c MFr Bld: 8.6 % — ABNORMAL HIGH (ref 4.6–6.5)

## 2018-02-13 MED ORDER — MOMETASONE FUROATE 100 MCG/ACT IN AERO: 2.0000 | INHALATION_SPRAY | Freq: Two times a day (BID) | RESPIRATORY_TRACT | 12 refills | Status: DC

## 2018-02-13 NOTE — Patient Instructions (Addendum)
I will contact Dr. Marin Olp to ask about your metformin- will see if you need to stop this medication We will check your A1c today For your persistent cough, let's have you add Asmanex inhaler; 2 puffs twice a day REGARDLESS of current symptoms. Try this for a month or so and see if it helps you

## 2018-02-13 NOTE — Progress Notes (Addendum)
[facility] [address], [place], [place] [address] [phone]  Date:  02/13/2018   Name:  Rachael Buck   DOB:  05-15-1941   MRN:  [id number]  PCP:  [name], MD    Chief Complaint: No chief complaint on file.   History of Present Illness:  Rachael Buck is a 77 y.o. very pleasant female patient who presents with the following:  Following up today- I last saw her in February for illness/ CAP She did get a neb machine for home use and has been using this prn but not daily Since our last visit she notes that she is tired and continues to cough although not as bad She will occasionally wheeze She does feel SOB and will wheeze with walking any distance  No chest pain or tightness   The cough is dry Her CXR last month was read as negative   Dr. Marin Olp is seeing her for her thrombocytopenia is treating her with Nplate which has increased her platelet count  ? Does she need to stop her metformin   She is having vein surgery on her left leg next week per Dr. Kellie Simmering   Lab Results  Component Value Date   HGBA1C 7.4 (H) 09/20/2017   She is on 5 mg of glucotrol Metformin 500 BID januvia Simvastatin  She did smoke in the past, quit 17 years ago  Dr. Watt Climes is her GI doctor    Patient Active Problem List   Diagnosis Date Noted  . 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 Thrombocytopenia ([place]) 02/15/2017  . Left knee pain 11/24/2015  . Varicose veins of left lower extremity with complications 67/61/9509  . Age-related cognitive decline 06/02/2015  . Acute pharyngitis 10/16/2014  . Fatigue 07/19/2014  . Orthostatic hypotension 05/14/2013  . Bronchitis with asthma, acute 01/29/2013  . Sinusitis 01/29/2013  . Routine general medical examination at a health care facility 12/20/2012  . Abdominal distention 10/23/2012  . Nail abnormality 09/04/2012  . Need for hepatitis B vaccination  08/10/2012  . Bleeding disorder ([place]) 05/08/2012  . Varices, esophageal ([place]) 03/21/2012  . NASH (nonalcoholic steatohepatitis) 03/21/2012  . HTN (hypertension) 12/21/2011  . Abdominal pain 05/31/2011  . Depression 03/28/2011  . IBS (irritable bowel syndrome) 03/23/2011  . INTRINSIC ASTHMA, UNSPECIFIED 08/27/2010  . DYSPNEA ON EXERTION 07/27/2010  . Uncontrolled diabetes mellitus ([place]) 07/24/2010  . MIXED HYPERLIPIDEMIA 07/24/2010  . OBESITY, UNSPECIFIED 07/24/2010  . ALLERGIC RHINITIS CAUSE UNSPECIFIED 07/24/2010  . REFLUX ESOPHAGITIS 07/24/2010  . PAIN IN JOINT PELVIC REGION AND THIGH 07/24/2010    Past Medical History:  Diagnosis Date  . Anxiety   . Arthritis   . Asthma   . Cirrhosis, nonalcoholic ([place])   . Colon polyps   . Diabetes mellitus   . Diverticulitis   . GERD (gastroesophageal reflux disease)   . Hyperlipidemia   . Hypertension   . NASH (nonalcoholic steatohepatitis)   . Obesity   . Pneumonia     Past Surgical History:  Procedure Laterality Date  . CHOLECYSTECTOMY  1981  . OOPHORECTOMY  1973  . PARTIAL HYSTERECTOMY  1972  . ROTATOR CUFF REPAIR     bilateral. 2006, 2008  . 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 TOTAL KNEE ARTHROPLASTY Left 02/18/2017   Procedure: LEFT TOTAL KNEE ARTHROPLASTY;  Surgeon: Netta Cedars, MD;  Location: [facility];  Service: Orthopedics;  Laterality: Left;  [facility] VEIN  SURGERY      Social History   Tobacco Use  . Smoking status: Former Research scientist (life sciences)  . Smokeless tobacco: Never Used  Substance Use Topics  . Alcohol use: No  . Drug use: No    Family History  Problem Relation Age of Onset  . Breast cancer Daughter   . Breast cancer Maternal Aunt   . Diabetes Brother   . Diabetes Sister   . Stomach cancer Mother   . Cancer Other   . GER disease Other   . Obesity Other     Allergies  Allergen Reactions  . Codeine Nausea And Vomiting  . Shrimp [Shellfish Allergy] Swelling    Medication list has been reviewed and  updated.  Current Outpatient Medications on File Prior to Visit  Medication Sig Dispense Refill  . albuterol (PROAIR HFA) 108 (90 Base) MCG/ACT inhaler Inhale 2 puffs into the lungs every 4 (four) hours as needed for wheezing or shortness of breath. 1 Inhaler 6  . ALPRAZolam (XANAX) 0.5 MG tablet TAKE 1 TABLET BY MOUTH THREE TIMES DAILY 90 tablet 2  . aspirin (ASPIRIN CHILDRENS) 81 MG chewable tablet Chew 1 tablet (81 mg total) by mouth 2 (two) times daily. 60 tablet 0  . colestipol (COLESTID) 1 g tablet Take 1 g by mouth daily.    Marland Kitchen dicyclomine (BENTYL) 20 MG tablet TAKE 1 TABLET BY MOUTH THREE TIMES DAILY AS NEEDED 360 tablet 0  . diphenhydrAMINE (BENADRYL) 25 MG tablet Take 25 mg by mouth every 6 (six) hours as needed (for allergies.).    Marland Kitchen fenofibrate 160 MG tablet Take 1 tablet (160 mg total) by mouth daily. 90 tablet 1  . ferrous sulfate 325 (65 FE) MG EC tablet Take 325 mg by mouth daily.    Marland Kitchen glipiZIDE (GLUCOTROL XL) 5 MG 24 hr tablet TAKE 1 TABLET(5 MG) BY MOUTH DAILY WITH BREAKFAST 90 tablet 0  . hydrochlorothiazide (HYDRODIURIL) 25 MG tablet TAKE 1 TABLET BY MOUTH EVERY DAY 90 tablet 0  . hydrocortisone 2.5 % cream Apply topically.    Marland Kitchen ipratropium-albuterol (DUONEB) 0.5-2.5 (3) MG/3ML SOLN Take 3 mLs by nebulization every 6 (six) hours as needed. 360 mL 2  . loperamide (IMODIUM) 2 MG capsule Take 4-6 mg by mouth daily as needed (for IBS symptoms).    . losartan (COZAAR) 50 MG tablet TAKE 1 TABLET BY MOUTH EVERY DAY 90 tablet 0  . metFORMIN (GLUCOPHAGE) 1000 MG tablet TAKE 1 TABLET BY MOUTH TWICE DAILY WITH MEALS (Patient taking differently: TAKE 1/2 TABLET BY MOUTH TWICE DAILY WITH MEALS 02/02/18) 180 tablet 0  . omeprazole (PRILOSEC) 40 MG capsule TAKE 1 CAPSULE BY MOUTH EVERY DAY 90 capsule 0  . ONE TOUCH ULTRA TEST test strip USE AS DIRECTED (Patient taking differently: TEST BLOOD SUGARS ONCE DAILY IN THE MORNING) 300 each 0  . ONETOUCH DELICA LANCETS FINE MISC 1 Stick by Does not  apply route 2 (two) times daily. (Patient taking differently: 1 each by Other route daily. ) 100 each 3  . sertraline (ZOLOFT) 50 MG tablet TAKE 1 TABLET BY MOUTH EVERY DAY 90 tablet 0  . simvastatin (ZOCOR) 80 MG tablet TAKE 1 TABLET BY MOUTH EVERY DAY 90 tablet 0  . sitaGLIPtin (JANUVIA) 100 MG tablet Take 1 tablet (100 mg total) by mouth daily. 30 tablet 6   No current facility-administered medications on file prior to visit.     Review of Systems:  As per HPI- otherwise negative.   Physical Examination: Vitals:  02/13/18 1128  BP: 122/70  Pulse: 94  Resp: 16  Temp: 98.1 F (36.7 C)  SpO2: 96%   Vitals:   02/13/18 1128  Weight: 215 lb 6.4 oz (97.7 kg)  Height: 5' 4.5" (1.638 m)   Body mass index is 36.4 kg/m. Ideal Body Weight: Weight in (lb) to have BMI = 25: 147.6  GEN: WDWN, NAD, Non-toxic, A & O x 3, obese, looks well otherwise HEENT: Atraumatic, Normocephalic. Neck supple. No masses, No LAD.  Bilateral TM wnl, oropharynx normal.  PEERL,EOMI.   Ears and Nose: No external deformity. CV: RRR, No M/G/R. No JVD. No thrill. No extra heart sounds. PULM: CTA B, no wheezes, crackles, rhonchi. No retractions. No resp. distress. No accessory muscle use. ABD: S, NT, ND, +BS. No rebound. No HSM. EXTR: No c/c/e NEURO Normal gait.  PSYCH: Normally interactive. Conversant. Not depressed or anxious appearing.  Calm demeanor.    Assessment and Plan: Wheezing - Plan: Mometasone Furoate (ASMANEX HFA) 100 MCG/ACT AERO  Controlled type 2 diabetes mellitus without complication, without long-term current use of insulin (HCC) - Plan: Basic metabolic panel, Hemoglobin A1c  She is having persistent cough and wheezing after recent respiratory infection Hesitate to use oral steroids with her other health issues, but will start her on asmanex I am not clear on if she needs to stop her metformin- will touch base with Dr. Marin Olp and check her A1c today Will plan further follow- up  pending labs.   Signed Lamar Blinks, MD 6/20 Received her labs although I have not yet heard back from Dr. Marin Olp- however upon speaking with patient I found out that Dr. Watt Climes with [facility] is her GI doc and is managing her liver- this makes a lot more sense!  Call into Dr. Perley Jain office asking him to call me back so I can ask him about her meds and her ?cirrhosis Will start on invokana once a day for her uncontrolled DM.  I am not sure if she will have to stop metformin   Meds ordered this encounter  Medications  . Mometasone Furoate (ASMANEX HFA) 100 MCG/ACT AERO    Sig: Inhale 2 puffs into the lungs 2 (two) times daily.    Dispense:  1 Inhaler    Refill:  12  . canagliflozin (INVOKANA) 100 MG TABS tablet    Sig: Take 1 tablet (100 mg total) by mouth daily before breakfast.    Dispense:  30 tablet    Refill:  6     Results for orders placed or performed in visit on 85/88/50  Basic metabolic panel  Result Value Ref [place]   Sodium 137 135 - 145 mEq/L   Potassium 3.9 3.5 - 5.1 mEq/L   Chloride 94 (L) 96 - 112 mEq/L   CO2 33 (H) 19 - 32 mEq/L   Glucose, Bld 199 (H) 70 - 99 mg/dL   BUN 9 6 - 23 mg/dL   Creatinine, Ser 0.56 0.40 - 1.20 mg/dL   Calcium 9.4 8.4 - 10.5 mg/dL   GFR 111.97 >60.00 mL/min  Hemoglobin A1c  Result Value Ref [place]   Hgb A1c MFr Bld 8.6 (H) 4.6 - 6.5 %    Called and discussed with Dr. 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  Let's visit in 3 months to check on her A1c.  Will send letter with lab details

## 2018-02-16 MED ORDER — CANAGLIFLOZIN 100 MG PO TABS: 100.0000 mg | ORAL_TABLET | Freq: Every day | ORAL | 6 refills | Status: DC

## 2018-02-16 NOTE — Addendum Note (Signed)
Addended by: Lamar Blinks C on: 02/16/2018 02:59 PM   Modules accepted: Orders

## 2018-02-17 ENCOUNTER — Inpatient Hospital Stay: Payer: PPO

## 2018-02-17 VITALS — BP 118/62 | HR 89 | Temp 98.5°F | Resp 20

## 2018-02-17 DIAGNOSIS — K7460 Unspecified cirrhosis of liver: Secondary | ICD-10-CM | POA: Insufficient documentation

## 2018-02-17 DIAGNOSIS — D696 Thrombocytopenia, unspecified: Secondary | ICD-10-CM

## 2018-02-17 DIAGNOSIS — K7581 Nonalcoholic steatohepatitis (NASH): Secondary | ICD-10-CM

## 2018-02-17 LAB — CBC WITH DIFFERENTIAL (CANCER CENTER ONLY)
Basophils Absolute: 0.1 10*3/uL (ref 0.0–0.1)
Basophils Relative: 2 %
Eosinophils Absolute: 0.1 10*3/uL (ref 0.0–0.5)
Eosinophils Relative: 1 %
HCT: 34.9 % (ref 34.8–46.6)
Hemoglobin: 11.5 g/dL — ABNORMAL LOW (ref 11.6–15.9)
Lymphocytes Relative: 23 %
Lymphs Abs: 1.4 10*3/uL (ref 0.9–3.3)
MCH: 27.5 pg (ref 26.0–34.0)
MCHC: 33 g/dL (ref 32.0–36.0)
MCV: 83.5 fL (ref 81.0–101.0)
Monocytes Absolute: 0.5 10*3/uL (ref 0.1–0.9)
Monocytes Relative: 9 %
Neutro Abs: 4 10*3/uL (ref 1.5–6.5)
Neutrophils Relative %: 65 %
Platelet Count: 139 10*3/uL — ABNORMAL LOW (ref 145–400)
RBC: 4.18 MIL/uL (ref 3.70–5.32)
RDW: 15 % (ref 11.1–15.7)
WBC Count: 6.1 10*3/uL (ref 3.9–10.0)

## 2018-02-17 LAB — PLATELET BY CITRATE

## 2018-02-17 MED ORDER — ROMIPLOSTIM 250 MCG ~~LOC~~ SOLR
1.0000 ug/kg | Freq: Once | SUBCUTANEOUS | Status: AC
  Administered 2018-02-17: 100 ug via SUBCUTANEOUS
  Filled 2018-02-17: qty 0.2

## 2018-02-20 ENCOUNTER — Ambulatory Visit: Payer: PPO | Admitting: Vascular Surgery

## 2018-02-20 ENCOUNTER — Encounter (HOSPITAL_COMMUNITY): Payer: PPO

## 2018-02-21 ENCOUNTER — Encounter: Payer: Self-pay | Admitting: Vascular Surgery

## 2018-02-21 ENCOUNTER — Ambulatory Visit: Payer: PPO | Admitting: Vascular Surgery

## 2018-02-21 VITALS — BP 106/66 | HR 87 | Temp 97.3°F | Resp 16 | Ht 65.0 in | Wt 215.0 lb

## 2018-02-21 DIAGNOSIS — I83892 Varicose veins of left lower extremities with other complications: Secondary | ICD-10-CM

## 2018-02-21 DIAGNOSIS — I868 Varicose veins of other specified sites: Secondary | ICD-10-CM

## 2018-02-21 HISTORY — PX: ENDOVENOUS ABLATION SAPHENOUS VEIN W/ LASER: SUR449

## 2018-02-21 NOTE — Progress Notes (Signed)
  Subjective:     Patient ID: Rachael Buck, female   DOB: 1940/12/09, 77 y.o.   MRN: [id number]  HPI This 77 year old female had laser ablation left great saphenous vein from the proximal calf to near the saphenofemoral junction performed under local tumescent anesthesia. A total of 2772 J of energy was utilized. She tolerated the procedure well.  Review of Systems     Objective:   Physical Exam BP 106/66 (BP Location: Left Arm, Patient Position: Sitting, Cuff Size: Large)   Pulse 87   Temp (!) 97.3 F (36.3 C) (Oral)   Resp 16   Ht 5\' 5"  (1.651 m)   Wt 215 lb (97.5 kg)   SpO2 95%   BMI 35.78 kg/m        Assessment:     Well-tolerated laser ablation left great saphenous vein for gross reflux with painful varicosities performed under local tumescent anesthesia return in 1 week to see Dr. Oneida Alar for follow-up duplex exam to confirm closure left great saphenous vein. Patient will then return in 3 months for evaluation for possible stab phlebectomy of residual painful varicosities    Plan:

## 2018-02-21 NOTE — Progress Notes (Signed)
Laser Ablation Procedure    Date: 02/21/2018   Rachael Buck DOB:Sep 23, 1940  Consent signed: Yes    Surgeon:  Dr. Nelda Severe. Kellie Simmering  Procedure: Laser Ablation: left Greater Saphenous Vein  BP 106/66 (BP Location: Left Arm, Patient Position: Sitting, Cuff Size: Large)   Pulse 87   Temp (!) 97.3 F (36.3 C) (Oral)   Resp 16   Ht 5\' 5"  (1.651 m)   Wt 215 lb (97.5 kg)   SpO2 95%   BMI 35.78 kg/m   Tumescent Anesthesia: 425 cc 0.9% NaCl with 50 cc Lidocaine HCL 1 % and 15 cc 8.4% NaHCO3  Local Anesthesia: 5 cc Lidocaine HCL and NaHCO3 (ratio 2:1)  Pulsed Mode: 15 watts, 52ms delay, 1.0 duration  Total Energy:  2772 Joules            Total Pulses:  187              Total Time: 3:05    Patient tolerated procedure well    Description of Procedure:  After marking the course of the secondary varicosities, the patient was placed on the operating table in the supine position, and the left leg was prepped and draped in sterile fashion.   Local anesthetic was administered and under ultrasound guidance the saphenous vein was accessed with a micro needle and guide wire; then the mirco puncture sheath was placed.  A guide wire was inserted saphenofemoral junction , followed by a 5 french sheath.  The position of the sheath and then the laser fiber below the junction was confirmed using the ultrasound.  Tumescent anesthesia was administered along the course of the saphenous vein using ultrasound guidance. The patient was placed in Trendelenburg position and protective laser glasses were placed on patient and staff, and the laser was fired at 15 watts continuous mode advancing 1-24mm/second for a total of 2772 joules.     Steri strip was applied to the IV insertion site and ABD pads and thigh high compression stockings were applied.  Ace wrap bandages were applied over the left thigh and calf and at the top of the saphenofemoral junction. Blood loss was less than 15 cc.  The patient ambulated  out of the operating room having tolerated the procedure well.

## 2018-02-22 ENCOUNTER — Encounter: Payer: Self-pay | Admitting: Vascular Surgery

## 2018-02-28 ENCOUNTER — Encounter: Payer: Self-pay | Admitting: Vascular Surgery

## 2018-02-28 ENCOUNTER — Ambulatory Visit (HOSPITAL_COMMUNITY)
Admission: RE | Admit: 2018-02-28 | Discharge: 2018-02-28 | Disposition: A | Discharge status: Home / Self Care | Payer: PPO | Source: Ambulatory Visit | Attending: Vascular Surgery | Admitting: Vascular Surgery

## 2018-02-28 ENCOUNTER — Ambulatory Visit (INDEPENDENT_AMBULATORY_CARE_PROVIDER_SITE_OTHER): Payer: PPO | Admitting: Vascular Surgery

## 2018-02-28 VITALS — BP 124/68 | HR 86 | Temp 99.3°F | Resp 18 | Ht 65.0 in | Wt 215.0 lb

## 2018-02-28 DIAGNOSIS — I83812 Varicose veins of left lower extremities with pain: Secondary | ICD-10-CM

## 2018-02-28 DIAGNOSIS — I868 Varicose veins of other specified sites: Secondary | ICD-10-CM

## 2018-02-28 DIAGNOSIS — I83892 Varicose veins of left lower extremities with other complications: Secondary | ICD-10-CM | POA: Diagnosis not present

## 2018-02-28 NOTE — Progress Notes (Signed)
Patient is a 77 year old female who returns for follow-up today after ablation of her left greater saphenous vein 02/21/2018.  He reports no postoperative pain.  She has some mild swelling.  She continues to wear her compression stockings.  She denies any shortness of breath or chest pain.  Physical exam:  Vitals:   02/28/18 0848  BP: 124/68  Pulse: 86  Resp: 18  Temp: 99.3 F (37.4 C)  TempSrc: Oral  SpO2: 95%  Weight: 215 lb (97.5 kg)  Height: 5' 5"  (1.651 m)    Some mild ecchymosis over the left medial thigh palpable cord in the saphenous, cluster of varicosities in the left medial calf ranging from 4 to 6 mm diameter 2+ dorsalis pedis pulse  Data: Patient had a duplex ultrasound today which showed successful closure of the left greater saphenous vein.  Assessment: Doing well status post laser ablation left greater saphenous vein.  She still has some persistent varicosities in the left medial calf.  We will give this about 3 months to see if these continue to resolve and become less symptomatic.  Plan: Follow-up 3 months  Rachael Hinds, MD [facility] Office: [phone] Pager: [phone]

## 2018-03-15 ENCOUNTER — Telehealth: Payer: Self-pay | Admitting: Vascular Surgery

## 2018-03-15 NOTE — Telephone Encounter (Signed)
sch appt spk to pt 06/07/18 345pm VV f/u MD

## 2018-03-27 ENCOUNTER — Other Ambulatory Visit: Payer: Self-pay | Admitting: Family Medicine

## 2018-04-03 ENCOUNTER — Ambulatory Visit: Payer: Self-pay | Admitting: *Deleted

## 2018-04-03 NOTE — Telephone Encounter (Signed)
Patient noticing her oxygen level was at 89-90% on Saturday. She began doing neb treatments on Saturday and O2 went up to 92% on RA.Today it is 92% RA. She does not feel short of breath but feels she can't get a deep breath in at time.  She notices her entire abdomen feeling more spongy and more hard than her normal. No CP/Bleeding reported. No difficulty voiding. No skin tint or sclera tint noted from patient. Describes it as an Ache, not really pain. 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 No availability with PCP and she refuses other provider appointment for today. Appointment made for 04/05/18 with PCP. Reviewed s/sx that would require emergency treatment immediately.

## 2018-04-03 NOTE — Telephone Encounter (Signed)
Called pt and lMOM. I was concerned about her sx.  We have an appt for Wednesday.  However if she is not doing ok in the meantime please go to the ER asap

## 2018-04-04 ENCOUNTER — Emergency Department (HOSPITAL_BASED_OUTPATIENT_CLINIC_OR_DEPARTMENT_OTHER)
Admission: EM | Admit: 2018-04-04 | Discharge: 2018-04-04 | Disposition: A | Discharge status: Home / Self Care | Payer: PPO | Attending: Emergency Medicine | Admitting: Emergency Medicine

## 2018-04-04 ENCOUNTER — Encounter (HOSPITAL_BASED_OUTPATIENT_CLINIC_OR_DEPARTMENT_OTHER): Payer: Self-pay

## 2018-04-04 ENCOUNTER — Other Ambulatory Visit: Payer: Self-pay

## 2018-04-04 ENCOUNTER — Emergency Department (HOSPITAL_BASED_OUTPATIENT_CLINIC_OR_DEPARTMENT_OTHER): Payer: PPO

## 2018-04-04 DIAGNOSIS — R109 Unspecified abdominal pain: Secondary | ICD-10-CM

## 2018-04-04 DIAGNOSIS — Z96652 Presence of left artificial knee joint: Secondary | ICD-10-CM | POA: Diagnosis not present

## 2018-04-04 DIAGNOSIS — Z87891 Personal history of nicotine dependence: Secondary | ICD-10-CM | POA: Insufficient documentation

## 2018-04-04 DIAGNOSIS — Z79899 Other long term (current) drug therapy: Secondary | ICD-10-CM | POA: Diagnosis not present

## 2018-04-04 DIAGNOSIS — D696 Thrombocytopenia, unspecified: Secondary | ICD-10-CM | POA: Insufficient documentation

## 2018-04-04 DIAGNOSIS — I10 Essential (primary) hypertension: Secondary | ICD-10-CM | POA: Insufficient documentation

## 2018-04-04 DIAGNOSIS — R140 Abdominal distension (gaseous): Secondary | ICD-10-CM | POA: Insufficient documentation

## 2018-04-04 DIAGNOSIS — J45909 Unspecified asthma, uncomplicated: Secondary | ICD-10-CM | POA: Insufficient documentation

## 2018-04-04 DIAGNOSIS — E876 Hypokalemia: Secondary | ICD-10-CM | POA: Insufficient documentation

## 2018-04-04 DIAGNOSIS — K7460 Unspecified cirrhosis of liver: Secondary | ICD-10-CM | POA: Diagnosis not present

## 2018-04-04 DIAGNOSIS — R0602 Shortness of breath: Secondary | ICD-10-CM | POA: Diagnosis not present

## 2018-04-04 DIAGNOSIS — R1084 Generalized abdominal pain: Secondary | ICD-10-CM | POA: Diagnosis not present

## 2018-04-04 LAB — URINALYSIS, ROUTINE W REFLEX MICROSCOPIC
Bilirubin Urine: NEGATIVE
Glucose, UA: >=500 mg/dL — AB
Hgb urine dipstick: NEGATIVE
Ketones, ur: NEGATIVE mg/dL
Nitrite: NEGATIVE
Protein, ur: NEGATIVE mg/dL
Specific Gravity, Urine: <1.005 — ABNORMAL LOW (ref 1.005–1.030)
pH: 7 (ref 5.0–8.0)

## 2018-04-04 LAB — URINALYSIS, MICROSCOPIC (REFLEX)

## 2018-04-04 LAB — COMPREHENSIVE METABOLIC PANEL
ALT: 22 U/L (ref 0–44)
AST: 38 U/L (ref 15–41)
Albumin: 4 g/dL (ref 3.5–5.0)
Alkaline Phosphatase: 52 U/L (ref 38–126)
Anion gap: 11 (ref 5–15)
BUN: 13 mg/dL (ref 8–23)
CO2: 30 mmol/L (ref 22–32)
Calcium: 9.1 mg/dL (ref 8.9–10.3)
Chloride: 96 mmol/L — ABNORMAL LOW (ref 98–111)
Creatinine, Ser: 0.68 mg/dL (ref 0.44–1.00)
GFR calc Af Amer: >60 mL/min (ref >60)
GFR calc non Af Amer: >60 mL/min (ref >60)
Glucose, Bld: 248 mg/dL — ABNORMAL HIGH (ref 70–99)
Potassium: 2.9 mmol/L — ABNORMAL LOW (ref 3.5–5.1)
Sodium: 137 mmol/L (ref 135–145)
Total Bilirubin: 0.6 mg/dL (ref 0.3–1.2)
Total Protein: 7.4 g/dL (ref 6.5–8.1)

## 2018-04-04 LAB — LIPASE, BLOOD: Lipase: 37 U/L (ref 11–51)

## 2018-04-04 LAB — CBC WITH DIFFERENTIAL/PLATELET
Basophils Absolute: 0 10*3/uL (ref 0.0–0.1)
Basophils Relative: 1 %
Eosinophils Absolute: 0.1 10*3/uL (ref 0.0–0.7)
Eosinophils Relative: 1 %
HCT: 33.2 % — ABNORMAL LOW (ref 36.0–46.0)
Hemoglobin: 11 g/dL — ABNORMAL LOW (ref 12.0–15.0)
Lymphocytes Relative: 24 %
Lymphs Abs: 1.4 10*3/uL (ref 0.7–4.0)
MCH: 27.1 pg (ref 26.0–34.0)
MCHC: 33.1 g/dL (ref 30.0–36.0)
MCV: 81.8 fL (ref 78.0–100.0)
Monocytes Absolute: 0.4 10*3/uL (ref 0.1–1.0)
Monocytes Relative: 8 %
Neutro Abs: 3.8 10*3/uL (ref 1.7–7.7)
Neutrophils Relative %: 66 %
Platelets: 68 10*3/uL — ABNORMAL LOW (ref 150–400)
RBC: 4.06 MIL/uL (ref 3.87–5.11)
RDW: 15.9 % — ABNORMAL HIGH (ref 11.5–15.5)
WBC: 5.7 10*3/uL (ref 4.0–10.5)

## 2018-04-04 MED ORDER — POLYETHYLENE GLYCOL 3350 17 GM/SCOOP PO POWD: 17.0000 g | Freq: Every day | ORAL | 0 refills | Status: DC

## 2018-04-04 MED ORDER — POTASSIUM CHLORIDE CRYS ER 20 MEQ PO TBCR
40.0000 meq | EXTENDED_RELEASE_TABLET | Freq: Once | ORAL | Status: AC
  Administered 2018-04-04: 40 meq via ORAL
  Filled 2018-04-04: qty 2

## 2018-04-04 MED ORDER — IOPAMIDOL (ISOVUE-300) INJECTION 61%
100.0000 mL | Freq: Once | INTRAVENOUS | Status: AC | PRN
  Administered 2018-04-04: 100 mL via INTRAVENOUS

## 2018-04-04 NOTE — Progress Notes (Addendum)
[facility] [place], [place], [place] [address] [phone]  Date:  04/05/2018   Name:  Rachael Buck   DOB:  1941-08-12   MRN:  [id number]  PCP:  [name], MD    Chief Complaint: Abdominal Swelling (went to ER, non alcoholic psorisis of the liver, swollen spleen) and Shortness of Breath   History of Present Illness:  Rachael Buck is a 77 y.o. very pleasant female patient who presents with the following:  Sick visit today- she was seen in the ER yesterday as follows: Patient with history of nonalcoholic steatohepatitis, GERD, diverticulitis, previous cholecystectomy--presents the emergency department with mild generalized abdominal tenderness with abdominal fullness and distention over the past 5 days.  Patient feels like there is pressure pushing up on her chest from her abdomen.  She denies any chest pain.  No fevers or cough.  She continues to have normal bowel movements with out blood.  No urinary symptoms.  States that she spoke with her GI office today and and was asked to go to the emergency department because of no available appointments.  No treatments prior to arrival.  No vomiting. The onset of this condition was acute. The course is constant. Aggravating factors: none. Alleviating factors: none/////////////////////////////////  Patient with abdominal pain, known NASH. Vitals are stable, no fever. Labs reassuring, chronic thrombocytopenia, mild hypokalemia noted today treated with oral potassium. Imaging with no demonstratable ascites, no other acute findings.  Patient does have a significant amount of stool in her large bowel. No signs of dehydration, patient is tolerating PO's. Lungs are clear and no signs suggestive of PNA. Low concern for appendicitis, cholecystitis, pancreatitis, ruptured viscus, UTI, kidney stone, aortic dissection, aortic aneurysm or other emergent abdominal etiology.  Supportive therapy indicated with return if symptoms worsen.   We also had a nurse triage note from her from 8/5-  Patient noticing her oxygen level was at 89-90% on Saturday. She began doing neb treatments on Saturday and O2 went up to 92% on RA.Today it is 92% RA. She does not feel short of breath but feels she can't get a deep breath in at time.  She notices her entire abdomen feeling more spongy and more hard than her normal. No CP/Bleeding reported. No difficulty voiding. No skin tint or sclera tint noted from patient. Describes it as an Ache, not really pain. 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 No availability with PCP and she refuses other provider appointment for today. Appointment made for 04/05/18 with PCP. Reviewed s/sx that would require emergency treatment immediately.   Pt notes that her belly is a bit better today- she was not aware of being constipated  She did have a BM today and her belly is less distended She does not have any acute pain  In the past she was on metformin and was having diarrhea- she stopped using this a few months ago however.  It is not 100% clear but it seems that she was advised to stop the metformin by Dr. Watt Climes just due to diarrhea and not any other safety concern, etc.  I will request his records but we may be able to start her back on a lower dose of metformin for glucose control   CT from yesterday IMPRESSION: 1. Enlarged cirrhotic liver without worrisome mass. 2. Post cholecystectomy. Prominence of biliary system felt to be related to post cholecystectomy state without obstructing stone or mass noted. 3.  Splenomegaly. 4. Upper abdominal varices. Prominent collateral vessels throughout the abdomen and pelvis with slight haziness of fat planes but without primary bowel inflammatory process noted. 5.  Aortic Atherosclerosis (ICD10-I70.0).  Her platelets were low again- she has not used the Nplate since June, she is going to back to see oncology in  September for follow-up.  We think her thrombocytopenia is due to splenomegaly secondary to her liver disease  She notes that she is feeling tired all the time   Dr. Watt Climes is her GI doctor- she will see him within a week or so for follow-up of her liver issues  She uses duoneb up to twice a day if needed - she will monitor her sx and also her oxygen sat and use if needed. Did not need yet today She was given potassium in the ER yesterday.  She also reports being given an rx for potassium to continue to take, but I cannot see this rx for some reason and am not sure of the strength she received    Xanax prn Bentyl prn Glipizide 5 daily, invokana 100 mg, januvia 100 hctz 25 Losartan 50 Omeprazole zoloft Zocor, fenofibrate colestid  Asmanex, duoneb Lab Results  Component Value Date   HGBA1C 8.6 (H) 02/13/2018     Patient Active Problem List   Diagnosis Date Noted  . Cirrhosis of liver ([place]) 02/17/2018  . 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 Thrombocytopenia ([place]) 02/15/2017  . Varicose veins of left lower extremity with complications 47/82/9562  . Age-related cognitive decline 06/02/2015  . Fatigue 07/19/2014  . Orthostatic hypotension 05/14/2013  . Bronchitis with asthma, acute 01/29/2013  . Nail abnormality 09/04/2012  . Bleeding disorder ([place]) 05/08/2012  . Varices, esophageal ([place]) 03/21/2012  . NASH (nonalcoholic steatohepatitis) 03/21/2012  . HTN (hypertension) 12/21/2011  . Depression 03/28/2011  . IBS (irritable bowel syndrome) 03/23/2011  . INTRINSIC ASTHMA, UNSPECIFIED 08/27/2010  . DYSPNEA ON EXERTION 07/27/2010  . Uncontrolled diabetes mellitus ([place]) 07/24/2010  . MIXED HYPERLIPIDEMIA 07/24/2010  . OBESITY, UNSPECIFIED 07/24/2010  . ALLERGIC RHINITIS CAUSE UNSPECIFIED 07/24/2010  . REFLUX ESOPHAGITIS 07/24/2010  . PAIN IN JOINT PELVIC REGION AND THIGH 07/24/2010    Past Medical History:  Diagnosis Date  . Anxiety   . Arthritis   . Asthma   .  Cirrhosis, nonalcoholic ([place])   . Colon polyps   . Diabetes mellitus   . Diverticulitis   . GERD (gastroesophageal reflux disease)   . Hyperlipidemia   . Hypertension   . NASH (nonalcoholic steatohepatitis)   . Obesity   . Pneumonia     Past Surgical History:  Procedure Laterality Date  . CHOLECYSTECTOMY  1981  . ENDOVENOUS ABLATION SAPHENOUS VEIN W/ LASER Left 02/21/2018   endovenous laser ablation left greater saphenous vein by Tinnie Gens MD   . Echo  . PARTIAL HYSTERECTOMY  1972  . ROTATOR CUFF REPAIR     bilateral. 2006, 2008  . 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 TOTAL KNEE ARTHROPLASTY Left 02/18/2017   Procedure: LEFT TOTAL KNEE ARTHROPLASTY;  Surgeon: Netta Cedars, MD;  Location: [facility];  Service: Orthopedics;  Laterality: Left;  Marland Kitchen VEIN SURGERY      Social History   Tobacco Use  . Smoking status: Former Research scientist (life sciences)  . Smokeless tobacco: Never Used  Substance Use Topics  . Alcohol use: No  . Drug use: No    Family History  Problem Relation Age of Onset  . Breast cancer Daughter   .  Breast cancer Maternal Aunt   . Diabetes Brother   . Diabetes Sister   . Stomach cancer Mother   . Cancer Other   . GER disease Other   . Obesity Other     Allergies  Allergen Reactions  . Codeine Nausea And Vomiting  . Shrimp [Shellfish Allergy] Swelling    Medication list has been reviewed and updated.  Current Outpatient Medications on File Prior to Visit  Medication Sig Dispense Refill  . albuterol (PROAIR HFA) 108 (90 Base) MCG/ACT inhaler Inhale 2 puffs into the lungs every 4 (four) hours as needed for wheezing or shortness of breath. 1 Inhaler 6  . ALPRAZolam (XANAX) 0.5 MG tablet TAKE 1 TABLET BY MOUTH THREE TIMES DAILY 90 tablet 2  . aspirin (ASPIRIN CHILDRENS) 81 MG chewable tablet Chew 1 tablet (81 mg total) by mouth 2 (two) times daily. 60 tablet 0  . canagliflozin (INVOKANA) 100 MG TABS tablet Take 1 tablet (100 mg total) by mouth daily  before breakfast. 30 tablet 6  . colestipol (COLESTID) 1 g tablet Take 1 g by mouth daily.    Marland Kitchen dicyclomine (BENTYL) 20 MG tablet TAKE 1 TABLET BY MOUTH THREE TIMES DAILY AS NEEDED 360 tablet 0  . diphenhydrAMINE (BENADRYL) 25 MG tablet Take 25 mg by mouth every 6 (six) hours as needed (for allergies.).    Marland Kitchen fenofibrate 160 MG tablet Take 1 tablet (160 mg total) by mouth daily. 90 tablet 1  . ferrous sulfate 325 (65 FE) MG EC tablet Take 325 mg by mouth daily.    Marland Kitchen glipiZIDE (GLUCOTROL XL) 5 MG 24 hr tablet TAKE 1 TABLET(5 MG) BY MOUTH DAILY WITH BREAKFAST 90 tablet 0  . hydrochlorothiazide (HYDRODIURIL) 25 MG tablet TAKE 1 TABLET BY MOUTH EVERY DAY 90 tablet 1  . hydrocortisone 2.5 % cream Apply topically.    Marland Kitchen ipratropium-albuterol (DUONEB) 0.5-2.5 (3) MG/3ML SOLN Take 3 mLs by nebulization every 6 (six) hours as needed. 360 mL 2  . loperamide (IMODIUM) 2 MG capsule Take 4-6 mg by mouth daily as needed (for IBS symptoms).    . losartan (COZAAR) 50 MG tablet TAKE 1 TABLET BY MOUTH EVERY DAY 90 tablet 3  . metFORMIN (GLUCOPHAGE) 1000 MG tablet TAKE 1 TABLET BY MOUTH TWICE DAILY WITH MEALS 180 tablet 0  . Mometasone Furoate (ASMANEX HFA) 100 MCG/ACT AERO Inhale 2 puffs into the lungs 2 (two) times daily. 1 Inhaler 12  . omeprazole (PRILOSEC) 40 MG capsule TAKE 1 CAPSULE BY MOUTH EVERY DAY 90 capsule 0  . ONE TOUCH ULTRA TEST test strip USE AS DIRECTED (Patient taking differently: TEST BLOOD SUGARS ONCE DAILY IN THE MORNING) 300 each 0  . ONETOUCH DELICA LANCETS FINE MISC 1 Stick by Does not apply route 2 (two) times daily. (Patient taking differently: 1 each by Other route daily. ) 100 each 3  . polyethylene glycol powder (GLYCOLAX/MIRALAX) powder Take 17 g by mouth daily. 255 g 0  . sertraline (ZOLOFT) 50 MG tablet TAKE 1 TABLET BY MOUTH EVERY DAY 90 tablet 0  . simvastatin (ZOCOR) 80 MG tablet TAKE 1 TABLET BY MOUTH EVERY DAY 90 tablet 0  . sitaGLIPtin (JANUVIA) 100 MG tablet Take 1 tablet (100  mg total) by mouth daily. 30 tablet 6   No current facility-administered medications on file prior to visit.     Review of Systems:  As per HPI- otherwise negative. No fever or chills Feels at her baseline today No acute pain Feels tired  Physical Examination: Vitals:   04/05/18 1500  BP: 128/74  Pulse: 87  Resp: 20  Temp: 98.1 F (36.7 C)  SpO2: 95%   Vitals:   04/05/18 1500  Weight: 206 lb 3.2 oz (93.5 kg)  Height: 5\' 5"  (1.651 m)   Body mass index is 34.31 kg/m. Ideal Body Weight: Weight in (lb) to have BMI = 25: 149.9  GEN: WDWN, NAD, Non-toxic, A & O x 3, obese, otherwise looks reasonably well  HEENT: Atraumatic, Normocephalic. Neck supple. No masses, No LAD.  Bilateral TM wnl, oropharynx normal.  PEERL,EOMI.   Ears and Nose: No external deformity. CV: RRR, No M/G/R. No JVD. No thrill. No extra heart sounds. PULM: CTA B, no wheezes, crackles, rhonchi. No retractions. No resp. distress. No accessory muscle use. ABD: S, NT, ND, +BS. No rebound.. Hepatomegaly is apparent on exam EXTR: No c/c/e NEURO Normal gait.  PSYCH: Normally interactive. Conversant. Not depressed or anxious appearing.  Calm demeanor.    Assessment and Plan: Controlled type 2 diabetes mellitus without complication, without long-term current use of insulin ([place]) - Plan: Hemoglobin A1c  Cirrhosis of liver without ascites, unspecified hepatic cirrhosis type ([place]) - Plan: Basic metabolic panel  Thrombocytopenia ([place]) - Plan: CBC  Hypokalemia  Essential hypertension  Following up from ER today She suffers from NASAH/cirrhosis and splenomegaly, thrombocytopenia Feeling better since she has had a BM Check K today Check A1c- if trending up may add back a lower dose of metformin - perhaps 250 bid She is seeing Dr. Watt Climes soon and I have requested his recent office visit notes Thrombocytopenia is managed by Dr. Marin Olp with Nplate- she is low again but not dangerously so.  Will repeat a CBC in  about 2 weeks as a lab visit to follow Will plan further follow- up pending labs. Asked her to seek care again if she is not feeling ok   Signed Lamar Blinks, MD  Received her labs 8/9 A1c was 8.6% in June  Called her- she will start back on 500 mg of metformin daily, continue unless she has significant GI side effects Plan to recheck in the office in 4 months  Results for orders placed or performed in visit on 92/01/00  Basic metabolic panel  Result Value Ref Range   Sodium 141 135 - 145 mEq/L   Potassium 4.1 3.5 - 5.1 mEq/L   Chloride 97 96 - 112 mEq/L   CO2 35 (H) 19 - 32 mEq/L   Glucose, Bld 200 (H) 70 - 99 mg/dL   BUN 12 6 - 23 mg/dL   Creatinine, Ser 0.68 0.40 - 1.20 mg/dL   Calcium 10.0 8.4 - 10.5 mg/dL   GFR 89.46 >60.00 mL/min  Hemoglobin A1c  Result Value Ref Range   Hgb A1c MFr Bld 8.4 (H) 4.6 - 6.5 %   AG = 9, normal

## 2018-04-04 NOTE — ED Triage Notes (Signed)
Pt c/o abd distention x 5 days-was advised by GI to come to ED-NAD-steady gait

## 2018-04-04 NOTE — ED Provider Notes (Signed)
[facility] EMERGENCY DEPARTMENT Provider Note   CSN: [id number] Arrival date & time: 04/04/18  1140     History   Chief Complaint Chief Complaint  Patient presents with  . Bloated    HPI Rachael Buck is a 77 y.o. female.  Patient with history of nonalcoholic steatohepatitis, GERD, diverticulitis, previous cholecystectomy--presents the emergency department with mild generalized abdominal tenderness with abdominal fullness and distention over the past 5 days.  Patient feels like there is pressure pushing up on her chest from her abdomen.  She denies any chest pain.  No fevers or cough.  She continues to have normal bowel movements with out blood.  No urinary symptoms.  States that she spoke with her GI office today and and was asked to go to the emergency department because of no available appointments.  No treatments prior to arrival.  No vomiting. The onset of this condition was acute. The course is constant. Aggravating factors: none. Alleviating factors: none.       Past Medical History:  Diagnosis Date  . Anxiety   . Arthritis   . Asthma   . Cirrhosis, nonalcoholic ([place])   . Colon polyps   . Diabetes mellitus   . Diverticulitis   . GERD (gastroesophageal reflux disease)   . Hyperlipidemia   . Hypertension   . NASH (nonalcoholic steatohepatitis)   . Obesity   . Pneumonia     Patient Active Problem List   Diagnosis Date Noted  . Cirrhosis of liver ([place]) 02/17/2018  . 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 Thrombocytopenia ([place]) 02/15/2017  . Varicose veins of left lower extremity with complications 41/66/0630  . Age-related cognitive decline 06/02/2015  . Fatigue 07/19/2014  . Orthostatic hypotension 05/14/2013  . Bronchitis with asthma, acute 01/29/2013  . Nail abnormality 09/04/2012  . Bleeding disorder ([place]) 05/08/2012  . Varices, esophageal ([place]) 03/21/2012  . NASH (nonalcoholic steatohepatitis) 03/21/2012  . HTN (hypertension)  12/21/2011  . Depression 03/28/2011  . IBS (irritable bowel syndrome) 03/23/2011  . INTRINSIC ASTHMA, UNSPECIFIED 08/27/2010  . DYSPNEA ON EXERTION 07/27/2010  . Uncontrolled diabetes mellitus ([place]) 07/24/2010  . MIXED HYPERLIPIDEMIA 07/24/2010  . OBESITY, UNSPECIFIED 07/24/2010  . ALLERGIC RHINITIS CAUSE UNSPECIFIED 07/24/2010  . REFLUX ESOPHAGITIS 07/24/2010  . PAIN IN JOINT PELVIC REGION AND THIGH 07/24/2010    Past Surgical History:  Procedure Laterality Date  . CHOLECYSTECTOMY  1981  . ENDOVENOUS ABLATION SAPHENOUS VEIN W/ LASER Left 02/21/2018   endovenous laser ablation left greater saphenous vein by Tinnie Gens MD   . [place]  . PARTIAL HYSTERECTOMY  1972  . ROTATOR CUFF REPAIR     bilateral. 2006, 2008  . 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 TOTAL KNEE ARTHROPLASTY Left 02/18/2017   Procedure: LEFT TOTAL KNEE ARTHROPLASTY;  Surgeon: Netta Cedars, MD;  Location: [facility];  Service: Orthopedics;  Laterality: Left;  Marland Kitchen VEIN SURGERY       OB History   None      Home Medications    Prior to Admission medications   Medication Sig Start Date End Date Taking? Authorizing Provider  albuterol (PROAIR HFA) 108 (90 Base) MCG/ACT inhaler Inhale 2 puffs into the lungs every 4 (four) hours as needed for wheezing or shortness of breath. 09/02/16   Midge Minium, MD  ALPRAZolam Duanne Moron) 0.5 MG tablet TAKE 1 TABLET BY MOUTH THREE TIMES DAILY 02/10/18   [place], Gay Filler, MD  aspirin (ASPIRIN CHILDRENS) 81 MG chewable tablet Chew  1 tablet (81 mg total) by mouth 2 (two) times daily. 02/18/17   Netta Cedars, MD  canagliflozin [facility]) 100 MG TABS tablet Take 1 tablet (100 mg total) by mouth daily before breakfast. 02/16/18   [place], Gay Filler, MD  colestipol (COLESTID) 1 g tablet Take 1 g by mouth daily.    [provider]  dicyclomine (BENTYL) 20 MG tablet TAKE 1 TABLET BY MOUTH THREE TIMES DAILY AS NEEDED 01/05/18   Midge Minium, MD  diphenhydrAMINE  (BENADRYL) 25 MG tablet Take 25 mg by mouth every 6 (six) hours as needed (for allergies.).    [provider]  fenofibrate 160 MG tablet Take 1 tablet (160 mg total) by mouth daily. 04/16/16   Midge Minium, MD  ferrous sulfate 325 (65 FE) MG EC tablet Take 325 mg by mouth daily.    [provider]  glipiZIDE (GLUCOTROL XL) 5 MG 24 hr tablet TAKE 1 TABLET(5 MG) BY MOUTH DAILY WITH BREAKFAST 12/22/17   Midge Minium, MD  hydrochlorothiazide (HYDRODIURIL) 25 MG tablet TAKE 1 TABLET BY MOUTH EVERY DAY 03/27/18   [place], Gay Filler, MD  hydrocortisone 2.5 % cream Apply topically.    [provider]  ipratropium-albuterol (DUONEB) 0.5-2.5 (3) MG/3ML SOLN Take 3 mLs by nebulization every 6 (six) hours as needed. 01/16/18   [place], Gay Filler, MD  loperamide (IMODIUM) 2 MG capsule Take 4-6 mg by mouth daily as needed (for IBS symptoms).    [provider]  losartan (COZAAR) 50 MG tablet TAKE 1 TABLET BY MOUTH EVERY DAY 03/27/18   [place], Gay Filler, MD  metFORMIN (GLUCOPHAGE) 1000 MG tablet TAKE 1 TABLET BY MOUTH TWICE DAILY WITH MEALS 09/12/17   Midge Minium, MD  Mometasone Furoate [facility] HFA) 100 MCG/ACT AERO Inhale 2 puffs into the lungs 2 (two) times daily. 02/13/18   [place], Gay Filler, MD  omeprazole (PRILOSEC) 40 MG capsule TAKE 1 CAPSULE BY MOUTH EVERY DAY 01/05/18   Midge Minium, MD  ONE TOUCH ULTRA TEST test strip USE AS DIRECTED Patient taking differently: TEST BLOOD SUGARS ONCE DAILY IN THE MORNING 10/26/16   Midge Minium, MD  [facility] DELICA LANCETS FINE MISC 1 Stick by Does not apply route 2 (two) times daily. Patient taking differently: 1 each by Other route daily.  05/14/13   Midge Minium, MD  sertraline (ZOLOFT) 50 MG tablet TAKE 1 TABLET BY MOUTH EVERY DAY 01/05/18   Midge Minium, MD  simvastatin (ZOCOR) 80 MG tablet TAKE 1 TABLET BY MOUTH EVERY DAY 01/05/18   Midge Minium, MD  sitaGLIPtin (JANUVIA) 100 MG  tablet Take 1 tablet (100 mg total) by mouth daily. 01/28/17   Midge Minium, MD    Family History Family History  Problem Relation Age of Onset  . Breast cancer Daughter   . Breast cancer Maternal Aunt   . Diabetes Brother   . Diabetes Sister   . Stomach cancer Mother   . Cancer Other   . GER disease Other   . Obesity Other     Social History Social History   Tobacco Use  . Smoking status: Former Research scientist (life sciences)  . Smokeless tobacco: Never Used  Substance Use Topics  . Alcohol use: No  . Drug use: No     Allergies   Codeine and Shrimp [shellfish allergy]   Review of Systems Review of Systems  Constitutional: Negative for fever.  HENT: Negative for rhinorrhea and sore throat.   Eyes:  Negative for redness.  Respiratory: Positive for shortness of breath. Negative for cough.   Cardiovascular: Negative for chest pain.  Gastrointestinal: Positive for abdominal distention and abdominal pain. Negative for blood in stool, diarrhea, nausea and vomiting.  Genitourinary: Negative for dysuria.  Musculoskeletal: Negative for myalgias.  Skin: Negative for rash.  Neurological: Negative for headaches.     Physical Exam Updated Vital Signs BP (!) 144/64 (BP Location: Left Arm)   Pulse 96   Temp 98.3 F (36.8 C) (Oral)   Resp 20   Ht 5\' 5"  (1.651 m)   Wt 93.4 kg (206 lb)   SpO2 94%   BMI 34.28 kg/m   Physical Exam  Constitutional: She appears well-developed and well-nourished.  HENT:  Head: Normocephalic and atraumatic.  Mouth/Throat: Oropharynx is clear and moist.  Eyes: Conjunctivae are normal. Right eye exhibits no discharge. Left eye exhibits no discharge.  Neck: Normal range of motion. Neck supple.  Cardiovascular: Normal rate, regular rhythm and normal heart sounds.  Pulmonary/Chest: Effort normal and breath sounds normal.  Abdominal: Soft. She exhibits no distension. There is no tenderness. There is no rebound and no guarding.  Neurological: She is alert.  Skin:  Skin is warm and dry.  Psychiatric: She has a normal mood and affect.  Nursing note and vitals reviewed.    ED Treatments / Results  Labs (all labs ordered are listed, but only abnormal results are displayed) Labs Reviewed  CBC WITH DIFFERENTIAL/PLATELET - Abnormal; Notable for the following components:      Result Value   Hemoglobin 11.0 (*)    HCT 33.2 (*)    RDW 15.9 (*)    Platelets 68 (*)    All other components within normal limits  COMPREHENSIVE METABOLIC PANEL - Abnormal; Notable for the following components:   Potassium 2.9 (*)    Chloride 96 (*)    Glucose, Bld 248 (*)    All other components within normal limits  LIPASE, BLOOD  URINALYSIS, ROUTINE W REFLEX MICROSCOPIC    EKG None  Radiology Ct Abdomen Pelvis W Contrast  Result Date: 04/04/2018 CLINICAL DATA:  77 year old hypertensive diabetic female with bloating and abdominal discomfort for the past 4 days. [place]/cirrhosis. Post cholecystectomy, hysterectomy and oophorectomy. Initial encounter. EXAM: CT ABDOMEN AND PELVIS WITH CONTRAST TECHNIQUE: Multidetector CT imaging of the abdomen and pelvis was performed using the standard protocol following bolus administration of intravenous contrast. CONTRAST:  177mL ISOVUE-300 IOPAMIDOL (ISOVUE-300) INJECTION 61% COMPARISON:  01/26/2018 ultrasound.  12/04/2013 CT. FINDINGS: Lower chest: Minimal atelectasis/scarring lung bases. Heart incompletely assessed. Hepatobiliary: Enlarged cirrhotic liver without focal worrisome mass. Calcifications dome of liver. Post cholecystectomy. Common bile duct measures up to 9.7 mm. Slight prominence intrahepatic biliary ducts. Findings consistent with post cholecystectomy state without evidence of obstructing calcified common bile duct stone or mass. Pancreas: No pancreatic mass or inflammation. Spleen: Splenomegaly with spleen spanning over 15.7 cm. No focal splenic lesion. Adrenals/Urinary Tract: No obstructing stone or hydronephrosis. Possible  tiny renal cyst. No worrisome renal or adrenal lesion. Noncontrast filled imaging of the urinary bladder without abnormality noted. Stomach/Bowel: Although there is mild haziness of fat planes anterior abdominal/pelvic wall and left pericolic gutter region this does not appear to be centered around bowel. No primary bowel inflammatory process noted. Portions of stomach and colon under distended without gross abnormality detected. Vascular/Lymphatic: Prominent calcified plaque aorta and aortic branch vessels without aneurysm or large vessel occlusion. Increase number of normal/top-normal size porta hepatis/peripancreatic lymph nodes. Reproductive: Post hysterectomy.  No  worrisome adnexal lesion. Other: No free air or bowel containing hernia. Varices gastrosplenic region. Multiple collateral vessels with mild haziness of omental fat planes and left paracolic gutter. Musculoskeletal: Degenerative changes lower thoracic and lumbar spine most notable L3-4 and L5-S1 level. Multifactorial spinal stenosis L4-5 level. Hip joint degenerative changes greater on the right. Mild sacroiliac joint degenerative changes. IMPRESSION: 1. Enlarged cirrhotic liver without worrisome mass. 2. Post cholecystectomy. Prominence of biliary system felt to be related to post cholecystectomy state without obstructing stone or mass noted. 3. Splenomegaly. 4. Upper abdominal varices. Prominent collateral vessels throughout the abdomen and pelvis with slight haziness of fat planes but without primary bowel inflammatory process noted. 5.  Aortic Atherosclerosis (ICD10-I70.0). Electronically Signed   By: Genia Del M.D.   On: 04/04/2018 13:43    Procedures Procedures (including critical care time)  Medications Ordered in ED Medications  iopamidol (ISOVUE-300) 61 % injection 100 mL (100 mLs Intravenous Contrast Given 04/04/18 1302)     Initial Impression / Assessment and Plan / ED Course  I have reviewed the triage vital signs and the  nursing notes.  Pertinent labs & imaging results that were available during my care of the patient were reviewed by me and considered in my medical decision making (see chart for details).     Patient seen and examined. Work-up initiated. Medications ordered.   Vital signs reviewed and are as follows: BP (!) 144/64 (BP Location: Left Arm)   Pulse 96   Temp 98.3 F (36.8 C) (Oral)   Resp 20   Ht 5\' 5"  (1.651 m)   Wt 93.4 kg (206 lb)   SpO2 94%   BMI 34.28 kg/m   3:12 PM patient discussed with and seen by Dr. Laverta Baltimore.  He updated patient on results.  Plan to give dose of potassium here.  She will need to have her potassium and platelet count rechecked.  She will use MiraLAX to treat any possible constipation type symptoms.  Encouraged follow-up with her primary care doctor and with her GI doctor for further evaluation.  The patient was urged to return to the Emergency Department immediately with worsening of current symptoms, worsening abdominal pain, persistent vomiting, blood noted in stools, fever, or any other concerns. The patient verbalized understanding.    Final Clinical Impressions(s) / ED Diagnoses   Final diagnoses:  Abdominal discomfort  Hypokalemia  Thrombocytopenia ([place])   Patient with abdominal pain, known NASH. Vitals are stable, no fever. Labs reassuring, chronic thrombocytopenia, mild hypokalemia noted today treated with oral potassium. Imaging with no demonstratable ascites, no other acute findings.  Patient does have a significant amount of stool in her large bowel. No signs of dehydration, patient is tolerating PO's. Lungs are clear and no signs suggestive of PNA. Low concern for appendicitis, cholecystitis, pancreatitis, ruptured viscus, UTI, kidney stone, aortic dissection, aortic aneurysm or other emergent abdominal etiology. Supportive therapy indicated with return if symptoms worsen.    ED Discharge Orders        Ordered    polyethylene glycol powder  (GLYCOLAX/MIRALAX) powder  Daily,   Status:  Discontinued     04/04/18 1423    polyethylene glycol powder (GLYCOLAX/MIRALAX) powder  Daily     04/04/18 1428       Carlisle Cater, PA-C 04/04/18 1515    Long, Wonda Olds, MD 04/05/18 1000

## 2018-04-04 NOTE — Discharge Instructions (Signed)
Please read and follow all provided instructions.  Your diagnoses today include:  1. Abdominal discomfort   2. Hypokalemia   3. Thrombocytopenia ([place])    Tests performed today include:  Blood counts and electrolytes  Blood tests to check liver and kidney function  Blood tests to check pancreas function  Urine test to look for infection  CT scan of your abdomen - shows chronic enlargement of liver  Vital signs. See below for your results today.   Medications prescribed:   Miralax - laxative  This medication can be found over-the-counter.   Take any prescribed medications only as directed.  Home care instructions:   Follow any educational materials contained in this packet.  Follow-up instructions: Please follow-up with your primary care provider in the next 3 days for further evaluation of your symptoms.    Return instructions:  SEEK IMMEDIATE MEDICAL ATTENTION IF:  The pain does not go away or becomes severe   A temperature above 101F develops   Repeated vomiting occurs (multiple episodes)   The pain becomes localized to portions of the abdomen. The right side could possibly be appendicitis. In an adult, the left lower portion of the abdomen could be colitis or diverticulitis.   Blood is being passed in stools or vomit (bright red or black tarry stools)   You develop chest pain, difficulty breathing, dizziness or fainting, or become confused, poorly responsive, or inconsolable (young children)  If you have any other emergent concerns regarding your health  Additional Information: Abdominal (belly) pain can be caused by many things. Your caregiver performed an examination and possibly ordered blood/urine tests and imaging (CT scan, x-rays, ultrasound). Many cases can be observed and treated at home after initial evaluation in the emergency department. Even though you are being discharged home, abdominal pain can be unpredictable. Therefore, you need a repeated  exam if your pain does not resolve, returns, or worsens. Most patients with abdominal pain don't have to be admitted to the hospital or have surgery, but serious problems like appendicitis and gallbladder attacks can start out as nonspecific pain. Many abdominal conditions cannot be diagnosed in one visit, so follow-up evaluations are very important.  Your vital signs today were: BP 116/63 (BP Location: Right Arm)    Pulse 72    Temp 98.3 F (36.8 C) (Oral)    Resp 18    Ht 5\' 5"  (1.651 m)    Wt 93.4 kg (206 lb)    SpO2 99%    BMI 34.28 kg/m  If your blood pressure (bp) was elevated above 135/85 this visit, please have this repeated by your doctor within one month. --------------

## 2018-04-05 ENCOUNTER — Ambulatory Visit (INDEPENDENT_AMBULATORY_CARE_PROVIDER_SITE_OTHER): Payer: PPO | Admitting: Family Medicine

## 2018-04-05 ENCOUNTER — Encounter: Payer: Self-pay | Admitting: Family Medicine

## 2018-04-05 VITALS — BP 128/74 | HR 87 | Temp 98.1°F | Resp 20 | Ht 65.0 in | Wt 206.2 lb

## 2018-04-05 DIAGNOSIS — K7460 Unspecified cirrhosis of liver: Secondary | ICD-10-CM

## 2018-04-05 DIAGNOSIS — E119 Type 2 diabetes mellitus without complications: Secondary | ICD-10-CM | POA: Diagnosis not present

## 2018-04-05 DIAGNOSIS — I10 Essential (primary) hypertension: Secondary | ICD-10-CM

## 2018-04-05 DIAGNOSIS — D696 Thrombocytopenia, unspecified: Secondary | ICD-10-CM | POA: Diagnosis not present

## 2018-04-05 DIAGNOSIS — E876 Hypokalemia: Secondary | ICD-10-CM | POA: Diagnosis not present

## 2018-04-05 NOTE — Patient Instructions (Addendum)
It was good to see you today- I am going to check your potassium for you today to see where we are  We will also look at your A1c today- if you are trending up we may go back on a lower dose of metformin for you  Please come in for a blood count to check your platelets in about 2 weeks- I would like to get a level mid way between now and your oncology appt

## 2018-04-06 LAB — BASIC METABOLIC PANEL
BUN: 12 mg/dL (ref 6–23)
CO2: 35 mEq/L — ABNORMAL HIGH (ref 19–32)
Calcium: 10 mg/dL (ref 8.4–10.5)
Chloride: 97 mEq/L (ref 96–112)
Creatinine, Ser: 0.68 mg/dL (ref 0.40–1.20)
GFR: 89.46 mL/min (ref >60.00)
Glucose, Bld: 200 mg/dL — ABNORMAL HIGH (ref 70–99)
Potassium: 4.1 mEq/L (ref 3.5–5.1)
Sodium: 141 mEq/L (ref 135–145)

## 2018-04-06 LAB — HEMOGLOBIN A1C: Hgb A1c MFr Bld: 8.4 % — ABNORMAL HIGH (ref 4.6–6.5)

## 2018-04-10 ENCOUNTER — Other Ambulatory Visit: Payer: Self-pay | Admitting: Family Medicine

## 2018-05-10 ENCOUNTER — Other Ambulatory Visit: Payer: Self-pay | Admitting: Family Medicine

## 2018-05-12 ENCOUNTER — Other Ambulatory Visit: Payer: Self-pay

## 2018-05-12 ENCOUNTER — Inpatient Hospital Stay: Payer: PPO

## 2018-05-12 ENCOUNTER — Inpatient Hospital Stay: Payer: PPO | Attending: Hematology & Oncology | Admitting: Hematology & Oncology

## 2018-05-12 ENCOUNTER — Ambulatory Visit (HOSPITAL_BASED_OUTPATIENT_CLINIC_OR_DEPARTMENT_OTHER)
Admission: RE | Admit: 2018-05-12 | Discharge: 2018-05-12 | Disposition: A | Discharge status: Home / Self Care | Payer: PPO | Source: Ambulatory Visit | Attending: Hematology & Oncology | Admitting: Hematology & Oncology

## 2018-05-12 VITALS — BP 122/58 | HR 88 | Temp 98.1°F | Resp 18 | Wt 205.0 lb

## 2018-05-12 DIAGNOSIS — K7581 Nonalcoholic steatohepatitis (NASH): Secondary | ICD-10-CM

## 2018-05-12 DIAGNOSIS — D696 Thrombocytopenia, unspecified: Secondary | ICD-10-CM | POA: Diagnosis not present

## 2018-05-12 DIAGNOSIS — R161 Splenomegaly, not elsewhere classified: Secondary | ICD-10-CM | POA: Diagnosis not present

## 2018-05-12 DIAGNOSIS — Z79899 Other long term (current) drug therapy: Secondary | ICD-10-CM

## 2018-05-12 LAB — CMP (CANCER CENTER ONLY)
ALT: 26 U/L (ref 10–47)
AST: 44 U/L — ABNORMAL HIGH (ref 11–38)
Albumin: 4 g/dL (ref 3.5–5.0)
Alkaline Phosphatase: 61 U/L (ref 26–84)
Anion gap: 4 — ABNORMAL LOW (ref 5–15)
BUN: 13 mg/dL (ref 7–22)
CO2: 33 mmol/L (ref 18–33)
Calcium: 9.7 mg/dL (ref 8.0–10.3)
Chloride: 100 mmol/L (ref 98–108)
Creatinine: 0.7 mg/dL (ref 0.60–1.20)
Glucose, Bld: 166 mg/dL — ABNORMAL HIGH (ref 73–118)
Potassium: 3.8 mmol/L (ref 3.3–4.7)
Sodium: 137 mmol/L (ref 128–145)
Total Bilirubin: 1.1 mg/dL (ref 0.2–1.6)
Total Protein: 7.8 g/dL (ref 6.4–8.1)

## 2018-05-12 LAB — CBC WITH DIFFERENTIAL (CANCER CENTER ONLY)
Basophils Absolute: 0 10*3/uL (ref 0.0–0.1)
Basophils Relative: 1 %
Eosinophils Absolute: 0.1 10*3/uL (ref 0.0–0.5)
Eosinophils Relative: 1 %
HCT: 35.6 % (ref 34.8–46.6)
Hemoglobin: 11.2 g/dL — ABNORMAL LOW (ref 11.6–15.9)
Lymphocytes Relative: 24 %
Lymphs Abs: 1.7 10*3/uL (ref 0.9–3.3)
MCH: 25.9 pg — ABNORMAL LOW (ref 26.0–34.0)
MCHC: 31.5 g/dL — ABNORMAL LOW (ref 32.0–36.0)
MCV: 82.2 fL (ref 81.0–101.0)
Monocytes Absolute: 0.6 10*3/uL (ref 0.1–0.9)
Monocytes Relative: 9 %
Neutro Abs: 4.5 10*3/uL (ref 1.5–6.5)
Neutrophils Relative %: 65 %
Platelet Count: 80 10*3/uL — ABNORMAL LOW (ref 145–400)
RBC: 4.33 MIL/uL (ref 3.70–5.32)
RDW: 16.4 % — ABNORMAL HIGH (ref 11.1–15.7)
WBC Count: 6.8 10*3/uL (ref 3.9–10.0)

## 2018-05-12 LAB — PLATELET BY CITRATE

## 2018-05-12 NOTE — Progress Notes (Signed)
Hematology and Oncology Follow Up Visit  Rachael Buck [id number] 1940/11/17 77 y.o. 05/12/2018   Principle Diagnosis:   Thrombocytopenia -- NASH/Splenomegaly ; Immune based thrombocytopenia  Current Therapy:    Nplate q week -- surgery on 02/21/2018     Interim History:  Rachael Buck is back for follow-up.  She is doing quite well.  So far, everything is going quite well.  She seems to be doing pretty well.  She got through her surgery for the left leg without any problems.  I think she had some kind of vascular procedure done.  We did do an ultrasound today.  This is to check her spleen size.  The ultrasound showed that her splenic volume was 1300 cm3.this is up a little bit from her last ultrasound which was done back in May.  She is had no bleeding.  She is had no bruising.  Her blood sugars have been doing fairly well.  For right now, I do not see that we have to give her endplate less she is going to have another surgery.  Her platelet count is doing a bit better today.  She is asymptomatic.  Overall, her performance status is ECOG 1.  Medications:  Current Outpatient Medications:  .  albuterol (PROAIR HFA) 108 (90 Base) MCG/ACT inhaler, Inhale 2 puffs into the lungs every 4 (four) hours as needed for wheezing or shortness of breath., Disp: 1 Inhaler, Rfl: 6 .  ALPRAZolam (XANAX) 0.5 MG tablet, TAKE 1 TABLET BY MOUTH THREE TIMES DAILY, Disp: 90 tablet, Rfl: 2 .  aspirin (ASPIRIN CHILDRENS) 81 MG chewable tablet, Chew 1 tablet (81 mg total) by mouth 2 (two) times daily., Disp: 60 tablet, Rfl: 0 .  canagliflozin (INVOKANA) 100 MG TABS tablet, Take 1 tablet (100 mg total) by mouth daily before breakfast., Disp: 30 tablet, Rfl: 6 .  colestipol (COLESTID) 1 g tablet, Take 1 g by mouth daily., Disp: , Rfl:  .  dicyclomine (BENTYL) 20 MG tablet, TAKE 1 TABLET BY MOUTH THREE TIMES DAILY AS NEEDED, Disp: 360 tablet, Rfl: 0 .  diphenhydrAMINE (BENADRYL) 25 MG tablet, Take 25 mg by  mouth every 6 (six) hours as needed (for allergies.)., Disp: , Rfl:  .  fenofibrate 160 MG tablet, Take 1 tablet (160 mg total) by mouth daily., Disp: 90 tablet, Rfl: 1 .  ferrous sulfate 325 (65 FE) MG EC tablet, Take 325 mg by mouth daily., Disp: , Rfl:  .  glipiZIDE (GLUCOTROL XL) 5 MG 24 hr tablet, Take 1 tablet (5 mg total) by mouth daily with breakfast., Disp: 90 tablet, Rfl: 1 .  hydrochlorothiazide (HYDRODIURIL) 25 MG tablet, TAKE 1 TABLET BY MOUTH EVERY DAY, Disp: 90 tablet, Rfl: 1 .  hydrocortisone 2.5 % cream, Apply topically., Disp: , Rfl:  .  ipratropium-albuterol (DUONEB) 0.5-2.5 (3) MG/3ML SOLN, Take 3 mLs by nebulization every 6 (six) hours as needed., Disp: 360 mL, Rfl: 2 .  loperamide (IMODIUM) 2 MG capsule, Take 4-6 mg by mouth daily as needed (for IBS symptoms)., Disp: , Rfl:  .  losartan (COZAAR) 50 MG tablet, Take 1 tablet (50 mg total) by mouth daily., Disp: 90 tablet, Rfl: 1 .  metFORMIN (GLUCOPHAGE) 1000 MG tablet, TAKE 1 TABLET BY MOUTH TWICE DAILY WITH MEALS, Disp: 180 tablet, Rfl: 0 .  Mometasone Furoate (ASMANEX HFA) 100 MCG/ACT AERO, Inhale 2 puffs into the lungs 2 (two) times daily., Disp: 1 Inhaler, Rfl: 12 .  omeprazole (PRILOSEC) 40 MG capsule, Take 1 capsule (  40 mg total) by mouth daily., Disp: 90 capsule, Rfl: 1 .  ONE TOUCH ULTRA TEST test strip, USE AS DIRECTED (Patient taking differently: TEST BLOOD SUGARS ONCE DAILY IN THE MORNING), Disp: 300 each, Rfl: 0 .  ONETOUCH DELICA LANCETS FINE MISC, 1 Stick by Does not apply route 2 (two) times daily. (Patient taking differently: 1 each by Other route daily. ), Disp: 100 each, Rfl: 3 .  polyethylene glycol powder (GLYCOLAX/MIRALAX) powder, Take 17 g by mouth daily., Disp: 255 g, Rfl: 0 .  sertraline (ZOLOFT) 50 MG tablet, TAKE 1 TABLET BY MOUTH EVERY DAY, Disp: 90 tablet, Rfl: 0 .  simvastatin (ZOCOR) 80 MG tablet, TAKE 1 TABLET BY MOUTH EVERY DAY, Disp: 90 tablet, Rfl: 0 .  sitaGLIPtin (JANUVIA) 100 MG tablet, Take  1 tablet (100 mg total) by mouth daily., Disp: 30 tablet, Rfl: 6  Allergies:  Allergies  Allergen Reactions  . Codeine Nausea And Vomiting  . Shrimp [Shellfish Allergy] Swelling    Past Medical History, Surgical history, Social history, and Family History were reviewed and updated.  Review of Systems: Review of Systems  Constitutional: Negative.   HENT:  Negative.   Eyes: Negative.   Respiratory: Positive for cough and shortness of breath.   Cardiovascular: Negative.   Gastrointestinal: Negative.   Endocrine: Negative.   Genitourinary: Negative.    Musculoskeletal: Positive for myalgias.  Skin: Negative.   Neurological: Negative.   Hematological: Negative.   Psychiatric/Behavioral: Negative.     Physical Exam:  weight is 205 lb (93 kg). Her oral temperature is 98.1 F (36.7 C). Her blood pressure is 122/58 (abnormal) and her pulse is 88. Her respiration is 18 and oxygen saturation is 96%.   Wt Readings from Last 3 Encounters:  05/12/18 205 lb (93 kg)  04/05/18 206 lb 3.2 oz (93.5 kg)  04/04/18 206 lb (93.4 kg)    Physical Exam  Constitutional: She is oriented to person, place, and time.  HENT:  Head: Normocephalic and atraumatic.  Mouth/Throat: Oropharynx is clear and moist.  Eyes: Pupils are equal, round, and reactive to light. EOM are normal.  Neck: Normal range of motion.  Cardiovascular: Normal rate, regular rhythm and normal heart sounds.  Pulmonary/Chest: Effort normal and breath sounds normal.  Abdominal: Soft. Bowel sounds are normal.  Musculoskeletal: Normal range of motion. She exhibits no edema, tenderness or deformity.  Lymphadenopathy:    She has no cervical adenopathy.  Neurological: She is alert and oriented to person, place, and time.  Skin: Skin is warm and dry. No rash noted. No erythema.  Psychiatric: She has a normal mood and affect. Her behavior is normal. Judgment and thought content normal.  Vitals reviewed.    Lab Results  Component  Value Date   WBC 6.8 05/12/2018   HGB 11.2 (L) 05/12/2018   HCT 35.6 05/12/2018   MCV 82.2 05/12/2018   PLT 80 (L) 05/12/2018     Chemistry      Component Value Date/Time   NA 141 04/05/2018 1533   NA 127 (L) 02/14/2017 1335   K 4.1 04/05/2018 1533   K 3.6 02/14/2017 1335   CL 97 04/05/2018 1533   CL 92 (L) 02/14/2017 1335   CO2 35 (H) 04/05/2018 1533   CO2 28 02/14/2017 1335   BUN 12 04/05/2018 1533   BUN 7 (L) 02/14/2017 1335   CREATININE 0.68 04/05/2018 1533   CREATININE 0.47 (L) 02/14/2017 1335   CREATININE 0.56 07/24/2014 1204  Component Value Date/Time   CALCIUM 10.0 04/05/2018 1533   CALCIUM 9.3 02/14/2017 1335   ALKPHOS 52 04/04/2018 1156   ALKPHOS 68 02/14/2017 1335   AST 38 04/04/2018 1156   AST 29 02/14/2017 1335   ALT 22 04/04/2018 1156   ALT 20 02/14/2017 1335   BILITOT 0.6 04/04/2018 1156   BILITOT 0.7 02/14/2017 1335         Impression and Plan: Ms. Hepp is a 77 year old white female.  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  We will just watch her for right now.  Again I do not see that we have to embark upon any Nplate as she is asymptomatic.  A platelet count of 80,000 should be adequate for hemostasis.  I will plan to see her back right before the holidays.  I want to make sure that her platelet count is going to be okay so she will not have any issues over the holiday season.     [name], MD 9/13/201910:11 AM

## 2018-05-16 DIAGNOSIS — E119 Type 2 diabetes mellitus without complications: Secondary | ICD-10-CM | POA: Diagnosis not present

## 2018-05-16 LAB — HM DIABETES EYE EXAM

## 2018-05-18 ENCOUNTER — Other Ambulatory Visit: Payer: Self-pay | Admitting: Family Medicine

## 2018-05-18 DIAGNOSIS — D2310 Other benign neoplasm of skin of unspecified eyelid, including canthus: Secondary | ICD-10-CM | POA: Diagnosis not present

## 2018-05-18 DIAGNOSIS — E119 Type 2 diabetes mellitus without complications: Secondary | ICD-10-CM

## 2018-05-18 MED ORDER — GLUCOSE BLOOD VI STRP: ORAL_STRIP | 12 refills | Status: DC

## 2018-05-18 MED ORDER — ONETOUCH DELICA LANCETS FINE MISC
1.0000 | Freq: Every day | 12 refills | Status: AC
Start: 2018-05-18 — End: ?

## 2018-05-18 NOTE — Telephone Encounter (Signed)
Copied from [place] [id number]. Topic: Quick Communication - Rx Refill/Question >> May 18, 2018 12:27 PM Sheran Luz wrote: Medication: Jonetta Speak LANCETS [place] [[id number]]  and ONE TOUCH ULTRA TEST test strip [[id number]]     Has the patient contacted their pharmacy? Was advised by pharmacy to contact office Preferred Pharmacy (with phone number or street name): [id number] [address], [place] AT [facility] [address] [place] [phone] (Phone) [phone] (Fax)

## 2018-05-18 NOTE — Telephone Encounter (Signed)
Refills sent

## 2018-05-24 DIAGNOSIS — K766 Portal hypertension: Secondary | ICD-10-CM | POA: Diagnosis not present

## 2018-05-24 DIAGNOSIS — K317 Polyp of stomach and duodenum: Secondary | ICD-10-CM | POA: Diagnosis not present

## 2018-05-24 DIAGNOSIS — K449 Diaphragmatic hernia without obstruction or gangrene: Secondary | ICD-10-CM | POA: Diagnosis not present

## 2018-05-24 DIAGNOSIS — K2950 Unspecified chronic gastritis without bleeding: Secondary | ICD-10-CM | POA: Diagnosis not present

## 2018-05-29 DIAGNOSIS — K317 Polyp of stomach and duodenum: Secondary | ICD-10-CM | POA: Diagnosis not present

## 2018-06-07 ENCOUNTER — Other Ambulatory Visit: Payer: Self-pay | Admitting: Family Medicine

## 2018-06-07 ENCOUNTER — Ambulatory Visit: Payer: PPO | Admitting: Vascular Surgery

## 2018-06-07 ENCOUNTER — Ambulatory Visit (INDEPENDENT_AMBULATORY_CARE_PROVIDER_SITE_OTHER): Payer: PPO

## 2018-06-07 DIAGNOSIS — Z23 Encounter for immunization: Secondary | ICD-10-CM

## 2018-06-08 ENCOUNTER — Ambulatory Visit: Payer: PPO | Admitting: Vascular Surgery

## 2018-06-08 ENCOUNTER — Telehealth: Payer: Self-pay | Admitting: *Deleted

## 2018-06-08 NOTE — Telephone Encounter (Signed)
05/03/2018  1  02/10/2018  Alprazolam 0.5 Mg Tablet  90.00 30 Je Cop  [id number]  [facility] ([id number])  2/2 3.00 LME Comm Ins  [place]  03/24/2018  1  02/10/2018  Alprazolam 0.5 Mg Tablet  90.00 30 Je Cop  [id number]  [facility] ([id number])  1/2 3.00 LME Comm Ins  [place]  02/10/2018  1  02/10/2018  Alprazolam 0.5 Mg Tablet  90.00 30 Je Cop  [id number]  [facility] ([id number])  0/2 3.00 LME Comm Ins  [place]  01/05/2018  1  11/30/2017  Alprazolam 0.5 Mg Tablet  90.00 30 Ka Tab  [id number]  [facility] ([id number])  1/1 3.00 LME Comm Ins  [place]  11/30/2017  1  11/30/2017  Alprazolam 0.5 Mg Tablet  90.00 30 Ka Tab  [id number]  [facility] ([id number])  0/1 3.00 LME Comm Ins  [place]  10/19/2017  1  10/19/2017  Alprazolam 0.5 Mg Tablet  90.00 30 Ka Tab  [id number]  [facility] ([id number])  0/0 3.00 LME Comm Ins    09/12/2017  1  09/12/2017  Alprazolam 0.5 Mg Tablet  90.00 30 Ka Tab  [id number]  [facility] ([id number])  0/0 3

## 2018-06-08 NOTE — Telephone Encounter (Signed)
Received Medical records from [facility] for Upper GI Endoscopy; forwarded to provider/SLS 10/10

## 2018-06-08 NOTE — Telephone Encounter (Signed)
Requesting: xanax 0.5mg  tid Contract: 2014 UDS: none found Last OV: 04/05/2018 Next Ov: n/a Last refill: 02/10/18, #90, 2RF Database: no discrepancies  Please advise.

## 2018-06-09 ENCOUNTER — Telehealth: Payer: Self-pay | Admitting: *Deleted

## 2018-06-09 NOTE — Telephone Encounter (Signed)
Received Eye Exam results from [facility]; forwarded to provider/SLS 10/11

## 2018-06-13 DIAGNOSIS — Z96652 Presence of left artificial knee joint: Secondary | ICD-10-CM | POA: Diagnosis not present

## 2018-06-13 DIAGNOSIS — Z471 Aftercare following joint replacement surgery: Secondary | ICD-10-CM | POA: Diagnosis not present

## 2018-06-20 ENCOUNTER — Other Ambulatory Visit (HOSPITAL_COMMUNITY): Payer: Self-pay | Admitting: Physician Assistant

## 2018-06-20 DIAGNOSIS — Z96652 Presence of left artificial knee joint: Secondary | ICD-10-CM

## 2018-06-26 ENCOUNTER — Ambulatory Visit (HOSPITAL_COMMUNITY)
Admission: RE | Admit: 2018-06-26 | Discharge: 2018-06-26 | Disposition: A | Discharge status: Home / Self Care | Payer: PPO | Source: Ambulatory Visit | Attending: Physician Assistant | Admitting: Physician Assistant

## 2018-06-26 DIAGNOSIS — Z96652 Presence of left artificial knee joint: Secondary | ICD-10-CM

## 2018-06-26 DIAGNOSIS — Z471 Aftercare following joint replacement surgery: Secondary | ICD-10-CM | POA: Diagnosis not present

## 2018-06-26 MED ORDER — TECHNETIUM TC 99M MEDRONATE IV KIT
21.8000 | PACK | Freq: Once | INTRAVENOUS | Status: AC | PRN
  Administered 2018-06-26: 21.8 via INTRAVENOUS

## 2018-07-06 ENCOUNTER — Inpatient Hospital Stay: Payer: PPO

## 2018-07-06 ENCOUNTER — Encounter: Payer: Self-pay | Admitting: Hematology & Oncology

## 2018-07-06 ENCOUNTER — Inpatient Hospital Stay: Payer: PPO | Attending: Hematology & Oncology | Admitting: Hematology & Oncology

## 2018-07-06 ENCOUNTER — Other Ambulatory Visit: Payer: Self-pay

## 2018-07-06 VITALS — BP 126/66 | HR 98 | Temp 98.2°F | Resp 18 | Wt 205.0 lb

## 2018-07-06 DIAGNOSIS — D6959 Other secondary thrombocytopenia: Secondary | ICD-10-CM | POA: Diagnosis not present

## 2018-07-06 DIAGNOSIS — R161 Splenomegaly, not elsewhere classified: Secondary | ICD-10-CM

## 2018-07-06 DIAGNOSIS — K7460 Unspecified cirrhosis of liver: Secondary | ICD-10-CM | POA: Diagnosis not present

## 2018-07-06 DIAGNOSIS — E119 Type 2 diabetes mellitus without complications: Secondary | ICD-10-CM | POA: Insufficient documentation

## 2018-07-06 DIAGNOSIS — Z7984 Long term (current) use of oral hypoglycemic drugs: Secondary | ICD-10-CM | POA: Insufficient documentation

## 2018-07-06 DIAGNOSIS — D500 Iron deficiency anemia secondary to blood loss (chronic): Secondary | ICD-10-CM

## 2018-07-06 DIAGNOSIS — Z7982 Long term (current) use of aspirin: Secondary | ICD-10-CM | POA: Insufficient documentation

## 2018-07-06 DIAGNOSIS — K7581 Nonalcoholic steatohepatitis (NASH): Secondary | ICD-10-CM

## 2018-07-06 DIAGNOSIS — Z79899 Other long term (current) drug therapy: Secondary | ICD-10-CM | POA: Insufficient documentation

## 2018-07-06 LAB — CBC WITH DIFFERENTIAL (CANCER CENTER ONLY)
Abs Immature Granulocytes: 0.03 10*3/uL (ref 0.00–0.07)
Basophils Absolute: 0 10*3/uL (ref 0.0–0.1)
Basophils Relative: 0 %
Eosinophils Absolute: 0 10*3/uL (ref 0.0–0.5)
Eosinophils Relative: 1 %
HCT: 34.8 % — ABNORMAL LOW (ref 36.0–46.0)
Hemoglobin: 10.6 g/dL — ABNORMAL LOW (ref 12.0–15.0)
Immature Granulocytes: 1 %
Lymphocytes Relative: 26 %
Lymphs Abs: 1.3 10*3/uL (ref 0.7–4.0)
MCH: 24.7 pg — ABNORMAL LOW (ref 26.0–34.0)
MCHC: 30.5 g/dL (ref 30.0–36.0)
MCV: 81.1 fL (ref 80.0–100.0)
Monocytes Absolute: 0.4 10*3/uL (ref 0.1–1.0)
Monocytes Relative: 8 %
Neutro Abs: 3.3 10*3/uL (ref 1.7–7.7)
Neutrophils Relative %: 64 %
Platelet Count: 81 10*3/uL — ABNORMAL LOW (ref 150–400)
RBC: 4.29 MIL/uL (ref 3.87–5.11)
RDW: 16.8 % — ABNORMAL HIGH (ref 11.5–15.5)
WBC Count: 5.2 10*3/uL (ref 4.0–10.5)
nRBC: 0 % (ref 0.0–0.2)

## 2018-07-06 LAB — CMP (CANCER CENTER ONLY)
ALT: 29 U/L (ref 10–47)
AST: 37 U/L (ref 11–38)
Albumin: 3.8 g/dL (ref 3.5–5.0)
Alkaline Phosphatase: 57 U/L (ref 26–84)
Anion gap: 13 (ref 5–15)
BUN: 15 mg/dL (ref 7–22)
CO2: 30 mmol/L (ref 18–33)
Calcium: 9.9 mg/dL (ref 8.0–10.3)
Chloride: 98 mmol/L (ref 98–108)
Creatinine: 0.8 mg/dL (ref 0.60–1.20)
Glucose, Bld: 224 mg/dL — ABNORMAL HIGH (ref 73–118)
Potassium: 3.8 mmol/L (ref 3.3–4.7)
Sodium: 141 mmol/L (ref 128–145)
Total Bilirubin: 1 mg/dL (ref 0.2–1.6)
Total Protein: 7.5 g/dL (ref 6.4–8.1)

## 2018-07-06 LAB — PLATELET BY CITRATE

## 2018-07-06 NOTE — Progress Notes (Signed)
Hematology and Oncology Follow Up Visit  Rachael Buck [id number] 26-Oct-1940 77 y.o. 07/06/2018   Principle Diagnosis:   Thrombocytopenia -- NASH/Splenomegaly ; Immune based thrombocytopenia  Current Therapy:    Nplate q week -- surgery on 02/21/2018     Interim History:  Rachael Buck is back for follow-up.  She is doing quite well.  She actually had an upper endoscopy back in early September.  There is no mention of any varices.  She had several gastric polyps.  These were removed.  I do not think that there was any issue with these polyps.  The pathology report showed the polyps were hyperplastic.  The H.  Pylori on test was negative.  She does feel a little tired.  She is had no bleeding or bruising.  She is had no change in bowel or bladder habits.  She has a little bit of diarrhea.  Her last splenic ultrasound was done back in mid September.  I will get another one set up for we see her back.  She is had no rashes.  She does have diabetes.  I am not sure how well she follows her blood sugars.  Overall, her performance status is ECOG 1-2.    Medications:  Current Outpatient Medications:  .  albuterol (PROAIR HFA) 108 (90 Base) MCG/ACT inhaler, Inhale 2 puffs into the lungs every 4 (four) hours as needed for wheezing or shortness of breath., Disp: 1 Inhaler, Rfl: 6 .  ALPRAZolam (XANAX) 0.5 MG tablet, TAKE 1 TABLET BY MOUTH THREE TIMES DAILY, Disp: 90 tablet, Rfl: 2 .  aspirin (ASPIRIN CHILDRENS) 81 MG chewable tablet, Chew 1 tablet (81 mg total) by mouth 2 (two) times daily., Disp: 60 tablet, Rfl: 0 .  canagliflozin (INVOKANA) 100 MG TABS tablet, Take 1 tablet (100 mg total) by mouth daily before breakfast., Disp: 30 tablet, Rfl: 6 .  colestipol (COLESTID) 1 g tablet, Take 1 g by mouth daily., Disp: , Rfl:  .  dicyclomine (BENTYL) 20 MG tablet, TAKE 1 TABLET BY MOUTH THREE TIMES DAILY AS NEEDED, Disp: 360 tablet, Rfl: 0 .  diphenhydrAMINE (BENADRYL) 25 MG tablet, Take  25 mg by mouth every 6 (six) hours as needed (for allergies.)., Disp: , Rfl:  .  fenofibrate 160 MG tablet, Take 1 tablet (160 mg total) by mouth daily., Disp: 90 tablet, Rfl: 1 .  ferrous sulfate 325 (65 FE) MG EC tablet, Take 325 mg by mouth daily., Disp: , Rfl:  .  glipiZIDE (GLUCOTROL XL) 5 MG 24 hr tablet, Take 1 tablet (5 mg total) by mouth daily with breakfast., Disp: 90 tablet, Rfl: 1 .  glucose blood (ONE TOUCH ULTRA TEST) test strip, TEST BLOOD SUGARS ONCE DAILY IN THE MORNING, Disp: 100 each, Rfl: 12 .  hydrochlorothiazide (HYDRODIURIL) 25 MG tablet, TAKE 1 TABLET BY MOUTH EVERY DAY, Disp: 90 tablet, Rfl: 1 .  hydrocortisone 2.5 % cream, Apply topically., Disp: , Rfl:  .  ipratropium-albuterol (DUONEB) 0.5-2.5 (3) MG/3ML SOLN, Take 3 mLs by nebulization every 6 (six) hours as needed., Disp: 360 mL, Rfl: 2 .  loperamide (IMODIUM) 2 MG capsule, Take 4-6 mg by mouth daily as needed (for IBS symptoms)., Disp: , Rfl:  .  losartan (COZAAR) 50 MG tablet, Take 1 tablet (50 mg total) by mouth daily., Disp: 90 tablet, Rfl: 1 .  metFORMIN (GLUCOPHAGE) 1000 MG tablet, TAKE 1 TABLET BY MOUTH TWICE DAILY WITH MEALS, Disp: 180 tablet, Rfl: 0 .  Mometasone Furoate (ASMANEX HFA) 100  MCG/ACT AERO, Inhale 2 puffs into the lungs 2 (two) times daily., Disp: 1 Inhaler, Rfl: 12 .  omeprazole (PRILOSEC) 40 MG capsule, Take 1 capsule (40 mg total) by mouth daily., Disp: 90 capsule, Rfl: 1 .  ONETOUCH DELICA LANCETS FINE MISC, 1 each by Other route daily., Disp: 100 each, Rfl: 12 .  polyethylene glycol powder (GLYCOLAX/MIRALAX) powder, Take 17 g by mouth daily., Disp: 255 g, Rfl: 0 .  sertraline (ZOLOFT) 50 MG tablet, TAKE 1 TABLET BY MOUTH EVERY DAY, Disp: 90 tablet, Rfl: 0 .  simvastatin (ZOCOR) 80 MG tablet, TAKE 1 TABLET BY MOUTH EVERY DAY, Disp: 90 tablet, Rfl: 0 .  sitaGLIPtin (JANUVIA) 100 MG tablet, Take 1 tablet (100 mg total) by mouth daily., Disp: 30 tablet, Rfl: 6  Allergies:  Allergies  Allergen  Reactions  . Codeine Nausea And Vomiting  . Shrimp [Shellfish Allergy] Swelling    Past Medical History, Surgical history, Social history, and Family History were reviewed and updated.  Review of Systems: Review of Systems  Constitutional: Negative.   HENT:  Negative.   Eyes: Negative.   Respiratory: Positive for cough and shortness of breath.   Cardiovascular: Negative.   Gastrointestinal: Negative.   Endocrine: Negative.   Genitourinary: Negative.    Musculoskeletal: Positive for myalgias.  Skin: Negative.   Neurological: Negative.   Hematological: Negative.   Psychiatric/Behavioral: Negative.     Physical Exam:  weight is 205 lb (93 kg). Her oral temperature is 98.2 F (36.8 C). Her blood pressure is 126/66 and her pulse is 98. Her respiration is 18 and oxygen saturation is 95%.   Wt Readings from Last 3 Encounters:  07/06/18 205 lb (93 kg)  05/12/18 205 lb (93 kg)  04/05/18 206 lb 3.2 oz (93.5 kg)    Physical Exam  Constitutional: She is oriented to person, place, and time.  HENT:  Head: Normocephalic and atraumatic.  Mouth/Throat: Oropharynx is clear and moist.  Eyes: Pupils are equal, round, and reactive to light. EOM are normal.  Neck: Normal range of motion.  Cardiovascular: Normal rate, regular rhythm and normal heart sounds.  Pulmonary/Chest: Effort normal and breath sounds normal.  Abdominal: Soft. Bowel sounds are normal.  Musculoskeletal: Normal range of motion. She exhibits no edema, tenderness or deformity.  Lymphadenopathy:    She has no cervical adenopathy.  Neurological: She is alert and oriented to person, place, and time.  Skin: Skin is warm and dry. No rash noted. No erythema.  Psychiatric: She has a normal mood and affect. Her behavior is normal. Judgment and thought content normal.  Vitals reviewed.    Lab Results  Component Value Date   WBC 5.2 07/06/2018   HGB 10.6 (L) 07/06/2018   HCT 34.8 (L) 07/06/2018   MCV 81.1 07/06/2018   PLT  81 (L) 07/06/2018     Chemistry      Component Value Date/Time   NA 141 07/06/2018 0932   NA 127 (L) 02/14/2017 1335   K 3.8 07/06/2018 0932   K 3.6 02/14/2017 1335   CL 98 07/06/2018 0932   CL 92 (L) 02/14/2017 1335   CO2 30 07/06/2018 0932   CO2 28 02/14/2017 1335   BUN 15 07/06/2018 0932   BUN 7 (L) 02/14/2017 1335   CREATININE 0.80 07/06/2018 0932   CREATININE 0.47 (L) 02/14/2017 1335   CREATININE 0.56 07/24/2014 1204      Component Value Date/Time   CALCIUM 9.9 07/06/2018 0932   CALCIUM 9.3 02/14/2017 1335  ALKPHOS 57 07/06/2018 0932   ALKPHOS 68 02/14/2017 1335   AST 37 07/06/2018 0932   ALT 29 07/06/2018 0932   BILITOT 1.0 07/06/2018 0932         Impression and Plan: Rachael Buck is a 77 year old white female.  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  We will just watch her for right now.  Again I do not see that we have to embark upon any Nplate as she is asymptomatic.  A platelet count of 81,000 should be adequate for hemostasis.  I want to get her back now in about 3-4 months.  I think we get her through the holidays and get her through the winter time.   [name], MD 11/7/201910:30 AM

## 2018-07-12 DIAGNOSIS — M25562 Pain in left knee: Secondary | ICD-10-CM | POA: Diagnosis not present

## 2018-07-24 ENCOUNTER — Other Ambulatory Visit: Payer: Self-pay | Admitting: Family Medicine

## 2018-09-04 ENCOUNTER — Ambulatory Visit (INDEPENDENT_AMBULATORY_CARE_PROVIDER_SITE_OTHER): Payer: PPO | Admitting: Family Medicine

## 2018-09-04 ENCOUNTER — Ambulatory Visit: Payer: Self-pay | Admitting: *Deleted

## 2018-09-04 ENCOUNTER — Encounter: Payer: Self-pay | Admitting: Family Medicine

## 2018-09-04 VITALS — Resp 16 | Ht 65.0 in | Wt 205.0 lb

## 2018-09-04 DIAGNOSIS — I952 Hypotension due to drugs: Secondary | ICD-10-CM

## 2018-09-04 DIAGNOSIS — D696 Thrombocytopenia, unspecified: Secondary | ICD-10-CM

## 2018-09-04 DIAGNOSIS — E119 Type 2 diabetes mellitus without complications: Secondary | ICD-10-CM

## 2018-09-04 NOTE — Progress Notes (Addendum)
[place] at [facility] [address] [place], [place] [address] [phone]  Date:  09/04/2018   Name:  Rachael Buck   DOB:  02-11-1941   MRN:  [id number]  PCP:  [name], MD    Chief Complaint: Fatigue (fell on sunday in parking lot, hit head-knot on head, bp 105/61, swelling in knee)   History of Present Illness:  Rachael Buck is a 78 y.o. very pleasant female patient who presents with the following:  Today is Monday On Sunday she parked her car, got out and somehow slipped and fell onto the ground.  She is not sure if this might have been due to her BP dropping or if she slipped. She got herself up, but then she fell back down and hit her head on a nearby tree.   She did not have any LOC She did not cut her head, but did have a small goose egg on the left scalp.  This is now improved.  She had a headache yesterday, took Tylenol.  Now feeling better  She notes that her BP was low when she got home that day She has noticed that signs when she stands up from a seated she will feel like her blood pressure is low and she is dizzy.  She had to hold on the sling for a moment or 2 until her equilibrium returns.  She is under more stress as her husband [name] was dx with alzheimer's disease.  He has always been a very sweet and gentle man, but Alzheimer's has change his personality some.  He is sometimes now mean or unpleasant.  He is being treated by neurology, he is still able to drive but only on the smaller roads  Her left knee was replaced in June of 2018 Her knee has bothered her some since it was replaced.  She went to [place]  BP Readings from Last 3 Encounters:  09/04/18 118/60  07/06/18 126/66  05/12/18 (!) 122/58   10/18- 141/86 8/17-  132/86  She tried to take metformin but could really not tolerate it- she is not taking this  She is on [place] and glipizide for her blood sugar at this time. We will  check an A1c for today. She also has history of thrombocytopenia, will check her CBC today  She is not taking baby aspirin at this time  Lab Results  Component Value Date   HGBA1C 8.4 (H) 04/05/2018    Patient Active Problem List   Diagnosis Date Noted  . Cirrhosis of liver ([place]) 02/17/2018  . 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 Thrombocytopenia ([place]) 02/15/2017  . Varicose veins of left lower extremity with complications 93/23/5573  . Age-related cognitive decline 06/02/2015  . Fatigue 07/19/2014  . Orthostatic hypotension 05/14/2013  . Bronchitis with asthma, acute 01/29/2013  . Nail abnormality 09/04/2012  . Bleeding disorder ([place]) 05/08/2012  . Varices, esophageal ([place]) 03/21/2012  . NASH (nonalcoholic steatohepatitis) 03/21/2012  . HTN (hypertension) 12/21/2011  . Depression 03/28/2011  . IBS (irritable bowel syndrome) 03/23/2011  . INTRINSIC ASTHMA, UNSPECIFIED 08/27/2010  . DYSPNEA ON EXERTION 07/27/2010  . Uncontrolled diabetes mellitus ([place]) 07/24/2010  . MIXED HYPERLIPIDEMIA 07/24/2010  . OBESITY, UNSPECIFIED 07/24/2010  . ALLERGIC RHINITIS CAUSE UNSPECIFIED 07/24/2010  . REFLUX ESOPHAGITIS 07/24/2010  . PAIN IN JOINT PELVIC REGION AND THIGH 07/24/2010    Past Medical History:  Diagnosis Date  . Anxiety   .  Arthritis   . Asthma   . Cirrhosis, nonalcoholic ([place])   . Colon polyps   . Diabetes mellitus   . Diverticulitis   . GERD (gastroesophageal reflux disease)   . Hyperlipidemia   . Hypertension   . NASH (nonalcoholic steatohepatitis)   . Obesity   . Pneumonia     Past Surgical History:  Procedure Laterality Date  . CHOLECYSTECTOMY  1981  . ENDOVENOUS ABLATION SAPHENOUS VEIN W/ LASER Left 02/21/2018   endovenous laser ablation left greater saphenous vein by Tinnie Gens MD   . [place]  . PARTIAL HYSTERECTOMY  1972  . ROTATOR CUFF REPAIR     bilateral. 2006, 2008  . 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 TOTAL KNEE  ARTHROPLASTY Left 02/18/2017   Procedure: LEFT TOTAL KNEE ARTHROPLASTY;  Surgeon: Netta Cedars, MD;  Location: [facility];  Service: Orthopedics;  Laterality: Left;  Marland Kitchen VEIN SURGERY      Social History   Tobacco Use  . Smoking status: Former Research scientist (life sciences)  . Smokeless tobacco: Never Used  Substance Use Topics  . Alcohol use: No  . Drug use: No    Family History  Problem Relation Age of Onset  . Breast cancer Daughter   . Breast cancer Maternal Aunt   . Diabetes Brother   . Diabetes Sister   . Stomach cancer Mother   . Cancer Other   . GER disease Other   . Obesity Other     Allergies  Allergen Reactions  . Codeine Nausea And Vomiting  . Shrimp [Shellfish Allergy] Swelling    Medication list has been reviewed and updated.  Current Outpatient Medications on File Prior to Visit  Medication Sig Dispense Refill  . albuterol (PROAIR HFA) 108 (90 Base) MCG/ACT inhaler Inhale 2 puffs into the lungs every 4 (four) hours as needed for wheezing or shortness of breath. 1 Inhaler 6  . ALPRAZolam (XANAX) 0.5 MG tablet TAKE 1 TABLET BY MOUTH THREE TIMES DAILY 90 tablet 2  . aspirin (ASPIRIN CHILDRENS) 81 MG chewable tablet Chew 1 tablet (81 mg total) by mouth 2 (two) times daily. 60 tablet 0  . canagliflozin (INVOKANA) 100 MG TABS tablet Take 1 tablet (100 mg total) by mouth daily before breakfast. 30 tablet 6  . colestipol (COLESTID) 1 g tablet Take 1 g by mouth daily.    Marland Kitchen dicyclomine (BENTYL) 20 MG tablet TAKE 1 TABLET BY MOUTH THREE TIMES DAILY AS NEEDED 360 tablet 0  . diphenhydrAMINE (BENADRYL) 25 MG tablet Take 25 mg by mouth every 6 (six) hours as needed (for allergies.).    Marland Kitchen fenofibrate 160 MG tablet Take 1 tablet (160 mg total) by mouth daily. 90 tablet 1  . ferrous sulfate 325 (65 FE) MG EC tablet Take 325 mg by mouth daily.    Marland Kitchen glipiZIDE (GLUCOTROL XL) 5 MG 24 hr tablet Take 1 tablet (5 mg total) by mouth daily with breakfast. 90 tablet 1  . glucose blood (ONE TOUCH ULTRA TEST) test  strip TEST BLOOD SUGARS ONCE DAILY IN THE MORNING 100 each 12  . hydrochlorothiazide (HYDRODIURIL) 25 MG tablet TAKE 1 TABLET BY MOUTH EVERY DAY 90 tablet 1  . hydrocortisone 2.5 % cream Apply topically.    Marland Kitchen ipratropium-albuterol (DUONEB) 0.5-2.5 (3) MG/3ML SOLN Take 3 mLs by nebulization every 6 (six) hours as needed. 360 mL 2  . loperamide (IMODIUM) 2 MG capsule Take 4-6 mg by mouth daily as needed (for IBS symptoms).    . losartan (  COZAAR) 50 MG tablet Take 1 tablet (50 mg total) by mouth daily. 90 tablet 1  . metFORMIN (GLUCOPHAGE) 1000 MG tablet TAKE 1 TABLET BY MOUTH TWICE DAILY WITH MEALS 180 tablet 0  . Mometasone Furoate (ASMANEX HFA) 100 MCG/ACT AERO Inhale 2 puffs into the lungs 2 (two) times daily. 1 Inhaler 12  . omeprazole (PRILOSEC) 40 MG capsule Take 1 capsule (40 mg total) by mouth daily. 90 capsule 1  . ONETOUCH DELICA LANCETS FINE MISC 1 each by Other route daily. 100 each 12  . polyethylene glycol powder (GLYCOLAX/MIRALAX) powder Take 17 g by mouth daily. 255 g 0  . sertraline (ZOLOFT) 50 MG tablet TAKE 1 TABLET BY MOUTH EVERY DAY 90 tablet 1  . simvastatin (ZOCOR) 80 MG tablet TAKE 1 TABLET BY MOUTH EVERY DAY 90 tablet 1  . sitaGLIPtin (JANUVIA) 100 MG tablet Take 1 tablet (100 mg total) by mouth daily. 30 tablet 6   No current facility-administered medications on file prior to visit.     Review of Systems:  No unusual SOB, no CP She gets lightheaded if she stands up quickly- has noted over the last few years, getting worse   Physical Examination: Vitals:   09/04/18 1440  BP: 118/60  Pulse: 89  Resp: 16  SpO2: 98%   Vitals:   09/04/18 1440  Weight: 205 lb (93 kg)  Height: 5\' 5"  (1.651 m)   Body mass index is 34.11 kg/m. Ideal Body Weight: Weight in (lb) to have BMI = 25: 149.9  GEN: WDWN, NAD, Non-toxic, A & O x 3, obese, looks well  HEENT: Atraumatic, Normocephalic. Neck supple. No masses, No LAD.  Bilateral TM wnl, oropharynx normal.  PEERL,EOMI.    Ears and Nose: No external deformity. CV: RRR, No M/G/R. No JVD. No thrill. No extra heart sounds. PULM: CTA B, no wheezes, crackles, rhonchi. No retractions. No resp. distress. No accessory muscle use. ABD: S, NT, ND, +BS. No rebound. No HSM.  Belly is benign EXTR: No c/c/e NEURO Normal gait.  Normal strength and sensation of all limbs. PSYCH: Normally interactive. Conversant. Not depressed or anxious appearing.  Calm demeanor.   See orthostatic vitals- positive for drop in blood pressure increase in pulse  EKG: stable from previous EKG 12/2017, SR with right BBB No acute change  Assessment and Plan: Hypotension due to drugs - Plan: EKG 12-Lead  Controlled type 2 diabetes mellitus without complication, without long-term current use of insulin ([place]) - Plan: Hemoglobin A1c, Comprehensive metabolic panel  Thrombocytopenia ([place]) - Plan: CBC  Is here today for routine labs, and also to discuss an episode from a few days ago.  She had stood up from seated/ driving and felt suddenly lightheaded, she fell down twice.  She has noted to have orthostatic hypotension today.  She is taking both HCTZ and losartan.  We will have her decrease her dose of both these medications by half.  She plans to sign up for my chart, and will let me know how her blood pressure does with this decrease. We will also check her A1c today.  She is taking glipizide, and Januvia.  If her A1c is still high we may exchange Januvia for a different but possibly more effective medication.  Branded medications are difficult for her to pay for and she really cannot afford more than one  Offered support with her husband's health change.  She reports that he has not been at all aggressive with her, but she will let  me know if she feels in any danger  Will plan further follow- up pending labs.   Signed Lamar Blinks, MD  09/05/17-received her labs, as below.  Message to patient Low level anemia is present going back to  2015 Colonoscopy done 2016  Results for orders placed or performed in visit on 09/04/18  Hemoglobin A1c  Result Value Ref Range   Hgb A1c MFr Bld 8.8 (H) 4.6 - 6.5 %  CBC  Result Value Ref Range   WBC 5.9 4.0 - 10.5 K/uL   RBC 4.33 3.87 - 5.11 Mil/uL   Platelets 78.0 (L) 150.0 - 400.0 K/uL   Hemoglobin 11.1 (L) 12.0 - 15.0 g/dL   HCT 34.3 (L) 36.0 - 46.0 %   MCV 79.2 78.0 - 100.0 fl   MCHC 32.5 30.0 - 36.0 g/dL   RDW 17.4 (H) 11.5 - 15.5 %  Comprehensive metabolic panel  Result Value Ref Range   Sodium 138 135 - 145 mEq/L   Potassium 3.4 (L) 3.5 - 5.1 mEq/L   Chloride 96 96 - 112 mEq/L   CO2 32 19 - 32 mEq/L   Glucose, Bld 224 (H) 70 - 99 mg/dL   BUN 14 6 - 23 mg/dL   Creatinine, Ser 0.67 0.40 - 1.20 mg/dL   Total Bilirubin 0.7 0.2 - 1.2 mg/dL   Alkaline Phosphatase 57 39 - 117 U/L   AST 28 0 - 37 U/L   ALT 17 0 - 35 U/L   Total Protein 7.2 6.0 - 8.3 g/dL   Albumin 4.3 3.5 - 5.2 g/dL   Calcium 9.7 8.4 - 10.5 mg/dL   GFR 90.90 >60.00 mL/min

## 2018-09-04 NOTE — Telephone Encounter (Signed)
Pt called stating that she has not been feeling good and hasn't had much energy; she states when she got of her car she fell; she is not sure if her knee gave way; when she tried to get up she feel backward and hit her head on a tree; the pt is not having pain after hitting her head; the pt says that she did take 3 tylenol on 09/03/2018 due to a headache; she is concerned about her weakness, and it is time for her A1C check; the pt says that this has been going on since September 2019, and she has no energy; recommendations made per nurse triage protocol; pt offered and accepted appointment with Dr Lamar Blinks, [place], 09/04/2018 at 1445; the pt verbalized understanding; will route to office for notification of this upcoming appointment.   Reason for Disposition . [1] MODERATE weakness (i.e., interferes with work, school, normal activities) AND [2] persists > 3 days  Answer Assessment - Initial Assessment Questions 1. DESCRIPTION: "Describe how you are feeling."     No energy 2. SEVERITY: "How bad is it?"  "Can you stand and walk?"   - MILD - Feels weak or tired, but does not interfere with work, school or normal activities   - [place] to stand and walk; weakness interferes with work, school, or normal activities   - SEVERE - Unable to stand or walk     moderate 3. ONSET:  "When did the weakness begin?"     September 2019 4. CAUSE: "What do you think is causing the weakness?"     A blood problem; platelets are low normally 18-56; non-alcoholic cirrohosis of the liver 5. MEDICINES: "Have you recently started a new medicine or had a change in the amount of a medicine?"     no 6. OTHER SYMPTOMS: "Do you have any other symptoms?" (e.g., chest pain, fever, cough, SOB, vomiting, diarrhea, bleeding, other areas of pain)     no 7. PREGNANCY: "Is there any chance you are pregnant?" "When was your last menstrual period?"     no  Protocols used: WEAKNESS (GENERALIZED) AND  FATIGUE-A-AH

## 2018-09-04 NOTE — Patient Instructions (Signed)
It was good to see you today EKG does not show any acute change Please go to the lab on the way out today Your blood pressure shows orthostatic changes- BP dropping when you stand up Let's decrease your losartan and HCTZ by 1/2 (both) Please contact me with some BP reading in a few days- we may decrease more

## 2018-09-05 ENCOUNTER — Encounter: Payer: Self-pay | Admitting: Family Medicine

## 2018-09-05 DIAGNOSIS — E119 Type 2 diabetes mellitus without complications: Secondary | ICD-10-CM

## 2018-09-05 LAB — CBC
HCT: 34.3 % — ABNORMAL LOW (ref 36.0–46.0)
Hemoglobin: 11.1 g/dL — ABNORMAL LOW (ref 12.0–15.0)
MCHC: 32.5 g/dL (ref 30.0–36.0)
MCV: 79.2 fl (ref 78.0–100.0)
Platelets: 78 10*3/uL — ABNORMAL LOW (ref 150.0–400.0)
RBC: 4.33 Mil/uL (ref 3.87–5.11)
RDW: 17.4 % — ABNORMAL HIGH (ref 11.5–15.5)
WBC: 5.9 10*3/uL (ref 4.0–10.5)

## 2018-09-05 LAB — COMPREHENSIVE METABOLIC PANEL
ALT: 17 U/L (ref 0–35)
AST: 28 U/L (ref 0–37)
Albumin: 4.3 g/dL (ref 3.5–5.2)
Alkaline Phosphatase: 57 U/L (ref 39–117)
BUN: 14 mg/dL (ref 6–23)
CO2: 32 mEq/L (ref 19–32)
Calcium: 9.7 mg/dL (ref 8.4–10.5)
Chloride: 96 mEq/L (ref 96–112)
Creatinine, Ser: 0.67 mg/dL (ref 0.40–1.20)
GFR: 90.9 mL/min (ref >60.00)
Glucose, Bld: 224 mg/dL — ABNORMAL HIGH (ref 70–99)
Potassium: 3.4 mEq/L — ABNORMAL LOW (ref 3.5–5.1)
Sodium: 138 mEq/L (ref 135–145)
Total Bilirubin: 0.7 mg/dL (ref 0.2–1.2)
Total Protein: 7.2 g/dL (ref 6.0–8.3)

## 2018-09-05 LAB — HEMOGLOBIN A1C: Hgb A1c MFr Bld: 8.8 % — ABNORMAL HIGH (ref 4.6–6.5)

## 2018-09-06 MED ORDER — CANAGLIFLOZIN 300 MG PO TABS: 300.0000 mg | ORAL_TABLET | Freq: Every day | ORAL | 3 refills | Status: DC

## 2018-09-08 ENCOUNTER — Telehealth: Payer: Self-pay | Admitting: Family Medicine

## 2018-09-08 DIAGNOSIS — E119 Type 2 diabetes mellitus without complications: Secondary | ICD-10-CM

## 2018-09-08 MED ORDER — CANAGLIFLOZIN 300 MG PO TABS: 300.0000 mg | ORAL_TABLET | Freq: Every day | ORAL | 3 refills | Status: DC

## 2018-09-08 NOTE — Telephone Encounter (Signed)
Medication sent to correct pharmacy  

## 2018-09-08 NOTE — Telephone Encounter (Signed)
Copied from [place] [id number]. Topic: Quick Communication - See Telephone Encounter >> Sep 08, 2018 11:28 AM Burchel, Abbi R wrote: CRM for notification. See Telephone encounter for: 09/08/18. Pt was instructed to monitor and report BP readings:   09/06/2018 Sitting: 114/68 Standing: 120/80  09/07/2018 Sitting: 110/61 Standing: 110/66  09/08/2018 Sitting: 118/74 Standing: 122/89

## 2018-09-08 NOTE — Telephone Encounter (Signed)
Called her, she is taking 1/2 of her hctz and 1/2 of her lisinopril.  Her BP is still on the low side. She will try stopping the lisinopril totally and let me know how her BP responds

## 2018-09-08 NOTE — Telephone Encounter (Signed)
Copied from [place] [id number]. Topic: Quick Communication - Rx Refill/Question >> Sep 08, 2018 11:23 AM Burchel, Abbi R wrote: Medication: canagliflozin (INVOKANA) 300 MG TABS tablet  Pt states this medication was called in to the wrong pharmacy.  Please resend to:  [id number] [address], [address] AT [facility] OF [address] [place] [address] Phone: [phone] Fax: [phone]   Pt was  advised that RX refills may take up to 3 business days. We ask that you follow-up with your pharmacy.

## 2018-09-26 ENCOUNTER — Other Ambulatory Visit: Payer: Self-pay | Admitting: Family Medicine

## 2018-09-27 NOTE — Telephone Encounter (Signed)
Last seen here earlier this month   NCCSR:  08/16/2018  1   06/08/2018  Alprazolam 0.5 Mg Tablet  90.00 30 Je Cop  [id number]  Wal ([id number])  2/2 3.00 LME Comm Ins  [place]  07/13/2018  1   06/08/2018  Alprazolam 0.5 Mg Tablet  90.00 30 Je Cop  [id number]  Wal ([id number])  1/2 3.00 LME Comm Ins  [place]  06/08/2018  1   06/08/2018  Alprazolam 0.5 Mg Tablet  90.00 30 Je Cop  [id number]  Wal ([id number])  0/2 3.00 LME Comm Ins  [place]  05/03/2018  1   02/10/2018  Alprazolam 0.5 Mg Tablet  90.00 30 Je Cop  [id number]  Wal ([id number])  2/2 3.00 LME Comm Ins  [place]  03/24/2018  1   02/10/2018  Alprazolam 0.5 Mg Tablet  90.00 30 Je Cop  [id number]  Wal ([id number])  1/2 3.00 LME Comm Ins  [place]  02/10/2018  1   02/10/2018  Alprazolam 0.5 Mg Tablet  90.00 30 Je Cop  [id number]  Wal ([id number])  0/2 3.00 LME Comm Ins  [place]  01/05/2018  1   11/30/2017  Alprazolam 0.5 Mg Tablet  90.00 30 Ka Tab  [id number]  Wal ([id number])  1/1 3.00 LME Comm Ins  [place]  11/30/2017  1   11/30/2017  Alprazolam 0.5 Mg Tablet  90.00 30 Ka Tab  [id number]  Wal ([id number])  0/1 3.00 LME Comm Ins  [place]  10/19/2017  1   10/19/2017  Alprazolam 0.5 Mg Tablet  90.00 30 Ka Tab  [id number]  Wal ([id number])  0/0 3.00 LME Comm Ins  [place]  09/12/2017  1   09/12/2017  Alprazolam 0.5 Mg Tablet  90.00 30 Ka Tab  [id number]  Wal ([id number])  0/0 3.00 LME Comm Ins  [place]  08/01/2017  1   08/01/2017  Alprazolam 0.5 Mg Tablet  90.00 30 Ka Tab  [id number]  Wal ([id number])  0/0 3.00 LME Comm Ins  [place]  06/25/2017  1   05/19/2017  Alprazolam 0.5 Mg Tablet  90.00 30 Ka Tab  [id number]  Wal ([id number])  1/1 3.00 LME Comm Ins  [place]  05/19/2017  1   05/19/2017  Alprazolam 0.5 Mg Tablet  90.00 30 Ka Tab  [id number]  Wal ([id number])  0/1 3.00 LME Comm Ins  [place]  04/14/2017  1   04/14/2017  Alprazolam 0.5 Mg Tablet  90.00 30 Ka Tab  [id number]  Wal ([id number])  0/0 3.00 LME Comm Ins  [place]  04/05/2017  1   04/05/2017  Tramadol Hcl 50 Mg Tablet  30.00 4 Th Dix  [id number]  Wal ([id number])  0/0 37.50 MME Comm Ins  [place]  03/09/2017  1   12/27/2016  Alprazolam 0.5 Mg Tablet  90.00 30 Ka Tab  [id number]  Wal ([id number])  0/0 3.00 LME  Comm Ins  [place]  03/03/2017  1   03/03/2017  Tramadol Hcl 50 Mg Tablet  60.00 8 Th Dix  [id number]  Wal ([id number])  0/0 37.50 MME Comm Ins  [place]  02/21/2017  1   02/18/2017  Oxycodone-Acetaminophen 5-325  60.00 [address]  [id number]  Wal ([id number])  0/0 64.29 MME Comm Ins    02/01/2017  1   02/01/2017  Tramadol Hcl 50 Mg Tablet  30.00 [address]  [id number]  Wal ([id number])  0/0

## 2018-10-09 ENCOUNTER — Other Ambulatory Visit: Payer: Self-pay | Admitting: Family Medicine

## 2018-10-16 ENCOUNTER — Other Ambulatory Visit: Payer: Self-pay | Admitting: Family Medicine

## 2018-11-03 ENCOUNTER — Other Ambulatory Visit (HOSPITAL_BASED_OUTPATIENT_CLINIC_OR_DEPARTMENT_OTHER): Payer: PPO

## 2018-11-03 ENCOUNTER — Ambulatory Visit: Payer: PPO | Admitting: Hematology & Oncology

## 2018-11-03 ENCOUNTER — Other Ambulatory Visit: Payer: PPO

## 2018-11-28 ENCOUNTER — Ambulatory Visit (HOSPITAL_BASED_OUTPATIENT_CLINIC_OR_DEPARTMENT_OTHER)
Admission: RE | Admit: 2018-11-28 | Discharge: 2018-11-28 | Disposition: A | Discharge status: Home / Self Care | Payer: PPO | Source: Ambulatory Visit | Attending: Hematology & Oncology | Admitting: Hematology & Oncology

## 2018-11-28 ENCOUNTER — Other Ambulatory Visit: Payer: Self-pay

## 2018-11-28 DIAGNOSIS — K7581 Nonalcoholic steatohepatitis (NASH): Secondary | ICD-10-CM | POA: Diagnosis not present

## 2018-11-28 DIAGNOSIS — R161 Splenomegaly, not elsewhere classified: Secondary | ICD-10-CM | POA: Diagnosis not present

## 2018-11-29 ENCOUNTER — Telehealth: Payer: Self-pay | Admitting: Hematology & Oncology

## 2018-11-29 ENCOUNTER — Encounter: Payer: Self-pay | Admitting: Hematology & Oncology

## 2018-11-29 ENCOUNTER — Telehealth: Payer: Self-pay | Admitting: *Deleted

## 2018-11-29 ENCOUNTER — Inpatient Hospital Stay (HOSPITAL_BASED_OUTPATIENT_CLINIC_OR_DEPARTMENT_OTHER): Payer: PPO | Admitting: Hematology & Oncology

## 2018-11-29 ENCOUNTER — Inpatient Hospital Stay: Payer: PPO | Attending: Hematology & Oncology

## 2018-11-29 ENCOUNTER — Other Ambulatory Visit: Payer: Self-pay

## 2018-11-29 ENCOUNTER — Telehealth: Payer: Self-pay | Admitting: Family Medicine

## 2018-11-29 VITALS — BP 138/70 | HR 94 | Temp 98.2°F | Resp 20 | Wt 197.8 lb

## 2018-11-29 DIAGNOSIS — D500 Iron deficiency anemia secondary to blood loss (chronic): Secondary | ICD-10-CM

## 2018-11-29 DIAGNOSIS — Z7982 Long term (current) use of aspirin: Secondary | ICD-10-CM

## 2018-11-29 DIAGNOSIS — M7989 Other specified soft tissue disorders: Secondary | ICD-10-CM | POA: Insufficient documentation

## 2018-11-29 DIAGNOSIS — K5781 Diverticulitis of intestine, part unspecified, with perforation and abscess with bleeding: Secondary | ICD-10-CM | POA: Diagnosis not present

## 2018-11-29 DIAGNOSIS — D696 Thrombocytopenia, unspecified: Secondary | ICD-10-CM

## 2018-11-29 DIAGNOSIS — R161 Splenomegaly, not elsewhere classified: Secondary | ICD-10-CM | POA: Insufficient documentation

## 2018-11-29 DIAGNOSIS — E1165 Type 2 diabetes mellitus with hyperglycemia: Secondary | ICD-10-CM

## 2018-11-29 DIAGNOSIS — Z79899 Other long term (current) drug therapy: Secondary | ICD-10-CM | POA: Diagnosis not present

## 2018-11-29 DIAGNOSIS — Z7984 Long term (current) use of oral hypoglycemic drugs: Secondary | ICD-10-CM

## 2018-11-29 DIAGNOSIS — K7581 Nonalcoholic steatohepatitis (NASH): Secondary | ICD-10-CM | POA: Insufficient documentation

## 2018-11-29 LAB — CBC WITH DIFFERENTIAL (CANCER CENTER ONLY)
Abs Immature Granulocytes: 0.02 10*3/uL (ref 0.00–0.07)
Basophils Absolute: 0 10*3/uL (ref 0.0–0.1)
Basophils Relative: 1 %
Eosinophils Absolute: 0 10*3/uL (ref 0.0–0.5)
Eosinophils Relative: 1 %
HCT: 33.8 % — ABNORMAL LOW (ref 36.0–46.0)
Hemoglobin: 10.5 g/dL — ABNORMAL LOW (ref 12.0–15.0)
Immature Granulocytes: 0 %
Lymphocytes Relative: 25 %
Lymphs Abs: 1.3 10*3/uL (ref 0.7–4.0)
MCH: 24.9 pg — ABNORMAL LOW (ref 26.0–34.0)
MCHC: 31.1 g/dL (ref 30.0–36.0)
MCV: 80.1 fL (ref 80.0–100.0)
Monocytes Absolute: 0.4 10*3/uL (ref 0.1–1.0)
Monocytes Relative: 7 %
Neutro Abs: 3.3 10*3/uL (ref 1.7–7.7)
Neutrophils Relative %: 66 %
Platelet Count: 79 10*3/uL — ABNORMAL LOW (ref 150–400)
RBC: 4.22 MIL/uL (ref 3.87–5.11)
RDW: 16.5 % — ABNORMAL HIGH (ref 11.5–15.5)
WBC Count: 5 10*3/uL (ref 4.0–10.5)
nRBC: 0 % (ref 0.0–0.2)

## 2018-11-29 LAB — IRON AND TIBC
Iron: 42 ug/dL (ref 41–142)
Saturation Ratios: 8 % — ABNORMAL LOW (ref 21–57)
TIBC: 510 ug/dL — ABNORMAL HIGH (ref 236–444)
UIBC: 468 ug/dL — ABNORMAL HIGH (ref 120–384)

## 2018-11-29 LAB — CMP (CANCER CENTER ONLY)
ALT: 15 U/L (ref 0–44)
AST: 24 U/L (ref 15–41)
Albumin: 4.6 g/dL (ref 3.5–5.0)
Alkaline Phosphatase: 55 U/L (ref 38–126)
Anion gap: 9 (ref 5–15)
BUN: 13 mg/dL (ref 8–23)
CO2: 34 mmol/L — ABNORMAL HIGH (ref 22–32)
Calcium: 9.5 mg/dL (ref 8.9–10.3)
Chloride: 98 mmol/L (ref 98–111)
Creatinine: 0.62 mg/dL (ref 0.44–1.00)
GFR, Est AFR Am: >60 mL/min (ref >60)
GFR, Estimated: >60 mL/min (ref >60)
Glucose, Bld: 246 mg/dL — ABNORMAL HIGH (ref 70–99)
Potassium: 3.6 mmol/L (ref 3.5–5.1)
Sodium: 141 mmol/L (ref 135–145)
Total Bilirubin: 0.8 mg/dL (ref 0.3–1.2)
Total Protein: 7.7 g/dL (ref 6.5–8.1)

## 2018-11-29 LAB — FERRITIN: Ferritin: 8 ng/mL — ABNORMAL LOW (ref 11–307)

## 2018-11-29 LAB — PLATELET BY CITRATE

## 2018-11-29 NOTE — Telephone Encounter (Signed)
Pt dropped off document to be filled out by provider (Disability Parking Placard - 1 page) Pt would like to have document mailed to home address on file when ready. Document put at front office tray under providers name.

## 2018-11-29 NOTE — Progress Notes (Signed)
[place] at [facility] [address] [place], [place] [address] [phone]  Date:  11/30/2018   Name:  Rachael Buck   DOB:  Jun 07, 1941   MRN:  [id number]  PCP:  [name], MD    Chief Complaint: No chief complaint on file.   History of Present Illness:  Rachael Buck is a 78 y.o. very pleasant female patient who presents with the following:  Virtual visit today due to COVID-19 outbreak-we ended up having to convert to a phone visit, patient was not able to set up WebEx at home Patient identity confirmed by name and date of birth History of diabetes, hyperlipidemia, hypertension/orthostatic hypotension, liver cirrhosis I last saw her in January after she had a fall.  She is also under more stress due to her husband's Alzheimer's disease diagnosis  Lab Results  Component Value Date   HGBA1C 8.8 (H) 09/04/2018   Her most recent A1c was too high, as above.  I had her increase Invokana from 100 to to 300 mg She saw hematologist, Dr. 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  She is really not able to tolerate metformin-try to take it again a few months ago and had bad diarrhea.  I assured her that she does not take this medication if she cannot tolerate it.  We will add to her allergy list as an intolerance Current diabetes medications include: Glipizide XL- however she stopped taking this for some reason, a couple of months ago.  I had asked her to stop Januvia and I think she got confused invokana 300 -she is still taking this  She is checking her glucose at home in the am Early am sugars are 140- 170 in the am, but can get up higher in the afternoon She is coming in a week from today for an iron infusion- she can do an A1c at that time if need be  She also stopped her  losartan in January, again she thought she was supposed to. However her BP looked okay without this medication yesterday.   BP Readings from Last 3 Encounters:  11/29/18 138/70  07/06/18 126/66  05/12/18 (!) 122/58    Patient Active Problem List   Diagnosis Date Noted  . Cirrhosis of liver ([place]) 02/17/2018  . 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 Thrombocytopenia ([place]) 02/15/2017  . Varicose veins of left lower extremity with complications 59/16/3846  . Age-related cognitive decline 06/02/2015  . Fatigue 07/19/2014  . Orthostatic hypotension 05/14/2013  . Bronchitis with asthma, acute 01/29/2013  . Nail abnormality 09/04/2012  . Bleeding disorder ([place]) 05/08/2012  . Varices, esophageal ([place]) 03/21/2012  . NASH (nonalcoholic steatohepatitis) 03/21/2012  . HTN (hypertension) 12/21/2011  . Depression 03/28/2011  . IBS (irritable bowel syndrome) 03/23/2011  . INTRINSIC ASTHMA, UNSPECIFIED 08/27/2010  . DYSPNEA ON EXERTION 07/27/2010  . Uncontrolled diabetes mellitus ([place]) 07/24/2010  . MIXED HYPERLIPIDEMIA 07/24/2010  . OBESITY, UNSPECIFIED 07/24/2010  . ALLERGIC RHINITIS CAUSE UNSPECIFIED 07/24/2010  . REFLUX ESOPHAGITIS 07/24/2010  . PAIN IN JOINT PELVIC REGION AND THIGH 07/24/2010    Past Medical History:  Diagnosis Date  . Anxiety   . Arthritis   . Asthma   . Cirrhosis, nonalcoholic ([place])   . Colon polyps   . Diabetes mellitus   . Diverticulitis   .  GERD (gastroesophageal reflux disease)   . Hyperlipidemia   . Hypertension   . NASH (nonalcoholic steatohepatitis)   . Obesity   . Pneumonia     Past Surgical History:  Procedure Laterality Date  . CHOLECYSTECTOMY  1981  . ENDOVENOUS ABLATION SAPHENOUS VEIN W/ LASER Left 02/21/2018   endovenous laser ablation left greater saphenous vein by Tinnie Gens MD   . [place]  . PARTIAL HYSTERECTOMY  1972  . ROTATOR CUFF REPAIR     bilateral. 2006, 2008  . TOTAL KNEE ARTHROPLASTY Left 02/18/2017   . TOTAL KNEE ARTHROPLASTY Left 02/18/2017   Procedure: LEFT TOTAL KNEE ARTHROPLASTY;  Surgeon: Netta Cedars, MD;  Location: [facility];  Service: Orthopedics;  Laterality: Left;  Marland Kitchen VEIN SURGERY      Social History   Tobacco Use  . Smoking status: Former Research scientist (life sciences)  . Smokeless tobacco: Never Used  Substance Use Topics  . Alcohol use: No  . Drug use: No    Family History  Problem Relation Age of Onset  . Breast cancer Daughter   . Breast cancer Maternal Aunt   . Diabetes Brother   . Diabetes Sister   . Stomach cancer Mother   . Cancer Other   . GER disease Other   . Obesity Other     Allergies  Allergen Reactions  . Codeine Nausea And Vomiting  . Shrimp [Shellfish Allergy] Swelling    Medication list has been reviewed and updated.  Current Outpatient Medications on File Prior to Visit  Medication Sig Dispense Refill  . albuterol (PROAIR HFA) 108 (90 Base) MCG/ACT inhaler Inhale 2 puffs into the lungs every 4 (four) hours as needed for wheezing or shortness of breath. 1 Inhaler 6  . ALPRAZolam (XANAX) 0.5 MG tablet TAKE 1 TABLET BY MOUTH THREE TIMES DAILY 90 tablet 2  . aspirin (ASPIRIN CHILDRENS) 81 MG chewable tablet Chew 1 tablet (81 mg total) by mouth 2 (two) times daily. 60 tablet 0  . canagliflozin (INVOKANA) 300 MG TABS tablet Take 1 tablet (300 mg total) by mouth daily before breakfast. 90 tablet 3  . colestipol (COLESTID) 1 g tablet Take 1 g by mouth daily.    Marland Kitchen dicyclomine (BENTYL) 20 MG tablet TAKE 1 TABLET BY MOUTH THREE TIMES DAILY AS NEEDED 360 tablet 0  . diphenhydrAMINE (BENADRYL) 25 MG tablet Take 25 mg by mouth every 6 (six) hours as needed (for allergies.).    Marland Kitchen fenofibrate 160 MG tablet Take 1 tablet (160 mg total) by mouth daily. 90 tablet 1  . ferrous sulfate 325 (65 FE) MG EC tablet Take 325 mg by mouth daily.    Marland Kitchen glipiZIDE (GLUCOTROL XL) 5 MG 24 hr tablet Take 1 tablet (5 mg total) by mouth daily with breakfast. 90 tablet 1  . glucose blood (ONE TOUCH  ULTRA TEST) test strip TEST BLOOD SUGARS ONCE DAILY IN THE MORNING 100 each 12  . hydrochlorothiazide (HYDRODIURIL) 25 MG tablet TAKE 1 TABLET BY MOUTH EVERY DAY 90 tablet 1  . hydrocortisone 2.5 % cream Apply topically.    Marland Kitchen ipratropium-albuterol (DUONEB) 0.5-2.5 (3) MG/3ML SOLN Take 3 mLs by nebulization every 6 (six) hours as needed. 360 mL 2  . loperamide (IMODIUM) 2 MG capsule Take 4-6 mg by mouth daily as needed (for IBS symptoms).    . losartan (COZAAR) 50 MG tablet Take 1 tablet (50 mg total) by mouth daily. 90 tablet 1  . Mometasone Furoate (ASMANEX HFA) 100 MCG/ACT AERO Inhale 2 puffs  into the lungs 2 (two) times daily. 1 Inhaler 12  . omeprazole (PRILOSEC) 40 MG capsule TAKE 1 CAPSULE(40 MG) BY MOUTH DAILY 90 capsule 1  . ONETOUCH DELICA LANCETS FINE MISC 1 each by Other route daily. 100 each 12  . polyethylene glycol powder (GLYCOLAX/MIRALAX) powder Take 17 g by mouth daily. 255 g 0  . sertraline (ZOLOFT) 50 MG tablet TAKE 1 TABLET BY MOUTH EVERY DAY 90 tablet 1  . simvastatin (ZOCOR) 80 MG tablet TAKE 1 TABLET BY MOUTH EVERY DAY 90 tablet 1   No current facility-administered medications on file prior to visit.     Review of Systems:  As per HPI- otherwise negative. She notes no fever cough, no sign of current illness  Physical Examination: There were no vitals filed for this visit. There were no vitals filed for this visit. There is no height or weight on file to calculate BMI. Ideal Body Weight:    Patient sounds well over the phone, her normal self.  No wheezing or tachypnea is noted, no cough  Assessment and Plan: Controlled type 2 diabetes mellitus without complication, without long-term current use of insulin ([place]) - Plan: glipiZIDE (GLUCOTROL XL) 5 MG 24 hr tablet  Phone conversation today about her diabetes.  Her most recent A1c in January was close to 9%.  I had her increase Invokana at that time, but she also mistakenly stopped her glipizide.  Sugar at hematology  yesterday was almost 250.  We will have her restart glipizide, and have tried to add an A1c to her labs that were done yesterday.  If this is not possible she will be in the next week, and we can draw her blood done.  I have cautioned her to watch for low sugars with the addition of glipizide  She also stopped taking her losartan, but blood pressure looked okay yesterday.  Her blood pressure will be checked again next week, so we can get more data  A summary of her visit and recommendations will be sent to her MyChart account  Signed Lamar Blinks, MD

## 2018-11-29 NOTE — Progress Notes (Signed)
Hematology and Oncology Follow Up Visit  QUINTINA HAKEEM [id number] March 21, 1941 78 y.o. 11/29/2018   Principle Diagnosis:   Thrombocytopenia -- NASH/Splenomegaly ; Immune based thrombocytopenia  Current Therapy:    Nplate q week -- surgery on 02/21/2018     Interim History:  Ms. Dantes is back for follow-up.  We last saw her back in November.  Since then, she really has had no significant problems although she has swelling with her left knee.  This is been going on since she had her knee surgery.  We did do an ultrasound of her abdomen.  This was done last week.  The ultrasound showed that her splenomegaly was no worse.  Her splenic volume was 1259 cm.  Clearly, her hepatic issues are going to be from her diabetes.  Her blood sugar today was 246.  She has had no bleeding.  There is been no change in bowel or bladder habits.  She has had no nausea or vomiting.  There is been no rashes.  Overall, her performance status is ECOG 1-2.    Medications:  Current Outpatient Medications:  .  albuterol (PROAIR HFA) 108 (90 Base) MCG/ACT inhaler, Inhale 2 puffs into the lungs every 4 (four) hours as needed for wheezing or shortness of breath., Disp: 1 Inhaler, Rfl: 6 .  ALPRAZolam (XANAX) 0.5 MG tablet, TAKE 1 TABLET BY MOUTH THREE TIMES DAILY, Disp: 90 tablet, Rfl: 2 .  canagliflozin (INVOKANA) 300 MG TABS tablet, Take 1 tablet (300 mg total) by mouth daily before breakfast., Disp: 90 tablet, Rfl: 3 .  colestipol (COLESTID) 1 g tablet, Take 1 g by mouth daily., Disp: , Rfl:  .  dicyclomine (BENTYL) 20 MG tablet, TAKE 1 TABLET BY MOUTH THREE TIMES DAILY AS NEEDED, Disp: 360 tablet, Rfl: 0 .  diphenhydrAMINE (BENADRYL) 25 MG tablet, Take 25 mg by mouth every 6 (six) hours as needed (for allergies.)., Disp: , Rfl:  .  glucose blood (ONE TOUCH ULTRA TEST) test strip, TEST BLOOD SUGARS ONCE DAILY IN THE MORNING, Disp: 100 each, Rfl: 12 .  hydrochlorothiazide (HYDRODIURIL) 25 MG tablet, TAKE 1  TABLET BY MOUTH EVERY DAY, Disp: 90 tablet, Rfl: 1 .  hydrocortisone 2.5 % cream, Apply topically., Disp: , Rfl:  .  ipratropium-albuterol (DUONEB) 0.5-2.5 (3) MG/3ML SOLN, Take 3 mLs by nebulization every 6 (six) hours as needed., Disp: 360 mL, Rfl: 2 .  loperamide (IMODIUM) 2 MG capsule, Take 4-6 mg by mouth daily as needed (for IBS symptoms)., Disp: , Rfl:  .  Mometasone Furoate (ASMANEX HFA) 100 MCG/ACT AERO, Inhale 2 puffs into the lungs 2 (two) times daily., Disp: 1 Inhaler, Rfl: 12 .  omeprazole (PRILOSEC) 40 MG capsule, TAKE 1 CAPSULE(40 MG) BY MOUTH DAILY, Disp: 90 capsule, Rfl: 1 .  ONETOUCH DELICA LANCETS FINE MISC, 1 each by Other route daily., Disp: 100 each, Rfl: 12 .  sertraline (ZOLOFT) 50 MG tablet, TAKE 1 TABLET BY MOUTH EVERY DAY, Disp: 90 tablet, Rfl: 1 .  simvastatin (ZOCOR) 80 MG tablet, TAKE 1 TABLET BY MOUTH EVERY DAY, Disp: 90 tablet, Rfl: 1 .  aspirin (ASPIRIN CHILDRENS) 81 MG chewable tablet, Chew 1 tablet (81 mg total) by mouth 2 (two) times daily., Disp: 60 tablet, Rfl: 0 .  fenofibrate 160 MG tablet, Take 1 tablet (160 mg total) by mouth daily., Disp: 90 tablet, Rfl: 1 .  ferrous sulfate 325 (65 FE) MG EC tablet, Take 325 mg by mouth daily., Disp: , Rfl:  .  glipiZIDE (  GLUCOTROL XL) 5 MG 24 hr tablet, Take 1 tablet (5 mg total) by mouth daily with breakfast., Disp: 90 tablet, Rfl: 1 .  losartan (COZAAR) 50 MG tablet, Take 1 tablet (50 mg total) by mouth daily., Disp: 90 tablet, Rfl: 1 .  polyethylene glycol powder (GLYCOLAX/MIRALAX) powder, Take 17 g by mouth daily., Disp: 255 g, Rfl: 0  Allergies:  Allergies  Allergen Reactions  . Codeine Nausea And Vomiting  . Shrimp [Shellfish Allergy] Swelling    Past Medical History, Surgical history, Social history, and Family History were reviewed and updated.  Review of Systems: Review of Systems  Constitutional: Negative.   HENT:  Negative.   Eyes: Negative.   Respiratory: Positive for cough and shortness of  breath.   Cardiovascular: Negative.   Gastrointestinal: Negative.   Endocrine: Negative.   Genitourinary: Negative.    Musculoskeletal: Positive for myalgias.  Skin: Negative.   Neurological: Negative.   Hematological: Negative.   Psychiatric/Behavioral: Negative.     Physical Exam:  weight is 197 lb 12.8 oz (89.7 kg). Her oral temperature is 98.2 F (36.8 C). Her blood pressure is 138/70 and her pulse is 94. Her respiration is 20 and oxygen saturation is 95%.   Wt Readings from Last 3 Encounters:  11/29/18 197 lb 12.8 oz (89.7 kg)  09/04/18 205 lb (93 kg)  07/06/18 205 lb (93 kg)    Physical Exam Vitals signs reviewed.  HENT:     Head: Normocephalic and atraumatic.  Eyes:     Pupils: Pupils are equal, round, and reactive to light.  Neck:     Musculoskeletal: Normal range of motion.  Cardiovascular:     Rate and Rhythm: Normal rate and regular rhythm.     Heart sounds: Normal heart sounds.  Pulmonary:     Effort: Pulmonary effort is normal.     Breath sounds: Normal breath sounds.  Abdominal:     General: Bowel sounds are normal.     Palpations: Abdomen is soft.  Musculoskeletal: Normal range of motion.        General: No tenderness or deformity.  Lymphadenopathy:     Cervical: No cervical adenopathy.  Skin:    General: Skin is warm and dry.     Findings: No erythema or rash.  Neurological:     Mental Status: She is alert and oriented to person, place, and time.  Psychiatric:        Behavior: Behavior normal.        Thought Content: Thought content normal.        Judgment: Judgment normal.      Lab Results  Component Value Date   WBC 5.0 11/29/2018   HGB 10.5 (L) 11/29/2018   HCT 33.8 (L) 11/29/2018   MCV 80.1 11/29/2018   PLT 79 (L) 11/29/2018     Chemistry      Component Value Date/Time   NA 138 09/04/2018 1534   NA 127 (L) 02/14/2017 1335   K 3.4 (L) 09/04/2018 1534   K 3.6 02/14/2017 1335   CL 96 09/04/2018 1534   CL 92 (L) 02/14/2017 1335    CO2 32 09/04/2018 1534   CO2 28 02/14/2017 1335   BUN 14 09/04/2018 1534   BUN 7 (L) 02/14/2017 1335   CREATININE 0.67 09/04/2018 1534   CREATININE 0.80 07/06/2018 0932   CREATININE 0.47 (L) 02/14/2017 1335   CREATININE 0.56 07/24/2014 1204      Component Value Date/Time   CALCIUM 9.7 09/04/2018 1534  CALCIUM 9.3 02/14/2017 1335   ALKPHOS 57 09/04/2018 1534   ALKPHOS 68 02/14/2017 1335   AST 28 09/04/2018 1534   AST 37 07/06/2018 0932   ALT 17 09/04/2018 1534   ALT 29 07/06/2018 0932   BILITOT 0.7 09/04/2018 1534   BILITOT 1.0 07/06/2018 0932         Impression and Plan: Ms. Waiters is a 78 year old white female.  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  Thankfully, her platelet count is holding steady.  I suspect that she probably is iron deficient.  She does feel little bit tired.  I told her that if her iron is quite low, we can give her some IV iron and this probably will make her feel better.  She will think about this.  Otherwise, we will get her back in 6 months.  I think this is reasonable.  If she needs to come back sooner to see [place], we can get her in.     [name], MD 4/1/20208:07 AM

## 2018-11-29 NOTE — Telephone Encounter (Signed)
-----   Message from [name], MD sent at 11/29/2018  1:20 PM EDT ----- Call - the iron is very low!!!  Let's try 1 dose of Feraheme and see if this makes you feel better!!  Please set up for next week!!  Laurey Arrow

## 2018-11-29 NOTE — Telephone Encounter (Signed)
Appointments scheduled patient notified date/time per 4/1 staff message

## 2018-11-29 NOTE — Telephone Encounter (Signed)
Notified pt of results.message to scheduling

## 2018-11-30 ENCOUNTER — Ambulatory Visit (INDEPENDENT_AMBULATORY_CARE_PROVIDER_SITE_OTHER): Payer: PPO | Admitting: Family Medicine

## 2018-11-30 ENCOUNTER — Encounter: Payer: Self-pay | Admitting: Vascular Surgery

## 2018-11-30 ENCOUNTER — Encounter: Payer: Self-pay | Admitting: Family Medicine

## 2018-11-30 DIAGNOSIS — E119 Type 2 diabetes mellitus without complications: Secondary | ICD-10-CM

## 2018-11-30 MED ORDER — GLIPIZIDE ER 5 MG PO TB24: 5.0000 mg | ORAL_TABLET | Freq: Every day | ORAL | 3 refills | Status: DC

## 2018-11-30 NOTE — Patient Instructions (Addendum)
It was very nice to speak with you today.  As we discussed, we will have you go back on your glipizide 5 mg for blood sugar control.  Please do be alert for any low blood sugars especially when you for start back on this  I called the hospital lab and I DO think they would be able to add an A1c to your blood from yesterday.  I will be in touch with this report ASAP.  However, if you do not hear about this result it probably means they could not add it.  In that case we can plan to have your blood drawn when you are in next week to see Dr. Marin Olp  Please also take noted your blood pressure when you are in next week, since you are no longer taking losartan.  Her blood pressure goal is less than about 130/85, on average  Please let me know if any questions or concerns.  Let us plan to visit in the office in about 3 months, assuming things are back to normal by then  I do have that you are due for a mammogram- this is not an emergency, but please do try to catch up on it when it is safe to go out We also need to do a foot exam our next visit  Take care [place]

## 2018-12-05 ENCOUNTER — Other Ambulatory Visit: Payer: Self-pay | Admitting: Family Medicine

## 2018-12-07 ENCOUNTER — Other Ambulatory Visit: Payer: Self-pay

## 2018-12-07 ENCOUNTER — Inpatient Hospital Stay: Payer: PPO

## 2018-12-07 VITALS — BP 124/60 | HR 78

## 2018-12-07 DIAGNOSIS — D696 Thrombocytopenia, unspecified: Secondary | ICD-10-CM | POA: Diagnosis not present

## 2018-12-07 MED ORDER — SODIUM CHLORIDE 0.9 % IV SOLN
510.0000 mg | Freq: Once | INTRAVENOUS | Status: AC
  Administered 2018-12-07: 510 mg via INTRAVENOUS
  Filled 2018-12-07: qty 17

## 2018-12-07 MED ORDER — SODIUM CHLORIDE 0.9 % IV SOLN
Freq: Once | INTRAVENOUS | Status: AC
  Administered 2018-12-07: 13:00:00 via INTRAVENOUS
  Filled 2018-12-07: qty 250

## 2018-12-07 NOTE — Patient Instructions (Signed)

## 2018-12-21 ENCOUNTER — Other Ambulatory Visit: Payer: Self-pay

## 2018-12-22 ENCOUNTER — Other Ambulatory Visit: Payer: Self-pay

## 2018-12-22 NOTE — Patient Outreach (Signed)
[place] [facility]) Care Management  12/22/2018  Rachael Buck 1941/05/18 [id number]   Telephone Screen  Referral Date: 12/21/2018 Referral Source: EMMI-Prevent Referral Reason: " Patient engagement sore of 9, HTN,DM" Insurance: HTA   Outreach attempt # 1 to patient. No answer at present and unable to leave message.     Plan: RN CM will make outreach attempt to patient within 3-4 business days. RN CM will send unsuccessful outreach letter to patient.    [name], RN,BSN,CCM [place] Management Telephonic Care Management Coordinator Direct Phone: [phone] Toll Free: [phone] Fax: [phone]

## 2018-12-25 ENCOUNTER — Other Ambulatory Visit: Payer: Self-pay

## 2018-12-25 NOTE — Patient Outreach (Signed)
[facility] [facility]) Care Management  12/25/2018  RAKEB KIBBLE 12/17/1940 [id number]   Telephone Screen  Referral Date: 12/21/2018  Referral Source: EMMI-Prevent  Referral Reason: " Patient engagement sore of 9, HTN,DM"  Insurance: HTA   Outreach attempt #2 to patient. A female answered the phone and reported that patient was not available at present.    Plan: RN CM will make outreach attempt to patient within 3-4 business days.   [name], RN,BSN,CCM [place] Management Telephonic Care Management Coordinator Direct Phone: [phone] Toll Free: [phone] Fax: [phone]

## 2018-12-27 ENCOUNTER — Other Ambulatory Visit: Payer: Self-pay

## 2018-12-27 NOTE — Patient Outreach (Signed)
[facility] Care Management  12/27/2018  KALLYN DEMARCUS 08-03-1941 [id number]   Telephone Screen  Referral Date: 12/21/2018  Referral Source: EMMI-Prevent  Referral Reason: " Patient engagement sore of 9, HTN,DM"  Insurance: HTA   Outreach attempt #3 to patient. No answer at present and unable to leave message.       Plan: RN CM will close case if no response from letter mailed to patient.   [name], RN,BSN,CCM [place] Management Telephonic Care Management Coordinator Direct Phone: [phone] Toll Free: [phone] Fax: [phone]

## 2019-01-08 ENCOUNTER — Other Ambulatory Visit: Payer: Self-pay

## 2019-01-08 NOTE — Patient Outreach (Signed)
[place] [facility]) Care Management  01/08/2019  Rachael Buck 22-Aug-1941 [id number]   Telephone Screen  Referral Date: 12/21/2018  Referral Source: EMMI-Prevent  Referral Reason: " Patient engagement sore of 9, HTN,DM"  Insurance: HTA   Multiple attempts to establish contact with patient without success. No response from letter mailed to patient. Case is being closed at this time.     Plan: RN CM will close case at this time.  [name], RN,BSN,CCM [place] Management Telephonic Care Management Coordinator Direct Phone: [phone] Toll Free: [phone] Fax: [phone]

## 2019-01-09 ENCOUNTER — Other Ambulatory Visit: Payer: Self-pay | Admitting: Family Medicine

## 2019-01-09 NOTE — Telephone Encounter (Signed)
Requesting:Alprazolam Contract:2014, NEEDS CSC LXB:WIOM, needs uds Last Visit:11/30/2018 Next Visit:none with pcp Last Refill:129/2020 2 refills  Please Advise

## 2019-01-11 ENCOUNTER — Other Ambulatory Visit: Payer: Self-pay | Admitting: Family Medicine

## 2019-01-26 ENCOUNTER — Other Ambulatory Visit: Payer: Self-pay | Admitting: Family Medicine

## 2019-02-05 DIAGNOSIS — M25562 Pain in left knee: Secondary | ICD-10-CM | POA: Diagnosis not present

## 2019-02-05 DIAGNOSIS — Z96652 Presence of left artificial knee joint: Secondary | ICD-10-CM | POA: Diagnosis not present

## 2019-03-14 ENCOUNTER — Other Ambulatory Visit: Payer: Self-pay | Admitting: Pharmacist

## 2019-03-14 NOTE — Patient Outreach (Signed)
[place] [facility]) Care Management  [place]   03/14/2019  NALLA PURDY [birth year] [id number]  Reason for referral: medication assistance  Referral source: MD Referral medication(s): Invokana Current insurance:HTA   HPI:  78 year old female with multiple medical conditions including but not limited to:  Allergic rhinintis, cirrhosis of the liver, depression, IBS,  Obesity, hyperlipidemia, type 2 diabetes.     Objective: Allergies  Allergen Reactions  . Codeine Nausea And Vomiting  . Shrimp [Shellfish Allergy] Swelling  . Metformin And Related     Significant diarrhea     Medications Reviewed Today    Reviewed by Elayne Guerin, [facility] (Pharmacist) on 03/14/19 at 1600  Med List Status: <None>  Medication Order Taking? Sig Documenting Provider Last Dose Status Informant  albuterol (PROAIR HFA) 108 (90 Base) MCG/ACT inhaler [id number] Yes Inhale 2 puffs into the lungs every 4 (four) hours as needed for wheezing or shortness of breath. Midge Minium, MD Taking Active Self  ALPRAZolam Duanne Moron) 0.5 MG tablet [id number] Yes TAKE 1 TABLET BY MOUTH THREE TIMES DAILY Copland, Gay Filler, MD Taking Active   canagliflozin (INVOKANA) 300 MG TABS tablet [id number] Yes Take 1 tablet (300 mg total) by mouth daily before breakfast. Copland, Gay Filler, MD Taking Active   colestipol (COLESTID) 1 g tablet [id number] Yes Take 1 g by mouth daily. [provider] Taking Active Self  dicyclomine (BENTYL) 20 MG tablet [id number] Yes TAKE 1 TABLET BY MOUTH THREE TIMES DAILY AS NEEDED Copland, Gay Filler, MD Taking Active   diphenhydrAMINE (BENADRYL) 25 MG tablet [id number] Yes Take 25 mg by mouth every 6 (six) hours as needed (for allergies.). [provider] Taking Active Self       Patient not taking:      Discontinued 03/14/19 1600 (Change in therapy)   glipiZIDE (GLUCOTROL XL) 5 MG 24 hr tablet [id number] Yes Take 1 tablet (5 mg total) by mouth daily with  breakfast. Copland, Gay Filler, MD Taking Active   glucose blood (ONE TOUCH ULTRA TEST) test strip [id number] Yes TEST BLOOD SUGARS ONCE DAILY IN THE MORNING Copland, Gay Filler, MD Taking Active   hydrochlorothiazide (HYDRODIURIL) 25 MG tablet [id number] Yes TAKE 1 TABLET BY MOUTH EVERY DAY Copland, Gay Filler, MD Taking Active   hydrocortisone 2.5 % cream [id number] Yes Apply topically. [provider] Taking Active   ipratropium-albuterol (DUONEB) 0.5-2.5 (3) MG/3ML SOLN [id number] Yes Take 3 mLs by nebulization every 6 (six) hours as needed. Copland, Gay Filler, MD Taking Active   loperamide (IMODIUM) 2 MG capsule [id number] Yes Take 4-6 mg by mouth daily as needed (for IBS symptoms). [provider] Taking Active Self  Mometasone Furoate [facility] HFA) 100 MCG/ACT AERO [id number] Yes Inhale 2 puffs into the lungs 2 (two) times daily. Copland, Gay Filler, MD Taking Active   omeprazole (PRILOSEC) 40 MG capsule [id number] Yes TAKE 1 CAPSULE(40 MG) BY MOUTH DAILY Copland, Gay Filler, MD Taking Active   [facility] LANCETS [place] [id number] Yes 1 each by Other route daily. Copland, Gay Filler, MD Taking Active   sertraline (ZOLOFT) 50 MG tablet [id number] Yes TAKE 1 TABLET BY MOUTH EVERY DAY Copland, Gay Filler, MD Taking Active   simvastatin (ZOCOR) 80 MG tablet [id number] Yes TAKE 1 TABLET BY MOUTH EVERY DAY Copland, Gay Filler, MD Taking Active           Assessment:  Drugs sorted by system:  Neurologic/Psychologic: Alprazolam, Sertraline  Cardiovascular: HCTZ, Simvastatin  Pulmonary/Allergy: Asmanex, Albuterol  HFA, Diphenhydramine, ipatropium-albuterol  Gastrointestinal: Omeprazole, Colestipol, Dicyclomine, Immodium  Endocrine: Invokana, Glipizide  Topical: Hydrocortisone Cream   Vitamins/Minerals/Supplements: Ferrous Sulfate  Medication Review Findings:  HgA1c-8.8% 09/04/2018 (Invocana was started 09/08/2018)-patient had a new HgA1c lab order but due to [place] did not  have the lab drawn  On Statin   Medication Assistance Findings:  Medication assistance needs identified: Invokana ([place]), Asmanex and Proventil HFA (Merck)  Patient is over income for Extra Help/LIS She has not spent the required 4% of her annual household income in medication expenses as is required by Wynetta Emery and [employer] for [employer]. (Approx $1500) Per HTA patient has spent $431   Jardiance could be substituted for Invokana if deemed therapeutically appropriate by the patient's provider. Vania Rea is available from [facility] patient assistance program at no cost.   Additional medication assistance options reviewed with patient as warranted:  No other options identified  Plan: I will route note to Dr. Janett Billow Copland about switching to Jardiance. Follow up in 2-3 business days on the substitution. Mail patient applications after I hear back from Dr. Lorelei Pont.  Elayne Guerin, PharmD, [place] Clinical Pharmacist [phone]

## 2019-03-19 ENCOUNTER — Other Ambulatory Visit: Payer: Self-pay | Admitting: Family Medicine

## 2019-03-19 ENCOUNTER — Encounter: Payer: Self-pay | Admitting: Family Medicine

## 2019-03-19 MED ORDER — EMPAGLIFLOZIN 10 MG PO TABS: 10.0000 mg | ORAL_TABLET | Freq: Every day | ORAL | 3 refills | Status: DC

## 2019-03-20 ENCOUNTER — Ambulatory Visit: Payer: Self-pay | Admitting: Pharmacist

## 2019-03-20 ENCOUNTER — Other Ambulatory Visit: Payer: Self-pay | Admitting: Pharmacy Technician

## 2019-03-20 ENCOUNTER — Telehealth: Payer: Self-pay | Admitting: Pharmacist

## 2019-03-20 NOTE — Patient Outreach (Signed)
[place] [facility]) Care Management  03/20/2019  LARIYAH SHETTERLY 20-Nov-1940 [id number]                          Medication Assistance Referral  Referral From: [place]  Medication/Company: Asmanex HFA and Proventil HFA / [employer]ck Patient application portion:  Education officer, museum portion: Interoffice Mailed to Dr. Janett Billow Copland  Medication/Company: Vania Rea / BI Patient application portion:  Mailed Provider application portion: Faxed  to Dr. Janett Billow Copland  Follow up:  Will follow up with patient in 5-10 business days to confirm application(s) have been received.  Kalai Baca P. Ahlia Lemanski, [place] Management [phone]

## 2019-03-20 NOTE — Patient Outreach (Signed)
[name]) Care Management  03/20/2019  Rachael Buck August 23, 1941 [id number]   Received an inbox message back from Dr. Lorelei Pont stating she was in agreement with the therapeutic switch from Invokana to [name].   Patient was called and the substitution was discussed.    Patient will be sent applications for Proventil, Asmanex, and Jardiance.  Plan: Route note to Byron Simcox to send applications to patient.  Follow up on med assistance process in 6-8 weeks.  Patient will be followed by Susy Frizzle for medication management.   Elayne Guerin, PharmD, [place] Clinical Pharmacist [phone]

## 2019-03-20 NOTE — Telephone Encounter (Signed)
-----   Message from [name], MD sent at 03/19/2019  5:27 AM EDT ----- Madaline Brilliant, this is done.  I started with 10 mg of Jardiance- can change to 25 mg however if appropriate  JC ----- Message ----- From: Elayne Guerin, [facility] Sent: 03/15/2019   1:13 PM EDT To: [name], MD  Are you ok with the substitution? Jardiance could be substituted for Invokana if deemed therapeutically appropriate by the patient's provider. Vania Rea is available from [facility] patient assistance program at no cost.  If you are ok with the switch, please just add jardiance to the patient's medication list (do not send a prescription to the pharmacy) and then sign the forms when we send them to you.  Blessings,  Elayne Guerin, PharmD, [place] Clinical Pharmacist [phone]    ----- Message ----- From: [name], MD Sent: 03/15/2019   5:12 AM EDT To: Elayne Guerin, [facility]  Great thank you!  Do I just need to sign the forms?  Let me know if I ned to do anything else ----- Message ----- From: Elayne Guerin, [facility] Sent: 03/14/2019  11:03 PM EDT To: [name], MD  Dr. Lorelei Pont,  Thank you for referring Ms. Madariaga for [place] due to medication affordability issues with Invokana. A medication review was completed and she was evaluated for patient assistance programs.  Although she meets the financial criteria to receive Invokana from [employer] patient assistance program, she does not meet their out-of-pocket medication expense requirement.  If deemed therapeutically appropriate, she could be switched to Jardiance and get it through [employer] as it does not require the out-of-pocket medication expenditure.  In addition, patient could also get Asmanex and Proventil HFA from [employer]ck's program at no cost.     Once a decision is made about Jardiance, applications will be sent to your office for signature and to be returned to [facility]. [facility] will handle the  correspondence with the programs.  Thank you so much for your time and consideration.   Blessings,  Elayne Guerin, PharmD, [place] Clinical Pharmacist [phone]

## 2019-03-28 ENCOUNTER — Other Ambulatory Visit: Payer: Self-pay | Admitting: Pharmacy Technician

## 2019-03-28 NOTE — Patient Outreach (Signed)
[place] [facility]) Care Management  03/28/2019  Rachael Buck 1941-07-12 [id number]   Successful outreach call placed to patient in regards to [employer] application for Asmanex and Proventil HFA and for Jardiance with BI.  Spoke to patient, HIPAA identifiers verified.  Patient informed she has not received the applications yet. The application was mailed on 7/21. Informed patientt our mail has to go to the hospital to have postage paid before it is mailed out. Informed patient I would check back with her next week and if she has not received them then we would mail them out again. Patient was agreeable with this solution.  Will followup with patient in 3-5 business days.  Berish Bohman P. Dahlila Pfahler, [place] Management [phone]

## 2019-04-03 ENCOUNTER — Other Ambulatory Visit: Payer: Self-pay | Admitting: Pharmacy Technician

## 2019-04-03 NOTE — Patient Outreach (Signed)
[place] [facility]) Care Management  04/03/2019  Rachael Buck [birth year] [id number]    Unsuccessful outreach call placed to patient in regards to [employer]ck applications for Asamanex and Proventil and BI application for [employer].  Unfortuantely patient did not answer the phone, HIPAA compliant voicemail left.  This was my 2nd call to inquire if patient had received the application. Patient had requested a phone call today to inquire if the applications had shown up and if not I was to mail out another set.  Will followup with patient in 2-5 business days if call is not returned.  Nallely Yost P. Alija Riano, [place] Management [phone]

## 2019-04-04 ENCOUNTER — Other Ambulatory Visit: Payer: Self-pay | Admitting: Family Medicine

## 2019-04-04 DIAGNOSIS — E782 Mixed hyperlipidemia: Secondary | ICD-10-CM

## 2019-04-04 DIAGNOSIS — D696 Thrombocytopenia, unspecified: Secondary | ICD-10-CM

## 2019-04-04 DIAGNOSIS — I10 Essential (primary) hypertension: Secondary | ICD-10-CM

## 2019-04-04 DIAGNOSIS — E119 Type 2 diabetes mellitus without complications: Secondary | ICD-10-CM

## 2019-04-05 ENCOUNTER — Other Ambulatory Visit: Payer: Self-pay | Admitting: Family Medicine

## 2019-04-05 ENCOUNTER — Other Ambulatory Visit: Payer: Self-pay | Admitting: Pharmacy Technician

## 2019-04-05 NOTE — Patient Outreach (Signed)
[place] [facility]) Care Management  04/05/2019  Rachael Buck 02-Apr-1941 [id number]   Unsuccessful outreach call placed to patient in regards to merck application for Asmanex and Proventil HFA as well as Jardiance with BI.  Unfortunately patient did not answer the phone, HIPAA compliant voicemail left.  Was calling patient to inquire if she had received the applications that were mailed out on 03/20/2019.  Will followup with patient in 2-5 business days if call is not returned.  Helaine Yackel P. Nechemia Chiappetta, [place] Management [phone]

## 2019-04-05 NOTE — Patient Outreach (Signed)
[place] [facility]) Care Management  04/05/2019  Rachael Buck 1941/07/16 [id number]    Incoming callr eceived from patient in regard to [employer] application for [employer] and Proventil and BI application for [employer].  Patient was returning my call. HIPAA identifiers verified.  Patient was calling to inform me that she believes she remembers seeing the applications. She informed she would make the necessary copies of the information needed and fill out the application. She informed she would call if she could not locate the applications.  Will followup with patient in 10-14 business days if application not received back.  Roizy Harold P. Xitlali Kastens, [place] Management [phone]

## 2019-04-16 ENCOUNTER — Other Ambulatory Visit: Payer: Self-pay | Admitting: Family Medicine

## 2019-04-25 ENCOUNTER — Other Ambulatory Visit: Payer: Self-pay | Admitting: Pharmacy Technician

## 2019-04-25 NOTE — Patient Outreach (Signed)
[place]) Care Management  04/25/2019  Rachael Buck 1941/07/29 [id number]   Today was my 4th outreach call to patient.  Successful outreach call placed to patient in regards to [facility] application for Jardiance and Scientist, clinical (histocompatibility and immunogenetics) for [employer] and Proventil.  Spoke to patient, HIPAA identifiers verified.  Patient informed she has all the necessary information gathered but that she had to go make copies of the information. She informed she would do that and place in the mail no later than Saturday 04/28/2019.  Will followup with patient in 10-15 business days if not received back.  [name] P. Jaecion Dempster, [place] Management [phone]

## 2019-05-15 ENCOUNTER — Other Ambulatory Visit: Payer: Self-pay | Admitting: Pharmacist

## 2019-05-15 NOTE — Patient Outreach (Signed)
[place] [facility]) Care Management  05/15/2019  LILYANNE MCQUOWN 1941/03/29 [id number]   Patient was called to follow up on medication assistance. HIPAA identifiers were obtained. Patient confirmed receipt of the patient assistance forms we sent to her but she said she has not had a chance to send them back to [place] because her husband has been in the hospital.    She said she would send them ASAP.  Plan: Route note to [employer] as an FYI Call patient back in 6-8 weeks. Close case if she has not completed the applications by that time.   Elayne Guerin, PharmD, [place] Clinical Pharmacist [phone]

## 2019-05-22 ENCOUNTER — Other Ambulatory Visit: Payer: Self-pay | Admitting: Pharmacy Technician

## 2019-05-22 NOTE — Patient Outreach (Signed)
[place] [facility]) Care Management  05/22/2019  Rachael Buck Aug 13, 1941 [id number]   Received all necessary documents and signatures from both patient and provider for Merck patient assistance for Asmanex HFA and Proventil HFA and for BI for Jardiance.  Submitted complete application via fax for BI. Submitted completed application for [employer] via mail.  Will followup with both companies to inquire about the application status in 0000000 business days.  Rachael Buck P. Rachael Buck, [place] Management [phone]

## 2019-05-24 ENCOUNTER — Other Ambulatory Visit: Payer: Self-pay | Admitting: *Deleted

## 2019-05-24 MED ORDER — DICYCLOMINE HCL 20 MG PO TABS: 20.0000 mg | ORAL_TABLET | Freq: Three times a day (TID) | ORAL | 0 refills | Status: DC | PRN

## 2019-05-29 ENCOUNTER — Other Ambulatory Visit: Payer: Self-pay | Admitting: Pharmacy Technician

## 2019-05-29 NOTE — Patient Outreach (Signed)
[place] [facility]) Care Management  05/29/2019  Rachael Buck [birth year] [id number]   Care coordination call placed to [employer] in regards to patient's application for Asmanex and Proventil.  Spoke to [place] who informs they have not received the application that was mailed to them on 05/22/2019. She informed to check back next week.  Will followup with [employer] in 3-5 business days.  Care coordination call placed to BI in regards to patient's application for Jardiance.   Spoke to [place] who informed patient had been APPROVED for the [facility] cares program 05/25/2019-08/30/2019. Evelena Peat informed the rx was processed on 05/28/2019 and should be shipping today 05/29/2019. She informed to allow 7-10 business days to arrive at patient's home.  Will followup with patient in 10-14 business days to inquire if medication was received.  Rachael Buck, [place] Management [phone]

## 2019-05-31 ENCOUNTER — Encounter: Payer: Self-pay | Admitting: Hematology & Oncology

## 2019-05-31 ENCOUNTER — Inpatient Hospital Stay: Payer: PPO

## 2019-05-31 ENCOUNTER — Inpatient Hospital Stay: Payer: PPO | Attending: Hematology & Oncology | Admitting: Hematology & Oncology

## 2019-05-31 ENCOUNTER — Other Ambulatory Visit: Payer: Self-pay

## 2019-05-31 VITALS — BP 151/65 | HR 96 | Temp 97.1°F | Resp 19 | Wt 199.0 lb

## 2019-05-31 DIAGNOSIS — R161 Splenomegaly, not elsewhere classified: Secondary | ICD-10-CM | POA: Insufficient documentation

## 2019-05-31 DIAGNOSIS — K7581 Nonalcoholic steatohepatitis (NASH): Secondary | ICD-10-CM | POA: Insufficient documentation

## 2019-05-31 DIAGNOSIS — Z79899 Other long term (current) drug therapy: Secondary | ICD-10-CM | POA: Diagnosis not present

## 2019-05-31 DIAGNOSIS — D6959 Other secondary thrombocytopenia: Secondary | ICD-10-CM | POA: Insufficient documentation

## 2019-05-31 DIAGNOSIS — D696 Thrombocytopenia, unspecified: Secondary | ICD-10-CM | POA: Diagnosis not present

## 2019-05-31 DIAGNOSIS — E1165 Type 2 diabetes mellitus with hyperglycemia: Secondary | ICD-10-CM

## 2019-05-31 LAB — CMP (CANCER CENTER ONLY)
ALT: 13 U/L (ref 0–44)
AST: 19 U/L (ref 15–41)
Albumin: 4.4 g/dL (ref 3.5–5.0)
Alkaline Phosphatase: 54 U/L (ref 38–126)
Anion gap: 11 (ref 5–15)
BUN: 14 mg/dL (ref 8–23)
CO2: 33 mmol/L — ABNORMAL HIGH (ref 22–32)
Calcium: 9.8 mg/dL (ref 8.9–10.3)
Chloride: 97 mmol/L — ABNORMAL LOW (ref 98–111)
Creatinine: 0.63 mg/dL (ref 0.44–1.00)
GFR, Est AFR Am: >60 mL/min (ref >60)
GFR, Estimated: >60 mL/min (ref >60)
Glucose, Bld: 218 mg/dL — ABNORMAL HIGH (ref 70–99)
Potassium: 3.3 mmol/L — ABNORMAL LOW (ref 3.5–5.1)
Sodium: 141 mmol/L (ref 135–145)
Total Bilirubin: 0.8 mg/dL (ref 0.3–1.2)
Total Protein: 7.7 g/dL (ref 6.5–8.1)

## 2019-05-31 LAB — CBC WITH DIFFERENTIAL (CANCER CENTER ONLY)
Abs Immature Granulocytes: 0.04 10*3/uL (ref 0.00–0.07)
Basophils Absolute: 0 10*3/uL (ref 0.0–0.1)
Basophils Relative: 1 %
Eosinophils Absolute: 0.1 10*3/uL (ref 0.0–0.5)
Eosinophils Relative: 1 %
HCT: 34.2 % — ABNORMAL LOW (ref 36.0–46.0)
Hemoglobin: 10.9 g/dL — ABNORMAL LOW (ref 12.0–15.0)
Immature Granulocytes: 1 %
Lymphocytes Relative: 26 %
Lymphs Abs: 1.5 10*3/uL (ref 0.7–4.0)
MCH: 26.5 pg (ref 26.0–34.0)
MCHC: 31.9 g/dL (ref 30.0–36.0)
MCV: 83.2 fL (ref 80.0–100.0)
Monocytes Absolute: 0.5 10*3/uL (ref 0.1–1.0)
Monocytes Relative: 8 %
Neutro Abs: 3.7 10*3/uL (ref 1.7–7.7)
Neutrophils Relative %: 63 %
Platelet Count: 79 10*3/uL — ABNORMAL LOW (ref 150–400)
RBC: 4.11 MIL/uL (ref 3.87–5.11)
RDW: 16.1 % — ABNORMAL HIGH (ref 11.5–15.5)
WBC Count: 5.8 10*3/uL (ref 4.0–10.5)
nRBC: 0 % (ref 0.0–0.2)

## 2019-05-31 LAB — FERRITIN: Ferritin: 8 ng/mL — ABNORMAL LOW (ref 11–307)

## 2019-05-31 LAB — HEMOGLOBIN A1C
Hgb A1c MFr Bld: 7.5 % — ABNORMAL HIGH (ref 4.8–5.6)
Mean Plasma Glucose: 168.55 mg/dL

## 2019-05-31 LAB — IRON AND TIBC
Iron: 43 ug/dL (ref 41–142)
Saturation Ratios: 9 % — ABNORMAL LOW (ref 21–57)
TIBC: 481 ug/dL — ABNORMAL HIGH (ref 236–444)
UIBC: 438 ug/dL — ABNORMAL HIGH (ref 120–384)

## 2019-05-31 NOTE — Progress Notes (Signed)
Hematology and Oncology Follow Up Visit  Rachael Buck [id number] [birth year] 78 y.o. 05/31/2019   Principle Diagnosis:   Thrombocytopenia -- NASH/Splenomegaly ; Immune based thrombocytopenia  Current Therapy:    Nplate q week -- surgery on 02/21/2018     Interim History:  Rachael Buck is back for follow-up.  We saw her 6 months ago.  She is doing okay.  Unfortunately, her husband has Alzheimer's.  He seems to have progression of his Alzheimer's.  She has had no problems with bleeding.  She does have a healing or resolving ecchymosis in the left temporal area.  She said that she had put up some boxes in 1 fell back onto her head.  She has had some abdominal discomfort.  This is in the right upper quadrant.  This might be her liver.  There is been no problems with leg swelling.  Peripheral blood sugars still are not that great.  This morning, her blood sugar was 218.  She really did not have any breakfast.  She did have iron deficiency when we last saw her.  Her ferritin was 8 with an iron saturation of only 8%.  We did give her dose of IV iron.  She does have some splenomegaly.  Her last ultrasound of the abdomen was done back in March.  She has not seen her hepatologist for a while.  Overall, I would have to say that her performance status is probably ECOG 1.     Medications:  Current Outpatient Medications:  .  albuterol (PROAIR HFA) 108 (90 Base) MCG/ACT inhaler, Inhale 2 puffs into the lungs every 4 (four) hours as needed for wheezing or shortness of breath., Disp: 1 Inhaler, Rfl: 6 .  ALPRAZolam (XANAX) 0.5 MG tablet, TAKE 1 TABLET BY MOUTH THREE TIMES DAILY, Disp: 90 tablet, Rfl: 2 .  colestipol (COLESTID) 1 g tablet, Take 1 g by mouth daily., Disp: , Rfl:  .  dicyclomine (BENTYL) 20 MG tablet, Take 1 tablet (20 mg total) by mouth 3 (three) times daily as needed., Disp: 360 tablet, Rfl: 0 .  diphenhydrAMINE (BENADRYL) 25 MG tablet, Take 25 mg by mouth every 6 (six)  hours as needed (for allergies.)., Disp: , Rfl:  .  empagliflozin (JARDIANCE) 10 MG TABS tablet, Take 10 mg by mouth daily., Disp: 90 tablet, Rfl: 3 .  glipiZIDE (GLUCOTROL XL) 5 MG 24 hr tablet, Take 1 tablet (5 mg total) by mouth daily with breakfast., Disp: 90 tablet, Rfl: 3 .  glucose blood (ONE TOUCH ULTRA TEST) test strip, TEST BLOOD SUGARS ONCE DAILY IN THE MORNING, Disp: 100 each, Rfl: 12 .  hydrochlorothiazide (HYDRODIURIL) 25 MG tablet, TAKE 1 TABLET BY MOUTH EVERY DAY, Disp: 90 tablet, Rfl: 1 .  hydrocortisone 2.5 % cream, Apply topically., Disp: , Rfl:  .  ipratropium-albuterol (DUONEB) 0.5-2.5 (3) MG/3ML SOLN, Take 3 mLs by nebulization every 6 (six) hours as needed., Disp: 360 mL, Rfl: 2 .  loperamide (IMODIUM) 2 MG capsule, Take 4-6 mg by mouth daily as needed (for IBS symptoms)., Disp: , Rfl:  .  Mometasone Furoate (ASMANEX HFA) 100 MCG/ACT AERO, Inhale 2 puffs into the lungs 2 (two) times daily., Disp: 1 Inhaler, Rfl: 12 .  omeprazole (PRILOSEC) 40 MG capsule, TAKE 1 CAPSULE(40 MG) BY MOUTH DAILY, Disp: 90 capsule, Rfl: 1 .  ONETOUCH DELICA LANCETS FINE MISC, 1 each by Other route daily., Disp: 100 each, Rfl: 12 .  sertraline (ZOLOFT) 50 MG tablet, TAKE 1 TABLET BY MOUTH EVERY  DAY, Disp: 90 tablet, Rfl: 1 .  simvastatin (ZOCOR) 80 MG tablet, TAKE 1 TABLET BY MOUTH EVERY DAY, Disp: 90 tablet, Rfl: 1  Allergies:  Allergies  Allergen Reactions  . Codeine Nausea And Vomiting  . Shrimp [Shellfish Allergy] Swelling  . Metformin And Related     Significant diarrhea     Past Medical History, Surgical history, Social history, and Family History were reviewed and updated.  Review of Systems: Review of Systems  Constitutional: Negative.   HENT:  Negative.   Eyes: Negative.   Respiratory: Positive for cough and shortness of breath.   Cardiovascular: Negative.   Gastrointestinal: Negative.   Endocrine: Negative.   Genitourinary: Negative.    Musculoskeletal: Positive for  myalgias.  Skin: Negative.   Neurological: Negative.   Hematological: Negative.   Psychiatric/Behavioral: Negative.     Physical Exam:  vitals were not taken for this visit.   Wt Readings from Last 3 Encounters:  11/29/18 197 lb 12.8 oz (89.7 kg)  09/04/18 205 lb (93 kg)  07/06/18 205 lb (93 kg)    Physical Exam Vitals signs reviewed.  HENT:     Head: Normocephalic and atraumatic.  Eyes:     Pupils: Pupils are equal, round, and reactive to light.  Neck:     Musculoskeletal: Normal range of motion.  Cardiovascular:     Rate and Rhythm: Normal rate and regular rhythm.     Heart sounds: Normal heart sounds.  Pulmonary:     Effort: Pulmonary effort is normal.     Breath sounds: Normal breath sounds.  Abdominal:     General: Bowel sounds are normal.     Palpations: Abdomen is soft.  Musculoskeletal: Normal range of motion.        General: No tenderness or deformity.  Lymphadenopathy:     Cervical: No cervical adenopathy.  Skin:    General: Skin is warm and dry.     Findings: No erythema or rash.  Neurological:     Mental Status: She is alert and oriented to person, place, and time.  Psychiatric:        Behavior: Behavior normal.        Thought Content: Thought content normal.        Judgment: Judgment normal.      Lab Results  Component Value Date   WBC 5.8 05/31/2019   HGB 10.9 (L) 05/31/2019   HCT 34.2 (L) 05/31/2019   MCV 83.2 05/31/2019   PLT 79 (L) 05/31/2019     Chemistry      Component Value Date/Time   NA 141 11/29/2018 0743   NA 127 (L) 02/14/2017 1335   K 3.6 11/29/2018 0743   K 3.6 02/14/2017 1335   CL 98 11/29/2018 0743   CL 92 (L) 02/14/2017 1335   CO2 34 (H) 11/29/2018 0743   CO2 28 02/14/2017 1335   BUN 13 11/29/2018 0743   BUN 7 (L) 02/14/2017 1335   CREATININE 0.62 11/29/2018 0743   CREATININE 0.47 (L) 02/14/2017 1335   CREATININE 0.56 07/24/2014 1204      Component Value Date/Time   CALCIUM 9.5 11/29/2018 0743   CALCIUM 9.3  02/14/2017 1335   ALKPHOS 55 11/29/2018 0743   ALKPHOS 68 02/14/2017 1335   AST 24 11/29/2018 0743   ALT 15 11/29/2018 0743   BILITOT 0.8 11/29/2018 0743         Impression and Plan: Rachael Buck is a 78 year old white female.  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  Thankfully, her platelet count is holding steady.  We will continue to follow her along.  We will get her back in 6 months.  We may have to think about doing another ultrasound.  I will see about getting one set up for when we see her that day.  It is interesting to see what her liver and spleen look like.  I still think that her prognosis is clearly dictated by her blood sugars and her NASH.       [name], MD 10/1/20208:08 AM

## 2019-06-04 ENCOUNTER — Other Ambulatory Visit: Payer: Self-pay | Admitting: Pharmacy Technician

## 2019-06-04 ENCOUNTER — Telehealth: Payer: Self-pay | Admitting: Family

## 2019-06-04 NOTE — Telephone Encounter (Signed)
Appointments scheduled per 10/1 los/ patient to get updated schedule at next visit on 10/9

## 2019-06-04 NOTE — Patient Outreach (Signed)
[place] [facility]) Care Management  06/04/2019  DEAUNNA RAGLE 10/24/1940 [id number]    Care coordination call placed to [employer] in regards to patient's application for Asmanex and Proventil.  Spoke to Mickel Baas who informed the application was received by [employer] on 9/30 and in turn they have mailed the attestation form to the patient. Once they have received the attestation form back then the enrollment process will continue.  Will followup with patient with this information.  Nezzie Manera P. Maylie Ashton, [place] Management [phone]

## 2019-06-04 NOTE — Patient Outreach (Signed)
[facility]) Care Management  06/04/2019  Rachael Buck Sep 16, 1940 [id number]   ADDENDUM  Successful outreach call placed to patient in regards to [facility] application for Jardiance and [employer] application for [employer] and Proventil.  Spoke to patient, HIPAA identifiers verified.  Patient informed she had received 90 days supply of Jardiance. Discussed refill procedure with patient and she verbalized understanding.  Informed patient that [employer] had mailed her out the attestation form. Discussed the process with her and informed her to send back the attestation form and original application. Informed patient that she could call me when she receives it and we could go over it together if she wishes. Patient verbalized understanding.  Will followup with patient in 7-10 business days to inquire if attestation form was received and sent back.  Jaymond Waage P. Lacretia Tindall, [place] Management [phone]

## 2019-06-08 ENCOUNTER — Other Ambulatory Visit: Payer: Self-pay

## 2019-06-08 ENCOUNTER — Inpatient Hospital Stay: Payer: PPO

## 2019-06-08 VITALS — BP 123/48 | HR 72 | Temp 97.1°F | Resp 20

## 2019-06-08 DIAGNOSIS — D696 Thrombocytopenia, unspecified: Secondary | ICD-10-CM

## 2019-06-08 DIAGNOSIS — D6959 Other secondary thrombocytopenia: Secondary | ICD-10-CM | POA: Diagnosis not present

## 2019-06-08 MED ORDER — SODIUM CHLORIDE 0.9 % IV SOLN
Freq: Once | INTRAVENOUS | Status: AC
  Administered 2019-06-08: 10:00:00 via INTRAVENOUS
  Filled 2019-06-08: qty 250

## 2019-06-08 MED ORDER — SODIUM CHLORIDE 0.9 % IV SOLN
510.0000 mg | Freq: Once | INTRAVENOUS | Status: AC
  Administered 2019-06-08: 510 mg via INTRAVENOUS
  Filled 2019-06-08: qty 17

## 2019-06-08 NOTE — Patient Instructions (Signed)

## 2019-06-15 ENCOUNTER — Other Ambulatory Visit: Payer: Self-pay | Admitting: Pharmacy Technician

## 2019-06-15 NOTE — Patient Outreach (Signed)
[place] [facility]) Care Management  06/15/2019  Rachael Buck Sep 03, 1940 [id number]  Successful outreach call placed to patient in regards to [employer] application for Asmanex HFA and Proventil HFA.  Spoke to patient, HIPAA identifiers verified.  Patient informed she has received the attestation form but has not mailed it back in yet. Discussed with patient again that she would need to answer the 3 questions on the attestation form as well as sign and date it. Also informed her to make sure she sent the attestation form and the original application back to [employer] in the envelope they provided. Patient verbalized understanding and said she would do it by this weekend.  Will followup with [employer] in 10-14 business days to confirm they received the attestation form.  Akili Corsetti P. Lollie Gunner, [place] Management [phone]

## 2019-06-21 ENCOUNTER — Ambulatory Visit: Payer: Self-pay | Admitting: Pharmacist

## 2019-06-22 ENCOUNTER — Other Ambulatory Visit: Payer: Self-pay | Admitting: Pharmacy Technician

## 2019-06-22 NOTE — Patient Outreach (Signed)
[facility]) Care Management  06/22/2019  RICHLYNN GAN 1941-06-27 [id number]    Care coordination call placed to [employer] in regards to patient's application for Asmanex and Proventil.  Spoke to [place] to inquire if she has received the patient's attestation form that she mailed back to them on 06/16/2019. Ar[place] informed they have not received it back and to check back in about 1 week.  Will followup with [employer] in 5-7 business days.  Dillan Lunden P. Emori Kamau, [place] Management [phone]

## 2019-06-26 ENCOUNTER — Encounter: Payer: Self-pay | Admitting: Family Medicine

## 2019-06-26 ENCOUNTER — Other Ambulatory Visit: Payer: Self-pay | Admitting: Family Medicine

## 2019-06-26 MED ORDER — ALPRAZOLAM 0.5 MG PO TABS: 0.5000 mg | ORAL_TABLET | Freq: Three times a day (TID) | ORAL | 0 refills | Status: DC

## 2019-06-26 NOTE — Telephone Encounter (Signed)
Medication Refill - Medication: alprazolam   Has the patient contacted their pharmacy? Yes.   Pt states that all of the [facility] pharmacy's are running out of this medication. Pt states this pharmacy has about 100 tablets that they said will only last until today. Please advise.  (Agent: If no, request that the patient contact the pharmacy for the refill.) (Agent: If yes, when and what did the pharmacy advise?)  Preferred Pharmacy (with phone number or street name):  [facility] [place], [address]  [place] [address]  Phone: [phone] Fax: [phone]  Not a 24 hour pharmacy; exact hours not known.     Agent: Please be advised that RX refills may take up to 3 business days. We ask that you follow-up with your pharmacy.

## 2019-06-26 NOTE — Telephone Encounter (Signed)
Requested medication (s) are due for refill today: yes  Requested medication (s) are on the active medication list: yes  Last refill:  04/16/2019  Future visit scheduled: no  Notes to clinic:  Pt states that all of the [facility] pharmacy[facility] are running out of this medication. Pt states this pharmacy has about 100 tablets that they said will only last until today. Please advise.    Requested Prescriptions  Pending Prescriptions Disp Refills   ALPRAZolam (XANAX) 0.5 MG tablet 90 tablet 2    Sig: Take 1 tablet (0.5 mg total) by mouth 3 (three) times daily.     Not Delegated - Psychiatry:  Anxiolytics/Hypnotics Failed - 06/26/2019 11:14 AM      Failed - This refill cannot be delegated      Failed - Urine Drug Screen completed in last 360 days.      Failed - Valid encounter within last 6 months    Recent Outpatient Visits          6 months ago Controlled type 2 diabetes mellitus without complication, without long-term current use of insulin ([place])   [facility] at [place], MD   9 months ago Hypotension due to drugs   [facility]ivist at [facility], Hyrum C, MD   1 year ago Controlled type 2 diabetes mellitus without complication, without long-term current use of insulin ([place])   [facility]ivist at [facility], Gay Filler, MD   1 year ago [place] at [facility], Gay Filler, MD   1 year ago [place] [facility] [place], Gay Filler, MD

## 2019-06-27 ENCOUNTER — Other Ambulatory Visit: Payer: Self-pay

## 2019-06-27 NOTE — Progress Notes (Addendum)
[place] at [facility] [place], [place], [place] [address] [phone]  Date:  06/28/2019   Name:  Rachael Buck   DOB:  December 05, 1940   MRN:  [id number]  PCP:  [name], MD    Chief Complaint: Abdominal Swelling (left side abdominal swelling, dull ache, taking advil )   History of Present Illness:  Rachael Buck is a 78 y.o. 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  She is not really able to tolerate Metformin She is taking glipizide only right now, notes that her sugar has been about 110 fasting recently   Lab Results  Component Value Date   HGBA1C 7.5 (H) 05/31/2019   Foot exam due Flu shot- done  Eye exam Mammogram- she is due, she will arrange  Tetanus booster  Former smoker-asked about pack years.  She smoked about 15 years, about 1 PPD - does not qualify for lung cancer screening  Quit 19 years ago  Her husband has Alzheimer's disease, she is caring for an elderly aunt as well  She is under a lot of stress right now   Albuterol Xanax colestid Bentyl jardiance Glipizide  hcyz asmanex zoloft- asked about increasing her dose, she would like to do this due to current stressors  zocor   Today she feels like he left side of her abdomen is more bloated and prominent than the right - it appears physically larger on the left side  She notes a dull ache in her abdomen No vomiting or diarrhea Not constipated Yesterday she ate less- eating made her feel more  bloated and uncomfortable No fever or chills  Wt Readings from Last 3 Encounters:  06/28/19 202 lb (91.6 kg)  05/31/19 199 lb (90.3 kg)  11/29/18 197 lb 12.8 oz (89.7 kg)   Her weight at home yesterday was about 5 lbs less than today  No urinary sx   CT abdomen pelvis from August 2019: IMPRESSION: 1. Enlarged cirrhotic liver without worrisome mass. 2. Post cholecystectomy. Prominence of biliary system felt to be related to post cholecystectomy state without obstructing stone or mass noted. 3. Splenomegaly. 4. Upper abdominal varices. Prominent collateral vessels throughout the abdomen and pelvis with slight haziness of fat planes but without primary bowel inflammatory process noted. 5.  Aortic Atherosclerosis (ICD10-I70.0).  Patient Active Problem List   Diagnosis Date Noted  . Cirrhosis of liver ([place]) 02/17/2018  . 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 Thrombocytopenia ([place]) 02/15/2017  . Varicose veins of left lower extremity with complications 42/70/6237  . Age-related cognitive decline 06/02/2015  . Fatigue 07/19/2014  . Orthostatic hypotension 05/14/2013  . Bronchitis with asthma, acute 01/29/2013  . Nail abnormality 09/04/2012  . Bleeding disorder ([place]) 05/08/2012  . Varices, esophageal ([place]) 03/21/2012  . NASH (nonalcoholic steatohepatitis) 03/21/2012  . HTN (hypertension) 12/21/2011  . Depression 03/28/2011  . IBS (irritable bowel syndrome) 03/23/2011  . INTRINSIC ASTHMA, UNSPECIFIED 08/27/2010  . DYSPNEA ON EXERTION 07/27/2010  . Uncontrolled diabetes mellitus ([place]) 07/24/2010  . MIXED HYPERLIPIDEMIA 07/24/2010  .  OBESITY, UNSPECIFIED 07/24/2010  . ALLERGIC RHINITIS CAUSE UNSPECIFIED 07/24/2010  . REFLUX ESOPHAGITIS 07/24/2010  . PAIN IN JOINT PELVIC REGION AND THIGH 07/24/2010    Past Medical History:  Diagnosis Date  . Anxiety   . Arthritis   . Asthma   . Cirrhosis, nonalcoholic ([place])   . Colon polyps   . Diabetes mellitus   .  Diverticulitis   . GERD (gastroesophageal reflux disease)   . Hyperlipidemia   . Hypertension   . NASH (nonalcoholic steatohepatitis)   . Obesity   . Pneumonia     Past Surgical History:  Procedure Laterality Date  . CHOLECYSTECTOMY  1981  . ENDOVENOUS ABLATION SAPHENOUS VEIN W/ LASER Left 02/21/2018   endovenous laser ablation left greater saphenous vein by Tinnie Gens MD   . [place]  . PARTIAL HYSTERECTOMY  1972  . ROTATOR CUFF REPAIR     bilateral. 2006, 2008  . 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 TOTAL KNEE ARTHROPLASTY Left 02/18/2017   Procedure: LEFT TOTAL KNEE ARTHROPLASTY;  Surgeon: Netta Cedars, MD;  Location: [facility];  Service: Orthopedics;  Laterality: Left;  Marland Kitchen VEIN SURGERY      Social History   Tobacco Use  . Smoking status: Former Research scientist (life sciences)  . Smokeless tobacco: Never Used  Substance Use Topics  . Alcohol use: No  . Drug use: No    Family History  Problem Relation Age of Onset  . Breast cancer Daughter   . Breast cancer Maternal Aunt   . Diabetes Brother   . Diabetes Sister   . Stomach cancer Mother   . Cancer Other   . GER disease Other   . Obesity Other     Allergies  Allergen Reactions  . Codeine Nausea And Vomiting  . Shrimp [Shellfish Allergy] Swelling  . Metformin And Related     Significant diarrhea     Medication list has been reviewed and updated.  Current Outpatient Medications on File Prior to Visit  Medication Sig Dispense Refill  . albuterol (PROAIR HFA) 108 (90 Base) MCG/ACT inhaler Inhale 2 puffs into the lungs every 4 (four) hours as needed for wheezing or shortness of breath. 1 Inhaler 6  . ALPRAZolam (XANAX) 0.5 MG tablet Take 1 tablet (0.5 mg total) by mouth 3 (three) times daily. 90 tablet 0  . colestipol (COLESTID) 1 g tablet Take 1 g by mouth daily.    Marland Kitchen dicyclomine (BENTYL) 20 MG tablet Take 1 tablet (20 mg total) by mouth 3 (three) times daily as needed. 360 tablet 0  . diphenhydrAMINE (BENADRYL) 25 MG  tablet Take 25 mg by mouth every 6 (six) hours as needed (for allergies.).    Marland Kitchen empagliflozin (JARDIANCE) 10 MG TABS tablet Take 10 mg by mouth daily. 90 tablet 3  . glipiZIDE (GLUCOTROL XL) 5 MG 24 hr tablet Take 1 tablet (5 mg total) by mouth daily with breakfast. 90 tablet 3  . glucose blood (ONE TOUCH ULTRA TEST) test strip TEST BLOOD SUGARS ONCE DAILY IN THE MORNING 100 each 12  . hydrochlorothiazide (HYDRODIURIL) 25 MG tablet TAKE 1 TABLET BY MOUTH EVERY DAY 90 tablet 1  . hydrocortisone 2.5 % cream Apply topically.    Marland Kitchen ipratropium-albuterol (DUONEB) 0.5-2.5 (3) MG/3ML SOLN Take 3 mLs by nebulization every 6 (six) hours as needed. 360 mL 2  . loperamide (IMODIUM) 2 MG capsule Take 4-6 mg by mouth daily as needed (for IBS symptoms).    . Mometasone Furoate (ASMANEX HFA) 100 MCG/ACT  AERO Inhale 2 puffs into the lungs 2 (two) times daily. 1 Inhaler 12  . omeprazole (PRILOSEC) 40 MG capsule TAKE 1 CAPSULE(40 MG) BY MOUTH DAILY 90 capsule 1  . ONETOUCH DELICA LANCETS FINE MISC 1 each by Other route daily. 100 each 12  . sertraline (ZOLOFT) 50 MG tablet TAKE 1 TABLET BY MOUTH EVERY DAY 90 tablet 1  . simvastatin (ZOCOR) 80 MG tablet TAKE 1 TABLET BY MOUTH EVERY DAY 90 tablet 1   No current facility-administered medications on file prior to visit.     Review of Systems:  As per HPI- otherwise negative.   Physical Examination: Vitals:   06/28/19 1014  BP: 126/70  Pulse: 99  Resp: 16  Temp: (!) 97.1 F (36.2 C)  SpO2: 94%   Vitals:   06/28/19 1014  Weight: 202 lb (91.6 kg)  Height: 5' 5"  (1.651 m)   Body mass index is 33.61 kg/m. Ideal Body Weight: Weight in (lb) to have BMI = 25: 149.9  GEN: WDWN, NAD, Non-toxic, A & O x 3, obese, looks well HEENT: Atraumatic, Normocephalic. Neck supple. No masses, No LAD. Ears and Nose: No external deformity. CV: RRR, No M/G/R. No JVD. No thrill. No extra heart sounds. PULM: CTA B, no wheezes, crackles, rhonchi. No retractions. No resp.  distress. No accessory muscle use. ABD: Belly is soft, +BS. No rebound. No HSM.  Her abdomen is mildly distended, mild generalized tenderness worse in the left lower quadrant.  Normal active bowel sounds Large healed scar from cholecystectomy in 1980s EXTR: No c/c/e NEURO Normal gait.  PSYCH: Normally interactive. Conversant. Not depressed or anxious appearing.  Calm demeanor.    Assessment and Plan: Controlled type 2 diabetes mellitus without complication, without long-term current use of insulin (HCC)  Left lower quadrant abdominal pain - Plan: CT Abdomen Pelvis W Contrast, CBC, Comprehensive metabolic panel, Amylase, Lipase  Essential hypertension  Cirrhosis of liver without ascites, unspecified hepatic cirrhosis type (HCC)  Depression, unspecified depression type - Plan: sertraline (ZOLOFT) 100 MG tablet  Patient with history of diabetes, [place]/cirrhosis, splenomegaly.  Here today with concern of abdominal discomfort and distention for the last 2 to 3 days.  Given her multiple risk factors and tenderness on exam we will do a CT scan today.  She is in agreement with this plan, I will be in touch as result later on today Blood pressures well controlled Check CBC, pancreatic enzymes, C met today Rachael Buck is under a lot of pressure caring for ill family members.  She would like to increase her sertraline to 100 mg which I think is a reasonable idea-she will let me know if not helpful  Signed Lamar Blinks, MD  I received her CT scan results, gave her a call  Ct Abdomen Pelvis W Contrast  Result Date: 06/28/2019 CLINICAL DATA:  Abdominal swelling for 3 days, LEFT lower quadrant pain question diverticulitis, cirrhosis due to NASH, diabetes mellitus, hypertension, diverticulitis EXAM: CT ABDOMEN AND PELVIS WITH CONTRAST TECHNIQUE: Multidetector CT imaging of the abdomen and pelvis was performed using the standard protocol following bolus administration of intravenous contrast. Sagittal  and coronal MPR images reconstructed from axial data set. CONTRAST:  140m OMNIPAQUE IOHEXOL 300 MG/ML SOLN IV. Dilute oral contrast. COMPARISON:  04/04/2018 FINDINGS: Lower chest: Lung bases clear Hepatobiliary: Nodular cirrhotic liver without focal mass. Gallbladder surgically absent. Mild central intrahepatic and extrahepatic biliary dilatation unchanged from previous exam. No CBD stones identified. Pancreas: Normal appearance Spleen: Enlarged, 12.5 x 6.2 x 14.8  cm (volume = 600 cm^3). Single calcified granuloma. No mass. Adrenals/Urinary Tract: Adrenal glands, kidneys, ureters and bladder normal appearance Stomach/Bowel: Normal appendix. Small hiatal hernia. Stomach decompressed, otherwise suboptimally assessed. Minimal diverticulosis of descending colon without evidence of diverticulitis. Remaining bowel loops unremarkable. Vascular/Lymphatic: Extensive atherosclerotic calcifications aorta and iliac arteries. Aorta normal caliber. No adenopathy. Reproductive: Uterus surgically absent with nonvisualization of ovaries Other: Chronic stranding of fat posterior to the anterior abdominal wall muscle planes in the anterior pelvis unchanged since 04/04/2018, nonspecific. No free air or free fluid. No hernia or definite acute inflammatory process. Musculoskeletal: Degenerative disc and facet disease changes of lumbar spine. Degenerative changes RIGHT hip joint. IMPRESSION: Cirrhotic liver with mild splenomegaly. Descending colonic diverticulosis without evidence of diverticulitis. No definite acute intra-abdominal or intrapelvic abnormalities. Electronically Signed   By: Lavonia Dana M.D.   On: 06/28/2019 13:49   We are both relieved that her CT does not show any mass or any acute pathology.  She does have a known cirrhotic liver and mild splenomegaly.  Her CT describes diverticulosis but not diverticulitis.  However given her current symptoms I think 2 or 3 days of bowel rest might be helpful.  I encouraged her to  try liquids only if not hungry, if she is hungry stick to a low residue diet for the next 48 to 72 hours.  I am hopeful this may improve her symptoms.  She is seeing Dr. Watt Climes tomorrow, encouraged her to keep this appointment I will be in touch with her labs ASAP  I received her blood work 10/30-message to patient  Results for orders placed or performed in visit on 06/28/19  CBC  Result Value Ref Range   WBC 5.6 4.0 - 10.5 K/uL   RBC 4.53 3.87 - 5.11 Mil/uL   Platelets 69.0 (L) 150.0 - 400.0 K/uL   Hemoglobin 12.6 12.0 - 15.0 g/dL   HCT 38.1 36.0 - 46.0 %   MCV 84.2 78.0 - 100.0 fl   MCHC 33.0 30.0 - 36.0 g/dL   RDW 20.6 (H) 11.5 - 15.5 %  Comprehensive metabolic panel  Result Value Ref Range   Sodium 139 135 - 145 mEq/L   Potassium 3.4 (L) 3.5 - 5.1 mEq/L   Chloride 96 96 - 112 mEq/L   CO2 35 (H) 19 - 32 mEq/L   Glucose, Bld 129 (H) 70 - 99 mg/dL   BUN 14 6 - 23 mg/dL   Creatinine, Ser 0.63 0.40 - 1.20 mg/dL   Total Bilirubin 1.2 0.2 - 1.2 mg/dL   Alkaline Phosphatase 56 39 - 117 U/L   AST 28 0 - 37 U/L   ALT 19 0 - 35 U/L   Total Protein 7.3 6.0 - 8.3 g/dL   Albumin 4.6 3.5 - 5.2 g/dL   Calcium 9.8 8.4 - 10.5 mg/dL   GFR 91.62 >60.00 mL/min  Amylase  Result Value Ref Range   Amylase 25 (L) 27 - 131 U/L  Lipase  Result Value Ref Range   Lipase 28.0 11.0 - 59.0 U/L

## 2019-06-28 ENCOUNTER — Other Ambulatory Visit: Payer: Self-pay

## 2019-06-28 ENCOUNTER — Ambulatory Visit (INDEPENDENT_AMBULATORY_CARE_PROVIDER_SITE_OTHER): Payer: PPO | Admitting: Family Medicine

## 2019-06-28 ENCOUNTER — Encounter: Payer: Self-pay | Admitting: Family Medicine

## 2019-06-28 ENCOUNTER — Ambulatory Visit (HOSPITAL_BASED_OUTPATIENT_CLINIC_OR_DEPARTMENT_OTHER)
Admission: RE | Admit: 2019-06-28 | Discharge: 2019-06-28 | Disposition: A | Discharge status: Home / Self Care | Payer: PPO | Source: Ambulatory Visit | Attending: Family Medicine | Admitting: Family Medicine

## 2019-06-28 VITALS — BP 126/70 | HR 99 | Temp 97.1°F | Resp 16 | Ht 65.0 in | Wt 202.0 lb

## 2019-06-28 DIAGNOSIS — F329 Major depressive disorder, single episode, unspecified: Secondary | ICD-10-CM

## 2019-06-28 DIAGNOSIS — R1032 Left lower quadrant pain: Secondary | ICD-10-CM | POA: Diagnosis not present

## 2019-06-28 DIAGNOSIS — I10 Essential (primary) hypertension: Secondary | ICD-10-CM

## 2019-06-28 DIAGNOSIS — K7460 Unspecified cirrhosis of liver: Secondary | ICD-10-CM | POA: Diagnosis not present

## 2019-06-28 DIAGNOSIS — E119 Type 2 diabetes mellitus without complications: Secondary | ICD-10-CM | POA: Diagnosis not present

## 2019-06-28 DIAGNOSIS — F32A Depression, unspecified: Secondary | ICD-10-CM

## 2019-06-28 DIAGNOSIS — R109 Unspecified abdominal pain: Secondary | ICD-10-CM | POA: Diagnosis not present

## 2019-06-28 LAB — COMPREHENSIVE METABOLIC PANEL
ALT: 19 U/L (ref 0–35)
AST: 28 U/L (ref 0–37)
Albumin: 4.6 g/dL (ref 3.5–5.2)
Alkaline Phosphatase: 56 U/L (ref 39–117)
BUN: 14 mg/dL (ref 6–23)
CO2: 35 mEq/L — ABNORMAL HIGH (ref 19–32)
Calcium: 9.8 mg/dL (ref 8.4–10.5)
Chloride: 96 mEq/L (ref 96–112)
Creatinine, Ser: 0.63 mg/dL (ref 0.40–1.20)
GFR: 91.62 mL/min (ref >60.00)
Glucose, Bld: 129 mg/dL — ABNORMAL HIGH (ref 70–99)
Potassium: 3.4 mEq/L — ABNORMAL LOW (ref 3.5–5.1)
Sodium: 139 mEq/L (ref 135–145)
Total Bilirubin: 1.2 mg/dL (ref 0.2–1.2)
Total Protein: 7.3 g/dL (ref 6.0–8.3)

## 2019-06-28 LAB — CBC
HCT: 38.1 % (ref 36.0–46.0)
Hemoglobin: 12.6 g/dL (ref 12.0–15.0)
MCHC: 33 g/dL (ref 30.0–36.0)
MCV: 84.2 fl (ref 78.0–100.0)
Platelets: 69 10*3/uL — ABNORMAL LOW (ref 150.0–400.0)
RBC: 4.53 Mil/uL (ref 3.87–5.11)
RDW: 20.6 % — ABNORMAL HIGH (ref 11.5–15.5)
WBC: 5.6 10*3/uL (ref 4.0–10.5)

## 2019-06-28 LAB — AMYLASE: Amylase: 25 U/L — ABNORMAL LOW (ref 27–131)

## 2019-06-28 LAB — LIPASE: Lipase: 28 U/L (ref 11.0–59.0)

## 2019-06-28 MED ORDER — IOHEXOL 300 MG/ML  SOLN
100.0000 mL | Freq: Once | INTRAMUSCULAR | Status: AC | PRN
  Administered 2019-06-28: 100 mL via INTRAVENOUS

## 2019-06-28 MED ORDER — SERTRALINE HCL 100 MG PO TABS: 100.0000 mg | ORAL_TABLET | Freq: Every day | ORAL | 3 refills | Status: DC

## 2019-06-28 NOTE — Patient Instructions (Signed)
It was good to see you today but I am sorry you are not feeling well!   I will be in touch with your CT results asap We will also check your liver, kidneys, blood counts and pancreas today

## 2019-06-29 ENCOUNTER — Encounter: Payer: Self-pay | Admitting: Family Medicine

## 2019-06-29 DIAGNOSIS — K7581 Nonalcoholic steatohepatitis (NASH): Secondary | ICD-10-CM | POA: Diagnosis not present

## 2019-06-29 DIAGNOSIS — D131 Benign neoplasm of stomach: Secondary | ICD-10-CM | POA: Diagnosis not present

## 2019-06-29 DIAGNOSIS — R932 Abnormal findings on diagnostic imaging of liver and biliary tract: Secondary | ICD-10-CM | POA: Diagnosis not present

## 2019-06-29 DIAGNOSIS — Z86010 Personal history of colonic polyps: Secondary | ICD-10-CM | POA: Diagnosis not present

## 2019-07-02 ENCOUNTER — Other Ambulatory Visit: Payer: Self-pay | Admitting: Pharmacy Technician

## 2019-07-02 NOTE — Patient Outreach (Signed)
[facility]) Care Management  07/02/2019  [name] 16-Apr-1941 [id number]  Care coordination call placed to [employer] in regards to patient's application for Asmanex and Proventil HFA.  Spoke to [place]. Jana Half informed they have not received back the patient's attestation form. She informed to check back in a few days.  Will followup with [employer] in 5-7 business days.  Rachael Buck P. Rachael Buck, [place] Management [phone]

## 2019-07-09 ENCOUNTER — Other Ambulatory Visit: Payer: Self-pay | Admitting: Pharmacy Technician

## 2019-07-09 NOTE — Patient Outreach (Signed)
[facility]) Care Management  07/09/2019  Rachael Buck June 13, 1941 [id number]    Care coordination call placed to [employer] in regards to Asmanex and Proventil application.  Spoke to [place] who informed they have not received the attestation from that was mailed back mid October. Gabby informed to check back in a few days.  Will followup with [employer] in 5-7 business days.  Rachael Buck, [place] Management [phone]

## 2019-07-15 ENCOUNTER — Other Ambulatory Visit: Payer: Self-pay | Admitting: Family Medicine

## 2019-07-16 ENCOUNTER — Other Ambulatory Visit: Payer: Self-pay | Admitting: Pharmacy Technician

## 2019-07-16 NOTE — Patient Outreach (Signed)
[facility]) Care Management  07/16/2019  Rachael Buck 09-05-1940 [id number]   Care coordination call placed to [employer] in regards to patient's application for Asmanex and Proventil.  Spoke to [place] who informed they have not received back the attestation form that the patient informed they mailed back on 06/15/2019. Caren Griffins informed they have experienced some delays in receiving mail. She informed if attestation form is not received back next week, she would contact her supervisor to see what they can do.  Will follow up with [employer] in 5-7 business days.  Brandon Wiechman P. Velina Drollinger, [place] Management [phone]

## 2019-07-23 ENCOUNTER — Other Ambulatory Visit: Payer: Self-pay | Admitting: Pharmacy Technician

## 2019-07-23 ENCOUNTER — Other Ambulatory Visit: Payer: Self-pay | Admitting: Family Medicine

## 2019-07-23 NOTE — Patient Outreach (Signed)
[facility]) Care Management  07/23/2019  Rachael Buck February 27, 1941 [id number]   Care coordination call placed to [employer] in regards to patient's application for Asmanex and Proventil HFA.  Spoke to [place] who informed patient has been APPROVED 07/17/2019-08/30/2019. She informed 3 inhalers of each kind was shipped on 07/20/2019 with an expected arrival date of 07/24/2019 by 9pm.  Will outreach patient in 3-7 business days to confirm medication was received.  Rachael Buck, [place] Management [phone]

## 2019-07-25 ENCOUNTER — Other Ambulatory Visit: Payer: Self-pay

## 2019-07-30 ENCOUNTER — Other Ambulatory Visit: Payer: Self-pay | Admitting: Pharmacy Technician

## 2019-07-30 NOTE — Patient Outreach (Signed)
[facility]) Care Management  07/30/2019  MERIS BORLAND [birth year] [id number]   Unsuccessful call placed to patient regarding patient assistance medication delivery of Asmanex and Proventil HFA from [employer], HIPAA compliant voicemail left.   Was calling patient to inquire if she has received her medications that were delivered last week.  Follow up:  Will followup with 2nd outreach attempt in 3-5 business days if call is not returned.  Gillie Fleites P. Siena Poehler, [place] Management [phone]

## 2019-07-30 NOTE — Patient Outreach (Signed)
[facility]) Care Management  07/30/2019  Rachael Buck 05-20-1941 [id number]  Care coordination call placed to [employer] in regards to Asmanex and Proventil application.  Spoke to [facility] who informed the medications were delivered on 07/24/2019. The tracking number is [id number].  Will outreach patient with this information.  [name] P. Lyndall Windt, [place] Management [phone]

## 2019-07-31 ENCOUNTER — Telehealth: Payer: Self-pay

## 2019-07-31 ENCOUNTER — Ambulatory Visit: Payer: Self-pay | Admitting: Pharmacist

## 2019-07-31 NOTE — Telephone Encounter (Signed)
Copied from [place] [id number]. Topic: General - Inquiry >> Jul 30, 2019  4:41 PM Reyne Dumas L wrote: Reason for CRM:   Pt wants to be put on a COVID vaccination list and would like to speak with someone about what that will look like.

## 2019-08-01 ENCOUNTER — Other Ambulatory Visit: Payer: Self-pay | Admitting: Pharmacy Technician

## 2019-08-01 NOTE — Telephone Encounter (Signed)
Sent patient mychart message

## 2019-08-01 NOTE — Patient Outreach (Signed)
[facility]) Care Management  08/01/2019  Rachael Buck 09/23/1940 [id number]    Unsuccessful call placed to patient regarding patient assistance medication delivery of Asmanex and Proventil HFA with [employer], HIPAA compliant voicemail left.   Was calling patient to inquire if she has received the above named medications.  Follow up:  WIll followup with patient in 5-7 business days if call is not returned.  Arrin Ishler P. Meliyah Simon, [place] Management [phone]

## 2019-08-08 ENCOUNTER — Other Ambulatory Visit: Payer: Self-pay | Admitting: Pharmacy Technician

## 2019-08-08 ENCOUNTER — Other Ambulatory Visit: Payer: Self-pay | Admitting: *Deleted

## 2019-08-08 MED ORDER — GLUCOSE BLOOD VI STRP: ORAL_STRIP | 1 refills | Status: DC

## 2019-08-08 NOTE — Patient Outreach (Signed)
[place] [facility]) Care Management  08/08/2019  LAPORSCHA LOEFFEL 11/02/1940 [id number]     Successful call placed to patient regarding patient assistance medication delivery of Asmanex HFA and Proventil HFA with [employer], HIPAA identifiers verified.   Patient informed she has received 3 inhalers of the Asmanex and 3 inhalers of the Proventil. Patient inquired if she could call in refills for these medications since she has refills on them. Informed patient that since she has Medicare Part D her enrollment with the patient assistance would end on 08/30/2019 and that she would have to re enroll for 2021.  Informed patient to reach out to [employer] by calling the concierge number on the back of the card to inquire who was taking over their care management needs for 2021. Once she has that information then informed patient to call the new vendor and express her interest in applying for patient assistance for her inhalers as well as the Jardiance with [employer].  Patient inquired if she could get a refill of Jardiance before year's end. Informed patient to call [employer] (number is on the pharmacy label on the medication bottle) to request her refill. Patient verbalized understanding.  Patient informed she had not other questions or concerns at this time.     Follow up:  Will route note to [place] for case closure  Kaleya Douse P. Pinchas Reither, [place] Management [phone]

## 2019-08-08 NOTE — Telephone Encounter (Signed)
Received request from [facility] for one touch ultra blue test strips. Refill sent.

## 2019-08-09 ENCOUNTER — Telehealth: Payer: Self-pay | Admitting: Pharmacist

## 2019-08-09 NOTE — Patient Outreach (Signed)
[facility] [facility]) Care Management  08/09/2019  Rachael Buck March 31, 1941 [id number]   Patient's case is being closed because she has completed the patient assistance process. She received Asmanex and Proventil HFA at no cost.  Plan: Close patient's case. Case will gladly be reopened upon request.  Elayne Guerin, PharmD, [place] Clinical Pharmacist [phone]

## 2019-08-16 ENCOUNTER — Ambulatory Visit: Payer: Self-pay | Admitting: Pharmacist

## 2019-09-10 ENCOUNTER — Other Ambulatory Visit: Payer: Self-pay | Admitting: Family Medicine

## 2019-09-10 NOTE — Telephone Encounter (Signed)
Requesting:  alprazolam Contract:   None UDS:   None Last Visit:    06/28/2019 Next Visit:   none Last Refill:   #90 on 06/26/2019  Please Advise

## 2019-09-21 DIAGNOSIS — H5211 Myopia, right eye: Secondary | ICD-10-CM | POA: Diagnosis not present

## 2019-09-21 DIAGNOSIS — H35032 Hypertensive retinopathy, left eye: Secondary | ICD-10-CM | POA: Diagnosis not present

## 2019-09-21 DIAGNOSIS — E103292 Type 1 diabetes mellitus with mild nonproliferative diabetic retinopathy without macular edema, left eye: Secondary | ICD-10-CM | POA: Diagnosis not present

## 2019-09-21 LAB — HM DIABETES EYE EXAM

## 2019-09-25 ENCOUNTER — Other Ambulatory Visit: Payer: Self-pay

## 2019-09-25 MED ORDER — HYDROCHLOROTHIAZIDE 25 MG PO TABS: 25.0000 mg | ORAL_TABLET | Freq: Every day | ORAL | 1 refills | Status: DC

## 2019-10-26 ENCOUNTER — Telehealth: Payer: Self-pay | Admitting: Family Medicine

## 2019-10-26 MED ORDER — OMEPRAZOLE 40 MG PO CPDR: DELAYED_RELEASE_CAPSULE | ORAL | 1 refills | Status: DC

## 2019-10-26 NOTE — Telephone Encounter (Signed)
Refill sent.

## 2019-10-26 NOTE — Telephone Encounter (Signed)
Medication: omeprazole (PRILOSEC) 40 MG capsule    Has the patient contacted their pharmacy? Yes.   (If no, request that the patient contact the pharmacy for the refill.) (If yes, when and what did the pharmacy advise?)  Preferred Pharmacy (with phone number or street name):  [id number] [address]  [place], [place] [address]  Phone:  [phone] Fax:  [phone]  DEA #:  [id number]    Agent: Please be advised that RX refills may take up to 3 business days. We ask that you follow-up with your pharmacy.

## 2019-11-26 ENCOUNTER — Telehealth: Payer: Self-pay | Admitting: Family Medicine

## 2019-11-26 ENCOUNTER — Other Ambulatory Visit: Payer: Self-pay

## 2019-11-26 MED ORDER — EMPAGLIFLOZIN 10 MG PO TABS: 10.0000 mg | ORAL_TABLET | Freq: Every day | ORAL | 3 refills | Status: DC

## 2019-11-26 NOTE — Telephone Encounter (Signed)
Medication:empagliflozin (JARDIANCE) 10 MG TABS tablet    Has the patient contacted their pharmacy? No. (If no, request that the patient contact the pharmacy for the refill.) (If yes, when and what did the pharmacy advise?)  Preferred Pharmacy (with phone number or street name):   [facility]  [place], [place] [address]  Phone:  [phone] Fax:  [phone]    Agent: Please be advised that RX refills may take up to 3 business days. We ask that you follow-up with your pharmacy.

## 2019-11-27 ENCOUNTER — Other Ambulatory Visit: Payer: Self-pay

## 2019-11-27 ENCOUNTER — Ambulatory Visit (HOSPITAL_BASED_OUTPATIENT_CLINIC_OR_DEPARTMENT_OTHER)
Admission: RE | Admit: 2019-11-27 | Discharge: 2019-11-27 | Disposition: A | Discharge status: Home / Self Care | Payer: PPO | Source: Ambulatory Visit | Attending: Hematology & Oncology | Admitting: Hematology & Oncology

## 2019-11-27 ENCOUNTER — Telehealth: Payer: Self-pay | Admitting: Family Medicine

## 2019-11-27 DIAGNOSIS — R161 Splenomegaly, not elsewhere classified: Secondary | ICD-10-CM | POA: Diagnosis not present

## 2019-11-27 DIAGNOSIS — D696 Thrombocytopenia, unspecified: Secondary | ICD-10-CM | POA: Diagnosis not present

## 2019-11-27 NOTE — Telephone Encounter (Signed)
Patient came into office to have form filled out by Dr. Lorelei Pont . Patient needs medication cost help. Patient would like to be informed once form is completed for pick up [phone] .

## 2019-11-27 NOTE — Telephone Encounter (Signed)
Patient dropped off form into office to be filled out by Dr. Lorelei Pont .Form is help with medication cost. Patient would like for the office to call her once form is completed, so she can pick it up . [phone]

## 2019-11-27 NOTE — Telephone Encounter (Signed)
ERROR PUT IN TWICE

## 2019-11-28 NOTE — Telephone Encounter (Signed)
Placed in providers folder to sign when back in town.

## 2019-11-29 ENCOUNTER — Other Ambulatory Visit: Payer: Self-pay

## 2019-11-29 ENCOUNTER — Telehealth: Payer: Self-pay | Admitting: Family

## 2019-11-29 ENCOUNTER — Encounter: Payer: Self-pay | Admitting: Family

## 2019-11-29 ENCOUNTER — Inpatient Hospital Stay (HOSPITAL_BASED_OUTPATIENT_CLINIC_OR_DEPARTMENT_OTHER): Payer: PPO | Admitting: Family

## 2019-11-29 ENCOUNTER — Inpatient Hospital Stay: Payer: PPO | Attending: Family

## 2019-11-29 VITALS — BP 124/62 | HR 84 | Temp 97.1°F | Resp 18 | Ht 65.0 in | Wt 198.1 lb

## 2019-11-29 DIAGNOSIS — R161 Splenomegaly, not elsewhere classified: Secondary | ICD-10-CM | POA: Diagnosis not present

## 2019-11-29 DIAGNOSIS — R5383 Other fatigue: Secondary | ICD-10-CM | POA: Diagnosis not present

## 2019-11-29 DIAGNOSIS — K7581 Nonalcoholic steatohepatitis (NASH): Secondary | ICD-10-CM | POA: Diagnosis not present

## 2019-11-29 DIAGNOSIS — D509 Iron deficiency anemia, unspecified: Secondary | ICD-10-CM | POA: Diagnosis not present

## 2019-11-29 DIAGNOSIS — Z79899 Other long term (current) drug therapy: Secondary | ICD-10-CM | POA: Insufficient documentation

## 2019-11-29 DIAGNOSIS — D696 Thrombocytopenia, unspecified: Secondary | ICD-10-CM | POA: Diagnosis not present

## 2019-11-29 DIAGNOSIS — K7460 Unspecified cirrhosis of liver: Secondary | ICD-10-CM | POA: Diagnosis not present

## 2019-11-29 DIAGNOSIS — D500 Iron deficiency anemia secondary to blood loss (chronic): Secondary | ICD-10-CM

## 2019-11-29 DIAGNOSIS — R0602 Shortness of breath: Secondary | ICD-10-CM | POA: Diagnosis not present

## 2019-11-29 LAB — CMP (CANCER CENTER ONLY)
ALT: 14 U/L (ref 0–44)
AST: 22 U/L (ref 15–41)
Albumin: 4.3 g/dL (ref 3.5–5.0)
Alkaline Phosphatase: 48 U/L (ref 38–126)
Anion gap: 8 (ref 5–15)
BUN: 13 mg/dL (ref 8–23)
CO2: 34 mmol/L — ABNORMAL HIGH (ref 22–32)
Calcium: 9.3 mg/dL (ref 8.9–10.3)
Chloride: 97 mmol/L — ABNORMAL LOW (ref 98–111)
Creatinine: 0.63 mg/dL (ref 0.44–1.00)
GFR, Est AFR Am: >60 mL/min (ref >60)
GFR, Estimated: >60 mL/min (ref >60)
Glucose, Bld: 188 mg/dL — ABNORMAL HIGH (ref 70–99)
Potassium: 3.1 mmol/L — ABNORMAL LOW (ref 3.5–5.1)
Sodium: 139 mmol/L (ref 135–145)
Total Bilirubin: 0.8 mg/dL (ref 0.3–1.2)
Total Protein: 7.2 g/dL (ref 6.5–8.1)

## 2019-11-29 LAB — FERRITIN: Ferritin: 8 ng/mL — ABNORMAL LOW (ref 11–307)

## 2019-11-29 LAB — CBC WITH DIFFERENTIAL (CANCER CENTER ONLY)
Abs Immature Granulocytes: 0.03 10*3/uL (ref 0.00–0.07)
Basophils Absolute: 0 10*3/uL (ref 0.0–0.1)
Basophils Relative: 1 %
Eosinophils Absolute: 0 10*3/uL (ref 0.0–0.5)
Eosinophils Relative: 1 %
HCT: 31.2 % — ABNORMAL LOW (ref 36.0–46.0)
Hemoglobin: 9.8 g/dL — ABNORMAL LOW (ref 12.0–15.0)
Immature Granulocytes: 1 %
Lymphocytes Relative: 24 %
Lymphs Abs: 1.2 10*3/uL (ref 0.7–4.0)
MCH: 25.1 pg — ABNORMAL LOW (ref 26.0–34.0)
MCHC: 31.4 g/dL (ref 30.0–36.0)
MCV: 80 fL (ref 80.0–100.0)
Monocytes Absolute: 0.4 10*3/uL (ref 0.1–1.0)
Monocytes Relative: 8 %
Neutro Abs: 3.2 10*3/uL (ref 1.7–7.7)
Neutrophils Relative %: 65 %
Platelet Count: 68 10*3/uL — ABNORMAL LOW (ref 150–400)
RBC: 3.9 MIL/uL (ref 3.87–5.11)
RDW: 15.9 % — ABNORMAL HIGH (ref 11.5–15.5)
WBC Count: 4.9 10*3/uL (ref 4.0–10.5)
nRBC: 0 % (ref 0.0–0.2)

## 2019-11-29 LAB — IRON AND TIBC
Iron: 34 ug/dL — ABNORMAL LOW (ref 41–142)
Saturation Ratios: 7 % — ABNORMAL LOW (ref 21–57)
TIBC: 511 ug/dL — ABNORMAL HIGH (ref 236–444)
UIBC: 477 ug/dL — ABNORMAL HIGH (ref 120–384)

## 2019-11-29 LAB — PLATELET BY CITRATE

## 2019-11-29 LAB — OCCULT BLOOD X 1 CARD TO LAB, STOOL
Fecal Occult Bld: NEGATIVE
Fecal Occult Bld: NEGATIVE
Fecal Occult Bld: NEGATIVE

## 2019-11-29 NOTE — Progress Notes (Signed)
Hematology and Oncology Follow Up Visit  Rachael Buck [id number] 1941-04-14 79 y.o. 11/29/2019   Principle Diagnosis:  Thrombocytopenia -- NASH/Splenomegaly ; Immune based thrombocytopenia Iron deficiency   Current Therapy:        Nplate as indicated  IV iron as indicated    Interim History:  Rachael Buck is here today for follow-up. She is symptomatic with fatigue, SOB with exertion, palpitations and lightheadedness effecting balance at times.  She denies any falls or syncopal episodes.  Hgb is down to 9.8, platelets 68 and WBC count 4.9.  She has not noted any episodes of bleeding. No bruising or petechiae.  [place] earlier this week showed showed stable splenomegaly and hepatic cirrhosis.  She does feel pressure at times in her abdomen.  No fever, chills, n/v, cough, rash, chest pain, abdominal pain or changes in bowel or bladder habits.  She has mild puffiness in her hands and feet at the end of the day.  She will occasionally have tingling in her toes at night while laying in bed.  She has maintained a good appetite and is doing her best to stay well hydrated.   ECOG Performance Status: 1 - Symptomatic but completely ambulatory  Medications:  Allergies as of 11/29/2019      Reactions   Codeine Nausea And Vomiting   Shrimp [shellfish Allergy] Swelling   Metformin And Related    Significant diarrhea       Medication List       Accurate as of November 29, 2019  9:32 AM. If you have any questions, ask your nurse or doctor.        albuterol 108 (90 Base) MCG/ACT inhaler Commonly known as: ProAir HFA Inhale 2 puffs into the lungs every 4 (four) hours as needed for wheezing or shortness of breath.   ALPRAZolam 0.5 MG tablet Commonly known as: XANAX TAKE 1 TABLET BY MOUTH THREE TIMES DAILY   colestipol 1 g tablet Commonly known as: COLESTID Take 1 g by mouth daily.   dicyclomine 20 MG tablet Commonly known as: BENTYL Take 1 tablet (20 mg total) by mouth 3 (three)  times daily as needed.   diphenhydrAMINE 25 MG tablet Commonly known as: BENADRYL Take 25 mg by mouth every 6 (six) hours as needed (for allergies.).   empagliflozin 10 MG Tabs tablet Commonly known as: JARDIANCE Take 10 mg by mouth daily.   glipiZIDE 5 MG 24 hr tablet Commonly known as: GLUCOTROL XL Take 1 tablet (5 mg total) by mouth daily with breakfast.   glucose blood test strip Commonly known as: ONE TOUCH ULTRA TEST TEST BLOOD SUGARS ONCE DAILY IN THE MORNING   hydrochlorothiazide 25 MG tablet Commonly known as: HYDRODIURIL Take 1 tablet (25 mg total) by mouth daily.   hydrocortisone 2.5 % cream Apply topically.   ipratropium-albuterol 0.5-2.5 (3) MG/3ML Soln Commonly known as: DUONEB Take 3 mLs by nebulization every 6 (six) hours as needed.   loperamide 2 MG capsule Commonly known as: IMODIUM Take 4-6 mg by mouth daily as needed (for IBS symptoms).   Mometasone Furoate 100 MCG/ACT Aero Commonly known as: Asmanex HFA Inhale 2 puffs into the lungs 2 (two) times daily.   omeprazole 40 MG capsule Commonly known as: PRILOSEC TAKE 1 CAPSULE(40 MG) BY MOUTH DAILY   OneTouch Delica Lancets Fine Misc 1 each by Other route daily.   sertraline 100 MG tablet Commonly known as: ZOLOFT Take 1 tablet (100 mg total) by mouth daily.   sertraline 50  MG tablet Commonly known as: ZOLOFT TAKE 1 TABLET BY MOUTH EVERY DAY   simvastatin 80 MG tablet Commonly known as: ZOCOR TAKE 1 TABLET BY MOUTH EVERY DAY       Allergies:  Allergies  Allergen Reactions  . Codeine Nausea And Vomiting  . Shrimp [Shellfish Allergy] Swelling  . Metformin And Related     Significant diarrhea     Past Medical History, Surgical history, Social history, and Family History were reviewed and updated.  Review of Systems: All other 10 point review of systems is negative.   Physical Exam:  vitals were not taken for this visit.   Wt Readings from Last 3 Encounters:  06/28/19 202 lb  (91.6 kg)  05/31/19 199 lb (90.3 kg)  11/29/18 197 lb 12.8 oz (89.7 kg)    Ocular: Sclerae unicteric, pupils equal, round and reactive to light Ear-nose-throat: Oropharynx clear, dentition fair Lymphatic: No cervical or supraclavicular adenopathy Lungs no rales or rhonchi, good excursion bilaterally Heart regular rate and rhythm, no murmur appreciated Abd soft, nontender, positive bowel sounds, no liver or spleen tip palpated on exam, no fluid wave  MSK no focal spinal tenderness, no joint edema Neuro: non-focal, well-oriented, appropriate affect Breasts: Deferred   Lab Results  Component Value Date   WBC 4.9 11/29/2019   HGB 9.8 (L) 11/29/2019   HCT 31.2 (L) 11/29/2019   MCV 80.0 11/29/2019   PLT 68 (L) 11/29/2019   Lab Results  Component Value Date   FERRITIN 8 (L) 05/31/2019   IRON 43 05/31/2019   TIBC 481 (H) 05/31/2019   UIBC 438 (H) 05/31/2019   IRONPCTSAT 9 (L) 05/31/2019   Lab Results  Component Value Date   RETICCTPCT 2.3 (H) 02/02/2018   RBC 3.90 11/29/2019   No results found for: KPAFRELGTCHN, LAMBDASER, KAPLAMBRATIO No results found for: IGGSERUM, IGA, IGMSERUM No results found for: Odetta Pink, SPEI   Chemistry      Component Value Date/Time   NA 139 06/28/2019 1042   NA 127 (L) 02/14/2017 1335   K 3.4 (L) 06/28/2019 1042   K 3.6 02/14/2017 1335   CL 96 06/28/2019 1042   CL 92 (L) 02/14/2017 1335   CO2 35 (H) 06/28/2019 1042   CO2 28 02/14/2017 1335   BUN 14 06/28/2019 1042   BUN 7 (L) 02/14/2017 1335   CREATININE 0.63 06/28/2019 1042   CREATININE 0.63 05/31/2019 0750   CREATININE 0.47 (L) 02/14/2017 1335   CREATININE 0.56 07/24/2014 1204      Component Value Date/Time   CALCIUM 9.8 06/28/2019 1042   CALCIUM 9.3 02/14/2017 1335   ALKPHOS 56 06/28/2019 1042   ALKPHOS 68 02/14/2017 1335   AST 28 06/28/2019 1042   AST 19 05/31/2019 0750   ALT 19 06/28/2019 1042   ALT 13 05/31/2019 0750    BILITOT 1.2 06/28/2019 1042   BILITOT 0.8 05/31/2019 0750       Impression and Plan: Rachael Buck is a very pleasant 79 yo caucasian female with 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 [place] this week showed the spleen and liver to be stable.  Her platelet count is stable, No Nplate needed at this time per Dr. Marin Olp.  She has had a significant decrease in her Hgb. Iron studies are pending. We will replace if needed.  I did do a rectal exam, with Stacy RN as chaperone, with hemoccult testing. Results are pending. If positive we will refer her back to her GI  Dr. Watt Climes.  We will plan to see her back in another 6 months.  She will contact our office with any questions or concerns. We can certainly see him sooner if needed.   Laverna Peace, NP 4/1/20219:32 AM

## 2019-11-29 NOTE — Telephone Encounter (Signed)
Called and LMVM for patient with updated appointment scheduled per 4/1 los

## 2019-11-30 ENCOUNTER — Telehealth: Payer: Self-pay | Admitting: Family

## 2019-11-30 ENCOUNTER — Other Ambulatory Visit: Payer: Self-pay | Admitting: Family

## 2019-11-30 NOTE — Telephone Encounter (Signed)
Called and spoke with patient regarding appointments added per 4/2 sch msg

## 2019-12-03 ENCOUNTER — Inpatient Hospital Stay: Payer: PPO

## 2019-12-03 ENCOUNTER — Other Ambulatory Visit: Payer: Self-pay

## 2019-12-03 VITALS — BP 139/54 | HR 82 | Temp 97.1°F | Resp 17

## 2019-12-03 DIAGNOSIS — D696 Thrombocytopenia, unspecified: Secondary | ICD-10-CM | POA: Diagnosis not present

## 2019-12-03 MED ORDER — SODIUM CHLORIDE 0.9 % IV SOLN
Freq: Once | INTRAVENOUS | Status: AC
  Filled 2019-12-03: qty 250

## 2019-12-03 MED ORDER — SODIUM CHLORIDE 0.9 % IV SOLN
200.0000 mg | Freq: Once | INTRAVENOUS | Status: AC
  Administered 2019-12-03: 200 mg via INTRAVENOUS
  Filled 2019-12-03: qty 200

## 2019-12-05 NOTE — Telephone Encounter (Signed)
Placed up front for patient to pick up Monday.

## 2019-12-07 ENCOUNTER — Emergency Department (HOSPITAL_BASED_OUTPATIENT_CLINIC_OR_DEPARTMENT_OTHER): Payer: PPO

## 2019-12-07 ENCOUNTER — Telehealth: Payer: Self-pay | Admitting: Family Medicine

## 2019-12-07 ENCOUNTER — Other Ambulatory Visit: Payer: Self-pay

## 2019-12-07 ENCOUNTER — Encounter (HOSPITAL_BASED_OUTPATIENT_CLINIC_OR_DEPARTMENT_OTHER): Payer: Self-pay

## 2019-12-07 ENCOUNTER — Emergency Department (HOSPITAL_BASED_OUTPATIENT_CLINIC_OR_DEPARTMENT_OTHER)
Admission: EM | Admit: 2019-12-07 | Discharge: 2019-12-07 | Disposition: A | Discharge status: Home / Self Care | Payer: PPO | Attending: Emergency Medicine | Admitting: Emergency Medicine

## 2019-12-07 DIAGNOSIS — J302 Other seasonal allergic rhinitis: Secondary | ICD-10-CM | POA: Diagnosis not present

## 2019-12-07 DIAGNOSIS — Z79899 Other long term (current) drug therapy: Secondary | ICD-10-CM | POA: Insufficient documentation

## 2019-12-07 DIAGNOSIS — Z20822 Contact with and (suspected) exposure to covid-19: Secondary | ICD-10-CM | POA: Diagnosis not present

## 2019-12-07 DIAGNOSIS — Z87891 Personal history of nicotine dependence: Secondary | ICD-10-CM | POA: Insufficient documentation

## 2019-12-07 DIAGNOSIS — I10 Essential (primary) hypertension: Secondary | ICD-10-CM | POA: Insufficient documentation

## 2019-12-07 DIAGNOSIS — R0602 Shortness of breath: Secondary | ICD-10-CM | POA: Diagnosis not present

## 2019-12-07 DIAGNOSIS — J4521 Mild intermittent asthma with (acute) exacerbation: Secondary | ICD-10-CM | POA: Insufficient documentation

## 2019-12-07 DIAGNOSIS — E876 Hypokalemia: Secondary | ICD-10-CM | POA: Insufficient documentation

## 2019-12-07 LAB — URINALYSIS, ROUTINE W REFLEX MICROSCOPIC
Bilirubin Urine: NEGATIVE
Glucose, UA: >=500 mg/dL — AB
Hgb urine dipstick: NEGATIVE
Ketones, ur: NEGATIVE mg/dL
Nitrite: NEGATIVE
Protein, ur: NEGATIVE mg/dL
Specific Gravity, Urine: 1.01 (ref 1.005–1.030)
pH: 7 (ref 5.0–8.0)

## 2019-12-07 LAB — CBC WITH DIFFERENTIAL/PLATELET
Abs Immature Granulocytes: 0.05 10*3/uL (ref 0.00–0.07)
Basophils Absolute: 0 10*3/uL (ref 0.0–0.1)
Basophils Relative: 1 %
Eosinophils Absolute: 0 10*3/uL (ref 0.0–0.5)
Eosinophils Relative: 1 %
HCT: 30.6 % — ABNORMAL LOW (ref 36.0–46.0)
Hemoglobin: 9.7 g/dL — ABNORMAL LOW (ref 12.0–15.0)
Immature Granulocytes: 1 %
Lymphocytes Relative: 18 %
Lymphs Abs: 1.2 10*3/uL (ref 0.7–4.0)
MCH: 25.4 pg — ABNORMAL LOW (ref 26.0–34.0)
MCHC: 31.7 g/dL (ref 30.0–36.0)
MCV: 80.1 fL (ref 80.0–100.0)
Monocytes Absolute: 0.4 10*3/uL (ref 0.1–1.0)
Monocytes Relative: 7 %
Neutro Abs: 4.8 10*3/uL (ref 1.7–7.7)
Neutrophils Relative %: 72 %
Platelets: 66 10*3/uL — ABNORMAL LOW (ref 150–400)
RBC: 3.82 MIL/uL — ABNORMAL LOW (ref 3.87–5.11)
RDW: 17 % — ABNORMAL HIGH (ref 11.5–15.5)
Smear Review: NORMAL
WBC: 6.5 10*3/uL (ref 4.0–10.5)
nRBC: 0 % (ref 0.0–0.2)

## 2019-12-07 LAB — BASIC METABOLIC PANEL
Anion gap: 11 (ref 5–15)
BUN: 9 mg/dL (ref 8–23)
CO2: 31 mmol/L (ref 22–32)
Calcium: 9.2 mg/dL (ref 8.9–10.3)
Chloride: 95 mmol/L — ABNORMAL LOW (ref 98–111)
Creatinine, Ser: 0.56 mg/dL (ref 0.44–1.00)
GFR calc Af Amer: >60 mL/min (ref >60)
GFR calc non Af Amer: >60 mL/min (ref >60)
Glucose, Bld: 178 mg/dL — ABNORMAL HIGH (ref 70–99)
Potassium: 2.8 mmol/L — ABNORMAL LOW (ref 3.5–5.1)
Sodium: 137 mmol/L (ref 135–145)

## 2019-12-07 LAB — TROPONIN I (HIGH SENSITIVITY): Troponin I (High Sensitivity): 4 ng/L (ref <18)

## 2019-12-07 LAB — URINALYSIS, MICROSCOPIC (REFLEX)

## 2019-12-07 LAB — BRAIN NATRIURETIC PEPTIDE: B Natriuretic Peptide: 37.4 pg/mL (ref 0.0–100.0)

## 2019-12-07 LAB — RESPIRATORY PANEL BY RT PCR (FLU A&B, COVID)
Influenza A by PCR: NEGATIVE
Influenza B by PCR: NEGATIVE
SARS Coronavirus 2 by RT PCR: NEGATIVE

## 2019-12-07 MED ORDER — AEROCHAMBER PLUS FLO-VU MEDIUM MISC
1.0000 | Freq: Once | Status: AC
  Administered 2019-12-07: 1
  Filled 2019-12-07: qty 1

## 2019-12-07 MED ORDER — POTASSIUM CHLORIDE CRYS ER 20 MEQ PO TBCR: 20.0000 meq | EXTENDED_RELEASE_TABLET | Freq: Every day | ORAL | 0 refills | Status: DC

## 2019-12-07 MED ORDER — METHYLPREDNISOLONE SODIUM SUCC 125 MG IJ SOLR
125.0000 mg | Freq: Once | INTRAMUSCULAR | Status: AC
  Administered 2019-12-07: 125 mg via INTRAVENOUS
  Filled 2019-12-07: qty 2

## 2019-12-07 MED ORDER — ALBUTEROL SULFATE HFA 108 (90 BASE) MCG/ACT IN AERS
2.0000 | INHALATION_SPRAY | Freq: Once | RESPIRATORY_TRACT | Status: AC
  Administered 2019-12-07: 2 via RESPIRATORY_TRACT
  Filled 2019-12-07: qty 6.7

## 2019-12-07 NOTE — ED Triage Notes (Signed)
Pt with hx asthma, chronic anemia, takes iron infusions , platelets replacement.  Using nebs at home with some relief but shob returns.  Last neb last night, took scheduled inhalers this morning.  Sat 96% on arrival rm air.

## 2019-12-07 NOTE — Telephone Encounter (Signed)
Patient sent to triage nurse.

## 2019-12-07 NOTE — Telephone Encounter (Signed)
Could you please get her scheduled for a virtual visit with a provider today? She will need to be seen to evaluate her symptoms.

## 2019-12-07 NOTE — ED Provider Notes (Signed)
[facility] EMERGENCY DEPARTMENT Provider Note   CSN: [id number] Arrival date & time: 12/07/19  7628     History Chief Complaint  Patient presents with  . Shortness of Breath    Rachael Buck is a 79 y.o. female.  Pt presents to the ED today with sob and cough.  She said her allergies have been bad.  She has asthma and has been using her nebulizers at home.  Pt had an iron infusion on 4/5 for chronic anemia.  Normally, she feels better after that, but has not this time.  Pt has had both covid vaccines.  She has not had a fever at home and no known covid exposures.          Past Medical History:  Diagnosis Date  . Anxiety   . Arthritis   . Asthma   . Cirrhosis, nonalcoholic ([place])   . Colon polyps   . Diabetes mellitus   . Diverticulitis   . GERD (gastroesophageal reflux disease)   . Hyperlipidemia   . Hypertension   . NASH (nonalcoholic steatohepatitis)   . Obesity   . Pneumonia     Patient Active Problem List   Diagnosis Date Noted  . Cirrhosis of liver ([place]) 02/17/2018  . 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 Thrombocytopenia ([place]) 02/15/2017  . Varicose veins of left lower extremity with complications 31/51/7616  . Age-related cognitive decline 06/02/2015  . Fatigue 07/19/2014  . Orthostatic hypotension 05/14/2013  . Bronchitis with asthma, acute 01/29/2013  . Nail abnormality 09/04/2012  . Bleeding disorder ([place]) 05/08/2012  . Varices, esophageal ([place]) 03/21/2012  . NASH (nonalcoholic steatohepatitis) 03/21/2012  . HTN (hypertension) 12/21/2011  . Depression 03/28/2011  . IBS (irritable bowel syndrome) 03/23/2011  . INTRINSIC ASTHMA, UNSPECIFIED 08/27/2010  . DYSPNEA ON EXERTION 07/27/2010  . Uncontrolled diabetes mellitus ([place]) 07/24/2010  . MIXED HYPERLIPIDEMIA 07/24/2010  . OBESITY, UNSPECIFIED 07/24/2010  . ALLERGIC RHINITIS CAUSE UNSPECIFIED 07/24/2010  . REFLUX ESOPHAGITIS 07/24/2010  . PAIN IN JOINT PELVIC REGION AND  THIGH 07/24/2010    Past Surgical History:  Procedure Laterality Date  . CHOLECYSTECTOMY  1981  . ENDOVENOUS ABLATION SAPHENOUS VEIN W/ LASER Left 02/21/2018   endovenous laser ablation left greater saphenous vein by Tinnie Gens MD   . [place]  . PARTIAL HYSTERECTOMY  1972  . ROTATOR CUFF REPAIR     bilateral. 2006, 2008  . 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 TOTAL KNEE ARTHROPLASTY Left 02/18/2017   Procedure: LEFT TOTAL KNEE ARTHROPLASTY;  Surgeon: Netta Cedars, MD;  Location: [facility];  Service: Orthopedics;  Laterality: Left;  Marland Kitchen VEIN SURGERY       OB History   No obstetric history on file.     Family History  Problem Relation Age of Onset  . Breast cancer Daughter   . Breast cancer Maternal Aunt   . Diabetes Brother   . Diabetes Sister   . Stomach cancer Mother   . Cancer Other   . GER disease Other   . Obesity Other     Social History   Tobacco Use  . Smoking status: Former Research scientist (life sciences)  . Smokeless tobacco: Never Used  Substance Use Topics  . Alcohol use: No  . Drug use: No    Home Medications Prior to Admission medications   Medication Sig Start Date End Date Taking? Authorizing Provider  albuterol (PROAIR HFA) 108 (90 Base) MCG/ACT inhaler Inhale 2 puffs into the lungs every 4 (four) hours  as needed for wheezing or shortness of breath. 09/02/16   Midge Minium, MD  ALPRAZolam Duanne Moron) 0.5 MG tablet TAKE 1 TABLET BY MOUTH THREE TIMES DAILY 09/10/19   Copland, Gay Filler, MD  colestipol (COLESTID) 1 g tablet Take 1 g by mouth daily.    [provider]  dicyclomine (BENTYL) 20 MG tablet Take 1 tablet (20 mg total) by mouth 3 (three) times daily as needed. 05/24/19   Copland, Gay Filler, MD  diphenhydrAMINE (BENADRYL) 25 MG tablet Take 25 mg by mouth every 6 (six) hours as needed (for allergies.).    [provider]  empagliflozin (JARDIANCE) 10 MG TABS tablet Take 10 mg by mouth daily. 11/26/19   Copland, Gay Filler, MD  glipiZIDE  (GLUCOTROL XL) 5 MG 24 hr tablet Take 1 tablet (5 mg total) by mouth daily with breakfast. 11/30/18   Copland, Gay Filler, MD  glucose blood (ONE TOUCH ULTRA TEST) test strip TEST BLOOD SUGARS ONCE DAILY IN THE MORNING 08/08/19   Copland, Gay Filler, MD  hydrocortisone 2.5 % cream Apply topically.    [provider]  ipratropium-albuterol (DUONEB) 0.5-2.5 (3) MG/3ML SOLN Take 3 mLs by nebulization every 6 (six) hours as needed. 01/16/18   Copland, Gay Filler, MD  loperamide (IMODIUM) 2 MG capsule Take 4-6 mg by mouth daily as needed (for IBS symptoms).    [provider]  Mometasone Furoate [facility] HFA) 100 MCG/ACT AERO Inhale 2 puffs into the lungs 2 (two) times daily. 02/13/18   Copland, Gay Filler, MD  omeprazole (PRILOSEC) 40 MG capsule TAKE 1 CAPSULE(40 MG) BY MOUTH DAILY 10/26/19   Copland, Gay Filler, MD  [facility] DELICA LANCETS FINE MISC 1 each by Other route daily. 05/18/18   Copland, Gay Filler, MD  potassium chloride SA (KLOR-CON M20) 20 MEQ tablet Take 1 tablet (20 mEq total) by mouth daily. 12/07/19   Isla Pence, MD  sertraline (ZOLOFT) 100 MG tablet Take 1 tablet (100 mg total) by mouth daily. 06/28/19   Copland, Gay Filler, MD  sertraline (ZOLOFT) 50 MG tablet TAKE 1 TABLET BY MOUTH EVERY DAY 07/16/19   Copland, Gay Filler, MD  simvastatin (ZOCOR) 80 MG tablet TAKE 1 TABLET BY MOUTH EVERY DAY 07/23/19   Copland, Gay Filler, MD  hydrochlorothiazide (HYDRODIURIL) 25 MG tablet Take 1 tablet (25 mg total) by mouth daily. 09/25/19 12/07/19  Copland, Gay Filler, MD    Allergies    Codeine, Metformin and related, and Shrimp [shellfish allergy]  Review of Systems   Review of Systems  Respiratory: Positive for cough, shortness of breath and wheezing.   All other systems reviewed and are negative.   Physical Exam Updated Vital Signs BP (!) 144/64 (BP Location: Right Arm)   Pulse 87   Temp 99.3 F (37.4 C) (Oral)   Resp 18   Ht 5' 6"  (1.676 m)   Wt 89.8 kg   SpO2 96%   BMI  31.96 kg/m   Physical Exam Vitals and nursing note reviewed.  Constitutional:      Appearance: She is well-developed. She is obese.  HENT:     Head: Normocephalic and atraumatic.     Mouth/Throat:     Mouth: Mucous membranes are moist.     Pharynx: Oropharynx is clear.  Eyes:     Extraocular Movements: Extraocular movements intact.     Pupils: Pupils are equal, round, and reactive to light.  Cardiovascular:     Rate and Rhythm: Normal rate and regular rhythm.  Pulmonary:     Effort: Pulmonary effort is normal.     Breath sounds: Wheezing present.  Abdominal:     General: Bowel sounds are normal.     Palpations: Abdomen is soft.  Musculoskeletal:        General: Normal range of motion.     Cervical back: Normal range of motion and neck supple.  Skin:    General: Skin is warm.     Capillary Refill: Capillary refill takes less than 2 seconds.  Neurological:     General: No focal deficit present.     Mental Status: She is alert and oriented to person, place, and time.  Psychiatric:        Mood and Affect: Mood normal.        Behavior: Behavior normal.     ED Results / Procedures / Treatments   Labs (all labs ordered are listed, but only abnormal results are displayed) Labs Reviewed  BASIC METABOLIC PANEL - Abnormal; Notable for the following components:      Result Value   Potassium 2.8 (*)    Chloride 95 (*)    Glucose, Bld 178 (*)    All other components within normal limits  CBC WITH DIFFERENTIAL/PLATELET - Abnormal; Notable for the following components:   RBC 3.82 (*)    Hemoglobin 9.7 (*)    HCT 30.6 (*)    MCH 25.4 (*)    RDW 17.0 (*)    Platelets 66 (*)    All other components within normal limits  RESPIRATORY PANEL BY RT PCR (FLU A&B, COVID)  BRAIN NATRIURETIC PEPTIDE  URINALYSIS, ROUTINE W REFLEX MICROSCOPIC  HEMOGLOBIN A1C  TROPONIN I (HIGH SENSITIVITY)  TROPONIN I (HIGH SENSITIVITY)    EKG EKG Interpretation  Date/Time:  Friday December 07 2019  10:11:56 EDT Ventricular Rate:  86 PR Interval:    QRS Duration: 148 QT Interval:  408 QTC Calculation: 488 R Axis:   72 Text Interpretation: Sinus rhythm Right bundle branch block No significant change since last tracing Confirmed by Isla Pence [id number]) on 12/07/2019 11:05:48 AM   Radiology DG Chest Port 1 View  Result Date: 12/07/2019 CLINICAL DATA:  Asthma. Shortness of breath. EXAM: PORTABLE CHEST 1 VIEW COMPARISON:  01/14/2018 FINDINGS: Heart size is normal. Chronic aortic atherosclerosis. There may be mild central bronchial thickening but there is no infiltrate, collapse or effusion. No edema. No acute bone finding. IMPRESSION: Possible mild central bronchial thickening. No consolidation or collapse. Aortic atherosclerosis. Electronically Signed   By: Nelson Chimes M.D.   On: 12/07/2019 10:43    Procedures Procedures (including critical care time)  Medications Ordered in ED Medications  methylPREDNISolone sodium succinate (SOLU-MEDROL) 125 mg/2 mL injection 125 mg (125 mg Intravenous Given 12/07/19 1103)  albuterol (VENTOLIN HFA) 108 (90 Base) MCG/ACT inhaler 2 puff (2 puffs Inhalation Given 12/07/19 1113)  AeroChamber Plus Flo-Vu Medium MISC 1 each (1 each Other Given 12/07/19 1113)    ED Course  I have reviewed the triage vital signs and the nursing notes.  Pertinent labs & imaging results that were available during my care of the patient were reviewed by me and considered in my medical decision making (see chart for details).    MDM Rules/Calculators/A&P                      Pt is feeling much better after albuterol and solumedrol.  She is diabetic and is told her blood sugar will elevate after the steroids  and to keep a close eye on her blood sugar and what she eats.  Pt does not need to go home on oral steroids as breathing was not too bad.  Pt's covid swab negative and CXR shows no pna.  Pt's K is low.  It's been low since October.  She is on hctz, so I will hold that and  put her on oral K for a week.  Pt's hgb and plts are stable.  Pt knows to return if worse.  F/u with pcp.  Final Clinical Impression(s) / ED Diagnoses Final diagnoses:  Mild intermittent asthma with exacerbation  Seasonal allergies  Hypokalemia    Rx / DC Orders ED Discharge Orders         Ordered    potassium chloride SA (KLOR-CON M20) 20 MEQ tablet  Daily     12/07/19 1319           Isla Pence, MD 12/07/19 1319

## 2019-12-07 NOTE — Telephone Encounter (Signed)
Caller : Rachael Buck  Call Back # [phone]  Patient states she is shortness of breath and patient has congestion. Patient states that she had an infusion of iron this week and it didn't make her feel any better.   Please advise

## 2019-12-08 LAB — HEMOGLOBIN A1C
Hgb A1c MFr Bld: 7.6 % — ABNORMAL HIGH (ref 4.8–5.6)
Mean Plasma Glucose: 171 mg/dL

## 2019-12-10 ENCOUNTER — Ambulatory Visit: Payer: PPO | Admitting: Family Medicine

## 2019-12-10 ENCOUNTER — Encounter: Payer: Self-pay | Admitting: Family Medicine

## 2019-12-10 ENCOUNTER — Other Ambulatory Visit: Payer: Self-pay

## 2019-12-11 ENCOUNTER — Inpatient Hospital Stay: Payer: PPO

## 2019-12-11 ENCOUNTER — Telehealth: Payer: Self-pay | Admitting: Hematology & Oncology

## 2019-12-11 NOTE — Telephone Encounter (Signed)
Called and spoke with patient regarding appointment being moved from 4/13 to 4/20 per 4/12 sch msg

## 2019-12-17 ENCOUNTER — Telehealth: Payer: Self-pay | Admitting: Family Medicine

## 2019-12-17 MED ORDER — DOXYCYCLINE HYCLATE 100 MG PO CAPS: 100.0000 mg | ORAL_CAPSULE | Freq: Two times a day (BID) | ORAL | 0 refills | Status: DC

## 2019-12-17 NOTE — Telephone Encounter (Signed)
I was doing a virtual visit for patient's husband today, when she asked me about antibiotic for herself.  Pt was in the ER about 10 days ago with concern of cough and was tested for covid 19- she was negative, continues to cough however.  Cough persistent for 3 weeks now US Abdomen Complete  Result Date: 11/27/2019 CLINICAL DATA:  Thrombocytopenia EXAM: ABDOMEN ULTRASOUND COMPLETE COMPARISON:  CT abdomen and pelvis June 28, 2019 FINDINGS: Gallbladder: Surgically absent. Common bile duct: Diameter: 5 mm. No intrahepatic, common hepatic, or common bile duct dilatation. Liver: No focal lesion identified. Liver has a nodular contour with a somewhat coarsened echotexture pattern. Liver measures approximately 14 cm in length. Portal vein is patent on color Doppler imaging with normal direction of blood flow towards the liver. IVC: No abnormality visualized. Pancreas: No pancreatic mass or inflammatory focus. Spleen: Spleen measures 16.0 x 13.2 x 7.9 cm with a measured splenic volume of 834 cubic cm. No focal splenic lesions are evident. Right Kidney: Length: 11.5 cm. Echogenicity within normal limits. No mass or hydronephrosis visualized. Left Kidney: Length: 11.9 cm. Echogenicity within normal limits. No mass or hydronephrosis visualized. Abdominal aorta: No aneurysm visualized. There is aortic atherosclerosis. Other findings: No evident ascites. IMPRESSION: 1. Liver contour is indicative of hepatic steatosis. Liver echogenicity is somewhat coarsened without focal liver lesion. Note that the sensitivity of ultrasound for detection of focal liver lesions is somewhat diminished in this circumstance. 2.  Splenomegaly.  No focal splenic lesions evident. 3.  Gallbladder absent. 4.  Aortic Atherosclerosis (ICD10-I70.0). Electronically Signed   By: Lowella Grip III M.D.   On: 11/27/2019 09:39   DG Chest Port 1 View  Result Date: 12/07/2019 CLINICAL DATA:  Asthma. Shortness of breath. EXAM: PORTABLE CHEST 1 VIEW  COMPARISON:  01/14/2018 FINDINGS: Heart size is normal. Chronic aortic atherosclerosis. There may be mild central bronchial thickening but there is no infiltrate, collapse or effusion. No edema. No acute bone finding. IMPRESSION: Possible mild central bronchial thickening. No consolidation or collapse. Aortic atherosclerosis. Electronically Signed   By: Nelson Chimes M.D.   On: 12/07/2019 10:43   No fevers  Her cough can be productive  I called her in a prescription for doxycycline 100, take twice a day for 10 days She will let me know if symptoms do not resolve

## 2019-12-18 ENCOUNTER — Inpatient Hospital Stay: Payer: PPO

## 2019-12-18 ENCOUNTER — Other Ambulatory Visit: Payer: Self-pay

## 2019-12-18 VITALS — BP 130/63 | HR 90 | Temp 97.6°F | Resp 17

## 2019-12-18 DIAGNOSIS — D696 Thrombocytopenia, unspecified: Secondary | ICD-10-CM | POA: Diagnosis not present

## 2019-12-18 MED ORDER — SODIUM CHLORIDE 0.9 % IV SOLN
Freq: Once | INTRAVENOUS | Status: AC
  Filled 2019-12-18: qty 250

## 2019-12-18 MED ORDER — SODIUM CHLORIDE 0.9 % IV SOLN
200.0000 mg | Freq: Once | INTRAVENOUS | Status: AC
  Administered 2019-12-18: 200 mg via INTRAVENOUS
  Filled 2019-12-18: qty 10

## 2019-12-18 NOTE — Patient Instructions (Signed)

## 2019-12-28 ENCOUNTER — Other Ambulatory Visit: Payer: Self-pay

## 2019-12-28 DIAGNOSIS — E119 Type 2 diabetes mellitus without complications: Secondary | ICD-10-CM

## 2019-12-28 MED ORDER — GLIPIZIDE ER 5 MG PO TB24: 5.0000 mg | ORAL_TABLET | Freq: Every day | ORAL | 3 refills | Status: DC

## 2019-12-31 DIAGNOSIS — R109 Unspecified abdominal pain: Secondary | ICD-10-CM | POA: Diagnosis not present

## 2019-12-31 DIAGNOSIS — A09 Infectious gastroenteritis and colitis, unspecified: Secondary | ICD-10-CM | POA: Diagnosis not present

## 2019-12-31 DIAGNOSIS — K7581 Nonalcoholic steatohepatitis (NASH): Secondary | ICD-10-CM | POA: Diagnosis not present

## 2019-12-31 DIAGNOSIS — K2100 Gastro-esophageal reflux disease with esophagitis, without bleeding: Secondary | ICD-10-CM | POA: Diagnosis not present

## 2020-01-22 ENCOUNTER — Other Ambulatory Visit: Payer: Self-pay

## 2020-01-22 MED ORDER — SIMVASTATIN 80 MG PO TABS: 80.0000 mg | ORAL_TABLET | Freq: Every day | ORAL | 1 refills | Status: DC

## 2020-01-22 MED ORDER — ALPRAZOLAM 0.5 MG PO TABS: 0.5000 mg | ORAL_TABLET | Freq: Three times a day (TID) | ORAL | 3 refills | Status: DC

## 2020-01-22 NOTE — Telephone Encounter (Signed)
Requesting:Alprazolam Contract:none [id number] Last Visit:06/28/2019 Next Visit:none Last Refill:09/10/2019  Please Advise

## 2020-01-24 ENCOUNTER — Ambulatory Visit (INDEPENDENT_AMBULATORY_CARE_PROVIDER_SITE_OTHER): Payer: PPO | Admitting: Family Medicine

## 2020-01-24 ENCOUNTER — Telehealth: Payer: Self-pay | Admitting: Family Medicine

## 2020-01-24 ENCOUNTER — Encounter: Payer: Self-pay | Admitting: Family Medicine

## 2020-01-24 ENCOUNTER — Other Ambulatory Visit: Payer: Self-pay

## 2020-01-24 VITALS — BP 136/66 | HR 86 | Temp 97.8°F | Resp 18 | Ht 66.0 in | Wt 193.0 lb

## 2020-01-24 DIAGNOSIS — R140 Abdominal distension (gaseous): Secondary | ICD-10-CM | POA: Diagnosis not present

## 2020-01-24 NOTE — Telephone Encounter (Signed)
Caller: Rachael Buck Call back phone [phone]  Rachael Buck would like to be seen today for swelling. There is no available appointment for today's patient does not want to see anyone else. Rachael Buck would like to speak to you.   Please advise.

## 2020-01-24 NOTE — Patient Instructions (Signed)
Good to see you today!  Please go to lab, and then to the ground floor to schedule your abd ultrasound to evaluate for a fluid collection in the belly.  Please seek immediate care if you are getting worse!   [place]

## 2020-01-24 NOTE — Telephone Encounter (Signed)
Patient coming in at 3:00 today.

## 2020-01-24 NOTE — Progress Notes (Addendum)
[facility] [place], [place], [place] [address] [phone]  Date:  01/24/2020   Name:  Rachael Buck   DOB:  1941-08-02   MRN:  [id number]  PCP:  [name], MD    Chief Complaint: Abdominal Swelling   History of Present Illness:  Rachael Buck is a 79 y.o. 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 Watt Climes- last visit about 4 weeks - at that time her sx were not as severe.  He felt it was likely gas-however since that time her symptoms have gotten worse  At her last visit in October she had also concern of abdominal tenderness and distention.  We did a CT scan which was negative  She had an iron infusion per hematology about a month ago Platelets 66,000 at that time Most recent LFTs in April normal, renal function is normal No vomiting She is able to eat ok normally however she has noted some weight loss-about 5 pounds, as below She may have diarrhea at times  She will use imodium as needed for diarrhea No fevers or chills No rashes No bleeding or unusual bruising  Wt Readings from Last 3 Encounters:  01/24/20 193 lb (87.5 kg)  12/07/19 198 lb (89.8 kg)  11/29/19 198 lb 1.9 oz (89.9 kg)      Patient Active Problem List   Diagnosis Date Noted  . Cirrhosis of liver ([place]) 02/17/2018  . 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 Thrombocytopenia ([place]) 02/15/2017  . Varicose veins of left lower extremity with complications 76/73/4193  . Age-related cognitive decline 06/02/2015  . Fatigue  07/19/2014  . Orthostatic hypotension 05/14/2013  . Bronchitis with asthma, acute 01/29/2013  . Nail abnormality 09/04/2012  . Bleeding disorder ([place]) 05/08/2012  . Varices, esophageal ([place]) 03/21/2012  . NASH (nonalcoholic steatohepatitis) 03/21/2012  . HTN (hypertension) 12/21/2011  . Depression 03/28/2011  . IBS (irritable bowel syndrome) 03/23/2011  . INTRINSIC ASTHMA, UNSPECIFIED 08/27/2010  . DYSPNEA ON EXERTION 07/27/2010  . Uncontrolled diabetes mellitus ([place]) 07/24/2010  . MIXED HYPERLIPIDEMIA 07/24/2010  . OBESITY, UNSPECIFIED 07/24/2010  . ALLERGIC RHINITIS CAUSE UNSPECIFIED 07/24/2010  . REFLUX ESOPHAGITIS 07/24/2010  . PAIN IN JOINT PELVIC REGION AND THIGH 07/24/2010    Past Medical History:  Diagnosis Date  . Anxiety   . Arthritis   . Asthma   . Cirrhosis, nonalcoholic ([place])   . Colon polyps   . Diabetes mellitus   . Diverticulitis   . GERD (gastroesophageal reflux disease)   . Hyperlipidemia   . Hypertension   . NASH (nonalcoholic steatohepatitis)   . Obesity   . Pneumonia     Past Surgical History:  Procedure Laterality Date  . CHOLECYSTECTOMY  1981  . ENDOVENOUS ABLATION SAPHENOUS VEIN W/ LASER Left 02/21/2018   endovenous laser ablation left greater saphenous vein by Tinnie Gens MD   . [facility]  . PARTIAL HYSTERECTOMY  1972  . ROTATOR CUFF REPAIR     bilateral. 2006, 2008  . 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  TOTAL KNEE ARTHROPLASTY Left 02/18/2017   Procedure: LEFT TOTAL KNEE ARTHROPLASTY;  Surgeon: Netta Cedars, MD;  Location: [facility];  Service: Orthopedics;  Laterality: Left;  Marland Kitchen VEIN SURGERY      Social History   Tobacco Use  . Smoking status: Former Research scientist (life sciences)  . Smokeless tobacco: Never Used  Substance Use Topics  . Alcohol use: No  . Drug use: No    Family History  Problem Relation Age of Onset  . Breast cancer Daughter   . Breast cancer Maternal Aunt   . Diabetes Brother   . Diabetes Sister   . Stomach cancer  Mother   . Cancer Other   . GER disease Other   . Obesity Other     Allergies  Allergen Reactions  . Codeine Nausea And Vomiting  . Metformin And Related Diarrhea  . Shrimp [Shellfish Allergy] Swelling    Medication list has been reviewed and updated.  Current Outpatient Medications on File Prior to Visit  Medication Sig Dispense Refill  . albuterol (PROAIR HFA) 108 (90 Base) MCG/ACT inhaler Inhale 2 puffs into the lungs every 4 (four) hours as needed for wheezing or shortness of breath. 1 Inhaler 6  . ALPRAZolam (XANAX) 0.5 MG tablet Take 1 tablet (0.5 mg total) by mouth 3 (three) times daily. 90 tablet 3  . colestipol (COLESTID) 1 g tablet Take 1 g by mouth daily.    Marland Kitchen dicyclomine (BENTYL) 20 MG tablet Take 1 tablet (20 mg total) by mouth 3 (three) times daily as needed. 360 tablet 0  . diphenhydrAMINE (BENADRYL) 25 MG tablet Take 25 mg by mouth every 6 (six) hours as needed (for allergies.).    Marland Kitchen doxycycline (VIBRAMYCIN) 100 MG capsule Take 1 capsule (100 mg total) by mouth 2 (two) times daily. 20 capsule 0  . empagliflozin (JARDIANCE) 10 MG TABS tablet Take 10 mg by mouth daily. 90 tablet 3  . glipiZIDE (GLUCOTROL XL) 5 MG 24 hr tablet Take 1 tablet (5 mg total) by mouth daily with breakfast. 90 tablet 3  . glucose blood (ONE TOUCH ULTRA TEST) test strip TEST BLOOD SUGARS ONCE DAILY IN THE MORNING 100 each 1  . hydrocortisone 2.5 % cream Apply topically.    Marland Kitchen ipratropium-albuterol (DUONEB) 0.5-2.5 (3) MG/3ML SOLN Take 3 mLs by nebulization every 6 (six) hours as needed. 360 mL 2  . loperamide (IMODIUM) 2 MG capsule Take 4-6 mg by mouth daily as needed (for IBS symptoms).    . Mometasone Furoate (ASMANEX HFA) 100 MCG/ACT AERO Inhale 2 puffs into the lungs 2 (two) times daily. 1 Inhaler 12  . omeprazole (PRILOSEC) 40 MG capsule TAKE 1 CAPSULE(40 MG) BY MOUTH DAILY 90 capsule 1  . ONETOUCH DELICA LANCETS FINE MISC 1 each by Other route daily. 100 each 12  . potassium chloride SA  (KLOR-CON M20) 20 MEQ tablet Take 1 tablet (20 mEq total) by mouth daily. 14 tablet 0  . sertraline (ZOLOFT) 100 MG tablet Take 1 tablet (100 mg total) by mouth daily. 90 tablet 3  . sertraline (ZOLOFT) 50 MG tablet TAKE 1 TABLET BY MOUTH EVERY DAY 90 tablet 1  . simvastatin (ZOCOR) 80 MG tablet Take 1 tablet (80 mg total) by mouth daily. 90 tablet 1  . [DISCONTINUED] hydrochlorothiazide (HYDRODIURIL) 25 MG tablet Take 1 tablet (25 mg total) by mouth daily. 90 tablet 1   No current facility-administered medications on file prior to visit.    Review of Systems:  As per HPI- otherwise negative.  Physical Examination: Vitals:   01/24/20 1439  BP: 136/66  Pulse: 86  Resp: 18  Temp: 97.8 F (36.6 C)  SpO2: 94%   Vitals:   01/24/20 1439  Weight: 193 lb (87.5 kg)  Height: 5' 6"  (1.676 m)   Body mass index is 31.15 kg/m. Ideal Body Weight: Weight in (lb) to have BMI = 25: 154.6  GEN: no acute distress.  Obese, looks well  HEENT: Atraumatic, Normocephalic.   Bilateral TM wnl, oropharynx normal.  PEERL,EOMI.   Ears and Nose: No external deformity. CV: RRR, No M/G/R. No JVD. No thrill. No extra heart sounds. PULM: CTA B, no wheezes, crackles, rhonchi. No retractions. No resp. distress. No accessory muscle use. ABD: S, +BS. No rebound. No HSM.  Abdomen is soft and displays minimal, nonfocal tenderness to palpation.  It is moderately distended EXTR: No c/c/e PSYCH: Normally interactive. Conversant.   Assessment and Plan: Abdominal distention - Plan: CBC, Comprehensive metabolic panel, US Abdomen Complete  Patient here today with concern of abdominal distention.  This has been going on for about 6 weeks, gradually getting worse.  No acute symptoms today.  We actually saw her for a similar issue about 6 months ago. Today we will obtain CBC and c-Met, plan for abdominal ultrasound tomorrow.  Cannot be done today as she is not fasting The patient is advised to seek immediate help if  she has any significant worsening or other concerns.  She states understanding of and agreement with the plan  Moderate medical decision making today  This visit occurred during the SARS-CoV-2 public health emergency.  Safety protocols were in place, including screening questions prior to the visit, additional usage of staff PPE, and extensive cleaning of exam room while observing appropriate contact time as indicated for disinfecting solutions.     Signed Lamar Blinks, MD  Received her labs 5/28- message to pt  Results for orders placed or performed in visit on 01/24/20  CBC  Result Value Ref Range   WBC 5.2 4.0 - 10.5 K/uL   RBC 4.35 3.87 - 5.11 Mil/uL   Platelets 55.0 Repeated and verified X2. (L) 150.0 - 400.0 K/uL   Hemoglobin 11.2 (L) 12.0 - 15.0 g/dL   HCT 34.1 (L) 36.0 - 46.0 %   MCV 78.5 78.0 - 100.0 fl   MCHC 32.8 30.0 - 36.0 g/dL   RDW 20.0 (H) 11.5 - 15.5 %  Comprehensive metabolic panel  Result Value Ref Range   Sodium 139 135 - 145 mEq/L   Potassium 3.2 (L) 3.5 - 5.1 mEq/L   Chloride 95 (L) 96 - 112 mEq/L   CO2 34 (H) 19 - 32 mEq/L   Glucose, Bld 142 (H) 70 - 99 mg/dL   BUN 12 6 - 23 mg/dL   Creatinine, Ser 0.66 0.40 - 1.20 mg/dL   Total Bilirubin 0.7 0.2 - 1.2 mg/dL   Alkaline Phosphatase 55 39 - 117 U/L   AST 28 0 - 37 U/L   ALT 15 0 - 35 U/L   Total Protein 7.4 6.0 - 8.3 g/dL   Albumin 4.4 3.5 - 5.2 g/dL   GFR 86.70 >60.00 mL/min   Calcium 9.6 8.4 - 10.5 mg/dL

## 2020-01-24 NOTE — Telephone Encounter (Signed)
Work in or schedule with another provider?

## 2020-01-25 ENCOUNTER — Encounter: Payer: Self-pay | Admitting: Family Medicine

## 2020-01-25 LAB — COMPREHENSIVE METABOLIC PANEL
ALT: 15 U/L (ref 0–35)
AST: 28 U/L (ref 0–37)
Albumin: 4.4 g/dL (ref 3.5–5.2)
Alkaline Phosphatase: 55 U/L (ref 39–117)
BUN: 12 mg/dL (ref 6–23)
CO2: 34 mEq/L — ABNORMAL HIGH (ref 19–32)
Calcium: 9.6 mg/dL (ref 8.4–10.5)
Chloride: 95 mEq/L — ABNORMAL LOW (ref 96–112)
Creatinine, Ser: 0.66 mg/dL (ref 0.40–1.20)
GFR: 86.7 mL/min (ref >60.00)
Glucose, Bld: 142 mg/dL — ABNORMAL HIGH (ref 70–99)
Potassium: 3.2 mEq/L — ABNORMAL LOW (ref 3.5–5.1)
Sodium: 139 mEq/L (ref 135–145)
Total Bilirubin: 0.7 mg/dL (ref 0.2–1.2)
Total Protein: 7.4 g/dL (ref 6.0–8.3)

## 2020-01-25 LAB — CBC
HCT: 34.1 % — ABNORMAL LOW (ref 36.0–46.0)
Hemoglobin: 11.2 g/dL — ABNORMAL LOW (ref 12.0–15.0)
MCHC: 32.8 g/dL (ref 30.0–36.0)
MCV: 78.5 fl (ref 78.0–100.0)
Platelets: 55 10*3/uL — ABNORMAL LOW (ref 150.0–400.0)
RBC: 4.35 Mil/uL (ref 3.87–5.11)
RDW: 20 % — ABNORMAL HIGH (ref 11.5–15.5)
WBC: 5.2 10*3/uL (ref 4.0–10.5)

## 2020-01-29 ENCOUNTER — Other Ambulatory Visit: Payer: Self-pay | Admitting: Family Medicine

## 2020-01-29 ENCOUNTER — Ambulatory Visit (HOSPITAL_BASED_OUTPATIENT_CLINIC_OR_DEPARTMENT_OTHER)
Admission: RE | Admit: 2020-01-29 | Discharge: 2020-01-29 | Disposition: A | Discharge status: Home / Self Care | Payer: PPO | Source: Ambulatory Visit | Attending: Family Medicine | Admitting: Family Medicine

## 2020-01-29 ENCOUNTER — Other Ambulatory Visit: Payer: Self-pay

## 2020-01-29 DIAGNOSIS — R140 Abdominal distension (gaseous): Secondary | ICD-10-CM

## 2020-01-30 ENCOUNTER — Encounter: Payer: Self-pay | Admitting: Family Medicine

## 2020-01-30 ENCOUNTER — Ambulatory Visit: Payer: PPO | Admitting: Family Medicine

## 2020-01-31 ENCOUNTER — Encounter: Payer: Self-pay | Admitting: Family Medicine

## 2020-01-31 MED ORDER — POTASSIUM CHLORIDE CRYS ER 20 MEQ PO TBCR: 20.0000 meq | EXTENDED_RELEASE_TABLET | Freq: Every day | ORAL | 3 refills | Status: DC

## 2020-01-31 NOTE — Telephone Encounter (Signed)
Talk with patient today, she was seen with her husband who had a visit.  We discussed her recent ultrasound, no ascites are apparent.  She feels that her symptoms are about the same, no abdominal pain but she just feels like her belly is bloated.  She does have a gastroenterology appointment scheduled for September.  I offered to do a CT or other further evaluation, for the time being C[place] would like to observe, she will let me know if things are changing or getting worse.  I did suggest that she try some over-the-counter Gas-X to see if it might help with her symptoms

## 2020-03-04 ENCOUNTER — Other Ambulatory Visit: Payer: Self-pay

## 2020-03-04 MED ORDER — DICYCLOMINE HCL 20 MG PO TABS: 20.0000 mg | ORAL_TABLET | Freq: Three times a day (TID) | ORAL | 0 refills | Status: DC | PRN

## 2020-03-07 ENCOUNTER — Encounter: Payer: Self-pay | Admitting: Family

## 2020-03-07 ENCOUNTER — Other Ambulatory Visit: Payer: Self-pay

## 2020-03-07 ENCOUNTER — Inpatient Hospital Stay: Payer: PPO | Attending: Family

## 2020-03-07 ENCOUNTER — Telehealth: Payer: Self-pay | Admitting: Family

## 2020-03-07 ENCOUNTER — Inpatient Hospital Stay (HOSPITAL_BASED_OUTPATIENT_CLINIC_OR_DEPARTMENT_OTHER): Payer: PPO | Admitting: Family

## 2020-03-07 VITALS — BP 132/62 | HR 94 | Temp 98.9°F | Resp 20 | Wt 196.0 lb

## 2020-03-07 DIAGNOSIS — Z79899 Other long term (current) drug therapy: Secondary | ICD-10-CM | POA: Diagnosis not present

## 2020-03-07 DIAGNOSIS — D500 Iron deficiency anemia secondary to blood loss (chronic): Secondary | ICD-10-CM

## 2020-03-07 DIAGNOSIS — K7581 Nonalcoholic steatohepatitis (NASH): Secondary | ICD-10-CM | POA: Insufficient documentation

## 2020-03-07 DIAGNOSIS — D696 Thrombocytopenia, unspecified: Secondary | ICD-10-CM | POA: Diagnosis not present

## 2020-03-07 DIAGNOSIS — R140 Abdominal distension (gaseous): Secondary | ICD-10-CM | POA: Insufficient documentation

## 2020-03-07 DIAGNOSIS — R0602 Shortness of breath: Secondary | ICD-10-CM | POA: Diagnosis not present

## 2020-03-07 DIAGNOSIS — R161 Splenomegaly, not elsewhere classified: Secondary | ICD-10-CM | POA: Insufficient documentation

## 2020-03-07 DIAGNOSIS — Z7984 Long term (current) use of oral hypoglycemic drugs: Secondary | ICD-10-CM | POA: Diagnosis not present

## 2020-03-07 DIAGNOSIS — D509 Iron deficiency anemia, unspecified: Secondary | ICD-10-CM | POA: Insufficient documentation

## 2020-03-07 DIAGNOSIS — R5383 Other fatigue: Secondary | ICD-10-CM | POA: Diagnosis not present

## 2020-03-07 LAB — CMP (CANCER CENTER ONLY)
ALT: 13 U/L (ref 0–44)
AST: 22 U/L (ref 15–41)
Albumin: 4.4 g/dL (ref 3.5–5.0)
Alkaline Phosphatase: 50 U/L (ref 38–126)
Anion gap: 9 (ref 5–15)
BUN: 14 mg/dL (ref 8–23)
CO2: 33 mmol/L — ABNORMAL HIGH (ref 22–32)
Calcium: 9.6 mg/dL (ref 8.9–10.3)
Chloride: 97 mmol/L — ABNORMAL LOW (ref 98–111)
Creatinine: 0.64 mg/dL (ref 0.44–1.00)
GFR, Est AFR Am: >60 mL/min (ref >60)
GFR, Estimated: >60 mL/min (ref >60)
Glucose, Bld: 273 mg/dL — ABNORMAL HIGH (ref 70–99)
Potassium: 3.4 mmol/L — ABNORMAL LOW (ref 3.5–5.1)
Sodium: 139 mmol/L (ref 135–145)
Total Bilirubin: 0.9 mg/dL (ref 0.3–1.2)
Total Protein: 7.2 g/dL (ref 6.5–8.1)

## 2020-03-07 LAB — CBC WITH DIFFERENTIAL (CANCER CENTER ONLY)
Abs Immature Granulocytes: 0.03 10*3/uL (ref 0.00–0.07)
Basophils Absolute: 0.1 10*3/uL (ref 0.0–0.1)
Basophils Relative: 1 %
Eosinophils Absolute: 0 10*3/uL (ref 0.0–0.5)
Eosinophils Relative: 0 %
HCT: 32.2 % — ABNORMAL LOW (ref 36.0–46.0)
Hemoglobin: 9.9 g/dL — ABNORMAL LOW (ref 12.0–15.0)
Immature Granulocytes: 1 %
Lymphocytes Relative: 22 %
Lymphs Abs: 1.1 10*3/uL (ref 0.7–4.0)
MCH: 24.6 pg — ABNORMAL LOW (ref 26.0–34.0)
MCHC: 30.7 g/dL (ref 30.0–36.0)
MCV: 80.1 fL (ref 80.0–100.0)
Monocytes Absolute: 0.5 10*3/uL (ref 0.1–1.0)
Monocytes Relative: 9 %
Neutro Abs: 3.4 10*3/uL (ref 1.7–7.7)
Neutrophils Relative %: 67 %
Platelet Count: 58 10*3/uL — ABNORMAL LOW (ref 150–400)
RBC: 4.02 MIL/uL (ref 3.87–5.11)
RDW: 17.3 % — ABNORMAL HIGH (ref 11.5–15.5)
WBC Count: 5.1 10*3/uL (ref 4.0–10.5)
nRBC: 0 % (ref 0.0–0.2)

## 2020-03-07 LAB — FERRITIN: Ferritin: 8 ng/mL — ABNORMAL LOW (ref 11–307)

## 2020-03-07 LAB — RETICULOCYTES
Immature Retic Fract: 23.4 % — ABNORMAL HIGH (ref 2.3–15.9)
RBC.: 3.95 MIL/uL (ref 3.87–5.11)
Retic Count, Absolute: 101.9 10*3/uL (ref 19.0–186.0)
Retic Ct Pct: 2.6 % (ref 0.4–3.1)

## 2020-03-07 LAB — IRON AND TIBC
Iron: 44 ug/dL (ref 41–142)
Saturation Ratios: 9 % — ABNORMAL LOW (ref 21–57)
TIBC: 484 ug/dL — ABNORMAL HIGH (ref 236–444)
UIBC: 440 ug/dL — ABNORMAL HIGH (ref 120–384)

## 2020-03-07 NOTE — Telephone Encounter (Signed)
Appointments scheduled calendar printed & mailed per 7/9 los

## 2020-03-07 NOTE — Progress Notes (Signed)
Hematology and Oncology Follow Up Visit  Rachael Buck [id number] [birth year] 79 y.o. 03/07/2020   Principle Diagnosis:  Thrombocytopenia -- NASH/Splenomegaly ; Immune based thrombocytopenia Iron deficiency   Current Therapy: Nplate as indicated  IV iron as indicated    Interim History:  Rachael Buck is here today with her daughter for follow-up. She has been having issues with fatigue and abdominal bloating.  She had an abdominal US last month which didn't show any ascites. She states that her GI doctor feels like this is related to gas and she is trying Gasex.  We discussed her diet and avoiding fatty, fried foods and keeping a food diary. She will also try papaya extract.  Her platelet count today is 55, Hgb 9.9 and WBC count 5.1.  She denies any episodes of bleeding. No bruising or petechiae noted.  She will follow-up with her PCP for CT scan of the abdomen if she feels it is needed.  She has some SOB with over exertion and will take a break to rest as needed.  No fever, chills, n/v, cough, rash, dizziness, SOB, chest pain, palpitations or changes in bowel or bladder habits.  No swelling, numbness or tingling in her extremities at this time.  She has generalized joint aches and pain all over that come and go.  No falls or syncopal episodes to report.  She has maintained a good appetite and is staying well hydrated. Her weight is stable.   ECOG Performance Status: 1 - Symptomatic but completely ambulatory  Medications:  Allergies as of 03/07/2020      Reactions   Codeine Nausea And Vomiting   Metformin And Related Diarrhea   Shrimp [shellfish Allergy] Swelling      Medication List       Accurate as of March 07, 2020 10:41 AM. If you have any questions, ask your nurse or doctor.        STOP taking these medications   doxycycline 100 MG capsule Commonly known as: VIBRAMYCIN Stopped by: Rachael Peace, NP     TAKE these medications   albuterol 108 (90 Base)  MCG/ACT inhaler Commonly known as: ProAir HFA Inhale 2 puffs into the lungs every 4 (four) hours as needed for wheezing or shortness of breath.   ALPRAZolam 0.5 MG tablet Commonly known as: XANAX Take 1 tablet (0.5 mg total) by mouth 3 (three) times daily.   colestipol 1 g tablet Commonly known as: COLESTID Take 1 g by mouth daily.   dicyclomine 20 MG tablet Commonly known as: BENTYL Take 1 tablet (20 mg total) by mouth 3 (three) times daily as needed.   diphenhydrAMINE 25 MG tablet Commonly known as: BENADRYL Take 25 mg by mouth every 6 (six) hours as needed (for allergies.).   empagliflozin 10 MG Tabs tablet Commonly known as: JARDIANCE Take 10 mg by mouth daily.   glipiZIDE 5 MG 24 hr tablet Commonly known as: GLUCOTROL XL Take 1 tablet (5 mg total) by mouth daily with breakfast.   glucose blood test strip Commonly known as: ONE TOUCH ULTRA TEST TEST BLOOD SUGARS ONCE DAILY IN THE MORNING   hydrochlorothiazide 25 MG tablet Commonly known as: HYDRODIURIL Take 25 mg by mouth daily.   hydrocortisone 2.5 % cream Apply topically.   ipratropium-albuterol 0.5-2.5 (3) MG/3ML Soln Commonly known as: DUONEB Take 3 mLs by nebulization every 6 (six) hours as needed.   loperamide 2 MG capsule Commonly known as: IMODIUM Take 4-6 mg by mouth daily as needed (for IBS  symptoms).   Melatonin 2.5 MG Caps Take 2.5 mg by mouth at bedtime as needed. Take two capsules, total of 5 mg as needed at bedtime.   Mometasone Furoate 100 MCG/ACT Aero Commonly known as: Asmanex HFA Inhale 2 puffs into the lungs 2 (two) times daily.   omeprazole 40 MG capsule Commonly known as: PRILOSEC TAKE 1 CAPSULE(40 MG) BY MOUTH DAILY   OneTouch Delica Lancets Fine Misc 1 each by Other route daily.   potassium chloride SA 20 MEQ tablet Commonly known as: Klor-Con M20 Take 1 tablet (20 mEq total) by mouth daily.   sertraline 100 MG tablet Commonly known as: ZOLOFT Take 1 tablet (100 mg total)  by mouth daily. What changed: Another medication with the same name was removed. Continue taking this medication, and follow the directions you see here. Changed by: Rachael Peace, NP   simvastatin 80 MG tablet Commonly known as: ZOCOR Take 1 tablet (80 mg total) by mouth daily.       Allergies:  Allergies  Allergen Reactions  . Codeine Nausea And Vomiting  . Metformin And Related Diarrhea  . Shrimp [Shellfish Allergy] Swelling    Past Medical History, Surgical history, Social history, and Family History were reviewed and updated.  Review of Systems: All other 10 point review of systems is negative.   Physical Exam:  weight is 196 lb (88.9 kg). Her oral temperature is 98.9 F (37.2 C). Her blood pressure is 132/62 and her pulse is 94. Her respiration is 20 and oxygen saturation is 97%.   Wt Readings from Last 3 Encounters:  03/07/20 196 lb (88.9 kg)  01/24/20 193 lb (87.5 kg)  12/07/19 198 lb (89.8 kg)    Ocular: Sclerae unicteric, pupils equal, round and reactive to light Ear-nose-throat: Oropharynx clear, dentition fair Lymphatic: No cervical or supraclavicular adenopathy Lungs no rales or rhonchi, good excursion bilaterally Heart regular rate and rhythm, no murmur appreciated Abd soft, nontender, positive bowel sounds, no liver or spleen tip palpated on exam, no fluid wave  MSK no focal spinal tenderness, no joint edema Neuro: non-focal, well-oriented, appropriate affect Breasts: Deferred   Lab Results  Component Value Date   WBC 5.1 03/07/2020   HGB 9.9 (L) 03/07/2020   HCT 32.2 (L) 03/07/2020   MCV 80.1 03/07/2020   PLT 58 (L) 03/07/2020   Lab Results  Component Value Date   FERRITIN 8 (L) 11/29/2019   IRON 34 (L) 11/29/2019   TIBC 511 (H) 11/29/2019   UIBC 477 (H) 11/29/2019   IRONPCTSAT 7 (L) 11/29/2019   Lab Results  Component Value Date   RETICCTPCT 2.6 03/07/2020   RBC 3.95 03/07/2020   No results found for: KPAFRELGTCHN, LAMBDASER,  KAPLAMBRATIO No results found for: IGGSERUM, IGA, IGMSERUM No results found for: [place]det[place] Pink, SPEI   Chemistry      Component Value Date/Time   NA 139 03/07/2020 0921   NA 127 (L) 02/14/2017 1335   K 3.4 (L) 03/07/2020 0921   K 3.6 02/14/2017 1335   CL 97 (L) 03/07/2020 0921   CL 92 (L) 02/14/2017 1335   CO2 33 (H) 03/07/2020 0921   CO2 28 02/14/2017 1335   BUN 14 03/07/2020 0921   BUN 7 (L) 02/14/2017 1335   CREATININE 0.64 03/07/2020 0921   CREATININE 0.47 (L) 02/14/2017 1335   CREATININE 0.56 07/24/2014 1204      Component Value Date/Time   CALCIUM 9.6 03/07/2020 0921   CALCIUM 9.3 02/14/2017  1335   ALKPHOS 50 03/07/2020 0921   ALKPHOS 68 02/14/2017 1335   AST 22 03/07/2020 0921   ALT 13 03/07/2020 0921   BILITOT 0.9 03/07/2020 0921       Impression and Plan: Ms. 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 [place] this week showed the spleen and liver to be stable.  Her platelet count is stable at 55 , no Nplate at this time per Dr. Marin Olp.  We will see what her iron studies look like and replace if needed.  We will plan to see her back in another 4 months.  She will contact our office with any questions or concerns. We can certainly see him sooner if needed.   Rachael Peace, NP 7/9/202110:41 AM

## 2020-03-10 ENCOUNTER — Telehealth: Payer: Self-pay | Admitting: Family

## 2020-03-10 NOTE — Telephone Encounter (Signed)
I called and spoke with patient regarding infusion appointments that have been added.  She was ok with all dates/times per 7/9 sch msg

## 2020-03-12 ENCOUNTER — Inpatient Hospital Stay: Payer: PPO

## 2020-03-12 ENCOUNTER — Other Ambulatory Visit: Payer: Self-pay

## 2020-03-12 VITALS — BP 122/57 | HR 84 | Temp 98.6°F | Resp 17

## 2020-03-12 DIAGNOSIS — D696 Thrombocytopenia, unspecified: Secondary | ICD-10-CM | POA: Diagnosis not present

## 2020-03-12 MED ORDER — SODIUM CHLORIDE 0.9 % IV SOLN
Freq: Once | INTRAVENOUS | Status: AC
  Filled 2020-03-12: qty 250

## 2020-03-12 MED ORDER — SODIUM CHLORIDE 0.9 % IV SOLN
200.0000 mg | Freq: Once | INTRAVENOUS | Status: AC
  Administered 2020-03-12: 200 mg via INTRAVENOUS
  Filled 2020-03-12: qty 200

## 2020-03-14 ENCOUNTER — Inpatient Hospital Stay: Payer: PPO

## 2020-03-14 ENCOUNTER — Other Ambulatory Visit: Payer: Self-pay

## 2020-03-14 VITALS — BP 111/73 | HR 83 | Temp 98.5°F | Resp 17

## 2020-03-14 DIAGNOSIS — D696 Thrombocytopenia, unspecified: Secondary | ICD-10-CM | POA: Diagnosis not present

## 2020-03-14 MED ORDER — SODIUM CHLORIDE 0.9 % IV SOLN
Freq: Once | INTRAVENOUS | Status: AC
  Filled 2020-03-14: qty 250

## 2020-03-14 MED ORDER — SODIUM CHLORIDE 0.9 % IV SOLN
200.0000 mg | Freq: Once | INTRAVENOUS | Status: AC
  Administered 2020-03-14: 200 mg via INTRAVENOUS
  Filled 2020-03-14: qty 200

## 2020-03-14 NOTE — Patient Instructions (Signed)

## 2020-03-14 NOTE — Progress Notes (Signed)
Pt declined to stay for post infusion observation period. Pt stated she has tolerated medication prior without difficulty. Pt aware to call clinic with any questions or concerns. Pt verbalized understanding and had no further questions.

## 2020-03-17 ENCOUNTER — Other Ambulatory Visit: Payer: Self-pay

## 2020-03-17 ENCOUNTER — Inpatient Hospital Stay: Payer: PPO

## 2020-03-17 VITALS — BP 117/86 | HR 93 | Temp 98.0°F | Resp 16

## 2020-03-17 DIAGNOSIS — D696 Thrombocytopenia, unspecified: Secondary | ICD-10-CM | POA: Diagnosis not present

## 2020-03-17 MED ORDER — SODIUM CHLORIDE 0.9 % IV SOLN
200.0000 mg | Freq: Once | INTRAVENOUS | Status: AC
  Administered 2020-03-17: 200 mg via INTRAVENOUS
  Filled 2020-03-17: qty 10

## 2020-03-17 MED ORDER — SODIUM CHLORIDE 0.9 % IV SOLN
Freq: Once | INTRAVENOUS | Status: AC
  Filled 2020-03-17: qty 250

## 2020-03-17 NOTE — Progress Notes (Signed)
No Venofer left in bag at the time of IV infiltration. Heat pack applied to IV site after d/c.  IV not restarted. Pt declined 30 minute post-infusion observation.

## 2020-03-17 NOTE — Patient Instructions (Signed)

## 2020-03-19 ENCOUNTER — Inpatient Hospital Stay: Payer: PPO

## 2020-03-19 ENCOUNTER — Other Ambulatory Visit: Payer: Self-pay

## 2020-03-19 VITALS — BP 112/56 | HR 84 | Temp 98.2°F | Resp 17

## 2020-03-19 DIAGNOSIS — D696 Thrombocytopenia, unspecified: Secondary | ICD-10-CM | POA: Diagnosis not present

## 2020-03-19 MED ORDER — SODIUM CHLORIDE 0.9 % IV SOLN
200.0000 mg | Freq: Once | INTRAVENOUS | Status: AC
  Administered 2020-03-19: 200 mg via INTRAVENOUS
  Filled 2020-03-19: qty 10

## 2020-03-19 MED ORDER — SODIUM CHLORIDE 0.9 % IV SOLN
Freq: Once | INTRAVENOUS | Status: AC
  Filled 2020-03-19: qty 250

## 2020-03-19 NOTE — Progress Notes (Signed)
Pt declined to stay for post infusion observation period. Pt stated she has tolerated medication multiple times prior without difficulty. Pt aware to call clinic with any questions or concerns. Pt verbalized understanding and had no further questions.  ? ?

## 2020-03-19 NOTE — Patient Instructions (Signed)

## 2020-03-21 ENCOUNTER — Other Ambulatory Visit: Payer: Self-pay

## 2020-03-21 ENCOUNTER — Inpatient Hospital Stay: Payer: PPO

## 2020-03-21 VITALS — BP 127/53 | HR 94 | Temp 98.2°F | Resp 18

## 2020-03-21 DIAGNOSIS — D696 Thrombocytopenia, unspecified: Secondary | ICD-10-CM

## 2020-03-21 MED ORDER — SODIUM CHLORIDE 0.9 % IV SOLN
200.0000 mg | Freq: Once | INTRAVENOUS | Status: AC
  Administered 2020-03-21: 200 mg via INTRAVENOUS
  Filled 2020-03-21: qty 200

## 2020-03-21 MED ORDER — SODIUM CHLORIDE 0.9 % IV SOLN
Freq: Once | INTRAVENOUS | Status: AC
  Filled 2020-03-21: qty 250

## 2020-03-21 NOTE — Patient Instructions (Signed)

## 2020-04-27 ENCOUNTER — Other Ambulatory Visit: Payer: Self-pay | Admitting: Family Medicine

## 2020-04-28 ENCOUNTER — Inpatient Hospital Stay (HOSPITAL_BASED_OUTPATIENT_CLINIC_OR_DEPARTMENT_OTHER): Payer: PPO | Admitting: Family

## 2020-04-28 ENCOUNTER — Inpatient Hospital Stay: Payer: PPO | Attending: Family

## 2020-04-28 ENCOUNTER — Ambulatory Visit: Payer: PPO | Admitting: Family

## 2020-04-28 ENCOUNTER — Telehealth: Payer: Self-pay | Admitting: *Deleted

## 2020-04-28 ENCOUNTER — Other Ambulatory Visit: Payer: Self-pay

## 2020-04-28 ENCOUNTER — Other Ambulatory Visit: Payer: Self-pay | Admitting: *Deleted

## 2020-04-28 ENCOUNTER — Other Ambulatory Visit: Payer: PPO

## 2020-04-28 ENCOUNTER — Encounter: Payer: Self-pay | Admitting: Family

## 2020-04-28 VITALS — BP 142/75 | HR 95 | Temp 97.8°F | Resp 18 | Ht 66.0 in | Wt 191.0 lb

## 2020-04-28 DIAGNOSIS — D500 Iron deficiency anemia secondary to blood loss (chronic): Secondary | ICD-10-CM

## 2020-04-28 DIAGNOSIS — R161 Splenomegaly, not elsewhere classified: Secondary | ICD-10-CM | POA: Insufficient documentation

## 2020-04-28 DIAGNOSIS — K7581 Nonalcoholic steatohepatitis (NASH): Secondary | ICD-10-CM

## 2020-04-28 DIAGNOSIS — D696 Thrombocytopenia, unspecified: Secondary | ICD-10-CM | POA: Diagnosis not present

## 2020-04-28 DIAGNOSIS — R197 Diarrhea, unspecified: Secondary | ICD-10-CM | POA: Insufficient documentation

## 2020-04-28 DIAGNOSIS — R5383 Other fatigue: Secondary | ICD-10-CM | POA: Diagnosis not present

## 2020-04-28 LAB — CMP (CANCER CENTER ONLY)
ALT: 15 U/L (ref 0–44)
AST: 26 U/L (ref 15–41)
Albumin: 4.4 g/dL (ref 3.5–5.0)
Alkaline Phosphatase: 51 U/L (ref 38–126)
Anion gap: 9 (ref 5–15)
BUN: 17 mg/dL (ref 8–23)
CO2: 31 mmol/L (ref 22–32)
Calcium: 9.7 mg/dL (ref 8.9–10.3)
Chloride: 100 mmol/L (ref 98–111)
Creatinine: 0.62 mg/dL (ref 0.44–1.00)
GFR, Est AFR Am: >60 mL/min (ref >60)
GFR, Estimated: >60 mL/min (ref >60)
Glucose, Bld: 229 mg/dL — ABNORMAL HIGH (ref 70–99)
Potassium: 3.5 mmol/L (ref 3.5–5.1)
Sodium: 140 mmol/L (ref 135–145)
Total Bilirubin: 0.9 mg/dL (ref 0.3–1.2)
Total Protein: 7.2 g/dL (ref 6.5–8.1)

## 2020-04-28 LAB — CBC WITH DIFFERENTIAL (CANCER CENTER ONLY)
Abs Immature Granulocytes: 0.05 10*3/uL (ref 0.00–0.07)
Basophils Absolute: 0 10*3/uL (ref 0.0–0.1)
Basophils Relative: 1 %
Eosinophils Absolute: 0 10*3/uL (ref 0.0–0.5)
Eosinophils Relative: 1 %
HCT: 36.7 % (ref 36.0–46.0)
Hemoglobin: 12.1 g/dL (ref 12.0–15.0)
Immature Granulocytes: 1 %
Lymphocytes Relative: 21 %
Lymphs Abs: 0.9 10*3/uL (ref 0.7–4.0)
MCH: 27.5 pg (ref 26.0–34.0)
MCHC: 33 g/dL (ref 30.0–36.0)
MCV: 83.4 fL (ref 80.0–100.0)
Monocytes Absolute: 0.3 10*3/uL (ref 0.1–1.0)
Monocytes Relative: 6 %
Neutro Abs: 3 10*3/uL (ref 1.7–7.7)
Neutrophils Relative %: 70 %
Platelet Count: 60 10*3/uL — ABNORMAL LOW (ref 150–400)
RBC: 4.4 MIL/uL (ref 3.87–5.11)
RDW: 18.2 % — ABNORMAL HIGH (ref 11.5–15.5)
WBC Count: 4.2 10*3/uL (ref 4.0–10.5)
nRBC: 0 % (ref 0.0–0.2)

## 2020-04-28 LAB — SAMPLE TO BLOOD BANK

## 2020-04-28 LAB — IRON AND TIBC
Iron: 113 ug/dL (ref 41–142)
Saturation Ratios: 28 % (ref 21–57)
TIBC: 399 ug/dL (ref 236–444)
UIBC: 286 ug/dL (ref 120–384)

## 2020-04-28 LAB — SAVE SMEAR(SSMR), FOR PROVIDER SLIDE REVIEW

## 2020-04-28 LAB — FERRITIN: Ferritin: 40 ng/mL (ref 11–307)

## 2020-04-28 NOTE — Telephone Encounter (Signed)
Call received from patient stating that she has been having black stools for the last three days, this morning had a large amount of black liquid stool times three and would like to know if she can come in today to have her platelets checked.  Dr. Marin Olp notified.  Order received from Dr. Marin Olp for pt to come in now for lab work and to be seen by NP.  Patient notified and transferred to scheduling.

## 2020-04-28 NOTE — Progress Notes (Signed)
Hematology and Oncology Follow Up Visit  Rachael Buck [id number] [birth year] 79 y.o. 04/28/2020   Principle Diagnosis:  Thrombocytopenia -- NASH/Splenomegaly ; Immune based thrombocytopenia Iron deficiency  Current Therapy: Nplateas indicated  IV iron as indicated   Interim History:  Rachael Buck is here today for follow-up. She has noted dark, tarry loose stool the last couple days.  No bruising or petechiae. Platelet count is stable at 60 and Hgb has improved to 12.1 after IV iron.  She still notes fatigue.  She has SOB at times with over exertion which she states may be due to asthma.  She denies fever, chills, n/v, cough, rash, dizziness, chest pain, palpitations,abdominal pain or changes in bladder habits.  No swelling, tenderness, numbness or tingling I her extremities.  She fell when bending over to turn out a lamp recently. She states that she got up too quickly and has to wait longer to stand first due to orthostatic hypotension.  No other falls or syncope.  She has maintained a good appetite and is staying well hydrated. Her weight is stable.   ECOG Performance Status: 1 - Symptomatic but completely ambulatory  Medications:  Allergies as of 04/28/2020      Reactions   Codeine Nausea And Vomiting   Metformin And Related Diarrhea   Shrimp [shellfish Allergy] Swelling      Medication List       Accurate as of April 28, 2020 11:25 AM. If you have any questions, ask your nurse or doctor.        albuterol 108 (90 Base) MCG/ACT inhaler Commonly known as: ProAir HFA Inhale 2 puffs into the lungs every 4 (four) hours as needed for wheezing or shortness of breath.   ALPRAZolam 0.5 MG tablet Commonly known as: XANAX Take 1 tablet (0.5 mg total) by mouth 3 (three) times daily.   colestipol 1 g tablet Commonly known as: COLESTID Take 1 g by mouth daily.   dicyclomine 20 MG tablet Commonly known as: BENTYL Take 1 tablet (20 mg total) by mouth 3  (three) times daily as needed.   diphenhydrAMINE 25 MG tablet Commonly known as: BENADRYL Take 25 mg by mouth every 6 (six) hours as needed (for allergies.).   empagliflozin 10 MG Tabs tablet Commonly known as: JARDIANCE Take 10 mg by mouth daily.   glipiZIDE 5 MG 24 hr tablet Commonly known as: GLUCOTROL XL Take 1 tablet (5 mg total) by mouth daily with breakfast.   glucose blood test strip Commonly known as: ONE TOUCH ULTRA TEST TEST BLOOD SUGARS ONCE DAILY IN THE MORNING   hydrochlorothiazide 25 MG tablet Commonly known as: HYDRODIURIL Take 25 mg by mouth daily.   hydrocortisone 2.5 % cream Apply topically.   ipratropium-albuterol 0.5-2.5 (3) MG/3ML Soln Commonly known as: DUONEB Take 3 mLs by nebulization every 6 (six) hours as needed.   loperamide 2 MG capsule Commonly known as: IMODIUM Take 4-6 mg by mouth daily as needed (for IBS symptoms).   Melatonin 2.5 MG Caps Take 2.5 mg by mouth at bedtime as needed. Take two capsules, total of 5 mg as needed at bedtime.   Mometasone Furoate 100 MCG/ACT Aero Commonly known as: Asmanex HFA Inhale 2 puffs into the lungs 2 (two) times daily.   omeprazole 40 MG capsule Commonly known as: PRILOSEC TAKE 1 CAPSULE(40 MG) BY MOUTH DAILY   OneTouch Delica Lancets Fine Misc 1 each by Other route daily.   potassium chloride SA 20 MEQ tablet Commonly known as: Klor-Con  M20 Take 1 tablet (20 mEq total) by mouth daily.   sertraline 100 MG tablet Commonly known as: ZOLOFT Take 1 tablet (100 mg total) by mouth daily.   simvastatin 80 MG tablet Commonly known as: ZOCOR Take 1 tablet (80 mg total) by mouth daily.       Allergies:  Allergies  Allergen Reactions  . Codeine Nausea And Vomiting  . Metformin And Related Diarrhea  . Shrimp [Shellfish Allergy] Swelling    Past Medical History, Surgical history, Social history, and Family History were reviewed and updated.  Review of Systems: All other 10 point review of  systems is negative.   Physical Exam:  height is 5' 6"  (1.676 m) and weight is 191 lb (86.6 kg). Her oral temperature is 97.8 F (36.6 C). Her blood pressure is 142/75 (abnormal) and her pulse is 95. Her respiration is 18 and oxygen saturation is 96%.   Wt Readings from Last 3 Encounters:  04/28/20 191 lb (86.6 kg)  03/07/20 196 lb (88.9 kg)  01/24/20 193 lb (87.5 kg)    Ocular: Sclerae unicteric, pupils equal, round and reactive to light Ear-nose-throat: Oropharynx clear, dentition fair Lymphatic: No cervical or supraclavicular adenopathy Lungs no rales or rhonchi, good excursion bilaterally Heart regular rate and rhythm, no murmur appreciated Abd soft, nontender, positive bowel sounds, no liver or spleen tip palpated on exam, no fluid wave  MSK no focal spinal tenderness, no joint edema Neuro: non-focal, well-oriented, appropriate affect Breasts: Deferred  Lab Results  Component Value Date   WBC 4.2 04/28/2020   HGB 12.1 04/28/2020   HCT 36.7 04/28/2020   MCV 83.4 04/28/2020   PLT 60 (L) 04/28/2020   Lab Results  Component Value Date   FERRITIN 8 (L) 03/07/2020   IRON 44 03/07/2020   TIBC 484 (H) 03/07/2020   UIBC 440 (H) 03/07/2020   IRONPCTSAT 9 (L) 03/07/2020   Lab Results  Component Value Date   RETICCTPCT 2.6 03/07/2020   RBC 4.40 04/28/2020   No results found for: KPAFRELGTCHN, LAMBDASER, KAPLAMBRATIO No results found for: IGGSERUM, IGA, IGMSERUM No results found for: Odetta Pink, SPEI   Chemistry      Component Value Date/Time   NA 140 04/28/2020 1019   NA 127 (L) 02/14/2017 1335   K 3.5 04/28/2020 1019   K 3.6 02/14/2017 1335   CL 100 04/28/2020 1019   CL 92 (L) 02/14/2017 1335   CO2 31 04/28/2020 1019   CO2 28 02/14/2017 1335   BUN 17 04/28/2020 1019   BUN 7 (L) 02/14/2017 1335   CREATININE 0.62 04/28/2020 1019   CREATININE 0.47 (L) 02/14/2017 1335   CREATININE 0.56 07/24/2014 1204        Component Value Date/Time   CALCIUM 9.7 04/28/2020 1019   CALCIUM 9.3 02/14/2017 1335   ALKPHOS 51 04/28/2020 1019   ALKPHOS 68 02/14/2017 1335   AST 26 04/28/2020 1019   ALT 15 04/28/2020 1019   BILITOT 0.9 04/28/2020 1019       Impression and Plan: Rachael Buck is a very pleasant 78 yo caucasian female with 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  She is here today with c/o dark, tarry stools for the last couple days.  She would prefer to take stool cards home and bring back in later this week.  We will see what her iron studies look like and replace if needed.  We will keep her follow-up appointment in November.  She can contact  our office with any questions or concerns.   Laverna Peace, NP 8/30/202111:25 AM

## 2020-04-30 ENCOUNTER — Other Ambulatory Visit: Payer: PPO

## 2020-04-30 ENCOUNTER — Ambulatory Visit: Payer: PPO | Admitting: Family

## 2020-05-01 ENCOUNTER — Other Ambulatory Visit: Payer: Self-pay

## 2020-05-01 ENCOUNTER — Inpatient Hospital Stay: Payer: PPO | Attending: Family

## 2020-05-01 DIAGNOSIS — D500 Iron deficiency anemia secondary to blood loss (chronic): Secondary | ICD-10-CM | POA: Diagnosis not present

## 2020-05-01 DIAGNOSIS — K921 Melena: Secondary | ICD-10-CM | POA: Insufficient documentation

## 2020-05-01 DIAGNOSIS — D696 Thrombocytopenia, unspecified: Secondary | ICD-10-CM | POA: Insufficient documentation

## 2020-05-01 LAB — OCCULT BLOOD X 1 CARD TO LAB, STOOL
Fecal Occult Bld: POSITIVE — AB
Fecal Occult Bld: POSITIVE — AB
Fecal Occult Bld: POSITIVE — AB

## 2020-05-02 ENCOUNTER — Other Ambulatory Visit: Payer: Self-pay | Admitting: Family

## 2020-05-02 ENCOUNTER — Telehealth: Payer: Self-pay | Admitting: *Deleted

## 2020-05-02 DIAGNOSIS — K921 Melena: Secondary | ICD-10-CM

## 2020-05-02 NOTE — Telephone Encounter (Addendum)
Patient is aware of results. She sees Dr Watt Climes and would like the info sent to him. Sarah notified.   ----- Message from Eliezer Bottom, NP sent at 05/02/2020 12:33 PM EDT ----- Iron studies stable, no IV iron needed at this time but her stool is positive for blood. I have referred her to GI for further work up.   ----- Message ----- From: Buel Ream, [facility] Sent: 05/01/2020   8:09 PM EDT To: Eliezer Bottom, NP

## 2020-05-23 DIAGNOSIS — K7581 Nonalcoholic steatohepatitis (NASH): Secondary | ICD-10-CM | POA: Diagnosis not present

## 2020-05-23 DIAGNOSIS — K921 Melena: Secondary | ICD-10-CM | POA: Diagnosis not present

## 2020-05-23 DIAGNOSIS — Z86010 Personal history of colonic polyps: Secondary | ICD-10-CM | POA: Diagnosis not present

## 2020-05-23 DIAGNOSIS — D131 Benign neoplasm of stomach: Secondary | ICD-10-CM | POA: Diagnosis not present

## 2020-05-23 DIAGNOSIS — R932 Abnormal findings on diagnostic imaging of liver and biliary tract: Secondary | ICD-10-CM | POA: Diagnosis not present

## 2020-06-09 ENCOUNTER — Other Ambulatory Visit: Payer: Self-pay | Admitting: Family Medicine

## 2020-06-09 NOTE — Telephone Encounter (Signed)
Last written:05/06/20 Last ov:01/31/20 Next ov: Contract:12/04/14 UDS:12/04/14

## 2020-07-02 ENCOUNTER — Inpatient Hospital Stay: Payer: PPO

## 2020-07-02 ENCOUNTER — Encounter: Payer: Self-pay | Admitting: Hematology & Oncology

## 2020-07-02 ENCOUNTER — Inpatient Hospital Stay: Payer: PPO | Attending: Family

## 2020-07-02 ENCOUNTER — Other Ambulatory Visit: Payer: Self-pay

## 2020-07-02 ENCOUNTER — Inpatient Hospital Stay (HOSPITAL_BASED_OUTPATIENT_CLINIC_OR_DEPARTMENT_OTHER): Payer: PPO | Admitting: Hematology & Oncology

## 2020-07-02 VITALS — BP 140/70 | HR 92 | Temp 98.1°F | Resp 19 | Ht 66.0 in | Wt 192.0 lb

## 2020-07-02 DIAGNOSIS — R161 Splenomegaly, not elsewhere classified: Secondary | ICD-10-CM | POA: Insufficient documentation

## 2020-07-02 DIAGNOSIS — K7581 Nonalcoholic steatohepatitis (NASH): Secondary | ICD-10-CM | POA: Insufficient documentation

## 2020-07-02 DIAGNOSIS — R1012 Left upper quadrant pain: Secondary | ICD-10-CM | POA: Diagnosis not present

## 2020-07-02 DIAGNOSIS — D509 Iron deficiency anemia, unspecified: Secondary | ICD-10-CM | POA: Insufficient documentation

## 2020-07-02 DIAGNOSIS — D696 Thrombocytopenia, unspecified: Secondary | ICD-10-CM

## 2020-07-02 DIAGNOSIS — Z79899 Other long term (current) drug therapy: Secondary | ICD-10-CM | POA: Diagnosis not present

## 2020-07-02 DIAGNOSIS — D500 Iron deficiency anemia secondary to blood loss (chronic): Secondary | ICD-10-CM

## 2020-07-02 LAB — CMP (CANCER CENTER ONLY)
ALT: 12 U/L (ref 0–44)
AST: 23 U/L (ref 15–41)
Albumin: 4.5 g/dL (ref 3.5–5.0)
Alkaline Phosphatase: 53 U/L (ref 38–126)
Anion gap: 7 (ref 5–15)
BUN: 16 mg/dL (ref 8–23)
CO2: 35 mmol/L — ABNORMAL HIGH (ref 22–32)
Calcium: 10.1 mg/dL (ref 8.9–10.3)
Chloride: 96 mmol/L — ABNORMAL LOW (ref 98–111)
Creatinine: 0.62 mg/dL (ref 0.44–1.00)
GFR, Estimated: >60 mL/min (ref >60)
Glucose, Bld: 178 mg/dL — ABNORMAL HIGH (ref 70–99)
Potassium: 3.4 mmol/L — ABNORMAL LOW (ref 3.5–5.1)
Sodium: 138 mmol/L (ref 135–145)
Total Bilirubin: 1 mg/dL (ref 0.3–1.2)
Total Protein: 7.6 g/dL (ref 6.5–8.1)

## 2020-07-02 LAB — CBC WITH DIFFERENTIAL (CANCER CENTER ONLY)
Abs Immature Granulocytes: 0.02 10*3/uL (ref 0.00–0.07)
Basophils Absolute: 0.1 10*3/uL (ref 0.0–0.1)
Basophils Relative: 1 %
Eosinophils Absolute: 0 10*3/uL (ref 0.0–0.5)
Eosinophils Relative: 1 %
HCT: 38.4 % (ref 36.0–46.0)
Hemoglobin: 12.6 g/dL (ref 12.0–15.0)
Immature Granulocytes: 0 %
Lymphocytes Relative: 22 %
Lymphs Abs: 1.5 10*3/uL (ref 0.7–4.0)
MCH: 27.6 pg (ref 26.0–34.0)
MCHC: 32.8 g/dL (ref 30.0–36.0)
MCV: 84 fL (ref 80.0–100.0)
Monocytes Absolute: 0.6 10*3/uL (ref 0.1–1.0)
Monocytes Relative: 10 %
Neutro Abs: 4.4 10*3/uL (ref 1.7–7.7)
Neutrophils Relative %: 66 %
Platelet Count: 70 10*3/uL — ABNORMAL LOW (ref 150–400)
RBC: 4.57 MIL/uL (ref 3.87–5.11)
RDW: 16.5 % — ABNORMAL HIGH (ref 11.5–15.5)
WBC Count: 6.5 10*3/uL (ref 4.0–10.5)
nRBC: 0 % (ref 0.0–0.2)

## 2020-07-02 LAB — RETICULOCYTES
Immature Retic Fract: 12.4 % (ref 2.3–15.9)
RBC.: 4.51 MIL/uL (ref 3.87–5.11)
Retic Count, Absolute: 100.6 10*3/uL (ref 19.0–186.0)
Retic Ct Pct: 2.2 % (ref 0.4–3.1)

## 2020-07-02 NOTE — Progress Notes (Signed)
Hematology and Oncology Follow Up Visit  Rachael Buck [id number] [birth year] 79 y.o. 07/02/2020   Principle Diagnosis:  Thrombocytopenia -- NASH/Splenomegaly ; Immune based thrombocytopenia Iron deficiency  Current Therapy: Nplateas indicated  IV iron as indicated   Interim History:  [name] is here today for follow-up. She looks pretty good. She does feel tired. I am not sure exactly why she would feel tired. We are checking her iron levels. When she was last here, her ferritin was 40 with an iron saturation of 28%.  She is complaining of some pain in the left upper quadrant. She has had this for a couple months. The pain does not radiate. Is not associated with nausea or vomiting. It has not had any change with bowel or bladder habits.  I think we probably need to do a CT scan on her. The last CT scan was done a year ago.  She has had no bleeding. She has not said nothing about her stool being melenic.  She has had no leg swelling. She has had no problems with fever. She has had no cough or shortness of breath.  Her blood sugar is still on the higher side. She is trying to keep control of her blood sugars.  Overall, I would say her performance status is ECOG 1.   Medications:  Allergies as of 07/02/2020      Reactions   Codeine Nausea And Vomiting   Metformin And Related Diarrhea   Shrimp [shellfish Allergy] Swelling      Medication List       Accurate as of July 02, 2020 12:31 PM. If you have any questions, ask your nurse or doctor.        albuterol 108 (90 Base) MCG/ACT inhaler Commonly known as: ProAir HFA Inhale 2 puffs into the lungs every 4 (four) hours as needed for wheezing or shortness of breath.   ALPRAZolam 0.5 MG tablet Commonly known as: XANAX TAKE 1 TABLET(0.5 MG) BY MOUTH THREE TIMES DAILY   colestipol 1 g tablet Commonly known as: COLESTID Take 1 g by mouth daily.   dicyclomine 20 MG tablet Commonly known as:  BENTYL Take 1 tablet (20 mg total) by mouth 3 (three) times daily as needed.   diphenhydrAMINE 25 MG tablet Commonly known as: BENADRYL Take 25 mg by mouth every 6 (six) hours as needed (for allergies.).   empagliflozin 10 MG Tabs tablet Commonly known as: JARDIANCE Take 10 mg by mouth daily.   glipiZIDE 5 MG 24 hr tablet Commonly known as: GLUCOTROL XL Take 1 tablet (5 mg total) by mouth daily with breakfast.   glucose blood test strip Commonly known as: ONE TOUCH ULTRA TEST TEST BLOOD SUGARS ONCE DAILY IN THE MORNING   hydrochlorothiazide 25 MG tablet Commonly known as: HYDRODIURIL TAKE 1 TABLET(25 MG) BY MOUTH DAILY   hydrocortisone 2.5 % cream Apply topically.   ipratropium-albuterol 0.5-2.5 (3) MG/3ML Soln Commonly known as: DUONEB Take 3 mLs by nebulization every 6 (six) hours as needed.   loperamide 2 MG capsule Commonly known as: IMODIUM Take 4-6 mg by mouth daily as needed (for IBS symptoms).   Melatonin 2.5 MG Caps Take 2.5 mg by mouth at bedtime as needed. Take two capsules, total of 5 mg as needed at bedtime.   Mometasone Furoate 100 MCG/ACT Aero Commonly known as: Asmanex HFA Inhale 2 puffs into the lungs 2 (two) times daily.   omeprazole 40 MG capsule Commonly known as: PRILOSEC TAKE 1 CAPSULE(40 MG) BY  MOUTH DAILY   OneTouch Delica Lancets Fine Misc 1 each by Other route daily.   potassium chloride SA 20 MEQ tablet Commonly known as: Klor-Con M20 Take 1 tablet (20 mEq total) by mouth daily.   sertraline 100 MG tablet Commonly known as: ZOLOFT Take 1 tablet (100 mg total) by mouth daily.   simvastatin 80 MG tablet Commonly known as: ZOCOR Take 1 tablet (80 mg total) by mouth daily.       Allergies:  Allergies  Allergen Reactions  . Codeine Nausea And Vomiting  . Metformin And Related Diarrhea  . Shrimp [Shellfish Allergy] Swelling    Past Medical History, Surgical history, Social history, and Family History were reviewed and  updated.  Review of Systems: Review of Systems  Constitutional: Positive for malaise/fatigue.  HENT: Negative.   Eyes: Negative.   Respiratory: Negative.   Cardiovascular: Negative.   Gastrointestinal: Positive for abdominal pain.  Genitourinary: Negative.   Musculoskeletal: Negative.   Skin: Negative.   Neurological: Negative.   Endo/Heme/Allergies: Negative.   Psychiatric/Behavioral: Negative.      Physical Exam:  height is 5' 6"  (1.676 m) and weight is 192 lb 0.6 oz (87.1 kg). Her oral temperature is 98.1 F (36.7 C). Her blood pressure is 140/70 and her pulse is 92. Her respiration is 19 and oxygen saturation is 95%.   Wt Readings from Last 3 Encounters:  07/02/20 192 lb 0.6 oz (87.1 kg)  04/28/20 191 lb (86.6 kg)  03/07/20 196 lb (88.9 kg)    Physical Exam Vitals reviewed.  HENT:     Head: Normocephalic and atraumatic.  Eyes:     Pupils: Pupils are equal, round, and reactive to light.  Cardiovascular:     Rate and Rhythm: Normal rate and regular rhythm.     Heart sounds: Normal heart sounds.  Pulmonary:     Effort: Pulmonary effort is normal.     Breath sounds: Normal breath sounds.  Abdominal:     General: Bowel sounds are normal.     Palpations: Abdomen is soft.  Musculoskeletal:        General: No tenderness or deformity. Normal range of motion.     Cervical back: Normal range of motion.  Lymphadenopathy:     Cervical: No cervical adenopathy.  Skin:    General: Skin is warm and dry.     Findings: No erythema or rash.  Neurological:     Mental Status: She is alert and oriented to person, place, and time.  Psychiatric:        Behavior: Behavior normal.        Thought Content: Thought content normal.        Judgment: Judgment normal.      Lab Results  Component Value Date   WBC 6.5 07/02/2020   HGB 12.6 07/02/2020   HCT 38.4 07/02/2020   MCV 84.0 07/02/2020   PLT 70 (L) 07/02/2020   Lab Results  Component Value Date   FERRITIN 40 04/28/2020    IRON 113 04/28/2020   TIBC 399 04/28/2020   UIBC 286 04/28/2020   IRONPCTSAT 28 04/28/2020   Lab Results  Component Value Date   RETICCTPCT 2.2 07/02/2020   RBC 4.51 07/02/2020   RBC 4.57 07/02/2020   No results found for: KPAFRELGTCHN, LAMBDASER, KAPLAMBRATIO No results found for: IGGSERUM, IGA, IGMSERUM No results found for: TOTALPROTELP, ALBUMINELP, A1GS, A2GS, BETS, BETA2SER, GAMS, MSPIKE, SPEI   Chemistry      Component Value Date/Time   NA 138  07/02/2020 1143   NA 127 (L) 02/14/2017 1335   K 3.4 (L) 07/02/2020 1143   K 3.6 02/14/2017 1335   CL 96 (L) 07/02/2020 1143   CL 92 (L) 02/14/2017 1335   CO2 35 (H) 07/02/2020 1143   CO2 28 02/14/2017 1335   BUN 16 07/02/2020 1143   BUN 7 (L) 02/14/2017 1335   CREATININE 0.62 07/02/2020 1143   CREATININE 0.47 (L) 02/14/2017 1335   CREATININE 0.56 07/24/2014 1204      Component Value Date/Time   CALCIUM 10.1 07/02/2020 1143   CALCIUM 9.3 02/14/2017 1335   ALKPHOS 53 07/02/2020 1143   ALKPHOS 68 02/14/2017 1335   AST 23 07/02/2020 1143   ALT 12 07/02/2020 1143   BILITOT 1.0 07/02/2020 1143      Impression and Plan: 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   Her platelet count is actually better today.  I am not sure why she is having this pain in the left upper quadrant. I really cannot feel her spleen. When I examined her abdomen, there may be some hepatomegaly.  I do think that a CT scan would be reasonable. We will get one set up for her next week.  She does not need Nplate. Usually we give her Nplate if she is going to have surgery.  I do want to follow her up in about a month or so. I want to make sure that we follow-up with this abdominal pain just to make sure nothing else is going on with her.  We will see what her iron studies show.  [name], MD 11/3/202112:31 PM

## 2020-07-03 ENCOUNTER — Telehealth: Payer: Self-pay | Admitting: Hematology & Oncology

## 2020-07-03 LAB — FERRITIN: Ferritin: 17 ng/mL (ref 11–307)

## 2020-07-03 LAB — IRON AND TIBC
Iron: 92 ug/dL (ref 41–142)
Saturation Ratios: 20 % — ABNORMAL LOW (ref 21–57)
TIBC: 461 ug/dL — ABNORMAL HIGH (ref 236–444)
UIBC: 369 ug/dL (ref 120–384)

## 2020-07-03 NOTE — Telephone Encounter (Signed)
Appointments scheduled calendar printed & mailed per 11/3 los 

## 2020-07-07 ENCOUNTER — Encounter: Payer: Self-pay | Admitting: *Deleted

## 2020-07-08 ENCOUNTER — Ambulatory Visit: Payer: PPO

## 2020-07-08 ENCOUNTER — Ambulatory Visit: Payer: PPO | Admitting: Hematology & Oncology

## 2020-07-08 ENCOUNTER — Other Ambulatory Visit: Payer: PPO

## 2020-07-10 ENCOUNTER — Ambulatory Visit (HOSPITAL_BASED_OUTPATIENT_CLINIC_OR_DEPARTMENT_OTHER)
Admission: RE | Admit: 2020-07-10 | Discharge: 2020-07-10 | Disposition: A | Discharge status: Home / Self Care | Payer: PPO | Source: Ambulatory Visit | Attending: Hematology & Oncology | Admitting: Hematology & Oncology

## 2020-07-10 ENCOUNTER — Other Ambulatory Visit: Payer: Self-pay

## 2020-07-10 ENCOUNTER — Encounter (HOSPITAL_BASED_OUTPATIENT_CLINIC_OR_DEPARTMENT_OTHER): Payer: Self-pay

## 2020-07-10 DIAGNOSIS — K7689 Other specified diseases of liver: Secondary | ICD-10-CM | POA: Diagnosis not present

## 2020-07-10 DIAGNOSIS — K7581 Nonalcoholic steatohepatitis (NASH): Secondary | ICD-10-CM | POA: Diagnosis not present

## 2020-07-10 DIAGNOSIS — I864 Gastric varices: Secondary | ICD-10-CM | POA: Diagnosis not present

## 2020-07-10 DIAGNOSIS — K7460 Unspecified cirrhosis of liver: Secondary | ICD-10-CM | POA: Diagnosis not present

## 2020-07-10 DIAGNOSIS — K753 Granulomatous hepatitis, not elsewhere classified: Secondary | ICD-10-CM | POA: Diagnosis not present

## 2020-07-10 MED ORDER — IOHEXOL 300 MG/ML  SOLN
100.0000 mL | Freq: Once | INTRAMUSCULAR | Status: AC | PRN
  Administered 2020-07-10: 100 mL via INTRAVENOUS

## 2020-07-11 ENCOUNTER — Telehealth: Payer: Self-pay | Admitting: *Deleted

## 2020-07-11 NOTE — Telephone Encounter (Signed)
Called pt and discussed results. Pt verbalized understanding, no further concerns at this time.

## 2020-07-11 NOTE — Telephone Encounter (Signed)
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  Rachael Buck

## 2020-07-14 ENCOUNTER — Telehealth: Payer: Self-pay

## 2020-07-14 DIAGNOSIS — F32A Depression, unspecified: Secondary | ICD-10-CM

## 2020-07-14 NOTE — Telephone Encounter (Signed)
Patient called requesting refill on Sertraline 100 MG.  [facility] [address]

## 2020-07-15 MED ORDER — SERTRALINE HCL 100 MG PO TABS: 100.0000 mg | ORAL_TABLET | Freq: Every day | ORAL | 3 refills | Status: DC

## 2020-07-15 NOTE — Telephone Encounter (Signed)
Patients refill sent to pharmacy.

## 2020-07-27 ENCOUNTER — Other Ambulatory Visit: Payer: Self-pay | Admitting: Family Medicine

## 2020-08-08 ENCOUNTER — Inpatient Hospital Stay (HOSPITAL_BASED_OUTPATIENT_CLINIC_OR_DEPARTMENT_OTHER): Payer: PPO | Admitting: Hematology & Oncology

## 2020-08-08 ENCOUNTER — Other Ambulatory Visit: Payer: Self-pay

## 2020-08-08 ENCOUNTER — Encounter: Payer: Self-pay | Admitting: Hematology & Oncology

## 2020-08-08 ENCOUNTER — Inpatient Hospital Stay: Payer: PPO | Attending: Family

## 2020-08-08 VITALS — BP 125/109 | HR 86 | Temp 97.9°F | Resp 20 | Wt 193.8 lb

## 2020-08-08 DIAGNOSIS — D696 Thrombocytopenia, unspecified: Secondary | ICD-10-CM | POA: Diagnosis not present

## 2020-08-08 DIAGNOSIS — Z79899 Other long term (current) drug therapy: Secondary | ICD-10-CM | POA: Diagnosis not present

## 2020-08-08 DIAGNOSIS — R161 Splenomegaly, not elsewhere classified: Secondary | ICD-10-CM | POA: Diagnosis not present

## 2020-08-08 DIAGNOSIS — K7581 Nonalcoholic steatohepatitis (NASH): Secondary | ICD-10-CM | POA: Diagnosis not present

## 2020-08-08 DIAGNOSIS — R1012 Left upper quadrant pain: Secondary | ICD-10-CM | POA: Diagnosis not present

## 2020-08-08 LAB — CMP (CANCER CENTER ONLY)
ALT: 12 U/L (ref 0–44)
AST: 20 U/L (ref 15–41)
Albumin: 4.3 g/dL (ref 3.5–5.0)
Alkaline Phosphatase: 52 U/L (ref 38–126)
Anion gap: 9 (ref 5–15)
BUN: 15 mg/dL (ref 8–23)
CO2: 34 mmol/L — ABNORMAL HIGH (ref 22–32)
Calcium: 9.6 mg/dL (ref 8.9–10.3)
Chloride: 98 mmol/L (ref 98–111)
Creatinine: 0.64 mg/dL (ref 0.44–1.00)
GFR, Estimated: >60 mL/min (ref >60)
Glucose, Bld: 181 mg/dL — ABNORMAL HIGH (ref 70–99)
Potassium: 3.4 mmol/L — ABNORMAL LOW (ref 3.5–5.1)
Sodium: 141 mmol/L (ref 135–145)
Total Bilirubin: 0.8 mg/dL (ref 0.3–1.2)
Total Protein: 7.1 g/dL (ref 6.5–8.1)

## 2020-08-08 LAB — CBC WITH DIFFERENTIAL (CANCER CENTER ONLY)
Abs Immature Granulocytes: 0.04 10*3/uL (ref 0.00–0.07)
Basophils Absolute: 0 10*3/uL (ref 0.0–0.1)
Basophils Relative: 1 %
Eosinophils Absolute: 0 10*3/uL (ref 0.0–0.5)
Eosinophils Relative: 1 %
HCT: 35.9 % — ABNORMAL LOW (ref 36.0–46.0)
Hemoglobin: 11.7 g/dL — ABNORMAL LOW (ref 12.0–15.0)
Immature Granulocytes: 1 %
Lymphocytes Relative: 19 %
Lymphs Abs: 1.2 10*3/uL (ref 0.7–4.0)
MCH: 27.5 pg (ref 26.0–34.0)
MCHC: 32.6 g/dL (ref 30.0–36.0)
MCV: 84.5 fL (ref 80.0–100.0)
Monocytes Absolute: 0.5 10*3/uL (ref 0.1–1.0)
Monocytes Relative: 8 %
Neutro Abs: 4.5 10*3/uL (ref 1.7–7.7)
Neutrophils Relative %: 70 %
Platelet Count: 67 10*3/uL — ABNORMAL LOW (ref 150–400)
RBC: 4.25 MIL/uL (ref 3.87–5.11)
RDW: 16.5 % — ABNORMAL HIGH (ref 11.5–15.5)
WBC Count: 6.4 10*3/uL (ref 4.0–10.5)
nRBC: 0 % (ref 0.0–0.2)

## 2020-08-08 LAB — PLATELET BY CITRATE

## 2020-08-08 LAB — SAVE SMEAR(SSMR), FOR PROVIDER SLIDE REVIEW

## 2020-08-08 NOTE — Progress Notes (Signed)
Hematology and Oncology Follow Up Visit  Rachael Buck [id number] June 19, 1941 79 y.o. 08/08/2020   Principle Diagnosis:  Thrombocytopenia -- NASH/Splenomegaly ; Immune based thrombocytopenia Iron deficiency  Current Therapy: Nplateas indicated  IV iron as indicated   Interim History:  Rachael Buck is here today for follow-up.  We might be run into problems now.  She is having more pain in the left upper quadrant.  She did have a CT scan done a couple weeks ago.  This did show significant increase in growth in the spleen.  The real problem now is whether or not this is a primary hematologic issue or if this is growth from cirrhosis.  She does not have any ascites.  This is very complicated.  I think were going to have to get a bone marrow biopsy on her to see if there is anything in the bone marrow that might suggest a primary hematologic issue, such as splenic lymphoma with villous lymphocytes.  I think hairy cell leukemia is also a possibility although incredibly rare.  I am surprised that the spleen has increased in size so much.  If this is all from cirrhosis, I am not sure exactly as to what we could do.  She has had no fever.  She still has high blood sugars.  I am not sure how well they are being managed.  She has had no rashes.  She has had no leg swelling.  She has had no obvious change in bowel or bladder habits.  She has had no melena or hematochezia.  There is been no cough or shortness of breath.  She has had no chest wall pain.  She is not noted any swollen lymph nodes.  Overall, her performance status is ECOG 1.    Medications:  Allergies as of 08/08/2020      Reactions   Codeine Nausea And Vomiting   Metformin And Related Diarrhea   Shrimp [shellfish Allergy] Swelling      Medication List       Accurate as of August 08, 2020  2:36 PM. If you have any questions, ask your nurse or doctor.        albuterol 108 (90 Base) MCG/ACT  inhaler Commonly known as: ProAir HFA Inhale 2 puffs into the lungs every 4 (four) hours as needed for wheezing or shortness of breath.   ALPRAZolam 0.5 MG tablet Commonly known as: XANAX TAKE 1 TABLET(0.5 MG) BY MOUTH THREE TIMES DAILY   colestipol 1 g tablet Commonly known as: COLESTID Take 1 g by mouth daily.   dicyclomine 20 MG tablet Commonly known as: BENTYL Take 1 tablet (20 mg total) by mouth 3 (three) times daily as needed.   diphenhydrAMINE 25 MG tablet Commonly known as: BENADRYL Take 25 mg by mouth every 6 (six) hours as needed (for allergies.).   empagliflozin 10 MG Tabs tablet Commonly known as: JARDIANCE Take 10 mg by mouth daily.   glipiZIDE 5 MG 24 hr tablet Commonly known as: GLUCOTROL XL Take 1 tablet (5 mg total) by mouth daily with breakfast.   glucose blood test strip Commonly known as: ONE TOUCH ULTRA TEST TEST BLOOD SUGARS ONCE DAILY IN THE MORNING   hydrochlorothiazide 25 MG tablet Commonly known as: HYDRODIURIL TAKE 1 TABLET(25 MG) BY MOUTH DAILY   hydrocortisone 2.5 % cream Apply topically as needed.   ipratropium-albuterol 0.5-2.5 (3) MG/3ML Soln Commonly known as: DUONEB Take 3 mLs by nebulization every 6 (six) hours as needed.   loperamide  2 MG capsule Commonly known as: IMODIUM Take 4-6 mg by mouth daily as needed (for IBS symptoms).   Melatonin 2.5 MG Caps Take 2.5 mg by mouth at bedtime as needed. Take two capsules, total of 5 mg as needed at bedtime.   Mometasone Furoate 100 MCG/ACT Aero Commonly known as: Asmanex HFA Inhale 2 puffs into the lungs 2 (two) times daily.   omeprazole 40 MG capsule Commonly known as: PRILOSEC TAKE 1 CAPSULE(40 MG) BY MOUTH DAILY   OneTouch Delica Lancets Fine Misc 1 each by Other route daily.   potassium chloride SA 20 MEQ tablet Commonly known as: Klor-Con M20 Take 1 tablet (20 mEq total) by mouth daily.   sertraline 100 MG tablet Commonly known as: ZOLOFT Take 1 tablet (100 mg total)  by mouth daily.   simvastatin 80 MG tablet Commonly known as: ZOCOR TAKE 1 TABLET(80 MG) BY MOUTH DAILY       Allergies:  Allergies  Allergen Reactions  . Codeine Nausea And Vomiting  . Metformin And Related Diarrhea  . Shrimp [Shellfish Allergy] Swelling    Past Medical History, Surgical history, Social history, and Family History were reviewed and updated.  Review of Systems: Review of Systems  Constitutional: Positive for malaise/fatigue.  HENT: Negative.   Eyes: Negative.   Respiratory: Negative.   Cardiovascular: Negative.   Gastrointestinal: Positive for abdominal pain.  Genitourinary: Negative.   Musculoskeletal: Negative.   Skin: Negative.   Neurological: Negative.   Endo/Heme/Allergies: Negative.   Psychiatric/Behavioral: Negative.      Physical Exam:  weight is 193 lb 12.8 oz (87.9 kg). Her oral temperature is 97.9 F (36.6 C). Her blood pressure is 125/109 (abnormal) and her pulse is 86. Her respiration is 20 and oxygen saturation is 97%.   Wt Readings from Last 3 Encounters:  08/08/20 193 lb 12.8 oz (87.9 kg)  07/02/20 192 lb 0.6 oz (87.1 kg)  04/28/20 191 lb (86.6 kg)    Physical Exam Vitals reviewed.  HENT:     Head: Normocephalic and atraumatic.  Eyes:     Pupils: Pupils are equal, round, and reactive to light.  Cardiovascular:     Rate and Rhythm: Normal rate and regular rhythm.     Heart sounds: Normal heart sounds.  Pulmonary:     Effort: Pulmonary effort is normal.     Breath sounds: Normal breath sounds.  Abdominal:     General: Bowel sounds are normal.     Palpations: Abdomen is soft.  Musculoskeletal:        General: No tenderness or deformity. Normal range of motion.     Cervical back: Normal range of motion.  Lymphadenopathy:     Cervical: No cervical adenopathy.  Skin:    General: Skin is warm and dry.     Findings: No erythema or rash.  Neurological:     Mental Status: She is alert and oriented to person, place, and time.   Psychiatric:        Behavior: Behavior normal.        Thought Content: Thought content normal.        Judgment: Judgment normal.      Lab Results  Component Value Date   WBC 6.4 08/08/2020   HGB 11.7 (L) 08/08/2020   HCT 35.9 (L) 08/08/2020   MCV 84.5 08/08/2020   PLT 67 (L) 08/08/2020   Lab Results  Component Value Date   FERRITIN 17 07/02/2020   IRON 92 07/02/2020   TIBC 461 (H)  07/02/2020   UIBC 369 07/02/2020   IRONPCTSAT 20 (L) 07/02/2020   Lab Results  Component Value Date   RETICCTPCT 2.2 07/02/2020   RBC 4.25 08/08/2020   No results found for: KPAFRELGTCHN, LAMBDASER, KAPLAMBRATIO No results found for: IGGSERUM, IGA, IGMSERUM No results found for: Odetta Pink, SPEI   Chemistry      Component Value Date/Time   NA 141 08/08/2020 1342   NA 127 (L) 02/14/2017 1335   K 3.4 (L) 08/08/2020 1342   K 3.6 02/14/2017 1335   CL 98 08/08/2020 1342   CL 92 (L) 02/14/2017 1335   CO2 34 (H) 08/08/2020 1342   CO2 28 02/14/2017 1335   BUN 15 08/08/2020 1342   BUN 7 (L) 02/14/2017 1335   CREATININE 0.64 08/08/2020 1342   CREATININE 0.47 (L) 02/14/2017 1335   CREATININE 0.56 07/24/2014 1204      Component Value Date/Time   CALCIUM 9.6 08/08/2020 1342   CALCIUM 9.3 02/14/2017 1335   ALKPHOS 52 08/08/2020 1342   ALKPHOS 68 02/14/2017 1335   AST 20 08/08/2020 1342   ALT 12 08/08/2020 1342   BILITOT 0.8 08/08/2020 1342      Impression and Plan: Rachael Buck is a very pleasant 79 yo caucasian female with 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   Her platelet count is holding steady.  Again, I am not sure if the splenomegaly growth is from cirrhosis or if this is from a primary hematologic issue.  I talked to her and her daughter today.  I explained why we should consider a bone marrow biopsy with her.  I do think we could get some good information from this that could potentially affect her  management.  We will try to get the bone marrow biopsy in a couple weeks.  I will then plan to get her back to see [place] when we have the results back and then we can figure out what else needs to be done.  Again, this is quite complicated.  There are several factors in play that we have to consider.    [name], MD 12/10/20212:36 PM

## 2020-08-11 ENCOUNTER — Telehealth: Payer: Self-pay | Admitting: Hematology & Oncology

## 2020-08-11 LAB — IRON AND TIBC
Iron: 70 ug/dL (ref 41–142)
Saturation Ratios: 16 % — ABNORMAL LOW (ref 21–57)
TIBC: 450 ug/dL — ABNORMAL HIGH (ref 236–444)
UIBC: 380 ug/dL (ref 120–384)

## 2020-08-11 LAB — FERRITIN: Ferritin: 15 ng/mL (ref 11–307)

## 2020-08-11 NOTE — Telephone Encounter (Signed)
No los 12/10

## 2020-08-31 ENCOUNTER — Other Ambulatory Visit: Payer: Self-pay | Admitting: Radiology

## 2020-09-02 ENCOUNTER — Encounter (HOSPITAL_COMMUNITY): Payer: Self-pay

## 2020-09-02 ENCOUNTER — Ambulatory Visit (HOSPITAL_COMMUNITY)
Admission: RE | Admit: 2020-09-02 | Discharge: 2020-09-02 | Disposition: A | Discharge status: Home / Self Care | Payer: PPO | Source: Ambulatory Visit | Attending: Hematology & Oncology | Admitting: Hematology & Oncology

## 2020-09-02 ENCOUNTER — Other Ambulatory Visit: Payer: Self-pay

## 2020-09-02 DIAGNOSIS — K5790 Diverticulosis of intestine, part unspecified, without perforation or abscess without bleeding: Secondary | ICD-10-CM | POA: Insufficient documentation

## 2020-09-02 DIAGNOSIS — K219 Gastro-esophageal reflux disease without esophagitis: Secondary | ICD-10-CM | POA: Diagnosis not present

## 2020-09-02 DIAGNOSIS — Z7984 Long term (current) use of oral hypoglycemic drugs: Secondary | ICD-10-CM | POA: Diagnosis not present

## 2020-09-02 DIAGNOSIS — E119 Type 2 diabetes mellitus without complications: Secondary | ICD-10-CM | POA: Diagnosis not present

## 2020-09-02 DIAGNOSIS — D649 Anemia, unspecified: Secondary | ICD-10-CM | POA: Diagnosis not present

## 2020-09-02 DIAGNOSIS — Z90721 Acquired absence of ovaries, unilateral: Secondary | ICD-10-CM | POA: Insufficient documentation

## 2020-09-02 DIAGNOSIS — Z79899 Other long term (current) drug therapy: Secondary | ICD-10-CM | POA: Diagnosis not present

## 2020-09-02 DIAGNOSIS — Z87891 Personal history of nicotine dependence: Secondary | ICD-10-CM | POA: Diagnosis not present

## 2020-09-02 DIAGNOSIS — R161 Splenomegaly, not elsewhere classified: Secondary | ICD-10-CM | POA: Insufficient documentation

## 2020-09-02 DIAGNOSIS — K7581 Nonalcoholic steatohepatitis (NASH): Secondary | ICD-10-CM | POA: Diagnosis not present

## 2020-09-02 DIAGNOSIS — J45909 Unspecified asthma, uncomplicated: Secondary | ICD-10-CM | POA: Insufficient documentation

## 2020-09-02 DIAGNOSIS — Z96652 Presence of left artificial knee joint: Secondary | ICD-10-CM | POA: Insufficient documentation

## 2020-09-02 DIAGNOSIS — K7469 Other cirrhosis of liver: Secondary | ICD-10-CM | POA: Insufficient documentation

## 2020-09-02 DIAGNOSIS — D6489 Other specified anemias: Secondary | ICD-10-CM | POA: Diagnosis not present

## 2020-09-02 DIAGNOSIS — I10 Essential (primary) hypertension: Secondary | ICD-10-CM | POA: Insufficient documentation

## 2020-09-02 DIAGNOSIS — D696 Thrombocytopenia, unspecified: Secondary | ICD-10-CM | POA: Insufficient documentation

## 2020-09-02 DIAGNOSIS — Z9049 Acquired absence of other specified parts of digestive tract: Secondary | ICD-10-CM | POA: Diagnosis not present

## 2020-09-02 DIAGNOSIS — F419 Anxiety disorder, unspecified: Secondary | ICD-10-CM | POA: Diagnosis not present

## 2020-09-02 DIAGNOSIS — E785 Hyperlipidemia, unspecified: Secondary | ICD-10-CM | POA: Diagnosis not present

## 2020-09-02 DIAGNOSIS — Z90710 Acquired absence of both cervix and uterus: Secondary | ICD-10-CM | POA: Insufficient documentation

## 2020-09-02 LAB — CBC WITH DIFFERENTIAL/PLATELET
Abs Immature Granulocytes: 0.07 10*3/uL (ref 0.00–0.07)
Basophils Absolute: 0 10*3/uL (ref 0.0–0.1)
Basophils Relative: 1 %
Eosinophils Absolute: 0 10*3/uL (ref 0.0–0.5)
Eosinophils Relative: 0 %
HCT: 35.7 % — ABNORMAL LOW (ref 36.0–46.0)
Hemoglobin: 11.6 g/dL — ABNORMAL LOW (ref 12.0–15.0)
Immature Granulocytes: 2 %
Lymphocytes Relative: 20 %
Lymphs Abs: 0.9 10*3/uL (ref 0.7–4.0)
MCH: 27.7 pg (ref 26.0–34.0)
MCHC: 32.5 g/dL (ref 30.0–36.0)
MCV: 85.2 fL (ref 80.0–100.0)
Monocytes Absolute: 0.4 10*3/uL (ref 0.1–1.0)
Monocytes Relative: 9 %
Neutro Abs: 3.1 10*3/uL (ref 1.7–7.7)
Neutrophils Relative %: 68 %
Platelets: 62 10*3/uL — ABNORMAL LOW (ref 150–400)
RBC: 4.19 MIL/uL (ref 3.87–5.11)
RDW: 16 % — ABNORMAL HIGH (ref 11.5–15.5)
WBC: 4.5 10*3/uL (ref 4.0–10.5)
nRBC: 0 % (ref 0.0–0.2)

## 2020-09-02 LAB — BASIC METABOLIC PANEL
Anion gap: 11 (ref 5–15)
BUN: 17 mg/dL (ref 8–23)
CO2: 30 mmol/L (ref 22–32)
Calcium: 8.7 mg/dL — ABNORMAL LOW (ref 8.9–10.3)
Chloride: 98 mmol/L (ref 98–111)
Creatinine, Ser: 0.53 mg/dL (ref 0.44–1.00)
GFR, Estimated: >60 mL/min (ref >60)
Glucose, Bld: 136 mg/dL — ABNORMAL HIGH (ref 70–99)
Potassium: 3.5 mmol/L (ref 3.5–5.1)
Sodium: 139 mmol/L (ref 135–145)

## 2020-09-02 LAB — GLUCOSE, CAPILLARY: Glucose-Capillary: 136 mg/dL — ABNORMAL HIGH (ref 70–99)

## 2020-09-02 LAB — PROTIME-INR
INR: 1.2 (ref 0.8–1.2)
Prothrombin Time: 14.7 seconds (ref 11.4–15.2)

## 2020-09-02 MED ORDER — FENTANYL CITRATE (PF) 100 MCG/2ML IJ SOLN
INTRAMUSCULAR | Status: AC
  Filled 2020-09-02: qty 2

## 2020-09-02 MED ORDER — MIDAZOLAM HCL 2 MG/2ML IJ SOLN
INTRAMUSCULAR | Status: AC
  Filled 2020-09-02: qty 2

## 2020-09-02 MED ORDER — SODIUM CHLORIDE 0.9 % IV SOLN: INTRAVENOUS | Status: DC

## 2020-09-02 MED ORDER — HYDROCODONE-ACETAMINOPHEN 5-325 MG PO TABS: 1.0000 | ORAL_TABLET | ORAL | Status: DC | PRN

## 2020-09-02 NOTE — Discharge Instructions (Signed)
Please call Interventional Radiology clinic [phone] with any questions or concerns.  You may remove your dressing and shower tomorrow.   Bone Marrow Aspiration and Bone Marrow Biopsy, Adult, Care After This sheet gives you information about how to care for yourself after your procedure. Your health care provider may also give you more specific instructions. If you have problems or questions, contact your health care provider. What can I expect after the procedure? After the procedure, it is common to have:  Mild pain and tenderness.  Swelling.  Bruising. Follow these instructions at home: Puncture site care   Follow instructions from your health care provider about how to take care of the puncture site. Make sure you: ? Wash your hands with soap and water before and after you change your bandage (dressing). If soap and water are not available, use hand sanitizer. ? Change your dressing as told by your health care provider.  Check your puncture site every day for signs of infection. Check for: ? More redness, swelling, or pain. ? Fluid or blood. ? Warmth. ? Pus or a bad smell. Activity  Return to your normal activities as told by your health care provider. Ask your health care provider what activities are safe for you.  Do not lift anything that is heavier than 10 lb (4.5 kg), or the limit that you are told, until your health care provider says that it is safe.  Do not drive for 24 hours if you were given a sedative during your procedure. General instructions   Take over-the-counter and prescription medicines only as told by your health care provider.  Do not take baths, swim, or use a hot tub until your health care provider approves. Ask your health care provider if you may take showers. You may only be allowed to take sponge baths.  If directed, put ice on the affected area. To do this: ? Put ice in a plastic bag. ? Place a towel between your skin and the  bag. ? Leave the ice on for 20 minutes, 2-3 times a day.  Keep all follow-up visits as told by your health care provider. This is important. Contact a health care provider if:  Your pain is not controlled with medicine.  You have a fever.  You have more redness, swelling, or pain around the puncture site.  You have fluid or blood coming from the puncture site.  Your puncture site feels warm to the touch.  You have pus or a bad smell coming from the puncture site. Summary  After the procedure, it is common to have mild pain, tenderness, swelling, and bruising.  Follow instructions from your health care provider about how to take care of the puncture site and what activities are safe for you.  Take over-the-counter and prescription medicines only as told by your health care provider.  Contact a health care provider if you have any signs of infection, such as fluid or blood coming from the puncture site. This information is not intended to replace advice given to you by your health care provider. Make sure you discuss any questions you have with your health care provider. Document Revised: 01/02/2019 Document Reviewed: 01/02/2019 Elsevier Patient Education  [place].    Moderate Conscious Sedation, Adult, Care After These instructions provide you with information about caring for yourself after your procedure. Your health care provider may also give you more specific instructions. Your treatment has been planned according to current medical practices, but problems sometimes occur.  Call your health care provider if you have any problems or questions after your procedure. What can I expect after the procedure? After your procedure, it is common:  To feel sleepy for several hours.  To feel clumsy and have poor balance for several hours.  To have poor judgment for several hours.  To vomit if you eat too soon. Follow these instructions at home: For at least 24 hours  after the procedure:   Do not: ? Participate in activities where you could fall or become injured. ? Drive. ? Use heavy machinery. ? Drink alcohol. ? Take sleeping pills or medicines that cause drowsiness. ? Make important decisions or sign legal documents. ? Take care of children on your own.  Rest. Eating and drinking  Follow the diet recommended by your health care provider.  If you vomit: ? Drink water, juice, or soup when you can drink without vomiting. ? Make sure you have little or no nausea before eating solid foods. General instructions  Have a responsible adult stay with you until you are awake and alert.  Take over-the-counter and prescription medicines only as told by your health care provider.  If you smoke, do not smoke without supervision.  Keep all follow-up visits as told by your health care provider. This is important. Contact a health care provider if:  You keep feeling nauseous or you keep vomiting.  You feel light-headed.  You develop a rash.  You have a fever. Get help right away if:  You have trouble breathing. This information is not intended to replace advice given to you by your health care provider. Make sure you discuss any questions you have with your health care provider. Document Revised: 07/29/2017 Document Reviewed: 12/06/2015 Elsevier Patient Education  2020 Elsevier Inc.  

## 2020-09-02 NOTE — Procedures (Signed)
Interventional Radiology Procedure Note  Procedure: CT guided aspirate and core biopsy of right iliac bone  Complications: None  Recommendations: - Bedrest supine x 1 hrs - Hydrocodone PRN  Pain - Follow biopsy results   Rachael Bourke, MD   

## 2020-09-02 NOTE — Consult Note (Signed)
Chief Complaint: Patient was seen in consultation today for CT-guided bone marrow biopsy  Referring Physician(s): Ennever,Peter R  Supervising Physician: Ruthann Cancer  Patient Status: [facility] - Out-pt  History of Present Illness: Rachael Buck is a 80 y.o. female with past medical history of anxiety, asthma, NASH cirrhosis, diabetes, diverticulosis, GERD, hyperlipidemia, hypertension, and now with thrombocytopenia and progressive splenomegaly.  She presents today for CT-guided bone marrow biopsy for further evaluation.  Past Medical History:  Diagnosis Date  . Anxiety   . Arthritis   . Asthma   . Cirrhosis, nonalcoholic ([place])   . Colon polyps   . Diabetes mellitus   . Diverticulitis   . GERD (gastroesophageal reflux disease)   . Hyperlipidemia   . Hypertension   . NASH (nonalcoholic steatohepatitis)   . Obesity   . Pneumonia     Past Surgical History:  Procedure Laterality Date  . CHOLECYSTECTOMY  1981  . ENDOVENOUS ABLATION SAPHENOUS VEIN W/ LASER Left 02/21/2018   endovenous laser ablation left greater saphenous vein by Tinnie Gens MD   . [place]  . PARTIAL HYSTERECTOMY  1972  . ROTATOR CUFF REPAIR     bilateral. 2006, 2008  . 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 TOTAL KNEE ARTHROPLASTY Left 02/18/2017   Procedure: LEFT TOTAL KNEE ARTHROPLASTY;  Surgeon: Netta Cedars, MD;  Location: [place];  Service: Orthopedics;  Laterality: Left;  Marland Kitchen VEIN SURGERY      Allergies: Codeine, Metformin and related, and Shrimp [shellfish allergy]  Medications: Prior to Admission medications   Medication Sig Start Date End Date Taking? Authorizing Provider  albuterol (PROAIR HFA) 108 (90 Base) MCG/ACT inhaler Inhale 2 puffs into the lungs every 4 (four) hours as needed for wheezing or shortness of breath. 09/02/16   Midge Minium, MD  ALPRAZolam Duanne Moron) 0.5 MG tablet TAKE 1 TABLET(0.5 MG) BY MOUTH THREE TIMES DAILY 06/09/20   Copland, Gay Filler, MD   colestipol (COLESTID) 1 g tablet Take 1 g by mouth daily.    [provider]  dicyclomine (BENTYL) 20 MG tablet Take 1 tablet (20 mg total) by mouth 3 (three) times daily as needed. 03/04/20   Copland, Gay Filler, MD  diphenhydrAMINE (BENADRYL) 25 MG tablet Take 25 mg by mouth every 6 (six) hours as needed (for allergies.).    [provider]  empagliflozin (JARDIANCE) 10 MG TABS tablet Take 10 mg by mouth daily. 11/26/19   Copland, Gay Filler, MD  glipiZIDE (GLUCOTROL XL) 5 MG 24 hr tablet Take 1 tablet (5 mg total) by mouth daily with breakfast. 12/28/19   Copland, Gay Filler, MD  glucose blood (ONE TOUCH ULTRA TEST) test strip TEST BLOOD SUGARS ONCE DAILY IN THE MORNING 08/08/19   Copland, Gay Filler, MD  hydrochlorothiazide (HYDRODIURIL) 25 MG tablet TAKE 1 TABLET(25 MG) BY MOUTH DAILY 04/28/20   Copland, Gay Filler, MD  hydrocortisone 2.5 % cream Apply topically as needed.    [provider]  ipratropium-albuterol (DUONEB) 0.5-2.5 (3) MG/3ML SOLN Take 3 mLs by nebulization every 6 (six) hours as needed. 01/16/18   Copland, Gay Filler, MD  loperamide (IMODIUM) 2 MG capsule Take 4-6 mg by mouth daily as needed (for IBS symptoms).    [provider]  Melatonin 2.5 MG CAPS Take 2.5 mg by mouth at bedtime as needed. Take two capsules, total of 5 mg as needed at bedtime.    [provider]  Mometasone Furoate [facility] HFA) 100 MCG/ACT AERO Inhale 2 puffs  into the lungs 2 (two) times daily. Patient not taking: Reported on 08/08/2020 02/13/18   Copland, Gay Filler, MD  omeprazole (PRILOSEC) 40 MG capsule TAKE 1 CAPSULE(40 MG) BY MOUTH DAILY 04/28/20   Copland, Gay Filler, MD  [facility] DELICA LANCETS FINE MISC 1 each by Other route daily. 05/18/18   Copland, Gay Filler, MD  potassium chloride SA (KLOR-CON M20) 20 MEQ tablet Take 1 tablet (20 mEq total) by mouth daily. 01/31/20   Copland, Gay Filler, MD  sertraline (ZOLOFT) 100 MG tablet Take 1 tablet (100 mg total) by mouth  daily. 07/15/20   Copland, Gay Filler, MD  simvastatin (ZOCOR) 80 MG tablet TAKE 1 TABLET(80 MG) BY MOUTH DAILY 07/29/20   Copland, Gay Filler, MD     Family History  Problem Relation Age of Onset  . Breast cancer Daughter   . Breast cancer Maternal Aunt   . Diabetes Brother   . Diabetes Sister   . Stomach cancer Mother   . Cancer Other   . GER disease Other   . Obesity Other     Social History   Socioeconomic History  . Marital status: Married    Spouse name: Not on file  . Number of children: Not on file  . Years of education: Not on file  . Highest education level: Not on file  Occupational History  . Not on file  Tobacco Use  . Smoking status: Former Research scientist (life sciences)  . Smokeless tobacco: Never Used  Vaping Use  . Vaping Use: Never used  Substance and Sexual Activity  . Alcohol use: No  . Drug use: No  . Sexual activity: Not on file  Other Topics Concern  . Not on file  Social History Narrative  . Not on file   Social Determinants of Health   Financial Resource Strain: Not on file  Food Insecurity: Not on file  Transportation Needs: Not on file  Physical Activity: Not on file  Stress: Not on file  Social Connections: Not on file      Review of Systems denies fever, chest pain, nausea, vomiting or bleeding.  Does have occasional headaches, some dyspnea with exertion, occasional cough, abdominal/back discomfort.  Vital Signs:pending   Physical Exam awake, alert.  Chest clear to auscultation bilaterally.  Heart with regular rate and rhythm.  Abdomen soft, positive bowel sounds, tender upper abdominal regions to palpation.  No significant lower extremity edema.  Imaging: No results found.  Labs:  CBC: Recent Labs    03/07/20 0921 04/28/20 1019 07/02/20 1143 08/08/20 1342  WBC 5.1 4.2 6.5 6.4  HGB 9.9* 12.1 12.6 11.7*  HCT 32.2* 36.7 38.4 35.9*  PLT 58* 60* 70* 67*    COAGS: No results for input(s): INR, APTT in the last 8760 hours.  BMP: Recent  Labs    11/29/19 0912 12/07/19 1125 01/24/20 1508 03/07/20 0921 04/28/20 1019 07/02/20 1143 08/08/20 1342  NA 139 137   < > 139 140 138 141  K 3.1* 2.8*   < > 3.4* 3.5 3.4* 3.4*  CL 97* 95*   < > 97* 100 96* 98  CO2 34* 31   < > 33* 31 35* 34*  GLUCOSE 188* 178*   < > 273* 229* 178* 181*  BUN 13 9   < > _0 CALCIUM 9.3 9.2   < > 9.6 9.7 10.1 9.6  CREATININE 0.63 0.56   < > 0.64 0.62 0.62 0.64  GFRNONAA >60 >60  --  >  60 >60 >60 >60  GFRAA >60 >60  --  >60 >60  --   --    < > = values in this interval not displayed.    LIVER FUNCTION TESTS: Recent Labs    03/07/20 0921 04/28/20 1019 07/02/20 1143 08/08/20 1342  BILITOT 0.9 0.9 1.0 0.8  AST _0 ALT _1 ALKPHOS 50 51 53 52  PROT 7.2 7.2 7.6 7.1  ALBUMIN 4.4 4.4 4.5 4.3    TUMOR MARKERS: No results for input(s): AFPTM, CEA, CA199, CHROMGRNA in the last 8760 hours.  Assessment and Plan: 80 y.o. female with past medical history of anxiety, asthma, NASH cirrhosis, diabetes, diverticulosis, GERD, hyperlipidemia, hypertension, and now with thrombocytopenia and progressive splenomegaly.  She presents today for CT-guided bone marrow biopsy for further evaluation.Risks and benefits of procedure was discussed with the patient including, but not limited to bleeding, infection, damage to adjacent structures or low yield requiring additional tests.  All of the questions were answered and there is agreement to proceed.  Consent signed and in chart.      Thank you for this interesting consult.  I greatly enjoyed meeting Rachael Buck and look forward to participating in their care.  A copy of this report was sent to the requesting provider on this date.  Electronically Signed: D. Rowe Robert, PA-C 09/02/2020, 10:02 AM   I spent a total of 20 minutes    in face to face in clinical consultation, greater than 50% of which was counseling/coordinating care for CT-guided bone marrow biopsy

## 2020-09-03 LAB — SURGICAL PATHOLOGY

## 2020-09-04 ENCOUNTER — Telehealth: Payer: Self-pay | Admitting: Hematology & Oncology

## 2020-09-04 NOTE — Telephone Encounter (Signed)
Called and spoke with patient about appointments added per 1/6 sch msg.  She requested to come in sooner.  I explained to her that we were awaiting her CT BX Pathology Results and advised her that if we received anything prior to her scheduled appointment Dr Marin Olp would advise me and move up appt if needed.  She voiced complete understanding of this info and was very appreciative

## 2020-09-09 ENCOUNTER — Encounter (HOSPITAL_COMMUNITY): Payer: Self-pay | Admitting: Hematology & Oncology

## 2020-09-10 ENCOUNTER — Other Ambulatory Visit: Payer: Self-pay | Admitting: Family Medicine

## 2020-09-23 ENCOUNTER — Inpatient Hospital Stay: Payer: PPO | Attending: Family

## 2020-09-23 ENCOUNTER — Other Ambulatory Visit: Payer: Self-pay | Admitting: *Deleted

## 2020-09-23 ENCOUNTER — Encounter: Payer: Self-pay | Admitting: Hematology & Oncology

## 2020-09-23 ENCOUNTER — Inpatient Hospital Stay (HOSPITAL_BASED_OUTPATIENT_CLINIC_OR_DEPARTMENT_OTHER): Payer: PPO | Admitting: Hematology & Oncology

## 2020-09-23 ENCOUNTER — Other Ambulatory Visit: Payer: Self-pay

## 2020-09-23 ENCOUNTER — Telehealth: Payer: Self-pay

## 2020-09-23 VITALS — BP 129/59 | HR 94 | Temp 98.8°F | Resp 18 | Wt 192.0 lb

## 2020-09-23 DIAGNOSIS — K7460 Unspecified cirrhosis of liver: Secondary | ICD-10-CM | POA: Diagnosis not present

## 2020-09-23 DIAGNOSIS — D696 Thrombocytopenia, unspecified: Secondary | ICD-10-CM | POA: Insufficient documentation

## 2020-09-23 DIAGNOSIS — R531 Weakness: Secondary | ICD-10-CM | POA: Diagnosis not present

## 2020-09-23 DIAGNOSIS — E08641 Diabetes mellitus due to underlying condition with hypoglycemia with coma: Secondary | ICD-10-CM | POA: Diagnosis not present

## 2020-09-23 DIAGNOSIS — Z79899 Other long term (current) drug therapy: Secondary | ICD-10-CM | POA: Insufficient documentation

## 2020-09-23 DIAGNOSIS — R161 Splenomegaly, not elsewhere classified: Secondary | ICD-10-CM | POA: Diagnosis not present

## 2020-09-23 DIAGNOSIS — K7581 Nonalcoholic steatohepatitis (NASH): Secondary | ICD-10-CM | POA: Insufficient documentation

## 2020-09-23 DIAGNOSIS — R5383 Other fatigue: Secondary | ICD-10-CM | POA: Diagnosis not present

## 2020-09-23 DIAGNOSIS — Z7984 Long term (current) use of oral hypoglycemic drugs: Secondary | ICD-10-CM | POA: Diagnosis not present

## 2020-09-23 DIAGNOSIS — D500 Iron deficiency anemia secondary to blood loss (chronic): Secondary | ICD-10-CM

## 2020-09-23 LAB — PLATELET BY CITRATE

## 2020-09-23 LAB — CBC WITH DIFFERENTIAL (CANCER CENTER ONLY)
Abs Immature Granulocytes: 0.03 10*3/uL (ref 0.00–0.07)
Basophils Absolute: 0 10*3/uL (ref 0.0–0.1)
Basophils Relative: 1 %
Eosinophils Absolute: 0 10*3/uL (ref 0.0–0.5)
Eosinophils Relative: 1 %
HCT: 35.3 % — ABNORMAL LOW (ref 36.0–46.0)
Hemoglobin: 11.5 g/dL — ABNORMAL LOW (ref 12.0–15.0)
Immature Granulocytes: 1 %
Lymphocytes Relative: 20 %
Lymphs Abs: 1.2 10*3/uL (ref 0.7–4.0)
MCH: 26.9 pg (ref 26.0–34.0)
MCHC: 32.6 g/dL (ref 30.0–36.0)
MCV: 82.7 fL (ref 80.0–100.0)
Monocytes Absolute: 0.6 10*3/uL (ref 0.1–1.0)
Monocytes Relative: 10 %
Neutro Abs: 4 10*3/uL (ref 1.7–7.7)
Neutrophils Relative %: 67 %
Platelet Count: 66 10*3/uL — ABNORMAL LOW (ref 150–400)
RBC: 4.27 MIL/uL (ref 3.87–5.11)
RDW: 16.2 % — ABNORMAL HIGH (ref 11.5–15.5)
WBC Count: 5.9 10*3/uL (ref 4.0–10.5)
nRBC: 0 % (ref 0.0–0.2)

## 2020-09-23 LAB — CMP (CANCER CENTER ONLY)
ALT: 14 U/L (ref 0–44)
AST: 23 U/L (ref 15–41)
Albumin: 4.3 g/dL (ref 3.5–5.0)
Alkaline Phosphatase: 50 U/L (ref 38–126)
Anion gap: 12 (ref 5–15)
BUN: 17 mg/dL (ref 8–23)
CO2: 32 mmol/L (ref 22–32)
Calcium: 9.4 mg/dL (ref 8.9–10.3)
Chloride: 96 mmol/L — ABNORMAL LOW (ref 98–111)
Creatinine: 0.58 mg/dL (ref 0.44–1.00)
GFR, Estimated: >60 mL/min (ref >60)
Glucose, Bld: 197 mg/dL — ABNORMAL HIGH (ref 70–99)
Potassium: 3.5 mmol/L (ref 3.5–5.1)
Sodium: 140 mmol/L (ref 135–145)
Total Bilirubin: 1.1 mg/dL (ref 0.3–1.2)
Total Protein: 7.6 g/dL (ref 6.5–8.1)

## 2020-09-23 LAB — SAVE SMEAR(SSMR), FOR PROVIDER SLIDE REVIEW

## 2020-09-23 NOTE — Progress Notes (Signed)
Hematology and Oncology Follow Up Visit  Rachael Buck [id number] Jul 14, 1941 80 y.o. 09/23/2020   Principle Diagnosis:  Thrombocytopenia -- NASH/Splenomegaly ; Immune based thrombocytopenia Iron deficiency  Current Therapy: Nplateas indicated  IV iron as indicated   Interim History:  Rachael Buck is here today for follow-up.  We did go ahead and do a bone marrow biopsy on her.  This was done on 09/02/2020.  The pathology report ([id number]) showed a hypercellular marrow with pan myeloid proliferation.  There is no dyspoietic changes.  There were no blasts.  She had increased number of megakaryocytes.  There is some with abnormal morphology.  I had to believe that this is all related to her underlying cirrhosis and subsequent splenomegaly.  We did do cytogenetics and FISH studies.  These were both negative.  I cannot imagine that she has an underlying myeloproliferative disorder.  We may have to send off a JAK2 assay on her just to make sure that there is no underlying issue with this.    I still think that her major problem is going to be the cirrhosis and subsequent diabetes.  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  She did have a nice Christmas.  She is trying to watch what she eats.  There has been no problems with nausea or vomiting.  She has had no bleeding.  There has been no cough or shortness of breath.  Overall, her performance status is ECOG 1.      Medications:  Allergies as of 09/23/2020      Reactions   Codeine Nausea And Vomiting   Metformin And Related Diarrhea   Shrimp [shellfish Allergy] Swelling      Medication List       Accurate as of September 23, 2020 10:47 AM. If you have any questions, ask your nurse or doctor.        albuterol 108 (90 Base) MCG/ACT inhaler Commonly known as: ProAir HFA Inhale 2 puffs into the lungs every 4 (four) hours as needed for wheezing or shortness of breath.    ALPRAZolam 0.5 MG tablet Commonly known as: XANAX TAKE 1 TABLET(0.5 MG) BY MOUTH THREE TIMES DAILY   colestipol 1 g tablet Commonly known as: COLESTID Take 1 g by mouth daily.   dicyclomine 20 MG tablet Commonly known as: BENTYL Take 1 tablet (20 mg total) by mouth 3 (three) times daily as needed.   diphenhydrAMINE 25 MG tablet Commonly known as: BENADRYL Take 25 mg by mouth every 6 (six) hours as needed (for allergies.).   empagliflozin 10 MG Tabs tablet Commonly known as: JARDIANCE Take 10 mg by mouth daily.   glipiZIDE 5 MG 24 hr tablet Commonly known as: GLUCOTROL XL Take 1 tablet (5 mg total) by mouth daily with breakfast.   glucose blood test strip Commonly known as: ONE TOUCH ULTRA TEST TEST BLOOD SUGARS ONCE DAILY IN THE MORNING   hydrochlorothiazide 25 MG tablet Commonly known as: HYDRODIURIL TAKE 1 TABLET(25 MG) BY MOUTH DAILY   hydrocortisone 2.5 % cream Apply topically as needed.   ipratropium-albuterol 0.5-2.5 (3) MG/3ML Soln Commonly known as: DUONEB Take 3 mLs by nebulization every 6 (six) hours as needed.   loperamide 2 MG capsule Commonly known as: IMODIUM Take 4-6 mg by mouth daily as needed (for IBS symptoms).   Melatonin 2.5 MG Caps Take 2.5 mg by mouth at bedtime as needed. Take two capsules, total of 5 mg as needed at bedtime.  Mometasone Furoate 100 MCG/ACT Aero Commonly known as: Asmanex HFA Inhale 2 puffs into the lungs 2 (two) times daily.   omeprazole 40 MG capsule Commonly known as: PRILOSEC TAKE 1 CAPSULE(40 MG) BY MOUTH DAILY   OneTouch Delica Lancets Fine Misc 1 each by Other route daily.   potassium chloride SA 20 MEQ tablet Commonly known as: [place]-Con M20 Take 1 tablet (20 mEq total) by mouth daily.   sertraline 100 MG tablet Commonly known as: ZOLOFT Take 1 tablet (100 mg total) by mouth daily.   simvastatin 80 MG tablet Commonly known as: ZOCOR TAKE 1 TABLET(80 MG) BY MOUTH DAILY       Allergies:  Allergies   Allergen Reactions  . Codeine Nausea And Vomiting  . Metformin And Related Diarrhea  . Shrimp [Shellfish Allergy] Swelling    Past Medical History, Surgical history, Social history, and Family History were reviewed and updated.  Review of Systems: Review of Systems  Constitutional: Positive for malaise/fatigue.  HENT: Negative.   Eyes: Negative.   Respiratory: Negative.   Cardiovascular: Negative.   Gastrointestinal: Positive for abdominal pain.  Genitourinary: Negative.   Musculoskeletal: Negative.   Skin: Negative.   Neurological: Negative.   Endo/Heme/Allergies: Negative.   Psychiatric/Behavioral: Negative.      Physical Exam:  vitals were not taken for this visit.   Wt Readings from Last 3 Encounters:  09/02/20 191 lb (86.6 kg)  08/08/20 193 lb 12.8 oz (87.9 kg)  07/02/20 192 lb 0.6 oz (87.1 kg)    Physical Exam Vitals reviewed.  HENT:     Head: Normocephalic and atraumatic.  Eyes:     Pupils: Pupils are equal, round, and reactive to light.  Cardiovascular:     Rate and Rhythm: Normal rate and regular rhythm.     Heart sounds: Normal heart sounds.  Pulmonary:     Effort: Pulmonary effort is normal.     Breath sounds: Normal breath sounds.  Abdominal:     General: Bowel sounds are normal.     Palpations: Abdomen is soft.  Musculoskeletal:        General: No tenderness or deformity. Normal range of motion.     Cervical back: Normal range of motion.  Lymphadenopathy:     Cervical: No cervical adenopathy.  Skin:    General: Skin is warm and dry.     Findings: No erythema or rash.  Neurological:     Mental Status: She is alert and oriented to person, place, and time.  Psychiatric:        Behavior: Behavior normal.        Thought Content: Thought content normal.        Judgment: Judgment normal.      Lab Results  Component Value Date   WBC 5.9 09/23/2020   HGB 11.5 (L) 09/23/2020   HCT 35.3 (L) 09/23/2020   MCV 82.7 09/23/2020   PLT 66 (L)  09/23/2020   Lab Results  Component Value Date   FERRITIN 15 08/08/2020   IRON 70 08/08/2020   TIBC 450 (H) 08/08/2020   UIBC 380 08/08/2020   IRONPCTSAT 16 (L) 08/08/2020   Lab Results  Component Value Date   RETICCTPCT 2.2 07/02/2020   RBC 4.27 09/23/2020   No results found for: KPAFRELGTCHN, LAMBDASER, KAPLAMBRATIO No results found for: IGGSERUM, IGA, IGMSERUM No results found for: TOTALPROTELP, ALBUMINELP, A1GS, A2GS, BETS, BETA2SER, GAMS, MSPIKE, SPEI   Chemistry      Component Value Date/Time   NA 139 09/02/2020  1011   NA 127 (L) 02/14/2017 1335   K 3.5 09/02/2020 1011   K 3.6 02/14/2017 1335   CL 98 09/02/2020 1011   CL 92 (L) 02/14/2017 1335   CO2 30 09/02/2020 1011   CO2 28 02/14/2017 1335   BUN 17 09/02/2020 1011   BUN 7 (L) 02/14/2017 1335   CREATININE 0.53 09/02/2020 1011   CREATININE 0.64 08/08/2020 1342   CREATININE 0.47 (L) 02/14/2017 1335   CREATININE 0.56 07/24/2014 1204      Component Value Date/Time   CALCIUM 8.7 (L) 09/02/2020 1011   CALCIUM 9.3 02/14/2017 1335   ALKPHOS 52 08/08/2020 1342   ALKPHOS 68 02/14/2017 1335   AST 20 08/08/2020 1342   ALT 12 08/08/2020 1342   BILITOT 0.8 08/08/2020 1342      Impression and Plan: Ms. 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   I am glad that we did do the bone marrow test.  I am glad that did not show any obvious hematologic malignancy.  Again, I would be very shocked if she had a myeloproliferative problem.  For right now, her platelet count is holding steady.  I really do not think we have to do anything with this.  I would not imagine that she has underlying immune thrombocytopenia.  I would not think that we would have to do anything with the thrombocytopenia unless she is going to have any kind of surgical procedure done.  I would like to see her back in another 6 weeks.  Her sister came with her today.  This was a blessing.     [name], MD 1/25/202210:47 AM

## 2020-09-23 NOTE — Telephone Encounter (Signed)
appts made and printed for pt per 09/23/20 los   Rachael Buck

## 2020-09-24 LAB — FERRITIN: Ferritin: 14 ng/mL (ref 11–307)

## 2020-09-24 LAB — IRON AND TIBC
Iron: 71 ug/dL (ref 41–142)
Saturation Ratios: 15 % — ABNORMAL LOW (ref 21–57)
TIBC: 480 ug/dL — ABNORMAL HIGH (ref 236–444)
UIBC: 409 ug/dL — ABNORMAL HIGH (ref 120–384)

## 2020-09-30 ENCOUNTER — Other Ambulatory Visit: Payer: Self-pay

## 2020-09-30 ENCOUNTER — Inpatient Hospital Stay: Payer: PPO | Attending: Family

## 2020-09-30 VITALS — BP 120/52 | HR 76 | Temp 97.8°F | Resp 18

## 2020-09-30 DIAGNOSIS — D696 Thrombocytopenia, unspecified: Secondary | ICD-10-CM

## 2020-09-30 DIAGNOSIS — Z79899 Other long term (current) drug therapy: Secondary | ICD-10-CM | POA: Diagnosis not present

## 2020-09-30 MED ORDER — SODIUM CHLORIDE 0.9 % IV SOLN
INTRAVENOUS | Status: DC
  Filled 2020-09-30: qty 250

## 2020-09-30 MED ORDER — SODIUM CHLORIDE 0.9 % IV SOLN
200.0000 mg | Freq: Once | INTRAVENOUS | Status: AC
  Administered 2020-09-30: 200 mg via INTRAVENOUS
  Filled 2020-09-30: qty 200

## 2020-09-30 NOTE — Patient Instructions (Signed)

## 2020-10-09 ENCOUNTER — Other Ambulatory Visit: Payer: Self-pay | Admitting: Family Medicine

## 2020-10-13 ENCOUNTER — Telehealth: Payer: Self-pay

## 2020-10-13 DIAGNOSIS — M7989 Other specified soft tissue disorders: Secondary | ICD-10-CM

## 2020-10-13 NOTE — Telephone Encounter (Signed)
Pateint called stating she is having pain on left side radiating down to her hip and left leg. States she swelling has increased since she was last seen 2 weeks ago. Pain 7/10. Denies any other symptoms.   Discussed with Dr.ennever, would like patient to have [place] of left leg. Called and informed patient and order placed.

## 2020-10-14 ENCOUNTER — Encounter: Payer: Self-pay | Admitting: Family Medicine

## 2020-10-14 ENCOUNTER — Other Ambulatory Visit: Payer: Self-pay | Admitting: Family Medicine

## 2020-10-16 ENCOUNTER — Other Ambulatory Visit: Payer: Self-pay

## 2020-10-16 ENCOUNTER — Ambulatory Visit (HOSPITAL_BASED_OUTPATIENT_CLINIC_OR_DEPARTMENT_OTHER)
Admission: RE | Admit: 2020-10-16 | Discharge: 2020-10-16 | Disposition: A | Discharge status: Home / Self Care | Payer: PPO | Source: Ambulatory Visit | Attending: Hematology & Oncology | Admitting: Hematology & Oncology

## 2020-10-16 DIAGNOSIS — M7989 Other specified soft tissue disorders: Secondary | ICD-10-CM

## 2020-10-17 ENCOUNTER — Encounter: Payer: Self-pay | Admitting: *Deleted

## 2020-10-26 NOTE — Patient Instructions (Addendum)
It was great to see you again today! I will be in touch with your labs please do schedule your mammogram asap with the breast center After your labs, please stop by the imaging center on the ground floor and see if they can do your ultrasound now or schedule at a convenient time

## 2020-10-26 NOTE — Progress Notes (Addendum)
[place] at [facility] [address], [place], [place] [address] [phone]  Date:  10/29/2020   Name:  Rachael Buck   DOB:  04-Sep-1940   MRN:  [id number]  PCP:  [name], MD    Chief Complaint: Medication Refill and Abdominal Distention (Pain on left lower side, radiates from back)   History of Present Illness:  Rachael Buck is a 80 y.o. very pleasant female patient who presents with the following:  Patient today for follow-up, medication review- history of diabetes, hypertension, IBS, [name], cirrhosis, hyperlipidemia, thrombocytopenia  I also received the following my chart message from her daughter this week I know my mother has an appointment with you on Wednesday, 10/29/20. I wanted to ask you to do the dementia test to see if it reflects any memory loss. I am noticing a few things and just want to make sure. I know she has a lot on her plate being a caregiver to my Dad and my Aunt. She may just be overwhelmed an this may be what is causing some forgetfulness. Just want to check. Thank you so much, Scharlene Buck - her daughter.   Last seen by myself in May Most recent visit with Dr. Marin Olp in January.  She underwent bone marrow biopsy in January of this year-no evidence of cancer Principle Diagnosis:  Thrombocytopenia -- NASH/Splenomegaly ; Immune based thrombocytopenia Iron deficiency Current Therapy: Nplateas indicated  IV iron as indicated  Her gastroenterologist is Dr. Watt Climes  Hepatitis C screening-patient feels like this has been done, but I do not see evidence on chart.  Will obtain for today to be sure Tetanus vaccine Mammogram- will schedule at the breast center Colon cancer screening- per her GI doctor  COVID-19 booster- done  A1c is due Foot exam due-can complete today Eye exam due- reminded pt about this  Flu vaccine- give today   CMP, CBC up-to-date  I asked patient about  her memory.  She notes that she may forget things such as why she entered her room on occasion, but she is not really concerned about it.  During our interview she seems normal except she did confuse a few words, such as referring to a glucometer as a thermometer She is taking care of her husband who has significant dementia, she also cares for her 79 year old aunt who raised her from the time her mother died when [name] was 89 years old Performed MMSE today -pt missed 2 points, 28/30 score   Lab Results  Component Value Date   HGBA1C 7.6 (H) 12/07/2019   Simvastatin 80 20 mEq potassium daily Sertraline Asmanex Hydrochlorothiazide 25 Glipizide XL 5 Jardiance 10 Patient Active Problem List   Diagnosis Date Noted  . Cirrhosis of liver ([place]) 02/17/2018  . 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 Thrombocytopenia ([place]) 02/15/2017  . Varicose veins of left lower extremity with complications 77/41/2878  . Age-related cognitive decline 06/02/2015  . Fatigue 07/19/2014  . Orthostatic hypotension 05/14/2013  . Bronchitis with asthma, acute 01/29/2013  . Nail abnormality 09/04/2012  . Bleeding disorder ([place]) 05/08/2012  . Varices, esophageal ([place]) 03/21/2012  . NASH (nonalcoholic steatohepatitis) 03/21/2012  . HTN (hypertension) 12/21/2011  . Depression 03/28/2011  . IBS (irritable bowel syndrome) 03/23/2011  . INTRINSIC ASTHMA, UNSPECIFIED 08/27/2010  . DYSPNEA ON EXERTION 07/27/2010  . Uncontrolled diabetes mellitus ([place]) 07/24/2010  . MIXED HYPERLIPIDEMIA 07/24/2010  . OBESITY, UNSPECIFIED 07/24/2010  . ALLERGIC RHINITIS  CAUSE UNSPECIFIED 07/24/2010  . REFLUX ESOPHAGITIS 07/24/2010  . PAIN IN JOINT PELVIC REGION AND THIGH 07/24/2010    Past Medical History:  Diagnosis Date  . Anxiety   . Arthritis   . Asthma   . Cirrhosis, nonalcoholic ([place])   . Colon polyps   . Diabetes mellitus   . Diverticulitis   . GERD (gastroesophageal reflux disease)   . Hyperlipidemia   .  Hypertension   . NASH (nonalcoholic steatohepatitis)   . Obesity   . Pneumonia     Past Surgical History:  Procedure Laterality Date  . CHOLECYSTECTOMY  1981  . ENDOVENOUS ABLATION SAPHENOUS VEIN W/ LASER Left 02/21/2018   endovenous laser ablation left greater saphenous vein by Tinnie Gens MD   . [place]  . PARTIAL HYSTERECTOMY  1972  . ROTATOR CUFF REPAIR     bilateral. 2006, 2008  . 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 TOTAL KNEE ARTHROPLASTY Left 02/18/2017   Procedure: LEFT TOTAL KNEE ARTHROPLASTY;  Surgeon: Netta Cedars, MD;  Location: [facility];  Service: Orthopedics;  Laterality: Left;  Marland Kitchen VEIN SURGERY      Social History   Tobacco Use  . Smoking status: Former Research scientist (life sciences)  . Smokeless tobacco: Never Used  Vaping Use  . Vaping Use: Never used  Substance Use Topics  . Alcohol use: No  . Drug use: No    Family History  Problem Relation Age of Onset  . Breast cancer Daughter   . Breast cancer Maternal Aunt   . Diabetes Brother   . Diabetes Sister   . Stomach cancer Mother   . Cancer Other   . GER disease Other   . Obesity Other     Allergies  Allergen Reactions  . Codeine Nausea And Vomiting  . Metformin And Related Diarrhea  . Shrimp [Shellfish Allergy] Swelling    Medication list has been reviewed and updated.  Current Outpatient Medications on File Prior to Visit  Medication Sig Dispense Refill  . albuterol (PROAIR HFA) 108 (90 Base) MCG/ACT inhaler Inhale 2 puffs into the lungs every 4 (four) hours as needed for wheezing or shortness of breath. 1 Inhaler 6  . ALPRAZolam (XANAX) 0.5 MG tablet TAKE 1 TABLET(0.5 MG) BY MOUTH THREE TIMES DAILY. 90 tablet 0  . colestipol (COLESTID) 1 g tablet Take 1 g by mouth daily.    Marland Kitchen dicyclomine (BENTYL) 20 MG tablet Take 1 tablet (20 mg total) by mouth 3 (three) times daily as needed. 360 tablet 0  . diphenhydrAMINE (BENADRYL) 25 MG tablet Take 25 mg by mouth every 6 (six) hours as needed (for allergies.).     Marland Kitchen empagliflozin (JARDIANCE) 10 MG TABS tablet Take 10 mg by mouth daily. 90 tablet 3  . glipiZIDE (GLUCOTROL XL) 5 MG 24 hr tablet Take 1 tablet (5 mg total) by mouth daily with breakfast. 90 tablet 3  . hydrochlorothiazide (HYDRODIURIL) 25 MG tablet TAKE 1 TABLET(25 MG) BY MOUTH DAILY 90 tablet 2  . hydrocortisone 2.5 % cream Apply topically as needed.    . loperamide (IMODIUM) 2 MG capsule Take 4-6 mg by mouth daily as needed (for IBS symptoms).    Marland Kitchen omeprazole (PRILOSEC) 40 MG capsule TAKE 1 CAPSULE(40 MG) BY MOUTH DAILY 90 capsule 2  . ONETOUCH DELICA LANCETS FINE MISC 1 each by Other route daily. 100 each 12  . ONETOUCH ULTRA test strip TEST BLOOD SUGAR(S) ONCE DAILY IN THE MORNING 100 strip 12  . potassium chloride SA (KLOR-CON  M20) 20 MEQ tablet Take 1 tablet (20 mEq total) by mouth daily. 90 tablet 3  . sertraline (ZOLOFT) 100 MG tablet Take 1 tablet (100 mg total) by mouth daily. 90 tablet 3  . simvastatin (ZOCOR) 80 MG tablet TAKE 1 TABLET(80 MG) BY MOUTH DAILY 90 tablet 1  . UNABLE TO FIND Med Name: Provertic as needed     No current facility-administered medications on file prior to visit.    Review of Systems:  As per HPI- otherwise negative.   Physical Examination: Vitals:   10/29/20 1425  BP: 132/72  Pulse: 92  Resp: 20  Temp: 98.2 F (36.8 C)  SpO2: 95%   Vitals:   10/29/20 1425  Weight: 192 lb (87.1 kg)  Height: 5' 6"  (1.676 m)   Body mass index is 30.99 kg/m. Ideal Body Weight: Weight in (lb) to have BMI = 25: 154.6  GEN: no acute distress.  Overweight, otherwise looks well HEENT: Atraumatic, Normocephalic.  Ears and Nose: No external deformity. CV: RRR, No M/G/R. No JVD. No thrill. No extra heart sounds. PULM: CTA B, no wheezes, crackles, rhonchi. No retractions. No resp. distress. No accessory muscle use. ABD: S, NT, ND, +BS. No rebound.   She does have a large abdomen, not tender but organomegaly is present, possible ascites EXTR: No  c/c/e PSYCH: Normally interactive. Conversant.  Foot exam normal  Assessment and Plan: Controlled type 2 diabetes mellitus without complication, without long-term current use of insulin (HCC) - Plan: Hemoglobin A1c, blood glucose meter kit and supplies KIT  Essential hypertension - Plan: TSH  Cirrhosis of liver without ascites, unspecified hepatic cirrhosis type (HCC)  Mixed hyperlipidemia - Plan: Lipid panel  Thrombocytopenia ([place])  Encounter for hepatitis C screening test for low risk patient - Plan: Hepatitis C antibody  Abdominal distention - Plan: US Abdomen Limited  Needs flu shot - Plan: Flu Vaccine QUAD High Dose(Fluad)  Complaints of memory disturbance  Following up today in a few concerns. Check A1c, prescribe new meter for patient.  At last check her diabetes is under reasonable control for age Lab Results  Component Value Date   HGBA1C 7.6 (H) 12/07/2019   TSH pending Order abdominal ultrasound to evaluate for possible ascites, the patient is bothered by abdominal distention which can be uncomfortable. We will also rule out hepatitis C Give flu vaccine today Discussed her memory- I do think she may have some minor cognitive impairment c/w age but nothing of concern at this time.  I have asked her to let me know if this is changing or getting worse   This visit occurred during the SARS-CoV-2 public health emergency.  Safety protocols were in place, including screening questions prior to the visit, additional usage of staff PPE, and extensive cleaning of exam room while observing appropriate contact time as indicated for disinfecting solutions.    Signed Lamar Blinks, MD  Received her labs as below- message to pt  Results for orders placed or performed in visit on 10/29/20  Hemoglobin A1c  Result Value Ref Range   Hgb A1c MFr Bld 6.7 (H) 4.6 - 6.5 %  Hepatitis C antibody  Result Value Ref Range   Hepatitis C Ab NON-REACTIVE NON-REACTI   SIGNAL TO CUT-OFF  0.02 <1.00  Lipid panel  Result Value Ref Range   Cholesterol 123 0 - 200 mg/dL   Triglycerides 268.0 (H) 0.0 - 149.0 mg/dL   HDL 43.70 >39.00 mg/dL   VLDL 53.6 (H) 0.0 - 40.0 mg/dL  Total CHOL/HDL Ratio 3    NonHDL 79.60   TSH  Result Value Ref Range   TSH 1.56 0.35 - 4.50 uIU/mL  LDL cholesterol, direct  Result Value Ref Range   Direct LDL 46.0 mg/dL

## 2020-10-27 ENCOUNTER — Encounter: Payer: Self-pay | Admitting: Family Medicine

## 2020-10-29 ENCOUNTER — Other Ambulatory Visit: Payer: Self-pay

## 2020-10-29 ENCOUNTER — Encounter: Payer: Self-pay | Admitting: Family Medicine

## 2020-10-29 ENCOUNTER — Ambulatory Visit (INDEPENDENT_AMBULATORY_CARE_PROVIDER_SITE_OTHER): Payer: PPO | Admitting: Family Medicine

## 2020-10-29 ENCOUNTER — Ambulatory Visit (HOSPITAL_BASED_OUTPATIENT_CLINIC_OR_DEPARTMENT_OTHER)
Admission: RE | Admit: 2020-10-29 | Discharge: 2020-10-29 | Disposition: A | Discharge status: Home / Self Care | Payer: PPO | Source: Ambulatory Visit | Attending: Family Medicine | Admitting: Family Medicine

## 2020-10-29 VITALS — BP 132/72 | HR 92 | Temp 98.2°F | Resp 20 | Ht 66.0 in | Wt 192.0 lb

## 2020-10-29 DIAGNOSIS — R140 Abdominal distension (gaseous): Secondary | ICD-10-CM

## 2020-10-29 DIAGNOSIS — K7460 Unspecified cirrhosis of liver: Secondary | ICD-10-CM | POA: Diagnosis not present

## 2020-10-29 DIAGNOSIS — E119 Type 2 diabetes mellitus without complications: Secondary | ICD-10-CM | POA: Diagnosis not present

## 2020-10-29 DIAGNOSIS — D696 Thrombocytopenia, unspecified: Secondary | ICD-10-CM

## 2020-10-29 DIAGNOSIS — I10 Essential (primary) hypertension: Secondary | ICD-10-CM | POA: Diagnosis not present

## 2020-10-29 DIAGNOSIS — E782 Mixed hyperlipidemia: Secondary | ICD-10-CM

## 2020-10-29 DIAGNOSIS — R413 Other amnesia: Secondary | ICD-10-CM | POA: Diagnosis not present

## 2020-10-29 DIAGNOSIS — Z1159 Encounter for screening for other viral diseases: Secondary | ICD-10-CM | POA: Diagnosis not present

## 2020-10-29 DIAGNOSIS — Z23 Encounter for immunization: Secondary | ICD-10-CM | POA: Diagnosis not present

## 2020-10-29 DIAGNOSIS — R188 Other ascites: Secondary | ICD-10-CM | POA: Diagnosis not present

## 2020-10-29 MED ORDER — BLOOD GLUCOSE MONITOR KIT: PACK | 0 refills | Status: AC

## 2020-10-30 ENCOUNTER — Encounter: Payer: Self-pay | Admitting: Family Medicine

## 2020-10-30 LAB — LIPID PANEL
Cholesterol: 123 mg/dL (ref 0–200)
HDL: 43.7 mg/dL (ref >39.00)
NonHDL: 79.6
Total CHOL/HDL Ratio: 3
Triglycerides: 268 mg/dL — ABNORMAL HIGH (ref 0.0–149.0)
VLDL: 53.6 mg/dL — ABNORMAL HIGH (ref 0.0–40.0)

## 2020-10-30 LAB — HEMOGLOBIN A1C: Hgb A1c MFr Bld: 6.7 % — ABNORMAL HIGH (ref 4.6–6.5)

## 2020-10-30 LAB — TSH: TSH: 1.56 u[IU]/mL (ref 0.35–4.50)

## 2020-10-30 LAB — HEPATITIS C ANTIBODY
Hepatitis C Ab: NONREACTIVE
SIGNAL TO CUT-OFF: 0.02 (ref <1.00)

## 2020-10-30 LAB — LDL CHOLESTEROL, DIRECT: Direct LDL: 46 mg/dL

## 2020-11-13 ENCOUNTER — Other Ambulatory Visit: Payer: Self-pay | Admitting: Family Medicine

## 2020-11-20 ENCOUNTER — Telehealth: Payer: Self-pay

## 2020-11-20 ENCOUNTER — Other Ambulatory Visit: Payer: Self-pay

## 2020-11-20 ENCOUNTER — Encounter: Payer: Self-pay | Admitting: Hematology & Oncology

## 2020-11-20 ENCOUNTER — Inpatient Hospital Stay: Payer: PPO | Attending: Family

## 2020-11-20 ENCOUNTER — Inpatient Hospital Stay (HOSPITAL_BASED_OUTPATIENT_CLINIC_OR_DEPARTMENT_OTHER): Payer: PPO | Admitting: Hematology & Oncology

## 2020-11-20 VITALS — BP 146/62 | HR 95 | Temp 99.6°F | Resp 19 | Wt 191.0 lb

## 2020-11-20 DIAGNOSIS — R140 Abdominal distension (gaseous): Secondary | ICD-10-CM | POA: Diagnosis not present

## 2020-11-20 DIAGNOSIS — E1165 Type 2 diabetes mellitus with hyperglycemia: Secondary | ICD-10-CM | POA: Diagnosis not present

## 2020-11-20 DIAGNOSIS — R609 Edema, unspecified: Secondary | ICD-10-CM | POA: Diagnosis not present

## 2020-11-20 DIAGNOSIS — E08641 Diabetes mellitus due to underlying condition with hypoglycemia with coma: Secondary | ICD-10-CM

## 2020-11-20 DIAGNOSIS — K7581 Nonalcoholic steatohepatitis (NASH): Secondary | ICD-10-CM | POA: Insufficient documentation

## 2020-11-20 DIAGNOSIS — D509 Iron deficiency anemia, unspecified: Secondary | ICD-10-CM | POA: Insufficient documentation

## 2020-11-20 DIAGNOSIS — Z79899 Other long term (current) drug therapy: Secondary | ICD-10-CM | POA: Diagnosis not present

## 2020-11-20 DIAGNOSIS — D696 Thrombocytopenia, unspecified: Secondary | ICD-10-CM

## 2020-11-20 LAB — CMP (CANCER CENTER ONLY)
ALT: 12 U/L (ref 0–44)
AST: 20 U/L (ref 15–41)
Albumin: 4.5 g/dL (ref 3.5–5.0)
Alkaline Phosphatase: 56 U/L (ref 38–126)
Anion gap: 9 (ref 5–15)
BUN: 19 mg/dL (ref 8–23)
CO2: 34 mmol/L — ABNORMAL HIGH (ref 22–32)
Calcium: 9.8 mg/dL (ref 8.9–10.3)
Chloride: 97 mmol/L — ABNORMAL LOW (ref 98–111)
Creatinine: 0.64 mg/dL (ref 0.44–1.00)
GFR, Estimated: >60 mL/min (ref >60)
Glucose, Bld: 222 mg/dL — ABNORMAL HIGH (ref 70–99)
Potassium: 3.4 mmol/L — ABNORMAL LOW (ref 3.5–5.1)
Sodium: 140 mmol/L (ref 135–145)
Total Bilirubin: 0.9 mg/dL (ref 0.3–1.2)
Total Protein: 7.7 g/dL (ref 6.5–8.1)

## 2020-11-20 LAB — CBC WITH DIFFERENTIAL (CANCER CENTER ONLY)
Abs Immature Granulocytes: 0.06 10*3/uL (ref 0.00–0.07)
Basophils Absolute: 0 10*3/uL (ref 0.0–0.1)
Basophils Relative: 1 %
Eosinophils Absolute: 0 10*3/uL (ref 0.0–0.5)
Eosinophils Relative: 1 %
HCT: 35.2 % — ABNORMAL LOW (ref 36.0–46.0)
Hemoglobin: 11.5 g/dL — ABNORMAL LOW (ref 12.0–15.0)
Immature Granulocytes: 1 %
Lymphocytes Relative: 20 %
Lymphs Abs: 1.1 10*3/uL (ref 0.7–4.0)
MCH: 26.7 pg (ref 26.0–34.0)
MCHC: 32.7 g/dL (ref 30.0–36.0)
MCV: 81.7 fL (ref 80.0–100.0)
Monocytes Absolute: 0.4 10*3/uL (ref 0.1–1.0)
Monocytes Relative: 8 %
Neutro Abs: 3.6 10*3/uL (ref 1.7–7.7)
Neutrophils Relative %: 69 %
Platelet Count: 70 10*3/uL — ABNORMAL LOW (ref 150–400)
RBC: 4.31 MIL/uL (ref 3.87–5.11)
RDW: 16.4 % — ABNORMAL HIGH (ref 11.5–15.5)
WBC Count: 5.2 10*3/uL (ref 4.0–10.5)
nRBC: 0 % (ref 0.0–0.2)

## 2020-11-20 LAB — SAVE SMEAR(SSMR), FOR PROVIDER SLIDE REVIEW

## 2020-11-20 LAB — LACTATE DEHYDROGENASE: LDH: 144 U/L (ref 98–192)

## 2020-11-20 MED ORDER — TRAMADOL HCL 50 MG PO TABS: 50.0000 mg | ORAL_TABLET | Freq: Four times a day (QID) | ORAL | 0 refills | Status: DC | PRN

## 2020-11-20 NOTE — Progress Notes (Signed)
Hematology and Oncology Follow Up Visit  Rachael Buck [id number] 1941/04/11 80 y.o. 11/20/2020   Principle Diagnosis:  Thrombocytopenia -- NASH/Splenomegaly ; Immune based thrombocytopenia Iron deficiency  Current Therapy: Nplateas indicated  IV iron as indicated   Interim History:  [name] is here today for follow-up.  She comes in with her sister.  She is doing okay.  She had abdominal ultrasound done 3 weeks ago.  Unfortunately it did not measure her spleen.  It was looking for ascites.  There really was no ascites.  She still is having some abdominal bloating.  I just do not think this is from the splenomegaly.  I really cannot palpate her spleen on exam.  I do think that she has some gas in the intestines.  I still think her big problem is the diabetes.  Her blood sugar today was 222.  At some point, I would think she is going to have to go on to insulin.  She has had no bleeding.  She is having some abdominal pain.  I will try her on some tramadol.  I think this would be reasonable to use.  She has had no fever.  She has had no obvious change in bowel or bladder habits.  She has had little bit of diarrhea.  There is no cough or shortness of breath.  She has had a little bit of leg swelling but this is been chronic.  Overall, I would say performance status is ECOG 1.     Medications:  Allergies as of 11/20/2020      Reactions   Codeine Nausea And Vomiting   Metformin And Related Diarrhea   Shrimp [shellfish Allergy] Swelling      Medication List       Accurate as of November 20, 2020 11:00 AM. If you have any questions, ask your nurse or doctor.        albuterol 108 (90 Base) MCG/ACT inhaler Commonly known as: ProAir HFA Inhale 2 puffs into the lungs every 4 (four) hours as needed for wheezing or shortness of breath.   ALPRAZolam 0.5 MG tablet Commonly known as: XANAX TAKE 1 TABLET(0.5 MG) BY MOUTH THREE TIMES DAILY   blood glucose meter kit  and supplies Kit Dispense based on patient and insurance preference. Use up to four times daily as directed.   colestipol 1 g tablet Commonly known as: COLESTID Take 1 g by mouth daily.   dicyclomine 20 MG tablet Commonly known as: BENTYL Take 1 tablet (20 mg total) by mouth 3 (three) times daily as needed.   diphenhydrAMINE 25 MG tablet Commonly known as: BENADRYL Take 25 mg by mouth every 6 (six) hours as needed (for allergies.).   empagliflozin 10 MG Tabs tablet Commonly known as: JARDIANCE Take 10 mg by mouth daily.   glipiZIDE 5 MG 24 hr tablet Commonly known as: GLUCOTROL XL Take 1 tablet (5 mg total) by mouth daily with breakfast.   hydrochlorothiazide 25 MG tablet Commonly known as: HYDRODIURIL TAKE 1 TABLET(25 MG) BY MOUTH DAILY   hydrocortisone 2.5 % cream Apply topically as needed.   loperamide 2 MG capsule Commonly known as: IMODIUM Take 4-6 mg by mouth daily as needed (for IBS symptoms).   omeprazole 40 MG capsule Commonly known as: PRILOSEC TAKE 1 CAPSULE(40 MG) BY MOUTH DAILY   OneTouch Delica Lancets Fine Misc 1 each by Other route daily.   OneTouch Ultra test strip Generic drug: glucose blood TEST BLOOD SUGAR(S) ONCE DAILY IN THE  MORNING   potassium chloride SA 20 MEQ tablet Commonly known as: [place]-Con M20 Take 1 tablet (20 mEq total) by mouth daily.   sertraline 100 MG tablet Commonly known as: ZOLOFT Take 1 tablet (100 mg total) by mouth daily.   simvastatin 80 MG tablet Commonly known as: ZOCOR TAKE 1 TABLET(80 MG) BY MOUTH DAILY   UNABLE TO FIND Med Name: Provertic as needed       Allergies:  Allergies  Allergen Reactions  . Codeine Nausea And Vomiting  . Metformin And Related Diarrhea  . Shrimp [Shellfish Allergy] Swelling    Past Medical History, Surgical history, Social history, and Family History were reviewed and updated.  Review of Systems: Review of Systems  Constitutional: Positive for malaise/fatigue.  HENT:  Negative.   Eyes: Negative.   Respiratory: Negative.   Cardiovascular: Negative.   Gastrointestinal: Positive for abdominal pain.  Genitourinary: Negative.   Musculoskeletal: Negative.   Skin: Negative.   Neurological: Negative.   Endo/Heme/Allergies: Negative.   Psychiatric/Behavioral: Negative.      Physical Exam:  weight is 191 lb (86.6 kg). Her oral temperature is 99.6 F (37.6 C). Her blood pressure is 146/62 (abnormal) and her pulse is 95. Her respiration is 19 and oxygen saturation is 96%.   Wt Readings from Last 3 Encounters:  11/20/20 191 lb (86.6 kg)  10/29/20 192 lb (87.1 kg)  09/23/20 192 lb (87.1 kg)    Physical Exam Vitals reviewed.  HENT:     Head: Normocephalic and atraumatic.  Eyes:     Pupils: Pupils are equal, round, and reactive to light.  Cardiovascular:     Rate and Rhythm: Normal rate and regular rhythm.     Heart sounds: Normal heart sounds.  Pulmonary:     Effort: Pulmonary effort is normal.     Breath sounds: Normal breath sounds.  Abdominal:     General: Bowel sounds are normal.     Palpations: Abdomen is soft.  Musculoskeletal:        General: No tenderness or deformity. Normal range of motion.     Cervical back: Normal range of motion.  Lymphadenopathy:     Cervical: No cervical adenopathy.  Skin:    General: Skin is warm and dry.     Findings: No erythema or rash.  Neurological:     Mental Status: She is alert and oriented to person, place, and time.  Psychiatric:        Behavior: Behavior normal.        Thought Content: Thought content normal.        Judgment: Judgment normal.      Lab Results  Component Value Date   WBC 5.2 11/20/2020   HGB 11.5 (L) 11/20/2020   HCT 35.2 (L) 11/20/2020   MCV 81.7 11/20/2020   PLT 70 (L) 11/20/2020   Lab Results  Component Value Date   FERRITIN 14 09/23/2020   IRON 71 09/23/2020   TIBC 480 (H) 09/23/2020   UIBC 409 (H) 09/23/2020   IRONPCTSAT 15 (L) 09/23/2020   Lab Results   Component Value Date   RETICCTPCT 2.2 07/02/2020   RBC 4.31 11/20/2020   No results found for: KPAFRELGTCHN, LAMBDASER, KAPLAMBRATIO No results found for: IGGSERUM, IGA, IGMSERUM No results found for: Ronnald Ramp, A1GS, A2GS, BETS, BETA2SER, GAMS, MSPIKE, SPEI   Chemistry      Component Value Date/Time   NA 140 11/20/2020 0958   NA 127 (L) 02/14/2017 1335   K 3.4 (L) 11/20/2020 3009  K 3.6 02/14/2017 1335   CL 97 (L) 11/20/2020 0958   CL 92 (L) 02/14/2017 1335   CO2 34 (H) 11/20/2020 0958   CO2 28 02/14/2017 1335   BUN 19 11/20/2020 0958   BUN 7 (L) 02/14/2017 1335   CREATININE 0.64 11/20/2020 0958   CREATININE 0.47 (L) 02/14/2017 1335   CREATININE 0.56 07/24/2014 1204      Component Value Date/Time   CALCIUM 9.8 11/20/2020 0958   CALCIUM 9.3 02/14/2017 1335   ALKPHOS 56 11/20/2020 0958   ALKPHOS 68 02/14/2017 1335   AST 20 11/20/2020 0958   ALT 12 11/20/2020 0958   BILITOT 0.9 11/20/2020 0958      Impression and Plan: Ms. Quijas is a very pleasant 80 yo caucasian female with 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    I think it would be worthwhile to get another splenic ultrasound.  I really need to see what her spleen sizes.  The last time we checked this was about a year ago.  Again, I think that her diabetes will dictate what happens to her in the future.  I will plan to get the ultrasound in about a month or so.  I will plan to see her back in about 2 months.   [name], MD 3/24/202211:00 AM

## 2020-11-20 NOTE — Telephone Encounter (Signed)
appts made and  Printed for pt, pt to get a call from [facility] for u./s   Rachael Buck

## 2020-11-21 LAB — IRON AND TIBC
Iron: 69 ug/dL (ref 41–142)
Saturation Ratios: 14 % — ABNORMAL LOW (ref 21–57)
TIBC: 496 ug/dL — ABNORMAL HIGH (ref 236–444)
UIBC: 427 ug/dL — ABNORMAL HIGH (ref 120–384)

## 2020-11-21 LAB — FERRITIN: Ferritin: 12 ng/mL (ref 11–307)

## 2020-11-24 ENCOUNTER — Telehealth: Payer: Self-pay

## 2020-11-24 NOTE — Telephone Encounter (Signed)
S/w pt per sch message and she is aware of her iron appt   anne

## 2020-11-25 ENCOUNTER — Inpatient Hospital Stay: Payer: PPO

## 2020-11-25 ENCOUNTER — Other Ambulatory Visit: Payer: Self-pay

## 2020-11-25 VITALS — BP 128/50 | HR 77 | Temp 98.2°F | Resp 18 | Ht 66.0 in | Wt 192.0 lb

## 2020-11-25 DIAGNOSIS — D696 Thrombocytopenia, unspecified: Secondary | ICD-10-CM

## 2020-11-25 LAB — JAK2 (INCLUDING V617F AND EXON 12), MPL,& CALR-NEXT GEN SEQ

## 2020-11-25 MED ORDER — SODIUM CHLORIDE 0.9 % IV SOLN
INTRAVENOUS | Status: DC
  Filled 2020-11-25: qty 250

## 2020-11-25 MED ORDER — SODIUM CHLORIDE 0.9 % IV SOLN
200.0000 mg | Freq: Once | INTRAVENOUS | Status: AC
  Administered 2020-11-25: 200 mg via INTRAVENOUS
  Filled 2020-11-25: qty 200

## 2020-12-09 ENCOUNTER — Other Ambulatory Visit: Payer: Self-pay

## 2020-12-09 ENCOUNTER — Encounter (HOSPITAL_COMMUNITY): Payer: Self-pay

## 2020-12-09 ENCOUNTER — Emergency Department (HOSPITAL_COMMUNITY): Payer: PPO

## 2020-12-09 ENCOUNTER — Inpatient Hospital Stay (HOSPITAL_COMMUNITY)
Admission: EM | Admit: 2020-12-09 | Discharge: 2020-12-15 | DRG: 871 | Disposition: A | Discharge status: Home Health | Payer: PPO | Attending: Internal Medicine | Admitting: Internal Medicine

## 2020-12-09 DIAGNOSIS — J9621 Acute and chronic respiratory failure with hypoxia: Secondary | ICD-10-CM

## 2020-12-09 DIAGNOSIS — R0902 Hypoxemia: Secondary | ICD-10-CM | POA: Diagnosis not present

## 2020-12-09 DIAGNOSIS — Z8719 Personal history of other diseases of the digestive system: Secondary | ICD-10-CM | POA: Diagnosis not present

## 2020-12-09 DIAGNOSIS — I10 Essential (primary) hypertension: Secondary | ICD-10-CM | POA: Diagnosis not present

## 2020-12-09 DIAGNOSIS — Z885 Allergy status to narcotic agent status: Secondary | ICD-10-CM

## 2020-12-09 DIAGNOSIS — K7581 Nonalcoholic steatohepatitis (NASH): Secondary | ICD-10-CM | POA: Diagnosis not present

## 2020-12-09 DIAGNOSIS — F419 Anxiety disorder, unspecified: Secondary | ICD-10-CM | POA: Diagnosis not present

## 2020-12-09 DIAGNOSIS — Z803 Family history of malignant neoplasm of breast: Secondary | ICD-10-CM

## 2020-12-09 DIAGNOSIS — J189 Pneumonia, unspecified organism: Secondary | ICD-10-CM

## 2020-12-09 DIAGNOSIS — I959 Hypotension, unspecified: Secondary | ICD-10-CM | POA: Diagnosis present

## 2020-12-09 DIAGNOSIS — Z87891 Personal history of nicotine dependence: Secondary | ICD-10-CM | POA: Diagnosis not present

## 2020-12-09 DIAGNOSIS — E1165 Type 2 diabetes mellitus with hyperglycemia: Secondary | ICD-10-CM | POA: Diagnosis not present

## 2020-12-09 DIAGNOSIS — Z91013 Allergy to seafood: Secondary | ICD-10-CM

## 2020-12-09 DIAGNOSIS — R61 Generalized hyperhidrosis: Secondary | ICD-10-CM | POA: Diagnosis present

## 2020-12-09 DIAGNOSIS — K219 Gastro-esophageal reflux disease without esophagitis: Secondary | ICD-10-CM | POA: Diagnosis present

## 2020-12-09 DIAGNOSIS — Z20822 Contact with and (suspected) exposure to covid-19: Secondary | ICD-10-CM | POA: Diagnosis not present

## 2020-12-09 DIAGNOSIS — Z833 Family history of diabetes mellitus: Secondary | ICD-10-CM

## 2020-12-09 DIAGNOSIS — N179 Acute kidney failure, unspecified: Secondary | ICD-10-CM | POA: Diagnosis not present

## 2020-12-09 DIAGNOSIS — Z7984 Long term (current) use of oral hypoglycemic drugs: Secondary | ICD-10-CM

## 2020-12-09 DIAGNOSIS — R000 Tachycardia, unspecified: Secondary | ICD-10-CM | POA: Diagnosis not present

## 2020-12-09 DIAGNOSIS — R062 Wheezing: Secondary | ICD-10-CM | POA: Diagnosis not present

## 2020-12-09 DIAGNOSIS — J181 Lobar pneumonia, unspecified organism: Secondary | ICD-10-CM | POA: Diagnosis present

## 2020-12-09 DIAGNOSIS — Z79899 Other long term (current) drug therapy: Secondary | ICD-10-CM

## 2020-12-09 DIAGNOSIS — E8770 Fluid overload, unspecified: Secondary | ICD-10-CM | POA: Diagnosis present

## 2020-12-09 DIAGNOSIS — K7460 Unspecified cirrhosis of liver: Secondary | ICD-10-CM | POA: Diagnosis present

## 2020-12-09 DIAGNOSIS — E785 Hyperlipidemia, unspecified: Secondary | ICD-10-CM | POA: Diagnosis present

## 2020-12-09 DIAGNOSIS — Z96652 Presence of left artificial knee joint: Secondary | ICD-10-CM | POA: Diagnosis not present

## 2020-12-09 DIAGNOSIS — A419 Sepsis, unspecified organism: Secondary | ICD-10-CM | POA: Diagnosis not present

## 2020-12-09 DIAGNOSIS — Z888 Allergy status to other drugs, medicaments and biological substances status: Secondary | ICD-10-CM

## 2020-12-09 DIAGNOSIS — J9602 Acute respiratory failure with hypercapnia: Secondary | ICD-10-CM | POA: Diagnosis not present

## 2020-12-09 DIAGNOSIS — R911 Solitary pulmonary nodule: Secondary | ICD-10-CM | POA: Diagnosis present

## 2020-12-09 DIAGNOSIS — D6959 Other secondary thrombocytopenia: Secondary | ICD-10-CM | POA: Diagnosis present

## 2020-12-09 DIAGNOSIS — E669 Obesity, unspecified: Secondary | ICD-10-CM | POA: Diagnosis present

## 2020-12-09 DIAGNOSIS — Z90711 Acquired absence of uterus with remaining cervical stump: Secondary | ICD-10-CM

## 2020-12-09 DIAGNOSIS — J440 Chronic obstructive pulmonary disease with acute lower respiratory infection: Secondary | ICD-10-CM | POA: Diagnosis not present

## 2020-12-09 DIAGNOSIS — G9341 Metabolic encephalopathy: Secondary | ICD-10-CM | POA: Diagnosis not present

## 2020-12-09 DIAGNOSIS — R6520 Severe sepsis without septic shock: Secondary | ICD-10-CM | POA: Diagnosis present

## 2020-12-09 DIAGNOSIS — IMO0002 Reserved for concepts with insufficient information to code with codable children: Secondary | ICD-10-CM | POA: Diagnosis present

## 2020-12-09 DIAGNOSIS — E876 Hypokalemia: Secondary | ICD-10-CM | POA: Diagnosis present

## 2020-12-09 DIAGNOSIS — Z800 Family history of malignant neoplasm of digestive organs: Secondary | ICD-10-CM | POA: Diagnosis not present

## 2020-12-09 DIAGNOSIS — Z66 Do not resuscitate: Secondary | ICD-10-CM | POA: Diagnosis present

## 2020-12-09 DIAGNOSIS — J302 Other seasonal allergic rhinitis: Secondary | ICD-10-CM | POA: Diagnosis present

## 2020-12-09 DIAGNOSIS — Z9049 Acquired absence of other specified parts of digestive tract: Secondary | ICD-10-CM

## 2020-12-09 DIAGNOSIS — J9601 Acute respiratory failure with hypoxia: Secondary | ICD-10-CM | POA: Diagnosis present

## 2020-12-09 DIAGNOSIS — R0602 Shortness of breath: Secondary | ICD-10-CM | POA: Diagnosis not present

## 2020-12-09 DIAGNOSIS — J90 Pleural effusion, not elsewhere classified: Secondary | ICD-10-CM | POA: Diagnosis not present

## 2020-12-09 LAB — I-STAT VENOUS BLOOD GAS, ED
Acid-base deficit: 2 mmol/L (ref 0.0–2.0)
Bicarbonate: 21.7 mmol/L (ref 20.0–28.0)
Calcium, Ion: 0.98 mmol/L — ABNORMAL LOW (ref 1.15–1.40)
HCT: 37 % (ref 36.0–46.0)
Hemoglobin: 12.6 g/dL (ref 12.0–15.0)
O2 Saturation: 90 %
Potassium: 2.2 mmol/L — CL (ref 3.5–5.1)
Sodium: 138 mmol/L (ref 135–145)
TCO2: 23 mmol/L (ref 22–32)
pCO2, Ven: 31.5 mmHg — ABNORMAL LOW (ref 44.0–60.0)
pH, Ven: 7.447 — ABNORMAL HIGH (ref 7.250–7.430)
pO2, Ven: 56 mmHg — ABNORMAL HIGH (ref 32.0–45.0)

## 2020-12-09 LAB — I-STAT CHEM 8, ED
BUN: 18 mg/dL (ref 8–23)
Calcium, Ion: 1.04 mmol/L — ABNORMAL LOW (ref 1.15–1.40)
Chloride: 98 mmol/L (ref 98–111)
Creatinine, Ser: 1 mg/dL (ref 0.44–1.00)
Glucose, Bld: 124 mg/dL — ABNORMAL HIGH (ref 70–99)
HCT: 37 % (ref 36.0–46.0)
Hemoglobin: 12.6 g/dL (ref 12.0–15.0)
Potassium: 2.4 mmol/L — CL (ref 3.5–5.1)
Sodium: 138 mmol/L (ref 135–145)
TCO2: 24 mmol/L (ref 22–32)

## 2020-12-09 LAB — BLOOD GAS, VENOUS
Acid-base deficit: 2 mmol/L (ref 0.0–2.0)
Bicarbonate: 24.7 mmol/L (ref 20.0–28.0)
FIO2: 21
O2 Saturation: 84.3 %
Patient temperature: 37
pCO2, Ven: 62.4 mmHg — ABNORMAL HIGH (ref 44.0–60.0)
pH, Ven: 7.222 — ABNORMAL LOW (ref 7.250–7.430)
pO2, Ven: 60.8 mmHg — ABNORMAL HIGH (ref 32.0–45.0)

## 2020-12-09 LAB — CBC WITH DIFFERENTIAL/PLATELET
Abs Immature Granulocytes: 0.52 10*3/uL — ABNORMAL HIGH (ref 0.00–0.07)
Basophils Absolute: 0.1 10*3/uL (ref 0.0–0.1)
Basophils Relative: 0 %
Eosinophils Absolute: 0 10*3/uL (ref 0.0–0.5)
Eosinophils Relative: 0 %
HCT: 38.2 % (ref 36.0–46.0)
Hemoglobin: 12.3 g/dL (ref 12.0–15.0)
Immature Granulocytes: 2 %
Lymphocytes Relative: 4 %
Lymphs Abs: 1.1 10*3/uL (ref 0.7–4.0)
MCH: 27.1 pg (ref 26.0–34.0)
MCHC: 32.2 g/dL (ref 30.0–36.0)
MCV: 84.1 fL (ref 80.0–100.0)
Monocytes Absolute: 2 10*3/uL — ABNORMAL HIGH (ref 0.1–1.0)
Monocytes Relative: 7 %
Neutro Abs: 25.2 10*3/uL — ABNORMAL HIGH (ref 1.7–7.7)
Neutrophils Relative %: 87 %
Platelets: 82 10*3/uL — ABNORMAL LOW (ref 150–400)
RBC: 4.54 MIL/uL (ref 3.87–5.11)
RDW: 17.6 % — ABNORMAL HIGH (ref 11.5–15.5)
WBC: 28.9 10*3/uL — ABNORMAL HIGH (ref 4.0–10.5)
nRBC: 0 % (ref 0.0–0.2)

## 2020-12-09 LAB — RESP PANEL BY RT-PCR (FLU A&B, COVID) ARPGX2
Influenza A by PCR: NEGATIVE
Influenza B by PCR: NEGATIVE
SARS Coronavirus 2 by RT PCR: NEGATIVE

## 2020-12-09 LAB — COMPREHENSIVE METABOLIC PANEL
ALT: 14 U/L (ref 0–44)
AST: 34 U/L (ref 15–41)
Albumin: 3.2 g/dL — ABNORMAL LOW (ref 3.5–5.0)
Alkaline Phosphatase: 51 U/L (ref 38–126)
Anion gap: 16 — ABNORMAL HIGH (ref 5–15)
BUN: 16 mg/dL (ref 8–23)
CO2: 21 mmol/L — ABNORMAL LOW (ref 22–32)
Calcium: 8.4 mg/dL — ABNORMAL LOW (ref 8.9–10.3)
Chloride: 99 mmol/L (ref 98–111)
Creatinine, Ser: 1.25 mg/dL — ABNORMAL HIGH (ref 0.44–1.00)
GFR, Estimated: 44 mL/min — ABNORMAL LOW (ref >60)
Glucose, Bld: 129 mg/dL — ABNORMAL HIGH (ref 70–99)
Potassium: 2.3 mmol/L — CL (ref 3.5–5.1)
Sodium: 136 mmol/L (ref 135–145)
Total Bilirubin: 1.8 mg/dL — ABNORMAL HIGH (ref 0.3–1.2)
Total Protein: 6.2 g/dL — ABNORMAL LOW (ref 6.5–8.1)

## 2020-12-09 LAB — LACTIC ACID, PLASMA
Lactic Acid, Venous: 7.4 mmol/L (ref 0.5–1.9)
Lactic Acid, Venous: 5.7 mmol/L (ref 0.5–1.9)

## 2020-12-09 LAB — TROPONIN I (HIGH SENSITIVITY)
Troponin I (High Sensitivity): 43 ng/L — ABNORMAL HIGH (ref <18)
Troponin I (High Sensitivity): 58 ng/L — ABNORMAL HIGH (ref <18)

## 2020-12-09 LAB — MAGNESIUM: Magnesium: 1.2 mg/dL — ABNORMAL LOW (ref 1.7–2.4)

## 2020-12-09 LAB — GLUCOSE, CAPILLARY: Glucose-Capillary: 156 mg/dL — ABNORMAL HIGH (ref 70–99)

## 2020-12-09 MED ORDER — DIPHENHYDRAMINE HCL 25 MG PO CAPS: 25.0000 mg | ORAL_CAPSULE | Freq: Four times a day (QID) | ORAL | Status: DC | PRN

## 2020-12-09 MED ORDER — GLIPIZIDE ER 5 MG PO TB24
5.0000 mg | ORAL_TABLET | Freq: Every day | ORAL | Status: DC
  Administered 2020-12-10 – 2020-12-15 (×6): 5 mg via ORAL
  Filled 2020-12-09 (×6): qty 1

## 2020-12-09 MED ORDER — SODIUM CHLORIDE 0.9 % IV BOLUS
1000.0000 mL | Freq: Once | INTRAVENOUS | Status: AC
  Administered 2020-12-09: 1000 mL via INTRAVENOUS

## 2020-12-09 MED ORDER — INSULIN ASPART 100 UNIT/ML ~~LOC~~ SOLN
0.0000 [IU] | Freq: Three times a day (TID) | SUBCUTANEOUS | Status: DC
  Administered 2020-12-10: 2 [IU] via SUBCUTANEOUS
  Administered 2020-12-10 – 2020-12-12 (×3): 3 [IU] via SUBCUTANEOUS
  Administered 2020-12-13 (×2): 2 [IU] via SUBCUTANEOUS
  Administered 2020-12-14 – 2020-12-15 (×3): 3 [IU] via SUBCUTANEOUS
  Administered 2020-12-15: 2 [IU] via SUBCUTANEOUS

## 2020-12-09 MED ORDER — EMPAGLIFLOZIN 10 MG PO TABS
10.0000 mg | ORAL_TABLET | Freq: Every day | ORAL | Status: DC
  Administered 2020-12-10 – 2020-12-15 (×6): 10 mg via ORAL
  Filled 2020-12-09 (×6): qty 1

## 2020-12-09 MED ORDER — MAGNESIUM SULFATE 2 GM/50ML IV SOLN
2.0000 g | Freq: Once | INTRAVENOUS | Status: AC
  Administered 2020-12-09: 2 g via INTRAVENOUS
  Filled 2020-12-09: qty 50

## 2020-12-09 MED ORDER — SODIUM CHLORIDE 0.9 % IV SOLN
500.0000 mg | INTRAVENOUS | Status: DC
  Filled 2020-12-09: qty 500

## 2020-12-09 MED ORDER — SODIUM CHLORIDE 0.9 % IV SOLN
500.0000 mg | Freq: Once | INTRAVENOUS | Status: AC
  Administered 2020-12-09: 500 mg via INTRAVENOUS
  Filled 2020-12-09: qty 500

## 2020-12-09 MED ORDER — POTASSIUM CHLORIDE CRYS ER 20 MEQ PO TBCR: 20.0000 meq | EXTENDED_RELEASE_TABLET | Freq: Every day | ORAL | Status: DC

## 2020-12-09 MED ORDER — ACETAMINOPHEN 650 MG RE SUPP: 650.0000 mg | Freq: Four times a day (QID) | RECTAL | Status: DC | PRN

## 2020-12-09 MED ORDER — TRAMADOL HCL 50 MG PO TABS: 50.0000 mg | ORAL_TABLET | Freq: Four times a day (QID) | ORAL | Status: DC | PRN

## 2020-12-09 MED ORDER — ENOXAPARIN SODIUM 40 MG/0.4ML ~~LOC~~ SOLN
40.0000 mg | SUBCUTANEOUS | Status: DC
  Administered 2020-12-09 – 2020-12-10 (×2): 40 mg via SUBCUTANEOUS
  Filled 2020-12-09 (×2): qty 0.4

## 2020-12-09 MED ORDER — ONDANSETRON HCL 4 MG PO TABS: 4.0000 mg | ORAL_TABLET | Freq: Four times a day (QID) | ORAL | Status: DC | PRN

## 2020-12-09 MED ORDER — HYDROCORTISONE 1 % EX CREA
TOPICAL_CREAM | Freq: Two times a day (BID) | CUTANEOUS | Status: DC
  Filled 2020-12-09: qty 28

## 2020-12-09 MED ORDER — FUROSEMIDE 10 MG/ML IJ SOLN
40.0000 mg | Freq: Once | INTRAMUSCULAR | Status: AC
  Administered 2020-12-09: 40 mg via INTRAVENOUS
  Filled 2020-12-09: qty 4

## 2020-12-09 MED ORDER — IOHEXOL 350 MG/ML SOLN
50.0000 mL | Freq: Once | INTRAVENOUS | Status: AC | PRN
  Administered 2020-12-09: 50 mL via INTRAVENOUS

## 2020-12-09 MED ORDER — CEFTRIAXONE SODIUM 2 G IJ SOLR
2.0000 g | INTRAMUSCULAR | Status: DC
  Filled 2020-12-09: qty 20

## 2020-12-09 MED ORDER — SODIUM CHLORIDE 0.9 % IV SOLN
1.0000 g | Freq: Once | INTRAVENOUS | Status: AC
  Administered 2020-12-09: 1 g via INTRAVENOUS
  Filled 2020-12-09: qty 10

## 2020-12-09 MED ORDER — COLESTIPOL HCL 1 G PO TABS
1.0000 g | ORAL_TABLET | Freq: Every day | ORAL | Status: DC
  Administered 2020-12-10 – 2020-12-15 (×6): 1 g via ORAL
  Filled 2020-12-09 (×6): qty 1

## 2020-12-09 MED ORDER — ALBUTEROL SULFATE HFA 108 (90 BASE) MCG/ACT IN AERS
2.0000 | INHALATION_SPRAY | Freq: Once | RESPIRATORY_TRACT | Status: AC
  Administered 2020-12-09: 2 via RESPIRATORY_TRACT
  Filled 2020-12-09: qty 6.7

## 2020-12-09 MED ORDER — PANTOPRAZOLE SODIUM 40 MG PO TBEC
40.0000 mg | DELAYED_RELEASE_TABLET | Freq: Every day | ORAL | Status: DC
  Administered 2020-12-10 – 2020-12-15 (×6): 40 mg via ORAL
  Filled 2020-12-09 (×6): qty 1

## 2020-12-09 MED ORDER — ONDANSETRON HCL 4 MG/2ML IJ SOLN: 4.0000 mg | Freq: Four times a day (QID) | INTRAMUSCULAR | Status: DC | PRN

## 2020-12-09 MED ORDER — LOPERAMIDE HCL 2 MG PO CAPS
4.0000 mg | ORAL_CAPSULE | Freq: Every day | ORAL | Status: DC | PRN
  Administered 2020-12-10 – 2020-12-15 (×4): 4 mg via ORAL
  Filled 2020-12-09 (×4): qty 2

## 2020-12-09 MED ORDER — DICYCLOMINE HCL 20 MG PO TABS
20.0000 mg | ORAL_TABLET | Freq: Three times a day (TID) | ORAL | Status: DC
  Administered 2020-12-10 – 2020-12-15 (×21): 20 mg via ORAL
  Filled 2020-12-09 (×24): qty 1

## 2020-12-09 MED ORDER — ACETAMINOPHEN 325 MG PO TABS: 650.0000 mg | ORAL_TABLET | Freq: Four times a day (QID) | ORAL | Status: DC | PRN

## 2020-12-09 MED ORDER — SERTRALINE HCL 100 MG PO TABS
100.0000 mg | ORAL_TABLET | Freq: Every day | ORAL | Status: DC
  Administered 2020-12-10 – 2020-12-15 (×6): 100 mg via ORAL
  Filled 2020-12-09 (×6): qty 1

## 2020-12-09 MED ORDER — POTASSIUM CHLORIDE CRYS ER 20 MEQ PO TBCR
40.0000 meq | EXTENDED_RELEASE_TABLET | Freq: Three times a day (TID) | ORAL | Status: DC
  Administered 2020-12-09: 40 meq via ORAL
  Filled 2020-12-09: qty 2

## 2020-12-09 MED ORDER — ATORVASTATIN CALCIUM 40 MG PO TABS
40.0000 mg | ORAL_TABLET | Freq: Every day | ORAL | Status: DC
  Administered 2020-12-10 – 2020-12-15 (×6): 40 mg via ORAL
  Filled 2020-12-09 (×6): qty 1

## 2020-12-09 MED ORDER — LACTATED RINGERS IV SOLN: INTRAVENOUS | Status: DC

## 2020-12-09 MED ORDER — GUAIFENESIN ER 600 MG PO TB12
600.0000 mg | ORAL_TABLET | Freq: Two times a day (BID) | ORAL | Status: DC
  Administered 2020-12-09 – 2020-12-15 (×12): 600 mg via ORAL
  Filled 2020-12-09 (×12): qty 1

## 2020-12-09 MED ORDER — ALPRAZOLAM 0.5 MG PO TABS
0.5000 mg | ORAL_TABLET | Freq: Three times a day (TID) | ORAL | Status: DC | PRN
  Administered 2020-12-10 – 2020-12-14 (×8): 0.5 mg via ORAL
  Filled 2020-12-09 (×8): qty 1

## 2020-12-09 MED ORDER — POTASSIUM CHLORIDE 10 MEQ/100ML IV SOLN
10.0000 meq | INTRAVENOUS | Status: AC
  Administered 2020-12-09 (×3): 10 meq via INTRAVENOUS
  Filled 2020-12-09 (×3): qty 100

## 2020-12-09 MED ORDER — INSULIN ASPART 100 UNIT/ML ~~LOC~~ SOLN: 0.0000 [IU] | Freq: Every day | SUBCUTANEOUS | Status: DC

## 2020-12-09 MED ORDER — ALBUTEROL SULFATE (2.5 MG/3ML) 0.083% IN NEBU
3.0000 mL | INHALATION_SOLUTION | RESPIRATORY_TRACT | Status: DC | PRN
  Administered 2020-12-10 – 2020-12-14 (×3): 3 mL via RESPIRATORY_TRACT
  Filled 2020-12-09 (×3): qty 3

## 2020-12-09 MED ORDER — METHYLPREDNISOLONE SODIUM SUCC 125 MG IJ SOLR
125.0000 mg | Freq: Once | INTRAMUSCULAR | Status: AC
  Administered 2020-12-09: 125 mg via INTRAVENOUS
  Filled 2020-12-09: qty 2

## 2020-12-09 NOTE — ED Notes (Signed)
Patient hypotensive 74/43. Provider made awre.

## 2020-12-09 NOTE — ED Notes (Addendum)
Patients lungs sound wet at this time. Current fluids stopped. Patient also c/o increased SOB and coughing up blood tinged sputum. YAO MD made aware.

## 2020-12-09 NOTE — H&P (Signed)
History and Physical   Rachael Buck YHC:[id number] DOB: 1940/09/12 DOA: 12/09/2020  Referring MD/NP/PA: Dr. Darl Householder  PCP: Lorelei Pont Gay Filler, MD   Outpatient Specialists: Dr. Burney Gauze  Patient coming from: Home  Chief Complaint: Shortness of breath  HPI: Rachael Buck is a 80 y.o. female with medical history significant of NASH cirrhosis, diabetes, hypertension, GERD, hyperlipidemia, morbid obesity, asthma and anxiety disorder who presented to the ER with progressive shortness of breath and cough for a few days.  Symptoms have persisted both at rest and with activity.  Patient has been taking her breathing treatments at home.  This has not improved her symptoms.  She continues to have cough but no fever.  No chills.  Patient came to the ER for evaluation.  She was given breathing treatment was in the ER with still no significant response.  She was brought in by EMS who also administer treatment in route.  Patient is fully vaccinated against COVID-19 but no booster dose.  She appears to have multilobar pneumonia here.  She is being admitted to the hospital for further evaluation and treatment..  ED Course: Temperature is 99.7 77.  43 pulse 123 respiratory 28 oxygen sats 90% on room air.  White count is 28.9 hemoglobin 12.6 and platelets of 82.  Potassium 2.4 CO2 21 calcium 8.4.  Venous pH was 7.22.  Lactic acid 5.7.  CT angiogram of the chest showed no PE but right middle lobe and right upper lobe infiltrates.  There is mild right basilar atelectasis on or infiltrate.  Small right pleural effusion.  Patient also has a 1.9 x 1.2 cm right infrahilar soft tissue nodule as well as evidence of cirrhotic liver.  She has been admitted to the hospital for sepsis secondary to pneumonia.  Review of Systems: As per HPI otherwise 10 point review of systems negative.    Past Medical History:  Diagnosis Date  . Anxiety   . Arthritis   . Asthma   . Cirrhosis, nonalcoholic ([place])   . Colon polyps    . Diabetes mellitus   . Diverticulitis   . GERD (gastroesophageal reflux disease)   . Hyperlipidemia   . Hypertension   . NASH (nonalcoholic steatohepatitis)   . Obesity   . Pneumonia     Past Surgical History:  Procedure Laterality Date  . CHOLECYSTECTOMY  1981  . ENDOVENOUS ABLATION SAPHENOUS VEIN W/ LASER Left 02/21/2018   endovenous laser ablation left greater saphenous vein by Tinnie Gens MD   . [place]  . PARTIAL HYSTERECTOMY  1972  . ROTATOR CUFF REPAIR     bilateral. 2006, 2008  . 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 TOTAL KNEE ARTHROPLASTY Left 02/18/2017   Procedure: LEFT TOTAL KNEE ARTHROPLASTY;  Surgeon: Netta Cedars, MD;  Location: [facility];  Service: Orthopedics;  Laterality: Left;  Marland Kitchen VEIN SURGERY       reports that she has quit smoking. She has never used smokeless tobacco. She reports that she does not drink alcohol and does not use drugs.  Allergies  Allergen Reactions  . Codeine Nausea And Vomiting  . Metformin And Related Diarrhea  . Shrimp [Shellfish Allergy] Swelling    Family History  Problem Relation Age of Onset  . Breast cancer Daughter   . Breast cancer Maternal Aunt   . Diabetes Brother   . Diabetes Sister   . Stomach cancer Mother   . Cancer Other   . GER disease Other   . Obesity  Other      Prior to Admission medications   Medication Sig Start Date End Date Taking? Authorizing Provider  albuterol (PROAIR HFA) 108 (90 Base) MCG/ACT inhaler Inhale 2 puffs into the lungs every 4 (four) hours as needed for wheezing or shortness of breath. 09/02/16   Midge Minium, MD  ALPRAZolam Duanne Moron) 0.5 MG tablet TAKE 1 TABLET(0.5 MG) BY MOUTH THREE TIMES DAILY 11/14/20   Copland, Gay Filler, MD  blood glucose meter kit and supplies KIT Dispense based on patient and insurance preference. Use up to four times daily as directed. 10/29/20   Copland, Gay Filler, MD  colestipol (COLESTID) 1 g tablet Take 1 g by mouth daily.    [provider]  dicyclomine (BENTYL) 20 MG tablet Take 1 tablet (20 mg total) by mouth 3 (three) times daily as needed. 03/04/20   Copland, Gay Filler, MD  diphenhydrAMINE (BENADRYL) 25 MG tablet Take 25 mg by mouth every 6 (six) hours as needed (for allergies.).    [provider]  empagliflozin (JARDIANCE) 10 MG TABS tablet Take 10 mg by mouth daily. 11/26/19   Copland, Gay Filler, MD  glipiZIDE (GLUCOTROL XL) 5 MG 24 hr tablet Take 1 tablet (5 mg total) by mouth daily with breakfast. 12/28/19   Copland, Gay Filler, MD  hydrochlorothiazide (HYDRODIURIL) 25 MG tablet TAKE 1 TABLET(25 MG) BY MOUTH DAILY 04/28/20   Copland, Gay Filler, MD  hydrocortisone 2.5 % cream Apply topically as needed.    [provider]  loperamide (IMODIUM) 2 MG capsule Take 4-6 mg by mouth daily as needed (for IBS symptoms).    [provider]  omeprazole (PRILOSEC) 40 MG capsule TAKE 1 CAPSULE(40 MG) BY MOUTH DAILY 04/28/20   Copland, Gay Filler, MD  [facility] DELICA LANCETS FINE MISC 1 each by Other route daily. 05/18/18   Copland, Gay Filler, MD  ONETOUCH ULTRA test strip TEST BLOOD SUGAR(S) ONCE DAILY IN THE MORNING 10/09/20   Copland, Gay Filler, MD  potassium chloride SA (KLOR-CON M20) 20 MEQ tablet Take 1 tablet (20 mEq total) by mouth daily. 01/31/20   Copland, Gay Filler, MD  sertraline (ZOLOFT) 100 MG tablet Take 1 tablet (100 mg total) by mouth daily. 07/15/20   Copland, Gay Filler, MD  simvastatin (ZOCOR) 80 MG tablet TAKE 1 TABLET(80 MG) BY MOUTH DAILY 07/29/20   Copland, Gay Filler, MD  traMADol (ULTRAM) 50 MG tablet Take 1 tablet (50 mg total) by mouth every 6 (six) hours as needed. 11/20/20   [name], MD  UNABLE TO FIND Med Name: Provertic as needed    [provider]    Physical Exam: Vitals:   12/09/20 2030 12/09/20 2045 12/09/20 2118 12/09/20 2253  BP: (!) 128/99 103/77 (!) 160/66 (!) 128/59  Pulse: (!) 108 (!) 106 (!) 118 (!) 106  Resp: 20 (!) 28 (!) 22   Temp:   98.3 F  (36.8 C)   TempSrc:   Oral   SpO2: 96% 98% 94% 97%  Weight:   89.4 kg   Height:   5' 6"  (1.676 m)       Constitutional: Chronically ill looking with no distress Vitals:   12/09/20 2030 12/09/20 2045 12/09/20 2118 12/09/20 2253  BP: (!) 128/99 103/77 (!) 160/66 (!) 128/59  Pulse: (!) 108 (!) 106 (!) 118 (!) 106  Resp: 20 (!) 28 (!) 22   Temp:   98.3 F (36.8 C)   TempSrc:   Oral   SpO2: 96%  98% 94% 97%  Weight:   89.4 kg   Height:   5' 6"  (1.676 m)    Eyes: PERRL, lids and conjunctivae normal ENMT: Mucous membranes are moist. Posterior pharynx clear of any exudate or lesions.Normal dentition.  Neck: normal, supple, no masses, no thyromegaly Respiratory: Coarse breath sound bilaterally with rhonchi, mild expiratory wheezing, crackles at the bases, increased respiratory effort. No accessory muscle use.  Cardiovascular: Sinus tachycardia, no murmurs / rubs / gallops.  1+ extremity edema 2+ pedal pulses. No carotid bruits.  Abdomen: no tenderness, no masses palpated. No hepatosplenomegaly. Bowel sounds positive.  Musculoskeletal: no clubbing / cyanosis. No joint deformity upper and lower extremities. Good ROM, no contractures. Normal muscle tone.  Skin: no rashes, lesions, ulcers. No induration Neurologic: CN 2-12 grossly intact. Sensation intact, DTR normal. Strength 5/5 in all 4.  Psychiatric: Normal judgment and insight. Alert and oriented x 3. Normal mood.     Labs on Admission: I have personally reviewed following labs and imaging studies  CBC: Recent Labs  Lab 12/09/20 1638 12/09/20 1720 12/09/20 1816  WBC 28.9*  --   --   NEUTROABS 25.2*  --   --   HGB 12.3 12.6 12.6  HCT 38.2 37.0 37.0  MCV 84.1  --   --   PLT 82*  --   --    Basic Metabolic Panel: Recent Labs  Lab 12/09/20 1638 12/09/20 1720 12/09/20 1816  NA 136 138 138  K 2.3* 2.2* 2.4*  CL 99  --  98  CO2 21*  --   --   GLUCOSE 129*  --  124*  BUN 16  --  18  CREATININE 1.25*  --  1.00  CALCIUM  8.4*  --   --   MG 1.2*  --   --    GFR: Estimated Creatinine Clearance: 52.2 mL/min (by C-G formula based on SCr of 1 mg/dL). Liver Function Tests: Recent Labs  Lab 12/09/20 1638  AST 34  ALT 14  ALKPHOS 51  BILITOT 1.8*  PROT 6.2*  ALBUMIN 3.2*   No results for input(s): LIPASE, AMYLASE in the last 168 hours. No results for input(s): AMMONIA in the last 168 hours. Coagulation Profile: No results for input(s): INR, PROTIME in the last 168 hours. Cardiac Enzymes: No results for input(s): CKTOTAL, CKMB, CKMBINDEX, TROPONINI in the last 168 hours. BNP (last 3 results) No results for input(s): PROBNP in the last 8760 hours. HbA1C: No results for input(s): HGBA1C in the last 72 hours. CBG: Recent Labs  Lab 12/09/20 2122  GLUCAP 156*   Lipid Profile: No results for input(s): CHOL, HDL, LDLCALC, TRIG, CHOLHDL, LDLDIRECT in the last 72 hours. Thyroid Function Tests: No results for input(s): TSH, T4TOTAL, FREET4, T3FREE, THYROIDAB in the last 72 hours. Anemia Panel: No results for input(s): VITAMINB12, FOLATE, FERRITIN, TIBC, IRON, RETICCTPCT in the last 72 hours. Urine analysis:    Component Value Date/Time   COLORURINE YELLOW 12/07/2019 1245   APPEARANCEUR CLEAR 12/07/2019 1245   LABSPEC 1.010 12/07/2019 1245   PHURINE 7.0 12/07/2019 1245   GLUCOSEU >=500 (A) 12/07/2019 1245   HGBUR NEGATIVE 12/07/2019 1245   BILIRUBINUR NEGATIVE 12/07/2019 1245   BILIRUBINUR negative 09/20/2017 1009   KETONESUR NEGATIVE 12/07/2019 1245   PROTEINUR NEGATIVE 12/07/2019 1245   UROBILINOGEN 0.2 09/20/2017 1009   UROBILINOGEN 1.0 12/04/2013 1401   NITRITE NEGATIVE 12/07/2019 1245   LEUKOCYTESUR TRACE (A) 12/07/2019 1245   Sepsis Labs: @LABRCNTIP (procalcitonin:4,lacticidven:4) ) Recent Results (from the past  240 hour(s))  Resp Panel by RT-PCR (Flu A&B, Covid) Nasopharyngeal Swab     Status: None   Collection Time: 12/09/20  5:15 PM   Specimen: Nasopharyngeal Swab; Nasopharyngeal(NP)  swabs in vial transport medium  Result Value Ref Range Status   SARS Coronavirus 2 by RT PCR NEGATIVE NEGATIVE Final    Comment: (NOTE) SARS-CoV-2 target nucleic acids are NOT DETECTED.  The SARS-CoV-2 RNA is generally detectable in upper respiratory specimens during the acute phase of infection. The lowest concentration of SARS-CoV-2 viral copies this assay can detect is 138 copies/mL. A negative result does not preclude SARS-Cov-2 infection and should not be used as the sole basis for treatment or other patient management decisions. A negative result may occur with  improper specimen collection/handling, submission of specimen other than nasopharyngeal swab, presence of viral mutation(s) within the areas targeted by this assay, and inadequate number of viral copies(<138 copies/mL). A negative result must be combined with clinical observations, patient history, and epidemiological information. The expected result is Negative.  Fact Sheet for Patients:  EntrepreneurPulse.com.au  Fact Sheet for Healthcare Providers:  IncredibleEmployment.be  This test is no t yet approved or cleared by the [place] FDA and  has been authorized for detection and/or diagnosis of SARS-CoV-2 by FDA under an Emergency Use Authorization (EUA). This EUA will remain  in effect (meaning this test can be used) for the duration of the COVID-19 declaration under Section 564(b)(1) of the Act, 21 U.S.C.section 360bbb-3(b)(1), unless the authorization is terminated  or revoked sooner.       Influenza A by PCR NEGATIVE NEGATIVE Final   Influenza B by PCR NEGATIVE NEGATIVE Final    Comment: (NOTE) The Xpert Xpress SARS-CoV-2/FLU/RSV plus assay is intended as an aid in the diagnosis of influenza from Nasopharyngeal swab specimens and should not be used as a sole basis for treatment. Nasal washings and aspirates are unacceptable for Xpert Xpress  SARS-CoV-2/FLU/RSV testing.  Fact Sheet for Patients: EntrepreneurPulse.com.au  Fact Sheet for Healthcare Providers: IncredibleEmployment.be  This test is not yet approved or cleared by the [place] FDA and has been authorized for detection and/or diagnosis of SARS-CoV-2 by FDA under an Emergency Use Authorization (EUA). This EUA will remain in effect (meaning this test can be used) for the duration of the COVID-19 declaration under Section 564(b)(1) of the Act, 21 U.S.C. section 360bbb-3(b)(1), unless the authorization is terminated or revoked.  Performed at [facility], [place] [address] [place], [place] [address]      Radiological Exams on Admission: CT Angio Chest PE W and/or Wo Contrast  Result Date: 12/09/2020 CLINICAL DATA:  Shortness of breath. EXAM: CT ANGIOGRAPHY CHEST WITH CONTRAST TECHNIQUE: Multidetector CT imaging of the chest was performed using the standard protocol during bolus administration of intravenous contrast. Multiplanar CT image reconstructions and MIPs were obtained to evaluate the vascular anatomy. CONTRAST:  42m OMNIPAQUE IOHEXOL 350 MG/ML SOLN COMPARISON:  None. FINDINGS: Cardiovascular: There is marked severity calcification of the aortic arch and descending thoracic aorta, without evidence of aneurysmal dilatation. Satisfactory opacification of the pulmonary arteries to the segmental level. No evidence of pulmonary embolism. Normal heart size with moderate severity coronary artery calcification. No pericardial effusion. Mediastinum/Nodes: No enlarged mediastinal, hilar, or axillary lymph nodes. Thyroid gland, trachea, and esophagus demonstrate no significant findings. Lungs/Pleura: Marked severity infiltrate is seen throughout the right middle lobe and posterior aspect of the right upper lobe. Mild, posterior right basilar atelectasis and/or infiltrate is seen. A 1.9 cm  x 1.3 cm noncalcified, nonenhancing soft  tissue nodule is seen along the infrahilar aspect of the right lower lobe (axial CT images 57 through 62, CT series number 5). There is a small right pleural effusion. No pneumothorax is identified. Upper Abdomen: The liver is cirrhotic in appearance. Surgical clips are noted within the gallbladder fossa. Musculoskeletal: Degenerative changes are seen throughout the thoracic spine. Review of the MIP images confirms the above findings. IMPRESSION: 1. Marked severity right middle lobe and right upper lobe infiltrate. 2. Mild right basilar atelectasis and/or infiltrate. 3. Small right pleural effusion. 4. 1.9 cm x 1.3 cm right infrahilar soft tissue nodule. Consider one of the following in 3 months for both low-risk and high-risk individuals: (a) repeat chest CT, (b) follow-up PET-CT, or (c) tissue sampling. This recommendation follows the consensus statement: Guidelines for Management of Incidental Pulmonary Nodules Detected on CT Images: From the Fleischner Society 2017; Radiology 2017; 284:228-243. 5. Cirrhotic liver. 6. Aortic atherosclerosis. Aortic Atherosclerosis (ICD10-I70.0). Electronically Signed   By: Virgina Norfolk M.D.   On: 12/09/2020 19:32   DG Chest Port 1 View  Result Date: 12/09/2020 CLINICAL DATA:  Short of breath for 3 days, history of asthma and diabetes EXAM: PORTABLE CHEST 1 VIEW COMPARISON:  12/07/2019 FINDINGS: 2 frontal views of the chest demonstrate an unremarkable cardiac silhouette. There is diffuse right-sided airspace disease greatest in the mid and lower lung zones, likely involving the right middle and right lower lobes. No large effusion or pneumothorax. No acute bony abnormalities. IMPRESSION: 1. Significant right-sided airspace disease consistent with right middle and right lower lobe pneumonia. Followup PA and lateral chest X-ray is recommended in 3-4 weeks following trial of antibiotic therapy to ensure resolution and exclude underlying malignancy. Electronically Signed    By: Randa Ngo M.D.   On: 12/09/2020 17:06    EKG: Independently reviewed.  Sinus tachycardia  Assessment/Plan Principal Problem:   Severe sepsis (HCC) Active Problems:   Uncontrolled diabetes mellitus (HCC)   Obesity, unspecified   HTN (hypertension)   Cirrhosis of liver (HCC)   Lobar pneumonia (HCC)   Hypokalemia     #1 severe sepsis secondary to pneumonia: Patient meets sepsis criteria with white count, tachycardia, respiratory rate.  She meets severe sepsis with her hypotension on arrival.  She is now beginning to respond and blood pressure has rebounded with fluids.  Admit the patient and cover with IV antibiotics.  Patient feels crackly and slightly fluid overloaded so we will hold off on more fluids for now.  Cultures have been obtained especially blood cultures.  Follow closely and adjust accordingly.  #2  Multilobar pneumonia: As per above.  #3 [name] cirrhosis: At baseline.  Patient has chronic thrombocytopenia and other telltale signs of cirrhosis.  Continue monitoring  #4 essential hypertension: Blood pressure was low on arrival.  Holding medications but restart once blood pressure rebounds.  #5 morbid obesity: Counseling provided.  #6 severe hypokalemia: Potassium 2.6.  Aggressively replete.  #7 diabetes: Sliding scale insulin with home regimen.  Continue monitoring   DVT prophylaxis: SCD Code Status: DNR Family Communication: No family at bedside Disposition Plan: To be determined Consults called: None Admission status: Inpatient to progressive  Severity of Illness: The appropriate patient status for this patient is INPATIENT. Inpatient status is judged to be reasonable and necessary in order to provide the required intensity of service to ensure the patient's safety. The patient's presenting symptoms, physical exam findings, and initial radiographic and laboratory data in the context of  their chronic comorbidities is felt to place them at high risk for  further clinical deterioration. Furthermore, it is not anticipated that the patient will be medically stable for discharge from the hospital within 2 midnights of admission. The following factors support the patient status of inpatient.   " The patient's presenting symptoms include shortness of breath and cough. " The worrisome physical exam findings include coarse breath sound bilaterally with crackles. " The initial radiographic and laboratory data are worrisome because of multilobar pneumonia and CT. " The chronic co-morbidities include Nash cirrhosis.   * I certify that at the point of admission it is my clinical judgment that the patient will require inpatient hospital care spanning beyond 2 midnights from the point of admission due to high intensity of service, high risk for further deterioration and high frequency of surveillance required.Barbette Merino MD [facility] Pager [phone]  If 7PM-7AM, please contact night-coverage [email or web address] Password [facility]  12/09/2020, 11:11 PM

## 2020-12-09 NOTE — ED Notes (Signed)
Critical Lactic @ 7.4 - reported to M.D. Darl Householder

## 2020-12-09 NOTE — ED Triage Notes (Signed)
Pt bibems from home c/o SOB. Pt has hx of asthma. Pt was hot and sweaty per family. They gave her two albuteral neb tx with no relief. Pt clammy with ems and SOB. Pt was 90% on RA and 94% on 4 L [place].

## 2020-12-09 NOTE — ED Notes (Signed)
Critical potassium of 2.3 told to Darl Householder MD

## 2020-12-09 NOTE — ED Provider Notes (Signed)
Rachael Buck   CSN: [id number] Arrival date & time: 12/09/20  1629     History Chief Complaint  Patient presents with  . Shortness of Breath    Rachael Buck is a 80 y.o. female hx of GERD, DM, NASH, asthma, here with shortness of breath and cough.  Patient states that she has been coughing for the last several days.  She has worsening shortness of breath.  She appears to be hot and sweaty this afternoon so EMS was called.  She was given albuterol with no relief.  She has a history of asthma and was noted to be borderline hypoxic with oxygen level of 90% on room air.  Patient was put on 4 L nasal cannula.  Patient is not on oxygen at baseline.  Denies any fevers or chills.  Denies any Covid exposure.  Patient is fully vaccinated against COVID but did not receive her booster shot.  Patient was noted to be tachycardic and hypotensive on arrival.   The history is provided by the patient.       Past Medical History:  Diagnosis Date  . Anxiety   . Arthritis   . Asthma   . Cirrhosis, nonalcoholic ([place])   . Colon polyps   . Diabetes mellitus   . Diverticulitis   . GERD (gastroesophageal reflux disease)   . Hyperlipidemia   . Hypertension   . NASH (nonalcoholic steatohepatitis)   . Obesity   . Pneumonia     Patient Active Problem List   Diagnosis Date Noted  . Severe sepsis ([place]) 12/09/2020  . Cirrhosis of liver ([place]) 02/17/2018  . 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 Thrombocytopenia ([employer]) 02/15/2017  . Varicose veins of left lower extremity with complications 74/94/4967  . Age-related cognitive decline 06/02/2015  . Fatigue 07/19/2014  . Orthostatic hypotension 05/14/2013  . Bronchitis with asthma, acute 01/29/2013  . Nail abnormality 09/04/2012  . Bleeding disorder ([place]) 05/08/2012  . Varices, esophageal ([place]) 03/21/2012  . NASH (nonalcoholic steatohepatitis) 03/21/2012  . HTN (hypertension) 12/21/2011   . Depression 03/28/2011  . IBS (irritable bowel syndrome) 03/23/2011  . INTRINSIC ASTHMA, UNSPECIFIED 08/27/2010  . DYSPNEA ON EXERTION 07/27/2010  . Uncontrolled diabetes mellitus ([place]) 07/24/2010  . MIXED HYPERLIPIDEMIA 07/24/2010  . OBESITY, UNSPECIFIED 07/24/2010  . ALLERGIC RHINITIS CAUSE UNSPECIFIED 07/24/2010  . REFLUX ESOPHAGITIS 07/24/2010  . PAIN IN JOINT PELVIC REGION AND THIGH 07/24/2010    Past Surgical History:  Procedure Laterality Date  . CHOLECYSTECTOMY  1981  . ENDOVENOUS ABLATION SAPHENOUS VEIN W/ LASER Left 02/21/2018   endovenous laser ablation left greater saphenous vein by Tinnie Gens MD   . [place]  . PARTIAL HYSTERECTOMY  1972  . ROTATOR CUFF REPAIR     bilateral. 2006, 2008  . 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 TOTAL KNEE ARTHROPLASTY Left 02/18/2017   Procedure: LEFT TOTAL KNEE ARTHROPLASTY;  Surgeon: Netta Cedars, MD;  Location: [facility];  Service: Orthopedics;  Laterality: Left;  Marland Kitchen VEIN SURGERY       OB History   No obstetric history on file.     Family History  Problem Relation Age of Onset  . Breast cancer Daughter   . Breast cancer Maternal Aunt   . Diabetes Brother   . Diabetes Sister   . Stomach cancer Mother   . Cancer Other   . GER disease Other   . Obesity Other     Social History  Tobacco Use  . Smoking status: Former Research scientist (life sciences)  . Smokeless tobacco: Never Used  Vaping Use  . Vaping Use: Never used  Substance Use Topics  . Alcohol use: No  . Drug use: No    Home Medications Prior to Admission medications   Medication Sig Start Date End Date Taking? Authorizing Provider  albuterol (PROAIR HFA) 108 (90 Base) MCG/ACT inhaler Inhale 2 puffs into the lungs every 4 (four) hours as needed for wheezing or shortness of breath. 09/02/16   Midge Minium, MD  ALPRAZolam Duanne Moron) 0.5 MG tablet TAKE 1 TABLET(0.5 MG) BY MOUTH THREE TIMES DAILY 11/14/20   Copland, Gay Filler, MD  blood glucose meter kit and supplies  KIT Dispense based on patient and insurance preference. Use up to four times daily as directed. 10/29/20   Copland, Gay Filler, MD  colestipol (COLESTID) 1 g tablet Take 1 g by mouth daily.    [provider]  dicyclomine (BENTYL) 20 MG tablet Take 1 tablet (20 mg total) by mouth 3 (three) times daily as needed. 03/04/20   Copland, Gay Filler, MD  diphenhydrAMINE (BENADRYL) 25 MG tablet Take 25 mg by mouth every 6 (six) hours as needed (for allergies.).    [provider]  empagliflozin (JARDIANCE) 10 MG TABS tablet Take 10 mg by mouth daily. 11/26/19   Copland, Gay Filler, MD  glipiZIDE (GLUCOTROL XL) 5 MG 24 hr tablet Take 1 tablet (5 mg total) by mouth daily with breakfast. 12/28/19   Copland, Gay Filler, MD  hydrochlorothiazide (HYDRODIURIL) 25 MG tablet TAKE 1 TABLET(25 MG) BY MOUTH DAILY 04/28/20   Copland, Gay Filler, MD  hydrocortisone 2.5 % cream Apply topically as needed.    [provider]  loperamide (IMODIUM) 2 MG capsule Take 4-6 mg by mouth daily as needed (for IBS symptoms).    [provider]  omeprazole (PRILOSEC) 40 MG capsule TAKE 1 CAPSULE(40 MG) BY MOUTH DAILY 04/28/20   Copland, Gay Filler, MD  [facility] DELICA LANCETS FINE MISC 1 each by Other route daily. 05/18/18   Copland, Gay Filler, MD  ONETOUCH ULTRA test strip TEST BLOOD SUGAR(S) ONCE DAILY IN THE MORNING 10/09/20   Copland, Gay Filler, MD  potassium chloride SA (KLOR-CON M20) 20 MEQ tablet Take 1 tablet (20 mEq total) by mouth daily. 01/31/20   Copland, Gay Filler, MD  sertraline (ZOLOFT) 100 MG tablet Take 1 tablet (100 mg total) by mouth daily. 07/15/20   Copland, Gay Filler, MD  simvastatin (ZOCOR) 80 MG tablet TAKE 1 TABLET(80 MG) BY MOUTH DAILY 07/29/20   Copland, Gay Filler, MD  traMADol (ULTRAM) 50 MG tablet Take 1 tablet (50 mg total) by mouth every 6 (six) hours as needed. 11/20/20   [name], MD  UNABLE TO FIND Med Name: Provertic as needed    [provider]    Allergies     Codeine, Metformin and related, and Shrimp [shellfish allergy]  Review of Systems   Review of Systems  Respiratory: Positive for shortness of breath.   All other systems reviewed and are negative.   Physical Exam Updated Vital Signs BP (!) 134/52   Pulse (!) 115   Temp 99.7 F (37.6 C) (Rectal)   Resp (!) 25   SpO2 93%   Physical Exam Vitals and nursing Buck reviewed.  Constitutional:      Comments: Tachypneic   HENT:     Head: Normocephalic.     Mouth/Throat:     Pharynx: Oropharynx is clear.  Eyes:     Extraocular Movements: Extraocular movements intact.     Pupils: Pupils are equal, round, and reactive to light.  Cardiovascular:     Rate and Rhythm: Regular rhythm. Tachycardia present.  Pulmonary:     Comments: Tachypneic and crackles on the right base.  Minimal wheezing throughout as well.  Mild retractions Abdominal:     General: Bowel sounds are normal.     Palpations: Abdomen is soft.  Musculoskeletal:        General: Normal range of motion.     Cervical back: Normal range of motion and neck supple.  Skin:    General: Skin is warm.     Capillary Refill: Capillary refill takes less than 2 seconds.  Neurological:     General: No focal deficit present.     Mental Status: She is alert and oriented to person, place, and time.  Psychiatric:        Mood and Affect: Mood normal.        Behavior: Behavior normal.     ED Results / Procedures / Treatments   Labs (all labs ordered are listed, but only abnormal results are displayed) Labs Reviewed  CBC WITH DIFFERENTIAL/PLATELET - Abnormal; Notable for the following components:      Result Value   WBC 28.9 (*)    RDW 17.6 (*)    Platelets 82 (*)    Neutro Abs 25.2 (*)    Monocytes Absolute 2.0 (*)    Abs Immature Granulocytes 0.52 (*)    All other components within normal limits  COMPREHENSIVE METABOLIC PANEL - Abnormal; Notable for the following components:   Potassium 2.3 (*)    CO2 21 (*)    Glucose,  Bld 129 (*)    Creatinine, Ser 1.25 (*)    Calcium 8.4 (*)    Total Protein 6.2 (*)    Albumin 3.2 (*)    Total Bilirubin 1.8 (*)    GFR, Estimated 44 (*)    Anion gap 16 (*)    All other components within normal limits  LACTIC ACID, PLASMA - Abnormal; Notable for the following components:   Lactic Acid, Venous 7.4 (*)    All other components within normal limits  MAGNESIUM - Abnormal; Notable for the following components:   Magnesium 1.2 (*)    All other components within normal limits  I-STAT CHEM 8, ED - Abnormal; Notable for the following components:   Potassium 2.4 (*)    Glucose, Bld 124 (*)    Calcium, Ion 1.04 (*)    All other components within normal limits  I-STAT VENOUS BLOOD GAS, ED - Abnormal; Notable for the following components:   pH, Ven 7.447 (*)    pCO2, Ven 31.5 (*)    pO2, Ven 56.0 (*)    Potassium 2.2 (*)    Calcium, Ion 0.98 (*)    All other components within normal limits  TROPONIN I (HIGH SENSITIVITY) - Abnormal; Notable for the following components:   Troponin I (High Sensitivity) 43 (*)    All other components within normal limits  RESP PANEL BY RT-PCR (FLU A&B, COVID) ARPGX2  CULTURE, BLOOD (ROUTINE X 2)  CULTURE, BLOOD (ROUTINE X 2)  BLOOD GAS, VENOUS  LACTIC ACID, PLASMA  TROPONIN I (HIGH SENSITIVITY)    EKG None  Radiology CT Angio Chest PE W and/or Wo Contrast  Result Date: 12/09/2020 CLINICAL DATA:  Shortness of breath. EXAM: CT ANGIOGRAPHY CHEST WITH CONTRAST TECHNIQUE: Multidetector CT imaging of the  chest was performed using the standard protocol during bolus administration of intravenous contrast. Multiplanar CT image reconstructions and MIPs were obtained to evaluate the vascular anatomy. CONTRAST:  42m OMNIPAQUE IOHEXOL 350 MG/ML SOLN COMPARISON:  None. FINDINGS: Cardiovascular: There is marked severity calcification of the aortic arch and descending thoracic aorta, without evidence of aneurysmal dilatation. Satisfactory opacification  of the pulmonary arteries to the segmental level. No evidence of pulmonary embolism. Normal heart size with moderate severity coronary artery calcification. No pericardial effusion. Mediastinum/Nodes: No enlarged mediastinal, hilar, or axillary lymph nodes. Thyroid gland, trachea, and esophagus demonstrate no significant findings. Lungs/Pleura: Marked severity infiltrate is seen throughout the right middle lobe and posterior aspect of the right upper lobe. Mild, posterior right basilar atelectasis and/or infiltrate is seen. A 1.9 cm x 1.3 cm noncalcified, nonenhancing soft tissue nodule is seen along the infrahilar aspect of the right lower lobe (axial CT images 57 through 62, CT series number 5). There is a small right pleural effusion. No pneumothorax is identified. Upper Abdomen: The liver is cirrhotic in appearance. Surgical clips are noted within the gallbladder fossa. Musculoskeletal: Degenerative changes are seen throughout the thoracic spine. Review of the MIP images confirms the above findings. IMPRESSION: 1. Marked severity right middle lobe and right upper lobe infiltrate. 2. Mild right basilar atelectasis and/or infiltrate. 3. Small right pleural effusion. 4. 1.9 cm x 1.3 cm right infrahilar soft tissue nodule. Consider one of the following in 3 months for both low-risk and high-risk individuals: (a) repeat chest CT, (b) follow-up PET-CT, or (c) tissue sampling. This recommendation follows the consensus statement: Guidelines for Management of Incidental Pulmonary Nodules Detected on CT Images: From the Fleischner Society 2017; Radiology 2017; 284:228-243. 5. Cirrhotic liver. 6. Aortic atherosclerosis. Aortic Atherosclerosis (ICD10-I70.0). Electronically Signed   By: TVirgina NorfolkM.D.   On: 12/09/2020 19:32   DG Chest Port 1 View  Result Date: 12/09/2020 CLINICAL DATA:  Short of breath for 3 days, history of asthma and diabetes EXAM: PORTABLE CHEST 1 VIEW COMPARISON:  12/07/2019 FINDINGS: 2  frontal views of the chest demonstrate an unremarkable cardiac silhouette. There is diffuse right-sided airspace disease greatest in the mid and lower lung zones, likely involving the right middle and right lower lobes. No large effusion or pneumothorax. No acute bony abnormalities. IMPRESSION: 1. Significant right-sided airspace disease consistent with right middle and right lower lobe pneumonia. Followup PA and lateral chest X-ray is recommended in 3-4 weeks following trial of antibiotic therapy to ensure resolution and exclude underlying malignancy. Electronically Signed   By: MRanda NgoM.D.   On: 12/09/2020 17:06    Procedures Procedures   CRITICAL CARE Performed by: DWandra Arthurs  Total critical care time: 30  minutes  Critical care time was exclusive of separately billable procedures and treating other patients.  Critical care was necessary to treat or prevent imminent or life-threatening deterioration.  Critical care was time spent personally by me on the following activities: development of treatment plan with patient and/or surrogate as well as nursing, discussions with consultants, evaluation of patient's response to treatment, examination of patient, obtaining history from patient or surrogate, ordering and performing treatments and interventions, ordering and review of laboratory studies, ordering and review of radiographic studies, pulse oximetry and re-evaluation of patient's condition.   Medications Ordered in ED Medications  potassium chloride 10 mEq in 100 mL IVPB (10 mEq Intravenous New Bag/Given 12/09/20 1938)  sodium chloride 0.9 % bolus 1,000 mL (0 mLs Intravenous Stopped 12/09/20  1933)  albuterol (VENTOLIN HFA) 108 (90 Base) MCG/ACT inhaler 2 puff (2 puffs Inhalation Given 12/09/20 1705)  methylPREDNISolone sodium succinate (SOLU-MEDROL) 125 mg/2 mL injection 125 mg (125 mg Intravenous Given 12/09/20 1706)  magnesium sulfate IVPB 2 g 50 mL (0 g Intravenous Stopped  12/09/20 1808)  sodium chloride 0.9 % bolus 1,000 mL (0 mLs Intravenous Stopped 12/09/20 1933)  cefTRIAXone (ROCEPHIN) 1 g in sodium chloride 0.9 % 100 mL IVPB (0 g Intravenous Stopped 12/09/20 1821)  azithromycin (ZITHROMAX) 500 mg in sodium chloride 0.9 % 250 mL IVPB (0 mg Intravenous Stopped 12/09/20 1921)  iohexol (OMNIPAQUE) 350 MG/ML injection 50 mL (50 mLs Intravenous Contrast Given 12/09/20 1911)    ED Course  I have reviewed the triage vital signs and the nursing notes.  Pertinent labs & imaging results that were available during my care of the patient were reviewed by me and considered in my medical decision making (see chart for details).    MDM Rules/Calculators/A&P                         Rachael Buck is a 80 y.o. female here presenting with shortness of breath.  Shortness of breath and diaphoresis today.  Patient is hypotensive and tachycardic.  Consider pneumonia versus asthma exacerbation versus Covid versus PE .  Plan to get CBC, CMP, Covid test, CTA chest.  Will hydrate and reassess.  If she has pneumonia she will likely need lactate and cultures.  8:10 PM Labs showed potassium of 2.4.  White blood cell count is 28 and lactate is 7.  Patient was given IVF and blood pressure up to 130 from 70s now.  Patient CT showed pneumonia.  Patient was given antibiotics for pneumonia.  Will admit for sepsis from pneumonia.    Final Clinical Impression(s) / ED Diagnoses Final diagnoses:  None    Rx / DC Orders ED Discharge Orders    None       Drenda Freeze, MD 12/09/20 2031

## 2020-12-10 ENCOUNTER — Inpatient Hospital Stay (HOSPITAL_COMMUNITY): Payer: PPO

## 2020-12-10 DIAGNOSIS — A419 Sepsis, unspecified organism: Principal | ICD-10-CM

## 2020-12-10 DIAGNOSIS — J181 Lobar pneumonia, unspecified organism: Secondary | ICD-10-CM | POA: Diagnosis not present

## 2020-12-10 DIAGNOSIS — R6520 Severe sepsis without septic shock: Secondary | ICD-10-CM

## 2020-12-10 LAB — GLUCOSE, CAPILLARY
Glucose-Capillary: 169 mg/dL — ABNORMAL HIGH (ref 70–99)
Glucose-Capillary: 180 mg/dL — ABNORMAL HIGH (ref 70–99)
Glucose-Capillary: 158 mg/dL — ABNORMAL HIGH (ref 70–99)
Glucose-Capillary: 141 mg/dL — ABNORMAL HIGH (ref 70–99)
Glucose-Capillary: 171 mg/dL — ABNORMAL HIGH (ref 70–99)

## 2020-12-10 LAB — COMPREHENSIVE METABOLIC PANEL
ALT: 18 U/L (ref 0–44)
AST: 30 U/L (ref 15–41)
Albumin: 3.3 g/dL — ABNORMAL LOW (ref 3.5–5.0)
Alkaline Phosphatase: 43 U/L (ref 38–126)
Anion gap: 11 (ref 5–15)
BUN: 17 mg/dL (ref 8–23)
CO2: 25 mmol/L (ref 22–32)
Calcium: 8.3 mg/dL — ABNORMAL LOW (ref 8.9–10.3)
Chloride: 100 mmol/L (ref 98–111)
Creatinine, Ser: 0.94 mg/dL (ref 0.44–1.00)
GFR, Estimated: >60 mL/min (ref >60)
Glucose, Bld: 168 mg/dL — ABNORMAL HIGH (ref 70–99)
Potassium: 3.2 mmol/L — ABNORMAL LOW (ref 3.5–5.1)
Sodium: 136 mmol/L (ref 135–145)
Total Bilirubin: 1.8 mg/dL — ABNORMAL HIGH (ref 0.3–1.2)
Total Protein: 6.6 g/dL (ref 6.5–8.1)

## 2020-12-10 LAB — CBC
HCT: 33.9 % — ABNORMAL LOW (ref 36.0–46.0)
Hemoglobin: 11.5 g/dL — ABNORMAL LOW (ref 12.0–15.0)
MCH: 28.3 pg (ref 26.0–34.0)
MCHC: 33.9 g/dL (ref 30.0–36.0)
MCV: 83.5 fL (ref 80.0–100.0)
Platelets: 68 10*3/uL — ABNORMAL LOW (ref 150–400)
RBC: 4.06 MIL/uL (ref 3.87–5.11)
RDW: 17.8 % — ABNORMAL HIGH (ref 11.5–15.5)
WBC: 35 10*3/uL — ABNORMAL HIGH (ref 4.0–10.5)
nRBC: 0 % (ref 0.0–0.2)

## 2020-12-10 LAB — PROCALCITONIN: Procalcitonin: 43.89 ng/mL

## 2020-12-10 LAB — CORTISOL-AM, BLOOD: Cortisol - AM: 37.3 ug/dL — ABNORMAL HIGH (ref 6.7–22.6)

## 2020-12-10 LAB — LACTIC ACID, PLASMA
Lactic Acid, Venous: 3.7 mmol/L (ref 0.5–1.9)
Lactic Acid, Venous: 2.6 mmol/L (ref 0.5–1.9)

## 2020-12-10 LAB — PROTIME-INR
INR: 1.6 — ABNORMAL HIGH (ref 0.8–1.2)
Prothrombin Time: 19.3 seconds — ABNORMAL HIGH (ref 11.4–15.2)

## 2020-12-10 LAB — MAGNESIUM: Magnesium: 1.8 mg/dL (ref 1.7–2.4)

## 2020-12-10 LAB — MRSA PCR SCREENING: MRSA by PCR: NEGATIVE

## 2020-12-10 LAB — STREP PNEUMONIAE URINARY ANTIGEN: Strep Pneumo Urinary Antigen: NEGATIVE

## 2020-12-10 MED ORDER — ALBUTEROL SULFATE (2.5 MG/3ML) 0.083% IN NEBU
2.5000 mg | INHALATION_SOLUTION | Freq: Four times a day (QID) | RESPIRATORY_TRACT | Status: DC
  Filled 2020-12-10: qty 3

## 2020-12-10 MED ORDER — VANCOMYCIN HCL 1750 MG/350ML IV SOLN
1750.0000 mg | Freq: Once | INTRAVENOUS | Status: AC
  Administered 2020-12-10: 1750 mg via INTRAVENOUS
  Filled 2020-12-10: qty 350

## 2020-12-10 MED ORDER — VANCOMYCIN HCL 1500 MG/300ML IV SOLN
1500.0000 mg | INTRAVENOUS | Status: DC
  Filled 2020-12-10: qty 300

## 2020-12-10 MED ORDER — PIPERACILLIN-TAZOBACTAM 3.375 G IVPB
3.3750 g | Freq: Three times a day (TID) | INTRAVENOUS | Status: DC
  Administered 2020-12-10 – 2020-12-15 (×14): 3.375 g via INTRAVENOUS
  Filled 2020-12-10 (×16): qty 50

## 2020-12-10 MED ORDER — MAGNESIUM OXIDE 400 (241.3 MG) MG PO TABS
400.0000 mg | ORAL_TABLET | Freq: Two times a day (BID) | ORAL | Status: AC
  Administered 2020-12-10 – 2020-12-12 (×6): 400 mg via ORAL
  Filled 2020-12-10 (×6): qty 1

## 2020-12-10 MED ORDER — IPRATROPIUM-ALBUTEROL 0.5-2.5 (3) MG/3ML IN SOLN
3.0000 mL | RESPIRATORY_TRACT | Status: DC
  Administered 2020-12-10 (×2): 3 mL via RESPIRATORY_TRACT
  Filled 2020-12-10 (×2): qty 3

## 2020-12-10 MED ORDER — SODIUM CHLORIDE 0.9 % IV SOLN
500.0000 mg | INTRAVENOUS | Status: AC
Start: 2020-12-10 — End: 2020-12-13
  Administered 2020-12-10 – 2020-12-13 (×4): 500 mg via INTRAVENOUS
  Filled 2020-12-10 (×4): qty 500

## 2020-12-10 MED ORDER — PIPERACILLIN-TAZOBACTAM 3.375 G IVPB 30 MIN
3.3750 g | Freq: Once | INTRAVENOUS | Status: AC
  Administered 2020-12-10: 3.375 g via INTRAVENOUS
  Filled 2020-12-10: qty 50

## 2020-12-10 MED ORDER — ORAL CARE MOUTH RINSE
15.0000 mL | Freq: Two times a day (BID) | OROMUCOSAL | Status: DC
  Administered 2020-12-10 – 2020-12-15 (×11): 15 mL via OROMUCOSAL

## 2020-12-10 MED ORDER — VANCOMYCIN HCL 10 G IV SOLR
1750.0000 mg | Freq: Once | INTRAVENOUS | Status: DC
  Filled 2020-12-10: qty 1750

## 2020-12-10 MED ORDER — POTASSIUM CHLORIDE 20 MEQ PO PACK
40.0000 meq | PACK | Freq: Once | ORAL | Status: AC
  Administered 2020-12-10: 40 meq via ORAL
  Filled 2020-12-10: qty 2

## 2020-12-10 MED ORDER — IPRATROPIUM-ALBUTEROL 0.5-2.5 (3) MG/3ML IN SOLN
3.0000 mL | Freq: Four times a day (QID) | RESPIRATORY_TRACT | Status: DC
  Administered 2020-12-11 – 2020-12-12 (×8): 3 mL via RESPIRATORY_TRACT
  Filled 2020-12-10 (×8): qty 3

## 2020-12-10 MED ORDER — BUDESONIDE 0.5 MG/2ML IN SUSP
0.5000 mg | Freq: Two times a day (BID) | RESPIRATORY_TRACT | Status: DC
  Administered 2020-12-10 – 2020-12-15 (×10): 0.5 mg via RESPIRATORY_TRACT
  Filled 2020-12-10 (×10): qty 2

## 2020-12-10 NOTE — Progress Notes (Addendum)
Pharmacy Antibiotic Note  Rachael Buck is a 80 y.o. female admitted on 12/09/2020 with pneumonia on azithromycin and ceftriaxone and worsening CXR.   Pharmacy has been consulted for zosyn and vancomycin (also continuing azithromycin).  -WBC= 35, tmax= 99.7, SCr= 0.9 -blood cultures pending  Plan: -Vancomycin 1750 mg IV x1 followed by 1561m IV q24h (estimated AUC= 468) -Zosyn 3.375gm IV q8h -Will follow renal function, cultures and clinical progress    Height: 5' 6"  (167.6 cm) Weight: 87.8 kg (193 lb 8 oz) IBW/kg (Calculated) : 59.3  Temp (24hrs), Avg:98.6 F (37 C), Min:98 F (36.7 C), Max:99.7 F (37.6 C)  Recent Labs  Lab 12/09/20 1638 12/09/20 1714 12/09/20 1816 12/09/20 1914 12/10/20 0241  WBC 28.9*  --   --   --  35.0*  CREATININE 1.25*  --  1.00  --  0.94  LATICACIDVEN  --  7.4*  --  5.7*  --     Estimated Creatinine Clearance: 55.1 mL/min (by C-G formula based on SCr of 0.94 mg/dL).    Allergies  Allergen Reactions  . Codeine Nausea And Vomiting  . Metformin And Related Diarrhea  . Shrimp [Shellfish Allergy] Swelling    Thank you for allowing pharmacy to be a part of this patient's care.  AHildred Laser PharmD Clinical Pharmacist **Pharmacist phone directory can now be found on [facility] (PW [email or web address]).  Listed under [facility]

## 2020-12-10 NOTE — Progress Notes (Signed)
Pt.has not voided since admission.Bladder scan done & obtained 600 cc.MD on call was called & ordered to do in & out cath.& done & obtained 1000 cc urine output.

## 2020-12-10 NOTE — Progress Notes (Signed)
Patient called out stating she was SOB.  Sats 95% or higher.  O2 increased to 3 L [place].  Spoke to Marni Griffon, NP who stated he would come reassess patient.  In the meanwhile, spoke with Kelli Churn, RN ([facility]) for surveillance.  Patient repositioned with the assistance of Kelli Churn, RN and prn Xanax given.

## 2020-12-10 NOTE — Consult Note (Signed)
NAME:  Rachael Buck, MRN:  [id number], DOB:  1940/10/13, LOS: 1 ADMISSION DATE:  12/09/2020, CONSULTATION DATE:  4/13 REFERRING MD:  Lupita Leash, CHIEF COMPLAINT:  Abnormal CXR (worsening)   History of Present Illness:  This is a 80 year old female w/ hx as outlined below. Presented to the ER on 4/12 w/ cc: several day h/o sore throat, sinus pressure, post nasal gtt then 1 d h/o marked fatigue and malaise, increasing shortness of breath & cough. Dyspnea progressed to resting SOB. Did not report fever but was diaphoretic.  On arrival low gd temp 99.7, room air sats 90%, LA 5.7 w/ inc'd RR. WBC ct 28.9. CT imaging to further eval CXR showed fairly sig RUL and RML airspace disease w/ marked area of consolidations, small right effusion and 1.9 x 1.3 cm soft tissue nodule right infrahilar best noted on series 5 images 57-62. She was started on supplemental oxygen and antibiotics. On 4/13 feeling better but CXR worse so Pulm asked to see.  Pertinent  Medical History  NASH w/ Cirrhosis, Diabetes, HTN, GERD, HL, Morbid Obesity, Asthma, anxiety disorder.   Significant Hospital Events: Including procedures, antibiotic start and stop dates in addition to other pertinent events   . 4/12 admitted w/ working dx of CAP. CT chest RUL and RML airspace disease w/ marked area of consolidations, small right effusion and 1.9 x 1.3 cm soft tissue nodule right infrahilar best noted on series 5 images 57-62. Azithro and Ceftriaxone started. Resp viral panel was negative.  . 4/13 procalcitonin 43.9, wbc ct 28.9 to 35  (but had received 179m steroids 4/12). Fever curve not much changed. CXR w/ worsening consolidation and overall worsening of right sided airspace disease. Because of this PCCM consulted. ABX changed to Vancomycin, zosyn and azithromycin.   Interim History / Subjective:  Feels better  Objective   Blood pressure (Abnormal) 108/48, pulse 99, temperature 98.9 F (37.2 C), temperature source Oral, resp. rate 18,  height 5' 6"  (1.676 m), weight 87.8 kg, SpO2 98 %.        Intake/Output Summary (Last 24 hours) at 12/10/2020 1152 Last data filed at 12/10/2020 0230 Gross per 24 hour  Intake no documentation  Output 1000 ml  Net -1000 ml   Filed Weights   12/09/20 2118 12/10/20 0249 12/10/20 0342  Weight: 89.4 kg 89.4 kg 87.8 kg    Examination: General: this is a pleasant 80year old WF. She is sitting reclined in the bed. No distress. Does however speak on about 3 word phrases.  HENT: NCAT no JVD, MMM sclera not icteric. No LAD  Lungs: decreased on right base. Some translocated bronchial sounds w/ wheezing on the right. Left chest clear  Card tachy rrr no MRG does have occ PVC noted on tele  abd soft chronically tender over RUQ. + bowel sounds Ext warm and dry no edema brisk CR Neuro awake, oriented. No focal def  GU voids Labs/imaging that I havepersonally reviewed  (right click and "Reselect all SmartList Selections" daily)    Resolved Hospital Problem list     Assessment & Plan:   Dyspnea  CAP involving mostly RM and RLL Severe Sepsis 2/2 CAP  Lactic acidosis  H/o allergic rhinitis and seasonal allergies Right perihilar lung nodule H/o reactive airway disease/asthma (no PFTs dx'd in 363s Only an issue typically during allergy season) Leukocytosis  H/o cirrhosis in context of NASH H/o diabetes H/o HTN H/o Obesity  H/o anxiety  DNR status   Pulmonary problem  list   Dyspnea in setting of CAP (NOS to date) Prior smoker  -pcxr personally reviewed shows some increased consolidation involving the RUL/RML dec'd aeration c/w film day before. Left clear -O2 requirements down  -clinically feels better -Overall It's difficult to determine at this point for certain;  but typically radiographic changes/improvment lag behind clinical changes/improvement & would interpret the fact that she feels better as reassuring. I suspect the WBC rise is related to steroids.  Plan Cont current abx.  The primary team has widened this to vanc and zosyn today and she has completed 1 day of azithromycin  Will add scheduled BDs and also ICS (given h/o reactive airway disease and wheezing) Will send U strep and legionella antigen  F/u MRSA swab Cycle PCTs Wean oxygen Cont pulse ox.  Would not bronch (yield after abx low) Currently no clear sig effusion on plain film. Will repeat CXR in am   Sepsis 2/2 CAP w/ lactic acidosis  Still meets SIRS criteria. BP stable lactate slowly trending down Plan Would hold off on further diuresis for now  abx as above  Lung Nodule: best noted on series 5 images 57-62 Plan F/u CT imaging 3 months   Best practice (right click and "Reselect all SmartList Selections" daily)  Per primary   Review of Systems:   Review of Systems  Constitutional: Positive for chills, diaphoresis, fever and malaise/fatigue.  HENT: Positive for congestion, sinus pain and sore throat.   Eyes: Negative.   Respiratory: Positive for cough, sputum production and shortness of breath.   Cardiovascular: Negative.   Gastrointestinal: Negative.   Genitourinary: Negative.   Musculoskeletal: Positive for myalgias.  Skin: Negative.   Neurological: Positive for weakness.  Endo/Heme/Allergies: Negative.   Psychiatric/Behavioral: Negative.      Past Medical History:  She,  has a past medical history of Anxiety, Arthritis, Asthma, Cirrhosis, nonalcoholic ([place]), Colon polyps, Diabetes mellitus, Diverticulitis, GERD (gastroesophageal reflux disease), Hyperlipidemia, Hypertension, NASH (nonalcoholic steatohepatitis), Obesity, and Pneumonia.   Surgical History:   Past Surgical History:  Procedure Laterality Date  . CHOLECYSTECTOMY  1981  . ENDOVENOUS ABLATION SAPHENOUS VEIN W/ LASER Left 02/21/2018   endovenous laser ablation left greater saphenous vein by Tinnie Gens MD   . [place]  . PARTIAL HYSTERECTOMY  1972  . ROTATOR CUFF REPAIR     bilateral. 2006, 2008  .  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 She has never used smokeless tobacco. She reports that she does not drink alcohol and does not use drugs.   Family History:  Her family history includes Breast cancer in her daughter and maternal aunt; Cancer in an other family member; Diabetes in her brother and sister; GER disease in an other family member; Obesity in an other family member; Stomach cancer in her mother.   Allergies Allergies  Allergen Reactions  . Codeine Nausea And Vomiting  . Metformin And Related Diarrhea  . Shrimp [Shellfish Allergy] Swelling     Home Medications  Prior to Admission medications   Medication Sig Start Date End Date Taking? Authorizing Provider  acetaminophen (TYLENOL) 500 MG tablet Take 500 mg by mouth every 6 (six) hours as needed for mild pain.   Yes [provider]  albuterol (PROAIR HFA) 108 (90 Base) MCG/ACT inhaler Inhale 2 puffs into the lungs every 4 (four) hours as needed for wheezing or shortness of breath. 09/02/16  Yes Midge Minium, MD  ALPRAZolam Duanne Moron) 0.5 MG tablet TAKE 1 TABLET(0.5 MG) BY MOUTH THREE TIMES DAILY Patient taking differently: Take 0.5 mg by mouth 3 (three) times daily. 11/14/20  Yes Copland, Gay Filler, MD  colestipol (COLESTID) 1 g tablet Take 1 g by mouth daily.   Yes [provider]  dicyclomine (BENTYL) 20 MG tablet Take 1 tablet (20 mg total) by mouth 3 (three) times daily as needed. Patient taking differently: Take 20 mg by mouth 3 (three) times daily as needed for spasms. 03/04/20  Yes Copland, Gay Filler, MD  diphenhydrAMINE (BENADRYL) 25 MG tablet Take 25 mg by mouth every 6 (six) hours as needed (for allergies.).   Yes [provider]   empagliflozin (JARDIANCE) 10 MG TABS tablet Take 10 mg by mouth daily. 11/26/19  Yes Copland, Gay Filler, MD  glipiZIDE (GLUCOTROL XL) 5 MG 24 hr tablet Take 1 tablet (5 mg total) by mouth daily with breakfast. 12/28/19  Yes Copland, Gay Filler, MD  hydrochlorothiazide (HYDRODIURIL) 25 MG tablet TAKE 1 TABLET(25 MG) BY MOUTH DAILY Patient taking differently: Take 25 mg by mouth daily. 04/28/20  Yes Copland, Gay Filler, MD  hydrocortisone 2.5 % cream Apply 1 application topically daily as needed (itchy dry skin).   Yes [provider]  loperamide (IMODIUM) 2 MG capsule Take 4-6 mg by mouth daily as needed (for IBS symptoms).   Yes [provider]  Melatonin 10 MG TABS Take 10 mg by mouth at bedtime.   Yes [provider]  omeprazole (PRILOSEC) 40 MG capsule TAKE 1 CAPSULE(40 MG) BY MOUTH DAILY Patient taking differently: Take 40 mg by mouth daily. 04/28/20  Yes Copland, Gay Filler, MD  sertraline (ZOLOFT) 100 MG tablet Take 1 tablet (100 mg total) by mouth daily. 07/15/20  Yes Copland, Gay Filler, MD  simvastatin (ZOCOR) 80 MG tablet TAKE 1 TABLET(80 MG) BY MOUTH DAILY Patient taking differently: Take 80 mg by mouth daily at 6 PM. 07/29/20  Yes Copland, Gay Filler, MD  traMADol (ULTRAM) 50 MG tablet Take 1 tablet (50 mg total) by mouth every 6 (six) hours as needed. Patient taking differently: Take 50 mg by mouth every 6 (six) hours as needed for moderate pain. 11/20/20  Yes [name], MD  blood glucose meter kit and supplies KIT Dispense based on patient and insurance preference. Use up to four times daily as directed. 10/29/20   Copland, Gay Filler, MD  [facility] DELICA LANCETS FINE MISC 1 each by Other route daily. 05/18/18   Copland, Gay Filler, MD  ONETOUCH ULTRA test strip TEST BLOOD SUGAR(S) ONCE DAILY IN THE MORNING 10/09/20   Copland, Gay Filler, MD  potassium chloride SA (KLOR-CON M20) 20 MEQ tablet Take 1 tablet (20 mEq total) by mouth daily. 01/31/20   Copland, Gay Filler,  MD     Critical care time: NA     Erick Colace ACNP-BC [place] Pager # [phone] OR # [phone] if no answer

## 2020-12-10 NOTE — Progress Notes (Signed)
PROGRESS NOTE    Rachael Buck  XTK:[id number] DOB: Mar 31, 1941 DOA: 12/09/2020 PCP: [name], MD   Chief Complaint  Patient presents with  . Shortness of Breath  Brief Narrative: 80 year old female with history of NASH/cirrhosis, diabetes, HTN, GERD, HLD, morbid obesity, asthma, anxiety disorder presented with progressive shortness of breath, cough for few days. In the ED low-grade temp nine 9.7 tachycardic oxygen 90% room air with significant leukocytosis elevated procalcitonin, hypokalemia and thrombocytopenia. 80 y.o. female with medical history significant of NASH cirrhosis, diabetes, hypertension, GERD, hyperlipidemia, morbid obesity, asthma and anxiety disorder who presented to the ER with progressive shortness of breath and cough for a few days, Lactic acidosis 7.4> 5.7. CT angiogram of the chest showed no PE but right middle lobe and right upper lobe infiltrates.  There is mild right basilar atelectasis on or infiltrate.  Small right pleural effusion.  Patient also has a 1.9 x 1.2 cm right infrahilar soft tissue nodule as well as evidence of cirrhotic liver.  Given IV fluids boluses 2 L, IV Solu-Medrol and was admitted.   Subjective: Seen and examined this morning Feels well this am son and husband at bedside Feels much better compared to yesterday.  On 4 L nasal cannula  And weaned down to 2.5 L saturating 90%.   Received Lasix last night 11 PM  Assessment & Plan:  Severe sepsis POA Multifocal pneumonia  Severe sepsis due to right middle lobe and right upper lobe pneumonia: Patient had tachycardia tachypnea and leukocytosis and lactic acidosis on admission.  Leukocytosis uptrending, on supplemental oxygen weaned down to 2.5 L from 4, blood pressure stable clinically seems to be improving.  Continue on current ceftriaxone azithromycin follow-up culture data.  I will recheck lactic acid.  Monitor WBC count procalcitonin.  Repeat chest x-ray this morning. Has significant  leukocytosis at 30 5K could be in the setting of infection, also contributed by Solu-Medrol high-dose yesterday.  But clinically feeling better however hypoxic still.  Repeat chest x-ray shows progression, wornsened airspace disease.  Check MRSA swab, will change antibiotics to vancomycin/zosyn, cont azithro and will consult pulmonary.  Recent Labs  Lab 12/09/20 1638 12/09/20 1714 12/09/20 1914 12/10/20 0241  WBC 28.9*  --   --  35.0*  LATICACIDVEN  --  7.4* 5.7*  --   PROCALCITON  --   --   --  43.89   Acute hypoxic and hypercapnic respiratory failure needing 4 L nasal cannula weaned down to 2.5 L this morning.  Likely in the setting of severe sepsis pneumonia suspect underlying sleep apnea.  Will need outpatient sleep evaluation, high-pitched over 62.  Yesterday.  Uncontrolled diabetes mellitus, controlled HbA1c 6.7, a month ago.  Blood sugar is stable continue on sliding scale insulin, jardiance and oral glipizide. Recent Labs  Lab 12/09/20 2122 12/10/20 0633 12/10/20 0851  GLUCAP 156* 169* 180*   Hypokalemia much better continue replacement.  Hypomagnesemia-level much better at magnesium oxide orally. Acute renal failure creatinine elevated 1.1 admission improved to 0.9 with IV fluids.  Monitor.  HTN: Blood pressure stable, continue to hold home meds for now  NASH w/ cirrhosis of liver: She reports she follows up with Dr. Marin Olp.  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  Monitor platelet count Recent Labs  Lab 12/09/20 1638 12/10/20 0241  PLT 82* 68*   Incidentally noted abnormal CT chest with  "1.9 cm x 1.3 cm noncalcified, nonenhancing soft tissue nodule is  seen along the infrahilar aspect of the right lower lobe": Per recommendation the family and will follow up with PCP/Dr. Marin Olp  Morbid obesity BMI more than 30 will benefit with weight loss PCP follow-up, as well as sleep apnea evaluation. Diet Order            Diet  heart healthy/carb modified Room service appropriate? Yes; Fluid consistency: Thin  Diet effective now               Patient's Body mass index is 31.23 kg/m.  DVT prophylaxis: enoxaparin (LOVENOX) injection 40 mg Start: 12/09/20 2200 Code Status:   Code Status: DNR  Family Communication: plan of care discussed with patient at bedside. Son and husband at the bedside updated.  Status is: Inpatient Remains inpatient appropriate because:IV treatments appropriate due to intensity of illness or inability to take PO and Inpatient level of care appropriate due to severity of illness  Dispo: The patient is from: Home              Anticipated d/c is to: TBD.  Obtain PT OT evaluation tomorrow.              Patient currently is not medically stable to d/c.   Difficult to place patient No  Unresulted Labs (From admission, onward)          Start     Ordered   12/16/20 0500  Creatinine, serum  (enoxaparin (LOVENOX)    CrCl >/= 30 ml/min)  Weekly,   R     Comments: while on enoxaparin therapy    12/09/20 2113   12/11/20 0500  Procalcitonin  Daily,   R     Question:  Specimen collection method  Answer:  Lab=Lab collect   12/10/20 0907         Medications reviewed:  Scheduled Meds: . atorvastatin  40 mg Oral Daily  . colestipol  1 g Oral Daily  . dicyclomine  20 mg Oral TID AC & HS  . empagliflozin  10 mg Oral Daily  . enoxaparin (LOVENOX) injection  40 mg Subcutaneous Q24H  . glipiZIDE  5 mg Oral Q breakfast  . guaiFENesin  600 mg Oral BID  . hydrocortisone cream   Topical BID  . insulin aspart  0-15 Units Subcutaneous TID WC  . insulin aspart  0-5 Units Subcutaneous QHS  . magnesium oxide  400 mg Oral BID  . mouth rinse  15 mL Mouth Rinse BID  . pantoprazole  40 mg Oral Daily  . sertraline  100 mg Oral Daily   Continuous Infusions: . azithromycin    . cefTRIAXone (ROCEPHIN)  IV      Consultants:see note  Procedures:see note  Antimicrobials: Anti-infectives (From  admission, onward)   Start     Dose/Rate Route Frequency Ordered Stop   12/10/20 1800  cefTRIAXone (ROCEPHIN) 2 g in sodium chloride 0.9 % 100 mL IVPB        2 g 200 mL/hr over 30 Minutes Intravenous Every 24 hours 12/09/20 2113     12/10/20 1800  azithromycin (ZITHROMAX) 500 mg in sodium chloride 0.9 % 250 mL IVPB        500 mg 250 mL/hr over 60 Minutes Intravenous Every 24 hours 12/09/20 2113     12/09/20 1715  cefTRIAXone (ROCEPHIN) 1 g in sodium chloride 0.9 % 100 mL IVPB        1 g 200 mL/hr over 30 Minutes Intravenous  Once 12/09/20 1714 12/09/20 1821  12/09/20 1715  azithromycin (ZITHROMAX) 500 mg in sodium chloride 0.9 % 250 mL IVPB        500 mg 250 mL/hr over 60 Minutes Intravenous  Once 12/09/20 1714 12/09/20 1921     Culture/Microbiology    Component Value Date/Time   SDES BLOOD RIGHT WRIST 12/09/2020 2145   SPECREQUEST  12/09/2020 2145    BOTTLES DRAWN AEROBIC AND ANAEROBIC Blood Culture adequate volume   CULT  12/09/2020 2145    NO GROWTH < 12 HOURS Performed at [facility] [address], [place], [place] [address]    REPTSTATUS PENDING 12/09/2020 2145    Other culture-see note  Objective: Vitals: Today's Vitals   12/10/20 0324 12/10/20 0342 12/10/20 0518 12/10/20 0737  BP: 106/77  (!) 110/52 (!) 108/48  Pulse: (!) 111  96 99  Resp: (!) 23  17 18   Temp: 98.7 F (37.1 C)  98.2 F (36.8 C) 98.9 F (37.2 C)  TempSrc: Oral  Oral Oral  SpO2: 97%  98% 98%  Weight:  87.8 kg    Height:      PainSc:        Intake/Output Summary (Last 24 hours) at 12/10/2020 1115 Last data filed at 12/10/2020 0230 Gross per 24 hour  Intake --  Output 1000 ml  Net -1000 ml   Filed Weights   12/09/20 2118 12/10/20 0249 12/10/20 0342  Weight: 89.4 kg 89.4 kg 87.8 kg   Weight change:   Intake/Output from previous day: 04/12 0701 - 04/13 0700 In: -  Out: 1000 [Urine:1000] Intake/Output this shift: No intake/output data recorded. Filed Weights   12/09/20 2118  12/10/20 0249 12/10/20 0342  Weight: 89.4 kg 89.4 kg 87.8 kg    Examination: General exam: AAOx3,NAD, OBESE, on [place] , weak appearing. HEENT:Oral mucosa moist, Ear/Nose WNL grossly,dentition normal. Respiratory system: bilaterally diminished but no use of accessory muscle, no wheezing, non tender. Cardiovascular system: S1 & S2 +, regular, No JVD. Gastrointestinal system: Abdomen soft, NT,ND, BS+. Nervous System:Alert, awake, moving extremities and grossly nonfocal Extremities: No edema, distal peripheral pulses palpable.  Skin: No rashes,no icterus. MSK: Normal muscle bulk,tone, power  Data Reviewed: I have personally reviewed following labs and imaging studies CBC: Recent Labs  Lab 12/09/20 1638 12/09/20 1720 12/09/20 1816 12/10/20 0241  WBC 28.9*  --   --  35.0*  NEUTROABS 25.2*  --   --   --   HGB 12.3 12.6 12.6 11.5*  HCT 38.2 37.0 37.0 33.9*  MCV 84.1  --   --  83.5  PLT 82*  --   --  68*   Basic Metabolic Panel: Recent Labs  Lab 12/09/20 1638 12/09/20 1720 12/09/20 1816 12/10/20 0241  NA 136 138 138 136  K 2.3* 2.2* 2.4* 3.2*  CL 99  --  98 100  CO2 21*  --   --  25  GLUCOSE 129*  --  124* 168*  BUN 16  --  18 17  CREATININE 1.25*  --  1.00 0.94  CALCIUM 8.4*  --   --  8.3*  MG 1.2*  --   --  1.8   GFR: Estimated Creatinine Clearance: 55.1 mL/min (by C-G formula based on SCr of 0.94 mg/dL). Liver Function Tests: Recent Labs  Lab 12/09/20 1638 12/10/20 0241  AST 34 30  ALT 14 18  ALKPHOS 51 43  BILITOT 1.8* 1.8*  PROT 6.2* 6.6  ALBUMIN 3.2* 3.3*   No results for input(s): LIPASE, AMYLASE in  the last 168 hours. No results for input(s): AMMONIA in the last 168 hours. Coagulation Profile: Recent Labs  Lab 12/10/20 0241  INR 1.6*   Cardiac Enzymes: No results for input(s): CKTOTAL, CKMB, CKMBINDEX, TROPONINI in the last 168 hours. BNP (last 3 results) No results for input(s): PROBNP in the last 8760 hours. HbA1C: No results for input(s):  HGBA1C in the last 72 hours. CBG: Recent Labs  Lab 12/09/20 2122 12/10/20 0633 12/10/20 0851  GLUCAP 156* 169* 180*   Lipid Profile: No results for input(s): CHOL, HDL, LDLCALC, TRIG, CHOLHDL, LDLDIRECT in the last 72 hours. Thyroid Function Tests: No results for input(s): TSH, T4TOTAL, FREET4, T3FREE, THYROIDAB in the last 72 hours. Anemia Panel: No results for input(s): VITAMINB12, FOLATE, FERRITIN, TIBC, IRON, RETICCTPCT in the last 72 hours. Sepsis Labs: Recent Labs  Lab 12/09/20 1714 12/09/20 1914 12/10/20 0241  PROCALCITON  --   --  43.89  LATICACIDVEN 7.4* 5.7*  --     Recent Results (from the past 240 hour(s))  Resp Panel by RT-PCR (Flu A&B, Covid) Nasopharyngeal Swab     Status: None   Collection Time: 12/09/20  5:15 PM   Specimen: Nasopharyngeal Swab; Nasopharyngeal(NP) swabs in vial transport medium  Result Value Ref Range Status   SARS Coronavirus 2 by RT PCR NEGATIVE NEGATIVE Final    Comment: (NOTE) SARS-CoV-2 target nucleic acids are NOT DETECTED.  The SARS-CoV-2 RNA is generally detectable in upper respiratory specimens during the acute phase of infection. The lowest concentration of SARS-CoV-2 viral copies this assay can detect is 138 copies/mL. A negative result does not preclude SARS-Cov-2 infection and should not be used as the sole basis for treatment or other patient management decisions. A negative result may occur with  improper specimen collection/handling, submission of specimen other than nasopharyngeal swab, presence of viral mutation(s) within the areas targeted by this assay, and inadequate number of viral copies(<138 copies/mL). A negative result must be combined with clinical observations, patient history, and epidemiological information. The expected result is Negative.  Fact Sheet for Patients:  EntrepreneurPulse.com.au  Fact Sheet for Healthcare Providers:  IncredibleEmployment.be  This test is  no t yet approved or cleared by the [place] FDA and  has been authorized for detection and/or diagnosis of SARS-CoV-2 by FDA under an Emergency Use Authorization (EUA). This EUA will remain  in effect (meaning this test can be used) for the duration of the COVID-19 declaration under Section 564(b)(1) of the Act, 21 U.S.C.section 360bbb-3(b)(1), unless the authorization is terminated  or revoked sooner.       Influenza A by PCR NEGATIVE NEGATIVE Final   Influenza B by PCR NEGATIVE NEGATIVE Final    Comment: (NOTE) The Xpert Xpress SARS-CoV-2/FLU/RSV plus assay is intended as an aid in the diagnosis of influenza from Nasopharyngeal swab specimens and should not be used as a sole basis for treatment. Nasal washings and aspirates are unacceptable for Xpert Xpress SARS-CoV-2/FLU/RSV testing.  Fact Sheet for Patients: EntrepreneurPulse.com.au  Fact Sheet for Healthcare Providers: IncredibleEmployment.be  This test is not yet approved or cleared by the [place] FDA and has been authorized for detection and/or diagnosis of SARS-CoV-2 by FDA under an Emergency Use Authorization (EUA). This EUA will remain in effect (meaning this test can be used) for the duration of the COVID-19 declaration under Section 564(b)(1) of the Act, 21 U.S.C. section 360bbb-3(b)(1), unless the authorization is terminated or revoked.  Performed at [facility], [place] [address] [place], [place] [address]  Blood culture (routine x 2)     Status: None (Preliminary result)   Collection Time: 12/09/20  5:36 PM   Specimen: BLOOD  Result Value Ref Range Status   Specimen Description BLOOD SITE NOT SPECIFIED  Final   Special Requests   Final    BOTTLES DRAWN AEROBIC AND ANAEROBIC Blood Culture adequate volume   Culture   Final    NO GROWTH < 12 HOURS Performed at [facility], [place] [address], [place], [place] [address]    Report Status PENDING   Incomplete  Blood culture (routine x 2)     Status: None (Preliminary result)   Collection Time: 12/09/20  9:45 PM   Specimen: BLOOD RIGHT WRIST  Result Value Ref Range Status   Specimen Description BLOOD RIGHT WRIST  Final   Special Requests   Final    BOTTLES DRAWN AEROBIC AND ANAEROBIC Blood Culture adequate volume   Culture   Final    NO GROWTH < 12 HOURS Performed at [facility], [place] [address] [place], [place] [address]    Report Status PENDING  Incomplete     Radiology Studies: CT Angio Chest PE W and/or Wo Contrast  Result Date: 12/09/2020 CLINICAL DATA:  Shortness of breath. EXAM: CT ANGIOGRAPHY CHEST WITH CONTRAST TECHNIQUE: Multidetector CT imaging of the chest was performed using the standard protocol during bolus administration of intravenous contrast. Multiplanar CT image reconstructions and MIPs were obtained to evaluate the vascular anatomy. CONTRAST:  41m OMNIPAQUE IOHEXOL 350 MG/ML SOLN COMPARISON:  None. FINDINGS: Cardiovascular: There is marked severity calcification of the aortic arch and descending thoracic aorta, without evidence of aneurysmal dilatation. Satisfactory opacification of the pulmonary arteries to the segmental level. No evidence of pulmonary embolism. Normal heart size with moderate severity coronary artery calcification. No pericardial effusion. Mediastinum/Nodes: No enlarged mediastinal, hilar, or axillary lymph nodes. Thyroid gland, trachea, and esophagus demonstrate no significant findings. Lungs/Pleura: Marked severity infiltrate is seen throughout the right middle lobe and posterior aspect of the right upper lobe. Mild, posterior right basilar atelectasis and/or infiltrate is seen. A 1.9 cm x 1.3 cm noncalcified, nonenhancing soft tissue nodule is seen along the infrahilar aspect of the right lower lobe (axial CT images 57 through 62, CT series number 5). There is a small right pleural effusion. No pneumothorax is identified. Upper Abdomen: The  liver is cirrhotic in appearance. Surgical clips are noted within the gallbladder fossa. Musculoskeletal: Degenerative changes are seen throughout the thoracic spine. Review of the MIP images confirms the above findings. IMPRESSION: 1. Marked severity right middle lobe and right upper lobe infiltrate. 2. Mild right basilar atelectasis and/or infiltrate. 3. Small right pleural effusion. 4. 1.9 cm x 1.3 cm right infrahilar soft tissue nodule. Consider one of the following in 3 months for both low-risk and high-risk individuals: (a) repeat chest CT, (b) follow-up PET-CT, or (c) tissue sampling. This recommendation follows the consensus statement: Guidelines for Management of Incidental Pulmonary Nodules Detected on CT Images: From the Fleischner Society 2017; Radiology 2017; 284:228-243. 5. Cirrhotic liver. 6. Aortic atherosclerosis. Aortic Atherosclerosis (ICD10-I70.0). Electronically Signed   By: TVirgina NorfolkM.D.   On: 12/09/2020 19:32   DG Chest Port 1 View  Result Date: 12/10/2020 CLINICAL DATA:  Shortness of breath in a patient with pneumonia. EXAM: PORTABLE CHEST 1 VIEW COMPARISON:  Single-view of the chest and CT chest 12/09/2020. FINDINGS: Airspace disease throughout the right chest has worsened with marked increase in the density of airspace disease  in the right mid lung zone. The left lung remains clear. Heart size is upper normal. Aortic atherosclerosis. No pneumothorax or pleural fluid. IMPRESSION: Worsened airspace disease in the right chest consistent with progressive pneumonia. Electronically Signed   By: Inge Rise M.D.   On: 12/10/2020 09:43   DG Chest Port 1 View  Result Date: 12/09/2020 CLINICAL DATA:  Short of breath for 3 days, history of asthma and diabetes EXAM: PORTABLE CHEST 1 VIEW COMPARISON:  12/07/2019 FINDINGS: 2 frontal views of the chest demonstrate an unremarkable cardiac silhouette. There is diffuse right-sided airspace disease greatest in the mid and lower lung  zones, likely involving the right middle and right lower lobes. No large effusion or pneumothorax. No acute bony abnormalities. IMPRESSION: 1. Significant right-sided airspace disease consistent with right middle and right lower lobe pneumonia. Followup PA and lateral chest X-ray is recommended in 3-4 weeks following trial of antibiotic therapy to ensure resolution and exclude underlying malignancy. Electronically Signed   By: Randa Ngo M.D.   On: 12/09/2020 17:06     LOS: 1 day   Antonieta Pert, MD [facility]  12/10/2020, 11:15 AM

## 2020-12-10 NOTE — Progress Notes (Signed)
Pt.has not voided since this morning .Bladder scan done & obtained 570cc urine  Output.MD on call was made aware & ordered to do in & out cath.x1.

## 2020-12-10 NOTE — Progress Notes (Signed)
I came back by to reassess Rachael Buck. I agree w/ nursing staff her WOB is concerning. Her oxygen needs have gone up. Right now she is hemodynamically stable. I do think that this should be treatable (see our consult note). Spoke w/ daughter who wants Full code in this situation but also voices understanding that were things to get to point where interventions were futile she would not want to put her mother through that either.   Plan at this point: Cont rx as outlined in our consult note Add BIPAP Will have our night team circle by to eval  Full code   She does seem at risk for decompensation but my hope is that if we decrease her WOB and support this we can avoid it.   Erick Colace ACNP-BC [place] Pager # [phone] OR # [phone] if no answer

## 2020-12-11 ENCOUNTER — Inpatient Hospital Stay (HOSPITAL_COMMUNITY): Payer: PPO

## 2020-12-11 DIAGNOSIS — R6520 Severe sepsis without septic shock: Secondary | ICD-10-CM | POA: Diagnosis not present

## 2020-12-11 DIAGNOSIS — J189 Pneumonia, unspecified organism: Secondary | ICD-10-CM | POA: Diagnosis not present

## 2020-12-11 DIAGNOSIS — A419 Sepsis, unspecified organism: Secondary | ICD-10-CM | POA: Diagnosis not present

## 2020-12-11 LAB — CBC
HCT: 29.1 % — ABNORMAL LOW (ref 36.0–46.0)
Hemoglobin: 9.5 g/dL — ABNORMAL LOW (ref 12.0–15.0)
MCH: 27.5 pg (ref 26.0–34.0)
MCHC: 32.6 g/dL (ref 30.0–36.0)
MCV: 84.1 fL (ref 80.0–100.0)
Platelets: 58 10*3/uL — ABNORMAL LOW (ref 150–400)
RBC: 3.46 MIL/uL — ABNORMAL LOW (ref 3.87–5.11)
RDW: 18.5 % — ABNORMAL HIGH (ref 11.5–15.5)
WBC: 15.6 10*3/uL — ABNORMAL HIGH (ref 4.0–10.5)
nRBC: 0 % (ref 0.0–0.2)

## 2020-12-11 LAB — BASIC METABOLIC PANEL
Anion gap: 9 (ref 5–15)
BUN: 19 mg/dL (ref 8–23)
CO2: 26 mmol/L (ref 22–32)
Calcium: 8.3 mg/dL — ABNORMAL LOW (ref 8.9–10.3)
Chloride: 103 mmol/L (ref 98–111)
Creatinine, Ser: 0.73 mg/dL (ref 0.44–1.00)
GFR, Estimated: >60 mL/min (ref >60)
Glucose, Bld: 105 mg/dL — ABNORMAL HIGH (ref 70–99)
Potassium: 2.9 mmol/L — ABNORMAL LOW (ref 3.5–5.1)
Sodium: 138 mmol/L (ref 135–145)

## 2020-12-11 LAB — LEGIONELLA PNEUMOPHILA SEROGP 1 UR AG: L. pneumophila Serogp 1 Ur Ag: NEGATIVE

## 2020-12-11 LAB — GLUCOSE, CAPILLARY
Glucose-Capillary: 99 mg/dL (ref 70–99)
Glucose-Capillary: 107 mg/dL — ABNORMAL HIGH (ref 70–99)
Glucose-Capillary: 83 mg/dL (ref 70–99)
Glucose-Capillary: 115 mg/dL — ABNORMAL HIGH (ref 70–99)

## 2020-12-11 LAB — PROCALCITONIN: Procalcitonin: 24 ng/mL

## 2020-12-11 LAB — POTASSIUM: Potassium: 4.3 mmol/L (ref 3.5–5.1)

## 2020-12-11 MED ORDER — POTASSIUM CHLORIDE 20 MEQ PO PACK
40.0000 meq | PACK | ORAL | Status: AC
  Administered 2020-12-11 (×2): 40 meq via ORAL
  Filled 2020-12-11 (×2): qty 2

## 2020-12-11 MED ORDER — POTASSIUM CHLORIDE 10 MEQ/100ML IV SOLN
10.0000 meq | INTRAVENOUS | Status: AC
  Administered 2020-12-11 (×2): 10 meq via INTRAVENOUS
  Filled 2020-12-11: qty 100

## 2020-12-11 NOTE — Evaluation (Signed)
Physical Therapy Evaluation Patient Details Name: Rachael Buck MRN: [id number] DOB: 1940/09/04 Today's Date: 12/11/2020   History of Present Illness  80 y.o. female presents to [facility] ED on 12/09/2020 with reports of SOB and cough. Pt admitted for sepsis 2/2 PNA. On 4/13 pt with worsening consolidation on CXR, pt requiring BiPAP due to worsening respiratory status. PMH includes NASH w/ Cirrhosis, Diabetes, HTN, GERD, HL, Morbid Obesity, Asthma, anxiety disorder.  Clinical Impression  Pt presents to PT with deficits in strength, power, balance, gait, endurance, and with mild cognitive deficits. Pt requires PT assistance for safety at this time with mild imbalance despite use of RW. Pt also fatigues quickly and will benefit from continued acute PT services to improve activity tolerance and provide energy conservation strategies. PT recommends HHPT services at this time, as well as continued use of a RW for all out of bed mobility.    Follow Up Recommendations Home health PT    Equipment Recommendations  None recommended by PT (pt owns necessary DME)    Recommendations for Other Services       Precautions / Restrictions Precautions Precautions: Fall;Other (comment) Precaution Comments: monitor SpO2 and O2 levels Restrictions Weight Bearing Restrictions: No      Mobility  Bed Mobility Overal bed mobility: Needs Assistance Bed Mobility: Supine to Sit;Sit to Supine     Supine to sit: Supervision;HOB elevated Sit to supine: Supervision        Transfers Overall transfer level: Needs assistance Equipment used: Rolling walker (2 wheeled) Transfers: Sit to/from Stand Sit to Stand: Min guard         General transfer comment: increased time to extend knees/hips, verbal cueing for hand placement and DME management  Ambulation/Gait Ambulation/Gait assistance: Min guard Gait Distance (Feet): 60 Feet Assistive device: Rolling walker (2 wheeled) Gait Pattern/deviations:  Step-through pattern Gait velocity: reduced Gait velocity interpretation: <1.8 ft/sec, indicate of risk for recurrent falls General Gait Details: pt with slowed step-through gait, mild increase in lateral sway  Stairs            Wheelchair Mobility    Modified Rankin (Stroke Patients Only)       Balance Overall balance assessment: Needs assistance Sitting-balance support: No upper extremity supported;Feet supported Sitting balance-Leahy Scale: Good     Standing balance support: Single extremity supported;Bilateral upper extremity supported Standing balance-Leahy Scale: Poor Standing balance comment: reliant on UE support                             Pertinent Vitals/Pain Pain Assessment: No/denies pain    Home Living Family/patient expects to be discharged to:: Private residence Living Arrangements: Spouse/significant other Available Help at Discharge: Family;Available 24 hours/day Type of Home: House Home Access: Stairs to enter Entrance Stairs-Rails: Right Entrance Stairs-Number of Steps: 4 Home Layout: One level Home Equipment: Walker - 2 wheels;Cane - single point;Wheelchair - manual;Shower seat;Grab bars - toilet;Grab bars - tub/shower      Prior Function Level of Independence: Independent         Comments: driving     Hand Dominance   Dominant Hand: Right    Extremity/Trunk Assessment   Upper Extremity Assessment Upper Extremity Assessment: Overall WFL for tasks assessed    Lower Extremity Assessment Lower Extremity Assessment: Generalized weakness    Cervical / Trunk Assessment Cervical / Trunk Assessment: Kyphotic  Communication   Communication: No difficulties  Cognition Arousal/Alertness: Awake/alert Behavior During Therapy: WFL for tasks  assessed/performed Overall Cognitive Status: Impaired/Different from baseline Area of Impairment: Memory                     Memory: Decreased short-term memory          General Comments: pt reports walking to bathroom, then shortly after forgets she had walked to bathroom earlier in the day      General Comments General comments (skin integrity, edema, etc.): pt on 5L [place] at rest with sats in high 90s. PT places pt on 6L [place] for mobility with maintained sats in high 90s, however pt reports 8/10 DOE with ambulation and fatigues quickly.    Exercises     Assessment/Plan    PT Assessment Patient needs continued PT services  PT Problem List Decreased strength;Decreased activity tolerance;Decreased balance;Decreased mobility;Decreased knowledge of use of DME;Cardiopulmonary status limiting activity       PT Treatment Interventions      PT Goals (Current goals can be found in the Care Plan section)  Acute Rehab PT Goals Patient Stated Goal: to return to independent mobility PT Goal Formulation: With patient/family Time For Goal Achievement: 12/25/20 Potential to Achieve Goals: Good    Frequency Min 3X/week   Barriers to discharge        Co-evaluation               AM-PAC PT "6 Clicks" Mobility  Outcome Measure Help needed turning from your back to your side while in a flat bed without using bedrails?: A Little Help needed moving from lying on your back to sitting on the side of a flat bed without using bedrails?: A Little Help needed moving to and from a bed to a chair (including a wheelchair)?: A Little Help needed standing up from a chair using your arms (e.g., wheelchair or bedside chair)?: A Little Help needed to walk in hospital room?: A Little Help needed climbing 3-5 steps with a railing? : A Little 6 Click Score: 18    End of Session Equipment Utilized During Treatment: Oxygen Activity Tolerance: Patient limited by fatigue Patient left: in bed;with call bell/phone within reach;with bed alarm set;with family/visitor present Nurse Communication: Mobility status PT Visit Diagnosis: Other abnormalities of gait and mobility  (R26.89);Muscle weakness (generalized) (M62.81)    Time: 6213-0865 PT Time Calculation (min) (ACUTE ONLY): 27 min   Charges:   PT Evaluation $PT Eval Moderate Complexity: 1 Mod          Zenaida Niece, PT, DPT Acute Rehabilitation Pager: [phone]   Zenaida Niece 12/11/2020, 2:11 PM

## 2020-12-11 NOTE — Progress Notes (Signed)
NAME:  Rachael Buck, MRN:  [id number], DOB:  Aug 11, 1941, LOS: 2 ADMISSION DATE:  12/09/2020, CONSULTATION DATE:  4/13 REFERRING MD:  Lupita Leash, CHIEF COMPLAINT:  Abnormal CXR (worsening)   History of Present Illness:  This is a 80 year old female w/ hx as outlined below. Presented to the ER on 4/12 w/ cc: several day h/o sore throat, sinus pressure, post nasal gtt then 1 d h/o marked fatigue and malaise, increasing shortness of breath & cough. Dyspnea progressed to resting SOB. Did not report fever but was diaphoretic.  On arrival low gd temp 99.7, room air sats 90%, LA 5.7 w/ inc'd RR. WBC ct 28.9. CT imaging to further eval CXR showed fairly sig RUL and RML airspace disease w/ marked area of consolidations, small right effusion and 1.9 x 1.3 cm soft tissue nodule right infrahilar best noted on series 5 images 57-62. She was started on supplemental oxygen and antibiotics. On 4/13 feeling better but CXR worse so Pulm asked to see.   Pertinent  Medical History  NASH w/ Cirrhosis, Diabetes, HTN, GERD, HL, Morbid Obesity, Asthma, anxiety disorder.   Significant Hospital Events: Including procedures, antibiotic start and stop dates in addition to other pertinent events   . 4/12 admitted w/ working dx of CAP. CT chest RUL and RML airspace disease w/ marked area of consolidations, small right effusion and 1.9 x 1.3 cm soft tissue nodule right infrahilar best noted on series 5 images 57-62. Azithro and Ceftriaxone started. Resp viral panel was negative.  . 4/13 procalcitonin 43.9, wbc ct 28.9 to 35  (but had received 157m steroids 4/12). Fever curve not much changed. CXR w/ worsening consolidation and overall worsening of right sided airspace disease. Because of this PCCM consulted. ABX changed to Vancomycin, zosyn and azithromycin.  MRSA neg, clearing lactate  Interim History / Subjective:  tmax 99.5 clinically improving / hemodynamically stable  Daughter at bedside states she was hallucinating  overnight seeing things.  She remains wide awake on minimal BiPAP settings No complaints per patient PCT 43.89->24 WBC 35-> 15.6  Objective   Blood pressure 123/63, pulse 92, temperature (!) 97.1 F (36.2 C), temperature source Axillary, resp. rate 19, height 5' 6"  (1.676 m), weight 88.5 kg, SpO2 98 %.        Intake/Output Summary (Last 24 hours) at 12/11/2020 0815 Last data filed at 12/11/2020 03159Gross per 24 hour  Intake 463.59 ml  Output 500 ml  Net -36.41 ml   Filed Weights   12/10/20 0249 12/10/20 0342 12/11/20 0324  Weight: 89.4 kg 87.8 kg 88.5 kg    Examination: General:  Pleasant elderly female sitting upright in bed in NAD wide awake HEENT: full face NIV mask  Neuro: Awake, f/c, MAE CV: rr, no murmur PULM:  Non labored on NIV 12/6, 30%, initially tachypneic into the 40's when I entered the room, but came nicely down in lower 20's, lungs clear anteriorly, diminished in right base, no wheeze or rales GI: obese, soft, bs active  Extremities: warm/dry, no LE edema  Skin: no rashes   Daughter at bedside   Labs/imaging that I havepersonally reviewed  (right click and "Reselect all SmartList Selections" daily)  4/12 chest CT  CBC, BMET, PCT -> K 2.9, (Mag 1.8 yest on mag ox replete), WBC 35-> 15.6, PCT 43-> 24, stable thrombocytopenia 68-> 58  Culture data- BCx 4/12 ngtd, MRSA neg  Resolved Hospital Problem list     Assessment & Plan:   Dyspnea  CAP  involving mostly RM and RLL Severe Sepsis 2/2 CAP  Lactic acidosis  H/o allergic rhinitis and seasonal allergies Right perihilar lung nodule H/o reactive airway disease/asthma (no PFTs dx'd in 69s. Only an issue typically during allergy season) Leukocytosis  H/o cirrhosis in context of NASH H/o diabetes H/o HTN H/o Obesity  H/o anxiety  DNR status, changed back to full code 4/13, see PB note   Pulmonary problem list   Severe CAP- RML/ RLL Hypoxic respiratory failure  Prior smoker  - CXR reviewed today  stable right infiltrates  - d/c vanc with negative MRSA, continue zosyn/ azithro for now - continue BiPAP for now, may trial periods off - ice chips/ sips with meds for now, if tolerates longer trials off BiPAP can advance to clear liquids to avoid aspiration risk - wean FiO2 for sat goal > 90% - improving PCT/ WBC, continue to trend  - continue scheduled BD and ICS - ongoing pulm hygiene - urine strep neg, urine legionella pending - she remains a full code, but daughter does not want prolonged or futile care - may need SLP eval   Sepsis 2/2 CAP w/ lactic acidosis  Plan Improving, lactate clearing 4/13 abx as above  Lung Nodule: best noted on series 5 images 57-62 Plan Outpatient f/u for 3 month repeat imaging   Hypokalemia - replete per primary, getting mag replacement as well as of 4/13  ?acute delirium, likely related to sepsis +/- steroids - no indication for acute hypercarbic failure given her MVe and how awake she is, will hold off on ABG for now   Best practice (right click and "Reselect all SmartList Selections" daily)  Per primary       Kennieth Rad, ACNP [facility] 12/11/2020, 8:16 AM

## 2020-12-11 NOTE — Progress Notes (Signed)
PROGRESS NOTE    Rachael Buck  TMA:[id number] DOB: May 03, 1941 DOA: 12/09/2020 PCP: [name], MD   Chief Complaint  Patient presents with  . Shortness of Breath  Brief Narrative: 80 year old female with history of NASH/cirrhosis, diabetes, HTN, GERD, HLD, morbid obesity, asthma, anxiety disorder presented with progressive shortness of breath, cough for few days. In the ED low-grade temp nine 9.7 tachycardic oxygen 90% room air with significant leukocytosis elevated procalcitonin, hypokalemia and thrombocytopenia. 80 y.o. female with medical history significant of NASH cirrhosis, diabetes, hypertension, GERD, hyperlipidemia, morbid obesity, asthma and anxiety disorder who presented to the ER with progressive shortness of breath and cough for a few days, Lactic acidosis 7.4> 5.7. CT angiogram of the chest showed no PE but right middle lobe and right upper lobe infiltrates.  There is mild right basilar atelectasis on or infiltrate.  Small right pleural effusion.  Patient also has a 1.9 x 1.2 cm right infrahilar soft tissue nodule as well as evidence of cirrhotic liver.  Given IV fluids boluses 2 L, IV Solu-Medrol and was admitted.   Subjective:  Seen and examined this morning.  She appears much improved was off BiPAP and nasal cannula and was about to go to the bathroom with nursing assistant with walker. Placed on BiPAP overnight per ICU team for work of breathing k low this am,  Overnight also needing in and out cath Afebrile, leukocytosis/procalcitonin significantly down  Assessment & Plan:  Severe sepsis POA-with leukocytosis/lactic acidosis due to pneumonia Multifocal pneumonia  Multifocal pneumonia RML RUL Yesterday placed on BiPAP by ICU team due to work of breathing.  Antibiotic were changed to vancomycin and Zosyn and azithromycin 4/13-MRSA PCR negative-can DC vancomycin. Blood culture no growth so far, chest x-ray this morning stable consolidation.  Continued  aggressive bronchodilators BiPAP/supplemental oxygen.  Appreciate pulmonary follow-up.  WBC count and Pro-Cal level is now downtrending.  Recent Labs  Lab 12/09/20 1638 12/09/20 1714 12/09/20 1914 12/10/20 0241 12/10/20 1125 12/10/20 1836 12/11/20 0255  WBC 28.9*  --   --  35.0*  --   --  15.6*  LATICACIDVEN  --  7.4* 5.7*  --  3.7* 2.6*  --   PROCALCITON  --   --   --  43.89  --   --  24.00   Acute hypoxic and hypercapnic respiratory failure needing 4 L nasal cannula-normally on oxygen at home.  Changed to BiPAP yesterday by ICU team for work of breathing.  Continue plan for BiPAP/supplemental oxygen as per pulmonary. continue antibiotics as above.   Uncontrolled diabetes mellitus, controlled HbA1c 6.7, a month ago.  Blood sugar fairly controlled on sliding scale insulin, jardiance and oral glipizide. Recent Labs  Lab 12/10/20 0633 12/10/20 0851 12/10/20 1152 12/10/20 1605 12/10/20 2059  GLUCAP 169* 180* 158* 141* 171*   Hypokalemia again low, being repleted aggressively PO + IV- recheck in the afternoon Hypomagnesemia-level much better, cont magnesium oxide orally. Acute renal failure creatinine elevated 1.2 on admission improved to 0.7, follow-up electrolytes Recent Labs  Lab 12/09/20 1638 12/09/20 1816 12/10/20 0241 12/11/20 0255  BUN 16 18 17 19   CREATININE 1.25* 1.00 0.94 0.73   Urine retention needing in and out cath overnight monitor urine output Intake/Output Summary (Last 24 hours) at 12/11/2020 0806 Last data filed at 12/11/2020 0653 Gross per 24 hour  Intake 463.59 ml  Output 500 ml  Net -36.41 ml   HTN: Stable, continue to hold home meds for now  NASH w/ cirrhosis of liver:  She reports she follows up with Dr. Marin Olp.  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  Likely due to acute illness. Monitor Recent Labs  Lab 12/09/20 1638 12/10/20 0241 12/11/20 0255  PLT 82* 68* 103*   Incidentally noted abnormal CT  chest with  "1.9 cm x 1.3 cm noncalcified, nonenhancing soft tissue nodule is seen along the infrahilar aspect of the right lower lobe": Per recommendation the family and will follow up with PCP/Dr. Marin Olp Lung nodule outpatient follow-up  Morbid obesity BMI more than 30 will benefit with weight loss PCP follow-up, as well as sleep apnea evaluation. Diet Order            Diet heart healthy/carb modified Room service appropriate? Yes; Fluid consistency: Thin  Diet effective now               Patient's Body mass index is 31.49 kg/m.  DVT prophylaxis: enoxaparin (LOVENOX) injection 40 mg Start: 12/09/20 2200- hold and change to scd 2/2 low platelets Code Status:   Code Status: Full Code  Family Communication: plan of care discussed with patient at bedside. Discussed with nursing staff.  Family not at the bedside this morning.  Status is: Inpatient Remains inpatient appropriate because:IV treatments appropriate due to intensity of illness or inability to take PO and Inpatient level of care appropriate due to severity of illness for ongoing management of respiratory failure sepsis.  Dispo: The patient is from: Home              Anticipated d/c is to: TBD.  Obtain PT OT evaluation once stable              Patient currently is not medically stable to d/c.   Difficult to place patient No  Unresulted Labs (From admission, onward)          Start     Ordered   12/16/20 0500  Creatinine, serum  (enoxaparin (LOVENOX)    CrCl >/= 30 ml/min)  Weekly,   R     Comments: while on enoxaparin therapy    12/09/20 2113   12/11/20 0500  Procalcitonin  Daily,   R     Question:  Specimen collection method  Answer:  Lab=Lab collect   12/10/20 0907   12/11/20 6213  Basic metabolic panel  Daily,   R     Question:  Specimen collection method  Answer:  Lab=Lab collect   12/10/20 1121   12/11/20 0500  CBC  Daily,   R     Question:  Specimen collection method  Answer:  Lab=Lab collect   12/10/20 1121    12/10/20 1310  Legionella Pneumophila Serogp 1 Ur Ag  Once,   R        12/10/20 1310         Medications reviewed:  Scheduled Meds: . atorvastatin  40 mg Oral Daily  . budesonide (PULMICORT) nebulizer solution  0.5 mg Nebulization BID  . colestipol  1 g Oral Daily  . dicyclomine  20 mg Oral TID AC & HS  . empagliflozin  10 mg Oral Daily  . enoxaparin (LOVENOX) injection  40 mg Subcutaneous Q24H  . glipiZIDE  5 mg Oral Q breakfast  . guaiFENesin  600 mg Oral BID  . hydrocortisone cream   Topical BID  . insulin aspart  0-15 Units Subcutaneous TID WC  . insulin aspart  0-5 Units Subcutaneous QHS  . ipratropium-albuterol  3 mL Nebulization  Q6H  . magnesium oxide  400 mg Oral BID  . mouth rinse  15 mL Mouth Rinse BID  . pantoprazole  40 mg Oral Daily  . sertraline  100 mg Oral Daily   Continuous Infusions: . azithromycin 500 mg (12/10/20 1759)  . piperacillin-tazobactam (ZOSYN)  IV 3.375 g (12/11/20 0424)  . vancomycin      Consultants:see note  Procedures:see note  Antimicrobials: Anti-infectives (From admission, onward)   Start     Dose/Rate Route Frequency Ordered Stop   12/11/20 1500  vancomycin (VANCOREADY) IVPB 1500 mg/300 mL        1,500 mg 150 mL/hr over 120 Minutes Intravenous Every 24 hours 12/10/20 1211     12/10/20 2000  piperacillin-tazobactam (ZOSYN) IVPB 3.375 g        3.375 g 12.5 mL/hr over 240 Minutes Intravenous Every 8 hours 12/10/20 1211     12/10/20 1800  cefTRIAXone (ROCEPHIN) 2 g in sodium chloride 0.9 % 100 mL IVPB  Status:  Discontinued        2 g 200 mL/hr over 30 Minutes Intravenous Every 24 hours 12/09/20 2113 12/10/20 1210   12/10/20 1800  azithromycin (ZITHROMAX) 500 mg in sodium chloride 0.9 % 250 mL IVPB  Status:  Discontinued        500 mg 250 mL/hr over 60 Minutes Intravenous Every 24 hours 12/09/20 2113 12/10/20 1210   12/10/20 1800  azithromycin (ZITHROMAX) 500 mg in sodium chloride 0.9 % 250 mL IVPB        500 mg 250 mL/hr over 60  Minutes Intravenous Every 24 hours 12/10/20 1212     12/10/20 1500  vancomycin (VANCOREADY) IVPB 1750 mg/350 mL        1,750 mg 175 mL/hr over 120 Minutes Intravenous Once 12/10/20 1409 12/10/20 1646   12/10/20 1300  vancomycin (VANCOCIN) 1,750 mg in sodium chloride 0.9 % 500 mL IVPB  Status:  Discontinued        1,750 mg 250 mL/hr over 120 Minutes Intravenous  Once 12/10/20 1211 12/10/20 1500   12/10/20 1300  piperacillin-tazobactam (ZOSYN) IVPB 3.375 g        3.375 g 100 mL/hr over 30 Minutes Intravenous  Once 12/10/20 1211 12/10/20 1418   12/09/20 1715  cefTRIAXone (ROCEPHIN) 1 g in sodium chloride 0.9 % 100 mL IVPB        1 g 200 mL/hr over 30 Minutes Intravenous  Once 12/09/20 1714 12/09/20 1821   12/09/20 1715  azithromycin (ZITHROMAX) 500 mg in sodium chloride 0.9 % 250 mL IVPB        500 mg 250 mL/hr over 60 Minutes Intravenous  Once 12/09/20 1714 12/09/20 1921     Culture/Microbiology    Component Value Date/Time   SDES BLOOD RIGHT WRIST 12/09/2020 2145   SPECREQUEST  12/09/2020 2145    BOTTLES DRAWN AEROBIC AND ANAEROBIC Blood Culture adequate volume   CULT  12/09/2020 2145    NO GROWTH < 12 HOURS Performed at [facility], [address], [place], [place] [address]    REPTSTATUS PENDING 12/09/2020 2145    Other culture-see note  Objective: Vitals: Today's Vitals   12/10/20 2209 12/11/20 0210 12/11/20 0324 12/11/20 0413  BP: (!) 144/54   123/63  Pulse: (!) 107   92  Resp: (!) 29   19  Temp:    (!) 97.1 F (36.2 C)  TempSrc:    Axillary  SpO2: 98% 98%  98%  Weight:   88.5 kg  Height:      PainSc:        Intake/Output Summary (Last 24 hours) at 12/11/2020 0757 Last data filed at 12/11/2020 0653 Gross per 24 hour  Intake 463.59 ml  Output 500 ml  Net -36.41 ml   Filed Weights   12/10/20 0249 12/10/20 0342 12/11/20 0324  Weight: 89.4 kg 87.8 kg 88.5 kg   Weight change: -0.9 kg  Intake/Output from previous day: 04/13 0701 - 04/14 0700 In: 463.6  [P.O.:100; IV Piggyback:363.6] Out: 500 [Urine:500] Intake/Output this shift: No intake/output data recorded. Filed Weights   12/10/20 0249 12/10/20 0342 12/11/20 0324  Weight: 89.4 kg 87.8 kg 88.5 kg    Examination: General exam: AAO, obese, able to speak in full sentences not in distress HEENT:Oral mucosa moist, Ear/Nose WNL grossly, dentition normal. Respiratory system: bilaterally Ureh present mild wheezing in the right posterior lungs Cardiovascular system: S1 & S2 +, No JVD,. Gastrointestinal system: Abdomen soft,obese,NT,ND, BS+ Nervous System:Alert, awake, moving extremities and grossly nonfocal Extremities: No edema, distal peripheral pulses palpable.  Skin: No rashes,no icterus. MSK: Normal muscle bulk,tone, power  Data Reviewed: I have personally reviewed following labs and imaging studies CBC: Recent Labs  Lab 12/09/20 1638 12/09/20 1720 12/09/20 1816 12/10/20 0241 12/11/20 0255  WBC 28.9*  --   --  35.0* 15.6*  NEUTROABS 25.2*  --   --   --   --   HGB 12.3 12.6 12.6 11.5* 9.5*  HCT 38.2 37.0 37.0 33.9* 29.1*  MCV 84.1  --   --  83.5 84.1  PLT 82*  --   --  68* 58*   Basic Metabolic Panel: Recent Labs  Lab 12/09/20 1638 12/09/20 1720 12/09/20 1816 12/10/20 0241 12/11/20 0255  NA 136 138 138 136 138  K 2.3* 2.2* 2.4* 3.2* 2.9*  CL 99  --  98 100 103  CO2 21*  --   --  25 26  GLUCOSE 129*  --  124* 168* 105*  BUN 16  --  18 17 19   CREATININE 1.25*  --  1.00 0.94 0.73  CALCIUM 8.4*  --   --  8.3* 8.3*  MG 1.2*  --   --  1.8  --    GFR: Estimated Creatinine Clearance: 65 mL/min (by C-G formula based on SCr of 0.73 mg/dL). Liver Function Tests: Recent Labs  Lab 12/09/20 1638 12/10/20 0241  AST 34 30  ALT 14 18  ALKPHOS 51 43  BILITOT 1.8* 1.8*  PROT 6.2* 6.6  ALBUMIN 3.2* 3.3*   No results for input(s): LIPASE, AMYLASE in the last 168 hours. No results for input(s): AMMONIA in the last 168 hours. Coagulation Profile: Recent Labs  Lab  12/10/20 0241  INR 1.6*   Cardiac Enzymes: No results for input(s): CKTOTAL, CKMB, CKMBINDEX, TROPONINI in the last 168 hours. BNP (last 3 results) No results for input(s): PROBNP in the last 8760 hours. HbA1C: No results for input(s): HGBA1C in the last 72 hours. CBG: Recent Labs  Lab 12/10/20 0633 12/10/20 0851 12/10/20 1152 12/10/20 1605 12/10/20 2059  GLUCAP 169* 180* 158* 141* 171*   Lipid Profile: No results for input(s): CHOL, HDL, LDLCALC, TRIG, CHOLHDL, LDLDIRECT in the last 72 hours. Thyroid Function Tests: No results for input(s): TSH, T4TOTAL, FREET4, T3FREE, THYROIDAB in the last 72 hours. Anemia Panel: No results for input(s): VITAMINB12, FOLATE, FERRITIN, TIBC, IRON, RETICCTPCT in the last 72 hours. Sepsis Labs: Recent Labs  Lab 12/09/20 1714 12/09/20 1914 12/10/20 0241 12/10/20 1125  12/10/20 1836 12/11/20 0255  PROCALCITON  --   --  43.89  --   --  24.00  LATICACIDVEN 7.4* 5.7*  --  3.7* 2.6*  --     Recent Results (from the past 240 hour(s))  Resp Panel by RT-PCR (Flu A&B, Covid) Nasopharyngeal Swab     Status: None   Collection Time: 12/09/20  5:15 PM   Specimen: Nasopharyngeal Swab; Nasopharyngeal(NP) swabs in vial transport medium  Result Value Ref Range Status   SARS Coronavirus 2 by RT PCR NEGATIVE NEGATIVE Final    Comment: (NOTE) SARS-CoV-2 target nucleic acids are NOT DETECTED.  The SARS-CoV-2 RNA is generally detectable in upper respiratory specimens during the acute phase of infection. The lowest concentration of SARS-CoV-2 viral copies this assay can detect is 138 copies/mL. A negative result does not preclude SARS-Cov-2 infection and should not be used as the sole basis for treatment or other patient management decisions. A negative result may occur with  improper specimen collection/handling, submission of specimen other than nasopharyngeal swab, presence of viral mutation(s) within the areas targeted by this assay, and inadequate  number of viral copies(<138 copies/mL). A negative result must be combined with clinical observations, patient history, and epidemiological information. The expected result is Negative.  Fact Sheet for Patients:  EntrepreneurPulse.com.au  Fact Sheet for Healthcare Providers:  IncredibleEmployment.be  This test is no t yet approved or cleared by the [place] FDA and  has been authorized for detection and/or diagnosis of SARS-CoV-2 by FDA under an Emergency Use Authorization (EUA). This EUA will remain  in effect (meaning this test can be used) for the duration of the COVID-19 declaration under Section 564(b)(1) of the Act, 21 U.S.C.section 360bbb-3(b)(1), unless the authorization is terminated  or revoked sooner.       Influenza A by PCR NEGATIVE NEGATIVE Final   Influenza B by PCR NEGATIVE NEGATIVE Final    Comment: (NOTE) The Xpert Xpress SARS-CoV-2/FLU/RSV plus assay is intended as an aid in the diagnosis of influenza from Nasopharyngeal swab specimens and should not be used as a sole basis for treatment. Nasal washings and aspirates are unacceptable for Xpert Xpress SARS-CoV-2/FLU/RSV testing.  Fact Sheet for Patients: EntrepreneurPulse.com.au  Fact Sheet for Healthcare Providers: IncredibleEmployment.be  This test is not yet approved or cleared by the [place] FDA and has been authorized for detection and/or diagnosis of SARS-CoV-2 by FDA under an Emergency Use Authorization (EUA). This EUA will remain in effect (meaning this test can be used) for the duration of the COVID-19 declaration under Section 564(b)(1) of the Act, 21 U.S.C. section 360bbb-3(b)(1), unless the authorization is terminated or revoked.  Performed at [facility], [place] [address] [place], [place] [address]   Blood culture (routine x 2)     Status: None (Preliminary result)   Collection Time: 12/09/20  5:36 PM    Specimen: BLOOD  Result Value Ref Range Status   Specimen Description BLOOD SITE NOT SPECIFIED  Final   Special Requests   Final    BOTTLES DRAWN AEROBIC AND ANAEROBIC Blood Culture adequate volume   Culture   Final    NO GROWTH < 12 HOURS Performed at [facility], [place] [address], [place], [place] [address]    Report Status PENDING  Incomplete  Blood culture (routine x 2)     Status: None (Preliminary result)   Collection Time: 12/09/20  9:45 PM   Specimen: BLOOD RIGHT WRIST  Result Value Ref Range Status  Specimen Description BLOOD RIGHT WRIST  Final   Special Requests   Final    BOTTLES DRAWN AEROBIC AND ANAEROBIC Blood Culture adequate volume   Culture   Final    NO GROWTH < 12 HOURS Performed at [facility], [address], [place], [place] [address]    Report Status PENDING  Incomplete  MRSA PCR Screening     Status: None   Collection Time: 12/10/20 11:26 AM   Specimen: Nasal Mucosa; Nasopharyngeal  Result Value Ref Range Status   MRSA by PCR NEGATIVE NEGATIVE Final    Comment:        The GeneXpert MRSA Assay (FDA approved for NASAL specimens only), is one component of a comprehensive MRSA colonization surveillance program. It is not intended to diagnose MRSA infection nor to guide or monitor treatment for MRSA infections. Performed at [facility], [place] [address], [place], [place] [address]      Radiology Studies: CT Angio Chest PE W and/or Wo Contrast  Result Date: 12/09/2020 CLINICAL DATA:  Shortness of breath. EXAM: CT ANGIOGRAPHY CHEST WITH CONTRAST TECHNIQUE: Multidetector CT imaging of the chest was performed using the standard protocol during bolus administration of intravenous contrast. Multiplanar CT image reconstructions and MIPs were obtained to evaluate the vascular anatomy. CONTRAST:  100m OMNIPAQUE IOHEXOL 350 MG/ML SOLN COMPARISON:  None. FINDINGS: Cardiovascular: There is marked severity calcification of the aortic arch and  descending thoracic aorta, without evidence of aneurysmal dilatation. Satisfactory opacification of the pulmonary arteries to the segmental level. No evidence of pulmonary embolism. Normal heart size with moderate severity coronary artery calcification. No pericardial effusion. Mediastinum/Nodes: No enlarged mediastinal, hilar, or axillary lymph nodes. Thyroid gland, trachea, and esophagus demonstrate no significant findings. Lungs/Pleura: Marked severity infiltrate is seen throughout the right middle lobe and posterior aspect of the right upper lobe. Mild, posterior right basilar atelectasis and/or infiltrate is seen. A 1.9 cm x 1.3 cm noncalcified, nonenhancing soft tissue nodule is seen along the infrahilar aspect of the right lower lobe (axial CT images 57 through 62, CT series number 5). There is a small right pleural effusion. No pneumothorax is identified. Upper Abdomen: The liver is cirrhotic in appearance. Surgical clips are noted within the gallbladder fossa. Musculoskeletal: Degenerative changes are seen throughout the thoracic spine. Review of the MIP images confirms the above findings. IMPRESSION: 1. Marked severity right middle lobe and right upper lobe infiltrate. 2. Mild right basilar atelectasis and/or infiltrate. 3. Small right pleural effusion. 4. 1.9 cm x 1.3 cm right infrahilar soft tissue nodule. Consider one of the following in 3 months for both low-risk and high-risk individuals: (a) repeat chest CT, (b) follow-up PET-CT, or (c) tissue sampling. This recommendation follows the consensus statement: Guidelines for Management of Incidental Pulmonary Nodules Detected on CT Images: From the Fleischner Society 2017; Radiology 2017; 284:228-243. 5. Cirrhotic liver. 6. Aortic atherosclerosis. Aortic Atherosclerosis (ICD10-I70.0). Electronically Signed   By: TVirgina NorfolkM.D.   On: 12/09/2020 19:32   DG Chest Port 1 View  Result Date: 12/11/2020 CLINICAL DATA:  Pneumonia EXAM: PORTABLE CHEST  1 VIEW COMPARISON:  12/10/2020 FINDINGS: Focal infiltrate is again seen throughout the right lung, with more focal due consolidation within the right mid lung zone, unchanged from prior examination. Left lung is clear. No pneumothorax or pleural effusion. Cardiac size is within normal limits. Pulmonary vascularity is normal. IMPRESSION: Stable right lung consolidation. Electronically Signed   By: AFidela SalisburyMD   On: 12/11/2020 06:20  DG Chest Port 1 View  Result Date: 12/10/2020 CLINICAL DATA:  Shortness of breath in a patient with pneumonia. EXAM: PORTABLE CHEST 1 VIEW COMPARISON:  Single-view of the chest and CT chest 12/09/2020. FINDINGS: Airspace disease throughout the right chest has worsened with marked increase in the density of airspace disease in the right mid lung zone. The left lung remains clear. Heart size is upper normal. Aortic atherosclerosis. No pneumothorax or pleural fluid. IMPRESSION: Worsened airspace disease in the right chest consistent with progressive pneumonia. Electronically Signed   By: Inge Rise M.D.   On: 12/10/2020 09:43   DG Chest Port 1 View  Result Date: 12/09/2020 CLINICAL DATA:  Short of breath for 3 days, history of asthma and diabetes EXAM: PORTABLE CHEST 1 VIEW COMPARISON:  12/07/2019 FINDINGS: 2 frontal views of the chest demonstrate an unremarkable cardiac silhouette. There is diffuse right-sided airspace disease greatest in the mid and lower lung zones, likely involving the right middle and right lower lobes. No large effusion or pneumothorax. No acute bony abnormalities. IMPRESSION: 1. Significant right-sided airspace disease consistent with right middle and right lower lobe pneumonia. Followup PA and lateral chest X-ray is recommended in 3-4 weeks following trial of antibiotic therapy to ensure resolution and exclude underlying malignancy. Electronically Signed   By: Randa Ngo M.D.   On: 12/09/2020 17:06     LOS: 2 days   Antonieta Pert, MD [facility]  12/11/2020, 7:57 AM

## 2020-12-11 NOTE — Progress Notes (Signed)
RT in to see pt for scheduled neb tx. Upon arrival, Pt had O2 off and was having difficulty putting [place] back on. RT assisted pt, and gave neb tx. Pt states she does not feel like she needs to go on BIPAP at this time. Advised pt to notify for RT if she had worsening SOB, and she would be placed on BIPAP. Pt agrees to wearing if needed. RT will continue to monitor.

## 2020-12-11 NOTE — Evaluation (Signed)
Occupational Therapy Evaluation Patient Details Name: Rachael Buck MRN: [id number] DOB: Aug 19, 1941 Today's Date: 12/11/2020    History of Present Illness 80 y.o. female presents to [facility] ED on 12/09/2020 with reports of SOB and cough. Pt admitted for sepsis 2/2 PNA. On 4/13 pt with worsening consolidation on CXR, pt requiring BiPAP due to worsening respiratory status. PMH includes NASH w/ Cirrhosis, Diabetes, HTN, GERD, HL, Morbid Obesity, Asthma, anxiety disorder.   Clinical Impression   Pt was independent in ADL and ambulation. She and her husband work together on housekeeping and meal prep. Pt drives. Pt presents with decreased activity tolerance, impaired balance and memory deficits. She requires up to min guard assist with RW for mobility and set up to min assist for ADL. Began educating pt in breathing techniques and encouraged use of IS. Will follow acutely.     Follow Up Recommendations  Home health OT    Equipment Recommendations       Recommendations for Other Services       Precautions / Restrictions Precautions Precautions: Fall;Other (comment) Precaution Comments: monitor 02 Restrictions Weight Bearing Restrictions: No      Mobility Bed Mobility Overal bed mobility: Needs Assistance Bed Mobility: Supine to Sit;Sit to Supine     Supine to sit: Supervision;HOB elevated Sit to supine: Supervision   General bed mobility comments: for lines and safety    Transfers Overall transfer level: Needs assistance Equipment used: Rolling walker (2 wheeled) Transfers: Sit to/from Stand Sit to Stand: Min guard         General transfer comment: slow to rise, cues for hand placement with walker use    Balance Overall balance assessment: Needs assistance Sitting-balance support: No upper extremity supported;Feet supported Sitting balance-Leahy Scale: Good     Standing balance support: Single extremity supported;Bilateral upper extremity supported Standing  balance-Leahy Scale: Poor Standing balance comment: reliant on UE support for dynamic balance, fair statically at sink                           ADL either performed or assessed with clinical judgement   ADL Overall ADL's : Needs assistance/impaired Eating/Feeding: Independent;Bed level Eating/Feeding Details (indicate cue type and reason): ice chips Grooming: Wash/dry hands;Standing;Min guard   Upper Body Bathing: Minimal assistance;Sitting   Lower Body Bathing: Minimal assistance;Sit to/from stand   Upper Body Dressing : Set up;Sitting   Lower Body Dressing: Minimal assistance;Sit to/from stand   Toilet Transfer: Min guard;RW;BSC;Ambulation   Toileting- Water quality scientist and Hygiene: Minimal assistance;Sitting/lateral lean       Functional mobility during ADLs: Min guard;Rolling walker General ADL Comments: pt with decreased activity tolerance, currently on 5L 02     Vision Patient Visual Report: No change from baseline       Perception     Praxis      Pertinent Vitals/Pain Pain Assessment: No/denies pain     Hand Dominance Right   Extremity/Trunk Assessment Upper Extremity Assessment Upper Extremity Assessment: Overall WFL for tasks assessed   Lower Extremity Assessment Lower Extremity Assessment: Generalized weakness   Cervical / Trunk Assessment Cervical / Trunk Assessment: Kyphotic   Communication Communication Communication: No difficulties   Cognition Arousal/Alertness: Awake/alert Behavior During Therapy: WFL for tasks assessed/performed Overall Cognitive Status: Impaired/Different from baseline Area of Impairment: Memory                     Memory: Decreased short-term memory  General Comments: conversation is hard to follow at times, forgets what she is trying to say   General Comments  pt on 5L [place] at rest with sats in high 90s. PT places pt on 6L [place] for mobility with maintained sats in high 90s, however pt  reports 8/10 DOE with ambulation and fatigues quickly.    Exercises     Shoulder Instructions      Home Living Family/patient expects to be discharged to:: Private residence Living Arrangements: Spouse/significant other Available Help at Discharge: Family;Available 24 hours/day Type of Home: House Home Access: Stairs to enter [employer] of Steps: 4 Entrance Stairs-Rails: Right Home Layout: One level     Bathroom Shower/Tub: Occupational psychologist: Handicapped height     Home Equipment: Environmental consultant - 2 wheels;Cane - single point;Wheelchair - manual;Shower seat;Grab bars - toilet;Grab bars - tub/shower          Prior Functioning/Environment Level of Independence: Independent        Comments: driving, works together with her husband on meal prep and housekeeping        OT Problem List: Decreased activity tolerance;Decreased strength;Impaired balance (sitting and/or standing);Decreased knowledge of use of DME or AE;Cardiopulmonary status limiting activity;Obesity;Decreased cognition      OT Treatment/Interventions: Self-care/ADL training;Energy conservation;DME and/or AE instruction;Therapeutic activities;Cognitive remediation/compensation;Patient/family education;Balance training    OT Goals(Current goals can be found in the care plan section) Acute Rehab OT Goals Patient Stated Goal: to return to independent mobility OT Goal Formulation: With patient Time For Goal Achievement: 12/25/20 Potential to Achieve Goals: Good ADL Goals Pt Will Perform Grooming: with modified independence;standing Pt Will Perform Lower Body Bathing: with modified independence;sit to/from stand Pt Will Transfer to Toilet: with modified independence;ambulating Pt Will Perform Toileting - Clothing Manipulation and hygiene: with modified independence;sit to/from stand Additional ADL Goal #1: Pt will state at least 3 energy conservation strategies as instructed.  OT Frequency:  Min 2X/week   Barriers to D/C:            Co-evaluation              AM-PAC OT "6 Clicks" Daily Activity     Outcome Measure Help from another person eating meals?: None Help from another person taking care of personal grooming?: A Little Help from another person toileting, which includes using toliet, bedpan, or urinal?: A Little Help from another person bathing (including washing, rinsing, drying)?: A Little Help from another person to put on and taking off regular upper body clothing?: A Little Help from another person to put on and taking off regular lower body clothing?: A Little 6 Click Score: 19   End of Session Equipment Utilized During Treatment: Rolling walker;Oxygen  Activity Tolerance: Patient tolerated treatment well Patient left: in bed;with call bell/phone within reach;with bed alarm set  OT Visit Diagnosis: Unsteadiness on feet (R26.81);Other abnormalities of gait and mobility (R26.89);Muscle weakness (generalized) (M62.81) (decreased activity tolerance)                Time: 8469-6295 OT Time Calculation (min): 21 min Charges:  OT General Charges $OT Visit: 1 Visit OT Evaluation $OT Eval Moderate Complexity: 1 Mod  Nestor Lewandowsky, OTR/L Acute Rehabilitation Services Pager: [phone] Office: [phone]  Malka So 12/11/2020, 3:57 PM

## 2020-12-12 DIAGNOSIS — J181 Lobar pneumonia, unspecified organism: Secondary | ICD-10-CM | POA: Diagnosis not present

## 2020-12-12 DIAGNOSIS — R911 Solitary pulmonary nodule: Secondary | ICD-10-CM | POA: Diagnosis not present

## 2020-12-12 LAB — GLUCOSE, CAPILLARY
Glucose-Capillary: 86 mg/dL (ref 70–99)
Glucose-Capillary: 154 mg/dL — ABNORMAL HIGH (ref 70–99)
Glucose-Capillary: 114 mg/dL — ABNORMAL HIGH (ref 70–99)
Glucose-Capillary: 119 mg/dL — ABNORMAL HIGH (ref 70–99)

## 2020-12-12 LAB — CBC
HCT: 28.9 % — ABNORMAL LOW (ref 36.0–46.0)
Hemoglobin: 9.3 g/dL — ABNORMAL LOW (ref 12.0–15.0)
MCH: 27 pg (ref 26.0–34.0)
MCHC: 32.2 g/dL (ref 30.0–36.0)
MCV: 84 fL (ref 80.0–100.0)
Platelets: 52 10*3/uL — ABNORMAL LOW (ref 150–400)
RBC: 3.44 MIL/uL — ABNORMAL LOW (ref 3.87–5.11)
RDW: 18.7 % — ABNORMAL HIGH (ref 11.5–15.5)
WBC: 12.2 10*3/uL — ABNORMAL HIGH (ref 4.0–10.5)
nRBC: 0 % (ref 0.0–0.2)

## 2020-12-12 LAB — BASIC METABOLIC PANEL WITH GFR
Anion gap: 8 (ref 5–15)
BUN: 19 mg/dL (ref 8–23)
CO2: 23 mmol/L (ref 22–32)
Calcium: 8.2 mg/dL — ABNORMAL LOW (ref 8.9–10.3)
Chloride: 105 mmol/L (ref 98–111)
Creatinine, Ser: 0.63 mg/dL (ref 0.44–1.00)
GFR, Estimated: >60 mL/min
Glucose, Bld: 85 mg/dL (ref 70–99)
Potassium: 3.6 mmol/L (ref 3.5–5.1)
Sodium: 136 mmol/L (ref 135–145)

## 2020-12-12 LAB — PROCALCITONIN: Procalcitonin: 11.48 ng/mL

## 2020-12-12 NOTE — Progress Notes (Signed)
PROGRESS NOTE    Rachael Buck  DJM:[id number] DOB: 09-24-1940 DOA: 12/09/2020 PCP: [name], MD   Chief Complaint  Patient presents with  . Shortness of Breath  Brief Narrative: 80 year old female with history of NASH/cirrhosis, diabetes, HTN, GERD, HLD, morbid obesity, asthma, anxiety disorder presented with progressive shortness of breath, cough for few days. In the ED low-grade temp nine 9.7 tachycardic oxygen 90% room air with significant leukocytosis elevated procalcitonin, hypokalemia and thrombocytopenia. 80 y.o. female with medical history significant of NASH cirrhosis, diabetes, hypertension, GERD, hyperlipidemia, morbid obesity, asthma and anxiety disorder who presented to the ER with progressive shortness of breath and cough for a few days, Lactic acidosis 7.4> 5.7. CT angiogram of the chest showed no PE but right middle lobe and right upper lobe infiltrates.  There is mild right basilar atelectasis on or infiltrate.  Small right pleural effusion.  Patient also has a 1.9 x 1.2 cm right infrahilar soft tissue nodule as well as evidence of cirrhotic liver.  Given IV fluids boluses 2 L, IV Solu-Medrol and was admitted.   Subjective: Seen and examined this morning.  She was resting in the bedside chair on nasal cannula. She needed BiPAP overnight was anxious and hard work of breathing. Reports some confusion yesterday-but resolved now.  Assessment & Plan:  Severe sepsis POA-with leukocytosis/lactic acidosis due to pneumonia Severe right-sided [facility] pneumonia right upper lobe and right middle lobe Initially admitted with ceftriaxone azithromycin but had worsening respiratory status- abx changed-  Leukocytosis downtrending Pro-Cal improving.  Continue on Zosyn/azithromycin MRSA PCR negative so off vancomycin.  Continue BiPAP pulmonary support as per PCCM team.  Overnight again needing BiPAP. Blood culture no growth so far. Srep and Legionella antigen are negative.   Pulmonary advised for 10 days course of antibiotics Recent Labs  Lab 12/09/20 1638 12/09/20 1714 12/09/20 1914 12/10/20 0241 12/10/20 1125 12/10/20 1836 12/11/20 0255 12/12/20 0350  WBC 28.9*  --   --  35.0*  --   --  15.6* 12.2*  LATICACIDVEN  --  7.4* 5.7*  --  3.7* 2.6*  --   --   PROCALCITON  --   --   --  43.89  --   --  24.00 11.48   Acute hypoxic and hypercapnic respiratory failure placed on BiPAP by pulmonary, again had work of breathing placed on BiPAP last night.Continue as per pulmonary wean to [place] oxygen as tolerated, normally not on oxygen at home.Oxygen weaned down from 4 to 3 L.  Mild acute metabolic encephalopathy/confusion likely from hypoxia and hypercapnia and sepsis.  Resolved this morning.  Uncontrolled diabetes mellitus, controlled HbA1c 6.7, a month ago.  Sugar well controlled on sliding scale insulin, jardiance and oral glipizide. Recent Labs  Lab 12/11/20 0832 12/11/20 1129 12/11/20 1629 12/11/20 2100 12/12/20 0753  GLUCAP 99 107* 83 115* 86   Hypokalemia:Resolved  Hypomagnesemia-continue oral magnesium oxide   Acute renal failure creatinine elevated 1.2 on admission improved to 0.6.  Hold off IV fluids Urine retention needing in and out cath overnight 4/14- monitor urine output  Intake/Output Summary (Last 24 hours) at 12/12/2020 0817 Last data filed at 12/12/2020 0600 Gross per 24 hour  Intake 90.33 ml  Output 600 ml  Net -509.67 ml   HTN: BP is controlled. Not neeeding meds  NASH w/ cirrhosis of liver: She reports she follows up with Dr. Marin Olp.  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  Likely due to  acute illness. Monitor-I have stopped Lovenox for now 4/14 Recent Labs  Lab 12/09/20 1638 12/10/20 0241 12/11/20 0255 12/12/20 0350  PLT 82* 68* 58* 26*   Incidentally noted abnormal CT chest with  "1.9 cm x 1.3 cm noncalcified, nonenhancing soft tissue nodule is seen along the infrahilar aspect  of the right lower lobe": Per recommendation the family and will follow up with PCP/Dr. Marin Olp Lung nodule outpatient follow-up  Morbid obesity BMI more than 30 will benefit with weight loss PCP follow-up, as well as sleep apnea evaluation. Diet Order            Diet clear liquid Room service appropriate? Yes; Fluid consistency: Thin  Diet effective now               Patient's Body mass index is 31.06 kg/m.  DVT prophylaxis: Place and maintain sequential compression device Start: 12/11/20 0809- hold and change to scd 2/2 low platelets Code Status:   Code Status: Full Code  Family Communication: plan of care discussed with patient at bedside. Discussed with nursing staff.    Status is: Inpatient Remains inpatient appropriate because:IV treatments appropriate due to intensity of illness or inability to take PO and Inpatient level of care appropriate due to severity of illness for ongoing management of respiratory failure sepsis.  Dispo: The patient is from: Home              Anticipated d/c is to: TBD.  cont PT OT  eval.              Patient currently is not medically stable to d/c.   Difficult to place patient No  Unresulted Labs (From admission, onward)          Start     Ordered   12/11/20 9563  Basic metabolic panel  Daily,   R     Question:  Specimen collection method  Answer:  Lab=Lab collect   12/10/20 1121   12/11/20 0500  CBC  Daily,   R     Question:  Specimen collection method  Answer:  Lab=Lab collect   12/10/20 1121         Medications reviewed:  Scheduled Meds: . atorvastatin  40 mg Oral Daily  . budesonide (PULMICORT) nebulizer solution  0.5 mg Nebulization BID  . colestipol  1 g Oral Daily  . dicyclomine  20 mg Oral TID AC & HS  . empagliflozin  10 mg Oral Daily  . glipiZIDE  5 mg Oral Q breakfast  . guaiFENesin  600 mg Oral BID  . hydrocortisone cream   Topical BID  . insulin aspart  0-15 Units Subcutaneous TID WC  . insulin aspart  0-5 Units  Subcutaneous QHS  . ipratropium-albuterol  3 mL Nebulization Q6H  . magnesium oxide  400 mg Oral BID  . mouth rinse  15 mL Mouth Rinse BID  . pantoprazole  40 mg Oral Daily  . sertraline  100 mg Oral Daily   Continuous Infusions: . azithromycin 500 mg (12/11/20 1807)  . piperacillin-tazobactam (ZOSYN)  IV 3.375 g (12/12/20 0543)    Consultants:see note  Procedures:see note  Antimicrobials: Anti-infectives (From admission, onward)   Start     Dose/Rate Route Frequency Ordered Stop   12/11/20 1500  vancomycin (VANCOREADY) IVPB 1500 mg/300 mL  Status:  Discontinued        1,500 mg 150 mL/hr over 120 Minutes Intravenous Every 24 hours 12/10/20 1211 12/11/20 0825   12/10/20 2000  piperacillin-tazobactam (ZOSYN)  IVPB 3.375 g        3.375 g 12.5 mL/hr over 240 Minutes Intravenous Every 8 hours 12/10/20 1211     12/10/20 1800  cefTRIAXone (ROCEPHIN) 2 g in sodium chloride 0.9 % 100 mL IVPB  Status:  Discontinued        2 g 200 mL/hr over 30 Minutes Intravenous Every 24 hours 12/09/20 2113 12/10/20 1210   12/10/20 1800  azithromycin (ZITHROMAX) 500 mg in sodium chloride 0.9 % 250 mL IVPB  Status:  Discontinued        500 mg 250 mL/hr over 60 Minutes Intravenous Every 24 hours 12/09/20 2113 12/10/20 1210   12/10/20 1800  azithromycin (ZITHROMAX) 500 mg in sodium chloride 0.9 % 250 mL IVPB        500 mg 250 mL/hr over 60 Minutes Intravenous Every 24 hours 12/10/20 1212     12/10/20 1500  vancomycin (VANCOREADY) IVPB 1750 mg/350 mL        1,750 mg 175 mL/hr over 120 Minutes Intravenous Once 12/10/20 1409 12/10/20 1646   12/10/20 1300  vancomycin (VANCOCIN) 1,750 mg in sodium chloride 0.9 % 500 mL IVPB  Status:  Discontinued        1,750 mg 250 mL/hr over 120 Minutes Intravenous  Once 12/10/20 1211 12/10/20 1500   12/10/20 1300  piperacillin-tazobactam (ZOSYN) IVPB 3.375 g        3.375 g 100 mL/hr over 30 Minutes Intravenous  Once 12/10/20 1211 12/11/20 0840   12/09/20 1715  cefTRIAXone  (ROCEPHIN) 1 g in sodium chloride 0.9 % 100 mL IVPB        1 g 200 mL/hr over 30 Minutes Intravenous  Once 12/09/20 1714 12/09/20 1821   12/09/20 1715  azithromycin (ZITHROMAX) 500 mg in sodium chloride 0.9 % 250 mL IVPB        500 mg 250 mL/hr over 60 Minutes Intravenous  Once 12/09/20 1714 12/09/20 1921     Culture/Microbiology    Component Value Date/Time   SDES BLOOD RIGHT WRIST 12/09/2020 2145   SPECREQUEST  12/09/2020 2145    BOTTLES DRAWN AEROBIC AND ANAEROBIC Blood Culture adequate volume   CULT  12/09/2020 2145    NO GROWTH 2 DAYS Performed at [facility], [place] [address], [place], [place] [address]    REPTSTATUS PENDING 12/09/2020 2145    Other culture-see note  Objective: Vitals: Today's Vitals   12/12/20 0732 12/12/20 0743 12/12/20 0754 12/12/20 0806  BP:   (!) 150/67   Pulse:   86   Resp:   20   Temp:      TempSrc:   Oral   SpO2:  99% 100% 97%  Weight:      Height:      PainSc: 0-No pain       Intake/Output Summary (Last 24 hours) at 12/12/2020 0817 Last data filed at 12/12/2020 0600 Gross per 24 hour  Intake 90.33 ml  Output 600 ml  Net -509.67 ml   Filed Weights   12/10/20 0342 12/11/20 0324 12/12/20 0503  Weight: 87.8 kg 88.5 kg 87.3 kg   Weight change: -1.2 kg  Intake/Output from previous day: 04/14 0701 - 04/15 0700 In: 90.3 [IV Piggyback:90.3] Out: 600 [Urine:400; Stool:200] Intake/Output this shift: No intake/output data recorded. Filed Weights   12/10/20 0342 12/11/20 0324 12/12/20 0503  Weight: 87.8 kg 88.5 kg 87.3 kg    Examination: General exam: AAOx3, obese, pleasant, NAD, weak appearing. HEENT:Oral mucosa moist, Ear/Nose WNL grossly, dentition normal.  Respiratory system: bilaterally air entry present no use of accessory respiratory muscle Cardiovascular system: S1 & S2 +, No JVD,. Gastrointestinal system: Abdomen soft, NT,ND, BS+ Nervous System:Alert, awake, moving extremities and grossly nonfocal Extremities: No edema,  distal peripheral pulses palpable.  Skin:No rashes,no icterus. ZOX:WRUEAV muscle bulk,tone, power.  Data Reviewed: I have personally reviewed following labs and imaging studies CBC: Recent Labs  Lab 12/09/20 1638 12/09/20 1720 12/09/20 1816 12/10/20 0241 12/11/20 0255 12/12/20 0350  WBC 28.9*  --   --  35.0* 15.6* 12.2*  NEUTROABS 25.2*  --   --   --   --   --   HGB 12.3 12.6 12.6 11.5* 9.5* 9.3*  HCT 38.2 37.0 37.0 33.9* 29.1* 28.9*  MCV 84.1  --   --  83.5 84.1 84.0  PLT 82*  --   --  68* 58* 52*   Basic Metabolic Panel: Recent Labs  Lab 12/09/20 1638 12/09/20 1720 12/09/20 1816 12/10/20 0241 12/11/20 0255 12/11/20 1436 12/12/20 0350  NA 136 138 138 136 138  --  136  K 2.3* 2.2* 2.4* 3.2* 2.9* 4.3 3.6  CL 99  --  98 100 103  --  105  CO2 21*  --   --  25 26  --  23  GLUCOSE 129*  --  124* 168* 105*  --  85  BUN 16  --  18 17 19   --  19  CREATININE 1.25*  --  1.00 0.94 0.73  --  0.63  CALCIUM 8.4*  --   --  8.3* 8.3*  --  8.2*  MG 1.2*  --   --  1.8  --   --   --    GFR: Estimated Creatinine Clearance: 64.5 mL/min (by C-G formula based on SCr of 0.63 mg/dL). Liver Function Tests: Recent Labs  Lab 12/09/20 1638 12/10/20 0241  AST 34 30  ALT 14 18  ALKPHOS 51 43  BILITOT 1.8* 1.8*  PROT 6.2* 6.6  ALBUMIN 3.2* 3.3*   No results for input(s): LIPASE, AMYLASE in the last 168 hours. No results for input(s): AMMONIA in the last 168 hours. Coagulation Profile: Recent Labs  Lab 12/10/20 0241  INR 1.6*   Cardiac Enzymes: No results for input(s): CKTOTAL, CKMB, CKMBINDEX, TROPONINI in the last 168 hours. BNP (last 3 results) No results for input(s): PROBNP in the last 8760 hours. HbA1C: No results for input(s): HGBA1C in the last 72 hours. CBG: Recent Labs  Lab 12/11/20 0832 12/11/20 1129 12/11/20 1629 12/11/20 2100 12/12/20 0753  GLUCAP 99 107* 83 115* 86   Lipid Profile: No results for input(s): CHOL, HDL, LDLCALC, TRIG, CHOLHDL, LDLDIRECT in  the last 72 hours. Thyroid Function Tests: No results for input(s): TSH, T4TOTAL, FREET4, T3FREE, THYROIDAB in the last 72 hours. Anemia Panel: No results for input(s): VITAMINB12, FOLATE, FERRITIN, TIBC, IRON, RETICCTPCT in the last 72 hours. Sepsis Labs: Recent Labs  Lab 12/09/20 1714 12/09/20 1914 12/10/20 0241 12/10/20 1125 12/10/20 1836 12/11/20 0255 12/12/20 0350  PROCALCITON  --   --  43.89  --   --  24.00 11.48  LATICACIDVEN 7.4* 5.7*  --  3.7* 2.6*  --   --     Recent Results (from the past 240 hour(s))  Resp Panel by RT-PCR (Flu A&B, Covid) Nasopharyngeal Swab     Status: None   Collection Time: 12/09/20  5:15 PM   Specimen: Nasopharyngeal Swab; Nasopharyngeal(NP) swabs in vial transport medium  Result Value Ref Range Status  SARS Coronavirus 2 by RT PCR NEGATIVE NEGATIVE Final    Comment: (NOTE) SARS-CoV-2 target nucleic acids are NOT DETECTED.  The SARS-CoV-2 RNA is generally detectable in upper respiratory specimens during the acute phase of infection. The lowest concentration of SARS-CoV-2 viral copies this assay can detect is 138 copies/mL. A negative result does not preclude SARS-Cov-2 infection and should not be used as the sole basis for treatment or other patient management decisions. A negative result may occur with  improper specimen collection/handling, submission of specimen other than nasopharyngeal swab, presence of viral mutation(s) within the areas targeted by this assay, and inadequate number of viral copies(<138 copies/mL). A negative result must be combined with clinical observations, patient history, and epidemiological information. The expected result is Negative.  Fact Sheet for Patients:  EntrepreneurPulse.com.au  Fact Sheet for Healthcare Providers:  IncredibleEmployment.be  This test is no t yet approved or cleared by the [place] FDA and  has been authorized for detection and/or diagnosis of  SARS-CoV-2 by FDA under an Emergency Use Authorization (EUA). This EUA will remain  in effect (meaning this test can be used) for the duration of the COVID-19 declaration under Section 564(b)(1) of the Act, 21 U.S.C.section 360bbb-3(b)(1), unless the authorization is terminated  or revoked sooner.       Influenza A by PCR NEGATIVE NEGATIVE Final   Influenza B by PCR NEGATIVE NEGATIVE Final    Comment: (NOTE) The Xpert Xpress SARS-CoV-2/FLU/RSV plus assay is intended as an aid in the diagnosis of influenza from Nasopharyngeal swab specimens and should not be used as a sole basis for treatment. Nasal washings and aspirates are unacceptable for Xpert Xpress SARS-CoV-2/FLU/RSV testing.  Fact Sheet for Patients: EntrepreneurPulse.com.au  Fact Sheet for Healthcare Providers: IncredibleEmployment.be  This test is not yet approved or cleared by the [place] FDA and has been authorized for detection and/or diagnosis of SARS-CoV-2 by FDA under an Emergency Use Authorization (EUA). This EUA will remain in effect (meaning this test can be used) for the duration of the COVID-19 declaration under Section 564(b)(1) of the Act, 21 U.S.C. section 360bbb-3(b)(1), unless the authorization is terminated or revoked.  Performed at [facility], [place] [address], [place], [place] [address]   Blood culture (routine x 2)     Status: None (Preliminary result)   Collection Time: 12/09/20  5:36 PM   Specimen: BLOOD  Result Value Ref Range Status   Specimen Description BLOOD SITE NOT SPECIFIED  Final   Special Requests   Final    BOTTLES DRAWN AEROBIC AND ANAEROBIC Blood Culture adequate volume   Culture   Final    NO GROWTH 2 DAYS Performed at [facility], [address] [place], [place] [address]    Report Status PENDING  Incomplete  Blood culture (routine x 2)     Status: None (Preliminary result)   Collection Time: 12/09/20  9:45 PM    Specimen: BLOOD RIGHT WRIST  Result Value Ref Range Status   Specimen Description BLOOD RIGHT WRIST  Final   Special Requests   Final    BOTTLES DRAWN AEROBIC AND ANAEROBIC Blood Culture adequate volume   Culture   Final    NO GROWTH 2 DAYS Performed at [facility], [place] [address] [place], [place] [address]    Report Status PENDING  Incomplete  MRSA PCR Screening     Status: None   Collection Time: 12/10/20 11:26 AM   Specimen: Nasal Mucosa; Nasopharyngeal  Result  Value Ref Range Status   MRSA by PCR NEGATIVE NEGATIVE Final    Comment:        The GeneXpert MRSA Assay (FDA approved for NASAL specimens only), is one component of a comprehensive MRSA colonization surveillance program. It is not intended to diagnose MRSA infection nor to guide or monitor treatment for MRSA infections. Performed at [facility], [place] [address], [place], [place] [address]      Radiology Studies: DG Chest Port 1 View  Result Date: 12/11/2020 CLINICAL DATA:  Pneumonia EXAM: PORTABLE CHEST 1 VIEW COMPARISON:  12/10/2020 FINDINGS: Focal infiltrate is again seen throughout the right lung, with more focal due consolidation within the right mid lung zone, unchanged from prior examination. Left lung is clear. No pneumothorax or pleural effusion. Cardiac size is within normal limits. Pulmonary vascularity is normal. IMPRESSION: Stable right lung consolidation. Electronically Signed   By: Fidela Salisbury MD   On: 12/11/2020 06:20   DG Chest Port 1 View  Result Date: 12/10/2020 CLINICAL DATA:  Shortness of breath in a patient with pneumonia. EXAM: PORTABLE CHEST 1 VIEW COMPARISON:  Single-view of the chest and CT chest 12/09/2020. FINDINGS: Airspace disease throughout the right chest has worsened with marked increase in the density of airspace disease in the right mid lung zone. The left lung remains clear. Heart size is upper normal. Aortic atherosclerosis. No pneumothorax or pleural fluid.  IMPRESSION: Worsened airspace disease in the right chest consistent with progressive pneumonia. Electronically Signed   By: Inge Rise M.D.   On: 12/10/2020 09:43     LOS: 3 days   Antonieta Pert, MD [facility]  12/12/2020, 8:17 AM

## 2020-12-12 NOTE — Progress Notes (Signed)
Pt voicing that she is becoming more confused, and pt is obviously SOB with increased WOB. Pt placed on BIPAP at this time, and appears to be more comfortable. RT will continue to monitor.

## 2020-12-12 NOTE — Progress Notes (Signed)
NAME:  Rachael Buck, MRN:  [id number], DOB:  01-12-1941, LOS: 3 ADMISSION DATE:  12/09/2020, CONSULTATION DATE:  4/13 REFERRING MD:  Lupita Leash, CHIEF COMPLAINT:  Abnormal CXR (worsening)   History of Present Illness:  This is a 80 year old female w/ hx as outlined below. Presented to the ER on 4/12 w/ cc: several day h/o sore throat, sinus pressure, post nasal gtt then 1 d h/o marked fatigue and malaise, increasing shortness of breath & cough. Dyspnea progressed to resting SOB. Did not report fever but was diaphoretic.  On arrival low gd temp 99.7, room air sats 90%, LA 5.7 w/ inc'd RR. WBC ct 28.9. CT imaging to further eval CXR showed fairly sig RUL and RML airspace disease w/ marked area of consolidations, small right effusion and 1.9 x 1.3 cm soft tissue nodule right infrahilar best noted . She was started on supplemental oxygen and antibiotics. On 4/13 feeling better but CXR worse so Pulm asked to see.   Pertinent  Medical History  NASH w/ Cirrhosis, Diabetes, HTN, GERD, HL, Morbid Obesity, Asthma, anxiety disorder.   Significant Hospital Events: Including procedures, antibiotic start and stop dates in addition to other pertinent events   . 4/12 admitted w/ working dx of CAP. CT chest RUL and RML airspace disease w/ marked area of consolidations, small right effusion and 1.9 x 1.3 cm soft tissue nodule right infrahilar best noted on series 5 images 57-62. Azithro and Ceftriaxone started. Resp viral panel was negative.  . 4/13 procalcitonin 43.9, wbc ct 28.9 to 35  (but had received 157m steroids 4/12). Fever curve not much changed. CXR w/ worsening consolidation and overall worsening of right sided airspace disease. Because of this PCCM consulted. ABX changed to Vancomycin, zosyn and azithromycin.  MRSA neg, clearing lactate . 4/14 required BiPAP overnight, delirium  Interim History / Subjective:   Afebrile last 24 hours. Placed on BiPAP at 2:30 AM due to confusion and increased work of  breathing per RN note This morning on 5 L nasal cannula  Objective   Blood pressure (!) 150/67, pulse 86, temperature 98.3 F (36.8 C), temperature source Oral, resp. rate 20, height 5' 6"  (1.676 m), weight 87.3 kg, SpO2 97 %.    FiO2 (%):  [30 %] 30 %   Intake/Output Summary (Last 24 hours) at 12/12/2020 0818 Last data filed at 12/12/2020 0600 Gross per 24 hour  Intake 90.33 ml  Output 600 ml  Net -509.67 ml   Filed Weights   12/10/20 0342 12/11/20 0324 12/12/20 0503  Weight: 87.8 kg 88.5 kg 87.3 kg    Examination: General:  Pleasant elderly female lying in bed, no distress HEENT: full face NIV mask  Neuro: Awake, f/c, MAE CV: S1-S2 regular PULM: No accessory muscle use, able to speak in full sentences, decreased breath sounds on right GI: obese, soft, bs active  Extremities: warm/dry, no LE edema  Skin: no rashes     Labs/imaging that I havepersonally reviewed  (right click and "Reselect all SmartList Selections" daily)  4/12 chest CT  RUL and RML airspace disease w/ marked area of consolidations, small right effusion and 1.9 x 1.3 cm soft tissue nodule right infrahilar   Labs show mild hypokalemia, decreasing procalcitonin, decreasing leukocytosis, stable anemia and thrombocytopenia  Chest x-ray 4/14 independently reviewed which shows extensive consolidation in the right without evidence of pleural effusion  Culture data- BCx 4/12 ngtd, MRSA neg  Resolved Hospital Problem list     Assessment & Plan:  Dyspnea  CAP involving mostly RM and RLL Severe Sepsis 2/2 CAP  Lactic acidosis  H/o allergic rhinitis and seasonal allergies Right perihilar lung nodule H/o reactive airway disease/asthma (no PFTs dx'd in 11s. Only an issue typically during allergy season) Leukocytosis  H/o cirrhosis in context of NASH H/o diabetes H/o HTN H/o Obesity  H/o anxiety  DNR status, changed back to full code 4/13, see PB note   Pulmonary problem list    Acute  hypoxic/hypercarbic respiratory failure   -I was able to drop her to 3 L on nasal cannula at bedside. -Change BiPAP to as needed only for work of breathing , and optimistic she will be able to tolerate off BiPAP over the weekend   Severe CAP- RML/ RLL - d/c vanc with negative MRSA, continue zosyn/ azithro for now -As a throat can be discontinued after 5 days, plan for total antibiotics 10 days at least  COPD PFTs 2011 shows airway obstruction with ratio 59 and FEV1 67% postbronchodilator - continue scheduled BD and ICS , can switch to Symbicort on discharge     Sepsis 2/2 CAP w/ lactic acidosis  Resolved  Lung Nodule: Prior smoker Plan Outpatient f/u has been arranged 5/12 She will need 3 month repeat imaging     ?acute delirium, likely related to sepsis +/- steroids - no indication for acute hypercarbic failure  -PT daily and advance diet  She was able to connect me to her daughter Juliann Pulse whom I updated in detail  PCCM will be available as needed  Kara Mead MD. Shade Flood. [facility] Pager : [phone]  If no response to pager , please call [phone] until 7 pm After 7:00 pm call Elink  [phone]     12/12/2020, 8:18 AM

## 2020-12-12 NOTE — Progress Notes (Signed)
Physical Therapy Treatment Patient Details Name: Rachael Buck MRN: [id number] DOB: 1940/11/27 Today's Date: 12/12/2020    History of Present Illness 80 y.o. female presents to [facility] ED on 12/09/2020 with reports of SOB and cough. Pt admitted for sepsis 2/2 PNA. On 4/13 pt with worsening consolidation on CXR, pt requiring BiPAP due to worsening respiratory status. PMH includes NASH w/ Cirrhosis, Diabetes, HTN, GERD, HL, Morbid Obesity, Asthma, anxiety disorder.    PT Comments    Pt limited by fatigue this session but is able to ambulate for similar distances on less supplemental oxygen. Pt appears to remain confused with some impairments in memory, reporting either a dream or hallucination that occurred earlier in the morning. Pt's activity tolerance is the greatest limiting factor to her independence at this time. Pt will benefit from aggressive mobilization and acute PT services to improve activity tolerance and restore independence. PT recommends discharge home with HHPT services at this time.   Follow Up Recommendations  Home health PT     Equipment Recommendations  None recommended by PT    Recommendations for Other Services       Precautions / Restrictions Precautions Precautions: Fall Precaution Comments: monitor 02 Restrictions Weight Bearing Restrictions: No    Mobility  Bed Mobility Overal bed mobility: Needs Assistance Bed Mobility: Supine to Sit;Sit to Supine     Supine to sit: Supervision;HOB elevated Sit to supine: Supervision   General bed mobility comments: for lines and safety    Transfers Overall transfer level: Needs assistance Equipment used: Rolling walker (2 wheeled) Transfers: Sit to/from Stand Sit to Stand: Min guard         General transfer comment: verbal cues for hand placement. Pt performs 1 sit to stand with initial mobility and then 5 time sit to stand at end of session  Ambulation/Gait Ambulation/Gait assistance: Min guard Gait  Distance (Feet): 60 Feet Assistive device: Rolling walker (2 wheeled) Gait Pattern/deviations: Step-through pattern Gait velocity: reduced Gait velocity interpretation: <1.8 ft/sec, indicate of risk for recurrent falls General Gait Details: pt with slowed step-through gait, reduced stride length   Stairs             Wheelchair Mobility    Modified Rankin (Stroke Patients Only)       Balance Overall balance assessment: Needs assistance Sitting-balance support: No upper extremity supported;Feet supported Sitting balance-Leahy Scale: Good     Standing balance support: Single extremity supported Standing balance-Leahy Scale: Poor Standing balance comment: reliant on UE support                            Cognition Arousal/Alertness: Awake/alert Behavior During Therapy: WFL for tasks assessed/performed Overall Cognitive Status: Impaired/Different from baseline Area of Impairment: Memory                     Memory: Decreased short-term memory         General Comments: reports confusion upon waking this morning, thought she was at home      Exercises      General Comments General comments (skin integrity, edema, etc.): pt on 3.5 L [place] upon arrival, mobilizing on 4L [place] with sats from 90-95% this session. PT encourages pursed lip breathing      Pertinent Vitals/Pain Pain Assessment: No/denies pain    Home Living                      Prior  Function            PT Goals (current goals can now be found in the care plan section) Acute Rehab PT Goals Patient Stated Goal: to return to independence Progress towards PT goals: Progressing toward goals    Frequency    Min 3X/week      PT Plan Current plan remains appropriate    Co-evaluation              AM-PAC PT "6 Clicks" Mobility   Outcome Measure  Help needed turning from your back to your side while in a flat bed without using bedrails?: A Little Help needed  moving from lying on your back to sitting on the side of a flat bed without using bedrails?: A Little Help needed moving to and from a bed to a chair (including a wheelchair)?: A Little Help needed standing up from a chair using your arms (e.g., wheelchair or bedside chair)?: A Little Help needed to walk in hospital room?: A Little Help needed climbing 3-5 steps with a railing? : A Little 6 Click Score: 18    End of Session Equipment Utilized During Treatment: Oxygen Activity Tolerance: Patient limited by fatigue Patient left: in bed;with call bell/phone within reach;with bed alarm set Nurse Communication: Mobility status PT Visit Diagnosis: Other abnormalities of gait and mobility (R26.89);Muscle weakness (generalized) (M62.81)     Time: 4128-2081 PT Time Calculation (min) (ACUTE ONLY): 18 min  Charges:  $Gait Training: 8-22 mins                     Zenaida Niece, PT, DPT Acute Rehabilitation Pager: [phone]    Zenaida Niece 12/12/2020, 4:40 PM

## 2020-12-12 NOTE — Care Management Important Message (Signed)
Important Message  Patient Details  Name: Rachael Buck MRN: [id number] Date of Birth: 07/26/1941   Medicare Important Message Given:  Yes - Important Message mailed due to current National Emergency  Verbal consent obtained due to current National Emergency  Relationship to patient: Self Contact Name: Kaesha Call Date: 12/12/20  Time: 1400 Phone: [phone] Outcome: No Answer/Busy Important Message mailed to: Patient address on file    Delorse Lek 12/12/2020, 2:00 PM

## 2020-12-12 NOTE — Progress Notes (Signed)
Occupational Therapy Treatment Patient Details Name: Rachael Buck MRN: [id number] DOB: 10-01-1940 Today's Date: 12/12/2020    History of present illness 80 y.o. female presents to [facility] ED on 12/09/2020 with reports of SOB and cough. Pt admitted for sepsis 2/2 PNA. On 4/13 pt with worsening consolidation on CXR, pt requiring BiPAP due to worsening respiratory status. PMH includes NASH w/ Cirrhosis, Diabetes, HTN, GERD, HL, Morbid Obesity, Asthma, anxiety disorder.   OT comments  Pt reports having sat up in the chair for one hour this morning. Participated in toileting, standing grooming and mobilizing around her room with min guard assist and RW. Pt on 2L 02, but difficulty getting and accurate reading during exertion. Once returned to bed, pt with Sp02 of 94%. Instructed in energy conservation techniques.   Follow Up Recommendations  Home health OT    Equipment Recommendations  None recommended by OT    Recommendations for Other Services      Precautions / Restrictions Precautions Precautions: Fall Precaution Comments: monitor 02       Mobility Bed Mobility Overal bed mobility: Needs Assistance Bed Mobility: Supine to Sit;Sit to Supine     Supine to sit: Supervision;HOB elevated Sit to supine: Supervision   General bed mobility comments: for lines and safety    Transfers Overall transfer level: Needs assistance Equipment used: Rolling walker (2 wheeled) Transfers: Sit to/from Stand Sit to Stand: Min guard         General transfer comment: slow to rise, cues for hand placement with walker use    Balance Overall balance assessment: Needs assistance   Sitting balance-Leahy Scale: Good     Standing balance support: Single extremity supported;Bilateral upper extremity supported Standing balance-Leahy Scale: Poor Standing balance comment: reliant on UE support for dynamic balance, fair statically at sink                           ADL either performed  or assessed with clinical judgement   ADL Overall ADL's : Needs assistance/impaired     Grooming: Wash/dry hands;Standing;Min guard                   Toilet Transfer: Min guard;RW;BSC;Ambulation   Toileting- Water quality scientist and Hygiene: Min guard;Sit to/from stand       Functional mobility during ADLs: Min guard;Rolling walker General ADL Comments: pt on 2L 02     Vision       Perception     Praxis      Cognition Arousal/Alertness: Awake/alert Behavior During Therapy: WFL for tasks assessed/performed Overall Cognitive Status: Within Functional Limits for tasks assessed                                 General Comments: reports confusion upon waking this morning, thought she was at home        Exercises     Shoulder Instructions       General Comments      Pertinent Vitals/ Pain       Pain Assessment: No/denies pain  Home Living                                          Prior Functioning/Environment              Frequency  Min 2X/week        Progress Toward Goals  OT Goals(current goals can now be found in the care plan section)  Progress towards OT goals: Progressing toward goals  Acute Rehab OT Goals Patient Stated Goal: to return to independence OT Goal Formulation: With patient Time For Goal Achievement: 12/25/20 Potential to Achieve Goals: Good  Plan Discharge plan remains appropriate    Co-evaluation                 AM-PAC OT "6 Clicks" Daily Activity     Outcome Measure   Help from another person eating meals?: None Help from another person taking care of personal grooming?: A Little Help from another person toileting, which includes using toliet, bedpan, or urinal?: A Little Help from another person bathing (including washing, rinsing, drying)?: A Little Help from another person to put on and taking off regular upper body clothing?: None Help from another person to put on and  taking off regular lower body clothing?: A Little 6 Click Score: 20    End of Session Equipment Utilized During Treatment: Rolling walker;Oxygen;Gait belt (2L)  OT Visit Diagnosis: Unsteadiness on feet (R26.81);Other abnormalities of gait and mobility (R26.89);Muscle weakness (generalized) (M62.81);Other (comment) (decreased activity tolerance)   Activity Tolerance Patient tolerated treatment well   Patient Left in bed;with call bell/phone within reach   Nurse Communication          Time: 2458-0998 OT Time Calculation (min): 21 min  Charges: OT General Charges $OT Visit: 1 Visit OT Treatments $Self Care/Home Management : 8-22 mins  Rachael Buck, OTR/L Acute Rehabilitation Services Pager: [phone] Office: [phone]   Malka So 12/12/2020, 3:04 PM

## 2020-12-13 LAB — BASIC METABOLIC PANEL
Anion gap: 7 (ref 5–15)
BUN: 13 mg/dL (ref 8–23)
CO2: 26 mmol/L (ref 22–32)
Calcium: 8.3 mg/dL — ABNORMAL LOW (ref 8.9–10.3)
Chloride: 106 mmol/L (ref 98–111)
Creatinine, Ser: 0.53 mg/dL (ref 0.44–1.00)
GFR, Estimated: >60 mL/min (ref >60)
Glucose, Bld: 108 mg/dL — ABNORMAL HIGH (ref 70–99)
Potassium: 3.3 mmol/L — ABNORMAL LOW (ref 3.5–5.1)
Sodium: 139 mmol/L (ref 135–145)

## 2020-12-13 LAB — GLUCOSE, CAPILLARY
Glucose-Capillary: 140 mg/dL — ABNORMAL HIGH (ref 70–99)
Glucose-Capillary: 128 mg/dL — ABNORMAL HIGH (ref 70–99)
Glucose-Capillary: 111 mg/dL — ABNORMAL HIGH (ref 70–99)
Glucose-Capillary: 104 mg/dL — ABNORMAL HIGH (ref 70–99)

## 2020-12-13 LAB — CBC
HCT: 30.4 % — ABNORMAL LOW (ref 36.0–46.0)
Hemoglobin: 9.8 g/dL — ABNORMAL LOW (ref 12.0–15.0)
MCH: 27.1 pg (ref 26.0–34.0)
MCHC: 32.2 g/dL (ref 30.0–36.0)
MCV: 84.2 fL (ref 80.0–100.0)
Platelets: 55 10*3/uL — ABNORMAL LOW (ref 150–400)
RBC: 3.61 MIL/uL — ABNORMAL LOW (ref 3.87–5.11)
RDW: 18.2 % — ABNORMAL HIGH (ref 11.5–15.5)
WBC: 7.5 10*3/uL (ref 4.0–10.5)
nRBC: 0 % (ref 0.0–0.2)

## 2020-12-13 MED ORDER — POTASSIUM CHLORIDE CRYS ER 20 MEQ PO TBCR
40.0000 meq | EXTENDED_RELEASE_TABLET | Freq: Once | ORAL | Status: AC
  Administered 2020-12-13: 40 meq via ORAL
  Filled 2020-12-13: qty 2

## 2020-12-13 MED ORDER — IPRATROPIUM-ALBUTEROL 0.5-2.5 (3) MG/3ML IN SOLN
3.0000 mL | Freq: Three times a day (TID) | RESPIRATORY_TRACT | Status: DC
  Administered 2020-12-13 – 2020-12-14 (×4): 3 mL via RESPIRATORY_TRACT
  Filled 2020-12-13 (×4): qty 3

## 2020-12-13 NOTE — Progress Notes (Signed)
Rachael NOTE    MEMORY Buck  TWS:[id number] DOB: Mar 16, 1941 DOA: 12/09/2020 PCP: [name], MD   Chief Complaint  Patient presents with  . Shortness of Breath  Brief Narrative: 80 year old female with history of NASH/cirrhosis, diabetes, HTN, GERD, HLD, morbid obesity, asthma, anxiety disorder presented with progressive shortness of breath, cough for few days. In the ED low-grade temp nine 9.7 tachycardic oxygen 90% room air with significant leukocytosis elevated procalcitonin, hypokalemia and thrombocytopenia. 80 y.o. female with medical history significant of NASH cirrhosis, diabetes, hypertension, GERD, hyperlipidemia, morbid obesity, asthma and anxiety disorder who presented to the ER with progressive shortness of breath and cough for a few days, Lactic acidosis 7.4> 5.7. CT angiogram of the chest showed no PE but right middle lobe and right upper lobe infiltrates.  There is mild right basilar atelectasis on or infiltrate.  Small right pleural effusion.  Patient also has a 1.9 x 1.2 cm right infrahilar soft tissue nodule as well as evidence of cirrhotic liver.  Given IV fluids boluses 2 L, IV Solu-Medrol and was admitted.   Subjective: Doing well on 3-4L [place] Denies shortness of breath. No confusion.  Assessment & Plan:  Acute Hypoxic Respiratory Failure - Asthma/COPD, Pneumonia, Obesity - On 3-4L [place]. - Wean oxygen as tolerated.    Community Acquired Pneumonia: - On Day 4/10 of antibiotics - Zosyn and Azithromycin.  MRSA screen was negative. - Pulmonary is following, appreciate.  Asthma/COPD/Chronic Tobacco Use: - Start Pulmicort BID. - Continue nebulizers. - Pulmonary is following, appreciate.   Mild acute metabolic encephalopathy/confusion, resolved likely from hypoxia and hypercapnia and sepsis.    Uncontrolled diabetes mellitus, controlled HbA1c 6.7, a month ago.  Sugar well controlled on sliding scale insulin, jardiance and oral  glipizide.  Hypokalemia:Resolved  Hypomagnesemia, resolved-continue oral magnesium oxide   Acute renal failure, resolved- creatinine 0.53 mg/dL   HTN: BP is controlled. Not neeeding meds  NASH w/ cirrhosis of liver: She reports she follows up with Dr. Marin Olp.  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  Likely due to acute illness. Monitor-I have stopped Lovenox for now 4/14 - Platelets are at 55K with baseline 70-80K - likely due to cirrhosis.  Hold Lovenox and monitor.  Incidentally noted abnormal CT chest with  "1.9 cm x 1.3 cm noncalcified, nonenhancing soft tissue nodule is seen along the infrahilar aspect of the right lower lobe": Per recommendation the family and will follow up with PCP/Dr. Marin Olp Lung nodule - follow up with Pulmonary outpatient.  Morbid obesity BMI more than 30 will benefit with weight loss PCP follow-up, as well as sleep apnea evaluation. - Counseled on weight loss.  Diet Order            Diet Carb Modified Fluid consistency: Thin; Room service appropriate? Yes  Diet effective now               Patient's Body mass index is 31.06 kg/m.  DVT prophylaxis: Place and maintain sequential compression device Start: 12/11/20 0809- hold and change to scd 2/2 low platelets Code Status:   Code Status: Full Code  Family Communication: plan of care discussed with patient at bedside. Discussed with nursing staff.    Status is: Inpatient Remains inpatient appropriate because:IV treatments appropriate due to intensity of illness or inability to take PO and Inpatient level of care appropriate due to severity of illness for ongoing management of respiratory failure sepsis.  Dispo: The patient is from:  Home              Anticipated d/c is to: TBD.  cont PT OT  eval.              Patient currently is not medically stable to d/c.   Difficult to place patient No  Unresulted Labs (From admission, onward)         None      Medications reviewed:  Scheduled Meds: . atorvastatin  40 mg Oral Daily  . budesonide (PULMICORT) nebulizer solution  0.5 mg Nebulization BID  . colestipol  1 g Oral Daily  . dicyclomine  20 mg Oral TID AC & HS  . empagliflozin  10 mg Oral Daily  . glipiZIDE  5 mg Oral Q breakfast  . guaiFENesin  600 mg Oral BID  . hydrocortisone cream   Topical BID  . insulin aspart  0-15 Units Subcutaneous TID WC  . insulin aspart  0-5 Units Subcutaneous QHS  . ipratropium-albuterol  3 mL Nebulization TID  . mouth rinse  15 mL Mouth Rinse BID  . pantoprazole  40 mg Oral Daily  . sertraline  100 mg Oral Daily   Continuous Infusions: . azithromycin 500 mg (12/12/20 1736)  . piperacillin-tazobactam (ZOSYN)  IV 3.375 g (12/13/20 1252)    Consultants:see note  Procedures:see note  Antimicrobials: Anti-infectives (From admission, onward)   Start     Dose/Rate Route Frequency Ordered Stop   12/11/20 1500  vancomycin (VANCOREADY) IVPB 1500 mg/300 mL  Status:  Discontinued        1,500 mg 150 mL/hr over 120 Minutes Intravenous Every 24 hours 12/10/20 1211 12/11/20 0825   12/10/20 2000  piperacillin-tazobactam (ZOSYN) IVPB 3.375 g        3.375 g 12.5 mL/hr over 240 Minutes Intravenous Every 8 hours 12/10/20 1211 12/20/20 1959   12/10/20 1800  cefTRIAXone (ROCEPHIN) 2 g in sodium chloride 0.9 % 100 mL IVPB  Status:  Discontinued        2 g 200 mL/hr over 30 Minutes Intravenous Every 24 hours 12/09/20 2113 12/10/20 1210   12/10/20 1800  azithromycin (ZITHROMAX) 500 mg in sodium chloride 0.9 % 250 mL IVPB  Status:  Discontinued        500 mg 250 mL/hr over 60 Minutes Intravenous Every 24 hours 12/09/20 2113 12/10/20 1210   12/10/20 1800  azithromycin (ZITHROMAX) 500 mg in sodium chloride 0.9 % 250 mL IVPB        500 mg 250 mL/hr over 60 Minutes Intravenous Every 24 hours 12/10/20 1212 12/14/20 1759   12/10/20 1500  vancomycin (VANCOREADY) IVPB 1750 mg/350 mL        1,750 mg 175 mL/hr over 120  Minutes Intravenous Once 12/10/20 1409 12/10/20 1646   12/10/20 1300  vancomycin (VANCOCIN) 1,750 mg in sodium chloride 0.9 % 500 mL IVPB  Status:  Discontinued        1,750 mg 250 mL/hr over 120 Minutes Intravenous  Once 12/10/20 1211 12/10/20 1500   12/10/20 1300  piperacillin-tazobactam (ZOSYN) IVPB 3.375 g        3.375 g 100 mL/hr over 30 Minutes Intravenous  Once 12/10/20 1211 12/11/20 0840   12/09/20 1715  cefTRIAXone (ROCEPHIN) 1 g in sodium chloride 0.9 % 100 mL IVPB        1 g 200 mL/hr over 30 Minutes Intravenous  Once 12/09/20 1714 12/09/20 1821   12/09/20 1715  azithromycin (ZITHROMAX) 500 mg in sodium chloride 0.9 % 250 mL  IVPB        500 mg 250 mL/hr over 60 Minutes Intravenous  Once 12/09/20 1714 12/09/20 1921     Culture/Microbiology    Component Value Date/Time   SDES BLOOD RIGHT WRIST 12/09/2020 2145   SPECREQUEST  12/09/2020 2145    BOTTLES DRAWN AEROBIC AND ANAEROBIC Blood Culture adequate volume   CULT  12/09/2020 2145    NO GROWTH 4 DAYS Performed at [facility], [place] [address] [place], [place] [address]    REPTSTATUS PENDING 12/09/2020 2145    Other culture-see note  Objective: Vitals: Today's Vitals   12/13/20 0405 12/13/20 0752 12/13/20 0821 12/13/20 1428  BP: 138/67  (!) 144/59   Pulse: 88  84   Resp: 19  20   Temp: 98.4 F (36.9 C)  98.2 F (36.8 C)   TempSrc: Oral  Oral   SpO2: 99% 99% 99% 100%  Weight:      Height:      PainSc:   2      Intake/Output Summary (Last 24 hours) at 12/13/2020 1821 Last data filed at 12/13/2020 1800 Gross per 24 hour  Intake --  Output 1375 ml  Net -1375 ml   Filed Weights   12/10/20 0342 12/11/20 0324 12/12/20 0503  Weight: 87.8 kg 88.5 kg 87.3 kg   Weight change:   Intake/Output from previous day: 04/15 0701 - 04/16 0700 In: 332.6 [P.O.:240; IV Piggyback:92.6] Out: 900 [Urine:900] Intake/Output this shift: Total I/O In: -  Out: 1375 [Urine:1375] Filed Weights   12/10/20 0342 12/11/20  0324 12/12/20 0503  Weight: 87.8 kg 88.5 kg 87.3 kg    Examination: General exam: AAOx3, obese, pleasant, NAD, weak appearing. HEENT:Oral mucosa moist, Ear/Nose WNL grossly, dentition normal. Respiratory system: bilaterally air entry present no use of accessory respiratory muscle Cardiovascular system: S1 & S2 +, No JVD,. Gastrointestinal system: Abdomen soft, NT,ND, BS+ Nervous System:Alert, awake, moving extremities and grossly nonfocal Extremities: No edema, distal peripheral pulses palpable.  Skin:No rashes,no icterus. WIO:XBDZHG muscle bulk,tone, power.  Data Reviewed: I have personally reviewed following labs and imaging studies CBC: Recent Labs  Lab 12/09/20 1638 12/09/20 1720 12/09/20 1816 12/10/20 0241 12/11/20 0255 12/12/20 0350 12/13/20 0141  WBC 28.9*  --   --  35.0* 15.6* 12.2* 7.5  NEUTROABS 25.2*  --   --   --   --   --   --   HGB 12.3   < > 12.6 11.5* 9.5* 9.3* 9.8*  HCT 38.2   < > 37.0 33.9* 29.1* 28.9* 30.4*  MCV 84.1  --   --  83.5 84.1 84.0 84.2  PLT 82*  --   --  68* 58* 52* 55*   < > = values in this interval not displayed.   Basic Metabolic Panel: Recent Labs  Lab 12/09/20 1638 12/09/20 1720 12/09/20 1816 12/10/20 0241 12/11/20 0255 12/11/20 1436 12/12/20 0350 12/13/20 0141  NA 136   < > 138 136 138  --  136 139  K 2.3*   < > 2.4* 3.2* 2.9* 4.3 3.6 3.3*  CL 99  --  98 100 103  --  105 106  CO2 21*  --   --  25 26  --  23 26  GLUCOSE 129*  --  124* 168* 105*  --  85 108*  BUN 16  --  18 17 19   --  19 13  CREATININE 1.25*  --  1.00 0.94 0.73  --  0.63 0.53  CALCIUM 8.4*  --   --  8.3* 8.3*  --  8.2* 8.3*  MG 1.2*  --   --  1.8  --   --   --   --    < > = values in this interval not displayed.   GFR: Estimated Creatinine Clearance: 64.5 mL/min (by C-G formula based on SCr of 0.53 mg/dL). Liver Function Tests: Recent Labs  Lab 12/09/20 1638 12/10/20 0241  AST 34 30  ALT 14 18  ALKPHOS 51 43  BILITOT 1.8* 1.8*  PROT 6.2* 6.6   ALBUMIN 3.2* 3.3*   No results for input(s): LIPASE, AMYLASE in the last 168 hours. No results for input(s): AMMONIA in the last 168 hours. Coagulation Profile: Recent Labs  Lab 12/10/20 0241  INR 1.6*   Cardiac Enzymes: No results for input(s): CKTOTAL, CKMB, CKMBINDEX, TROPONINI in the last 168 hours. BNP (last 3 results) No results for input(s): PROBNP in the last 8760 hours. HbA1C: No results for input(s): HGBA1C in the last 72 hours. CBG: Recent Labs  Lab 12/12/20 1633 12/12/20 2138 12/13/20 0736 12/13/20 1239 12/13/20 1736  GLUCAP 114* 119* 140* 128* 111*   Lipid Profile: No results for input(s): CHOL, HDL, LDLCALC, TRIG, CHOLHDL, LDLDIRECT in the last 72 hours. Thyroid Function Tests: No results for input(s): TSH, T4TOTAL, FREET4, T3FREE, THYROIDAB in the last 72 hours. Anemia Panel: No results for input(s): VITAMINB12, FOLATE, FERRITIN, TIBC, IRON, RETICCTPCT in the last 72 hours. Sepsis Labs: Recent Labs  Lab 12/09/20 1714 12/09/20 1914 12/10/20 0241 12/10/20 1125 12/10/20 1836 12/11/20 0255 12/12/20 0350  PROCALCITON  --   --  43.89  --   --  24.00 11.48  LATICACIDVEN 7.4* 5.7*  --  3.7* 2.6*  --   --     Recent Results (from the past 240 hour(s))  Resp Panel by RT-PCR (Flu A&B, Covid) Nasopharyngeal Swab     Status: None   Collection Time: 12/09/20  5:15 PM   Specimen: Nasopharyngeal Swab; Nasopharyngeal(NP) swabs in vial transport medium  Result Value Ref Range Status   SARS Coronavirus 2 by RT PCR NEGATIVE NEGATIVE Final    Comment: (NOTE) SARS-CoV-2 target nucleic acids are NOT DETECTED.  The SARS-CoV-2 RNA is generally detectable in upper respiratory specimens during the acute phase of infection. The lowest concentration of SARS-CoV-2 viral copies this assay can detect is 138 copies/mL. A negative result does not preclude SARS-Cov-2 infection and should not be used as the sole basis for treatment or other patient management decisions. A  negative result may occur with  improper specimen collection/handling, submission of specimen other than nasopharyngeal swab, presence of viral mutation(s) within the areas targeted by this assay, and inadequate number of viral copies(<138 copies/mL). A negative result must be combined with clinical observations, patient history, and epidemiological information. The expected result is Negative.  Fact Sheet for Patients:  EntrepreneurPulse.com.au  Fact Sheet for Healthcare Providers:  IncredibleEmployment.be  This test is no t yet approved or cleared by the [place] FDA and  has been authorized for detection and/or diagnosis of SARS-CoV-2 by FDA under an Emergency Use Authorization (EUA). This EUA will remain  in effect (meaning this test can be used) for the duration of the COVID-19 declaration under Section 564(b)(1) of the Act, 21 U.S.C.section 360bbb-3(b)(1), unless the authorization is terminated  or revoked sooner.       Influenza A by PCR NEGATIVE NEGATIVE Final   Influenza B by PCR NEGATIVE NEGATIVE Final  Comment: (NOTE) The Xpert Xpress SARS-CoV-2/FLU/RSV plus assay is intended as an aid in the diagnosis of influenza from Nasopharyngeal swab specimens and should not be used as a sole basis for treatment. Nasal washings and aspirates are unacceptable for Xpert Xpress SARS-CoV-2/FLU/RSV testing.  Fact Sheet for Patients: EntrepreneurPulse.com.au  Fact Sheet for Healthcare Providers: IncredibleEmployment.be  This test is not yet approved or cleared by the [place] FDA and has been authorized for detection and/or diagnosis of SARS-CoV-2 by FDA under an Emergency Use Authorization (EUA). This EUA will remain in effect (meaning this test can be used) for the duration of the COVID-19 declaration under Section 564(b)(1) of the Act, 21 U.S.C. section 360bbb-3(b)(1), unless the authorization  is terminated or revoked.  Performed at [facility], [place] [address] [place], [place] [address]   Blood culture (routine x 2)     Status: None (Preliminary result)   Collection Time: 12/09/20  5:36 PM   Specimen: BLOOD  Result Value Ref Range Status   Specimen Description BLOOD SITE NOT SPECIFIED  Final   Special Requests   Final    BOTTLES DRAWN AEROBIC AND ANAEROBIC Blood Culture adequate volume   Culture   Final    NO GROWTH 4 DAYS Performed at [facility], [place] [address], [place], [place] [address]    Report Status PENDING  Incomplete  Blood culture (routine x 2)     Status: None (Preliminary result)   Collection Time: 12/09/20  9:45 PM   Specimen: BLOOD RIGHT WRIST  Result Value Ref Range Status   Specimen Description BLOOD RIGHT WRIST  Final   Special Requests   Final    BOTTLES DRAWN AEROBIC AND ANAEROBIC Blood Culture adequate volume   Culture   Final    NO GROWTH 4 DAYS Performed at [facility], [place] [address] [place], [place] [address]    Report Status PENDING  Incomplete  MRSA PCR Screening     Status: None   Collection Time: 12/10/20 11:26 AM   Specimen: Nasal Mucosa; Nasopharyngeal  Result Value Ref Range Status   MRSA by PCR NEGATIVE NEGATIVE Final    Comment:        The GeneXpert MRSA Assay (FDA approved for NASAL specimens only), is one component of a comprehensive MRSA colonization surveillance program. It is not intended to diagnose MRSA infection nor to guide or monitor treatment for MRSA infections. Performed at [facility], [place] [address] [place], [place] [address]      Radiology Studies: No results found.   LOS: 4 days   George Hugh, MD [facility]  12/13/2020, 6:21 PM

## 2020-12-14 LAB — CBC WITH DIFFERENTIAL/PLATELET
Abs Immature Granulocytes: 0.31 10*3/uL — ABNORMAL HIGH (ref 0.00–0.07)
Basophils Absolute: 0 10*3/uL (ref 0.0–0.1)
Basophils Relative: 1 %
Eosinophils Absolute: 0 10*3/uL (ref 0.0–0.5)
Eosinophils Relative: 1 %
HCT: 31.3 % — ABNORMAL LOW (ref 36.0–46.0)
Hemoglobin: 9.9 g/dL — ABNORMAL LOW (ref 12.0–15.0)
Immature Granulocytes: 4 %
Lymphocytes Relative: 13 %
Lymphs Abs: 1 10*3/uL (ref 0.7–4.0)
MCH: 27.1 pg (ref 26.0–34.0)
MCHC: 31.6 g/dL (ref 30.0–36.0)
MCV: 85.8 fL (ref 80.0–100.0)
Monocytes Absolute: 0.7 10*3/uL (ref 0.1–1.0)
Monocytes Relative: 9 %
Neutro Abs: 5.3 10*3/uL (ref 1.7–7.7)
Neutrophils Relative %: 72 %
Platelets: 56 10*3/uL — ABNORMAL LOW (ref 150–400)
RBC: 3.65 MIL/uL — ABNORMAL LOW (ref 3.87–5.11)
RDW: 17.8 % — ABNORMAL HIGH (ref 11.5–15.5)
WBC: 7.3 10*3/uL (ref 4.0–10.5)
nRBC: 0 % (ref 0.0–0.2)

## 2020-12-14 LAB — CULTURE, BLOOD (ROUTINE X 2)
Culture: NO GROWTH
Culture: NO GROWTH
Special Requests: ADEQUATE
Special Requests: ADEQUATE

## 2020-12-14 LAB — HEPATIC FUNCTION PANEL
ALT: 16 U/L (ref 0–44)
AST: 20 U/L (ref 15–41)
Albumin: 2.7 g/dL — ABNORMAL LOW (ref 3.5–5.0)
Alkaline Phosphatase: 44 U/L (ref 38–126)
Bilirubin, Direct: 0.3 mg/dL — ABNORMAL HIGH (ref 0.0–0.2)
Indirect Bilirubin: 0.7 mg/dL (ref 0.3–0.9)
Total Bilirubin: 1 mg/dL (ref 0.3–1.2)
Total Protein: 6.2 g/dL — ABNORMAL LOW (ref 6.5–8.1)

## 2020-12-14 LAB — BASIC METABOLIC PANEL
Anion gap: 7 (ref 5–15)
BUN: 11 mg/dL (ref 8–23)
CO2: 27 mmol/L (ref 22–32)
Calcium: 8.5 mg/dL — ABNORMAL LOW (ref 8.9–10.3)
Chloride: 107 mmol/L (ref 98–111)
Creatinine, Ser: 0.47 mg/dL (ref 0.44–1.00)
GFR, Estimated: >60 mL/min (ref >60)
Glucose, Bld: 103 mg/dL — ABNORMAL HIGH (ref 70–99)
Potassium: 3.8 mmol/L (ref 3.5–5.1)
Sodium: 141 mmol/L (ref 135–145)

## 2020-12-14 LAB — GLUCOSE, CAPILLARY
Glucose-Capillary: 116 mg/dL — ABNORMAL HIGH (ref 70–99)
Glucose-Capillary: 193 mg/dL — ABNORMAL HIGH (ref 70–99)
Glucose-Capillary: 157 mg/dL — ABNORMAL HIGH (ref 70–99)
Glucose-Capillary: 133 mg/dL — ABNORMAL HIGH (ref 70–99)

## 2020-12-14 MED ORDER — PREDNISONE 20 MG PO TABS
40.0000 mg | ORAL_TABLET | Freq: Every day | ORAL | Status: DC
  Administered 2020-12-14 – 2020-12-15 (×2): 40 mg via ORAL
  Filled 2020-12-14 (×2): qty 2

## 2020-12-14 MED ORDER — IPRATROPIUM-ALBUTEROL 0.5-2.5 (3) MG/3ML IN SOLN: 3.0000 mL | Freq: Four times a day (QID) | RESPIRATORY_TRACT | Status: DC | PRN

## 2020-12-14 NOTE — Progress Notes (Signed)
PROGRESS NOTE    Rachael Buck  OFB:[id number] DOB: 11-24-1940 DOA: 12/09/2020 PCP: [name], MD   Chief Complaint  Patient presents with  . 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  CTA was negative for PE but showed extensive RML and RUL infiltrates with air bronchograms consistent with pneumonia.    Subjective: Patient states she is feeling much better. Shortness of breath and cough have resolved. I weaned oxygen from 4L to 1L [place] maintaining oxygen saturation 92-94%.  I discussed with nursing to wean off today and perform ambulatory pulse ox in am. The plan of care was discussed with family at bedside.  Assessment & Plan:  Acute Hypoxic Respiratory Failure - Acute Asthma/COPD, Severe Pneumonia, Morbid Obesity: - On 1L . - Wean off oxygen and perform ambulatory pulse ox tomorrow. - Hopefully patient can be discharged tomorrow.    Community Acquired Pneumonia: Chest CT showed severe RUL and RML infiltrates. - On Day 5/10 of antibiotics - Zosyn and Azithromycin.  MRSA screen was negative. - If patient is stable, will discharge tomorrow on Amoxicillin and Doxycycline x 4 days to complete 10 day course. - Pulmonary is following, appreciate.  Asthma/COPD/Chronic Tobacco Use: - Continue Pulmicort BID and nebulizers. - On Day 1/5 of Prednisone 40 mg daily. - Patient will follow up with Pulmonologist outpatient.  Medial Right Subhilar Soft Tissue Density, unclear etiology: - Repeat Chest CT in 3 months. - Patient follows with Oncologist Dr. Marin Olp.  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 - Encourage weight loss.  Acute Metabolic Encephalothy, unspecified, resolved: - Mental status is at baseline.  Diabetes Mellitus Type 2: - Continue Lispro SSi with POC glucose q6hrs.  Morbid Obesity (BMI 30 kg/m2): - Counseled on weight loss through diet and lifestyle modifications.  Suspected Sleep Apnea: Husband reports wife has difficulty breathing at night. - I recommended sleep study outpatient.  Diet Order            Diet Carb Modified Fluid consistency: Thin; Room service appropriate? Yes  Diet effective now               Patient's Body mass index is 30.91 kg/m.  DVT prophylaxis: Place and maintain sequential compression device Start: 12/11/20 0809- hold and change to scd 2/2 low platelets Code Status:   Code Status: Full Code  Family Communication: plan of care discussed with patient at bedside. Discussed with nursing staff.    Status is: Inpatient Remains inpatient appropriate because:IV treatments appropriate due to intensity of illness or inability to take PO and Inpatient level of care appropriate due to severity of illness for ongoing management of respiratory failure sepsis.  Dispo: The patient is from: Home              Anticipated d/c is to: Home with [facility] and PT- lives with husband.  Son and daughter will take turns taking care of her.              Patient currently is not medically stable to d/c.   Difficult to place patient No  Unresulted Labs (From admission, onward)  Start     Ordered   12/14/20 0500  CBC with Differential/Platelet  Daily,   R     Question:  Specimen collection method  Answer:  Lab=Lab collect   12/13/20 1828   12/14/20 0258  Basic metabolic panel  Daily,   R     Question:  Specimen collection method  Answer:  Lab=Lab collect   12/13/20 1828         Medications reviewed:  Scheduled Meds: . atorvastatin  40 mg Oral Daily  . budesonide (PULMICORT) nebulizer solution  0.5 mg  Nebulization BID  . colestipol  1 g Oral Daily  . dicyclomine  20 mg Oral TID AC & HS  . empagliflozin  10 mg Oral Daily  . glipiZIDE  5 mg Oral Q breakfast  . guaiFENesin  600 mg Oral BID  . hydrocortisone cream   Topical BID  . insulin aspart  0-15 Units Subcutaneous TID WC  . insulin aspart  0-5 Units Subcutaneous QHS  . mouth rinse  15 mL Mouth Rinse BID  . pantoprazole  40 mg Oral Daily  . predniSONE  40 mg Oral Q breakfast  . sertraline  100 mg Oral Daily   Continuous Infusions: . piperacillin-tazobactam (ZOSYN)  IV 3.375 g (12/14/20 1203)    Consultants:see note  Procedures:see note  Antimicrobials: Anti-infectives (From admission, onward)   Start     Dose/Rate Route Frequency Ordered Stop   12/11/20 1500  vancomycin (VANCOREADY) IVPB 1500 mg/300 mL  Status:  Discontinued        1,500 mg 150 mL/hr over 120 Minutes Intravenous Every 24 hours 12/10/20 1211 12/11/20 0825   12/10/20 2000  piperacillin-tazobactam (ZOSYN) IVPB 3.375 g        3.375 g 12.5 mL/hr over 240 Minutes Intravenous Every 8 hours 12/10/20 1211 12/20/20 1959   12/10/20 1800  cefTRIAXone (ROCEPHIN) 2 g in sodium chloride 0.9 % 100 mL IVPB  Status:  Discontinued        2 g 200 mL/hr over 30 Minutes Intravenous Every 24 hours 12/09/20 2113 12/10/20 1210   12/10/20 1800  azithromycin (ZITHROMAX) 500 mg in sodium chloride 0.9 % 250 mL IVPB  Status:  Discontinued        500 mg 250 mL/hr over 60 Minutes Intravenous Every 24 hours 12/09/20 2113 12/10/20 1210   12/10/20 1800  azithromycin (ZITHROMAX) 500 mg in sodium chloride 0.9 % 250 mL IVPB        500 mg 250 mL/hr over 60 Minutes Intravenous Every 24 hours 12/10/20 1212 12/13/20 1923   12/10/20 1500  vancomycin (VANCOREADY) IVPB 1750 mg/350 mL        1,750 mg 175 mL/hr over 120 Minutes Intravenous Once 12/10/20 1409 12/10/20 1646   12/10/20 1300  vancomycin (VANCOCIN) 1,750 mg in sodium chloride 0.9 % 500 mL IVPB  Status:  Discontinued        1,750 mg 250  mL/hr over 120 Minutes Intravenous  Once 12/10/20 1211 12/10/20 1500   12/10/20 1300  piperacillin-tazobactam (ZOSYN) IVPB 3.375 g        3.375 g 100 mL/hr over 30 Minutes Intravenous  Once 12/10/20 1211 12/11/20 0840   12/09/20 1715  cefTRIAXone (ROCEPHIN) 1 g in sodium chloride 0.9 % 100 mL IVPB        1 g 200 mL/hr over 30 Minutes Intravenous  Once 12/09/20 1714 12/09/20 1821   12/09/20 1715  azithromycin (ZITHROMAX) 500 mg in sodium chloride 0.9 % 250 mL IVPB  500 mg 250 mL/hr over 60 Minutes Intravenous  Once 12/09/20 1714 12/09/20 1921     Culture/Microbiology    Component Value Date/Time   SDES BLOOD RIGHT WRIST 12/09/2020 2145   SPECREQUEST  12/09/2020 2145    BOTTLES DRAWN AEROBIC AND ANAEROBIC Blood Culture adequate volume   CULT  12/09/2020 2145    NO GROWTH 4 DAYS Performed at [facility], [place] [address], [place], [place] [address]    REPTSTATUS PENDING 12/09/2020 2145    Other culture-see note  Objective: Vitals: Today's Vitals   12/14/20 0434 12/14/20 0518 12/14/20 0800 12/14/20 0909  BP: (!) 151/70     Pulse: 93     Resp: 20     Temp: 98.9 F (37.2 C)     TempSrc: Oral     SpO2: 100% 98%  100%  Weight: 86.9 kg     Height:      PainSc:   0-No pain     Intake/Output Summary (Last 24 hours) at 12/14/2020 1649 Last data filed at 12/13/2020 2032 Gross per 24 hour  Intake --  Output 650 ml  Net -650 ml   Filed Weights   12/11/20 0324 12/12/20 0503 12/14/20 0434  Weight: 88.5 kg 87.3 kg 86.9 kg   Weight change:   Intake/Output from previous day: 04/16 0701 - 04/17 0700 In: -  Out: 1575 [Urine:1575] Intake/Output this shift: No intake/output data recorded. Filed Weights   12/11/20 0324 12/12/20 0503 12/14/20 0434  Weight: 88.5 kg 87.3 kg 86.9 kg    Examination: General exam: AAOx3, obese, pleasant, NAD, weak appearing. HEENT:Oral mucosa moist, Ear/Nose WNL grossly, dentition normal. Respiratory system: bilaterally air entry present  no use of accessory respiratory muscle Cardiovascular system: S1 & S2 +, No JVD,. Gastrointestinal system: Abdomen soft, NT,ND, BS+ Nervous System:Alert, awake, moving extremities and grossly nonfocal Extremities: No edema, distal peripheral pulses palpable.  Skin:No rashes,no icterus. GQQ:PYPPJK muscle bulk,tone, power.  Data Reviewed: I have personally reviewed following labs and imaging studies CBC: Recent Labs  Lab 12/09/20 1638 12/09/20 1720 12/10/20 0241 12/11/20 0255 12/12/20 0350 12/13/20 0141 12/14/20 0229  WBC 28.9*  --  35.0* 15.6* 12.2* 7.5 7.3  NEUTROABS 25.2*  --   --   --   --   --  5.3  HGB 12.3   < > 11.5* 9.5* 9.3* 9.8* 9.9*  HCT 38.2   < > 33.9* 29.1* 28.9* 30.4* 31.3*  MCV 84.1  --  83.5 84.1 84.0 84.2 85.8  PLT 82*  --  68* 58* 52* 55* 56*   < > = values in this interval not displayed.   Basic Metabolic Panel: Recent Labs  Lab 12/09/20 1638 12/09/20 1720 12/10/20 0241 12/11/20 0255 12/11/20 1436 12/12/20 0350 12/13/20 0141 12/14/20 0229  NA 136   < > 136 138  --  136 139 141  K 2.3*   < > 3.2* 2.9* 4.3 3.6 3.3* 3.8  CL 99   < > 100 103  --  105 106 107  CO2 21*  --  25 26  --  23 26 27   GLUCOSE 129*   < > 168* 105*  --  85 108* 103*  BUN 16   < > 17 19  --  19 13 11   CREATININE 1.25*   < > 0.94 0.73  --  0.63 0.53 0.47  CALCIUM 8.4*  --  8.3* 8.3*  --  8.2* 8.3* 8.5*  MG 1.2*  --  1.8  --   --   --   --   --    < > =  values in this interval not displayed.   GFR: Estimated Creatinine Clearance: 64.3 mL/min (by C-G formula based on SCr of 0.47 mg/dL). Liver Function Tests: Recent Labs  Lab 12/09/20 1638 12/10/20 0241 12/14/20 0229  AST 34 30 20  ALT 14 18 16   ALKPHOS 51 43 44  BILITOT 1.8* 1.8* 1.0  PROT 6.2* 6.6 6.2*  ALBUMIN 3.2* 3.3* 2.7*   No results for input(s): LIPASE, AMYLASE in the last 168 hours. No results for input(s): AMMONIA in the last 168 hours. Coagulation Profile: Recent Labs  Lab 12/10/20 0241  INR 1.6*    Cardiac Enzymes: No results for input(s): CKTOTAL, CKMB, CKMBINDEX, TROPONINI in the last 168 hours. BNP (last 3 results) No results for input(s): PROBNP in the last 8760 hours. HbA1C: No results for input(s): HGBA1C in the last 72 hours. CBG: Recent Labs  Lab 12/13/20 1239 12/13/20 1736 12/13/20 2027 12/14/20 0749 12/14/20 1145  GLUCAP 128* 111* 104* 116* 193*   Lipid Profile: No results for input(s): CHOL, HDL, LDLCALC, TRIG, CHOLHDL, LDLDIRECT in the last 72 hours. Thyroid Function Tests: No results for input(s): TSH, T4TOTAL, FREET4, T3FREE, THYROIDAB in the last 72 hours. Anemia Panel: No results for input(s): VITAMINB12, FOLATE, FERRITIN, TIBC, IRON, RETICCTPCT in the last 72 hours. Sepsis Labs: Recent Labs  Lab 12/09/20 1714 12/09/20 1914 12/10/20 0241 12/10/20 1125 12/10/20 1836 12/11/20 0255 12/12/20 0350  PROCALCITON  --   --  43.89  --   --  24.00 11.48  LATICACIDVEN 7.4* 5.7*  --  3.7* 2.6*  --   --     Recent Results (from the past 240 hour(s))  Resp Panel by RT-PCR (Flu A&B, Covid) Nasopharyngeal Swab     Status: None   Collection Time: 12/09/20  5:15 PM   Specimen: Nasopharyngeal Swab; Nasopharyngeal(NP) swabs in vial transport medium  Result Value Ref Range Status   SARS Coronavirus 2 by RT PCR NEGATIVE NEGATIVE Final    Comment: (NOTE) SARS-CoV-2 target nucleic acids are NOT DETECTED.  The SARS-CoV-2 RNA is generally detectable in upper respiratory specimens during the acute phase of infection. The lowest concentration of SARS-CoV-2 viral copies this assay can detect is 138 copies/mL. A negative result does not preclude SARS-Cov-2 infection and should not be used as the sole basis for treatment or other patient management decisions. A negative result may occur with  improper specimen collection/handling, submission of specimen other than nasopharyngeal swab, presence of viral mutation(s) within the areas targeted by this assay, and inadequate  number of viral copies(<138 copies/mL). A negative result must be combined with clinical observations, patient history, and epidemiological information. The expected result is Negative.  Fact Sheet for Patients:  EntrepreneurPulse.com.au  Fact Sheet for Healthcare Providers:  IncredibleEmployment.be  This test is no t yet approved or cleared by the [place] FDA and  has been authorized for detection and/or diagnosis of SARS-CoV-2 by FDA under an Emergency Use Authorization (EUA). This EUA will remain  in effect (meaning this test can be used) for the duration of the COVID-19 declaration under Section 564(b)(1) of the Act, 21 U.S.C.section 360bbb-3(b)(1), unless the authorization is terminated  or revoked sooner.       Influenza A by PCR NEGATIVE NEGATIVE Final   Influenza B by PCR NEGATIVE NEGATIVE Final    Comment: (NOTE) The Xpert Xpress SARS-CoV-2/FLU/RSV plus assay is intended as an aid in the diagnosis of influenza from Nasopharyngeal swab specimens and should not be used as a sole basis for treatment. Nasal  washings and aspirates are unacceptable for Xpert Xpress SARS-CoV-2/FLU/RSV testing.  Fact Sheet for Patients: EntrepreneurPulse.com.au  Fact Sheet for Healthcare Providers: IncredibleEmployment.be  This test is not yet approved or cleared by the [place] FDA and has been authorized for detection and/or diagnosis of SARS-CoV-2 by FDA under an Emergency Use Authorization (EUA). This EUA will remain in effect (meaning this test can be used) for the duration of the COVID-19 declaration under Section 564(b)(1) of the Act, 21 U.S.C. section 360bbb-3(b)(1), unless the authorization is terminated or revoked.  Performed at [facility], [place] [address], [place], [place] [address]   Blood culture (routine x 2)     Status: None (Preliminary result)   Collection Time: 12/09/20  5:36 PM    Specimen: BLOOD  Result Value Ref Range Status   Specimen Description BLOOD SITE NOT SPECIFIED  Final   Special Requests   Final    BOTTLES DRAWN AEROBIC AND ANAEROBIC Blood Culture adequate volume   Culture   Final    NO GROWTH 4 DAYS Performed at [facility], [place] [address] [place], [place] [address]    Report Status PENDING  Incomplete  Blood culture (routine x 2)     Status: None (Preliminary result)   Collection Time: 12/09/20  9:45 PM   Specimen: BLOOD RIGHT WRIST  Result Value Ref Range Status   Specimen Description BLOOD RIGHT WRIST  Final   Special Requests   Final    BOTTLES DRAWN AEROBIC AND ANAEROBIC Blood Culture adequate volume   Culture   Final    NO GROWTH 4 DAYS Performed at [facility], [place] [address] [place], [place] [address]    Report Status PENDING  Incomplete  MRSA PCR Screening     Status: None   Collection Time: 12/10/20 11:26 AM   Specimen: Nasal Mucosa; Nasopharyngeal  Result Value Ref Range Status   MRSA by PCR NEGATIVE NEGATIVE Final    Comment:        The GeneXpert MRSA Assay (FDA approved for NASAL specimens only), is one component of a comprehensive MRSA colonization surveillance program. It is not intended to diagnose MRSA infection nor to guide or monitor treatment for MRSA infections. Performed at [facility], [place] [address], [place], [place] [address]      Radiology Studies: No results found.   LOS: 5 days   George Hugh, MD [facility]  12/14/2020, 4:49 PM

## 2020-12-15 ENCOUNTER — Encounter: Payer: Self-pay | Admitting: Family Medicine

## 2020-12-15 ENCOUNTER — Other Ambulatory Visit: Payer: Self-pay | Admitting: Internal Medicine

## 2020-12-15 LAB — CBC WITH DIFFERENTIAL/PLATELET
Abs Immature Granulocytes: 0.63 10*3/uL — ABNORMAL HIGH (ref 0.00–0.07)
Basophils Absolute: 0.1 10*3/uL (ref 0.0–0.1)
Basophils Relative: 1 %
Eosinophils Absolute: 0 10*3/uL (ref 0.0–0.5)
Eosinophils Relative: 1 %
HCT: 32.7 % — ABNORMAL LOW (ref 36.0–46.0)
Hemoglobin: 10.3 g/dL — ABNORMAL LOW (ref 12.0–15.0)
Immature Granulocytes: 9 %
Lymphocytes Relative: 15 %
Lymphs Abs: 1.1 10*3/uL (ref 0.7–4.0)
MCH: 26.7 pg (ref 26.0–34.0)
MCHC: 31.5 g/dL (ref 30.0–36.0)
MCV: 84.7 fL (ref 80.0–100.0)
Monocytes Absolute: 0.6 10*3/uL (ref 0.1–1.0)
Monocytes Relative: 8 %
Neutro Abs: 5 10*3/uL (ref 1.7–7.7)
Neutrophils Relative %: 66 %
Platelets: 68 10*3/uL — ABNORMAL LOW (ref 150–400)
RBC: 3.86 MIL/uL — ABNORMAL LOW (ref 3.87–5.11)
RDW: 17.4 % — ABNORMAL HIGH (ref 11.5–15.5)
WBC: 7.4 10*3/uL (ref 4.0–10.5)
nRBC: 0 % (ref 0.0–0.2)

## 2020-12-15 LAB — GLUCOSE, CAPILLARY
Glucose-Capillary: 127 mg/dL — ABNORMAL HIGH (ref 70–99)
Glucose-Capillary: 184 mg/dL — ABNORMAL HIGH (ref 70–99)

## 2020-12-15 LAB — BASIC METABOLIC PANEL
Anion gap: 9 (ref 5–15)
BUN: 12 mg/dL (ref 8–23)
CO2: 25 mmol/L (ref 22–32)
Calcium: 8.7 mg/dL — ABNORMAL LOW (ref 8.9–10.3)
Chloride: 105 mmol/L (ref 98–111)
Creatinine, Ser: 0.56 mg/dL (ref 0.44–1.00)
GFR, Estimated: >60 mL/min (ref >60)
Glucose, Bld: 90 mg/dL (ref 70–99)
Potassium: 3 mmol/L — ABNORMAL LOW (ref 3.5–5.1)
Sodium: 139 mmol/L (ref 135–145)

## 2020-12-15 MED ORDER — PREDNISONE 20 MG PO TABS: 40.0000 mg | ORAL_TABLET | Freq: Every day | ORAL | 0 refills | Status: DC

## 2020-12-15 MED ORDER — BUDESONIDE 0.5 MG/2ML IN SUSP: 0.5000 mg | Freq: Two times a day (BID) | RESPIRATORY_TRACT | 12 refills | Status: DC

## 2020-12-15 MED ORDER — AMOXICILLIN 500 MG PO TABS: 1000.0000 mg | ORAL_TABLET | Freq: Three times a day (TID) | ORAL | 0 refills | Status: DC

## 2020-12-15 MED ORDER — DOXYCYCLINE MONOHYDRATE 100 MG PO TABS: 100.0000 mg | ORAL_TABLET | Freq: Two times a day (BID) | ORAL | 0 refills | Status: AC

## 2020-12-15 MED ORDER — POTASSIUM CHLORIDE CRYS ER 20 MEQ PO TBCR
40.0000 meq | EXTENDED_RELEASE_TABLET | Freq: Once | ORAL | Status: AC
  Administered 2020-12-15: 40 meq via ORAL
  Filled 2020-12-15: qty 2

## 2020-12-15 MED ORDER — DOXYCYCLINE MONOHYDRATE 100 MG PO TABS: 100.0000 mg | ORAL_TABLET | Freq: Two times a day (BID) | ORAL | 0 refills | Status: DC

## 2020-12-15 MED ORDER — HYDRALAZINE HCL 20 MG/ML IJ SOLN: 5.0000 mg | INTRAMUSCULAR | Status: DC | PRN

## 2020-12-15 MED ORDER — AMOXICILLIN 500 MG PO TABS: 1000.0000 mg | ORAL_TABLET | Freq: Three times a day (TID) | ORAL | 0 refills | Status: AC

## 2020-12-15 NOTE — Discharge Instructions (Signed)
Community-Acquired Pneumonia, Adult Pneumonia is an infection of the lungs. It causes irritation and swelling in the airways of the lungs. Mucus and fluid Rachael Buck also build up inside the airways. This Rachael Buck cause coughing and trouble breathing. One type of pneumonia can happen while you are in a hospital. A different type can happen when you are not in a hospital (community-acquired pneumonia). What are the causes? This condition is caused by germs (viruses, bacteria, or fungi). Some types of germs can spread from person to person. Pneumonia is not thought to spread from person to person.   What increases the risk? You are more likely to develop this condition if:  You have a long-term (chronic) disease, such as: ? Disease of the lungs. This Rachael Buck be chronic obstructive pulmonary disease (COPD) or asthma. ? Heart failure. ? Cystic fibrosis. ? Diabetes. ? Kidney disease. ? Sickle cell disease. ? HIV.  You have other health problems, such as: ? Your body's defense system (immune system) is weak. ? A condition that Rachael Buck cause you to breathe in fluids from your mouth and nose.  You had your spleen taken out.  You do not take good care of your teeth and mouth (poor dental hygiene).  You use or have used tobacco products.  You travel where the germs that cause this illness are common.  You are near certain animals or the places they live.  You are older than 79 years of age. What are the signs or symptoms? Symptoms of this condition include:  A cough.  A fever.  Sweating or chills.  Chest pain, often when you breathe deeply or cough.  Breathing problems, such as: ? Fast breathing. ? Trouble breathing. ? Shortness of breath.  Feeling tired (fatigued).  Muscle aches. How is this treated? Treatment for this condition depends on many things, such as:  The cause of your illness.  Your medicines.  Your other health problems. Most adults can be treated at home. Sometimes,  treatment must happen in a hospital.  Treatment Rachael Buck include medicines to kill germs.  Medicines Rachael Buck depend on which germ caused your illness. Very bad pneumonia is rare. If you get it, you Rachael Buck:  Have a machine to help you breathe.  Have fluid taken away from around your lungs. Follow these instructions at home: Medicines  Take over-the-counter and prescription medicines only as told by your doctor.  Take cough medicine only if you are losing sleep. Cough medicine can keep your body from taking mucus away from your lungs.  If you were prescribed an antibiotic medicine, take it as told by your doctor. Do not stop taking the antibiotic even if you start to feel better. Lifestyle  Do not drink alcohol.  Do not use any products that contain nicotine or tobacco, such as cigarettes, e-cigarettes, and chewing tobacco. If you need help quitting, ask your doctor.  Eat a healthy diet. This includes a lot of vegetables, fruits, whole grains, low-fat dairy products, and low-fat (lean) protein.      General instructions  Rest a lot. Sleep for at least 8 hours each night.  Sleep with your head and neck raised. Put a few pillows under your head or sleep in a reclining chair.  Return to your normal activities as told by your doctor. Ask your doctor what activities are safe for you.  Drink enough fluid to keep your pee (urine) pale yellow.  If your throat is sore, rinse your mouth often with salt water. To make salt   water, dissolve -1 tsp (3-6 g) of salt in 1 cup (237 mL) of warm water.  Keep all follow-up visits as told by your doctor. This is important.   How is this prevented? You can lower your risk of pneumonia by:  Getting the pneumonia shot (vaccine). These shots have different types and schedules. Ask your doctor what works best for you. Think about getting this shot if: ? You are older than 79 years of age. ? You are 19-65 years of age and:  You are being treated for  cancer.  You have long-term lung disease.  You have other problems that affect your body's defense system. Ask your doctor if you have one of these.  Getting your flu shot every year. Ask your doctor which type of shot is best for you.  Going to the dentist as often as told.  Washing your hands often with soap and water for at least 20 seconds. If you cannot use soap and water, use hand sanitizer. Contact a doctor if:  You have a fever.  You lose sleep because your cough medicine does not help. Get help right away if:  You are short of breath and this gets worse.  You have more chest pain.  Your sickness gets worse. This is very serious if: ? You are an older adult. ? Your body's defense system is weak.  You cough up blood. These symptoms Rachael Buck be an emergency. Do not wait to see if the symptoms will go away. Get medical help right away. Call your local emergency services (911 in the [place] Do not drive yourself to the hospital. Summary  Pneumonia is an infection of the lungs.  Community-acquired pneumonia affects people who have not been in the hospital. Certain germs can cause this infection.  This condition [name] be treated with medicines that kill germs.  For very bad pneumonia, you [name] need a hospital stay and treatment to help with breathing. This information is not intended to replace advice given to you by your health care provider. Make sure you discuss any questions you have with your health care provider. Document Revised: 05/29/2019 Document Reviewed: 05/29/2019 Elsevier Patient Education  2021 [employer]  

## 2020-12-15 NOTE — Progress Notes (Signed)
Physical Therapy Treatment Patient Details Name: Rachael Buck MRN: [id number] DOB: 1940-11-01 Today's Date: 12/15/2020    History of Present Illness 80 y.o. female presents to [facility] ED on 12/09/2020 with reports of SOB and cough. Pt admitted for sepsis 2/2 PNA. On 4/13 pt with worsening consolidation on CXR, pt requiring BiPAP due to worsening respiratory status. PMH includes NASH w/ Cirrhosis, Diabetes, HTN, GERD, HL, Morbid Obesity, Asthma, anxiety disorder.    PT Comments    Patient progressing well towards PT goals. Reports feeling better today. Improved ambulation distance with min guard assist holding onto IV pole for support per request. Declines using RW for support with gait. Noted to have 2-3/4 DOE and a longer standing rest break. Sp02 dropped to 87% on RA, donned 2L 02 and able to maintain >92% throughout rest of activity. Limited by cardiovascular endurance and dyspnea. Will need supplemental 02 at home likely short term. Will follow and progress as tolerated.   Follow Up Recommendations  Home health PT;Supervision - Intermittent     Equipment Recommendations  None recommended by PT    Recommendations for Other Services       Precautions / Restrictions Precautions Precautions: Fall;Other (comment) Precaution Comments: monitor 02 Restrictions Weight Bearing Restrictions: No    Mobility  Bed Mobility Overal bed mobility: Needs Assistance Bed Mobility: Supine to Sit     Supine to sit: Modified independent (Device/Increase time);HOB elevated     General bed mobility comments: No assist needed, use of rail.    Transfers Overall transfer level: Needs assistance Equipment used: None Transfers: Sit to/from Stand Sit to Stand: Min guard         General transfer comment: Min guard for safety. Stood from [employer], from toilet x1.  Declines use of RW for support.  Ambulation/Gait Ambulation/Gait assistance: Min guard Gait Distance (Feet): 100 Feet Assistive  device: IV Pole Gait Pattern/deviations: Step-through pattern;Decreased stride length Gait velocity: decreased   General Gait Details: Slow, mostly steady gait holding onto IV pole for support; 2-3/4 DOE 1 longer standing rest break. Sp02 dropped to 87% on RA, donned 2L and able to maintain >92%.   Stairs             Wheelchair Mobility    Modified Rankin (Stroke Patients Only)       Balance Overall balance assessment: Needs assistance Sitting-balance support: No upper extremity supported;Feet supported Sitting balance-Leahy Scale: Good     Standing balance support: During functional activity Standing balance-Leahy Scale: Fair Standing balance comment: Able to stand at sink and wash hands without difficulty reaching outside BoS, does better with UE support for longer walking distances.                            Cognition Arousal/Alertness: Awake/alert Behavior During Therapy: WFL for tasks assessed/performed Overall Cognitive Status: Within Functional Limits for tasks assessed                                 General Comments: appears at baseline cognition today.      Exercises      General Comments General comments (skin integrity, edema, etc.): Family present during session- son, daughter and spouse. Sp02 dropped to 87% on RA, donned 2L 02 and able to maintain >92% throughout.      Pertinent Vitals/Pain Pain Assessment: No/denies pain    Home Living  Prior Function            PT Goals (current goals can now be found in the care plan section) Progress towards PT goals: Progressing toward goals    Frequency    Min 3X/week      PT Plan Current plan remains appropriate    Co-evaluation              AM-PAC PT "6 Clicks" Mobility   Outcome Measure  Help needed turning from your back to your side while in a flat bed without using bedrails?: None Help needed moving from lying on your  back to sitting on the side of a flat bed without using bedrails?: A Little Help needed moving to and from a bed to a chair (including a wheelchair)?: A Little Help needed standing up from a chair using your arms (e.g., wheelchair or bedside chair)?: A Little Help needed to walk in hospital room?: A Little Help needed climbing 3-5 steps with a railing? : A Little 6 Click Score: 19    End of Session Equipment Utilized During Treatment: Oxygen Activity Tolerance: Treatment limited secondary to medical complications (Comment) (drop in Sp02) Patient left: in bed;with call bell/phone within reach;with family/visitor present (sitting EOB) Nurse Communication: Mobility status;Other (comment) (need for 02) PT Visit Diagnosis: Other abnormalities of gait and mobility (R26.89);Muscle weakness (generalized) (M62.81)     Time: 3016-0109 PT Time Calculation (min) (ACUTE ONLY): 21 min  Charges:  $Gait Training: 8-22 mins                     Marisa Severin, PT, DPT Acute Rehabilitation Services Pager [phone] Office [place] 12/15/2020, 12:02 PM

## 2020-12-15 NOTE — TOC Transition Note (Signed)
Transition of Care (TOC) - CM/SW Discharge Note Marvetta Gibbons RN, BSN Transitions of Care Unit 4E- RN Case Manager See Treatment Team for direct phone # Cross Coverage for [place]   Patient Details  Name: Rachael Buck MRN: [id number] Date of Birth: Jun 20, 1941  Transition of Care [facility]) CM/SW Contact:  Dawayne Patricia, RN Phone Number: 12/15/2020, 2:01 PM   Clinical Narrative:    Pt stable for transition home today, noted pt will need home 02, have requested DME and HH orders from MD. CM in to speak with pt and family at the bedside to discuss transition needs. Choice offered for [facility] services with list provided to pt Per CMS guidelines from medicare.gov website with star ratings (copy placed in shadow chart) - per pt her first choice is [facility] with second choice as [facility]. Per pt she is agreeable to HHPT, declines aide and RT needs.  Pt does not have DME needs other than needs home 02 - per pt she does not have a preference for DME agency and is agreeable to in house provider [facility]. Will have portable transport tank delivered to room to transport home with.   Address, phone # and PCP all confirmed with pt in epic.   Spoke with Thedore Mins at [facility] for home 02 needs- once order processed and approved w/ insurance- they will deliver portable tank to room for transport home.   Call made to [facility] with [facility] for [facility] referral- however [facility] unable to accept at this time. Call made to [facility] with Alvis Lemmings who is able to accept the referral for HHPT needs.   Pt will be able to d/c home once portable 02 tank has been delivered by [facility] to the room.   Final next level of care: [place] Barriers to Discharge: No Barriers Identified   Patient Goals and CMS Choice Patient states their goals for this hospitalization and ongoing recovery are:: return home CMS Medicare.gov Compare Post Acute Care list provided to:: Patient Choice offered to / list presented to : [employer]  Discharge Placement                  Home with [facility]      Discharge Plan and Services   Discharge Planning Services: CM Consult Post Acute Care Choice: Home Health,Durable Medical Equipment          DME Arranged: Oxygen DME Agency: [facility] Date DME Agency Contacted: 12/15/20 Time DME Agency Contacted: 66 Representative spoke with at DME Agency: [place]: PT,Nurse's [place]: [place] Date [place]: 12/15/20 Time [place]: 1401 Representative spoke with at [place]: [place] Interventions     Readmission Risk Interventions Readmission Risk Prevention Plan 12/15/2020  Transportation Screening Complete  PCP or Specialist Appt within 5-7 Days Complete  Home Care Screening Complete  Medication Review (RN CM) Complete  Some recent data might be hidden

## 2020-12-15 NOTE — Care Management Important Message (Signed)
Important Message  Patient Details  Name: Rachael Buck MRN: [id number] Date of Birth: March 07, 1941   Medicare Important Message Given:  Yes     Shelda Altes 12/15/2020, 12:22 PM

## 2020-12-15 NOTE — Progress Notes (Signed)
Prescriptions sent to pharmacy in [place] in [place], [place].

## 2020-12-15 NOTE — Progress Notes (Addendum)
@  0505, Dr. Marlowe Sax, on-call for attending, text-paged regarding pt's SBPs in 170s bilaterally. No orders received, will continue to monitor and convey to Day RN for additional review by Day Team.  Update: PRN IV Hydralazine ordered, but upon rechecking BP prior to administering, SBP in 150s. Will convey to Day RN and continue to monitor.

## 2020-12-15 NOTE — Discharge Summary (Addendum)
Physician Discharge Summary  Rachael Buck:[id number] DOB: March 24, 1941 DOA: 12/09/2020  PCP: [name], MD  Admit date: 12/09/2020 Discharge date: 12/15/2020  Admitted From: Home Disposition:  Home with [place] and PT  Recommendations for Outpatient Follow-up:  1. Follow up with PCP in 1-2 weeks 2. Follow up with Pulmonary - family has appointment scheduled. 3. Follow up with Oncologist Dr. Marin Olp  Home Health: [facility] Aide and PT Equipment/Devices: Home Oxygen 2L [place]  Discharge Condition: Stable Code Status:   Code Status: Full Code Diet recommendation:  Diet Order            Diet - low sodium heart healthy           Diet Carb Modified Fluid consistency: Thin; Room service appropriate? 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  CTA was negative for PE but showed extensive RML and RUL infiltrates with air bronchograms consistent with pneumonia.  She was evaluated by Pulmonary and treated with 5 days of antibiotics, nebulizer treatments, and steroids.  Her symptoms improved.    Acute Hypoxic Respiratory Failure - combination of Asthma/COPD and Severe Pneumonia in the setting of Morbid Obesity: - Oxygen requirements improved from 4L [place] to room air.  However, patient desaturated to 87% on her ambulatory pulse ox and was discharged with home oxygen.  Patient will follow up with a Pulmonologist outpatient.  Post Viral Community Acquired Pneumonia:  - Chest CT showed severe RUL and RML infiltrates.  She received 5 days of Zosyn and Azithromycin per Pulmonary recommendations.  She  was discharged on Amoxicillin 1g TID and Doxycycline 100 mg BID to complete 10 day course.  Asthma/COPD, acute exacerbation: - Patient was treated with nebulizers and steroids.  She was discharged on short course of Prednisone 40 mg daily to complete 7 day course.  Medial Right Subhilar Soft Tissue Density, unclear etiology: - Patient will need repeat Chest CT in 3 months.  She follows with Oncologist Dr. Marin Olp.  Diabetes Mellitus Type 2: - I recommended GLP1 to assist with weight loss.  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  Suspected Obstructive Sleep Apnea:  - Patient's husband reports wife has difficulty breathing at night.  I recommended sleep study outpatient.    Discharge Diagnoses:  Principal Problem:   Severe sepsis ([place]) Active Problems:   Uncontrolled diabetes mellitus (HCC)   Obesity, unspecified   HTN (hypertension)   Cirrhosis of liver (HCC)   Lobar pneumonia ([place])   Hypokalemia   Community acquired pneumonia of right lower lobe of lung   Consults:  Pulmonary  Subjective: Patient was stable on day of discharge. Discharge Exam: Vitals:   12/15/20 0815 12/15/20 1255  BP: (!) 153/61 (!) 169/74  Pulse: 91 98  Resp: 15 16  Temp: 98.4 F (36.9 C) 98.4 F (36.9 C)  SpO2: 93% 96%   General: Pt is alert, awake, not in acute distress Cardiovascular: RRR, S1/S2 +, no rubs, no gallops  Respiratory: CTA bilaterally, no wheezing, no rhonchi Abdominal: Soft, NT, ND, bowel sounds + Extremities: no edema, no cyanosis  Discharge Instructions  Discharge Instructions    Ambulatory referral to [place]   Complete by: As directed    Please evaluate ARCADIA GORGAS for admission to [facility].  Disciplines requested: Physical Therapy and Home Health Aide  Services to provide: Oxygen Via Nasal Cannula at 2 liters/minute  and Strengthening Exercises  Physician to follow patient's care (the person listed here will be responsible for signing ongoing orders): PCP  Requested Start of Care Date: Within 2-3 days  I certify that this patient is under my care and that I, or a Nurse Practitioner or Physician's Assistant working with me, had a face-to-face encounter that meets the physician face-to-face requirements with patient on 4/18. The encounter with the patient was in whole, or in part for the following medical condition(s) which is the primary reason for home health care (Advanced COPD).   Does the patient have Medicare or Medicaid?: Yes   The encounter with the patient was in whole, or in part, for the following medical condition, which is the primary reason for home health care: Skilled Nursing and Physical Therapy   Reason for Medically [place]: Other See Comments   My clinical findings support the need for the above services: OTHER SEE COMMENTS   I certify that, based on my findings, the following services are medically necessary home health services:  Nursing Physical therapy     Further, I certify that my clinical findings support that this patient is homebound due to: Shortness of Breath with activity   Diet - low sodium heart healthy   Complete by: As directed    For home use only DME oxygen   Complete by: As directed    Length of Need: 6 Months   Mode or (Route): Nasal cannula   Liters per Minute: 2   Oxygen delivery system: Gas   Increase activity slowly   Complete by: As directed    Increase activity slowly   Complete by: As directed      Allergies as of 12/15/2020      Reactions   Codeine Nausea And Vomiting   Metformin And Related Diarrhea   Shrimp [shellfish Allergy] Swelling      Medication List    TAKE these medications   acetaminophen 500 MG tablet Commonly known as: TYLENOL Take 500 mg by mouth every 6 (six) hours as needed for mild pain.   albuterol 108  (90 Base) MCG/ACT inhaler Commonly known as: ProAir HFA Inhale 2 puffs into the lungs every 4 (four) hours as needed for wheezing or shortness of breath.   ALPRAZolam 0.5 MG tablet Commonly known as: XANAX TAKE 1 TABLET(0.5 MG) BY MOUTH THREE TIMES DAILY What changed: See the new instructions.   amoxicillin 500 MG tablet Commonly known as: AMOXIL Take 2 tablets (1,000 mg total) by mouth in the morning, at noon, and at bedtime for 5 days.   blood glucose meter kit and supplies Kit Dispense based on patient and insurance preference. Use up to four times daily as directed.   budesonide 0.5 MG/2ML nebulizer solution Commonly known as: PULMICORT Take 2 mLs (0.5 mg total) by nebulization 2 (two) times daily.   colestipol 1 g tablet Commonly known as: COLESTID Take 1 g by mouth daily.   dicyclomine 20 MG tablet Commonly known as: BENTYL Take 1 tablet (20 mg total) by mouth 3 (three)  times daily as needed. What changed: reasons to take this   diphenhydrAMINE 25 MG tablet Commonly known as: BENADRYL Take 25 mg by mouth every 6 (six) hours as needed (for allergies.).   doxycycline 100 MG tablet Commonly known as: ADOXA Take 1 tablet (100 mg total) by mouth 2 (two) times daily for 5 days.   empagliflozin 10 MG Tabs tablet Commonly known as: JARDIANCE Take 10 mg by mouth daily.   glipiZIDE 5 MG 24 hr tablet Commonly known as: GLUCOTROL XL Take 1 tablet (5 mg total) by mouth daily with breakfast.   hydrochlorothiazide 25 MG tablet Commonly known as: HYDRODIURIL TAKE 1 TABLET(25 MG) BY MOUTH DAILY What changed: See the new instructions.   hydrocortisone 2.5 % cream Apply 1 application topically daily as needed (itchy dry skin).   loperamide 2 MG capsule Commonly known as: IMODIUM Take 4-6 mg by mouth daily as needed (for IBS symptoms).   Melatonin 10 MG Tabs Take 10 mg by mouth at bedtime.   omeprazole 40 MG capsule Commonly known as: PRILOSEC TAKE 1 CAPSULE(40 MG) BY  MOUTH DAILY What changed: See the new instructions.   OneTouch Delica Lancets Fine Misc 1 each by Other route daily.   OneTouch Ultra test strip Generic drug: glucose blood TEST BLOOD SUGAR(S) ONCE DAILY IN THE MORNING   potassium chloride SA 20 MEQ tablet Commonly known as: Klor-Con M20 Take 1 tablet (20 mEq total) by mouth daily.   predniSONE 20 MG tablet Commonly known as: DELTASONE Take 2 tablets (40 mg total) by mouth daily with breakfast. Start taking on: December 16, 2020 Notes to patient: For five days then stop   sertraline 100 MG tablet Commonly known as: ZOLOFT Take 1 tablet (100 mg total) by mouth daily.   simvastatin 80 MG tablet Commonly known as: ZOCOR TAKE 1 TABLET(80 MG) BY MOUTH DAILY What changed: See the new instructions.   traMADol 50 MG tablet Commonly known as: ULTRAM Take 1 tablet (50 mg total) by mouth every 6 (six) hours as needed. What changed: reasons to take this            Durable Medical Equipment  (From admission, onward)         Start     Ordered   12/15/20 1232  DME Oxygen  Once       Question Answer Comment  Length of Need 6 Months   Mode or (Route) Nasal cannula   Liters per Minute 2   Frequency Continuous (stationary and portable oxygen unit needed)   Oxygen delivery system Gas      12/15/20 1232   12/15/20 0000  For home use only DME oxygen       Question Answer Comment  Length of Need 6 Months   Mode or (Route) Nasal cannula   Liters per Minute 2   Oxygen delivery system Gas      12/15/20 1151          Follow-up Information    Collene Gobble, MD Follow up on 01/13/2021.   Specialty: Pulmonary Disease Why: 1:30pm Contact information: [place] [address] [phone]        [facility] Follow up.   Why: home 02 arranged- transport tank to be delivered to room prior to discharge Contact information: [place] [address] [phone]         [facility] Follow up.   Specialty: Home Health Services Why:  HHPT arranged- they will contact you to set up home visits Contact information: [address] [place] [address] [phone]              Allergies  Allergen Reactions  . Codeine Nausea And Vomiting  . Metformin And Related Diarrhea  . Shrimp [Shellfish Allergy] Swelling    The results of significant diagnostics from this hospitalization (including imaging, microbiology, ancillary and laboratory) are listed below for reference.    Microbiology: Recent Results (from the past 240 hour(s))  Resp Panel by RT-PCR (Flu A&B, Covid) Nasopharyngeal Swab     Status: None   Collection Time: 12/09/20  5:15 PM   Specimen: Nasopharyngeal Swab; Nasopharyngeal(NP) swabs in vial transport medium  Result Value Ref Range Status   SARS Coronavirus 2 by RT PCR NEGATIVE NEGATIVE Final    Comment: (NOTE) SARS-CoV-2 target nucleic acids are NOT DETECTED.  The SARS-CoV-2 RNA is generally detectable in upper respiratory specimens during the acute phase of infection. The lowest concentration of SARS-CoV-2 viral copies this assay can detect is 138 copies/mL. A negative result does not preclude SARS-Cov-2 infection and should not be used as the sole basis for treatment or other patient management decisions. A negative result may occur with  improper specimen collection/handling, submission of specimen other than nasopharyngeal swab, presence of viral mutation(s) within the areas targeted by this assay, and inadequate number of viral copies(<138 copies/mL). A negative result must be combined with clinical observations, patient history, and epidemiological information. The expected result is Negative.  Fact Sheet for Patients:  EntrepreneurPulse.com.au  Fact Sheet for Healthcare Providers:  IncredibleEmployment.be  This test is no t yet approved or cleared by the [place] FDA and  has been authorized for detection and/or diagnosis of SARS-CoV-2 by FDA under an Emergency Use Authorization (EUA). This EUA will remain  in effect (meaning this test can be used) for the duration of the COVID-19 declaration under Section 564(b)(1) of the Act, 21 U.S.C.section 360bbb-3(b)(1), unless the authorization is terminated  or revoked sooner.       Influenza A by PCR NEGATIVE NEGATIVE Final   Influenza B by PCR NEGATIVE NEGATIVE Final    Comment: (NOTE) The Xpert Xpress SARS-CoV-2/FLU/RSV plus assay is intended as an aid in the diagnosis of influenza from Nasopharyngeal swab specimens and should not be used as a sole basis for treatment. Nasal washings and aspirates are unacceptable for Xpert Xpress SARS-CoV-2/FLU/RSV testing.  Fact Sheet for Patients: EntrepreneurPulse.com.au  Fact Sheet for Healthcare Providers: IncredibleEmployment.be  This test is not yet approved or cleared by the [place] FDA and has been authorized for detection and/or diagnosis of SARS-CoV-2 by FDA under an Emergency Use Authorization (EUA). This EUA will remain in effect (meaning this test can be used) for the duration of the COVID-19 declaration under Section 564(b)(1) of the Act, 21 U.S.C. section 360bbb-3(b)(1), unless the authorization is terminated or revoked.  Performed at [facility], [place] [address], [place], [place] [address]   Blood culture (routine x 2)     Status: None   Collection Time: 12/09/20  5:36 PM   Specimen: BLOOD  Result Value Ref Range Status   Specimen Description BLOOD SITE NOT SPECIFIED  Final   Special Requests   Final    BOTTLES DRAWN AEROBIC AND ANAEROBIC Blood Culture adequate volume   Culture   Final    NO GROWTH 5 DAYS Performed at [facility], [address], [place], [place]  [address]    Report Status 12/14/2020 FINAL  Final  Blood culture (routine x 2)     Status: None   Collection  Time: 12/09/20  9:45 PM   Specimen: BLOOD RIGHT WRIST  Result Value Ref Range Status   Specimen Description BLOOD RIGHT WRIST  Final   Special Requests   Final    BOTTLES DRAWN AEROBIC AND ANAEROBIC Blood Culture adequate volume   Culture   Final    NO GROWTH 5 DAYS Performed at [facility], [place] [address], [place], [place] [address]    Report Status 12/14/2020 FINAL  Final  MRSA PCR Screening     Status: None   Collection Time: 12/10/20 11:26 AM   Specimen: Nasal Mucosa; Nasopharyngeal  Result Value Ref Range Status   MRSA by PCR NEGATIVE NEGATIVE Final    Comment:        The GeneXpert MRSA Assay (FDA approved for NASAL specimens only), is one component of a comprehensive MRSA colonization surveillance program. It is not intended to diagnose MRSA infection nor to guide or monitor treatment for MRSA infections. Performed at [facility], [place] [address], [place], [place] [address]     Procedures/Studies: CT Angio Chest PE W and/or Wo Contrast  Result Date: 12/09/2020 CLINICAL DATA:  Shortness of breath. EXAM: CT ANGIOGRAPHY CHEST WITH CONTRAST TECHNIQUE: Multidetector CT imaging of the chest was performed using the standard protocol during bolus administration of intravenous contrast. Multiplanar CT image reconstructions and MIPs were obtained to evaluate the vascular anatomy. CONTRAST:  49m OMNIPAQUE IOHEXOL 350 MG/ML SOLN COMPARISON:  None. FINDINGS: Cardiovascular: There is marked severity calcification of the aortic arch and descending thoracic aorta, without evidence of aneurysmal dilatation. Satisfactory opacification of the pulmonary arteries to the segmental level. No evidence of pulmonary embolism. Normal heart size with moderate severity coronary artery calcification. No pericardial effusion. Mediastinum/Nodes: No enlarged mediastinal, hilar, or axillary lymph nodes. Thyroid gland, trachea, and esophagus demonstrate no significant findings. Lungs/Pleura: Marked  severity infiltrate is seen throughout the right middle lobe and posterior aspect of the right upper lobe. Mild, posterior right basilar atelectasis and/or infiltrate is seen. A 1.9 cm x 1.3 cm noncalcified, nonenhancing soft tissue nodule is seen along the infrahilar aspect of the right lower lobe (axial CT images 57 through 62, CT series number 5). There is a small right pleural effusion. No pneumothorax is identified. Upper Abdomen: The liver is cirrhotic in appearance. Surgical clips are noted within the gallbladder fossa. Musculoskeletal: Degenerative changes are seen throughout the thoracic spine. Review of the MIP images confirms the above findings. IMPRESSION: 1. Marked severity right middle lobe and right upper lobe infiltrate. 2. Mild right basilar atelectasis and/or infiltrate. 3. Small right pleural effusion. 4. 1.9 cm x 1.3 cm right infrahilar soft tissue nodule. Consider one of the following in 3 months for both low-risk and high-risk individuals: (a) repeat chest CT, (b) follow-up PET-CT, or (c) tissue sampling. This recommendation follows the consensus statement: Guidelines for Management of Incidental Pulmonary Nodules Detected on CT Images: From the Fleischner Society 2017; Radiology 2017; 284:228-243. 5. Cirrhotic liver. 6. Aortic atherosclerosis. Aortic Atherosclerosis (ICD10-I70.0). Electronically Signed   By: TVirgina NorfolkM.D.   On: 12/09/2020 19:32   DG Chest Port 1 View  Result Date: 12/11/2020 CLINICAL DATA:  Pneumonia EXAM: PORTABLE CHEST 1 VIEW COMPARISON:  12/10/2020 FINDINGS: Focal infiltrate is again seen throughout the right lung, with more focal due consolidation within the right mid lung zone, unchanged  from prior examination. Left lung is clear. No pneumothorax or pleural effusion. Cardiac size is within normal limits. Pulmonary vascularity is normal. IMPRESSION: Stable right lung consolidation. Electronically Signed   By: Fidela Salisbury MD   On: 12/11/2020 06:20   DG  Chest Port 1 View  Result Date: 12/10/2020 CLINICAL DATA:  Shortness of breath in a patient with pneumonia. EXAM: PORTABLE CHEST 1 VIEW COMPARISON:  Single-view of the chest and CT chest 12/09/2020. FINDINGS: Airspace disease throughout the right chest has worsened with marked increase in the density of airspace disease in the right mid lung zone. The left lung remains clear. Heart size is upper normal. Aortic atherosclerosis. No pneumothorax or pleural fluid. IMPRESSION: Worsened airspace disease in the right chest consistent with progressive pneumonia. Electronically Signed   By: Inge Rise M.D.   On: 12/10/2020 09:43   DG Chest Port 1 View  Result Date: 12/09/2020 CLINICAL DATA:  Short of breath for 3 days, history of asthma and diabetes EXAM: PORTABLE CHEST 1 VIEW COMPARISON:  12/07/2019 FINDINGS: 2 frontal views of the chest demonstrate an unremarkable cardiac silhouette. There is diffuse right-sided airspace disease greatest in the mid and lower lung zones, likely involving the right middle and right lower lobes. No large effusion or pneumothorax. No acute bony abnormalities. IMPRESSION: 1. Significant right-sided airspace disease consistent with right middle and right lower lobe pneumonia. Followup PA and lateral chest X-ray is recommended in 3-4 weeks following trial of antibiotic therapy to ensure resolution and exclude underlying malignancy. Electronically Signed   By: Randa Ngo M.D.   On: 12/09/2020 17:06     Labs: Basic Metabolic Panel: Recent Labs  Lab 12/09/20 1638 12/09/20 1720 12/10/20 0241 12/11/20 0255 12/11/20 1436 12/12/20 0350 12/13/20 0141 12/14/20 0229 12/15/20 0521  NA 136   < > 136 138  --  136 139 141 139  K 2.3*   < > 3.2* 2.9* 4.3 3.6 3.3* 3.8 3.0*  CL 99   < > 100 103  --  105 106 107 105  CO2 21*  --  25 26  --  _0 GLUCOSE 129*   < > 168* 105*  --  85 108* 103* 90  BUN 16   < > 17 19  --  _1 CREATININE 1.25*   < > 0.94 0.73  --   0.63 0.53 0.47 0.56  CALCIUM 8.4*  --  8.3* 8.3*  --  8.2* 8.3* 8.5* 8.7*  MG 1.2*  --  1.8  --   --   --   --   --   --    < > = values in this interval not displayed.   Liver Function Tests: Recent Labs  Lab 12/09/20 1638 12/10/20 0241 12/14/20 0229  AST 34 30 20  ALT _2 ALKPHOS 51 43 44  BILITOT 1.8* 1.8* 1.0  PROT 6.2* 6.6 6.2*  ALBUMIN 3.2* 3.3* 2.7*   CBC: Recent Labs  Lab 12/09/20 1638 12/09/20 1720 12/11/20 0255 12/12/20 0350 12/13/20 0141 12/14/20 0229 12/15/20 0521  WBC 28.9*   < > 15.6* 12.2* 7.5 7.3 7.4  NEUTROABS 25.2*  --   --   --   --  5.3 5.0  HGB 12.3   < > 9.5* 9.3* 9.8* 9.9* 10.3*  HCT 38.2   < > 29.1* 28.9* 30.4* 31.3* 32.7*  MCV 84.1   < > 84.1 84.0 84.2 85.8 84.7  PLT 82*   < >  58* 52* 55* 56* 68*   < > = values in this interval not displayed.   CBG: Recent Labs  Lab 12/14/20 1145 12/14/20 1702 12/14/20 2002 12/15/20 0818 12/15/20 1205  GLUCAP 193* 157* 133* 127* 184*   Urinalysis    Component Value Date/Time   COLORURINE YELLOW 12/07/2019 1245   APPEARANCEUR CLEAR 12/07/2019 1245   LABSPEC 1.010 12/07/2019 1245   PHURINE 7.0 12/07/2019 1245   GLUCOSEU >=500 (A) 12/07/2019 1245   HGBUR NEGATIVE 12/07/2019 1245   BILIRUBINUR NEGATIVE 12/07/2019 1245   BILIRUBINUR negative 09/20/2017 1009   KETONESUR NEGATIVE 12/07/2019 1245   PROTEINUR NEGATIVE 12/07/2019 1245   UROBILINOGEN 0.2 09/20/2017 1009   UROBILINOGEN 1.0 12/04/2013 1401   NITRITE NEGATIVE 12/07/2019 1245   LEUKOCYTESUR TRACE (A) 12/07/2019 1245   Sepsis Labs Invalid input(s): PROCALCITONIN,  WBC,  LACTICIDVEN Microbiology Recent Results (from the past 240 hour(s))  Resp Panel by RT-PCR (Flu A&B, Covid) Nasopharyngeal Swab     Status: None   Collection Time: 12/09/20  5:15 PM   Specimen: Nasopharyngeal Swab; Nasopharyngeal(NP) swabs in vial transport medium  Result Value Ref Range Status   SARS Coronavirus 2 by RT PCR NEGATIVE NEGATIVE Final    Comment:  (NOTE) SARS-CoV-2 target nucleic acids are NOT DETECTED.  The SARS-CoV-2 RNA is generally detectable in upper respiratory specimens during the acute phase of infection. The lowest concentration of SARS-CoV-2 viral copies this assay can detect is 138 copies/mL. A negative result does not preclude SARS-Cov-2 infection and should not be used as the sole basis for treatment or other patient management decisions. A negative result may occur with  improper specimen collection/handling, submission of specimen other than nasopharyngeal swab, presence of viral mutation(s) within the areas targeted by this assay, and inadequate number of viral copies(<138 copies/mL). A negative result must be combined with clinical observations, patient history, and epidemiological information. The expected result is Negative.  Fact Sheet for Patients:  EntrepreneurPulse.com.au  Fact Sheet for Healthcare Providers:  IncredibleEmployment.be  This test is no t yet approved or cleared by the [place] FDA and  has been authorized for detection and/or diagnosis of SARS-CoV-2 by FDA under an Emergency Use Authorization (EUA). This EUA will remain  in effect (meaning this test can be used) for the duration of the COVID-19 declaration under Section 564(b)(1) of the Act, 21 U.S.C.section 360bbb-3(b)(1), unless the authorization is terminated  or revoked sooner.       Influenza A by PCR NEGATIVE NEGATIVE Final   Influenza B by PCR NEGATIVE NEGATIVE Final    Comment: (NOTE) The Xpert Xpress SARS-CoV-2/FLU/RSV plus assay is intended as an aid in the diagnosis of influenza from Nasopharyngeal swab specimens and should not be used as a sole basis for treatment. Nasal washings and aspirates are unacceptable for Xpert Xpress SARS-CoV-2/FLU/RSV testing.  Fact Sheet for Patients: EntrepreneurPulse.com.au  Fact Sheet for Healthcare  Providers: IncredibleEmployment.be  This test is not yet approved or cleared by the [place] FDA and has been authorized for detection and/or diagnosis of SARS-CoV-2 by FDA under an Emergency Use Authorization (EUA). This EUA will remain in effect (meaning this test can be used) for the duration of the COVID-19 declaration under Section 564(b)(1) of the Act, 21 U.S.C. section 360bbb-3(b)(1), unless the authorization is terminated or revoked.  Performed at [facility], [place] [address], [place], [place] [address]   Blood culture (routine x 2)     Status: None   Collection Time: 12/09/20  5:36  PM   Specimen: BLOOD  Result Value Ref Range Status   Specimen Description BLOOD SITE NOT SPECIFIED  Final   Special Requests   Final    BOTTLES DRAWN AEROBIC AND ANAEROBIC Blood Culture adequate volume   Culture   Final    NO GROWTH 5 DAYS Performed at [facility], [address] [place], [place] [address]    Report Status 12/14/2020 FINAL  Final  Blood culture (routine x 2)     Status: None   Collection Time: 12/09/20  9:45 PM   Specimen: BLOOD RIGHT WRIST  Result Value Ref Range Status   Specimen Description BLOOD RIGHT WRIST  Final   Special Requests   Final    BOTTLES DRAWN AEROBIC AND ANAEROBIC Blood Culture adequate volume   Culture   Final    NO GROWTH 5 DAYS Performed at [facility], [place] [address], [place], [place] [address]    Report Status 12/14/2020 FINAL  Final  MRSA PCR Screening     Status: None   Collection Time: 12/10/20 11:26 AM   Specimen: Nasal Mucosa; Nasopharyngeal  Result Value Ref Range Status   MRSA by PCR NEGATIVE NEGATIVE Final    Comment:        The GeneXpert MRSA Assay (FDA approved for NASAL specimens only), is one component of a comprehensive MRSA colonization surveillance program. It is not intended to diagnose MRSA infection nor to guide or monitor treatment for MRSA infections. Performed at [facility], [place] [address], [place], [place] [address]      Time coordinating discharge: 60 minutes  SIGNED: George Hugh, MD  [facility] 12/15/2020, 4:52 PM  If 7PM-7AM, please contact night-coverage [email or web address]

## 2020-12-15 NOTE — Progress Notes (Signed)
SATURATION QUALIFICATIONS: (This note is used to comply with regulatory documentation for home oxygen)  Patient Saturations on Room Air at Rest = 93%  Patient Saturations on Room Air while Ambulating = 87%  Patient Saturations on 2 Liters of oxygen while Ambulating = 95%  Please briefly explain why patient needs home oxygen: Pt not able to maintain SP02 >90% during exertion so will require supplemental 02   Marisa Severin, PT, DPT Acute Rehabilitation Services Pager [phone] Office [phone]

## 2020-12-16 ENCOUNTER — Telehealth: Payer: Self-pay

## 2020-12-16 NOTE — Telephone Encounter (Signed)
Transition Care Management Unsuccessful Follow-up Telephone Call  Date of discharge and from where:  12/15/20-[facility]  Attempts:  1st Attempt  Reason for unsuccessful TCM follow-up call:  No answer/busy

## 2020-12-17 ENCOUNTER — Telehealth: Payer: Self-pay

## 2020-12-17 DIAGNOSIS — A419 Sepsis, unspecified organism: Secondary | ICD-10-CM | POA: Diagnosis not present

## 2020-12-17 DIAGNOSIS — M1711 Unilateral primary osteoarthritis, right knee: Secondary | ICD-10-CM | POA: Diagnosis not present

## 2020-12-17 DIAGNOSIS — K7460 Unspecified cirrhosis of liver: Secondary | ICD-10-CM | POA: Diagnosis not present

## 2020-12-17 DIAGNOSIS — J181 Lobar pneumonia, unspecified organism: Secondary | ICD-10-CM | POA: Diagnosis not present

## 2020-12-17 DIAGNOSIS — E876 Hypokalemia: Secondary | ICD-10-CM | POA: Diagnosis not present

## 2020-12-17 DIAGNOSIS — F32A Depression, unspecified: Secondary | ICD-10-CM | POA: Diagnosis not present

## 2020-12-17 DIAGNOSIS — E119 Type 2 diabetes mellitus without complications: Secondary | ICD-10-CM | POA: Diagnosis not present

## 2020-12-17 DIAGNOSIS — D696 Thrombocytopenia, unspecified: Secondary | ICD-10-CM | POA: Diagnosis not present

## 2020-12-17 DIAGNOSIS — K7581 Nonalcoholic steatohepatitis (NASH): Secondary | ICD-10-CM | POA: Diagnosis not present

## 2020-12-17 DIAGNOSIS — K219 Gastro-esophageal reflux disease without esophagitis: Secondary | ICD-10-CM | POA: Diagnosis not present

## 2020-12-17 DIAGNOSIS — J1289 Other viral pneumonia: Secondary | ICD-10-CM | POA: Diagnosis not present

## 2020-12-17 DIAGNOSIS — J441 Chronic obstructive pulmonary disease with (acute) exacerbation: Secondary | ICD-10-CM | POA: Diagnosis not present

## 2020-12-17 DIAGNOSIS — F419 Anxiety disorder, unspecified: Secondary | ICD-10-CM | POA: Diagnosis not present

## 2020-12-17 DIAGNOSIS — J9601 Acute respiratory failure with hypoxia: Secondary | ICD-10-CM | POA: Diagnosis not present

## 2020-12-17 DIAGNOSIS — I119 Hypertensive heart disease without heart failure: Secondary | ICD-10-CM | POA: Diagnosis not present

## 2020-12-17 DIAGNOSIS — J90 Pleural effusion, not elsewhere classified: Secondary | ICD-10-CM | POA: Diagnosis not present

## 2020-12-17 DIAGNOSIS — M47814 Spondylosis without myelopathy or radiculopathy, thoracic region: Secondary | ICD-10-CM | POA: Diagnosis not present

## 2020-12-17 DIAGNOSIS — R918 Other nonspecific abnormal finding of lung field: Secondary | ICD-10-CM | POA: Diagnosis not present

## 2020-12-17 DIAGNOSIS — D509 Iron deficiency anemia, unspecified: Secondary | ICD-10-CM | POA: Diagnosis not present

## 2020-12-17 DIAGNOSIS — J440 Chronic obstructive pulmonary disease with acute lower respiratory infection: Secondary | ICD-10-CM | POA: Diagnosis not present

## 2020-12-17 DIAGNOSIS — I700 Atherosclerosis of aorta: Secondary | ICD-10-CM | POA: Diagnosis not present

## 2020-12-17 DIAGNOSIS — I2510 Atherosclerotic heart disease of native coronary artery without angina pectoris: Secondary | ICD-10-CM | POA: Diagnosis not present

## 2020-12-17 DIAGNOSIS — I959 Hypotension, unspecified: Secondary | ICD-10-CM | POA: Diagnosis not present

## 2020-12-17 DIAGNOSIS — R161 Splenomegaly, not elsewhere classified: Secondary | ICD-10-CM | POA: Diagnosis not present

## 2020-12-17 DIAGNOSIS — E785 Hyperlipidemia, unspecified: Secondary | ICD-10-CM | POA: Diagnosis not present

## 2020-12-17 NOTE — Telephone Encounter (Signed)
Transition Care Management Follow-up Telephone Call  Date of discharge and from where: 12/15/2020-[facility]  How have you been since you were released from the hospital? Getting a little better each day. Using oxygen as prescribed. Still a little short of breath  Any questions or concerns? No  Items Reviewed:  Did the pt receive and understand the discharge instructions provided? Yes   Medications obtained and verified? Yes   Other? Yes   Any new allergies since your discharge? No   Dietary orders reviewed? Yes  Do you have support at home? Yes   Home Care and Equipment/Supplies: Were home health services ordered? yes If so, what is the name of the agency? [facility]  Has the agency set up a time to come to the patient's home? yes Were any new equipment or medical supplies ordered?  Yes: oxygen What is the name of the medical supply agency? [facility] Were you able to get the supplies/equipment? yes Do you have any questions related to the use of the equipment or supplies? No  Functional Questionnaire: (I = Independent and D = Dependent) ADLs: I with assistance  Bathing/Dressing- I  Meal Prep- I with assistance  Eating- I  Maintaining continence- I  Transferring/Ambulation- I  Managing Meds- I  Follow up appointments reviewed:   PCP Hospital f/u appt confirmed? Yes  Scheduled to see Dr Lorelei Pont on 12/24/2020 @ 11:00.  [facility] f/u appt confirmed? Yes  Scheduled to see Dr. Lamonte Sakai on 01/13/21 @ 1:30.  Are transportation arrangements needed? No   If their condition worsens, is the pt aware to call PCP or go to the Emergency Dept.? Yes  Was the patient provided with contact information for the PCP's office or ED? Yes  Was to pt encouraged to call back with questions or concerns? Yes

## 2020-12-18 DIAGNOSIS — E876 Hypokalemia: Secondary | ICD-10-CM | POA: Diagnosis not present

## 2020-12-18 DIAGNOSIS — J189 Pneumonia, unspecified organism: Secondary | ICD-10-CM | POA: Diagnosis not present

## 2020-12-20 NOTE — Progress Notes (Signed)
[place] at [facility] [address] [place], [place] [address] [phone]  Date:  12/24/2020   Name:  Rachael Buck   DOB:  1940/11/11   MRN:  [id number]  PCP:  [name], MD    Chief Complaint: Hospitalization Follow-up (Follow up, still experiencing weakness, released lat Monday 18th. ) and Pneumonia   History of Present Illness:  Rachael Buck is a 80 y.o. very pleasant female patient who presents with the following:  Here today for hospital follow-up visit.  I last saw her in March for routine follow-up-  history of diabetes, hypertension, IBS, [place], cirrhosis, hyperlipidemia, thrombocytopenia Admit date: 12/09/2020 Discharge date: 12/15/2020 Admitted From: Home Disposition:  Home with [facility] and PT Recommendations for Outpatient Follow-up:  1. Follow up with PCP in 1-2 weeks 2. Follow up with Pulmonary - family has appointment scheduled. 3. Follow up with Oncologist Dr. Marin Olp  Home Health: [facility] Aide and PT Equipment/Devices: Home Oxygen 2L [place] Brief/Interim Summary: 80 year old female with PMH of Hypertension, Diabetes, Asthma/COPD with moderate obstructive disease, Anxiety, and Morbid Obesity (BMI 30 kg/m2) with NASH cirrhosis who presents to the ED with acute progressively worsening shortness of breath with a persistent cough productive of yellow sputum after recovering from a virus one week ago, as well as acute hypoxia requiring 4L [place]. CTA was negative for PE but showed extensive RML and RUL infiltrates with air bronchograms consistent with pneumonia.  She was evaluated by Pulmonary and treated with 5 days of antibiotics, nebulizer treatments, and steroids.  Her symptoms improved.   Acute Hypoxic Respiratory Failure - combination of Asthma/COPD and Severe Pneumonia in the setting of Morbid Obesity: - Oxygen requirements improved from 4L [place] to room air.  However, patient desaturated to 87% on her ambulatory  pulse ox and was discharged with home oxygen.  Patient will follow up with a Pulmonologist outpatient. Post Viral Community Acquired Pneumonia:  - Chest CT showed severe RUL and RML infiltrates.  She received 5 days of Zosyn and Azithromycin per Pulmonary recommendations.  She was discharged on Amoxicillin 1g TID and Doxycycline 100 mg BID to complete 10 day course. Asthma/COPD, acute exacerbation: - Patient was treated with nebulizers and steroids.  She was discharged on short course of Prednisone 40 mg daily to complete 7 day course. Medial Right Subhilar Soft Tissue Density, unclear etiology: - Patient will need repeat Chest CT in 3 months.  She follows with Oncologist Dr. Marin Olp. Diabetes Mellitus Type 2: - I recommended GLP1 to assist with weight loss. 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 Suspected Obstructive Sleep Apnea:  - Patient's husband reports wife has difficulty breathing at night.  I recommended sleep study outpatient.   Discharge Diagnoses:  Principal Problem:   Severe sepsis ([place]) Active Problems:   Uncontrolled diabetes mellitus (HCC)   Obesity, unspecified   HTN (hypertension)   Cirrhosis of liver (HCC)   Lobar pneumonia ([place])   Hypokalemia   Community acquired pneumonia of right lower lobe of lung   Is seen today with her daughter who was assisting her during her illness She is using oxygen 2L 24/7- she may take off when she is at rest and stays 94% or so, but may drop to about 90% other times  Overall she is feeling improved, still somewhat weak but is making progress She  has finished her oral antibiotics  She has not yet started driving again  We discussed plans for an outpatient sleep study in a couple of months, I also ordered a follow-up CT scan of her lungs Patient Active Problem List   Diagnosis Date Noted  .  Community acquired pneumonia of right lower lobe of lung   . Severe sepsis ([place]) 12/09/2020  . Lobar pneumonia ([place]) 12/09/2020  . Hypokalemia 12/09/2020  . Cirrhosis of liver ([place]) 02/17/2018  . 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 Thrombocytopenia ([place]) 02/15/2017  . Varicose veins of left lower extremity with complications 94/49/6759  . Age-related cognitive decline 06/02/2015  . Fatigue 07/19/2014  . Orthostatic hypotension 05/14/2013  . Bronchitis with asthma, acute 01/29/2013  . Nail abnormality 09/04/2012  . Bleeding disorder ([place]) 05/08/2012  . Varices, esophageal ([place]) 03/21/2012  . NASH (nonalcoholic steatohepatitis) 03/21/2012  . HTN (hypertension) 12/21/2011  . Depression 03/28/2011  . IBS (irritable bowel syndrome) 03/23/2011  . INTRINSIC ASTHMA, UNSPECIFIED 08/27/2010  . DYSPNEA ON EXERTION 07/27/2010  . Uncontrolled diabetes mellitus ([place]) 07/24/2010  . MIXED HYPERLIPIDEMIA 07/24/2010  . Obesity, unspecified 07/24/2010  . ALLERGIC RHINITIS CAUSE UNSPECIFIED 07/24/2010  . REFLUX ESOPHAGITIS 07/24/2010  . PAIN IN JOINT PELVIC REGION AND THIGH 07/24/2010    Past Medical History:  Diagnosis Date  . Anxiety   . Arthritis   . Asthma   . Cirrhosis, nonalcoholic ([place])   . Colon polyps   . Diabetes mellitus   . Diverticulitis   . GERD (gastroesophageal reflux disease)   . Hyperlipidemia   . Hypertension   . NASH (nonalcoholic steatohepatitis)   . Obesity   . Pneumonia     Past Surgical History:  Procedure Laterality Date  . CHOLECYSTECTOMY  1981  . ENDOVENOUS ABLATION SAPHENOUS VEIN W/ LASER Left 02/21/2018   endovenous laser ablation left greater saphenous vein by Tinnie Gens MD   . [place]  . PARTIAL HYSTERECTOMY  1972  . ROTATOR CUFF REPAIR     bilateral. 2006, 2008  . 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 TOTAL KNEE ARTHROPLASTY Left 02/18/2017   Procedure: LEFT TOTAL KNEE ARTHROPLASTY;  Surgeon: Netta Cedars, MD;   Location: [facility];  Service: Orthopedics;  Laterality: Left;  Marland Kitchen VEIN SURGERY      Social History   Tobacco Use  . Smoking status: Former Research scientist (life sciences)  . Smokeless tobacco: Never Used  Vaping Use  . Vaping Use: Never used  Substance Use Topics  . Alcohol use: No  . Drug use: No    Family History  Problem Relation Age of Onset  . Breast cancer Daughter   . Breast cancer Maternal Aunt   . Diabetes Brother   . Diabetes Sister   . Stomach cancer Mother   . Cancer Other   . GER disease Other   . Obesity Other     Allergies  Allergen Reactions  . Codeine Nausea And Vomiting  . Metformin And Related Diarrhea  . Shrimp [Shellfish Allergy] Swelling    Medication list has been reviewed and updated.  Current Outpatient Medications on File Prior to Visit  Medication Sig Dispense Refill  . acetaminophen (TYLENOL) 500 MG tablet Take 500 mg by mouth every 6 (six) hours as needed for mild pain.    Marland Kitchen albuterol (PROAIR HFA) 108 (90 Base) MCG/ACT inhaler Inhale 2 puffs into the lungs every 4 (four) hours as needed for wheezing or shortness of breath. 1 Inhaler 6  . ALPRAZolam Duanne Moron)  0.5 MG tablet TAKE 1 TABLET(0.5 MG) BY MOUTH THREE TIMES DAILY (Patient taking differently: Take 0.5 mg by mouth 3 (three) times daily.) 90 tablet 2  . blood glucose meter kit and supplies KIT Dispense based on patient and insurance preference. Use up to four times daily as directed. 1 each 0  . colestipol (COLESTID) 1 g tablet Take 1 g by mouth daily.    Marland Kitchen dicyclomine (BENTYL) 20 MG tablet Take 1 tablet (20 mg total) by mouth 3 (three) times daily as needed. (Patient taking differently: Take 20 mg by mouth 3 (three) times daily as needed for spasms.) 360 tablet 0  . diphenhydrAMINE (BENADRYL) 25 MG tablet Take 25 mg by mouth every 6 (six) hours as needed (for allergies.).    Marland Kitchen empagliflozin (JARDIANCE) 10 MG TABS tablet Take 10 mg by mouth daily. 90 tablet 3  . glipiZIDE (GLUCOTROL XL) 5 MG 24 hr tablet Take 1 tablet  (5 mg total) by mouth daily with breakfast. 90 tablet 3  . hydrochlorothiazide (HYDRODIURIL) 25 MG tablet TAKE 1 TABLET(25 MG) BY MOUTH DAILY (Patient taking differently: Take 25 mg by mouth daily.) 90 tablet 2  . hydrocortisone 2.5 % cream Apply 1 application topically daily as needed (itchy dry skin).    Marland Kitchen loperamide (IMODIUM) 2 MG capsule Take 4-6 mg by mouth daily as needed (for IBS symptoms).    . Melatonin 10 MG TABS Take 10 mg by mouth at bedtime.    Marland Kitchen omeprazole (PRILOSEC) 40 MG capsule TAKE 1 CAPSULE(40 MG) BY MOUTH DAILY (Patient taking differently: Take 40 mg by mouth daily.) 90 capsule 2  . ONETOUCH DELICA LANCETS FINE MISC 1 each by Other route daily. 100 each 12  . ONETOUCH ULTRA test strip TEST BLOOD SUGAR(S) ONCE DAILY IN THE MORNING 100 strip 12  . potassium chloride SA (KLOR-CON M20) 20 MEQ tablet Take 1 tablet (20 mEq total) by mouth daily. 90 tablet 3  . sertraline (ZOLOFT) 100 MG tablet Take 1 tablet (100 mg total) by mouth daily. 90 tablet 3  . simvastatin (ZOCOR) 80 MG tablet TAKE 1 TABLET(80 MG) BY MOUTH DAILY (Patient taking differently: Take 80 mg by mouth daily at 6 PM.) 90 tablet 1  . traMADol (ULTRAM) 50 MG tablet Take 1 tablet (50 mg total) by mouth every 6 (six) hours as needed. (Patient taking differently: Take 50 mg by mouth every 6 (six) hours as needed for moderate pain.) 60 tablet 0   No current facility-administered medications on file prior to visit.    Review of Systems:  As per HPI- otherwise negative.   Physical Examination: Vitals:   12/24/20 1116  BP: 120/82  Pulse: 90  Resp: 18  Temp: 97.8 F (36.6 C)  SpO2: 97%   Vitals:   12/24/20 1116  Weight: 186 lb (84.4 kg)  Height: 5' 6"  (1.676 m)   Body mass index is 30.02 kg/m. Ideal Body Weight: Weight in (lb) to have BMI = 25: 154.6  GEN: no acute distress.  Obese but has lost a few lbs HEENT: Atraumatic, Normocephalic.  Ears and Nose: No external deformity. CV: RRR, No M/G/R. No JVD.  No thrill. No extra heart sounds. PULM: CTA B, no wheezes, crackles, rhonchi. No retractions. No resp. distress. No accessory muscle use. ABD: S, NT, ND, +BS. No rebound. No HSM. EXTR: No c/c/e PSYCH: Normally interactive. Conversant.  Wearing oxygen at 2L  Wt Readings from Last 3 Encounters:  12/24/20 186 lb (84.4 kg)  12/15/20 190  lb 1.6 oz (86.2 kg)  11/25/20 192 lb (87.1 kg)    Assessment and Plan: Sleep disorder - Plan: Ambulatory referral to Neurology  Oxygen desaturation  Hospital discharge follow-up  Abnormal chest CT - Plan: [facility] follow-up today She is overall improving- still using oxygen  We discussed oxygen weaning process-suggested that she continue trying to remove her oxygen when she is sitting at rest and monitor her sats.  She can also try tapering her dose slightly, for example she may decrease to 1 L.  She will keep me closely posted as to her progress  Ordered neurology evaluation and sleep study to be done in a couple of months  Repeat chest CT ordered for 3 months from now  Assuming all is well, asked to see me in 3 months  She is planned to see oncology and also pulmonology in the near future She request that we not draw blood today as she is seeing her oncologist next week and they will almost certainly get labs This visit occurred during the SARS-CoV-2 public health emergency.  Safety protocols were in place, including screening questions prior to the visit, additional usage of staff PPE, and extensive cleaning of exam room while observing appropriate contact time as indicated for disinfecting solutions.    Signed Lamar Blinks, MD

## 2020-12-20 NOTE — Patient Instructions (Addendum)
It was good to see you again today, I am so glad you are back home!  We will set you up for a repeat CT scan of your chest in 3 months We will also arrange for a neurology eval/ sleep study in a few months when you are well Let me know if you start to get worse again at all!   Assuming all is well please see me in 3 months We can start weaning oxygen as tolerated to keep sats over 92%.  Let me know if you have questions about this!

## 2020-12-22 ENCOUNTER — Encounter: Payer: Self-pay | Admitting: Family Medicine

## 2020-12-22 NOTE — Telephone Encounter (Signed)
Patient's daughter calling in reference to mothers oxygen level. Patient states oxygen is 95 with oxygen on 2 liters.

## 2020-12-24 ENCOUNTER — Other Ambulatory Visit: Payer: Self-pay

## 2020-12-24 ENCOUNTER — Ambulatory Visit (INDEPENDENT_AMBULATORY_CARE_PROVIDER_SITE_OTHER): Payer: PPO | Admitting: Family Medicine

## 2020-12-24 ENCOUNTER — Encounter: Payer: Self-pay | Admitting: Family Medicine

## 2020-12-24 VITALS — BP 120/82 | HR 90 | Temp 97.8°F | Resp 18 | Ht 66.0 in | Wt 186.0 lb

## 2020-12-24 DIAGNOSIS — G479 Sleep disorder, unspecified: Secondary | ICD-10-CM

## 2020-12-24 DIAGNOSIS — R0902 Hypoxemia: Secondary | ICD-10-CM

## 2020-12-24 DIAGNOSIS — Z09 Encounter for follow-up examination after completed treatment for conditions other than malignant neoplasm: Secondary | ICD-10-CM | POA: Diagnosis not present

## 2020-12-24 DIAGNOSIS — R9389 Abnormal findings on diagnostic imaging of other specified body structures: Secondary | ICD-10-CM

## 2020-12-29 DIAGNOSIS — M47814 Spondylosis without myelopathy or radiculopathy, thoracic region: Secondary | ICD-10-CM | POA: Diagnosis not present

## 2020-12-29 DIAGNOSIS — E119 Type 2 diabetes mellitus without complications: Secondary | ICD-10-CM | POA: Diagnosis not present

## 2020-12-29 DIAGNOSIS — A419 Sepsis, unspecified organism: Secondary | ICD-10-CM | POA: Diagnosis not present

## 2020-12-29 DIAGNOSIS — K7460 Unspecified cirrhosis of liver: Secondary | ICD-10-CM | POA: Diagnosis not present

## 2020-12-29 DIAGNOSIS — R918 Other nonspecific abnormal finding of lung field: Secondary | ICD-10-CM | POA: Diagnosis not present

## 2020-12-29 DIAGNOSIS — E785 Hyperlipidemia, unspecified: Secondary | ICD-10-CM | POA: Diagnosis not present

## 2020-12-29 DIAGNOSIS — I700 Atherosclerosis of aorta: Secondary | ICD-10-CM | POA: Diagnosis not present

## 2020-12-29 DIAGNOSIS — I119 Hypertensive heart disease without heart failure: Secondary | ICD-10-CM | POA: Diagnosis not present

## 2020-12-29 DIAGNOSIS — F419 Anxiety disorder, unspecified: Secondary | ICD-10-CM | POA: Diagnosis not present

## 2020-12-29 DIAGNOSIS — J9601 Acute respiratory failure with hypoxia: Secondary | ICD-10-CM | POA: Diagnosis not present

## 2020-12-29 DIAGNOSIS — J1289 Other viral pneumonia: Secondary | ICD-10-CM | POA: Diagnosis not present

## 2020-12-29 DIAGNOSIS — J181 Lobar pneumonia, unspecified organism: Secondary | ICD-10-CM | POA: Diagnosis not present

## 2020-12-29 DIAGNOSIS — J441 Chronic obstructive pulmonary disease with (acute) exacerbation: Secondary | ICD-10-CM | POA: Diagnosis not present

## 2020-12-29 DIAGNOSIS — I959 Hypotension, unspecified: Secondary | ICD-10-CM | POA: Diagnosis not present

## 2020-12-29 DIAGNOSIS — E876 Hypokalemia: Secondary | ICD-10-CM | POA: Diagnosis not present

## 2020-12-29 DIAGNOSIS — I2510 Atherosclerotic heart disease of native coronary artery without angina pectoris: Secondary | ICD-10-CM | POA: Diagnosis not present

## 2020-12-29 DIAGNOSIS — M1711 Unilateral primary osteoarthritis, right knee: Secondary | ICD-10-CM | POA: Diagnosis not present

## 2020-12-29 DIAGNOSIS — F32A Depression, unspecified: Secondary | ICD-10-CM | POA: Diagnosis not present

## 2020-12-29 DIAGNOSIS — K219 Gastro-esophageal reflux disease without esophagitis: Secondary | ICD-10-CM | POA: Diagnosis not present

## 2020-12-29 DIAGNOSIS — D509 Iron deficiency anemia, unspecified: Secondary | ICD-10-CM | POA: Diagnosis not present

## 2020-12-29 DIAGNOSIS — D696 Thrombocytopenia, unspecified: Secondary | ICD-10-CM | POA: Diagnosis not present

## 2020-12-29 DIAGNOSIS — K7581 Nonalcoholic steatohepatitis (NASH): Secondary | ICD-10-CM | POA: Diagnosis not present

## 2020-12-29 DIAGNOSIS — J90 Pleural effusion, not elsewhere classified: Secondary | ICD-10-CM | POA: Diagnosis not present

## 2020-12-29 DIAGNOSIS — R161 Splenomegaly, not elsewhere classified: Secondary | ICD-10-CM | POA: Diagnosis not present

## 2020-12-29 DIAGNOSIS — J440 Chronic obstructive pulmonary disease with acute lower respiratory infection: Secondary | ICD-10-CM | POA: Diagnosis not present

## 2021-01-01 ENCOUNTER — Inpatient Hospital Stay: Payer: PPO | Attending: Family

## 2021-01-01 ENCOUNTER — Encounter: Payer: Self-pay | Admitting: Hematology & Oncology

## 2021-01-01 ENCOUNTER — Other Ambulatory Visit: Payer: Self-pay

## 2021-01-01 ENCOUNTER — Inpatient Hospital Stay (HOSPITAL_BASED_OUTPATIENT_CLINIC_OR_DEPARTMENT_OTHER): Payer: PPO | Admitting: Hematology & Oncology

## 2021-01-01 ENCOUNTER — Ambulatory Visit (HOSPITAL_BASED_OUTPATIENT_CLINIC_OR_DEPARTMENT_OTHER)
Admission: RE | Admit: 2021-01-01 | Discharge: 2021-01-01 | Disposition: A | Discharge status: Home / Self Care | Payer: PPO | Source: Ambulatory Visit | Attending: Hematology & Oncology | Admitting: Hematology & Oncology

## 2021-01-01 ENCOUNTER — Telehealth: Payer: Self-pay

## 2021-01-01 VITALS — BP 134/53 | HR 80 | Temp 98.8°F | Resp 20 | Wt 187.4 lb

## 2021-01-01 DIAGNOSIS — R531 Weakness: Secondary | ICD-10-CM | POA: Diagnosis not present

## 2021-01-01 DIAGNOSIS — F32A Depression, unspecified: Secondary | ICD-10-CM

## 2021-01-01 DIAGNOSIS — E876 Hypokalemia: Secondary | ICD-10-CM

## 2021-01-01 DIAGNOSIS — J9811 Atelectasis: Secondary | ICD-10-CM

## 2021-01-01 DIAGNOSIS — R911 Solitary pulmonary nodule: Secondary | ICD-10-CM | POA: Diagnosis not present

## 2021-01-01 DIAGNOSIS — R197 Diarrhea, unspecified: Secondary | ICD-10-CM | POA: Insufficient documentation

## 2021-01-01 DIAGNOSIS — K635 Polyp of colon: Secondary | ICD-10-CM

## 2021-01-01 DIAGNOSIS — Z87891 Personal history of nicotine dependence: Secondary | ICD-10-CM

## 2021-01-01 DIAGNOSIS — K7460 Unspecified cirrhosis of liver: Secondary | ICD-10-CM

## 2021-01-01 DIAGNOSIS — Z79899 Other long term (current) drug therapy: Secondary | ICD-10-CM | POA: Diagnosis not present

## 2021-01-01 DIAGNOSIS — Z9181 History of falling: Secondary | ICD-10-CM

## 2021-01-01 DIAGNOSIS — R000 Tachycardia, unspecified: Secondary | ICD-10-CM

## 2021-01-01 DIAGNOSIS — K219 Gastro-esophageal reflux disease without esophagitis: Secondary | ICD-10-CM

## 2021-01-01 DIAGNOSIS — Z96652 Presence of left artificial knee joint: Secondary | ICD-10-CM

## 2021-01-01 DIAGNOSIS — R5383 Other fatigue: Secondary | ICD-10-CM | POA: Insufficient documentation

## 2021-01-01 DIAGNOSIS — Z6831 Body mass index (BMI) 31.0-31.9, adult: Secondary | ICD-10-CM

## 2021-01-01 DIAGNOSIS — R161 Splenomegaly, not elsewhere classified: Secondary | ICD-10-CM | POA: Diagnosis not present

## 2021-01-01 DIAGNOSIS — F419 Anxiety disorder, unspecified: Secondary | ICD-10-CM

## 2021-01-01 DIAGNOSIS — R918 Other nonspecific abnormal finding of lung field: Secondary | ICD-10-CM

## 2021-01-01 DIAGNOSIS — M47814 Spondylosis without myelopathy or radiculopathy, thoracic region: Secondary | ICD-10-CM | POA: Diagnosis not present

## 2021-01-01 DIAGNOSIS — I959 Hypotension, unspecified: Secondary | ICD-10-CM

## 2021-01-01 DIAGNOSIS — K7581 Nonalcoholic steatohepatitis (NASH): Secondary | ICD-10-CM | POA: Insufficient documentation

## 2021-01-01 DIAGNOSIS — J90 Pleural effusion, not elsewhere classified: Secondary | ICD-10-CM

## 2021-01-01 DIAGNOSIS — R0602 Shortness of breath: Secondary | ICD-10-CM | POA: Diagnosis not present

## 2021-01-01 DIAGNOSIS — E785 Hyperlipidemia, unspecified: Secondary | ICD-10-CM

## 2021-01-01 DIAGNOSIS — I119 Hypertensive heart disease without heart failure: Secondary | ICD-10-CM | POA: Diagnosis not present

## 2021-01-01 DIAGNOSIS — D509 Iron deficiency anemia, unspecified: Secondary | ICD-10-CM | POA: Diagnosis not present

## 2021-01-01 DIAGNOSIS — J441 Chronic obstructive pulmonary disease with (acute) exacerbation: Secondary | ICD-10-CM | POA: Diagnosis not present

## 2021-01-01 DIAGNOSIS — E119 Type 2 diabetes mellitus without complications: Secondary | ICD-10-CM | POA: Insufficient documentation

## 2021-01-01 DIAGNOSIS — Z7984 Long term (current) use of oral hypoglycemic drugs: Secondary | ICD-10-CM

## 2021-01-01 DIAGNOSIS — J440 Chronic obstructive pulmonary disease with acute lower respiratory infection: Secondary | ICD-10-CM | POA: Diagnosis not present

## 2021-01-01 DIAGNOSIS — J1289 Other viral pneumonia: Secondary | ICD-10-CM | POA: Diagnosis not present

## 2021-01-01 DIAGNOSIS — Z9981 Dependence on supplemental oxygen: Secondary | ICD-10-CM

## 2021-01-01 DIAGNOSIS — I2510 Atherosclerotic heart disease of native coronary artery without angina pectoris: Secondary | ICD-10-CM | POA: Diagnosis not present

## 2021-01-01 DIAGNOSIS — J9601 Acute respiratory failure with hypoxia: Secondary | ICD-10-CM | POA: Diagnosis not present

## 2021-01-01 DIAGNOSIS — D696 Thrombocytopenia, unspecified: Secondary | ICD-10-CM | POA: Insufficient documentation

## 2021-01-01 DIAGNOSIS — M1711 Unilateral primary osteoarthritis, right knee: Secondary | ICD-10-CM | POA: Diagnosis not present

## 2021-01-01 DIAGNOSIS — Z7951 Long term (current) use of inhaled steroids: Secondary | ICD-10-CM

## 2021-01-01 DIAGNOSIS — I700 Atherosclerosis of aorta: Secondary | ICD-10-CM

## 2021-01-01 DIAGNOSIS — A419 Sepsis, unspecified organism: Secondary | ICD-10-CM | POA: Diagnosis not present

## 2021-01-01 DIAGNOSIS — J181 Lobar pneumonia, unspecified organism: Secondary | ICD-10-CM | POA: Diagnosis not present

## 2021-01-01 LAB — CMP (CANCER CENTER ONLY)
ALT: 14 U/L (ref 0–44)
AST: 18 U/L (ref 15–41)
Albumin: 4.1 g/dL (ref 3.5–5.0)
Alkaline Phosphatase: 55 U/L (ref 38–126)
Anion gap: 6 (ref 5–15)
BUN: 10 mg/dL (ref 8–23)
CO2: 38 mmol/L — ABNORMAL HIGH (ref 22–32)
Calcium: 9.8 mg/dL (ref 8.9–10.3)
Chloride: 96 mmol/L — ABNORMAL LOW (ref 98–111)
Creatinine: 0.54 mg/dL (ref 0.44–1.00)
GFR, Estimated: >60 mL/min (ref >60)
Glucose, Bld: 121 mg/dL — ABNORMAL HIGH (ref 70–99)
Potassium: 3.7 mmol/L (ref 3.5–5.1)
Sodium: 140 mmol/L (ref 135–145)
Total Bilirubin: 1 mg/dL (ref 0.3–1.2)
Total Protein: 6.9 g/dL (ref 6.5–8.1)

## 2021-01-01 LAB — CBC WITH DIFFERENTIAL (CANCER CENTER ONLY)
Abs Immature Granulocytes: 0.03 10*3/uL (ref 0.00–0.07)
Basophils Absolute: 0 10*3/uL (ref 0.0–0.1)
Basophils Relative: 0 %
Eosinophils Absolute: 0 10*3/uL (ref 0.0–0.5)
Eosinophils Relative: 1 %
HCT: 33.1 % — ABNORMAL LOW (ref 36.0–46.0)
Hemoglobin: 10.7 g/dL — ABNORMAL LOW (ref 12.0–15.0)
Immature Granulocytes: 1 %
Lymphocytes Relative: 26 %
Lymphs Abs: 1.2 10*3/uL (ref 0.7–4.0)
MCH: 27.2 pg (ref 26.0–34.0)
MCHC: 32.3 g/dL (ref 30.0–36.0)
MCV: 84 fL (ref 80.0–100.0)
Monocytes Absolute: 0.5 10*3/uL (ref 0.1–1.0)
Monocytes Relative: 10 %
Neutro Abs: 2.9 10*3/uL (ref 1.7–7.7)
Neutrophils Relative %: 62 %
Platelet Count: 58 10*3/uL — ABNORMAL LOW (ref 150–400)
RBC: 3.94 MIL/uL (ref 3.87–5.11)
RDW: 17.9 % — ABNORMAL HIGH (ref 11.5–15.5)
WBC Count: 4.7 10*3/uL (ref 4.0–10.5)
nRBC: 0 % (ref 0.0–0.2)

## 2021-01-01 LAB — PLATELET BY CITRATE

## 2021-01-01 LAB — SAVE SMEAR(SSMR), FOR PROVIDER SLIDE REVIEW

## 2021-01-01 NOTE — Telephone Encounter (Signed)
S.w pt and she is aware of her appt per 01/01/21 los  Rachael Buck

## 2021-01-01 NOTE — Progress Notes (Signed)
Hematology and Oncology Follow Up Visit  Rachael Buck [id number] Jul 12, 1941 80 y.o. 01/01/2021   Principle Diagnosis:  Thrombocytopenia -- NASH/Splenomegaly ; Immune based thrombocytopenia Iron deficiency  Current Therapy: Nplateas indicated  IV iron as indicated   Interim History:  Rachael Buck is here today for follow-up.  Unfortunately, since we last saw her, she was hospitalized.  The week of Easter, she apparently developed shortness of breath and had pneumonia.  She had a severe pneumonia on the right side.  In the did a CT angiogram on her, it showed a 1.9 x 1.3 cm nodule in the right lower lobe.  Again this is unclear as to etiology.  I would not think this would be anything malignant.  However, it will have to be watched.  Think that Rachael Buck will see pulmonary medicine next week or so.  She had no problems with respect to bleeding.  She has thrombocytopenia secondary to NASH and some splenomegaly.  We did do a ultrasound of her spleen today.  The report is not back yet.  She has had no problems with leg swelling.  She does have diabetes.  She does have some loose stool.  And this is been chronic.  I am just amazed that she had this event.  Thankfully she was not intubated.  Currently, I would have to say performance status is by ECOG 2.    Medications:  Allergies as of 01/01/2021      Reactions   Codeine Nausea And Vomiting   Metformin And Related Diarrhea   Shrimp [shellfish Allergy] Swelling      Medication List       Accurate as of Jan 01, 2021 11:21 AM. If you have any questions, ask your nurse or doctor.        acetaminophen 500 MG tablet Commonly known as: TYLENOL Take 500 mg by mouth every 6 (six) hours as needed for mild pain.   albuterol 108 (90 Base) MCG/ACT inhaler Commonly known as: ProAir HFA Inhale 2 puffs into the lungs every 4 (four) hours as needed for wheezing or shortness of breath.   ALPRAZolam 0.5 MG  tablet Commonly known as: XANAX TAKE 1 TABLET(0.5 MG) BY MOUTH THREE TIMES DAILY What changed: See the new instructions.   blood glucose meter kit and supplies Kit Dispense based on patient and insurance preference. Use up to four times daily as directed.   colestipol 1 g tablet Commonly known as: COLESTID Take 1 g by mouth daily.   dicyclomine 20 MG tablet Commonly known as: BENTYL Take 1 tablet (20 mg total) by mouth 3 (three) times daily as needed. What changed: reasons to take this   diphenhydrAMINE 25 MG tablet Commonly known as: BENADRYL Take 25 mg by mouth every 6 (six) hours as needed (for allergies.).   empagliflozin 10 MG Tabs tablet Commonly known as: JARDIANCE Take 10 mg by mouth daily.   glipiZIDE 5 MG 24 hr tablet Commonly known as: GLUCOTROL XL Take 1 tablet (5 mg total) by mouth daily with breakfast.   hydrochlorothiazide 25 MG tablet Commonly known as: HYDRODIURIL TAKE 1 TABLET(25 MG) BY MOUTH DAILY What changed: See the new instructions.   hydrocortisone 2.5 % cream Apply 1 application topically daily as needed (itchy dry skin).   loperamide 2 MG capsule Commonly known as: IMODIUM Take 4-6 mg by mouth daily as needed (for IBS symptoms).   Melatonin 10 MG Tabs Take 10 mg by mouth at bedtime.   omeprazole 40 MG  capsule Commonly known as: PRILOSEC TAKE 1 CAPSULE(40 MG) BY MOUTH DAILY What changed: See the new instructions.   OneTouch Delica Lancets Fine Misc 1 each by Other route daily.   OneTouch Ultra test strip Generic drug: glucose blood TEST BLOOD SUGAR(S) ONCE DAILY IN THE MORNING   potassium chloride SA 20 MEQ tablet Commonly known as: [place]-Con M20 Take 1 tablet (20 mEq total) by mouth daily.   sertraline 100 MG tablet Commonly known as: ZOLOFT Take 1 tablet (100 mg total) by mouth daily.   simvastatin 80 MG tablet Commonly known as: ZOCOR TAKE 1 TABLET(80 MG) BY MOUTH DAILY What changed: See the new instructions.   traMADol  50 MG tablet Commonly known as: ULTRAM Take 1 tablet (50 mg total) by mouth every 6 (six) hours as needed. What changed: reasons to take this       Allergies:  Allergies  Allergen Reactions  . Codeine Nausea And Vomiting  . Metformin And Related Diarrhea  . Shrimp [Shellfish Allergy] Swelling    Past Medical History, Surgical history, Social history, and Family History were reviewed and updated.  Review of Systems: Review of Systems  Constitutional: Positive for malaise/fatigue.  HENT: Negative.   Eyes: Negative.   Respiratory: Negative.   Cardiovascular: Negative.   Gastrointestinal: Positive for abdominal pain.  Genitourinary: Negative.   Musculoskeletal: Negative.   Skin: Negative.   Neurological: Negative.   Endo/Heme/Allergies: Negative.   Psychiatric/Behavioral: Negative.      Physical Exam:  vitals were not taken for this visit.   Wt Readings from Last 3 Encounters:  12/24/20 186 lb (84.4 kg)  12/15/20 190 lb 1.6 oz (86.2 kg)  11/25/20 192 lb (87.1 kg)    Physical Exam Vitals reviewed.  HENT:     Head: Normocephalic and atraumatic.  Eyes:     Pupils: Pupils are equal, round, and reactive to light.  Cardiovascular:     Rate and Rhythm: Normal rate and regular rhythm.     Heart sounds: Normal heart sounds.  Pulmonary:     Effort: Pulmonary effort is normal.     Breath sounds: Normal breath sounds.  Abdominal:     General: Bowel sounds are normal.     Palpations: Abdomen is soft.  Musculoskeletal:        General: No tenderness or deformity. Normal range of motion.     Cervical back: Normal range of motion.  Lymphadenopathy:     Cervical: No cervical adenopathy.  Skin:    General: Skin is warm and dry.     Findings: No erythema or rash.  Neurological:     Mental Status: She is alert and oriented to person, place, and time.  Psychiatric:        Behavior: Behavior normal.        Thought Content: Thought content normal.        Judgment: Judgment  normal.      Lab Results  Component Value Date   WBC 4.7 01/01/2021   HGB 10.7 (L) 01/01/2021   HCT 33.1 (L) 01/01/2021   MCV 84.0 01/01/2021   PLT 58 (L) 01/01/2021   Lab Results  Component Value Date   FERRITIN 12 11/20/2020   IRON 69 11/20/2020   TIBC 496 (H) 11/20/2020   UIBC 427 (H) 11/20/2020   IRONPCTSAT 14 (L) 11/20/2020   Lab Results  Component Value Date   RETICCTPCT 2.2 07/02/2020   RBC 3.94 01/01/2021   No results found for: KPAFRELGTCHN, LAMBDASER, KAPLAMBRATIO No results  found for: IGGSERUM, IGA, IGMSERUM No results found for: Odetta Pink, SPEI   Chemistry      Component Value Date/Time   NA 140 01/01/2021 0941   NA 127 (L) 02/14/2017 1335   K 3.7 01/01/2021 0941   K 3.6 02/14/2017 1335   CL 96 (L) 01/01/2021 0941   CL 92 (L) 02/14/2017 1335   CO2 38 (H) 01/01/2021 0941   CO2 28 02/14/2017 1335   BUN 10 01/01/2021 0941   BUN 7 (L) 02/14/2017 1335   CREATININE 0.54 01/01/2021 0941   CREATININE 0.47 (L) 02/14/2017 1335   CREATININE 0.56 07/24/2014 1204      Component Value Date/Time   CALCIUM 9.8 01/01/2021 0941   CALCIUM 9.3 02/14/2017 1335   ALKPHOS 55 01/01/2021 0941   ALKPHOS 68 02/14/2017 1335   AST 18 01/01/2021 0941   ALT 14 01/01/2021 0941   BILITOT 1.0 01/01/2021 0941      Impression and Plan: Rachael Buck is a very pleasant 80 yo caucasian female with 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    Her platelet count is holding pretty stable.  Thankfully, she had no problems while she was hospitalized.  Not sure what to make of this lung nodule in the right lower lobe.  We will have to do another CT scan on her.  Again, she is going to see Pulmonary Medicine and I am sure they will follow-up with a CT scan.  We will follow her back up in another 6 weeks.  We will have to see what the splenic ultrasound shows.    [name], MD 5/5/202211:21 AM

## 2021-01-02 ENCOUNTER — Telehealth: Payer: Self-pay

## 2021-01-02 NOTE — Telephone Encounter (Signed)
Plan of care from [facility] received, signed , and faxed back. Fax confirmation received and placed in basket for scanning. -Jma

## 2021-01-06 ENCOUNTER — Telehealth: Payer: Self-pay | Admitting: *Deleted

## 2021-01-06 NOTE — Telephone Encounter (Signed)
-----   Message from [name], MD sent at 01/05/2021  5:10 PM EDT ----- Call - the spleen has not changed in size!!  This is wonderful!!  Film/video editor

## 2021-01-06 NOTE — Telephone Encounter (Signed)
Pt notified, no concerns at this time.

## 2021-01-07 ENCOUNTER — Telehealth: Payer: Self-pay | Admitting: Family Medicine

## 2021-01-07 DIAGNOSIS — E119 Type 2 diabetes mellitus without complications: Secondary | ICD-10-CM

## 2021-01-07 MED ORDER — EMPAGLIFLOZIN 10 MG PO TABS: 10.0000 mg | ORAL_TABLET | Freq: Every day | ORAL | 3 refills | Status: DC

## 2021-01-07 MED ORDER — GLIPIZIDE ER 5 MG PO TB24: 5.0000 mg | ORAL_TABLET | Freq: Every day | ORAL | 3 refills | Status: DC

## 2021-01-07 NOTE — Telephone Encounter (Signed)
Pt is needing rx Jardiance and Glipizide sent to [facility] on [address]. States she called it in but had not heard anything. Pt states she no longer uses [facility] delivery.

## 2021-01-07 NOTE — Telephone Encounter (Signed)
Medication sent to correct pharmacy. [employer] has been taken out of chart.

## 2021-01-13 ENCOUNTER — Encounter: Payer: Self-pay | Admitting: Emergency Medicine

## 2021-01-13 ENCOUNTER — Other Ambulatory Visit: Payer: Self-pay

## 2021-01-13 ENCOUNTER — Ambulatory Visit: Payer: PPO | Admitting: Emergency Medicine

## 2021-01-13 DIAGNOSIS — J449 Chronic obstructive pulmonary disease, unspecified: Secondary | ICD-10-CM

## 2021-01-13 DIAGNOSIS — R911 Solitary pulmonary nodule: Secondary | ICD-10-CM | POA: Diagnosis not present

## 2021-01-13 DIAGNOSIS — J4489 Other specified chronic obstructive pulmonary disease: Secondary | ICD-10-CM

## 2021-01-13 MED ORDER — STIOLTO RESPIMAT 2.5-2.5 MCG/ACT IN AERS: 2.0000 | INHALATION_SPRAY | Freq: Every day | RESPIRATORY_TRACT | 0 refills | Status: DC

## 2021-01-13 NOTE — Patient Instructions (Addendum)
We will try starting Stiolto 2 puffs once a day.  Keep your albuterol available to use 2 puffs if needed for shortness of breath, chest tightness, wheezing. You can use your DuoNeb every 6 hours if you need it for shortness of breath We will review your CT chest in July to compare with your priors. Agree with your sleep study as planned Walking oximetry today on room air.  If your oxygen level does not continue to drop then we can discontinue it through your company. Follow with Dr Lamonte Sakai in July after your CT

## 2021-01-13 NOTE — Addendum Note (Signed)
Addended by: Gavin Potters R on: 01/13/2021 02:41 PM   Modules accepted: Orders

## 2021-01-13 NOTE — Assessment & Plan Note (Signed)
Question whether this may have been reactive lymphadenopathy in the setting of severe right-sided pneumonia, especially given his perihilar location.  She does have a tobacco history, is at risk for primary malignancy.  She needs a repeat CT chest in July.  It looks like this has already been ordered by Dr. Edilia Bo.  I will see her after the CT to review, determine the risk associated with the nodule depending on its appearance.  If we believe it merits tissue diagnosis then we can discuss bronchoscopy.

## 2021-01-13 NOTE — Assessment & Plan Note (Addendum)
Currently managed on albuterol or DuoNeb which she uses rarely.  She does not exacerbate frequently so I think we can try using LABA/LAMA without the ICS component.  We will do a trial of Stiolto to see if she gets benefit.

## 2021-01-13 NOTE — Progress Notes (Signed)
Subjective:    Patient ID: Rachael Buck, female    DOB: [birth year], 80 y.o.   MRN: [id number]  HPI 80 year old former smoker (15-17 pack years) with a history of obesity, NASH cirrhosis, diabetes, hypertension.  She was formally seen in our office by Dr. Gwenette Greet for asthma and UA instability in the setting of GERD and ACE inhibitor, obstruction on PFT 2011 FEV1 60 - 67% predicted. Currently managed on DuoNeb as needed. Uses rarely.   She was admitted for hypoxemic and hypercapnic respiratory failure in the setting of right middle and right lower lobe infiltrates, severe sepsis.  Her CT chest from admission 12/09/2020 showed significant right middle and upper lobe infiltrates, mild right basilar atelectasis, a 1.9 x 1.3 cm noncalcified perihilar right lower lobe nodule, small right pleural effusion. She required BiPAP transiently.  She has improved significantly. Was sent home on O2, has not been using it last several days. She has seen SpO2 92-96%.   Pulmonary function testing 08/27/2010 reviewed by me, shows moderately severe obstruction with a borderline bronchodilator response, normal lung volumes and a decreased diffusion capacity that corrects to the normal range when adjusted for alveolar volume.   Review of Systems As per HPI  Past Medical History:  Diagnosis Date  . Anxiety   . Arthritis   . Asthma   . Cirrhosis, nonalcoholic ([place])   . Colon polyps   . Diabetes mellitus   . Diverticulitis   . GERD (gastroesophageal reflux disease)   . Hyperlipidemia   . Hypertension   . NASH (nonalcoholic steatohepatitis)   . Obesity   . Pneumonia      Family History  Problem Relation Age of Onset  . Breast cancer Daughter   . Breast cancer Maternal Aunt   . Diabetes Brother   . Diabetes Sister   . Stomach cancer Mother   . Cancer Other   . GER disease Other   . Obesity Other      Social History   Socioeconomic History  . Marital status: Married    Spouse name: Not on  file  . Number of children: Not on file  . Years of education: Not on file  . Highest education level: Not on file  Occupational History  . Not on file  Tobacco Use  . Smoking status: Former Smoker    Packs/day: 1.50    Years: 15.00    Pack years: 22.50    Types: Cigarettes  . Smokeless tobacco: Never Used  . Tobacco comment: Quit 2002.  Vaping Use  . Vaping Use: Never used  Substance and Sexual Activity  . Alcohol use: No  . Drug use: No  . Sexual activity: Not on file  Other Topics Concern  . Not on file  Social History Narrative  . Not on file   Social Determinants of Health   Financial Resource Strain: Not on file  Food Insecurity: Not on file  Transportation Needs: Not on file  Physical Activity: Not on file  Stress: Not on file  Social Connections: Not on file  Intimate Partner Violence: Not on file     Allergies  Allergen Reactions  . Codeine Nausea And Vomiting  . Metformin And Related Diarrhea  . Shrimp [Shellfish Allergy] Swelling     Outpatient Medications Prior to Visit  Medication Sig Dispense Refill  . acetaminophen (TYLENOL) 500 MG tablet Take 500 mg by mouth every 6 (six) hours as needed for mild pain.    Marland Kitchen albuterol (PROAIR HFA)  108 (90 Base) MCG/ACT inhaler Inhale 2 puffs into the lungs every 4 (four) hours as needed for wheezing or shortness of breath. 1 Inhaler 6  . ALPRAZolam (XANAX) 0.5 MG tablet TAKE 1 TABLET(0.5 MG) BY MOUTH THREE TIMES DAILY (Patient taking differently: Take 0.5 mg by mouth 3 (three) times daily.) 90 tablet 2  . blood glucose meter kit and supplies KIT Dispense based on patient and insurance preference. Use up to four times daily as directed. 1 each 0  . colestipol (COLESTID) 1 g tablet Take 1 g by mouth daily.    Marland Kitchen dicyclomine (BENTYL) 20 MG tablet Take 1 tablet (20 mg total) by mouth 3 (three) times daily as needed. (Patient taking differently: Take 20 mg by mouth 3 (three) times daily as needed for spasms.) 360 tablet 0   . diphenhydrAMINE (BENADRYL) 25 MG tablet Take 25 mg by mouth every 6 (six) hours as needed (for allergies.).    Marland Kitchen empagliflozin (JARDIANCE) 10 MG TABS tablet Take 1 tablet (10 mg total) by mouth daily. 90 tablet 3  . glipiZIDE (GLUCOTROL XL) 5 MG 24 hr tablet Take 1 tablet (5 mg total) by mouth daily with breakfast. 90 tablet 3  . hydrochlorothiazide (HYDRODIURIL) 25 MG tablet TAKE 1 TABLET(25 MG) BY MOUTH DAILY (Patient taking differently: Take 25 mg by mouth daily.) 90 tablet 2  . hydrocortisone 2.5 % cream Apply 1 application topically daily as needed (itchy dry skin).    Marland Kitchen loperamide (IMODIUM) 2 MG capsule Take 4-6 mg by mouth daily as needed (for IBS symptoms).    . Melatonin 10 MG TABS Take 10 mg by mouth at bedtime.    Marland Kitchen omeprazole (PRILOSEC) 40 MG capsule TAKE 1 CAPSULE(40 MG) BY MOUTH DAILY (Patient taking differently: Take 40 mg by mouth daily.) 90 capsule 2  . ONETOUCH DELICA LANCETS FINE MISC 1 each by Other route daily. 100 each 12  . ONETOUCH ULTRA test strip TEST BLOOD SUGAR(S) ONCE DAILY IN THE MORNING 100 strip 12  . potassium chloride SA (KLOR-CON M20) 20 MEQ tablet Take 1 tablet (20 mEq total) by mouth daily. 90 tablet 3  . sertraline (ZOLOFT) 100 MG tablet Take 1 tablet (100 mg total) by mouth daily. 90 tablet 3  . simvastatin (ZOCOR) 80 MG tablet TAKE 1 TABLET(80 MG) BY MOUTH DAILY (Patient taking differently: Take 80 mg by mouth daily at 6 PM.) 90 tablet 1  . traMADol (ULTRAM) 50 MG tablet Take 1 tablet (50 mg total) by mouth every 6 (six) hours as needed. (Patient taking differently: Take 50 mg by mouth every 6 (six) hours as needed for moderate pain.) 60 tablet 0   No facility-administered medications prior to visit.         Objective:   Physical Exam Vitals:   01/13/21 1340  BP: 118/82  Pulse: 93  Temp: 98 F (36.7 C)  TempSrc: Temporal  SpO2: 97%  Weight: 186 lb 3.2 oz (84.5 kg)  Height: 5' 6"  (1.676 m)   Gen: Pleasant, overweight woman, in no distress,   normal affect  ENT: No lesions,  mouth clear,  oropharynx clear, no postnasal drip  Neck: No JVD, no stridor  Lungs: No use of accessory muscles, no crackles or wheezing on normal respiration, no wheeze on forced expiration  Cardiovascular: RRR, heart sounds normal, no murmur or gallops, no peripheral edema  Musculoskeletal: No deformities, no cyanosis or clubbing  Neuro: alert, awake, non focal  Skin: Warm, no lesions or rash  Assessment & Plan:  Pulmonary nodule 1 cm or greater in diameter Question whether this may have been reactive lymphadenopathy in the setting of severe right-sided pneumonia, especially given his perihilar location.  She does have a tobacco history, is at risk for primary malignancy.  She needs a repeat CT chest in July.  It looks like this has already been ordered by Dr. Edilia Bo.  I will see her after the CT to review, determine the risk associated with the nodule depending on its appearance.  If we believe it merits tissue diagnosis then we can discuss bronchoscopy.  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  She does not exacerbate frequently so I think we can try using LABA/LAMA without the ICS component.  We will do a trial of Stiolto to see if she gets benefit.  Baltazar Apo, MD, PhD 01/13/2021, 2:04 PM [facility] [phone] or if no answer before 7:00PM call [phone] For any issues after 7:00PM please call eLink [phone]

## 2021-01-14 ENCOUNTER — Other Ambulatory Visit: Payer: Self-pay | Admitting: Family Medicine

## 2021-01-17 DIAGNOSIS — J189 Pneumonia, unspecified organism: Secondary | ICD-10-CM | POA: Diagnosis not present

## 2021-01-17 DIAGNOSIS — E876 Hypokalemia: Secondary | ICD-10-CM | POA: Diagnosis not present

## 2021-01-19 ENCOUNTER — Telehealth: Payer: Self-pay | Admitting: Emergency Medicine

## 2021-01-19 DIAGNOSIS — J449 Chronic obstructive pulmonary disease, unspecified: Secondary | ICD-10-CM

## 2021-01-19 NOTE — Telephone Encounter (Signed)
I have called the pt and she is aware that order will be placed to have the oxygen picked up.  I have placed the order and nothing further is needed.

## 2021-01-19 NOTE — Telephone Encounter (Signed)
Pt states she has not used O2 in over a week, would like an order to have this d/c'ed and picked up. It looks like this was discussed in 5/17 OV.  DME: [facility] per pt.   RB please advise if ok to d/c O2. Thanks!

## 2021-01-19 NOTE — Telephone Encounter (Signed)
Ok to d/c the O2

## 2021-01-27 ENCOUNTER — Telehealth: Payer: Self-pay | Admitting: Emergency Medicine

## 2021-01-27 DIAGNOSIS — J449 Chronic obstructive pulmonary disease, unspecified: Secondary | ICD-10-CM

## 2021-01-27 NOTE — Telephone Encounter (Signed)
RB please advise. Thanks   Pt is requesting that we send an order in to [employer] for them to pick her oxygen up.    01/13/21 was her last OV with RB We will try starting Stiolto 2 puffs once a day.  Keep your albuterol available to use 2 puffs if needed for shortness of breath, chest tightness, wheezing. You can use your DuoNeb every 6 hours if you need it for shortness of breath We will review your CT chest in July to compare with your priors. Agree with your sleep study as planned Walking oximetry today on room air.  If your oxygen level does not continue to drop then we can discontinue it through your company. Follow with Dr Lamonte Sakai in July after your CT

## 2021-01-28 ENCOUNTER — Other Ambulatory Visit: Payer: Self-pay | Admitting: Family Medicine

## 2021-01-28 NOTE — Addendum Note (Signed)
Addended by: Vanessa Barbara on: 01/28/2021 03:50 PM   Modules accepted: Orders

## 2021-01-28 NOTE — Telephone Encounter (Signed)
Yes - ok to d/c the o2

## 2021-01-28 NOTE — Telephone Encounter (Signed)
Called and spoke with patient, advised that Dr. Lamonte Sakai stated we could send an order to [employer] to discontinue her oxygen.  She verbalized understanding.  Nothing further needed.

## 2021-02-12 ENCOUNTER — Inpatient Hospital Stay: Payer: PPO | Admitting: Hematology & Oncology

## 2021-02-12 ENCOUNTER — Inpatient Hospital Stay: Payer: PPO | Attending: Family

## 2021-02-12 ENCOUNTER — Encounter: Payer: Self-pay | Admitting: Hematology & Oncology

## 2021-02-12 ENCOUNTER — Telehealth: Payer: Self-pay

## 2021-02-12 ENCOUNTER — Other Ambulatory Visit: Payer: Self-pay

## 2021-02-12 VITALS — BP 135/65 | HR 92 | Temp 98.5°F | Resp 17 | Wt 189.0 lb

## 2021-02-12 DIAGNOSIS — K7581 Nonalcoholic steatohepatitis (NASH): Secondary | ICD-10-CM | POA: Insufficient documentation

## 2021-02-12 DIAGNOSIS — R161 Splenomegaly, not elsewhere classified: Secondary | ICD-10-CM | POA: Insufficient documentation

## 2021-02-12 DIAGNOSIS — E08641 Diabetes mellitus due to underlying condition with hypoglycemia with coma: Secondary | ICD-10-CM

## 2021-02-12 DIAGNOSIS — D696 Thrombocytopenia, unspecified: Secondary | ICD-10-CM | POA: Insufficient documentation

## 2021-02-12 DIAGNOSIS — Z79899 Other long term (current) drug therapy: Secondary | ICD-10-CM | POA: Insufficient documentation

## 2021-02-12 DIAGNOSIS — R911 Solitary pulmonary nodule: Secondary | ICD-10-CM | POA: Diagnosis not present

## 2021-02-12 LAB — CBC WITH DIFFERENTIAL (CANCER CENTER ONLY)
Abs Immature Granulocytes: 0.02 10*3/uL (ref 0.00–0.07)
Basophils Absolute: 0 10*3/uL (ref 0.0–0.1)
Basophils Relative: 1 %
Eosinophils Absolute: 0 10*3/uL (ref 0.0–0.5)
Eosinophils Relative: 0 %
HCT: 32.6 % — ABNORMAL LOW (ref 36.0–46.0)
Hemoglobin: 10.5 g/dL — ABNORMAL LOW (ref 12.0–15.0)
Immature Granulocytes: 0 %
Lymphocytes Relative: 25 %
Lymphs Abs: 1.2 10*3/uL (ref 0.7–4.0)
MCH: 26 pg (ref 26.0–34.0)
MCHC: 32.2 g/dL (ref 30.0–36.0)
MCV: 80.7 fL (ref 80.0–100.0)
Monocytes Absolute: 0.5 10*3/uL (ref 0.1–1.0)
Monocytes Relative: 10 %
Neutro Abs: 3.1 10*3/uL (ref 1.7–7.7)
Neutrophils Relative %: 64 %
Platelet Count: 64 10*3/uL — ABNORMAL LOW (ref 150–400)
RBC: 4.04 MIL/uL (ref 3.87–5.11)
RDW: 16.2 % — ABNORMAL HIGH (ref 11.5–15.5)
WBC Count: 4.8 10*3/uL (ref 4.0–10.5)
nRBC: 0 % (ref 0.0–0.2)

## 2021-02-12 LAB — SAVE SMEAR(SSMR), FOR PROVIDER SLIDE REVIEW

## 2021-02-12 LAB — LACTATE DEHYDROGENASE: LDH: 138 U/L (ref 98–192)

## 2021-02-12 LAB — CMP (CANCER CENTER ONLY)
ALT: 12 U/L (ref 0–44)
AST: 20 U/L (ref 15–41)
Albumin: 4.4 g/dL (ref 3.5–5.0)
Alkaline Phosphatase: 55 U/L (ref 38–126)
Anion gap: 9 (ref 5–15)
BUN: 13 mg/dL (ref 8–23)
CO2: 35 mmol/L — ABNORMAL HIGH (ref 22–32)
Calcium: 9.7 mg/dL (ref 8.9–10.3)
Chloride: 95 mmol/L — ABNORMAL LOW (ref 98–111)
Creatinine: 0.71 mg/dL (ref 0.44–1.00)
GFR, Estimated: >60 mL/min (ref >60)
Glucose, Bld: 144 mg/dL — ABNORMAL HIGH (ref 70–99)
Potassium: 3.1 mmol/L — ABNORMAL LOW (ref 3.5–5.1)
Sodium: 139 mmol/L (ref 135–145)
Total Bilirubin: 0.8 mg/dL (ref 0.3–1.2)
Total Protein: 6.8 g/dL (ref 6.5–8.1)

## 2021-02-12 LAB — PLATELET BY CITRATE

## 2021-02-12 NOTE — Telephone Encounter (Signed)
Appts made per 02/12/21 los and pt to gain sch at [employer] and through [employer]

## 2021-02-12 NOTE — Progress Notes (Signed)
Hematology and Oncology Follow Up Visit  Rachael Buck [id number] Aug 14, 1941 80 y.o. 02/12/2021   Principle Diagnosis:  Thrombocytopenia -- NASH/Splenomegaly ; Immune based thrombocytopenia Iron deficiency    Current Therapy:        Nplate as indicated  IV iron as indicated    Interim History:  Rachael Buck is here today for follow-up.  She feels tired.  I am not sure exactly why although I know she is quite anemic.  We will send off an erythropoietin level on her.  It is possible that she may have a abnormally low erythropoietin level that is causing some of this anemia.  I realize that she was in the hospital over the Easter.  I know she had pneumonia.  She has had no problems with bleeding.  Apparently, there is some issue with respect to a nodule that was found on a CT angiogram.  This was in the right lower lobe.  We will have to see about a follow-up scan on her.  She has had no problems with bowels or bladder.  Para she is had no bleeding.  She has had no rashes.  There is been no cough.  Overall, performance status is ECOG 1.     Medications:  Allergies as of 02/12/2021       Reactions   Codeine Nausea And Vomiting   Metformin And Related Diarrhea   Shrimp [shellfish Allergy] Swelling        Medication List        Accurate as of February 12, 2021  3:23 PM. If you have any questions, ask your nurse or doctor.          acetaminophen 500 MG tablet Commonly known as: TYLENOL Take 500 mg by mouth every 6 (six) hours as needed for mild pain.   albuterol 108 (90 Base) MCG/ACT inhaler Commonly known as: ProAir HFA Inhale 2 puffs into the lungs every 4 (four) hours as needed for wheezing or shortness of breath.   ALPRAZolam 0.5 MG tablet Commonly known as: XANAX TAKE 1 TABLET(0.5 MG) BY MOUTH THREE TIMES DAILY What changed: See the new instructions.   blood glucose meter kit and supplies Kit Dispense based on patient and insurance preference. Use up to  four times daily as directed.   colestipol 1 g tablet Commonly known as: COLESTID Take 1 g by mouth daily.   dicyclomine 20 MG tablet Commonly known as: BENTYL Take 1 tablet (20 mg total) by mouth 3 (three) times daily as needed.   diphenhydrAMINE 25 MG tablet Commonly known as: BENADRYL Take 25 mg by mouth every 6 (six) hours as needed (for allergies.).   empagliflozin 10 MG Tabs tablet Commonly known as: JARDIANCE Take 1 tablet (10 mg total) by mouth daily.   glipiZIDE 5 MG 24 hr tablet Commonly known as: GLUCOTROL XL Take 1 tablet (5 mg total) by mouth daily with breakfast.   hydrochlorothiazide 25 MG tablet Commonly known as: HYDRODIURIL TAKE 1 TABLET(25 MG) BY MOUTH DAILY What changed: See the new instructions.   hydrocortisone 2.5 % cream Apply 1 application topically daily as needed (itchy dry skin).   loperamide 2 MG capsule Commonly known as: IMODIUM Take 4-6 mg by mouth daily as needed (for IBS symptoms).   Melatonin 10 MG Tabs Take 10 mg by mouth at bedtime.   omeprazole 40 MG capsule Commonly known as: PRILOSEC Take 1 capsule (40 mg total) by mouth daily.   OneTouch Delica Lancets Fine Misc 1 each  by Other route daily.   OneTouch Ultra test strip Generic drug: glucose blood TEST BLOOD SUGAR(S) ONCE DAILY IN THE MORNING   potassium chloride SA 20 MEQ tablet Commonly known as: [place]-Con M20 Take 1 tablet (20 mEq total) by mouth daily.   sertraline 100 MG tablet Commonly known as: ZOLOFT Take 1 tablet (100 mg total) by mouth daily.   simvastatin 80 MG tablet Commonly known as: ZOCOR TAKE 1 TABLET(80 MG) BY MOUTH DAILY   Stiolto Respimat 2.5-2.5 MCG/ACT Aers Generic drug: Tiotropium Bromide-Olodaterol Inhale 2 puffs into the lungs daily. What changed: additional instructions   traMADol 50 MG tablet Commonly known as: ULTRAM Take 1 tablet (50 mg total) by mouth every 6 (six) hours as needed. What changed: reasons to take this         Allergies:  Allergies  Allergen Reactions   Codeine Nausea And Vomiting   Metformin And Related Diarrhea   Shrimp [Shellfish Allergy] Swelling    Past Medical History, Surgical history, Social history, and Family History were reviewed and updated.  Review of Systems: Review of Systems  Constitutional:  Positive for malaise/fatigue.  HENT: Negative.    Eyes: Negative.   Respiratory: Negative.    Cardiovascular: Negative.   Gastrointestinal:  Positive for abdominal pain.  Genitourinary: Negative.   Musculoskeletal: Negative.   Skin: Negative.   Neurological: Negative.   Endo/Heme/Allergies: Negative.   Psychiatric/Behavioral: Negative.      Physical Exam:  weight is 189 lb (85.7 kg). Her oral temperature is 98.5 F (36.9 C). Her blood pressure is 135/65 and her pulse is 92. Her respiration is 17.   Wt Readings from Last 3 Encounters:  02/12/21 189 lb (85.7 kg)  01/13/21 186 lb 3.2 oz (84.5 kg)  01/01/21 187 lb 6.4 oz (85 kg)    Physical Exam Vitals reviewed.  HENT:     Head: Normocephalic and atraumatic.  Eyes:     Pupils: Pupils are equal, round, and reactive to light.  Cardiovascular:     Rate and Rhythm: Normal rate and regular rhythm.     Heart sounds: Normal heart sounds.  Pulmonary:     Effort: Pulmonary effort is normal.     Breath sounds: Normal breath sounds.  Abdominal:     General: Bowel sounds are normal.     Palpations: Abdomen is soft.  Musculoskeletal:        General: No tenderness or deformity. Normal range of motion.     Cervical back: Normal range of motion.  Lymphadenopathy:     Cervical: No cervical adenopathy.  Skin:    General: Skin is warm and dry.     Findings: No erythema or rash.  Neurological:     Mental Status: She is alert and oriented to person, place, and time.  Psychiatric:        Behavior: Behavior normal.        Thought Content: Thought content normal.        Judgment: Judgment normal.     Lab Results   Component Value Date   WBC 4.8 02/12/2021   HGB 10.5 (L) 02/12/2021   HCT 32.6 (L) 02/12/2021   MCV 80.7 02/12/2021   PLT 64 (L) 02/12/2021   Lab Results  Component Value Date   FERRITIN 12 11/20/2020   IRON 69 11/20/2020   TIBC 496 (H) 11/20/2020   UIBC 427 (H) 11/20/2020   IRONPCTSAT 14 (L) 11/20/2020   Lab Results  Component Value Date   RETICCTPCT 2.2 07/02/2020  RBC 4.04 02/12/2021   No results found for: KPAFRELGTCHN, LAMBDASER, KAPLAMBRATIO No results found for: IGGSERUM, IGA, IGMSERUM No results found for: Odetta Pink, SPEI   Chemistry      Component Value Date/Time   NA 139 02/12/2021 1403   NA 127 (L) 02/14/2017 1335   K 3.1 (L) 02/12/2021 1403   K 3.6 02/14/2017 1335   CL 95 (L) 02/12/2021 1403   CL 92 (L) 02/14/2017 1335   CO2 35 (H) 02/12/2021 1403   CO2 28 02/14/2017 1335   BUN 13 02/12/2021 1403   BUN 7 (L) 02/14/2017 1335   CREATININE 0.71 02/12/2021 1403   CREATININE 0.47 (L) 02/14/2017 1335   CREATININE 0.56 07/24/2014 1204      Component Value Date/Time   CALCIUM 9.7 02/12/2021 1403   CALCIUM 9.3 02/14/2017 1335   ALKPHOS 55 02/12/2021 1403   ALKPHOS 68 02/14/2017 1335   AST 20 02/12/2021 1403   ALT 12 02/12/2021 1403   BILITOT 0.8 02/12/2021 1403      Impression and Plan: Ms. 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    Her platelet count really is not a problem from my point of view.  She will always have some thrombocytopenia secondary to her NASH.  I am more worried about the anemia right now.  We will see what the erythropoietin level shows.  I would probably plan to get her back in another 4 to 5 weeks.  If we need to start her on erythropoietin sooner, we will certainly do that.      [name], MD 6/16/20223:23 PM

## 2021-02-13 LAB — ERYTHROPOIETIN: Erythropoietin: 133.9 m[IU]/mL — ABNORMAL HIGH (ref 2.6–18.5)

## 2021-02-17 DIAGNOSIS — J189 Pneumonia, unspecified organism: Secondary | ICD-10-CM | POA: Diagnosis not present

## 2021-02-17 DIAGNOSIS — E876 Hypokalemia: Secondary | ICD-10-CM | POA: Diagnosis not present

## 2021-02-19 ENCOUNTER — Other Ambulatory Visit: Payer: Self-pay | Admitting: Hematology & Oncology

## 2021-02-19 ENCOUNTER — Other Ambulatory Visit: Payer: Self-pay | Admitting: Family Medicine

## 2021-02-20 ENCOUNTER — Encounter: Payer: Self-pay | Admitting: Hematology & Oncology

## 2021-02-25 ENCOUNTER — Other Ambulatory Visit: Payer: Self-pay | Admitting: Family Medicine

## 2021-02-26 ENCOUNTER — Ambulatory Visit (HOSPITAL_BASED_OUTPATIENT_CLINIC_OR_DEPARTMENT_OTHER)
Admission: RE | Admit: 2021-02-26 | Discharge: 2021-02-26 | Disposition: A | Discharge status: Home / Self Care | Payer: PPO | Source: Ambulatory Visit | Attending: Hematology & Oncology | Admitting: Hematology & Oncology

## 2021-02-26 ENCOUNTER — Other Ambulatory Visit: Payer: Self-pay

## 2021-02-26 DIAGNOSIS — R911 Solitary pulmonary nodule: Secondary | ICD-10-CM | POA: Diagnosis not present

## 2021-02-26 DIAGNOSIS — E08641 Diabetes mellitus due to underlying condition with hypoglycemia with coma: Secondary | ICD-10-CM | POA: Insufficient documentation

## 2021-02-26 DIAGNOSIS — I700 Atherosclerosis of aorta: Secondary | ICD-10-CM | POA: Diagnosis not present

## 2021-02-26 DIAGNOSIS — R918 Other nonspecific abnormal finding of lung field: Secondary | ICD-10-CM | POA: Diagnosis not present

## 2021-02-26 NOTE — Telephone Encounter (Signed)
Requesting:alprazolam Contract: none UDS: none Last Visit: 12/24/20 Next Visit: 03/25/21 Last Refill: 11/14/20  Please Advise

## 2021-02-27 ENCOUNTER — Encounter: Payer: Self-pay | Admitting: *Deleted

## 2021-03-09 ENCOUNTER — Institutional Professional Consult (permissible substitution): Payer: PPO | Admitting: Neurology

## 2021-03-19 DIAGNOSIS — J189 Pneumonia, unspecified organism: Secondary | ICD-10-CM | POA: Diagnosis not present

## 2021-03-19 DIAGNOSIS — E876 Hypokalemia: Secondary | ICD-10-CM | POA: Diagnosis not present

## 2021-03-19 NOTE — Progress Notes (Deleted)
[place] at [facility] [address] [place], [place] [address] [phone]  Date:  03/25/2021   Name:  Rachael Buck   DOB:  24-Oct-1940   MRN:  [id number]  PCP:  [name], MD    Chief Complaint: No chief complaint on file.   History of Present Illness:  Rachael Buck is a 80 y.o. very pleasant female patient who presents with the following:  Pt seen today for a 3 month follow-up visit- history of diabetes, hypertension, IBS, [place], cirrhosis, hyperlipidemia, thrombocytopenia Last seen by myself in April for hospital follow-up: Acute Hypoxic Respiratory Failure - combination of Asthma/COPD and Severe Pneumonia in the setting of Morbid Obesity: - Oxygen requirements improved from 4L [place] to room air.  However, patient desaturated to 87% on her ambulatory pulse ox and was discharged with home oxygen.  Patient will follow up with a Pulmonologist outpatient. Post Viral Community Acquired Pneumonia:  - Chest CT showed severe RUL and RML infiltrates.  She received 5 days of Zosyn and Azithromycin per Pulmonary recommendations.  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   Thrombocytopenia ([place]) 02/15/2017   Varicose veins of left lower extremity with complications 70/48/8891   Age-related cognitive decline 06/02/2015   Fatigue 07/19/2014   Orthostatic hypotension 05/14/2013   Nail abnormality 09/04/2012   Bleeding disorder  ([place]) 05/08/2012   Varices, 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   NASH (nonalcoholic steatohepatitis) 03/21/2012   HTN (hypertension) 12/21/2011   Depression 03/28/2011   IBS (irritable bowel syndrome) 03/23/2011   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[place]) 08/27/2010   DYSPNEA ON EXERTION 07/27/2010   Uncontrolled diabetes mellitus ([place]) 07/24/2010   MIXED HYPERLIPIDEMIA 07/24/2010   Obesity, unspecified 07/24/2010   ALLERGIC RHINITIS CAUSE UNSPECIFIED 07/24/2010   REFLUX ESOPHAGITIS 07/24/2010   PAIN IN JOINT PELVIC REGION AND THIGH 07/24/2010    Past Medical History:  Diagnosis Date   Anxiety    Arthritis    Asthma    Cirrhosis, nonalcoholic ([place])    Colon polyps    Diabetes mellitus    Diverticulitis    GERD (gastroesophageal reflux disease)    Hyperlipidemia    Hypertension    NASH (nonalcoholic steatohepatitis)    Obesity    Pneumonia     Past Surgical History:  Procedure Laterality Date   CHOLECYSTECTOMY  1981   ENDOVENOUS ABLATION SAPHENOUS VEIN W/ LASER Left 02/21/2018   endovenous laser ablation left greater saphenous vein by Tinnie Gens MD    [place]     bilateral. 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   Procedure: LEFT TOTAL KNEE ARTHROPLASTY;  Surgeon: Netta Cedars, MD;  Location: [facility];  Service: Orthopedics;  Laterality: Left;   VEIN SURGERY      Social History  Tobacco Use   Smoking status: Former    Packs/day: 1.50    Years: 15.00    Pack years: 22.50    Types: Cigarettes   Smokeless tobacco: Never   Tobacco comments:    Quit 2002.  Vaping Use   Vaping Use: Never used  Substance Use Topics   Alcohol use: No   Drug use: No    Family History  Problem Relation Age of Onset   Breast cancer Daughter    Breast cancer Maternal Aunt    Diabetes Brother    Diabetes Sister    Stomach cancer Mother    Cancer  Other    GER disease Other    Obesity Other     Allergies  Allergen Reactions   Codeine Nausea And Vomiting   Metformin And Related Diarrhea   Shrimp [Shellfish Allergy] Swelling    Medication list has been reviewed and updated.  Current Outpatient Medications on File Prior to Visit  Medication Sig Dispense Refill   acetaminophen (TYLENOL) 500 MG tablet Take 500 mg by mouth every 6 (six) hours as needed for mild pain.     albuterol (PROAIR HFA) 108 (90 Base) MCG/ACT inhaler Inhale 2 puffs into the lungs every 4 (four) hours as needed for wheezing or shortness of breath. 1 Inhaler 6   ALPRAZolam (XANAX) 0.5 MG tablet TAKE 1 TABLET(0.5 MG) BY MOUTH THREE TIMES DAILY 90 tablet 2   blood glucose meter kit and supplies KIT Dispense based on patient and insurance preference. Use up to four times daily as directed. 1 each 0   colestipol (COLESTID) 1 g tablet Take 1 g by mouth daily.     dicyclomine (BENTYL) 20 MG tablet Take 1 tablet (20 mg total) by mouth 3 (three) times daily as needed. 360 tablet 0   diphenhydrAMINE (BENADRYL) 25 MG tablet Take 25 mg by mouth every 6 (six) hours as needed (for allergies.).     empagliflozin (JARDIANCE) 10 MG TABS tablet Take 1 tablet (10 mg total) by mouth daily. 90 tablet 3   glipiZIDE (GLUCOTROL XL) 5 MG 24 hr tablet Take 1 tablet (5 mg total) by mouth daily with breakfast. 90 tablet 3   hydrochlorothiazide (HYDRODIURIL) 25 MG tablet Take 1 tablet (25 mg total) by mouth daily. 90 tablet 1   hydrocortisone 2.5 % cream Apply 1 application topically daily as needed (itchy dry skin).     loperamide (IMODIUM) 2 MG capsule Take 4-6 mg by mouth daily as needed (for IBS symptoms).     Melatonin 10 MG TABS Take 10 mg by mouth at bedtime.     omeprazole (PRILOSEC) 40 MG capsule Take 1 capsule (40 mg total) by mouth daily. 90 capsule 1   ONETOUCH DELICA LANCETS FINE MISC 1 each by Other route daily. 100 each 12   ONETOUCH ULTRA test strip TEST BLOOD SUGAR(S) ONCE  DAILY IN THE MORNING 100 strip 12   potassium chloride SA (KLOR-CON M20) 20 MEQ tablet Take 1 tablet (20 mEq total) by mouth daily. 90 tablet 3   sertraline (ZOLOFT) 100 MG tablet Take 1 tablet (100 mg total) by mouth daily. 90 tablet 3   simvastatin (ZOCOR) 80 MG tablet TAKE 1 TABLET(80 MG) BY MOUTH DAILY 90 tablet 1   Tiotropium Bromide-Olodaterol (STIOLTO RESPIMAT) 2.5-2.5 MCG/ACT AERS Inhale 2 puffs into the lungs daily. (Patient taking differently: Inhale 2 puffs into the lungs daily. As needed) 4 g 0   traMADol (ULTRAM) 50 MG tablet TAKE 1 TABLET(50  MG) BY MOUTH EVERY 6 HOURS AS NEEDED 60 tablet 0   No current facility-administered medications on file prior to visit.    Review of Systems:  As per HPI- otherwise negative.   Physical Examination: There were no vitals filed for this visit. There were no vitals filed for this visit. There is no height or weight on file to calculate BMI. Ideal Body Weight:    GEN: no acute distress. HEENT: Atraumatic, Normocephalic.  Ears and Nose: No external deformity. CV: RRR, No M/G/R. No JVD. No thrill. No extra heart sounds. PULM: CTA B, no wheezes, crackles, rhonchi. No retractions. No resp. distress. No accessory muscle use. ABD: S, NT, ND, +BS. No rebound. No HSM. EXTR: No c/c/e PSYCH: Normally interactive. Conversant.    Assessment and Plan: ***  This visit occurred during the SARS-CoV-2 public health emergency.  Safety protocols were in place, including screening questions prior to the visit, additional usage of staff PPE, and extensive cleaning of exam room while observing appropriate contact time as indicated for disinfecting solutions.   Signed Lamar Blinks, MD

## 2021-03-20 ENCOUNTER — Telehealth: Payer: Self-pay

## 2021-03-20 ENCOUNTER — Other Ambulatory Visit: Payer: Self-pay

## 2021-03-20 ENCOUNTER — Encounter: Payer: Self-pay | Admitting: Hematology & Oncology

## 2021-03-20 ENCOUNTER — Inpatient Hospital Stay: Payer: PPO | Admitting: Hematology & Oncology

## 2021-03-20 ENCOUNTER — Inpatient Hospital Stay: Payer: PPO | Attending: Family

## 2021-03-20 VITALS — BP 147/60 | HR 92 | Temp 98.3°F | Resp 20 | Wt 189.0 lb

## 2021-03-20 DIAGNOSIS — D649 Anemia, unspecified: Secondary | ICD-10-CM | POA: Diagnosis not present

## 2021-03-20 DIAGNOSIS — E08641 Diabetes mellitus due to underlying condition with hypoglycemia with coma: Secondary | ICD-10-CM

## 2021-03-20 DIAGNOSIS — R911 Solitary pulmonary nodule: Secondary | ICD-10-CM

## 2021-03-20 DIAGNOSIS — D696 Thrombocytopenia, unspecified: Secondary | ICD-10-CM | POA: Diagnosis not present

## 2021-03-20 DIAGNOSIS — K7581 Nonalcoholic steatohepatitis (NASH): Secondary | ICD-10-CM | POA: Diagnosis not present

## 2021-03-20 DIAGNOSIS — R161 Splenomegaly, not elsewhere classified: Secondary | ICD-10-CM | POA: Diagnosis not present

## 2021-03-20 LAB — RETICULOCYTES
Immature Retic Fract: 20.9 % — ABNORMAL HIGH (ref 2.3–15.9)
RBC.: 4.23 MIL/uL (ref 3.87–5.11)
Retic Count, Absolute: 92.2 10*3/uL (ref 19.0–186.0)
Retic Ct Pct: 2.2 % (ref 0.4–3.1)

## 2021-03-20 LAB — CBC WITH DIFFERENTIAL (CANCER CENTER ONLY)
Abs Immature Granulocytes: 0.02 10*3/uL (ref 0.00–0.07)
Basophils Absolute: 0 10*3/uL (ref 0.0–0.1)
Basophils Relative: 0 %
Eosinophils Absolute: 0 10*3/uL (ref 0.0–0.5)
Eosinophils Relative: 1 %
HCT: 34.3 % — ABNORMAL LOW (ref 36.0–46.0)
Hemoglobin: 11 g/dL — ABNORMAL LOW (ref 12.0–15.0)
Immature Granulocytes: 0 %
Lymphocytes Relative: 24 %
Lymphs Abs: 1.1 10*3/uL (ref 0.7–4.0)
MCH: 25.6 pg — ABNORMAL LOW (ref 26.0–34.0)
MCHC: 32.1 g/dL (ref 30.0–36.0)
MCV: 79.8 fL — ABNORMAL LOW (ref 80.0–100.0)
Monocytes Absolute: 0.5 10*3/uL (ref 0.1–1.0)
Monocytes Relative: 10 %
Neutro Abs: 3 10*3/uL (ref 1.7–7.7)
Neutrophils Relative %: 65 %
Platelet Count: 64 10*3/uL — ABNORMAL LOW (ref 150–400)
RBC: 4.3 MIL/uL (ref 3.87–5.11)
RDW: 16.2 % — ABNORMAL HIGH (ref 11.5–15.5)
WBC Count: 4.6 10*3/uL (ref 4.0–10.5)
nRBC: 0 % (ref 0.0–0.2)

## 2021-03-20 LAB — CMP (CANCER CENTER ONLY)
ALT: 11 U/L (ref 0–44)
AST: 18 U/L (ref 15–41)
Albumin: 4.4 g/dL (ref 3.5–5.0)
Alkaline Phosphatase: 57 U/L (ref 38–126)
Anion gap: 7 (ref 5–15)
BUN: 13 mg/dL (ref 8–23)
CO2: 38 mmol/L — ABNORMAL HIGH (ref 22–32)
Calcium: 9.9 mg/dL (ref 8.9–10.3)
Chloride: 95 mmol/L — ABNORMAL LOW (ref 98–111)
Creatinine: 0.64 mg/dL (ref 0.44–1.00)
GFR, Estimated: >60 mL/min (ref >60)
Glucose, Bld: 202 mg/dL — ABNORMAL HIGH (ref 70–99)
Potassium: 3.5 mmol/L (ref 3.5–5.1)
Sodium: 140 mmol/L (ref 135–145)
Total Bilirubin: 0.9 mg/dL (ref 0.3–1.2)
Total Protein: 7.3 g/dL (ref 6.5–8.1)

## 2021-03-20 LAB — IRON AND TIBC
Iron: 48 ug/dL (ref 41–142)
Saturation Ratios: 11 % — ABNORMAL LOW (ref 21–57)
TIBC: 449 ug/dL — ABNORMAL HIGH (ref 236–444)
UIBC: 400 ug/dL — ABNORMAL HIGH (ref 120–384)

## 2021-03-20 LAB — FERRITIN: Ferritin: 14 ng/mL (ref 11–307)

## 2021-03-20 NOTE — Progress Notes (Signed)
Hematology and Oncology Follow Up Visit  Rachael Buck [id number] 1940/10/26 80 y.o. 03/20/2021   Principle Diagnosis:  Thrombocytopenia -- NASH/Splenomegaly ; Immune based thrombocytopenia Iron deficiency    Current Therapy:        Nplate as indicated  IV iron as indicated    Interim History:  Rachael Buck is here today for follow-up.  She says that she feels pretty well.  She still has a bit of tiredness.  I am sure that she is iron deficient.  Her MCV on her red cells are going down.  We did check an erythropoietin level on her.  It was 133.  As such, I am not sure how much all ESA could help [place] out.  She did have a follow-up CT scan done because of this right lower lobe nodule.  It actually is smaller in size.  The nodule now measures 1.4 x 1.1 cm.   She is still watching her blood sugars.  She said her fasting blood sugar today was 97.  In the office, it was 202.  She had a little bit of breakfast.  She has had no bleeding.  There is been no fever.  She has had no rashes.  There is been no leg swelling.  Overall, I would say her performance status is probably ECOG 2.    Medications:  Allergies as of 03/20/2021       Reactions   Codeine Nausea And Vomiting   Metformin And Related Diarrhea   Shrimp [shellfish Allergy] Swelling        Medication List        Accurate as of March 20, 2021  8:56 AM. If you have any questions, ask your nurse or doctor.          acetaminophen 500 MG tablet Commonly known as: TYLENOL Take 500 mg by mouth every 6 (six) hours as needed for mild pain.   albuterol 108 (90 Base) MCG/ACT inhaler Commonly known as: ProAir HFA Inhale 2 puffs into the lungs every 4 (four) hours as needed for wheezing or shortness of breath.   ALPRAZolam 0.5 MG tablet Commonly known as: XANAX TAKE 1 TABLET(0.5 MG) BY MOUTH THREE TIMES DAILY   blood glucose meter kit and supplies Kit Dispense based on patient and insurance preference. Use up to four  times daily as directed.   colestipol 1 g tablet Commonly known as: COLESTID Take 1 g by mouth daily.   dicyclomine 20 MG tablet Commonly known as: BENTYL Take 1 tablet (20 mg total) by mouth 3 (three) times daily as needed.   diphenhydrAMINE 25 MG tablet Commonly known as: BENADRYL Take 25 mg by mouth every 6 (six) hours as needed (for allergies.).   empagliflozin 10 MG Tabs tablet Commonly known as: JARDIANCE Take 1 tablet (10 mg total) by mouth daily.   glipiZIDE 5 MG 24 hr tablet Commonly known as: GLUCOTROL XL Take 1 tablet (5 mg total) by mouth daily with breakfast.   hydrochlorothiazide 25 MG tablet Commonly known as: HYDRODIURIL Take 1 tablet (25 mg total) by mouth daily.   hydrocortisone 2.5 % cream Apply 1 application topically daily as needed (itchy dry skin).   loperamide 2 MG capsule Commonly known as: IMODIUM Take 4-6 mg by mouth daily as needed (for IBS symptoms).   Melatonin 10 MG Tabs Take 10 mg by mouth at bedtime.   omeprazole 40 MG capsule Commonly known as: PRILOSEC Take 1 capsule (40 mg total) by mouth daily.  OneTouch Delica Lancets Fine Misc 1 each by Other route daily.   OneTouch Ultra test strip Generic drug: glucose blood TEST BLOOD SUGAR(S) ONCE DAILY IN THE MORNING   potassium chloride SA 20 MEQ tablet Commonly known as: [place]-Con M20 Take 1 tablet (20 mEq total) by mouth daily.   sertraline 100 MG tablet Commonly known as: ZOLOFT Take 1 tablet (100 mg total) by mouth daily.   simvastatin 80 MG tablet Commonly known as: ZOCOR TAKE 1 TABLET(80 MG) BY MOUTH DAILY   Stiolto Respimat 2.5-2.5 MCG/ACT Aers Generic drug: Tiotropium Bromide-Olodaterol Inhale 2 puffs into the lungs daily.   traMADol 50 MG tablet Commonly known as: ULTRAM TAKE 1 TABLET(50 MG) BY MOUTH EVERY 6 HOURS AS NEEDED        Allergies:  Allergies  Allergen Reactions   Codeine Nausea And Vomiting   Metformin And Related Diarrhea   Shrimp [Shellfish  Allergy] Swelling    Past Medical History, Surgical history, Social history, and Family History were reviewed and updated.  Review of Systems: Review of Systems  Constitutional:  Positive for malaise/fatigue.  HENT: Negative.    Eyes: Negative.   Respiratory: Negative.    Cardiovascular: Negative.   Gastrointestinal:  Positive for abdominal pain.  Genitourinary: Negative.   Musculoskeletal: Negative.   Skin: Negative.   Neurological: Negative.   Endo/Heme/Allergies: Negative.   Psychiatric/Behavioral: Negative.      Physical Exam:  weight is 189 lb (85.7 kg). Her oral temperature is 98.3 F (36.8 C). Her blood pressure is 147/60 (abnormal) and her pulse is 92. Her respiration is 20.   Wt Readings from Last 3 Encounters:  03/20/21 189 lb (85.7 kg)  02/12/21 189 lb (85.7 kg)  01/13/21 186 lb 3.2 oz (84.5 kg)    Physical Exam Vitals reviewed.  HENT:     Head: Normocephalic and atraumatic.  Eyes:     Pupils: Pupils are equal, round, and reactive to light.  Cardiovascular:     Rate and Rhythm: Normal rate and regular rhythm.     Heart sounds: Normal heart sounds.  Pulmonary:     Effort: Pulmonary effort is normal.     Breath sounds: Normal breath sounds.  Abdominal:     General: Bowel sounds are normal.     Palpations: Abdomen is soft.  Musculoskeletal:        General: No tenderness or deformity. Normal range of motion.     Cervical back: Normal range of motion.  Lymphadenopathy:     Cervical: No cervical adenopathy.  Skin:    General: Skin is warm and dry.     Findings: No erythema or rash.  Neurological:     Mental Status: She is alert and oriented to person, place, and time.  Psychiatric:        Behavior: Behavior normal.        Thought Content: Thought content normal.        Judgment: Judgment normal.     Lab Results  Component Value Date   WBC 4.6 03/20/2021   HGB 11.0 (L) 03/20/2021   HCT 34.3 (L) 03/20/2021   MCV 79.8 (L) 03/20/2021   PLT 64 (L)  03/20/2021   Lab Results  Component Value Date   FERRITIN 12 11/20/2020   IRON 69 11/20/2020   TIBC 496 (H) 11/20/2020   UIBC 427 (H) 11/20/2020   IRONPCTSAT 14 (L) 11/20/2020   Lab Results  Component Value Date   RETICCTPCT 2.2 03/20/2021   RBC 4.30 03/20/2021  RBC 4.23 03/20/2021   No results found for: KPAFRELGTCHN, LAMBDASER, KAPLAMBRATIO No results found for: IGGSERUM, IGA, IGMSERUM No results found for: Odetta Pink, SPEI   Chemistry      Component Value Date/Time   NA 140 03/20/2021 0819   NA 127 (L) 02/14/2017 1335   K 3.5 03/20/2021 0819   K 3.6 02/14/2017 1335   CL 95 (L) 03/20/2021 0819   CL 92 (L) 02/14/2017 1335   CO2 38 (H) 03/20/2021 0819   CO2 28 02/14/2017 1335   BUN 13 03/20/2021 0819   BUN 7 (L) 02/14/2017 1335   CREATININE 0.64 03/20/2021 0819   CREATININE 0.47 (L) 02/14/2017 1335   CREATININE 0.56 07/24/2014 1204      Component Value Date/Time   CALCIUM 9.9 03/20/2021 0819   CALCIUM 9.3 02/14/2017 1335   ALKPHOS 57 03/20/2021 0819   ALKPHOS 68 02/14/2017 1335   AST 18 03/20/2021 0819   ALT 11 03/20/2021 0819   BILITOT 0.9 03/20/2021 0819      Impression and Plan: Rachael Buck is a very pleasant 79 yo caucasian female with 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    Her platelet count really is not a problem from my point of view.  Is holding steady.  The anemia is likely a combination of the erythropoietin and iron deficiency.  Again, I am sure her iron is going to be on the low side.  We will see with the level is.  At some point, we probably will have to follow-up on another ultrasound of her spleen so we see the size.  I probably would do another ultrasound of her spleen sometime in the fall.  I would like to get her back in another 6 weeks.     [name], MD 7/22/20228:56 AM

## 2021-03-20 NOTE — Telephone Encounter (Signed)
Appts made per 03/20/21 los, pt to gain sch at [facility] and through [employer]

## 2021-03-23 ENCOUNTER — Telehealth: Payer: Self-pay | Admitting: *Deleted

## 2021-03-23 NOTE — Telephone Encounter (Signed)
Patient changed to 04/01/21 at 11am

## 2021-03-23 NOTE — Telephone Encounter (Signed)
Caller Name [facility] Phone Number [phone] Patient Name Rachael Buck Patient DOB 1941-02-06 Call Type Message Only Information Provided Reason for Call Request to Reschedule Office Appointment Initial Comment Caller states she is needing to reschedule her appointment pm 03/25/2021 Patient request to speak to RN No Additional Comment Provided office hours Disp. Time Disposition Final User 03/23/2021 7:50:02 AM General Information Provided Yes [place], Baxter Flattery

## 2021-03-25 ENCOUNTER — Ambulatory Visit: Payer: PPO | Admitting: Family Medicine

## 2021-03-25 DIAGNOSIS — E119 Type 2 diabetes mellitus without complications: Secondary | ICD-10-CM

## 2021-03-25 DIAGNOSIS — K7460 Unspecified cirrhosis of liver: Secondary | ICD-10-CM

## 2021-03-25 DIAGNOSIS — I10 Essential (primary) hypertension: Secondary | ICD-10-CM

## 2021-03-25 DIAGNOSIS — R0902 Hypoxemia: Secondary | ICD-10-CM

## 2021-03-25 DIAGNOSIS — G479 Sleep disorder, unspecified: Secondary | ICD-10-CM

## 2021-03-25 DIAGNOSIS — E782 Mixed hyperlipidemia: Secondary | ICD-10-CM

## 2021-03-27 ENCOUNTER — Other Ambulatory Visit: Payer: Self-pay

## 2021-03-27 ENCOUNTER — Inpatient Hospital Stay: Payer: PPO

## 2021-03-27 VITALS — BP 132/66 | HR 68 | Temp 98.2°F | Resp 18

## 2021-03-27 DIAGNOSIS — D696 Thrombocytopenia, unspecified: Secondary | ICD-10-CM

## 2021-03-27 MED ORDER — SODIUM CHLORIDE 0.9 % IV SOLN
200.0000 mg | Freq: Once | INTRAVENOUS | Status: AC
  Administered 2021-03-27: 200 mg via INTRAVENOUS
  Filled 2021-03-27: qty 200

## 2021-03-27 MED ORDER — SODIUM CHLORIDE 0.9 % IV SOLN
INTRAVENOUS | Status: DC
  Filled 2021-03-27: qty 250

## 2021-03-27 NOTE — Patient Instructions (Signed)

## 2021-03-31 NOTE — Progress Notes (Signed)
Rachael Buck at [facility] [address], [place], [place] [address] [phone]  Date:  04/01/2021   Name:  [name]   DOB:  1941/03/21   MRN:  [id number]  PCP:  Rachael Mclean, MD    Chief Complaint: Diabetes (3 month follow up/)   History of Present Illness:  Rachael Buck is a 80 y.o. very pleasant female patient who presents with the following:  History of diabetes, hypertension, IBS, Rachael Buck, cirrhosis, hyperlipidemia, thrombocytopenia Here today for periodic follow-up visit Most recent visit with myself in April-that time she had recently been admitted to the hospital for about a week with pneumonia, COPD exacerbation and respiratory failure She was discharged home with oxygen at that time  Visit with pulmonology, Rachael Buck in May Pulmonary nodule 1 cm or greater in diameter Question whether this may have been reactive lymphadenopathy in the setting of severe right-sided pneumonia, especially given his perihilar location.  She does have a tobacco history, is at risk for primary malignancy.  She needs a repeat CT chest in July.  It looks like this has already been ordered by Rachael Buck.  I will see her after the CT to review, determine the risk associated with the nodule depending on its appearance.  If we believe it merits tissue diagnosis then we can discuss bronchoscopy. 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  She does not exacerbate frequently so I think we can try using LABA/LAMA without the ICS component.  We will do a trial of Stiolto to see if she gets benefit.  She had a repeat CT chest in June: IMPRESSION: 1. Near complete resolution of the right upper and middle lobe consolidation and ground-glass infiltrate now with a tiny amount of residual ground-glass and a few tiny pulmonary nodules measuring 2-3 mm or smaller.  Findings likely reflect a resolving infectious or inflammatory process. 2. Previously described noncalcified nonenhancing soft tissue nodule today measuring approximately 1.4 x 1.1 cm previously 1.9 x 1.3 cm on prior CTA chest, the nodular area is well-circumscribed and surrounds the right inferior pulmonary vein as it drains into the left atrium, consistent with benign anatomic fluid in a pericardial recess/serosal sleeve. 3. Cirrhotic hepatic morphology with splenomegaly. 4. Enlargement of the central pulmonary arteries with the main pulmonary artery measuring 3.3 cm, as can be seen with pulmonary artery hypertension. 5.  Aortic Atherosclerosis (ICD10-I70.0).  She was seen by oncology, Rachael Buck about 2 weeks ago platelets okay.  She was given NPLATE (iron) infusion on July 29  Oxygen use: she stopped after about 2 weeks  Mammogram; she is overdue, I will order for her  Colon cancer screening now due-she is on 5-year follow-up- she would prefer to transfer her care to [facility].  I placed a referral for her today.  She needs both screening colonoscopy and also surveillance of her Rachael Buck COVID-19 booster-recommend fourth dose Eye exam- this is due  Can update A1c if desired Lipid profile done in March  She has not yet discussed her follow-up CT with her pulmonologist, Rachael Buck.  I agree that her recent CT does look reassuring, but I advised her that pulmonology follow-up is still a good idea to make sure were not missing anything Lab Results  Component Value Date   HGBA1C 6.7 (H) 10/29/2020   Diabetes-Jardiance 10, glipizide XL 5 Zocor HCTZ 25, potassium 20 mill  equivalents daily Zoloft Stiolto Respimat, albuterol as needed  Patient Active Problem List   Diagnosis Date Noted   Pulmonary nodule 1 cm or greater in diameter 01/13/2021   Severe sepsis ([place]) 12/09/2020   Hypokalemia 12/09/2020   Cirrhosis of liver ([place]) 02/17/2018   S/P total knee replacement, 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    Thrombocytopenia ([place]) 02/15/2017   Varicose veins of left lower extremity with complications 83/41/9622   Age-related cognitive decline 06/02/2015   Fatigue 07/19/2014   Orthostatic hypotension 05/14/2013   Nail abnormality 09/04/2012   Bleeding disorder ([place]) 05/08/2012   Varices, 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   NASH (nonalcoholic steatohepatitis) 03/21/2012   HTN (hypertension) 12/21/2011   Depression 03/28/2011   IBS (irritable bowel syndrome) 03/23/2011   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[place]) 08/27/2010   DYSPNEA ON EXERTION 07/27/2010   Uncontrolled diabetes mellitus ([place]) 07/24/2010   MIXED HYPERLIPIDEMIA 07/24/2010   Obesity, unspecified 07/24/2010   ALLERGIC RHINITIS CAUSE UNSPECIFIED 07/24/2010   REFLUX ESOPHAGITIS 07/24/2010   PAIN IN JOINT PELVIC REGION AND THIGH 07/24/2010    Past Medical History:  Diagnosis Date   Anxiety    Arthritis    Asthma    Cirrhosis, nonalcoholic ([place])    Colon polyps    Diabetes mellitus    Diverticulitis    GERD (gastroesophageal reflux disease)    Hyperlipidemia    Hypertension    NASH (nonalcoholic steatohepatitis)    Obesity    Pneumonia     Past Surgical History:  Procedure Laterality Date   CHOLECYSTECTOMY  1981   ENDOVENOUS ABLATION SAPHENOUS VEIN W/ LASER Left 02/21/2018   endovenous laser ablation left greater saphenous vein by Tinnie Gens MD    [place]     bilateral. 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   Procedure: LEFT TOTAL KNEE ARTHROPLASTY;  Surgeon: Netta Cedars, MD;  Location: [facility];  Service: Orthopedics;  Laterality: Left;   VEIN SURGERY      Social History   Tobacco Use   Smoking status: Former    Packs/day: 1.50    Years: 15.00    Pack years: 22.50    Types: Cigarettes   Smokeless tobacco: Never   Tobacco comments:    Quit 2002.  Vaping Use   Vaping  Use: Never used  Substance Use Topics   Alcohol use: No   Drug use: No    Family History  Problem Relation Age of Onset   Breast cancer Daughter    Breast cancer Maternal Aunt    Diabetes Brother    Diabetes Sister    Stomach cancer Mother    Cancer Other    GER disease Other    Obesity Other     Allergies  Allergen Reactions   Codeine Nausea And Vomiting   Metformin And Related Diarrhea   Shrimp [Shellfish Allergy] Swelling    Medication list has been reviewed and updated.  Current Outpatient Medications on File Prior to Visit  Medication Sig Dispense Refill   acetaminophen (TYLENOL) 500 MG tablet Take 500 mg by mouth every 6 (six) hours as needed for mild pain.     albuterol (PROAIR HFA) 108 (90 Base) MCG/ACT inhaler Inhale 2 puffs into the lungs every 4 (four) hours as needed for wheezing or shortness of breath. 1 Inhaler 6   ALPRAZolam (XANAX) 0.5 MG tablet TAKE 1 TABLET(0.5 MG) BY MOUTH  THREE TIMES DAILY 90 tablet 2   blood glucose meter kit and supplies KIT Dispense based on patient and insurance preference. Use up to four times daily as directed. 1 each 0   colestipol (COLESTID) 1 g tablet Take 1 g by mouth daily.     dicyclomine (BENTYL) 20 MG tablet Take 1 tablet (20 mg total) by mouth 3 (three) times daily as needed. 360 tablet 0   diphenhydrAMINE (BENADRYL) 25 MG tablet Take 25 mg by mouth every 6 (six) hours as needed (for allergies.).     empagliflozin (JARDIANCE) 10 MG TABS tablet Take 1 tablet (10 mg total) by mouth daily. 90 tablet 3   glipiZIDE (GLUCOTROL XL) 5 MG 24 hr tablet Take 1 tablet (5 mg total) by mouth daily with breakfast. 90 tablet 3   hydrochlorothiazide (HYDRODIURIL) 25 MG tablet Take 1 tablet (25 mg total) by mouth daily. 90 tablet 1   hydrocortisone 2.5 % cream Apply 1 application topically daily as needed (itchy dry skin).     loperamide (IMODIUM) 2 MG capsule Take 4-6 mg by mouth daily as needed (for IBS symptoms).     Melatonin 10 MG TABS  Take 10 mg by mouth at bedtime.     omeprazole (PRILOSEC) 40 MG capsule Take 1 capsule (40 mg total) by mouth daily. 90 capsule 1   ONETOUCH DELICA LANCETS FINE MISC 1 each by Other route daily. 100 each 12   ONETOUCH ULTRA test strip TEST BLOOD SUGAR(S) ONCE DAILY IN THE MORNING 100 strip 12   potassium chloride SA (KLOR-CON M20) 20 MEQ tablet Take 1 tablet (20 mEq total) by mouth daily. 90 tablet 3   sertraline (ZOLOFT) 100 MG tablet Take 1 tablet (100 mg total) by mouth daily. 90 tablet 3   simvastatin (ZOCOR) 80 MG tablet TAKE 1 TABLET(80 MG) BY MOUTH DAILY 90 tablet 1   Tiotropium Bromide-Olodaterol (STIOLTO RESPIMAT) 2.5-2.5 MCG/ACT AERS Inhale 2 puffs into the lungs daily. 4 g 0   traMADol (ULTRAM) 50 MG tablet TAKE 1 TABLET(50 MG) BY MOUTH EVERY 6 HOURS AS NEEDED 60 tablet 0   No current facility-administered medications on file prior to visit.    Review of Systems:  As per HPI- otherwise negative.   Physical Examination: Vitals:   04/01/21 1106  BP: (!) 152/82  Pulse: 97  Resp: 19  SpO2: 94%   Vitals:   04/01/21 1106  Weight: 186 lb (84.4 kg)  Height: 5' 6"  (1.676 m)   Body mass index is 30.02 kg/m. Ideal Body Weight: Weight in (lb) to have BMI = 25: 154.6  GEN: no acute distress. Overweight, looks well  HEENT: Atraumatic, Normocephalic.  Ears and Nose: No external deformity. CV: RRR, No M/G/R. No JVD. No thrill. No extra heart sounds. PULM: CTA B, no wheezes, crackles, rhonchi. No retractions. No resp. distress. No accessory muscle use. ABD: S, NT, ND, +BS. No rebound.  Enlarged liver is apparent on exam EXTR: No c/c/e PSYCH: Normally interactive. Conversant.    Assessment and Plan: Controlled type 2 diabetes mellitus without complication, without long-term current use of insulin ([place]) - Plan: Hemoglobin A1c  Oxygen desaturation  Essential hypertension  Abnormal chest CT  Mixed hyperlipidemia  Encounter for screening mammogram for malignant neoplasm  of breast - Plan: MM 3D SCREEN BREAST BILATERAL  Screening for colon cancer - Plan: Ambulatory referral to Gastroenterology, CANCELED: Ambulatory referral to Gastroenterology  Medication monitoring encounter - Plan: CANCELED: Basic metabolic panel  NASH (nonalcoholic steatohepatitis) - Plan:  Ambulatory referral to Gastroenterology  Following up on diabetes, A1c pending Placed referral to gastroenterology for screening colonoscopy and to discuss her chronic NASH Her low platelets are being managed by hematology Ordered mammogram which is overdue Recommended vaccine update Will plan further follow- up pending labs.  This visit occurred during the SARS-CoV-2 public health emergency.  Safety protocols were in place, including screening questions prior to the visit, additional usage of staff PPE, and extensive cleaning of exam room while observing appropriate contact time as indicated for disinfecting solutions.   Signed Lamar Blinks, MD

## 2021-03-31 NOTE — Patient Instructions (Addendum)
It was good to see you again today!  I ordered a mammogram for you to be done at imaging on the ground floor Please call GI and set up an appt to discuss colonoscopy If not done already please get your covid booster/ 4th dose  Your pharmacy can also give you a tetanus booster and shingles vaccine at your convenience  I would recommend discussing your CT san with Dr Lamonte Sakai but I do think your recent scan is reassuring

## 2021-04-01 ENCOUNTER — Encounter: Payer: Self-pay | Admitting: Family Medicine

## 2021-04-01 ENCOUNTER — Other Ambulatory Visit: Payer: Self-pay

## 2021-04-01 ENCOUNTER — Ambulatory Visit (INDEPENDENT_AMBULATORY_CARE_PROVIDER_SITE_OTHER): Payer: PPO | Admitting: Family Medicine

## 2021-04-01 VITALS — BP 152/82 | HR 97 | Resp 19 | Ht 66.0 in | Wt 186.0 lb

## 2021-04-01 DIAGNOSIS — I10 Essential (primary) hypertension: Secondary | ICD-10-CM

## 2021-04-01 DIAGNOSIS — E119 Type 2 diabetes mellitus without complications: Secondary | ICD-10-CM

## 2021-04-01 DIAGNOSIS — E782 Mixed hyperlipidemia: Secondary | ICD-10-CM

## 2021-04-01 DIAGNOSIS — K7581 Nonalcoholic steatohepatitis (NASH): Secondary | ICD-10-CM

## 2021-04-01 DIAGNOSIS — Z5181 Encounter for therapeutic drug level monitoring: Secondary | ICD-10-CM | POA: Diagnosis not present

## 2021-04-01 DIAGNOSIS — Z1211 Encounter for screening for malignant neoplasm of colon: Secondary | ICD-10-CM | POA: Diagnosis not present

## 2021-04-01 DIAGNOSIS — R0902 Hypoxemia: Secondary | ICD-10-CM | POA: Diagnosis not present

## 2021-04-01 DIAGNOSIS — Z1231 Encounter for screening mammogram for malignant neoplasm of breast: Secondary | ICD-10-CM

## 2021-04-01 DIAGNOSIS — R9389 Abnormal findings on diagnostic imaging of other specified body structures: Secondary | ICD-10-CM | POA: Diagnosis not present

## 2021-04-01 LAB — HEMOGLOBIN A1C: Hgb A1c MFr Bld: 6.9 % — ABNORMAL HIGH (ref 4.6–6.5)

## 2021-04-02 ENCOUNTER — Other Ambulatory Visit: Payer: Self-pay | Admitting: Hematology & Oncology

## 2021-04-19 DIAGNOSIS — J189 Pneumonia, unspecified organism: Secondary | ICD-10-CM | POA: Diagnosis not present

## 2021-04-19 DIAGNOSIS — E876 Hypokalemia: Secondary | ICD-10-CM | POA: Diagnosis not present

## 2021-04-29 ENCOUNTER — Inpatient Hospital Stay: Payer: PPO | Attending: Family

## 2021-04-29 ENCOUNTER — Other Ambulatory Visit: Payer: Self-pay | Admitting: *Deleted

## 2021-04-29 ENCOUNTER — Other Ambulatory Visit: Payer: Self-pay

## 2021-04-29 ENCOUNTER — Inpatient Hospital Stay: Payer: PPO | Admitting: Hematology & Oncology

## 2021-04-29 ENCOUNTER — Encounter: Payer: Self-pay | Admitting: Hematology & Oncology

## 2021-04-29 ENCOUNTER — Telehealth: Payer: Self-pay | Admitting: *Deleted

## 2021-04-29 VITALS — BP 131/66 | HR 95 | Temp 98.6°F | Resp 17 | Ht 66.0 in | Wt 186.0 lb

## 2021-04-29 DIAGNOSIS — D509 Iron deficiency anemia, unspecified: Secondary | ICD-10-CM | POA: Insufficient documentation

## 2021-04-29 DIAGNOSIS — R161 Splenomegaly, not elsewhere classified: Secondary | ICD-10-CM | POA: Diagnosis not present

## 2021-04-29 DIAGNOSIS — Z79899 Other long term (current) drug therapy: Secondary | ICD-10-CM | POA: Diagnosis not present

## 2021-04-29 DIAGNOSIS — D696 Thrombocytopenia, unspecified: Secondary | ICD-10-CM | POA: Insufficient documentation

## 2021-04-29 DIAGNOSIS — K7581 Nonalcoholic steatohepatitis (NASH): Secondary | ICD-10-CM | POA: Diagnosis not present

## 2021-04-29 DIAGNOSIS — M25552 Pain in left hip: Secondary | ICD-10-CM | POA: Diagnosis not present

## 2021-04-29 LAB — RETICULOCYTES
Immature Retic Fract: 14.9 % (ref 2.3–15.9)
RBC.: 4.8 MIL/uL (ref 3.87–5.11)
Retic Count, Absolute: 100.8 10*3/uL (ref 19.0–186.0)
Retic Ct Pct: 2.1 % (ref 0.4–3.1)

## 2021-04-29 LAB — CBC WITH DIFFERENTIAL (CANCER CENTER ONLY)
Abs Immature Granulocytes: 0.05 10*3/uL (ref 0.00–0.07)
Basophils Absolute: 0 10*3/uL (ref 0.0–0.1)
Basophils Relative: 1 %
Eosinophils Absolute: 0 10*3/uL (ref 0.0–0.5)
Eosinophils Relative: 1 %
HCT: 37 % (ref 36.0–46.0)
Hemoglobin: 12.1 g/dL (ref 12.0–15.0)
Immature Granulocytes: 1 %
Lymphocytes Relative: 26 %
Lymphs Abs: 1.3 10*3/uL (ref 0.7–4.0)
MCH: 25.5 pg — ABNORMAL LOW (ref 26.0–34.0)
MCHC: 32.7 g/dL (ref 30.0–36.0)
MCV: 78.1 fL — ABNORMAL LOW (ref 80.0–100.0)
Monocytes Absolute: 0.5 10*3/uL (ref 0.1–1.0)
Monocytes Relative: 10 %
Neutro Abs: 3.1 10*3/uL (ref 1.7–7.7)
Neutrophils Relative %: 61 %
Platelet Count: 74 10*3/uL — ABNORMAL LOW (ref 150–400)
RBC: 4.74 MIL/uL (ref 3.87–5.11)
RDW: 17.4 % — ABNORMAL HIGH (ref 11.5–15.5)
WBC Count: 5 10*3/uL (ref 4.0–10.5)
nRBC: 0 % (ref 0.0–0.2)

## 2021-04-29 LAB — CMP (CANCER CENTER ONLY)
ALT: 12 U/L (ref 0–44)
AST: 22 U/L (ref 15–41)
Albumin: 4.5 g/dL (ref 3.5–5.0)
Alkaline Phosphatase: 50 U/L (ref 38–126)
Anion gap: 11 (ref 5–15)
BUN: 12 mg/dL (ref 8–23)
CO2: 32 mmol/L (ref 22–32)
Calcium: 9.7 mg/dL (ref 8.9–10.3)
Chloride: 96 mmol/L — ABNORMAL LOW (ref 98–111)
Creatinine: 0.64 mg/dL (ref 0.44–1.00)
GFR, Estimated: >60 mL/min (ref >60)
Glucose, Bld: 140 mg/dL — ABNORMAL HIGH (ref 70–99)
Potassium: 3.1 mmol/L — ABNORMAL LOW (ref 3.5–5.1)
Sodium: 139 mmol/L (ref 135–145)
Total Bilirubin: 1.2 mg/dL (ref 0.3–1.2)
Total Protein: 7.7 g/dL (ref 6.5–8.1)

## 2021-04-29 MED ORDER — TRAMADOL HCL 50 MG PO TABS: ORAL_TABLET | ORAL | 0 refills | Status: DC

## 2021-04-29 NOTE — Progress Notes (Signed)
Hematology and Oncology Follow Up Visit  Rachael Buck [id number] April 29, 1941 80 y.o. 04/29/2021   Principle Diagnosis:  Thrombocytopenia -- NASH/Splenomegaly ; Immune based thrombocytopenia Iron deficiency    Current Therapy:        Nplate as indicated  IV iron as indicated  -- Feraheme given on -03/27/2021   Interim History:  [name] is here today for follow-up.  She still feels tired.  We gave her iron back in July.  At that time, her iron saturation was only 11%.  She is having more problems with pain over on her left side.  Again I will know this is from her spleen.  We did do a splenic ultrasound back in May.  She had a splenic volume 872 cm.  This really is not all that big.  I do not know if she has back issues.  She has pain in her left hip area.  She is watch her blood sugars.  Today her blood sugars the best that has been at 140.  She has had no issues with nausea or vomiting.  Her appetite does not do all that well for her.  She has had no issues with bleeding.  There is been no rashes.  She has had no leg swelling.  Overall, I would have to say her performance status is probably ECOG 2.    Medications:  Allergies as of 04/29/2021       Reactions   Codeine Nausea And Vomiting   Metformin And Related Diarrhea   Shrimp [shellfish Allergy] Swelling        Medication List        Accurate as of April 29, 2021 10:54 AM. If you have any questions, ask your nurse or doctor.          acetaminophen 500 MG tablet Commonly known as: TYLENOL Take 500 mg by mouth every 6 (six) hours as needed for mild pain.   albuterol 108 (90 Base) MCG/ACT inhaler Commonly known as: ProAir HFA Inhale 2 puffs into the lungs every 4 (four) hours as needed for wheezing or shortness of breath.   ALPRAZolam 0.5 MG tablet Commonly known as: XANAX TAKE 1 TABLET(0.5 MG) BY MOUTH THREE TIMES DAILY   blood glucose meter kit and supplies Kit Dispense based on patient and  insurance preference. Use up to four times daily as directed.   colestipol 1 g tablet Commonly known as: COLESTID Take 1 g by mouth daily.   cyanocobalamin 1000 MCG tablet Take 1,000 mcg by mouth daily.   dicyclomine 20 MG tablet Commonly known as: BENTYL Take 1 tablet (20 mg total) by mouth 3 (three) times daily as needed.   diphenhydrAMINE 25 MG tablet Commonly known as: BENADRYL Take 25 mg by mouth every 6 (six) hours as needed (for allergies.).   empagliflozin 10 MG Tabs tablet Commonly known as: JARDIANCE Take 1 tablet (10 mg total) by mouth daily.   glipiZIDE 5 MG 24 hr tablet Commonly known as: GLUCOTROL XL Take 1 tablet (5 mg total) by mouth daily with breakfast.   hydrochlorothiazide 25 MG tablet Commonly known as: HYDRODIURIL Take 1 tablet (25 mg total) by mouth daily.   hydrocortisone 2.5 % cream Apply 1 application topically daily as needed (itchy dry skin).   loperamide 2 MG capsule Commonly known as: IMODIUM Take 4-6 mg by mouth daily as needed (for IBS symptoms).   Melatonin 10 MG Tabs Take 10 mg by mouth at bedtime.   omeprazole 40 MG  capsule Commonly known as: PRILOSEC Take 1 capsule (40 mg total) by mouth daily.   OneTouch Delica Lancets Fine Misc 1 each by Other route daily.   OneTouch Ultra test strip Generic drug: glucose blood TEST BLOOD SUGAR(S) ONCE DAILY IN THE MORNING   potassium chloride SA 20 MEQ tablet Commonly known as: [place]-Con M20 Take 1 tablet (20 mEq total) by mouth daily.   sertraline 100 MG tablet Commonly known as: ZOLOFT Take 1 tablet (100 mg total) by mouth daily.   simvastatin 80 MG tablet Commonly known as: ZOCOR TAKE 1 TABLET(80 MG) BY MOUTH DAILY   Stiolto Respimat 2.5-2.5 MCG/ACT Aers Generic drug: Tiotropium Bromide-Olodaterol Inhale 2 puffs into the lungs daily.   traMADol 50 MG tablet Commonly known as: ULTRAM TAKE 1 TABLET(50 MG) BY MOUTH EVERY 6 HOURS AS NEEDED        Allergies:  Allergies   Allergen Reactions   Codeine Nausea And Vomiting   Metformin And Related Diarrhea   Shrimp [Shellfish Allergy] Swelling    Past Medical History, Surgical history, Social history, and Family History were reviewed and updated.  Review of Systems: Review of Systems  Constitutional:  Positive for malaise/fatigue.  HENT: Negative.    Eyes: Negative.   Respiratory: Negative.    Cardiovascular: Negative.   Gastrointestinal:  Positive for abdominal pain.  Genitourinary: Negative.   Musculoskeletal: Negative.   Skin: Negative.   Neurological: Negative.   Endo/Heme/Allergies: Negative.   Psychiatric/Behavioral: Negative.      Physical Exam:  height is 5' 6"  (1.676 m) and weight is 186 lb 0.6 oz (84.4 kg). Her oral temperature is 98.6 F (37 C). Her blood pressure is 131/66 and her pulse is 95. Her respiration is 17 and oxygen saturation is 99%.   Wt Readings from Last 3 Encounters:  04/29/21 186 lb 0.6 oz (84.4 kg)  04/01/21 186 lb (84.4 kg)  03/20/21 189 lb (85.7 kg)    Physical Exam Vitals reviewed.  HENT:     Head: Normocephalic and atraumatic.  Eyes:     Pupils: Pupils are equal, round, and reactive to light.  Cardiovascular:     Rate and Rhythm: Normal rate and regular rhythm.     Heart sounds: Normal heart sounds.  Pulmonary:     Effort: Pulmonary effort is normal.     Breath sounds: Normal breath sounds.  Abdominal:     General: Bowel sounds are normal.     Palpations: Abdomen is soft.  Musculoskeletal:        General: No tenderness or deformity. Normal range of motion.     Cervical back: Normal range of motion.  Lymphadenopathy:     Cervical: No cervical adenopathy.  Skin:    General: Skin is warm and dry.     Findings: No erythema or rash.  Neurological:     Mental Status: She is alert and oriented to person, place, and time.  Psychiatric:        Behavior: Behavior normal.        Thought Content: Thought content normal.        Judgment: Judgment normal.      Lab Results  Component Value Date   WBC 5.0 04/29/2021   HGB 12.1 04/29/2021   HCT 37.0 04/29/2021   MCV 78.1 (L) 04/29/2021   PLT 74 (L) 04/29/2021   Lab Results  Component Value Date   FERRITIN 14 03/20/2021   IRON 48 03/20/2021   TIBC 449 (H) 03/20/2021   UIBC 400 (  H) 03/20/2021   IRONPCTSAT 11 (L) 03/20/2021   Lab Results  Component Value Date   RETICCTPCT 2.1 04/29/2021   RBC 4.74 04/29/2021   RBC 4.80 04/29/2021   No results found for: KPAFRELGTCHN, LAMBDASER, KAPLAMBRATIO No results found for: IGGSERUM, IGA, IGMSERUM No results found for: Odetta Pink, SPEI   Chemistry      Component Value Date/Time   NA 140 03/20/2021 0819   NA 127 (L) 02/14/2017 1335   K 3.5 03/20/2021 0819   K 3.6 02/14/2017 1335   CL 95 (L) 03/20/2021 0819   CL 92 (L) 02/14/2017 1335   CO2 38 (H) 03/20/2021 0819   CO2 28 02/14/2017 1335   BUN 13 03/20/2021 0819   BUN 7 (L) 02/14/2017 1335   CREATININE 0.64 03/20/2021 0819   CREATININE 0.47 (L) 02/14/2017 1335   CREATININE 0.56 07/24/2014 1204      Component Value Date/Time   CALCIUM 9.9 03/20/2021 0819   CALCIUM 9.3 02/14/2017 1335   ALKPHOS 57 03/20/2021 0819   ALKPHOS 68 02/14/2017 1335   AST 18 03/20/2021 0819   ALT 11 03/20/2021 0819   BILITOT 0.9 03/20/2021 0819      Impression and Plan: Ms. 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    Hemoglobin is the best that it has ever been.   The IV iron definitely is helped her.  We certainly do not have to give her any Aranesp.  Her platelet count is also better.  Again she is also have an element of thrombocytopenia because of her NASH and the splenomegaly.  I do think would probably go to have to do another ultrasound of her spleen.  We will see about getting this done to see if there is any change in splenic size.  I am not sure what we can do if her  spleen does increase in size.  I hate the fact that she has all this pain.  Unfortunately, her spleen really cannot be taken out because her spleen is the "release valve" for the liver and her cirrhosis.  I like to have her come back to see [place] in another 6 weeks.   [name], MD 8/31/202210:54 AM

## 2021-04-29 NOTE — Telephone Encounter (Signed)
Per 04/29/21 los - called and gave upcoming appointments - confirmed - mailed calendar

## 2021-04-30 LAB — IRON AND TIBC
Iron: 84 ug/dL (ref 41–142)
Saturation Ratios: 18 % — ABNORMAL LOW (ref 21–57)
TIBC: 464 ug/dL — ABNORMAL HIGH (ref 236–444)
UIBC: 380 ug/dL (ref 120–384)

## 2021-04-30 LAB — FERRITIN: Ferritin: 21 ng/mL (ref 11–307)

## 2021-05-01 ENCOUNTER — Telehealth: Payer: Self-pay

## 2021-05-05 ENCOUNTER — Institutional Professional Consult (permissible substitution): Payer: PPO | Admitting: Neurology

## 2021-05-11 ENCOUNTER — Inpatient Hospital Stay: Payer: PPO | Attending: Family

## 2021-05-11 ENCOUNTER — Ambulatory Visit (HOSPITAL_BASED_OUTPATIENT_CLINIC_OR_DEPARTMENT_OTHER)
Admission: RE | Admit: 2021-05-11 | Discharge: 2021-05-11 | Disposition: A | Discharge status: Home / Self Care | Payer: PPO | Source: Ambulatory Visit | Attending: Hematology & Oncology | Admitting: Hematology & Oncology

## 2021-05-11 ENCOUNTER — Ambulatory Visit (HOSPITAL_BASED_OUTPATIENT_CLINIC_OR_DEPARTMENT_OTHER)
Admission: RE | Admit: 2021-05-11 | Discharge: 2021-05-11 | Disposition: A | Discharge status: Home / Self Care | Payer: PPO | Source: Ambulatory Visit | Attending: Family Medicine | Admitting: Family Medicine

## 2021-05-11 ENCOUNTER — Other Ambulatory Visit: Payer: Self-pay

## 2021-05-11 ENCOUNTER — Encounter (HOSPITAL_BASED_OUTPATIENT_CLINIC_OR_DEPARTMENT_OTHER): Payer: Self-pay

## 2021-05-11 ENCOUNTER — Other Ambulatory Visit: Payer: Self-pay | Admitting: Hematology & Oncology

## 2021-05-11 VITALS — BP 148/68 | HR 70 | Temp 97.7°F | Resp 18

## 2021-05-11 DIAGNOSIS — Z79899 Other long term (current) drug therapy: Secondary | ICD-10-CM | POA: Insufficient documentation

## 2021-05-11 DIAGNOSIS — K7581 Nonalcoholic steatohepatitis (NASH): Secondary | ICD-10-CM

## 2021-05-11 DIAGNOSIS — D696 Thrombocytopenia, unspecified: Secondary | ICD-10-CM | POA: Diagnosis not present

## 2021-05-11 DIAGNOSIS — Z1231 Encounter for screening mammogram for malignant neoplasm of breast: Secondary | ICD-10-CM | POA: Diagnosis not present

## 2021-05-11 DIAGNOSIS — R161 Splenomegaly, not elsewhere classified: Secondary | ICD-10-CM | POA: Diagnosis not present

## 2021-05-11 MED ORDER — SODIUM CHLORIDE 0.9 % IV SOLN: INTRAVENOUS | Status: DC

## 2021-05-11 MED ORDER — SODIUM CHLORIDE 0.9 % IV SOLN
200.0000 mg | Freq: Once | INTRAVENOUS | Status: AC
  Administered 2021-05-11: 200 mg via INTRAVENOUS
  Filled 2021-05-11: qty 200

## 2021-05-11 NOTE — Patient Instructions (Signed)

## 2021-05-13 ENCOUNTER — Telehealth: Payer: Self-pay

## 2021-05-13 NOTE — Telephone Encounter (Signed)
-----   Message from [name], MD sent at 05/12/2021  5:16 PM EDT ----- Call - the spleen is the same size.  Laurey Arrow

## 2021-05-13 NOTE — Telephone Encounter (Signed)
Called and informed patient of lab results, patient verbalized understanding and denies any questions or concerns at this time.   

## 2021-05-20 DIAGNOSIS — E876 Hypokalemia: Secondary | ICD-10-CM | POA: Diagnosis not present

## 2021-05-20 DIAGNOSIS — J189 Pneumonia, unspecified organism: Secondary | ICD-10-CM | POA: Diagnosis not present

## 2021-06-01 ENCOUNTER — Other Ambulatory Visit: Payer: Self-pay | Admitting: Family Medicine

## 2021-06-07 ENCOUNTER — Other Ambulatory Visit: Payer: Self-pay | Admitting: Family Medicine

## 2021-06-08 ENCOUNTER — Inpatient Hospital Stay: Payer: PPO | Attending: Family

## 2021-06-08 ENCOUNTER — Other Ambulatory Visit: Payer: Self-pay

## 2021-06-08 ENCOUNTER — Encounter: Payer: Self-pay | Admitting: Hematology & Oncology

## 2021-06-08 ENCOUNTER — Encounter: Payer: Self-pay | Admitting: *Deleted

## 2021-06-08 ENCOUNTER — Ambulatory Visit (HOSPITAL_BASED_OUTPATIENT_CLINIC_OR_DEPARTMENT_OTHER)
Admission: RE | Admit: 2021-06-08 | Discharge: 2021-06-08 | Disposition: A | Discharge status: Home / Self Care | Payer: PPO | Source: Ambulatory Visit | Attending: Hematology & Oncology | Admitting: Hematology & Oncology

## 2021-06-08 ENCOUNTER — Inpatient Hospital Stay: Payer: PPO | Admitting: Hematology & Oncology

## 2021-06-08 VITALS — BP 158/92 | HR 89 | Temp 98.3°F | Resp 18 | Wt 184.0 lb

## 2021-06-08 DIAGNOSIS — M545 Low back pain, unspecified: Secondary | ICD-10-CM

## 2021-06-08 DIAGNOSIS — K7581 Nonalcoholic steatohepatitis (NASH): Secondary | ICD-10-CM | POA: Insufficient documentation

## 2021-06-08 LAB — CBC WITH DIFFERENTIAL (CANCER CENTER ONLY)
Abs Immature Granulocytes: 0.02 10*3/uL (ref 0.00–0.07)
Basophils Absolute: 0 10*3/uL (ref 0.0–0.1)
Basophils Relative: 1 %
Eosinophils Absolute: 0 10*3/uL (ref 0.0–0.5)
Eosinophils Relative: 1 %
HCT: 38.1 % (ref 36.0–46.0)
Hemoglobin: 12.6 g/dL (ref 12.0–15.0)
Immature Granulocytes: 0 %
Lymphocytes Relative: 16 %
Lymphs Abs: 0.9 10*3/uL (ref 0.7–4.0)
MCH: 26.4 pg (ref 26.0–34.0)
MCHC: 33.1 g/dL (ref 30.0–36.0)
MCV: 79.7 fL — ABNORMAL LOW (ref 80.0–100.0)
Monocytes Absolute: 0.5 10*3/uL (ref 0.1–1.0)
Monocytes Relative: 9 %
Neutro Abs: 4.3 10*3/uL (ref 1.7–7.7)
Neutrophils Relative %: 73 %
Platelet Count: 64 10*3/uL — ABNORMAL LOW (ref 150–400)
RBC: 4.78 MIL/uL (ref 3.87–5.11)
RDW: 18.2 % — ABNORMAL HIGH (ref 11.5–15.5)
WBC Count: 5.8 10*3/uL (ref 4.0–10.5)
nRBC: 0 % (ref 0.0–0.2)

## 2021-06-08 LAB — CMP (CANCER CENTER ONLY)
ALT: 10 U/L (ref 0–44)
AST: 16 U/L (ref 15–41)
Albumin: 4.6 g/dL (ref 3.5–5.0)
Alkaline Phosphatase: 50 U/L (ref 38–126)
Anion gap: 8 (ref 5–15)
BUN: 15 mg/dL (ref 8–23)
CO2: 36 mmol/L — ABNORMAL HIGH (ref 22–32)
Calcium: 10.3 mg/dL (ref 8.9–10.3)
Chloride: 95 mmol/L — ABNORMAL LOW (ref 98–111)
Creatinine: 0.65 mg/dL (ref 0.44–1.00)
GFR, Estimated: >60 mL/min (ref >60)
Glucose, Bld: 160 mg/dL — ABNORMAL HIGH (ref 70–99)
Potassium: 3.5 mmol/L (ref 3.5–5.1)
Sodium: 139 mmol/L (ref 135–145)
Total Bilirubin: 1.1 mg/dL (ref 0.3–1.2)
Total Protein: 7.3 g/dL (ref 6.5–8.1)

## 2021-06-08 LAB — RETICULOCYTES
Immature Retic Fract: 15.8 % (ref 2.3–15.9)
RBC.: 4.79 MIL/uL (ref 3.87–5.11)
Retic Count, Absolute: 115.4 10*3/uL (ref 19.0–186.0)
Retic Ct Pct: 2.4 % (ref 0.4–3.1)

## 2021-06-08 LAB — IRON AND TIBC
Iron: 83 ug/dL (ref 41–142)
Saturation Ratios: 19 % — ABNORMAL LOW (ref 21–57)
TIBC: 435 ug/dL (ref 236–444)
UIBC: 352 ug/dL (ref 120–384)

## 2021-06-08 LAB — FERRITIN: Ferritin: 23 ng/mL (ref 11–307)

## 2021-06-08 LAB — LACTATE DEHYDROGENASE: LDH: 167 U/L (ref 98–192)

## 2021-06-08 MED ORDER — TRAMADOL HCL 50 MG PO TABS: ORAL_TABLET | ORAL | 0 refills | Status: DC

## 2021-06-08 NOTE — Progress Notes (Signed)
Hematology and Oncology Follow Up Visit  Rachael Buck [id number] 02/05/1941 79 y.o. 06/08/2021   Principle Diagnosis:  Thrombocytopenia -- NASH/Splenomegaly ; Immune based thrombocytopenia Iron deficiency    Current Therapy:        Nplate as indicated  IV iron as indicated  -- Feraheme given on -05/11/2021   Interim History:  Rachael Buck is here today for follow-up.  She comes in with her sister.  She is doing okay.  She is having more lower back pain.  I told her that this is anything related to the splenomegaly.  We will get plain films of her back to see what that looks like.  She is worried about her memory.  Again, this is nothing to do with her splenomegaly.  She can take over-the-counter remedies if she would like.  We did do an ultrasound of her spleen.  This is back in September.  The spleen has increased in size little bit.  The volume is down 963 cm.  Prior to this, it was 872 cm.  Again I think this is all related to her cirrhosis.  We did going give her iron back in September.  At that time, her iron saturation was only 18%.  She has had no change in bowel or bladder habits.  She is trying to watch her diabetes.  She has had no rashes.  There has been no bleeding.  She has had no cough or shortness of breath.  She has had no headache.  Overall, I would have to say that her performance status is probably ECOG 2.    Medications:  Allergies as of 06/08/2021       Reactions   Codeine Nausea And Vomiting   Metformin And Related Diarrhea   Shrimp [shellfish Allergy] Swelling        Medication List        Accurate as of June 08, 2021 10:33 AM. If you have any questions, ask your nurse or doctor.          acetaminophen 500 MG tablet Commonly known as: TYLENOL Take 500 mg by mouth every 6 (six) hours as needed for mild pain.   albuterol 108 (90 Base) MCG/ACT inhaler Commonly known as: ProAir HFA Inhale 2 puffs into the lungs every 4 (four) hours  as needed for wheezing or shortness of breath.   ALPRAZolam 0.5 MG tablet Commonly known as: XANAX TAKE 1 TABLET(0.5 MG) BY MOUTH THREE TIMES DAILY   blood glucose meter kit and supplies Kit Dispense based on patient and insurance preference. Use up to four times daily as directed.   colestipol 1 g tablet Commonly known as: COLESTID Take 1 g by mouth daily.   cyanocobalamin 1000 MCG tablet Take 1,000 mcg by mouth daily.   dicyclomine 20 MG tablet Commonly known as: BENTYL Take 1 tablet (20 mg total) by mouth 3 (three) times daily as needed.   diphenhydrAMINE 25 MG tablet Commonly known as: BENADRYL Take 25 mg by mouth every 6 (six) hours as needed (for allergies.).   empagliflozin 10 MG Tabs tablet Commonly known as: JARDIANCE Take 1 tablet (10 mg total) by mouth daily.   glipiZIDE 5 MG 24 hr tablet Commonly known as: GLUCOTROL XL Take 1 tablet (5 mg total) by mouth daily with breakfast.   hydrochlorothiazide 25 MG tablet Commonly known as: HYDRODIURIL Take 1 tablet (25 mg total) by mouth daily.   hydrocortisone 2.5 % cream Apply 1 application topically daily as needed (itchy dry   skin).   loperamide 2 MG capsule Commonly known as: IMODIUM Take 4-6 mg by mouth daily as needed (for IBS symptoms).   Melatonin 10 MG Tabs Take 10 mg by mouth at bedtime.   omeprazole 40 MG capsule Commonly known as: PRILOSEC Take 1 capsule (40 mg total) by mouth daily.   OneTouch Delica Lancets Fine Misc 1 each by Other route daily.   OneTouch Ultra test strip Generic drug: glucose blood TEST BLOOD SUGAR(S) ONCE DAILY IN THE MORNING   potassium chloride SA 20 MEQ tablet Commonly known as: Klor-Con M20 Take 1 tablet (20 mEq total) by mouth daily.   sertraline 100 MG tablet Commonly known as: ZOLOFT Take 1 tablet (100 mg total) by mouth daily.   simvastatin 80 MG tablet Commonly known as: ZOCOR TAKE 1 TABLET(80 MG) BY MOUTH DAILY   Stiolto Respimat 2.5-2.5 MCG/ACT  Aers Generic drug: Tiotropium Bromide-Olodaterol Inhale 2 puffs into the lungs daily.   traMADol 50 MG tablet Commonly known as: ULTRAM TAKE 1 TABLET(50 MG) BY MOUTH EVERY 6 HOURS AS NEEDED        Allergies:  Allergies  Allergen Reactions   Codeine Nausea And Vomiting   Metformin And Related Diarrhea   Shrimp [Shellfish Allergy] Swelling    Past Medical History, Surgical history, Social history, and Family History were reviewed and updated.  Review of Systems: Review of Systems  Constitutional:  Positive for malaise/fatigue.  HENT: Negative.    Eyes: Negative.   Respiratory: Negative.    Cardiovascular: Negative.   Gastrointestinal:  Positive for abdominal pain.  Genitourinary: Negative.   Musculoskeletal: Negative.   Skin: Negative.   Neurological: Negative.   Endo/Heme/Allergies: Negative.   Psychiatric/Behavioral: Negative.      Physical Exam:  weight is 184 lb (83.5 kg). Her oral temperature is 98.3 F (36.8 C). Her blood pressure is 158/92 (abnormal) and her pulse is 89. Her respiration is 18 and oxygen saturation is 95%.   Wt Readings from Last 3 Encounters:  06/08/21 184 lb (83.5 kg)  04/29/21 186 lb 0.6 oz (84.4 kg)  04/01/21 186 lb (84.4 kg)    Physical Exam Vitals reviewed.  HENT:     Head: Normocephalic and atraumatic.  Eyes:     Pupils: Pupils are equal, round, and reactive to light.  Cardiovascular:     Rate and Rhythm: Normal rate and regular rhythm.     Heart sounds: Normal heart sounds.  Pulmonary:     Effort: Pulmonary effort is normal.     Breath sounds: Normal breath sounds.  Abdominal:     General: Bowel sounds are normal.     Palpations: Abdomen is soft.  Musculoskeletal:        General: No tenderness or deformity. Normal range of motion.     Cervical back: Normal range of motion.  Lymphadenopathy:     Cervical: No cervical adenopathy.  Skin:    General: Skin is warm and dry.     Findings: No erythema or rash.  Neurological:      Mental Status: She is alert and oriented to person, place, and time.  Psychiatric:        Behavior: Behavior normal.        Thought Content: Thought content normal.        Judgment: Judgment normal.     Lab Results  Component Value Date   WBC 5.8 06/08/2021   HGB 12.6 06/08/2021   HCT 38.1 06/08/2021   MCV 79.7 (L) 06/08/2021     PLT 64 (L) 06/08/2021   Lab Results  Component Value Date   FERRITIN 21 04/29/2021   IRON 84 04/29/2021   TIBC 464 (H) 04/29/2021   UIBC 380 04/29/2021   IRONPCTSAT 18 (L) 04/29/2021   Lab Results  Component Value Date   RETICCTPCT 2.4 06/08/2021   RBC 4.79 06/08/2021   No results found for: KPAFRELGTCHN, LAMBDASER, KAPLAMBRATIO No results found for: IGGSERUM, IGA, IGMSERUM No results found for: TOTALPROTELP, ALBUMINELP, A1GS, A2GS, BETS, BETA2SER, GAMS, MSPIKE, SPEI   Chemistry      Component Value Date/Time   NA 139 06/08/2021 0900   NA 127 (L) 02/14/2017 1335   K 3.5 06/08/2021 0900   K 3.6 02/14/2017 1335   CL 95 (L) 06/08/2021 0900   CL 92 (L) 02/14/2017 1335   CO2 36 (H) 06/08/2021 0900   CO2 28 02/14/2017 1335   BUN 15 06/08/2021 0900   BUN 7 (L) 02/14/2017 1335   CREATININE 0.65 06/08/2021 0900   CREATININE 0.47 (L) 02/14/2017 1335   CREATININE 0.56 07/24/2014 1204      Component Value Date/Time   CALCIUM 10.3 06/08/2021 0900   CALCIUM 9.3 02/14/2017 1335   ALKPHOS 50 06/08/2021 0900   ALKPHOS 68 02/14/2017 1335   AST 16 06/08/2021 0900   ALT 10 06/08/2021 0900   BILITOT 1.1 06/08/2021 0900      Impression and Plan: Ms. 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    The iron deficit is helped her hemoglobin.  She still has a deal with the NASH.  Again her spleen is a little bit larger.  Her platelet count is holding steady.  We will see what the lumbar spine films show.  I told her that this is some that her family doctor could easily take care of.   We can certainly get the process rolling.  We will see what her iron levels look like also.  I would like to see her back before Thanksgiving.  Hopefully, we can then get her through all of the holidays.    R , MD 10/10/202210:33 AM 

## 2021-06-09 ENCOUNTER — Telehealth: Payer: Self-pay

## 2021-06-09 NOTE — Telephone Encounter (Signed)
Called and informed patient of scan results, patient verbalized understanding and denies any questions or concerns at this time.

## 2021-06-09 NOTE — Telephone Encounter (Signed)
-----   Message from [name], MD sent at 06/09/2021  2:04 PM EDT ----- Please call him and tell that there is advanced arthritis in her lower back.  This is no surprise.  I am not sure what can really be done about this.  She probably needs to talk to her family doctor about this.

## 2021-06-12 ENCOUNTER — Inpatient Hospital Stay: Payer: PPO

## 2021-06-12 ENCOUNTER — Other Ambulatory Visit: Payer: Self-pay

## 2021-06-12 VITALS — BP 163/72 | HR 75 | Temp 98.3°F | Resp 17

## 2021-06-12 DIAGNOSIS — K7581 Nonalcoholic steatohepatitis (NASH): Secondary | ICD-10-CM | POA: Diagnosis not present

## 2021-06-12 DIAGNOSIS — Z79899 Other long term (current) drug therapy: Secondary | ICD-10-CM | POA: Diagnosis not present

## 2021-06-12 DIAGNOSIS — D696 Thrombocytopenia, unspecified: Secondary | ICD-10-CM | POA: Insufficient documentation

## 2021-06-12 DIAGNOSIS — R161 Splenomegaly, not elsewhere classified: Secondary | ICD-10-CM | POA: Insufficient documentation

## 2021-06-12 DIAGNOSIS — D509 Iron deficiency anemia, unspecified: Secondary | ICD-10-CM | POA: Insufficient documentation

## 2021-06-12 DIAGNOSIS — K7460 Unspecified cirrhosis of liver: Secondary | ICD-10-CM | POA: Insufficient documentation

## 2021-06-12 MED ORDER — SODIUM CHLORIDE 0.9 % IV SOLN: INTRAVENOUS | Status: DC

## 2021-06-12 MED ORDER — SODIUM CHLORIDE 0.9 % IV SOLN
200.0000 mg | Freq: Once | INTRAVENOUS | Status: AC
  Administered 2021-06-12: 200 mg via INTRAVENOUS
  Filled 2021-06-12: qty 200

## 2021-06-12 NOTE — Progress Notes (Signed)
[place] at [facility] [address], [place], [place] [address] [phone]  Date:  06/17/2021   Name:  Rachael Buck   DOB:  06/19/1941   MRN:  [id number]  PCP:  [name], MD    Chief Complaint: pain in joint and Fatigue   History of Present Illness:  Rachael Buck is a 80 y.o. very pleasant female patient who presents with the following:  Pt here today for follow-up - concern of arthritis or some other cause of pain.  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    Shingirx Tetanus Colon cancer screening Covid booster- recommended, she plans to do this  Eye exam - she is overdue, she will do this  Flu shot today   Today [name] is somewhat vague about her history.  I am having a difficult time determining where she is hurting and for how long She notes worsening lower back pain- she had a lumbar film series on 10/10 LUMBAR SPINE - COMPLETE 4+ VIEW COMPARISON:  07/10/2020 abdominopelvic CT.   FINDINGS: Five lumbar type vertebral bodies. Cholecystectomy. Left sacroiliac joint sclerosis is likely degenerative. Advanced abdominal aortic atherosclerosis. Maintenance of vertebral body height. Trace L4-5 anterolisthesis is unchanged. Loss of intervertebral disc height is most significant at L3-4 and L5-S1. Facet arthropathy at multiple levels, including L3 through S1. IMPRESSION: Advanced lumbosacral spondylosis, without acute superimposed process. Trace L4-5 anterolisthesis, felt to be similar. Aortic Atherosclerosis (ICD10-I70.0).  She has tried some tramadol but it does not seem to help that much She also has tried some tylenol  She has noted worsening of her pain the last 2-3 weeks  She states the pain is in  her back, then in her hips, then her abdomen  Her husband Rachael Buck continues to have failing health -apparently he was vomiting significantly yesterday and today.  She asked me to call him in medication for vomiting.  I advised that he should be seen, recommended taking him to the emergency room.  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   Thrombocytopenia ([place]) 02/15/2017   Varicose veins of left lower extremity with complications 36/46/8032   Age-related cognitive decline 06/02/2015   Fatigue 07/19/2014   Orthostatic hypotension 05/14/2013   Nail abnormality 09/04/2012   Bleeding disorder ([place]) 05/08/2012   Varices, 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   NASH (nonalcoholic steatohepatitis) 03/21/2012   HTN (hypertension) 12/21/2011   Depression 03/28/2011   IBS (irritable bowel syndrome) 03/23/2011   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[place]) 08/27/2010   DYSPNEA ON EXERTION 07/27/2010   Uncontrolled diabetes mellitus 07/24/2010   MIXED HYPERLIPIDEMIA 07/24/2010   Obesity, unspecified 07/24/2010   ALLERGIC RHINITIS CAUSE UNSPECIFIED 07/24/2010   REFLUX ESOPHAGITIS 07/24/2010   PAIN IN JOINT PELVIC REGION AND THIGH 07/24/2010    Past Medical History:  Diagnosis Date   Anxiety  Arthritis    Asthma    Cirrhosis, nonalcoholic ([place])    Colon polyps    Diabetes mellitus    Diverticulitis    GERD (gastroesophageal reflux disease)    Hyperlipidemia    Hypertension    NASH (nonalcoholic steatohepatitis)    Obesity    Pneumonia     Past Surgical History:  Procedure Laterality  Date   CHOLECYSTECTOMY  1981   ENDOVENOUS ABLATION SAPHENOUS VEIN W/ LASER Left 02/21/2018   endovenous laser ablation left greater saphenous vein by Tinnie Gens MD    [place]     bilateral. 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   Procedure: LEFT TOTAL KNEE ARTHROPLASTY;  Surgeon: Netta Cedars, MD;  Location: [facility];  Service: Orthopedics;  Laterality: Left;   VEIN SURGERY      Social History   Tobacco Use   Smoking status: Former    Packs/day: 1.50    Years: 15.00    Pack years: 22.50    Types: Cigarettes   Smokeless tobacco: Never   Tobacco comments:    Quit 2002.  Vaping Use   Vaping Use: Never used  Substance Use Topics   Alcohol use: No   Drug use: No    Family History  Problem Relation Age of Onset   Stomach cancer Mother    Diabetes Sister    Breast cancer Daughter 37   Breast cancer Maternal Aunt    Diabetes Brother    Cancer Other    GER disease Other    Obesity Other     Allergies  Allergen Reactions   Codeine Nausea And Vomiting   Metformin And Related Diarrhea   Shrimp [Shellfish Allergy] Swelling    Medication list has been reviewed and updated.  Current Outpatient Medications on File Prior to Visit  Medication Sig Dispense Refill   acetaminophen (TYLENOL) 500 MG tablet Take 500 mg by mouth every 6 (six) hours as needed for mild pain.     albuterol (PROAIR HFA) 108 (90 Base) MCG/ACT inhaler Inhale 2 puffs into the lungs every 4 (four) hours as needed for wheezing or shortness of breath. 1 Inhaler 6   ALPRAZolam (XANAX) 0.5 MG tablet TAKE 1 TABLET(0.5 MG) BY MOUTH THREE TIMES DAILY 90 tablet 3   blood glucose meter kit and supplies KIT Dispense based on patient and insurance preference. Use up to four times daily as directed. 1 each 0   colestipol (COLESTID) 1 g tablet Take 1 g by mouth daily.     cyanocobalamin 1000 MCG tablet  Take 1,000 mcg by mouth daily.     dicyclomine (BENTYL) 20 MG tablet Take 1 tablet (20 mg total) by mouth 3 (three) times daily as needed. 360 tablet 0   diphenhydrAMINE (BENADRYL) 25 MG tablet Take 25 mg by mouth every 6 (six) hours as needed (for allergies.).     empagliflozin (JARDIANCE) 10 MG TABS tablet Take 1 tablet (10 mg total) by mouth daily. 90 tablet 3   glipiZIDE (GLUCOTROL XL) 5 MG 24 hr tablet Take 1 tablet (5 mg total) by mouth daily with breakfast. 90 tablet 3   hydrochlorothiazide (HYDRODIURIL) 25 MG tablet TAKE 1 TABLET(25 MG) BY MOUTH DAILY 90 tablet 1   hydrocortisone 2.5 % cream Apply 1 application topically daily as needed (itchy dry skin).     loperamide (IMODIUM) 2 MG capsule Take  4-6 mg by mouth daily as needed (for IBS symptoms).     Melatonin 10 MG TABS Take 10 mg by mouth at bedtime.     omeprazole (PRILOSEC) 40 MG capsule Take 1 capsule (40 mg total) by mouth daily. 90 capsule 1   ONETOUCH DELICA LANCETS FINE MISC 1 each by Other route daily. 100 each 12   ONETOUCH ULTRA test strip TEST BLOOD SUGAR(S) ONCE DAILY IN THE MORNING 100 strip 12   potassium chloride SA (KLOR-CON M20) 20 MEQ tablet Take 1 tablet (20 mEq total) by mouth daily. 90 tablet 3   sertraline (ZOLOFT) 100 MG tablet Take 1 tablet (100 mg total) by mouth daily. 90 tablet 3   simvastatin (ZOCOR) 80 MG tablet TAKE 1 TABLET(80 MG) BY MOUTH DAILY 90 tablet 1   Tiotropium Bromide-Olodaterol (STIOLTO RESPIMAT) 2.5-2.5 MCG/ACT AERS Inhale 2 puffs into the lungs daily. 4 g 0   traMADol (ULTRAM) 50 MG tablet TAKE 1 TABLET(50 MG) BY MOUTH EVERY 6 HOURS AS NEEDED 60 tablet 0   No current facility-administered medications on file prior to visit.    Review of Systems:  As per HPI- otherwise negative.   Physical Examination: Vitals:   06/17/21 1502  BP: (!) 176/96  Pulse: 99  Resp: 16  Temp: 98.6 F (37 C)  SpO2: 93%   Vitals:   06/17/21 1502  Weight: 181 lb 12.8 oz (82.5 kg)   Body mass index is  29.34 kg/m. Ideal Body Weight:    GEN: no acute distress.  Obese, looks well HEENT: Atraumatic, Normocephalic.  Ears and Nose: No external deformity. CV: RRR, No M/G/R. No JVD. No thrill. No extra heart sounds. PULM: CTA B, no wheezes, crackles, rhonchi. No retractions. No resp. distress. No accessory muscle use. ABD: S, ND, +BS. No rebound.  Chronic splenomegaly is present.  Patient endorses mild, vague tenderness over the entire abdomen.  She states this is not new EXTR: No c/c/e PSYCH: Normally interactive. Conversant.  Thoracolumbar flexion is reduced.  No lumbar bony tenderness   Assessment and Plan: Controlled type 2 diabetes mellitus without complication, without long-term current use of insulin (HCC)  Essential hypertension - Plan: lisinopril (ZESTRIL) 20 MG tablet  Mixed hyperlipidemia  Lumbar arthropathy - Plan: Ambulatory referral to Orthopedic Surgery  Need for influenza vaccination - Plan: Flu Vaccine QUAD High Dose(Fluad)  Patient is seen today with somewhat vague pain, she states it is from her "hips" but then states it is actually her abdomen.  She also recently had lumbar films which show significant degenerative change  I referred her to see Dr. Nelva Bush at [facility] for back and hip pain  Discussed her abdominal pain, offered to arrange a CT of her abdomen pelvis.  She declines at this time, states she is following up this issue with Dr. Marin Olp in a couple of weeks I advised her if her pain is changing or getting worse please let me know otherwise seek care  Her blood pressure is high, she is currently taking HCTZ 25 alone.  We will add lisinopril 20 mg  BP Readings from Last 3 Encounters:  06/17/21 (!) 176/96  06/12/21 (!) 163/72  06/08/21 (!) 158/92   She is having difficulty paying for Jardiance for her diabetes.  I provided her patient assistance form, she will complete it and return for me to submit  Signed Lamar Blinks, MD

## 2021-06-12 NOTE — Patient Instructions (Signed)

## 2021-06-17 ENCOUNTER — Ambulatory Visit (INDEPENDENT_AMBULATORY_CARE_PROVIDER_SITE_OTHER): Payer: PPO | Admitting: Family Medicine

## 2021-06-17 ENCOUNTER — Encounter: Payer: Self-pay | Admitting: Family Medicine

## 2021-06-17 ENCOUNTER — Other Ambulatory Visit: Payer: Self-pay

## 2021-06-17 VITALS — BP 176/96 | HR 99 | Temp 98.6°F | Resp 16 | Wt 181.8 lb

## 2021-06-17 DIAGNOSIS — M47816 Spondylosis without myelopathy or radiculopathy, lumbar region: Secondary | ICD-10-CM

## 2021-06-17 DIAGNOSIS — E119 Type 2 diabetes mellitus without complications: Secondary | ICD-10-CM | POA: Diagnosis not present

## 2021-06-17 DIAGNOSIS — I10 Essential (primary) hypertension: Secondary | ICD-10-CM | POA: Diagnosis not present

## 2021-06-17 DIAGNOSIS — E782 Mixed hyperlipidemia: Secondary | ICD-10-CM | POA: Diagnosis not present

## 2021-06-17 DIAGNOSIS — Z23 Encounter for immunization: Secondary | ICD-10-CM

## 2021-06-17 MED ORDER — LISINOPRIL 20 MG PO TABS: 20.0000 mg | ORAL_TABLET | Freq: Every day | ORAL | 3 refills | Status: DC

## 2021-06-17 NOTE — Patient Instructions (Signed)
Good to see you today!  I will refer you to see Dr Nelva Bush at [facility] for your back and hips  Flu shot given today Ok to get your covid booster at your convenience Your BP is too high- let's add lisinopril 20 mg to your regimen

## 2021-07-08 DIAGNOSIS — M5416 Radiculopathy, lumbar region: Secondary | ICD-10-CM | POA: Diagnosis not present

## 2021-07-10 IMAGING — CT CT BIOPSY
1 series · 1 of 1 positions shown · non-contrast
Comparison: none

INDICATION: 79-year-old female with splenomegaly and thrombocytopenia.

[Series 1: topogram 1.0 tr20 · coronal · 1.00mm/px · 1 of 1 slices shown]
[im 1/1]
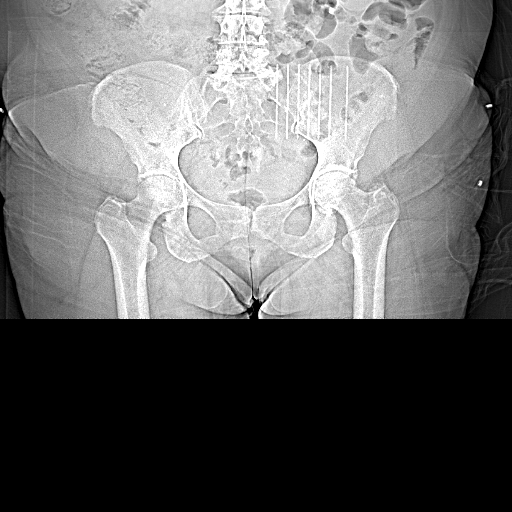

[1 of 1 positions shown; findings below may reference images not displayed]

EXAM:
CT-GUIDED BONE MARROW BIOPSY AND ASPIRATION

MEDICATIONS:
None

ANESTHESIA/SEDATION:
Fentanyl 100 mcg IV; Versed 4 mg IV

Sedation Time: 10 minutes; The patient was continuously monitored
during the procedure by the interventional radiology nurse under my
direct supervision.

COMPLICATIONS:
None immediate.

PROCEDURE:
Informed consent was obtained from the patient following an
explanation of the procedure, risks, benefits and alternatives. The
patient understands, agrees and consents for the procedure. All
questions were addressed. A time out was performed prior to the
initiation of the procedure.

The patient was positioned prone and non-contrast localization CT
was performed of the pelvis to demonstrate the iliac marrow spaces.
The operative site was prepped and draped in the usual sterile
fashion.

Under sterile conditions and local anesthesia, a 22 gauge spinal
needle was utilized for procedural planning. Next, an 11 gauge
coaxial bone biopsy needle was advanced into the right iliac marrow
space. Needle position was confirmed with CT imaging. Initially, a
bone marrow aspiration was performed. Next, a bone marrow biopsy was
obtained with the 11 gauge outer bone marrow device. Samples were
prepared with the cytotechnologist and deemed adequate. The needle
was removed and superficial hemostasis was obtained with manual
compression. A dressing was applied. The patient tolerated the
procedure well without immediate post procedural complication.
IMPRESSION: Successful CT guided right iliac bone marrow aspiration and core
biopsy.

## 2021-07-10 IMAGING — CT CT BIOPSY AND ASPIRATION BONE MARROW
1 series · 3 of 3 positions shown, 4 images · non-contrast
Comparison: none

INDICATION: 79-year-old female with splenomegaly and thrombocytopenia.

[Series 1: i-sequence 4.8 br43 · axial · 0.87mm/px · z∈[+1173,+1183]mm · 3 of 3 slices shown, 4 images]
[im 1/3  mediastinal]
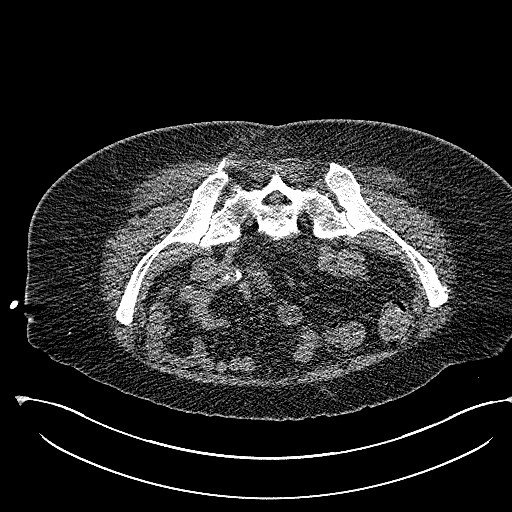
[im 1/3  lung]
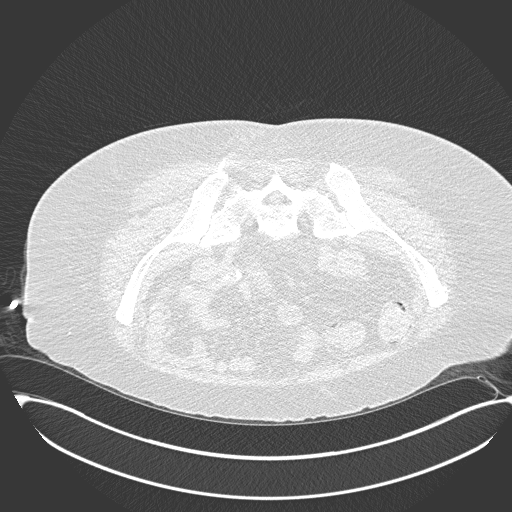
[im 2/3  lung]
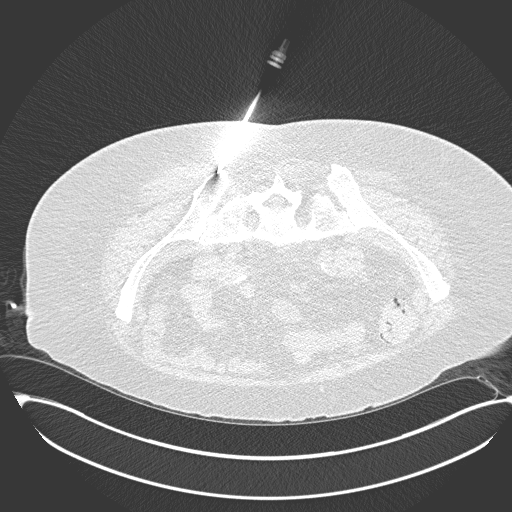
[im 3/3  lung]
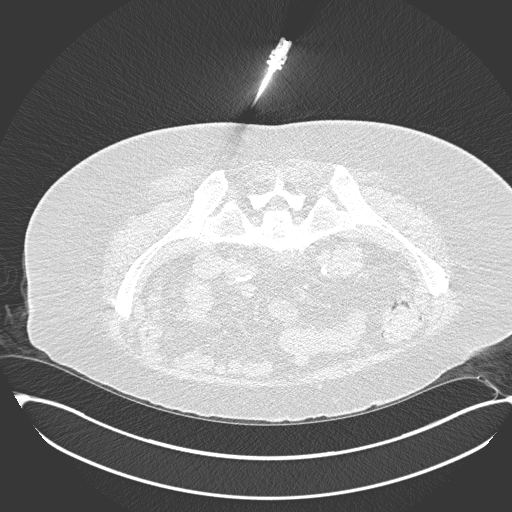

[3 of 3 positions shown; findings below may reference images not displayed]

EXAM:
CT-GUIDED BONE MARROW BIOPSY AND ASPIRATION

MEDICATIONS:
None

ANESTHESIA/SEDATION:
Fentanyl 100 mcg IV; Versed 4 mg IV

Sedation Time: 10 minutes; The patient was continuously monitored
during the procedure by the interventional radiology nurse under my
direct supervision.

COMPLICATIONS:
None immediate.

PROCEDURE:
Informed consent was obtained from the patient following an
explanation of the procedure, risks, benefits and alternatives. The
patient understands, agrees and consents for the procedure. All
questions were addressed. A time out was performed prior to the
initiation of the procedure.

The patient was positioned prone and non-contrast localization CT
was performed of the pelvis to demonstrate the iliac marrow spaces.
The operative site was prepped and draped in the usual sterile
fashion.

Under sterile conditions and local anesthesia, a 22 gauge spinal
needle was utilized for procedural planning. Next, an 11 gauge
coaxial bone biopsy needle was advanced into the right iliac marrow
space. Needle position was confirmed with CT imaging. Initially, a
bone marrow aspiration was performed. Next, a bone marrow biopsy was
obtained with the 11 gauge outer bone marrow device. Samples were
prepared with the cytotechnologist and deemed adequate. The needle
was removed and superficial hemostasis was obtained with manual
compression. A dressing was applied. The patient tolerated the
procedure well without immediate post procedural complication.
IMPRESSION: Successful CT guided right iliac bone marrow aspiration and core
biopsy.

## 2021-07-18 ENCOUNTER — Other Ambulatory Visit: Payer: Self-pay | Admitting: Family Medicine

## 2021-07-18 ENCOUNTER — Other Ambulatory Visit: Payer: Self-pay | Admitting: Hematology & Oncology

## 2021-07-18 DIAGNOSIS — F32A Depression, unspecified: Secondary | ICD-10-CM

## 2021-07-20 ENCOUNTER — Encounter: Payer: Self-pay | Admitting: Hematology & Oncology

## 2021-07-20 ENCOUNTER — Other Ambulatory Visit: Payer: Self-pay | Admitting: *Deleted

## 2021-07-20 DIAGNOSIS — M25559 Pain in unspecified hip: Secondary | ICD-10-CM

## 2021-07-20 DIAGNOSIS — M545 Low back pain, unspecified: Secondary | ICD-10-CM

## 2021-07-20 MED ORDER — TRAMADOL HCL 50 MG PO TABS: ORAL_TABLET | ORAL | 0 refills | Status: DC

## 2021-07-21 ENCOUNTER — Inpatient Hospital Stay: Payer: PPO

## 2021-07-21 ENCOUNTER — Inpatient Hospital Stay: Payer: PPO | Attending: Family | Admitting: Hematology & Oncology

## 2021-07-21 ENCOUNTER — Other Ambulatory Visit: Payer: Self-pay

## 2021-07-21 ENCOUNTER — Encounter: Payer: Self-pay | Admitting: Hematology & Oncology

## 2021-07-21 VITALS — BP 116/58 | HR 85 | Temp 98.9°F | Resp 20 | Wt 183.0 lb

## 2021-07-21 DIAGNOSIS — Z79899 Other long term (current) drug therapy: Secondary | ICD-10-CM | POA: Diagnosis not present

## 2021-07-21 DIAGNOSIS — D649 Anemia, unspecified: Secondary | ICD-10-CM | POA: Diagnosis not present

## 2021-07-21 DIAGNOSIS — R161 Splenomegaly, not elsewhere classified: Secondary | ICD-10-CM | POA: Diagnosis not present

## 2021-07-21 DIAGNOSIS — D699 Hemorrhagic condition, unspecified: Secondary | ICD-10-CM

## 2021-07-21 DIAGNOSIS — D696 Thrombocytopenia, unspecified: Secondary | ICD-10-CM | POA: Diagnosis not present

## 2021-07-21 DIAGNOSIS — K7581 Nonalcoholic steatohepatitis (NASH): Secondary | ICD-10-CM | POA: Insufficient documentation

## 2021-07-21 DIAGNOSIS — M545 Low back pain, unspecified: Secondary | ICD-10-CM

## 2021-07-21 LAB — CBC WITH DIFFERENTIAL (CANCER CENTER ONLY)
Abs Immature Granulocytes: 0.04 10*3/uL (ref 0.00–0.07)
Basophils Absolute: 0 10*3/uL (ref 0.0–0.1)
Basophils Relative: 1 %
Eosinophils Absolute: 0 10*3/uL (ref 0.0–0.5)
Eosinophils Relative: 1 %
HCT: 37.5 % (ref 36.0–46.0)
Hemoglobin: 12.3 g/dL (ref 12.0–15.0)
Immature Granulocytes: 1 %
Lymphocytes Relative: 19 %
Lymphs Abs: 1 10*3/uL (ref 0.7–4.0)
MCH: 27.5 pg (ref 26.0–34.0)
MCHC: 32.8 g/dL (ref 30.0–36.0)
MCV: 83.7 fL (ref 80.0–100.0)
Monocytes Absolute: 0.4 10*3/uL (ref 0.1–1.0)
Monocytes Relative: 7 %
Neutro Abs: 3.9 10*3/uL (ref 1.7–7.7)
Neutrophils Relative %: 71 %
Platelet Count: 61 10*3/uL — ABNORMAL LOW (ref 150–400)
RBC: 4.48 MIL/uL (ref 3.87–5.11)
RDW: 17.2 % — ABNORMAL HIGH (ref 11.5–15.5)
WBC Count: 5.4 10*3/uL (ref 4.0–10.5)
nRBC: 0 % (ref 0.0–0.2)

## 2021-07-21 LAB — CMP (CANCER CENTER ONLY)
ALT: 10 U/L (ref 0–44)
AST: 16 U/L (ref 15–41)
Albumin: 4.5 g/dL (ref 3.5–5.0)
Alkaline Phosphatase: 48 U/L (ref 38–126)
Anion gap: 9 (ref 5–15)
BUN: 23 mg/dL (ref 8–23)
CO2: 34 mmol/L — ABNORMAL HIGH (ref 22–32)
Calcium: 9.7 mg/dL (ref 8.9–10.3)
Chloride: 95 mmol/L — ABNORMAL LOW (ref 98–111)
Creatinine: 0.93 mg/dL (ref 0.44–1.00)
GFR, Estimated: >60 mL/min (ref >60)
Glucose, Bld: 246 mg/dL — ABNORMAL HIGH (ref 70–99)
Potassium: 3.3 mmol/L — ABNORMAL LOW (ref 3.5–5.1)
Sodium: 138 mmol/L (ref 135–145)
Total Bilirubin: 0.9 mg/dL (ref 0.3–1.2)
Total Protein: 7.4 g/dL (ref 6.5–8.1)

## 2021-07-21 LAB — IRON AND TIBC
Iron: 99 ug/dL (ref 41–142)
Saturation Ratios: 25 % (ref 21–57)
TIBC: 393 ug/dL (ref 236–444)
UIBC: 294 ug/dL (ref 120–384)

## 2021-07-21 LAB — FERRITIN: Ferritin: 40 ng/mL (ref 11–307)

## 2021-07-21 LAB — SAVE SMEAR(SSMR), FOR PROVIDER SLIDE REVIEW

## 2021-07-21 LAB — LACTATE DEHYDROGENASE: LDH: 123 U/L (ref 98–192)

## 2021-07-21 NOTE — Progress Notes (Signed)
Hematology and Oncology Follow Up Visit  Rachael Buck [id number] 04-17-1941 80 y.o. 07/21/2021   Principle Diagnosis:  Thrombocytopenia -- NASH/Splenomegaly ; Immune based thrombocytopenia Iron deficiency    Current Therapy:        Nplate as indicated  IV iron as indicated  -- Feraheme given on -06/12/2021   Interim History:  Rachael Buck is here today for follow-up.  She actually came in with her husband a week or so ago.  I saw him because of anemia.  He also has little bit of iron deficiency.  She last got iron back in October.  At that time, her iron studies showed a ferritin of 23 with iron saturation of 19%.  She does have back issues.  She did have some plain films of her spine in the last when she was here.  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   She is getting some physical therapy for this.  She has had no problems with nausea or vomiting.  There is been no change in bowel or bladder habits.  She has had no bleeding.  She can have a nice Thanksgiving with her family.  Thankfully, she has avoided COVID.    Overall, I would have to say that her performance status is probably ECOG 2.    Medications:  Allergies as of 07/21/2021       Reactions   Codeine Nausea And Vomiting   Metformin And Related Diarrhea   Shrimp [shellfish Allergy] Swelling        Medication List        Accurate as of July 21, 2021  9:46 AM. If you have any questions, ask your nurse or doctor.          acetaminophen 500 MG tablet Commonly known as: TYLENOL Take 500 mg by mouth every 6 (six) hours as needed for mild pain.   albuterol 108 (90 Base) MCG/ACT inhaler Commonly known as: ProAir HFA Inhale 2 puffs into the lungs every 4 (four) hours as needed for wheezing or shortness of breath.   ALPRAZolam 0.5 MG tablet Commonly known as: XANAX TAKE 1 TABLET(0.5 MG) BY MOUTH THREE TIMES DAILY   blood glucose meter kit and supplies Kit Dispense based on patient and  insurance preference. Use up to four times daily as directed.   colestipol 1 g tablet Commonly known as: COLESTID Take 1 g by mouth daily.   cyanocobalamin 1000 MCG tablet Take 1,000 mcg by mouth daily.   dicyclomine 20 MG tablet Commonly known as: BENTYL Take 1 tablet (20 mg total) by mouth 3 (three) times daily as needed.   diphenhydrAMINE 25 MG tablet Commonly known as: BENADRYL Take 25 mg by mouth every 6 (six) hours as needed (for allergies.).   empagliflozin 10 MG Tabs tablet Commonly known as: JARDIANCE Take 1 tablet (10 mg total) by mouth daily.   glipiZIDE 5 MG 24 hr tablet Commonly known as: GLUCOTROL XL Take 1 tablet (5 mg total) by mouth daily with breakfast.   hydrochlorothiazide 25 MG tablet Commonly known as: HYDRODIURIL TAKE 1 TABLET(25 MG) BY MOUTH DAILY   hydrocortisone 2.5 % cream Apply 1 application topically daily as needed (itchy dry skin).   lisinopril 20 MG tablet Commonly known as: ZESTRIL Take 1 tablet (20 mg total) by mouth daily.   loperamide 2 MG capsule Commonly known as: IMODIUM Take 4-6 mg by mouth daily as needed (for IBS symptoms).   Melatonin 10 MG Tabs Take 10 mg by mouth at  bedtime.   omeprazole 40 MG capsule Commonly known as: PRILOSEC Take 1 capsule (40 mg total) by mouth daily.   OneTouch Delica Lancets Fine Misc 1 each by Other route daily.   OneTouch Ultra test strip Generic drug: glucose blood TEST BLOOD SUGAR(S) ONCE DAILY IN THE MORNING   potassium chloride SA 20 MEQ tablet Commonly known as: Klor-Con M20 Take 1 tablet (20 mEq total) by mouth daily.   sertraline 100 MG tablet Commonly known as: ZOLOFT TAKE 1 TABLET(100 MG) BY MOUTH DAILY   simvastatin 80 MG tablet Commonly known as: ZOCOR TAKE 1 TABLET(80 MG) BY MOUTH DAILY   Stiolto Respimat 2.5-2.5 MCG/ACT Aers Generic drug: Tiotropium Bromide-Olodaterol Inhale 2 puffs into the lungs daily.   traMADol 50 MG tablet Commonly known as: ULTRAM TAKE 1  TABLET(50 MG) BY MOUTH EVERY 6 HOURS AS NEEDED        Allergies:  Allergies  Allergen Reactions   Codeine Nausea And Vomiting   Metformin And Related Diarrhea   Shrimp [Shellfish Allergy] Swelling    Past Medical History, Surgical history, Social history, and Family History were reviewed and updated.  Review of Systems: Review of Systems  Constitutional:  Positive for malaise/fatigue.  HENT: Negative.    Eyes: Negative.   Respiratory: Negative.    Cardiovascular: Negative.   Gastrointestinal:  Positive for abdominal pain.  Genitourinary: Negative.   Musculoskeletal: Negative.   Skin: Negative.   Neurological: Negative.   Endo/Heme/Allergies: Negative.   Psychiatric/Behavioral: Negative.      Physical Exam:  weight is 183 lb (83 kg). Her oral temperature is 98.9 F (37.2 C). Her blood pressure is 116/58 (abnormal) and her pulse is 85. Her respiration is 20 and oxygen saturation is 97%.   Wt Readings from Last 3 Encounters:  07/21/21 183 lb (83 kg)  06/17/21 181 lb 12.8 oz (82.5 kg)  06/08/21 184 lb (83.5 kg)    Physical Exam Vitals reviewed.  HENT:     Head: Normocephalic and atraumatic.  Eyes:     Pupils: Pupils are equal, round, and reactive to light.  Cardiovascular:     Rate and Rhythm: Normal rate and regular rhythm.     Heart sounds: Normal heart sounds.  Pulmonary:     Effort: Pulmonary effort is normal.     Breath sounds: Normal breath sounds.  Abdominal:     General: Bowel sounds are normal.     Palpations: Abdomen is soft.  Musculoskeletal:        General: No tenderness or deformity. Normal range of motion.     Cervical back: Normal range of motion.  Lymphadenopathy:     Cervical: No cervical adenopathy.  Skin:    General: Skin is warm and dry.     Findings: No erythema or rash.  Neurological:     Mental Status: She is alert and oriented to person, place, and time.  Psychiatric:        Behavior: Behavior normal.        Thought Content:  Thought content normal.        Judgment: Judgment normal.     Lab Results  Component Value Date   WBC 5.4 07/21/2021   HGB 12.3 07/21/2021   HCT 37.5 07/21/2021   MCV 83.7 07/21/2021   PLT 61 (L) 07/21/2021   Lab Results  Component Value Date   FERRITIN 23 06/08/2021   IRON 83 06/08/2021   TIBC 435 06/08/2021   UIBC 352 06/08/2021   IRONPCTSAT 19 (L)  06/08/2021   Lab Results  Component Value Date   RETICCTPCT 2.4 06/08/2021   RBC 4.48 07/21/2021   No results found for: KPAFRELGTCHN, LAMBDASER, KAPLAMBRATIO No results found for: IGGSERUM, IGA, IGMSERUM No results found for: Odetta Pink, SPEI   Chemistry      Component Value Date/Time   NA 138 07/21/2021 0846   NA 127 (L) 02/14/2017 1335   K 3.3 (L) 07/21/2021 0846   K 3.6 02/14/2017 1335   CL 95 (L) 07/21/2021 0846   CL 92 (L) 02/14/2017 1335   CO2 34 (H) 07/21/2021 0846   CO2 28 02/14/2017 1335   BUN 23 07/21/2021 0846   BUN 7 (L) 02/14/2017 1335   CREATININE 0.93 07/21/2021 0846   CREATININE 0.47 (L) 02/14/2017 1335   CREATININE 0.56 07/24/2014 1204      Component Value Date/Time   CALCIUM 9.7 07/21/2021 0846   CALCIUM 9.3 02/14/2017 1335   ALKPHOS 48 07/21/2021 0846   ALKPHOS 68 02/14/2017 1335   AST 16 07/21/2021 0846   ALT 10 07/21/2021 0846   BILITOT 0.9 07/21/2021 0846      Impression and Plan: Rachael Buck is a very pleasant 80 yo caucasian female with 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    We will see what her iron studies show.  Her blood sugar is quite high.  At this definitely is not helping her NASH.  We probably will have to get another ultrasound of her spleen.  Again, everything will hinge on her NASH.  Her platelet count is holding pretty steady right now.  This will always be on the lower side.  I will plan to see her back after the holidays now.  I am sure we will see her back when she comes in with her husband,  he also has an element of dementia.   [name], MD 11/22/20229:46 AM

## 2021-07-27 DIAGNOSIS — J449 Chronic obstructive pulmonary disease, unspecified: Secondary | ICD-10-CM | POA: Diagnosis not present

## 2021-07-30 ENCOUNTER — Ambulatory Visit: Payer: PPO | Attending: Chiropractic Medicine | Admitting: Physical Therapy

## 2021-07-30 DIAGNOSIS — M25552 Pain in left hip: Secondary | ICD-10-CM | POA: Insufficient documentation

## 2021-07-30 DIAGNOSIS — M6281 Muscle weakness (generalized): Secondary | ICD-10-CM | POA: Insufficient documentation

## 2021-07-30 DIAGNOSIS — R262 Difficulty in walking, not elsewhere classified: Secondary | ICD-10-CM | POA: Insufficient documentation

## 2021-07-30 DIAGNOSIS — R2689 Other abnormalities of gait and mobility: Secondary | ICD-10-CM | POA: Insufficient documentation

## 2021-07-31 ENCOUNTER — Other Ambulatory Visit: Payer: Self-pay | Admitting: Family Medicine

## 2021-08-17 ENCOUNTER — Ambulatory Visit: Payer: PPO | Admitting: Physical Therapy

## 2021-08-17 ENCOUNTER — Encounter: Payer: Self-pay | Admitting: Physical Therapy

## 2021-08-17 ENCOUNTER — Other Ambulatory Visit: Payer: Self-pay

## 2021-08-17 DIAGNOSIS — M6281 Muscle weakness (generalized): Secondary | ICD-10-CM

## 2021-08-17 DIAGNOSIS — R262 Difficulty in walking, not elsewhere classified: Secondary | ICD-10-CM | POA: Diagnosis not present

## 2021-08-17 DIAGNOSIS — M25552 Pain in left hip: Secondary | ICD-10-CM | POA: Diagnosis not present

## 2021-08-17 DIAGNOSIS — R2689 Other abnormalities of gait and mobility: Secondary | ICD-10-CM | POA: Diagnosis not present

## 2021-08-17 NOTE — Therapy (Signed)
[facility]. [place], [place], [address] Phone: [phone]   Fax:  [phone]  Physical Therapy Evaluation  Patient Details  Name: Rachael Buck MRN: [id number] Date of Birth: [birth year] Referring Provider (PT): Elisha Ponder   Encounter Date: 08/17/2021   PT End of Session - 08/17/21 1733     Visit Number 1    Number of Visits 16    Date for PT Re-Evaluation 10/12/21    PT Start Time 1500    PT Stop Time 2355    PT Time Calculation (min) 49 min    Activity Tolerance Patient tolerated treatment well;Patient limited by pain    Behavior During Therapy [facility] for tasks assessed/performed             Past Medical History:  Diagnosis Date   Anxiety    Arthritis    Asthma    Cirrhosis, nonalcoholic ([place])    Colon polyps    Diabetes mellitus    Diverticulitis    GERD (gastroesophageal reflux disease)    Hyperlipidemia    Hypertension    NASH (nonalcoholic steatohepatitis)    Obesity    Pneumonia     Past Surgical History:  Procedure Laterality Date   CHOLECYSTECTOMY  1981   ENDOVENOUS ABLATION SAPHENOUS VEIN W/ LASER Left 02/21/2018   endovenous laser ablation left greater saphenous vein by Tinnie Gens MD    [place]     bilateral. 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   Procedure: LEFT TOTAL KNEE ARTHROPLASTY;  Surgeon: Netta Cedars, MD;  Location: [facility];  Service: Orthopedics;  Laterality: Left;   VEIN SURGERY      There were no vitals filed for this visit.    Subjective Assessment - 08/17/21 1547     Subjective Patient arrives with C/O LBP. It is in the middle and into both hips, L much owrse than R. She has enlarged spleen, but that does not appear to be contributing to her pain. She has an appointment with Dr Nelva Bush on 12/28 for further assessment of her back  pain.                [facility] PT Assessment - 08/17/21 0001       Assessment   Medical Diagnosis LBP    Referring Provider (PT) Elisha Ponder    Next MD Visit 08/26/21      Balance Screen   Has the patient fallen in the past 6 months No      [place] residence    Living Arrangements Spouse/significant other    Available Help at Discharge Family    Type of [place] to enter    Entrance Stairs-Number of Steps [place] One level    Additional Comments Can access house at sunroom, 1 step to enter.      Prior Function   Level of Independence Independent    Vocation Retired    Leisure crafts, driving      Cognition   Overall Cognitive Status Within Functional Limits for tasks assessed      Posture/Postural Control   Posture Comments Mildly flattened Toracic curve      ROM / Strength   AROM / PROM /  Strength AROM;Strength      AROM   Overall AROM Comments BUE ROM WFL    AROM Assessment Site Hip;Knee;Ankle    Right/Left Hip Right;Left    Right Hip Extension 0    Right Hip Flexion 110    Right Hip External Rotation  25    Right Hip Internal Rotation  25    Right Hip ABduction 40    Left Hip Extension 0    Left Hip Flexion 95    Left Hip External Rotation  20    Left Hip Internal Rotation  20    Left Hip ABduction 40    Right/Left Knee Right;Left    Right Knee Extension 0    Right Knee Flexion 110    Left Knee Extension 0    Left Knee Flexion 110    Right/Left Ankle Right;Left    Right Ankle Dorsiflexion 15    Left Ankle Dorsiflexion 15      Strength   Overall Strength Comments BUE strength WFL    Strength Assessment Site Hip;Knee;Ankle    Right/Left Hip Right;Left    Right Hip Flexion 4-/5    Right Hip Extension 3+/5    Right Hip External Rotation  4-/5    Right Hip Internal Rotation 4-/5    Right Hip ABduction 3+/5    Left Hip Flexion 3+/5     Left Hip Extension 3/5    Left Hip External Rotation 3+/5    Left Hip Internal Rotation 3+/5    Left Hip ABduction 3+/5    Right/Left Knee Right;Left    Right Knee Flexion 4-/5    Right Knee Extension 4-/5    Left Knee Flexion 4-/5    Left Knee Extension 4-/5    Right/Left Ankle Right;Left    Right Ankle Dorsiflexion 4-/5    Right Ankle Plantar Flexion 3+/5    Left Ankle Dorsiflexion 4-/5    Left Ankle Plantar Flexion 3+/5      Palpation   Spinal mobility Patient's spinal moiblity limited in flexion and B side flexion due to abdomen.                        Objective measurements completed on examination: See above findings.                PT Education - 08/17/21 1733     Education Details POC, inital hEP    Person(s) Educated Patient    Methods Explanation;Demonstration;Handout    Comprehension Verbalized understanding;Returned demonstration              PT Short Term Goals - 08/17/21 1734       PT SHORT TERM GOAL #1   Title I with initial HEP.    Time 4    Period Weeks    Status New    Target Date 09/14/21               PT Long Term Goals - 08/17/21 1735       PT LONG TERM GOAL #1   Title I with final HEP    Time 8    Period Weeks    Status New    Target Date 10/12/21      PT LONG TERM GOAL #2   Title Increase FOTO score by at least 20 points.    Baseline Inital attempt to complete was unsuccessful.    Time 8    Period Weeks    Status New  Target Date 10/12/21      PT LONG TERM GOAL #3   Title Patient will report pain in back and L hip no > 4/10 with all activity.    Baseline 8/10    Time 8    Period Weeks    Status New    Target Date 10/12/21      PT LONG TERM GOAL #4   Title 5 x STS in < 15 seconds to demosntrate improved strength and stability of lower body.    Time 8    Period Weeks    Status New    Target Date 10/12/21      PT LONG TERM GOAL #5   Title Patient will be able to walk > 500' without  increase in pain in back or L hip.    Baseline 150'    Time 8    Period Weeks    Status New    Target Date 10/12/21                    Plan - 08/17/21 1547     PT Next Visit Plan Complete FOTO (it closed patinet out today), Update HEp, assess e stim fro back pain.    PT Home Exercise Plan Access Code: [email or web address]  URL: [email or web address]  Date: 08/17/2021  Prepared by: Ethel Rana    Exercises  Hooklying Clamshell with Resistance - 1 x daily - 7 x weekly - 1 sets - 10 reps  Supine Bridge - 1 x daily - 7 x weekly - 1 sets - 10 reps  Supine Single Knee to Chest Stretch - 1 x daily - 7 x weekly - 3 sets - 10 sec hold  Supine Double Knee to Chest - 1 x daily - 7 x weekly - 3 sets - 10 sec hold             Patient will benefit from skilled therapeutic intervention in order to improve the following deficits and impairments:     Visit Diagnosis: Pain in left hip  Difficulty in walking, not elsewhere classified  Other abnormalities of gait and mobility  Muscle weakness (generalized)     Problem List Patient Active Problem List   Diagnosis Date Noted   Pulmonary nodule 1 cm or greater in diameter 01/13/2021   Severe sepsis ([place]) 12/09/2020   Hypokalemia 12/09/2020   Cirrhosis of liver ([place]) 02/17/2018   S/P total knee replacement, 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   Thrombocytopenia ([place]) 02/15/2017   Varicose veins of left lower extremity with complications 17/91/5056   Age-related cognitive decline 06/02/2015   Fatigue 07/19/2014   Orthostatic hypotension 05/14/2013   Nail abnormality 09/04/2012   Bleeding disorder ([place]) 05/08/2012   Varices, 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 [place], [place], [address] Phone: [phone]   Fax:  [phone]  Name: Rachael Buck MRN: [id number] Date of Birth: 1941-06-18

## 2021-08-17 NOTE — Patient Instructions (Signed)
Access Code: [email or web address] URL: [email or web address] Date: 08/17/2021 Prepared by: Ethel Rana  Exercises Hooklying Clamshell with Resistance - 1 x daily - 7 x weekly - 1 sets - 10 reps Supine Bridge - 1 x daily - 7 x weekly - 1 sets - 10 reps Supine Single Knee to Chest Stretch - 1 x daily - 7 x weekly - 3 sets - 10 sec hold Supine Double Knee to Chest - 1 x daily - 7 x weekly - 3 sets - 10 sec hold

## 2021-08-20 ENCOUNTER — Other Ambulatory Visit: Payer: Self-pay

## 2021-08-20 ENCOUNTER — Ambulatory Visit (HOSPITAL_BASED_OUTPATIENT_CLINIC_OR_DEPARTMENT_OTHER)
Admission: RE | Admit: 2021-08-20 | Discharge: 2021-08-20 | Disposition: A | Discharge status: Home / Self Care | Payer: PPO | Source: Ambulatory Visit | Attending: Hematology & Oncology | Admitting: Hematology & Oncology

## 2021-08-20 ENCOUNTER — Telehealth: Payer: Self-pay

## 2021-08-20 DIAGNOSIS — K7581 Nonalcoholic steatohepatitis (NASH): Secondary | ICD-10-CM | POA: Diagnosis not present

## 2021-08-20 DIAGNOSIS — K7460 Unspecified cirrhosis of liver: Secondary | ICD-10-CM | POA: Diagnosis not present

## 2021-08-20 NOTE — Telephone Encounter (Signed)
Called and informed patient of u/s results, patient verbalized understanding and denies any questions or concerns at this time.

## 2021-08-20 NOTE — Telephone Encounter (Signed)
-----   Message from [name], MD sent at 08/20/2021  3:54 PM EST ----- Call -- the spleen is actually smaller!! Yeah!!!  Merry Christmas!!  Rachael Buck

## 2021-08-25 ENCOUNTER — Ambulatory Visit: Payer: PPO | Admitting: Physical Therapy

## 2021-08-26 ENCOUNTER — Other Ambulatory Visit: Payer: Self-pay | Admitting: Hematology & Oncology

## 2021-08-26 DIAGNOSIS — M25559 Pain in unspecified hip: Secondary | ICD-10-CM

## 2021-08-26 DIAGNOSIS — M545 Low back pain, unspecified: Secondary | ICD-10-CM

## 2021-08-27 ENCOUNTER — Ambulatory Visit: Payer: PPO | Admitting: Physical Therapy

## 2021-09-02 ENCOUNTER — Other Ambulatory Visit: Payer: Self-pay

## 2021-09-02 ENCOUNTER — Inpatient Hospital Stay (HOSPITAL_BASED_OUTPATIENT_CLINIC_OR_DEPARTMENT_OTHER): Payer: PPO | Admitting: Hematology & Oncology

## 2021-09-02 ENCOUNTER — Inpatient Hospital Stay: Payer: PPO | Attending: Family

## 2021-09-02 ENCOUNTER — Encounter: Payer: Self-pay | Admitting: Hematology & Oncology

## 2021-09-02 VITALS — BP 91/74 | HR 84 | Temp 98.3°F | Resp 18 | Wt 184.0 lb

## 2021-09-02 DIAGNOSIS — Z7984 Long term (current) use of oral hypoglycemic drugs: Secondary | ICD-10-CM | POA: Diagnosis not present

## 2021-09-02 DIAGNOSIS — R161 Splenomegaly, not elsewhere classified: Secondary | ICD-10-CM | POA: Diagnosis not present

## 2021-09-02 DIAGNOSIS — K7581 Nonalcoholic steatohepatitis (NASH): Secondary | ICD-10-CM | POA: Diagnosis not present

## 2021-09-02 DIAGNOSIS — R112 Nausea with vomiting, unspecified: Secondary | ICD-10-CM | POA: Insufficient documentation

## 2021-09-02 DIAGNOSIS — Z79899 Other long term (current) drug therapy: Secondary | ICD-10-CM | POA: Diagnosis not present

## 2021-09-02 DIAGNOSIS — D696 Thrombocytopenia, unspecified: Secondary | ICD-10-CM | POA: Insufficient documentation

## 2021-09-02 DIAGNOSIS — D699 Hemorrhagic condition, unspecified: Secondary | ICD-10-CM

## 2021-09-02 LAB — CBC WITH DIFFERENTIAL (CANCER CENTER ONLY)
Abs Immature Granulocytes: 0.07 10*3/uL (ref 0.00–0.07)
Basophils Absolute: 0 10*3/uL (ref 0.0–0.1)
Basophils Relative: 1 %
Eosinophils Absolute: 0 10*3/uL (ref 0.0–0.5)
Eosinophils Relative: 1 %
HCT: 33.9 % — ABNORMAL LOW (ref 36.0–46.0)
Hemoglobin: 11.4 g/dL — ABNORMAL LOW (ref 12.0–15.0)
Immature Granulocytes: 1 %
Lymphocytes Relative: 18 %
Lymphs Abs: 1.1 10*3/uL (ref 0.7–4.0)
MCH: 28.3 pg (ref 26.0–34.0)
MCHC: 33.6 g/dL (ref 30.0–36.0)
MCV: 84.1 fL (ref 80.0–100.0)
Monocytes Absolute: 0.6 10*3/uL (ref 0.1–1.0)
Monocytes Relative: 10 %
Neutro Abs: 4.4 10*3/uL (ref 1.7–7.7)
Neutrophils Relative %: 69 %
Platelet Count: 69 10*3/uL — ABNORMAL LOW (ref 150–400)
RBC: 4.03 MIL/uL (ref 3.87–5.11)
RDW: 16.8 % — ABNORMAL HIGH (ref 11.5–15.5)
WBC Count: 6.3 10*3/uL (ref 4.0–10.5)
nRBC: 0 % (ref 0.0–0.2)

## 2021-09-02 LAB — CMP (CANCER CENTER ONLY)
ALT: 12 U/L (ref 0–44)
AST: 17 U/L (ref 15–41)
Albumin: 4.3 g/dL (ref 3.5–5.0)
Alkaline Phosphatase: 51 U/L (ref 38–126)
Anion gap: 7 (ref 5–15)
BUN: 17 mg/dL (ref 8–23)
CO2: 34 mmol/L — ABNORMAL HIGH (ref 22–32)
Calcium: 9.6 mg/dL (ref 8.9–10.3)
Chloride: 96 mmol/L — ABNORMAL LOW (ref 98–111)
Creatinine: 0.87 mg/dL (ref 0.44–1.00)
GFR, Estimated: >60 mL/min (ref >60)
Glucose, Bld: 160 mg/dL — ABNORMAL HIGH (ref 70–99)
Potassium: 3.7 mmol/L (ref 3.5–5.1)
Sodium: 137 mmol/L (ref 135–145)
Total Bilirubin: 0.9 mg/dL (ref 0.3–1.2)
Total Protein: 6.9 g/dL (ref 6.5–8.1)

## 2021-09-02 LAB — LACTATE DEHYDROGENASE: LDH: 150 U/L (ref 98–192)

## 2021-09-02 NOTE — Progress Notes (Signed)
Hematology and Oncology Follow Up Visit  Rachael Buck [id number] [birth year] 81 y.o. 09/02/2021   Principle Diagnosis:  Thrombocytopenia -- NASH/Splenomegaly ; Immune based thrombocytopenia Iron deficiency    Current Therapy:        Nplate as indicated  IV iron as indicated  -- Feraheme given on -06/12/2021   Interim History:  Rachael Buck is here today for follow-up.  She is trying to deal with her husband and his health issues.  He still having problems with vomiting.  Of note, back in December, we did do a ultrasound of her spleen.  Thankfully, this showed that the splenic size was smaller.  The splenic volume was 800 cm.  She has had no problems with bleeding.  She has had no problems with change in bowel or bladder habits.  There has been no cough.  She has had no rashes.  There is been no issues with fever.  Her last iron studies back in no November showed a ferritin of 40 with an iron saturation of 25%.  Overall, her performance status is ECOG 1.    Medications:  Allergies as of 09/02/2021       Reactions   Codeine Nausea And Vomiting   Metformin And Related Diarrhea   Shrimp [shellfish Allergy] Swelling        Medication List        Accurate as of September 02, 2021 11:56 AM. If you have any questions, ask your nurse or doctor.          acetaminophen 500 MG tablet Commonly known as: TYLENOL Take 500 mg by mouth every 6 (six) hours as needed for mild pain.   albuterol 108 (90 Base) MCG/ACT inhaler Commonly known as: ProAir HFA Inhale 2 puffs into the lungs every 4 (four) hours as needed for wheezing or shortness of breath.   ALPRAZolam 0.5 MG tablet Commonly known as: XANAX TAKE 1 TABLET(0.5 MG) BY MOUTH THREE TIMES DAILY   blood glucose meter kit and supplies Kit Dispense based on patient and insurance preference. Use up to four times daily as directed.   colestipol 1 g tablet Commonly known as: COLESTID Take 1 g by mouth daily.    cyanocobalamin 1000 MCG tablet Take 1,000 mcg by mouth daily.   dicyclomine 20 MG tablet Commonly known as: BENTYL Take 1 tablet (20 mg total) by mouth 3 (three) times daily as needed.   diphenhydrAMINE 25 MG tablet Commonly known as: BENADRYL Take 25 mg by mouth every 6 (six) hours as needed (for allergies.).   empagliflozin 10 MG Tabs tablet Commonly known as: JARDIANCE Take 1 tablet (10 mg total) by mouth daily.   glipiZIDE 5 MG 24 hr tablet Commonly known as: GLUCOTROL XL Take 1 tablet (5 mg total) by mouth daily with breakfast.   hydrochlorothiazide 25 MG tablet Commonly known as: HYDRODIURIL TAKE 1 TABLET(25 MG) BY MOUTH DAILY   hydrocortisone 2.5 % cream Apply 1 application topically daily as needed (itchy dry skin).   lisinopril 20 MG tablet Commonly known as: ZESTRIL Take 1 tablet (20 mg total) by mouth daily.   loperamide 2 MG capsule Commonly known as: IMODIUM Take 4-6 mg by mouth daily as needed (for IBS symptoms).   Melatonin 10 MG Tabs Take 10 mg by mouth at bedtime.   omeprazole 40 MG capsule Commonly known as: PRILOSEC TAKE 1 CAPSULE(40 MG) BY MOUTH DAILY   OneTouch Delica Lancets Fine Misc 1 each by Other route daily.   OneTouch Ultra  test strip Generic drug: glucose blood TEST BLOOD SUGAR(S) ONCE DAILY IN THE MORNING   potassium chloride SA 20 MEQ tablet Commonly known as: Klor-Con M20 Take 1 tablet (20 mEq total) by mouth daily.   sertraline 100 MG tablet Commonly known as: ZOLOFT TAKE 1 TABLET(100 MG) BY MOUTH DAILY   simvastatin 80 MG tablet Commonly known as: ZOCOR TAKE 1 TABLET(80 MG) BY MOUTH DAILY   Stiolto Respimat 2.5-2.5 MCG/ACT Aers Generic drug: Tiotropium Bromide-Olodaterol Inhale 2 puffs into the lungs daily.   traMADol 50 MG tablet Commonly known as: ULTRAM TAKE 1 TABLET(50 MG) BY MOUTH EVERY 6 HOURS AS NEEDED        Allergies:  Allergies  Allergen Reactions   Codeine Nausea And Vomiting   Metformin And  Related Diarrhea   Shrimp [Shellfish Allergy] Swelling    Past Medical History, Surgical history, Social history, and Family History were reviewed and updated.  Review of Systems: Review of Systems  Constitutional:  Positive for malaise/fatigue.  HENT: Negative.    Eyes: Negative.   Respiratory: Negative.    Cardiovascular: Negative.   Gastrointestinal:  Positive for abdominal pain.  Genitourinary: Negative.   Musculoskeletal: Negative.   Skin: Negative.   Neurological: Negative.   Endo/Heme/Allergies: Negative.   Psychiatric/Behavioral: Negative.      Physical Exam:  weight is 184 lb (83.5 kg). Her oral temperature is 98.3 F (36.8 C). Her blood pressure is 91/74 and her pulse is 84. Her respiration is 18 and oxygen saturation is 95%.   Wt Readings from Last 3 Encounters:  09/02/21 184 lb (83.5 kg)  07/21/21 183 lb (83 kg)  06/17/21 181 lb 12.8 oz (82.5 kg)    Physical Exam Vitals reviewed.  HENT:     Head: Normocephalic and atraumatic.  Eyes:     Pupils: Pupils are equal, round, and reactive to light.  Cardiovascular:     Rate and Rhythm: Normal rate and regular rhythm.     Heart sounds: Normal heart sounds.  Pulmonary:     Effort: Pulmonary effort is normal.     Breath sounds: Normal breath sounds.  Abdominal:     General: Bowel sounds are normal.     Palpations: Abdomen is soft.  Musculoskeletal:        General: No tenderness or deformity. Normal range of motion.     Cervical back: Normal range of motion.  Lymphadenopathy:     Cervical: No cervical adenopathy.  Skin:    General: Skin is warm and dry.     Findings: No erythema or rash.  Neurological:     Mental Status: She is alert and oriented to person, place, and time.  Psychiatric:        Behavior: Behavior normal.        Thought Content: Thought content normal.        Judgment: Judgment normal.     Lab Results  Component Value Date   WBC 5.4 07/21/2021   HGB 12.3 07/21/2021   HCT 37.5  07/21/2021   MCV 83.7 07/21/2021   PLT 61 (L) 07/21/2021   Lab Results  Component Value Date   FERRITIN 40 07/21/2021   IRON 99 07/21/2021   TIBC 393 07/21/2021   UIBC 294 07/21/2021   IRONPCTSAT 25 07/21/2021   Lab Results  Component Value Date   RETICCTPCT 2.4 06/08/2021   RBC 4.48 07/21/2021   No results found for: KPAFRELGTCHN, LAMBDASER, KAPLAMBRATIO No results found for: IGGSERUM, IGA, IGMSERUM No results found for:  Odetta Pink, SPEI   Chemistry      Component Value Date/Time   NA 137 09/02/2021 1035   NA 127 (L) 02/14/2017 1335   K 3.7 09/02/2021 1035   K 3.6 02/14/2017 1335   CL 96 (L) 09/02/2021 1035   CL 92 (L) 02/14/2017 1335   CO2 34 (H) 09/02/2021 1035   CO2 28 02/14/2017 1335   BUN 17 09/02/2021 1035   BUN 7 (L) 02/14/2017 1335   CREATININE 0.87 09/02/2021 1035   CREATININE 0.47 (L) 02/14/2017 1335   CREATININE 0.56 07/24/2014 1204      Component Value Date/Time   CALCIUM 9.6 09/02/2021 1035   CALCIUM 9.3 02/14/2017 1335   ALKPHOS 51 09/02/2021 1035   ALKPHOS 68 02/14/2017 1335   AST 17 09/02/2021 1035   ALT 12 09/02/2021 1035   BILITOT 0.9 09/02/2021 1035      Impression and Plan: Ms. 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    So far, everything is holding pretty steady.  I am glad that her splenic size is smaller.  I do not think we have to do another ultrasound of the spleen probably until the summer.  I do feel bad for her poor husband.  He is having a tough time right now.  I think we can get her through the Winter.  We will plan to get her back in the Spring.  This way, she does not have to come in with any bad weather.   [name], MD 1/4/202311:56 AM

## 2021-09-03 LAB — IRON AND IRON BINDING CAPACITY (CC-WL,HP ONLY)
Iron: 77 ug/dL (ref 28–170)
Saturation Ratios: 17 % (ref 10.4–31.8)
TIBC: 447 ug/dL (ref 250–450)
UIBC: 370 ug/dL (ref 148–442)

## 2021-09-03 LAB — FERRITIN: Ferritin: 29 ng/mL (ref 11–307)

## 2021-09-22 ENCOUNTER — Other Ambulatory Visit: Payer: Self-pay | Admitting: Hematology & Oncology

## 2021-09-22 DIAGNOSIS — K7581 Nonalcoholic steatohepatitis (NASH): Secondary | ICD-10-CM

## 2021-09-24 ENCOUNTER — Other Ambulatory Visit: Payer: Self-pay | Admitting: Hematology & Oncology

## 2021-09-24 DIAGNOSIS — M25559 Pain in unspecified hip: Secondary | ICD-10-CM

## 2021-09-24 DIAGNOSIS — M545 Low back pain, unspecified: Secondary | ICD-10-CM

## 2021-09-25 ENCOUNTER — Ambulatory Visit (HOSPITAL_COMMUNITY)
Admission: RE | Admit: 2021-09-25 | Discharge: 2021-09-25 | Disposition: A | Discharge status: Home / Self Care | Payer: PPO | Source: Ambulatory Visit | Attending: Hematology & Oncology | Admitting: Hematology & Oncology

## 2021-09-25 ENCOUNTER — Other Ambulatory Visit: Payer: Self-pay | Admitting: Hematology & Oncology

## 2021-09-25 ENCOUNTER — Other Ambulatory Visit: Payer: Self-pay

## 2021-09-25 DIAGNOSIS — K7581 Nonalcoholic steatohepatitis (NASH): Secondary | ICD-10-CM | POA: Insufficient documentation

## 2021-09-25 DIAGNOSIS — R188 Other ascites: Secondary | ICD-10-CM | POA: Diagnosis not present

## 2021-09-25 DIAGNOSIS — K7460 Unspecified cirrhosis of liver: Secondary | ICD-10-CM | POA: Diagnosis not present

## 2021-09-25 MED ORDER — LIDOCAINE HCL 1 % IJ SOLN
INTRAMUSCULAR | Status: AC
  Filled 2021-09-25: qty 20

## 2021-09-25 NOTE — Progress Notes (Signed)
Patient ID: Rachael Buck, female   DOB: 1940/10/14, 81 y.o.   MRN: [id number] Pt presented to [place] dept today for paracentesis. On limited US abd in all four quadrants there is no significant peritoneal fluid present. Procedure cancelled. Pt notified.

## 2021-10-10 ENCOUNTER — Encounter: Payer: Self-pay | Admitting: Family Medicine

## 2021-10-10 NOTE — Progress Notes (Addendum)
[place] at [facility] [address] [place], [place] [address] [phone]  Date:  10/12/2021   Name:  Rachael Buck   DOB:  12/04/1940   MRN:  [id number]  PCP:  [name], MD    Chief Complaint: Covid Positive (Tested positive last Tuesday. Sxs today: rattling in chest and congestion, pain R side of chest, ShOB)   History of Present Illness:  Rachael Buck is a 81 y.o. very pleasant female patient who presents with the following:  Pt seen today with concern of covid 73 Last seen by myself 10/22- History of diabetes, hypertension, IBS, [place], cirrhosis, hyperlipidemia, thrombocytopenia   Her husband Rachael Buck is having more health struggles as of late  He is actually admitted with covid right now and is quite ill  Pt herself tested + for covid a week ago; she was exposed to covid and so tested herself-before symptoms actually began Her sx began the same day- feeling tired She has a cough but no fever She will feel SOB when she is walking  She did an albuerol treatment this am which did help  No vomiting or diarrhea She is not sure if she is getting worse at all She notes pain in her right chest -she feels like most of her congestion is in the right lung   Patient Active Problem List   Diagnosis Date Noted   Pulmonary nodule 1 cm or greater in diameter 01/13/2021   Severe sepsis ([place]) 12/09/2020   Hypokalemia 12/09/2020   Cirrhosis of liver ([place]) 02/17/2018   S/P total knee replacement, 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   Thrombocytopenia ([place]) 02/15/2017   Varicose veins of left lower extremity with complications 53/64/6803   Age-related cognitive decline 06/02/2015   Fatigue 07/19/2014   Orthostatic hypotension 05/14/2013   Nail abnormality 09/04/2012   Bleeding disorder ([place]) 05/08/2012   Varices, 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   NASH (nonalcoholic steatohepatitis) 03/21/2012   HTN (hypertension) 12/21/2011   Depression  03/28/2011   IBS (irritable bowel syndrome) 03/23/2011   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[place]) 08/27/2010   DYSPNEA ON EXERTION 07/27/2010   Uncontrolled diabetes mellitus 07/24/2010   MIXED HYPERLIPIDEMIA 07/24/2010   Obesity, unspecified 07/24/2010   ALLERGIC RHINITIS CAUSE UNSPECIFIED 07/24/2010   REFLUX ESOPHAGITIS 07/24/2010   PAIN IN JOINT PELVIC REGION AND THIGH 07/24/2010    Past Medical History:  Diagnosis Date   Anxiety    Arthritis    Asthma    Cirrhosis, nonalcoholic ([place])    Colon polyps    Diabetes mellitus    Diverticulitis    GERD (gastroesophageal reflux disease)    Hyperlipidemia    Hypertension    NASH (nonalcoholic steatohepatitis)    Obesity    Pneumonia     Past Surgical History:  Procedure Laterality Date   CHOLECYSTECTOMY  1981   ENDOVENOUS ABLATION SAPHENOUS VEIN W/ LASER Left 02/21/2018   endovenous laser ablation left greater saphenous vein by Tinnie Gens MD    [place]     bilateral. 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   Procedure: LEFT TOTAL KNEE ARTHROPLASTY;  Surgeon: Netta Cedars, MD;  Location: [place];  Service: Orthopedics;  Laterality: Left;   VEIN SURGERY      Social History   Tobacco Use   Smoking status: Former  Packs/day: 1.50    Years: 15.00    Pack years: 22.50    Types: Cigarettes   Smokeless tobacco: Never   Tobacco comments:    Quit 2002.  Vaping Use   Vaping Use: Never used  Substance Use Topics   Alcohol use: No   Drug use: No    Family History  Problem Relation Age of Onset   Stomach cancer Mother    Diabetes Sister    Breast cancer Daughter 39   Breast cancer Maternal Aunt    Diabetes Brother    Cancer Other    GER disease Other    Obesity Other     Allergies  Allergen Reactions   Codeine Nausea And Vomiting   Metformin And Related Diarrhea    Shrimp [Shellfish Allergy] Swelling    Medication list has been reviewed and updated.  Current Outpatient Medications on File Prior to Visit  Medication Sig Dispense Refill   acetaminophen (TYLENOL) 500 MG tablet Take 500 mg by mouth every 6 (six) hours as needed for mild pain.     albuterol (PROAIR HFA) 108 (90 Base) MCG/ACT inhaler Inhale 2 puffs into the lungs every 4 (four) hours as needed for wheezing or shortness of breath. 1 Inhaler 6   ALPRAZolam (XANAX) 0.5 MG tablet TAKE 1 TABLET(0.5 MG) BY MOUTH THREE TIMES DAILY 90 tablet 2   blood glucose meter kit and supplies KIT Dispense based on patient and insurance preference. Use up to four times daily as directed. 1 each 0   colestipol (COLESTID) 1 g tablet Take 1 g by mouth daily.     cyanocobalamin 1000 MCG tablet Take 1,000 mcg by mouth daily.     dicyclomine (BENTYL) 20 MG tablet Take 1 tablet (20 mg total) by mouth 3 (three) times daily as needed. 360 tablet 0   diphenhydrAMINE (BENADRYL) 25 MG tablet Take 25 mg by mouth every 6 (six) hours as needed (for allergies.).     empagliflozin (JARDIANCE) 10 MG TABS tablet Take 1 tablet (10 mg total) by mouth daily. 90 tablet 3   glipiZIDE (GLUCOTROL XL) 5 MG 24 hr tablet Take 1 tablet (5 mg total) by mouth daily with breakfast. 90 tablet 3   hydrochlorothiazide (HYDRODIURIL) 25 MG tablet TAKE 1 TABLET(25 MG) BY MOUTH DAILY 90 tablet 1   hydrocortisone 2.5 % cream Apply 1 application topically daily as needed (itchy dry skin).     lisinopril (ZESTRIL) 20 MG tablet Take 1 tablet (20 mg total) by mouth daily. 90 tablet 3   loperamide (IMODIUM) 2 MG capsule Take 4-6 mg by mouth daily as needed (for IBS symptoms).     Melatonin 10 MG TABS Take 10 mg by mouth at bedtime.     omeprazole (PRILOSEC) 40 MG capsule TAKE 1 CAPSULE(40 MG) BY MOUTH DAILY 90 capsule 1   ONETOUCH DELICA LANCETS FINE MISC 1 each by Other route daily. 100 each 12   ONETOUCH ULTRA test strip TEST BLOOD SUGAR(S) ONCE DAILY IN  THE MORNING 100 strip 12   potassium chloride SA (KLOR-CON M20) 20 MEQ tablet Take 1 tablet (20 mEq total) by mouth daily. 90 tablet 3   sertraline (ZOLOFT) 100 MG tablet TAKE 1 TABLET(100 MG) BY MOUTH DAILY 90 tablet 1   simvastatin (ZOCOR) 80 MG tablet TAKE 1 TABLET(80 MG) BY MOUTH DAILY 90 tablet 1   Tiotropium Bromide-Olodaterol (STIOLTO RESPIMAT) 2.5-2.5 MCG/ACT AERS Inhale 2 puffs into the lungs daily. 4 g 0   traMADol (ULTRAM) 50  MG tablet TAKE 1 TABLET(50 MG) BY MOUTH EVERY 6 HOURS AS NEEDED 60 tablet 0   No current facility-administered medications on file prior to visit.    Review of Systems:  As per HPI- otherwise negative.   Physical Examination: Vitals:   10/12/21 1351  BP: 112/64  Pulse: (!) 101  Resp: 20  Temp: 98 F (36.7 C)  SpO2: 94%   Vitals:   10/12/21 1351  Weight: 182 lb (82.6 kg)   Body mass index is 29.38 kg/m. Ideal Body Weight:    GEN: no acute distress.  Looks well, overweight HEENT: Atraumatic, Normocephalic.  Ears and Nose: No external deformity. CV: RRR, No M/G/R. No JVD. No thrill. No extra heart sounds. PULM: CTA B, no wheezes, crackles, rhonchi. No retractions. No resp. distress. No accessory muscle use. ABD: S, NT, ND, +BS. No rebound. No HSM. EXTR: No c/c/e PSYCH: Normally interactive. Conversant.   EKG: Sinus rhythm with right bundle branch block, rate 100 Compared with EKG from April 2021 no significant changes noted  Assessment and Plan: COVID-19 - Plan: DG Chest 2 View, EKG 12-Lead, molnupiravir EUA (LAGEVRIO) 200 mg CAPS capsule  Chest tightness - Plan: Troponin I -, CANCELED: Troponin I (High Sensitivity), CANCELED: Troponin T, High Sensitivity (hs-TnT), CANCELED: Troponin I -  Thrombocytopenia (HCC) - Plan: CBC  Patient seen today with concern of COVID-19.  Her husband is ill with the same and is admitted She is at the outside boundary for use antivirals, but given her age and comorbidities we will go ahead and treat with  Molnupiravir  Chest tightness is likely due to COVID-19, but EKG is difficult to interpret.  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 Calcified plaques in the aortic arch. Lungs are hyperinflated with no focal consolidation identified. No pleural effusion or pneumothorax identified. IMPRESSION: Mild emphysematous changes with no acute process identified. Electronically Signed   By: Ofilia Neas M.D.   On: 10/12/2021 14:50     Addendum 2/15, received labs as below.  Message to patient Mild anemia is longstanding as are low platelets Most recent hematology visit was in January Results for orders placed or performed in visit on 10/12/21  CBC  Result Value Ref Range   WBC 5.6 4.0 - 10.5 K/uL   RBC 4.08 3.87 - 5.11 Mil/uL   Platelets 68.0 (L) 150.0 - 400.0 K/uL   Hemoglobin 11.4 (L) 12.0 - 15.0 g/dL   HCT 33.3 (L) 36.0 - 46.0 %   MCV 81.6 78.0 - 100.0 fl   MCHC 34.2 30.0 - 36.0 g/dL   RDW 17.0 (H) 11.5 - 15.5 %  Troponin I -  Result Value Ref Range   Troponin I <3 < OR = 47 ng/L

## 2021-10-11 ENCOUNTER — Other Ambulatory Visit: Payer: Self-pay | Admitting: Family Medicine

## 2021-10-12 ENCOUNTER — Ambulatory Visit (HOSPITAL_BASED_OUTPATIENT_CLINIC_OR_DEPARTMENT_OTHER)
Admission: RE | Admit: 2021-10-12 | Discharge: 2021-10-12 | Disposition: A | Discharge status: Home / Self Care | Payer: PPO | Source: Ambulatory Visit | Attending: Family Medicine | Admitting: Family Medicine

## 2021-10-12 ENCOUNTER — Ambulatory Visit (INDEPENDENT_AMBULATORY_CARE_PROVIDER_SITE_OTHER): Payer: PPO | Admitting: Family Medicine

## 2021-10-12 ENCOUNTER — Encounter: Payer: Self-pay | Admitting: Family Medicine

## 2021-10-12 ENCOUNTER — Other Ambulatory Visit: Payer: Self-pay

## 2021-10-12 VITALS — BP 112/64 | HR 101 | Temp 98.0°F | Resp 20 | Wt 182.0 lb

## 2021-10-12 DIAGNOSIS — D696 Thrombocytopenia, unspecified: Secondary | ICD-10-CM | POA: Diagnosis not present

## 2021-10-12 DIAGNOSIS — J439 Emphysema, unspecified: Secondary | ICD-10-CM | POA: Diagnosis not present

## 2021-10-12 DIAGNOSIS — U071 COVID-19: Secondary | ICD-10-CM

## 2021-10-12 DIAGNOSIS — R0789 Other chest pain: Secondary | ICD-10-CM | POA: Diagnosis not present

## 2021-10-12 LAB — TROPONIN I: Troponin I: <3 ng/L (ref <=47)

## 2021-10-12 MED ORDER — MOLNUPIRAVIR EUA 200MG CAPSULE: 4.0000 | ORAL_CAPSULE | Freq: Two times a day (BID) | ORAL | 0 refills | Status: AC

## 2021-10-12 NOTE — Patient Instructions (Signed)
Please go to lab and then x-ray I will be in touch with your reports I called in anti-viral medication for your covid illness I hope you feel better soon!  Please let me know if you are not improving and seek care if getting worse

## 2021-10-13 LAB — CBC
HCT: 33.3 % — ABNORMAL LOW (ref 36.0–46.0)
Hemoglobin: 11.4 g/dL — ABNORMAL LOW (ref 12.0–15.0)
MCHC: 34.2 g/dL (ref 30.0–36.0)
MCV: 81.6 fl (ref 78.0–100.0)
Platelets: 68 10*3/uL — ABNORMAL LOW (ref 150.0–400.0)
RBC: 4.08 Mil/uL (ref 3.87–5.11)
RDW: 17 % — ABNORMAL HIGH (ref 11.5–15.5)
WBC: 5.6 10*3/uL (ref 4.0–10.5)

## 2021-10-14 ENCOUNTER — Encounter: Payer: Self-pay | Admitting: Family Medicine

## 2021-10-16 IMAGING — DX DG CHEST 1V PORT
1 series · 2 of 2 positions shown · non-contrast
Comparison: 12/07/2019

CLINICAL DATA: Short of breath for 3 days, history of asthma and
diabetes

EXAM:
PORTABLE CHEST 1 VIEW

[Series 1: chest · 0.14mm/px · 2 of 2 slices shown]
[im 1/2]
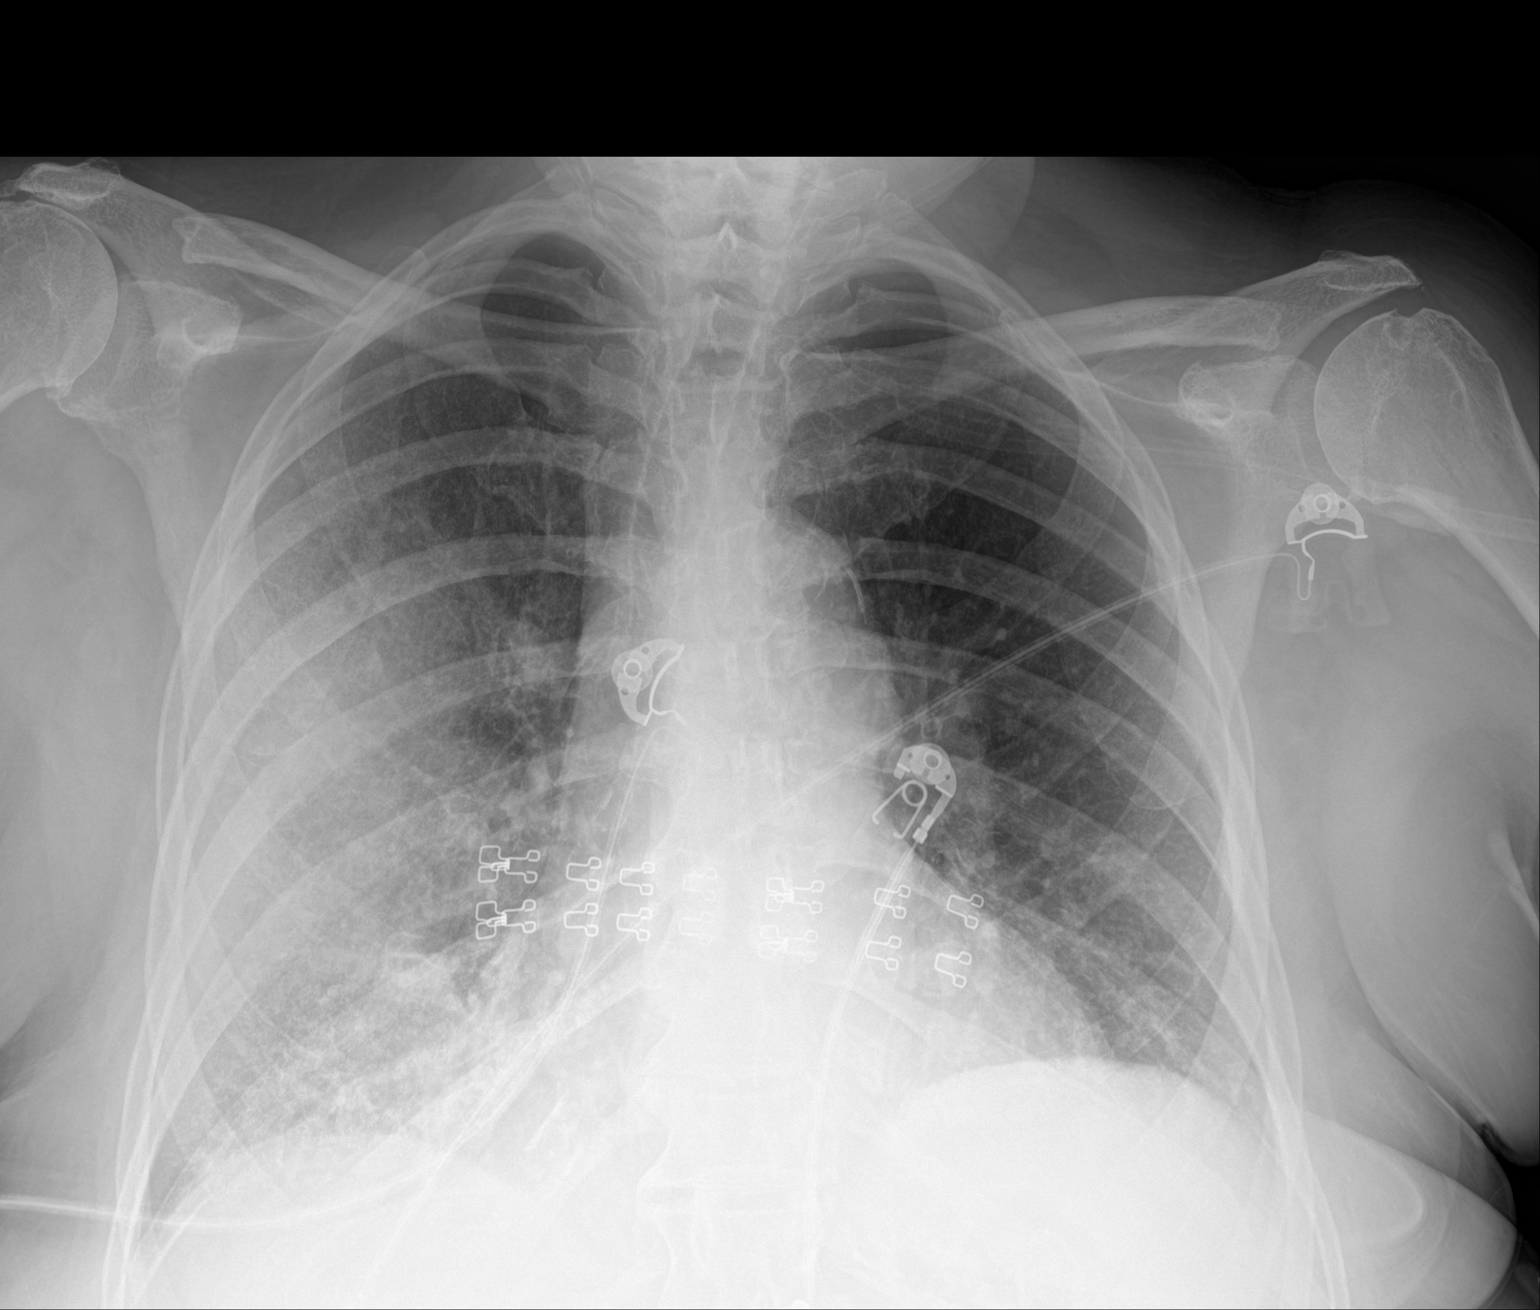
[im 2/2]
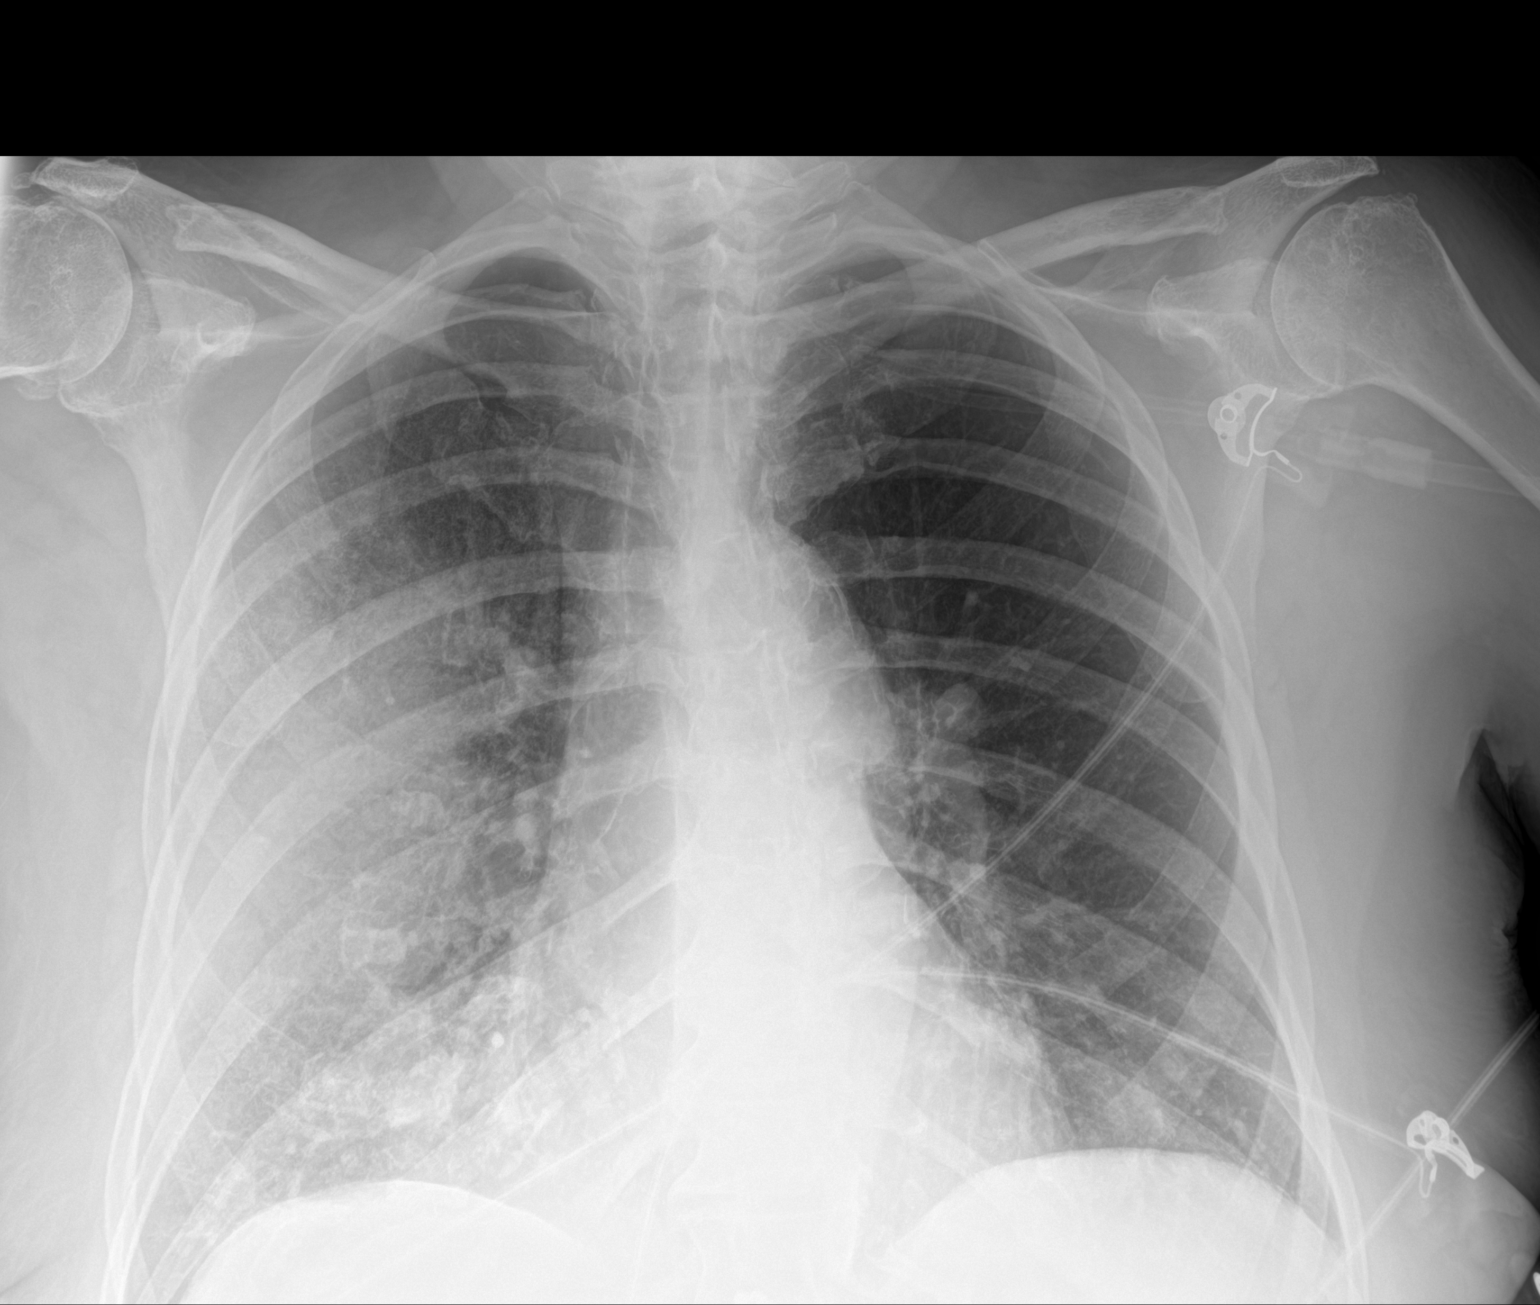

[2 of 2 positions shown; findings below may reference images not displayed]

FINDINGS: 2 frontal views of the chest demonstrate an unremarkable cardiac
silhouette. There is diffuse right-sided airspace disease greatest
in the mid and lower lung zones, likely involving the right middle
and right lower lobes. No large effusion or pneumothorax. No acute
bony abnormalities.
IMPRESSION: 1. Significant right-sided airspace disease consistent with right
middle and right lower lobe pneumonia. Followup PA and lateral chest
X-ray is recommended in 3-4 weeks following trial of antibiotic
therapy to ensure resolution and exclude underlying malignancy.

## 2021-10-26 ENCOUNTER — Other Ambulatory Visit: Payer: Self-pay | Admitting: Hematology & Oncology

## 2021-10-26 DIAGNOSIS — M25559 Pain in unspecified hip: Secondary | ICD-10-CM

## 2021-10-26 DIAGNOSIS — M545 Low back pain, unspecified: Secondary | ICD-10-CM

## 2021-11-18 ENCOUNTER — Telehealth: Payer: Self-pay

## 2021-11-18 ENCOUNTER — Other Ambulatory Visit: Payer: Self-pay

## 2021-11-18 ENCOUNTER — Inpatient Hospital Stay: Payer: PPO | Attending: Family

## 2021-11-18 DIAGNOSIS — K921 Melena: Secondary | ICD-10-CM | POA: Diagnosis not present

## 2021-11-18 DIAGNOSIS — D696 Thrombocytopenia, unspecified: Secondary | ICD-10-CM | POA: Diagnosis not present

## 2021-11-18 DIAGNOSIS — K7581 Nonalcoholic steatohepatitis (NASH): Secondary | ICD-10-CM | POA: Insufficient documentation

## 2021-11-18 DIAGNOSIS — F0390 Unspecified dementia without behavioral disturbance: Secondary | ICD-10-CM | POA: Diagnosis not present

## 2021-11-18 DIAGNOSIS — Z79899 Other long term (current) drug therapy: Secondary | ICD-10-CM | POA: Insufficient documentation

## 2021-11-18 DIAGNOSIS — R161 Splenomegaly, not elsewhere classified: Secondary | ICD-10-CM | POA: Diagnosis not present

## 2021-11-18 LAB — CMP (CANCER CENTER ONLY)
ALT: 10 U/L (ref 0–44)
AST: 17 U/L (ref 15–41)
Albumin: 4.5 g/dL (ref 3.5–5.0)
Alkaline Phosphatase: 52 U/L (ref 38–126)
Anion gap: 9 (ref 5–15)
BUN: 26 mg/dL — ABNORMAL HIGH (ref 8–23)
CO2: 33 mmol/L — ABNORMAL HIGH (ref 22–32)
Calcium: 9.9 mg/dL (ref 8.9–10.3)
Chloride: 97 mmol/L — ABNORMAL LOW (ref 98–111)
Creatinine: 0.82 mg/dL (ref 0.44–1.00)
GFR, Estimated: >60 mL/min (ref >60)
Glucose, Bld: 120 mg/dL — ABNORMAL HIGH (ref 70–99)
Potassium: 3.4 mmol/L — ABNORMAL LOW (ref 3.5–5.1)
Sodium: 139 mmol/L (ref 135–145)
Total Bilirubin: 0.8 mg/dL (ref 0.3–1.2)
Total Protein: 7.4 g/dL (ref 6.5–8.1)

## 2021-11-18 LAB — TYPE AND SCREEN
ABO/RH(D): A POS
Antibody Screen: NEGATIVE

## 2021-11-18 LAB — CBC WITH DIFFERENTIAL (CANCER CENTER ONLY)
Abs Immature Granulocytes: 0.12 10*3/uL — ABNORMAL HIGH (ref 0.00–0.07)
Basophils Absolute: 0.1 10*3/uL (ref 0.0–0.1)
Basophils Relative: 1 %
Eosinophils Absolute: 0 10*3/uL (ref 0.0–0.5)
Eosinophils Relative: 1 %
HCT: 31.2 % — ABNORMAL LOW (ref 36.0–46.0)
Hemoglobin: 10.3 g/dL — ABNORMAL LOW (ref 12.0–15.0)
Immature Granulocytes: 2 %
Lymphocytes Relative: 21 %
Lymphs Abs: 1.6 10*3/uL (ref 0.7–4.0)
MCH: 27 pg (ref 26.0–34.0)
MCHC: 33 g/dL (ref 30.0–36.0)
MCV: 81.9 fL (ref 80.0–100.0)
Monocytes Absolute: 0.8 10*3/uL (ref 0.1–1.0)
Monocytes Relative: 10 %
Neutro Abs: 5 10*3/uL (ref 1.7–7.7)
Neutrophils Relative %: 65 %
Platelet Count: 90 10*3/uL — ABNORMAL LOW (ref 150–400)
RBC: 3.81 MIL/uL — ABNORMAL LOW (ref 3.87–5.11)
RDW: 16.5 % — ABNORMAL HIGH (ref 11.5–15.5)
WBC Count: 7.6 10*3/uL (ref 4.0–10.5)
nRBC: 0 % (ref 0.0–0.2)

## 2021-11-18 LAB — RETICULOCYTES
Immature Retic Fract: 20.8 % — ABNORMAL HIGH (ref 2.3–15.9)
RBC.: 3.86 MIL/uL — ABNORMAL LOW (ref 3.87–5.11)
Retic Count, Absolute: 100.7 10*3/uL (ref 19.0–186.0)
Retic Ct Pct: 2.6 % (ref 0.4–3.1)

## 2021-11-18 NOTE — Progress Notes (Signed)
Pt called stating she hasnt had labs drawn in a few months to check her plts and is now having black stools. She declines any other bleeding or symptoms at this time. she will come in for labwork at 215 and wait for lab results.  ?

## 2021-11-18 NOTE — Telephone Encounter (Signed)
Patient left after lab work today, called her and went over labs that are currently back and informed her they looked stable and her plt count actually went up some. Informed pt her iron panel wouldn't be back until the morning and Dr.Ennever would also like to do a stool sample that when we call her tomorrow morning to review iron panel we would let her know if he wanted her to come in as well for the stool check. She verbalized understanding and will call if she notices any worsening symptoms or bleeding.  ?

## 2021-11-19 ENCOUNTER — Inpatient Hospital Stay: Payer: PPO

## 2021-11-19 ENCOUNTER — Inpatient Hospital Stay (HOSPITAL_BASED_OUTPATIENT_CLINIC_OR_DEPARTMENT_OTHER): Payer: PPO | Admitting: Hematology & Oncology

## 2021-11-19 ENCOUNTER — Telehealth: Payer: Self-pay | Admitting: *Deleted

## 2021-11-19 ENCOUNTER — Encounter: Payer: Self-pay | Admitting: Hematology & Oncology

## 2021-11-19 VITALS — BP 108/58 | HR 89 | Temp 98.2°F | Resp 18 | Ht 66.0 in | Wt 182.0 lb

## 2021-11-19 VITALS — BP 101/65 | HR 77

## 2021-11-19 DIAGNOSIS — D696 Thrombocytopenia, unspecified: Secondary | ICD-10-CM | POA: Diagnosis not present

## 2021-11-19 DIAGNOSIS — K7469 Other cirrhosis of liver: Secondary | ICD-10-CM

## 2021-11-19 DIAGNOSIS — D699 Hemorrhagic condition, unspecified: Secondary | ICD-10-CM | POA: Diagnosis not present

## 2021-11-19 LAB — IRON AND IRON BINDING CAPACITY (CC-WL,HP ONLY)
Iron: 95 ug/dL (ref 28–170)
Saturation Ratios: 19 % (ref 10.4–31.8)
TIBC: 508 ug/dL — ABNORMAL HIGH (ref 250–450)
UIBC: 413 ug/dL (ref 148–442)

## 2021-11-19 LAB — FERRITIN: Ferritin: 14 ng/mL (ref 11–307)

## 2021-11-19 MED ORDER — SODIUM CHLORIDE 0.9 % IV SOLN
200.0000 mg | Freq: Once | INTRAVENOUS | Status: AC
  Administered 2021-11-19: 200 mg via INTRAVENOUS
  Filled 2021-11-19: qty 10

## 2021-11-19 MED ORDER — SODIUM CHLORIDE 0.9 % IV SOLN: INTRAVENOUS | Status: DC

## 2021-11-19 NOTE — Progress Notes (Signed)
?Hematology and Oncology Follow Up Visit ? ?Rachael Buck ?[id number] ?11-Mar-1941 81 y.o. ?11/19/2021 ? ? ?Principle Diagnosis:  ?Thrombocytopenia -- NASH/Splenomegaly ; Immune based thrombocytopenia ?Iron deficiency  ?  ?Current Therapy:        ?Nplate as indicated  ?IV iron as indicated  -- Feraheme given on -06/12/2021 ?  ?Interim History:  Rachael Buck is here today for an unscheduled visit.  Unfortunate, she been having melena.  This been going on for couple days.  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  I am sure she probably has varices. ? ?She actually feels pretty good.  She has not had any diarrhea.  There is been no nausea or vomiting. ? ?She had a CT of the abdomen and pelvis back in 2021.  This showed some mild perigastric varices. ? ?She has always had some thrombocytopenia.  Her platelet count really has not been all that low.  I think her platelets are functional because under the microscope, on her blood smear, her platelets are fairly large. ? ?Her last splenic ultrasound was back in December.  Her spleen was mildly enlarged with a splenic volume of 800 cm?. ? ?She had iron studies done yesterday.  Her ferritin was only 14.  Her iron saturation was 19%.  As such, she is iron deficient.  We will give her a dose of IV iron. ? ?Follow-up.  She is trying to deal with her husband and his health issues.  He still having problems with vomiting. ? ?Of note, back in December, we did do a ultrasound of her spleen.  Thankfully, this showed that the splenic size was smaller.  The splenic volume was 800 cm?. ? ?She has had no fever.  Her appetite has been doing pretty well.  Unfortunately, her husband doing well.  He has dementia. ? ?Overall, her performance status is ECOG 1.   ? ?Medications:  ?Allergies as of 11/19/2021   ? ?   Reactions  ? Codeine Nausea And Vomiting  ? Metformin And Related Diarrhea  ? Shrimp [shellfish Allergy] Swelling  ? ?  ? ?  ?Medication List  ?  ? ?  ? Accurate as of November 19, 2021 12:44 PM. If you  have any questions, ask your nurse or doctor.  ?  ?  ? ?  ? ?acetaminophen 500 MG tablet ?Commonly known as: TYLENOL ?Take 500 mg by mouth every 6 (six) hours as needed for mild pain. ?  ?albuterol 108 (90 Base) MCG/ACT inhaler ?Commonly known as: ProAir HFA ?Inhale 2 puffs into the lungs every 4 (four) hours as needed for wheezing or shortness of breath. ?  ?ALPRAZolam 0.5 MG tablet ?Commonly known as: Duanne Moron ?TAKE 1 TABLET(0.5 MG) BY MOUTH THREE TIMES DAILY ?  ?blood glucose meter kit and supplies Kit ?Dispense based on patient and insurance preference. Use up to four times daily as directed. ?  ?colestipol 1 g tablet ?Commonly known as: COLESTID ?Take 1 g by mouth daily. ?  ?cyanocobalamin 1000 MCG tablet ?Take 1,000 mcg by mouth daily. ?  ?dicyclomine 20 MG tablet ?Commonly known as: BENTYL ?Take 1 tablet (20 mg total) by mouth 3 (three) times daily as needed. ?  ?diphenhydrAMINE 25 MG tablet ?Commonly known as: BENADRYL ?Take 25 mg by mouth every 6 (six) hours as needed (for allergies.). ?  ?empagliflozin 10 MG Tabs tablet ?Commonly known as: JARDIANCE ?Take 1 tablet (10 mg total) by mouth daily. ?  ?glipiZIDE 5 MG 24 hr tablet ?Commonly known as: GLUCOTROL XL ?  Take 1 tablet (5 mg total) by mouth daily with breakfast. ?  ?hydrochlorothiazide 25 MG tablet ?Commonly known as: HYDRODIURIL ?TAKE 1 TABLET(25 MG) BY MOUTH DAILY ?  ?hydrocortisone 2.5 % cream ?Apply 1 application topically daily as needed (itchy dry skin). ?  ?lisinopril 20 MG tablet ?Commonly known as: ZESTRIL ?Take 1 tablet (20 mg total) by mouth daily. ?  ?loperamide 2 MG capsule ?Commonly known as: IMODIUM ?Take 4-6 mg by mouth daily as needed (for IBS symptoms). ?  ?Melatonin 10 MG Tabs ?Take 10 mg by mouth at bedtime. ?  ?omeprazole 40 MG capsule ?Commonly known as: PRILOSEC ?TAKE 1 CAPSULE(40 MG) BY MOUTH DAILY ?  ?OneTouch Delica Lancets Fine Misc ?1 each by Other route daily. ?  ?OneTouch Ultra test strip ?Generic drug: glucose blood ?TEST  BLOOD SUGAR(S) ONCE DAILY IN THE MORNING ?  ?potassium chloride SA 20 MEQ tablet ?Commonly known as: [place]-Con M20 ?Take 1 tablet (20 mEq total) by mouth daily. ?  ?sertraline 100 MG tablet ?Commonly known as: ZOLOFT ?TAKE 1 TABLET(100 MG) BY MOUTH DAILY ?  ?simvastatin 80 MG tablet ?Commonly known as: ZOCOR ?TAKE 1 TABLET(80 MG) BY MOUTH DAILY ?  ?Stiolto Respimat 2.5-2.5 MCG/ACT Aers ?Generic drug: Tiotropium Bromide-Olodaterol ?Inhale 2 puffs into the lungs daily. ?  ?traMADol 50 MG tablet ?Commonly known as: ULTRAM ?TAKE 1 TABLET(50 MG) BY MOUTH EVERY 6 HOURS AS NEEDED ?  ? ?  ? ? ?Allergies:  ?Allergies  ?Allergen Reactions  ? Codeine Nausea And Vomiting  ? Metformin And Related Diarrhea  ? Shrimp [Shellfish Allergy] Swelling  ? ? ?Past Medical History, Surgical history, Social history, and Family History were reviewed and updated. ? ?Review of Systems: ?Review of Systems  ?Constitutional:  Positive for malaise/fatigue.  ?HENT: Negative.    ?Eyes: Negative.   ?Respiratory: Negative.    ?Cardiovascular: Negative.   ?Gastrointestinal:  Positive for abdominal pain.  ?Genitourinary: Negative.   ?Musculoskeletal: Negative.   ?Skin: Negative.   ?Neurological: Negative.   ?Endo/Heme/Allergies: Negative.   ?Psychiatric/Behavioral: Negative.    ? ? ?Physical Exam: ? height is _0  (1.676 m) and weight is 182 lb (82.6 kg). Her oral temperature is 98.2 ?F (36.8 ?C). Her blood pressure is 108/58 (abnormal) and her pulse is 89. Her respiration is 18 and oxygen saturation is 98%.  ? ?Wt Readings from Last 3 Encounters:  ?11/19/21 182 lb (82.6 kg)  ?10/12/21 182 lb (82.6 kg)  ?09/02/21 184 lb (83.5 kg)  ? ? ?Physical Exam ?Vitals reviewed.  ?HENT:  ?   Head: Normocephalic and atraumatic.  ?Eyes:  ?   Pupils: Pupils are equal, round, and reactive to light.  ?Cardiovascular:  ?   Rate and Rhythm: Normal rate and regular rhythm.  ?   Heart sounds: Normal heart sounds.  ?Pulmonary:  ?   Effort: Pulmonary effort is normal.  ?    Breath sounds: Normal breath sounds.  ?Abdominal:  ?   General: Bowel sounds are normal.  ?   Palpations: Abdomen is soft.  ?Musculoskeletal:     ?   General: No tenderness or deformity. Normal range of motion.  ?   Cervical back: Normal range of motion.  ?Lymphadenopathy:  ?   Cervical: No cervical adenopathy.  ?Skin: ?   General: Skin is warm and dry.  ?   Findings: No erythema or rash.  ?Neurological:  ?   Mental Status: She is alert and oriented to person, place, and time.  ?Psychiatric:     ?  Behavior: Behavior normal.     ?   Thought Content: Thought content normal.     ?   Judgment: Judgment normal.  ? ? ? ?Lab Results  ?Component Value Date  ? WBC 7.6 11/18/2021  ? HGB 10.3 (L) 11/18/2021  ? HCT 31.2 (L) 11/18/2021  ? MCV 81.9 11/18/2021  ? PLT 90 (L) 11/18/2021  ? ?Lab Results  ?Component Value Date  ? FERRITIN 14 11/18/2021  ? IRON 95 11/18/2021  ? TIBC 508 (H) 11/18/2021  ? UIBC 413 11/18/2021  ? IRONPCTSAT 19 11/18/2021  ? ?Lab Results  ?Component Value Date  ? RETICCTPCT 2.6 11/18/2021  ? RBC 3.86 (L) 11/18/2021  ? ?No results found for: KPAFRELGTCHN, LAMBDASER, KAPLAMBRATIO ?No results found for: IGGSERUM, IGA, IGMSERUM ?No results found for: TOTALPROTELP, ALBUMINELP, A1GS, A2GS, BETS, BETA2SER, GAMS, MSPIKE, SPEI ?  Chemistry   ?   ?Component Value Date/Time  ? NA 139 11/18/2021 1424  ? NA 127 (L) 02/14/2017 1335  ? K 3.4 (L) 11/18/2021 1424  ? K 3.6 02/14/2017 1335  ? CL 97 (L) 11/18/2021 1424  ? CL 92 (L) 02/14/2017 1335  ? CO2 33 (H) 11/18/2021 1424  ? CO2 28 02/14/2017 1335  ? BUN 26 (H) 11/18/2021 1424  ? BUN 7 (L) 02/14/2017 1335  ? CREATININE 0.82 11/18/2021 1424  ? CREATININE 0.47 (L) 02/14/2017 1335  ? CREATININE 0.56 07/24/2014 1204  ?    ?Component Value Date/Time  ? CALCIUM 9.9 11/18/2021 1424  ? CALCIUM 9.3 02/14/2017 1335  ? ALKPHOS 52 11/18/2021 1424  ? ALKPHOS 68 02/14/2017 1335  ? AST 17 11/18/2021 1424  ? ALT 10 11/18/2021 1424  ? BILITOT 0.8 11/18/2021 1424  ?  ? ? ?Impression and  Plan: Ms. 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   ? ?The problem right now is this GI bleeding.  It certainly is not all that great.  H

## 2021-11-19 NOTE — Patient Instructions (Signed)

## 2021-11-19 NOTE — Telephone Encounter (Signed)
Patient notified per order of Dr. Marin Olp that he would like her to come in for an office visit and for a dose of IV iron.  Pt states that she can be here by noon.  Dr. Marin Olp notified and message sent to scheduling.  ? ?

## 2021-11-23 ENCOUNTER — Other Ambulatory Visit (HOSPITAL_BASED_OUTPATIENT_CLINIC_OR_DEPARTMENT_OTHER): Payer: Self-pay | Admitting: Gastroenterology

## 2021-11-23 ENCOUNTER — Other Ambulatory Visit: Payer: Self-pay | Admitting: Gastroenterology

## 2021-11-23 DIAGNOSIS — K921 Melena: Secondary | ICD-10-CM | POA: Diagnosis not present

## 2021-11-23 DIAGNOSIS — R109 Unspecified abdominal pain: Secondary | ICD-10-CM

## 2021-11-23 DIAGNOSIS — D649 Anemia, unspecified: Secondary | ICD-10-CM

## 2021-11-23 DIAGNOSIS — K7581 Nonalcoholic steatohepatitis (NASH): Secondary | ICD-10-CM

## 2021-11-25 DIAGNOSIS — Z7984 Long term (current) use of oral hypoglycemic drugs: Secondary | ICD-10-CM | POA: Diagnosis not present

## 2021-11-25 DIAGNOSIS — Z7951 Long term (current) use of inhaled steroids: Secondary | ICD-10-CM | POA: Diagnosis not present

## 2021-11-25 DIAGNOSIS — K7460 Unspecified cirrhosis of liver: Secondary | ICD-10-CM | POA: Diagnosis not present

## 2021-11-25 DIAGNOSIS — E119 Type 2 diabetes mellitus without complications: Secondary | ICD-10-CM | POA: Diagnosis not present

## 2021-11-25 DIAGNOSIS — I10 Essential (primary) hypertension: Secondary | ICD-10-CM | POA: Diagnosis not present

## 2021-11-25 DIAGNOSIS — J449 Chronic obstructive pulmonary disease, unspecified: Secondary | ICD-10-CM | POA: Diagnosis not present

## 2021-11-27 ENCOUNTER — Ambulatory Visit (HOSPITAL_BASED_OUTPATIENT_CLINIC_OR_DEPARTMENT_OTHER)
Admission: RE | Admit: 2021-11-27 | Discharge: 2021-11-27 | Disposition: A | Discharge status: Home / Self Care | Payer: PPO | Source: Ambulatory Visit | Attending: Gastroenterology | Admitting: Gastroenterology

## 2021-11-27 ENCOUNTER — Encounter (HOSPITAL_BASED_OUTPATIENT_CLINIC_OR_DEPARTMENT_OTHER): Payer: Self-pay

## 2021-11-27 DIAGNOSIS — R109 Unspecified abdominal pain: Secondary | ICD-10-CM | POA: Insufficient documentation

## 2021-11-27 DIAGNOSIS — K7581 Nonalcoholic steatohepatitis (NASH): Secondary | ICD-10-CM | POA: Insufficient documentation

## 2021-11-27 DIAGNOSIS — D649 Anemia, unspecified: Secondary | ICD-10-CM | POA: Insufficient documentation

## 2021-11-27 DIAGNOSIS — K5730 Diverticulosis of large intestine without perforation or abscess without bleeding: Secondary | ICD-10-CM | POA: Diagnosis not present

## 2021-11-27 DIAGNOSIS — K7689 Other specified diseases of liver: Secondary | ICD-10-CM | POA: Diagnosis not present

## 2021-11-27 DIAGNOSIS — K7469 Other cirrhosis of liver: Secondary | ICD-10-CM | POA: Diagnosis not present

## 2021-11-27 DIAGNOSIS — K766 Portal hypertension: Secondary | ICD-10-CM | POA: Diagnosis not present

## 2021-11-27 MED ORDER — IOHEXOL 300 MG/ML  SOLN
100.0000 mL | Freq: Once | INTRAMUSCULAR | Status: AC | PRN
  Administered 2021-11-27: 100 mL via INTRAVENOUS

## 2021-12-09 ENCOUNTER — Inpatient Hospital Stay: Payer: PPO | Attending: Family

## 2021-12-09 ENCOUNTER — Inpatient Hospital Stay: Payer: PPO | Admitting: Hematology & Oncology

## 2021-12-09 ENCOUNTER — Encounter: Payer: Self-pay | Admitting: Hematology & Oncology

## 2021-12-09 ENCOUNTER — Other Ambulatory Visit: Payer: Self-pay

## 2021-12-09 VITALS — BP 119/54 | HR 83 | Temp 98.9°F | Resp 18 | Ht 66.0 in | Wt 186.0 lb

## 2021-12-09 DIAGNOSIS — R161 Splenomegaly, not elsewhere classified: Secondary | ICD-10-CM | POA: Insufficient documentation

## 2021-12-09 DIAGNOSIS — K7581 Nonalcoholic steatohepatitis (NASH): Secondary | ICD-10-CM | POA: Insufficient documentation

## 2021-12-09 DIAGNOSIS — D696 Thrombocytopenia, unspecified: Secondary | ICD-10-CM | POA: Diagnosis not present

## 2021-12-09 DIAGNOSIS — Z79899 Other long term (current) drug therapy: Secondary | ICD-10-CM | POA: Insufficient documentation

## 2021-12-09 LAB — CMP (CANCER CENTER ONLY)
ALT: 10 U/L (ref 0–44)
AST: 17 U/L (ref 15–41)
Albumin: 4.3 g/dL (ref 3.5–5.0)
Alkaline Phosphatase: 45 U/L (ref 38–126)
Anion gap: 8 (ref 5–15)
BUN: 21 mg/dL (ref 8–23)
CO2: 31 mmol/L (ref 22–32)
Calcium: 9.4 mg/dL (ref 8.9–10.3)
Chloride: 98 mmol/L (ref 98–111)
Creatinine: 0.82 mg/dL (ref 0.44–1.00)
GFR, Estimated: >60 mL/min (ref >60)
Glucose, Bld: 131 mg/dL — ABNORMAL HIGH (ref 70–99)
Potassium: 3.4 mmol/L — ABNORMAL LOW (ref 3.5–5.1)
Sodium: 137 mmol/L (ref 135–145)
Total Bilirubin: 0.7 mg/dL (ref 0.3–1.2)
Total Protein: 6.9 g/dL (ref 6.5–8.1)

## 2021-12-09 LAB — CBC WITH DIFFERENTIAL (CANCER CENTER ONLY)
Abs Immature Granulocytes: 0.02 10*3/uL (ref 0.00–0.07)
Basophils Absolute: 0.1 10*3/uL (ref 0.0–0.1)
Basophils Relative: 1 %
Eosinophils Absolute: 0 10*3/uL (ref 0.0–0.5)
Eosinophils Relative: 1 %
HCT: 31.1 % — ABNORMAL LOW (ref 36.0–46.0)
Hemoglobin: 10.2 g/dL — ABNORMAL LOW (ref 12.0–15.0)
Immature Granulocytes: 0 %
Lymphocytes Relative: 24 %
Lymphs Abs: 1.3 10*3/uL (ref 0.7–4.0)
MCH: 27.3 pg (ref 26.0–34.0)
MCHC: 32.8 g/dL (ref 30.0–36.0)
MCV: 83.4 fL (ref 80.0–100.0)
Monocytes Absolute: 0.5 10*3/uL (ref 0.1–1.0)
Monocytes Relative: 10 %
Neutro Abs: 3.6 10*3/uL (ref 1.7–7.7)
Neutrophils Relative %: 64 %
Platelet Count: 68 10*3/uL — ABNORMAL LOW (ref 150–400)
RBC: 3.73 MIL/uL — ABNORMAL LOW (ref 3.87–5.11)
RDW: 17.2 % — ABNORMAL HIGH (ref 11.5–15.5)
WBC Count: 5.6 10*3/uL (ref 4.0–10.5)
nRBC: 0 % (ref 0.0–0.2)

## 2021-12-09 LAB — RETICULOCYTES
Immature Retic Fract: 16.6 % — ABNORMAL HIGH (ref 2.3–15.9)
RBC.: 3.71 MIL/uL — ABNORMAL LOW (ref 3.87–5.11)
Retic Count, Absolute: 80.9 10*3/uL (ref 19.0–186.0)
Retic Ct Pct: 2.2 % (ref 0.4–3.1)

## 2021-12-09 NOTE — Progress Notes (Signed)
?Hematology and Oncology Follow Up Visit ? ?Rachael Buck ?[id number] ?23-Sep-1940 81 y.o. ?12/09/2021 ? ? ?Principle Diagnosis:  ?Thrombocytopenia -- NASH/Splenomegaly ; Immune based thrombocytopenia ?Iron deficiency  ?  ?Current Therapy:        ?Nplate as indicated  ?IV iron as indicated  -- Feraheme given on -06/12/2021 ?  ?Interim History:  Rachael Buck is here today for follow-up.  She did have more problems in the left upper quadrant.  She does have splenomegaly.  This is from her cirrhosis.  Unfortunately, I am not sure what all we can do about this. ? ?She did have a CT scan done the end of March.  This did show splenomegaly.  The report showed that the splenic size was 16.5 cm.  I am unsure how that equates with an ultrasound.  Her Rachael Buck had to get another ultrasound. ? ?She has had no bleeding which is nice.  There has been no obvious change in bowel or bladder habits. ? ?She does have back issues.  I just feel bad if she has the back issues. ? ?She has had no cough or shortness of breath. ? ?There is been a little bit of leg swelling.  She has had no rashes. ? ?Overall, I would say her performance status is probably ECOG 2.   ? ?Medications:  ?Allergies as of 12/09/2021   ? ?   Reactions  ? Codeine Nausea And Vomiting  ? Metformin And Related Diarrhea  ? Shrimp [shellfish Allergy] Swelling  ? ?  ? ?  ?Medication List  ?  ? ?  ? Accurate as of December 09, 2021  2:22 PM. If you have any questions, ask your nurse or doctor.  ?  ?  ? ?  ? ?acetaminophen 500 MG tablet ?Commonly known as: TYLENOL ?Take 500 mg by mouth every 6 (six) hours as needed for mild pain. ?  ?albuterol 108 (90 Base) MCG/ACT inhaler ?Commonly known as: ProAir HFA ?Inhale 2 puffs into the lungs every 4 (four) hours as needed for wheezing or shortness of breath. ?  ?ALPRAZolam 0.5 MG tablet ?Commonly known as: Rachael Buck ?TAKE 1 TABLET(0.5 MG) BY MOUTH THREE TIMES DAILY ?  ?blood glucose meter kit and supplies Kit ?Dispense based on patient and  insurance preference. Use up to four times daily as directed. ?  ?colestipol 1 g tablet ?Commonly known as: COLESTID ?Take 1 g by mouth daily. ?  ?cyanocobalamin 1000 MCG tablet ?Take 1,000 mcg by mouth daily. ?  ?dicyclomine 20 MG tablet ?Commonly known as: BENTYL ?Take 1 tablet (20 mg total) by mouth 3 (three) times daily as needed. ?  ?diphenhydrAMINE 25 MG tablet ?Commonly known as: BENADRYL ?Take 25 mg by mouth every 6 (six) hours as needed (for allergies.). ?  ?empagliflozin 10 MG Tabs tablet ?Commonly known as: JARDIANCE ?Take 1 tablet (10 mg total) by mouth daily. ?  ?glipiZIDE 5 MG 24 hr tablet ?Commonly known as: GLUCOTROL XL ?Take 1 tablet (5 mg total) by mouth daily with breakfast. ?  ?hydrochlorothiazide 25 MG tablet ?Commonly known as: HYDRODIURIL ?TAKE 1 TABLET(25 MG) BY MOUTH DAILY ?  ?hydrocortisone 2.5 % cream ?Apply 1 application topically daily as needed (itchy dry skin). ?  ?lisinopril 20 MG tablet ?Commonly known as: ZESTRIL ?Take 1 tablet (20 mg total) by mouth daily. ?  ?loperamide 2 MG capsule ?Commonly known as: IMODIUM ?Take 4-6 mg by mouth daily as needed (for IBS symptoms). ?  ?Melatonin 10 MG Tabs ?Take 10 mg  by mouth at bedtime. ?  ?omeprazole 40 MG capsule ?Commonly known as: PRILOSEC ?TAKE 1 CAPSULE(40 MG) BY MOUTH DAILY ?  ?OneTouch Delica Lancets Fine Misc ?1 each by Other route daily. ?  ?OneTouch Ultra test strip ?Generic drug: glucose blood ?TEST BLOOD SUGAR(S) ONCE DAILY IN THE MORNING ?  ?potassium chloride SA 20 MEQ tablet ?Commonly known as: Klor-Con M20 ?Take 1 tablet (20 mEq total) by mouth daily. ?  ?sertraline 100 MG tablet ?Commonly known as: ZOLOFT ?TAKE 1 TABLET(100 MG) BY MOUTH DAILY ?  ?simvastatin 80 MG tablet ?Commonly known as: ZOCOR ?TAKE 1 TABLET(80 MG) BY MOUTH DAILY ?  ?Stiolto Respimat 2.5-2.5 MCG/ACT Aers ?Generic drug: Tiotropium Bromide-Olodaterol ?Inhale 2 puffs into the lungs daily. ?  ?traMADol 50 MG tablet ?Commonly known as: ULTRAM ?TAKE 1 TABLET(50  MG) BY MOUTH EVERY 6 HOURS AS NEEDED ?  ? ?  ? ? ?Allergies:  ?Allergies  ?Allergen Reactions  ? Codeine Nausea And Vomiting  ? Metformin And Related Diarrhea  ? Shrimp [Shellfish Allergy] Swelling  ? ? ?Past Medical History, Surgical history, Social history, and Family History were reviewed and updated. ? ?Review of Systems: ?Review of Systems  ?Constitutional:  Positive for malaise/fatigue.  ?HENT: Negative.    ?Eyes: Negative.   ?Respiratory: Negative.    ?Cardiovascular: Negative.   ?Gastrointestinal:  Positive for abdominal pain.  ?Genitourinary: Negative.   ?Musculoskeletal: Negative.   ?Skin: Negative.   ?Neurological: Negative.   ?Endo/Heme/Allergies: Negative.   ?Psychiatric/Behavioral: Negative.    ? ? ?Physical Exam: ? height is 5' 6"  (1.676 m) and weight is 186 lb (84.4 kg). Her oral temperature is 98.9 ?F (37.2 ?C). Her blood pressure is 119/54 (abnormal) and her pulse is 83. Her respiration is 18 and oxygen saturation is 98%.  ? ?Wt Readings from Last 3 Encounters:  ?12/09/21 186 lb (84.4 kg)  ?11/19/21 182 lb (82.6 kg)  ?10/12/21 182 lb (82.6 kg)  ? ? ?Physical Exam ?Vitals reviewed.  ?HENT:  ?   Head: Normocephalic and atraumatic.  ?Eyes:  ?   Pupils: Pupils are equal, round, and reactive to light.  ?Cardiovascular:  ?   Rate and Rhythm: Normal rate and regular rhythm.  ?   Heart sounds: Normal heart sounds.  ?Pulmonary:  ?   Effort: Pulmonary effort is normal.  ?   Breath sounds: Normal breath sounds.  ?Abdominal:  ?   General: Bowel sounds are normal.  ?   Palpations: Abdomen is soft.  ?Musculoskeletal:     ?   General: No tenderness or deformity. Normal range of motion.  ?   Cervical back: Normal range of motion.  ?Lymphadenopathy:  ?   Cervical: No cervical adenopathy.  ?Skin: ?   General: Skin is warm and dry.  ?   Findings: No erythema or rash.  ?Neurological:  ?   Mental Status: She is alert and oriented to person, place, and time.  ?Psychiatric:     ?   Behavior: Behavior normal.     ?    Thought Content: Thought content normal.     ?   Judgment: Judgment normal.  ? ? ? ?Lab Results  ?Component Value Date  ? WBC 5.6 12/09/2021  ? HGB 10.2 (L) 12/09/2021  ? HCT 31.1 (L) 12/09/2021  ? MCV 83.4 12/09/2021  ? PLT 68 (L) 12/09/2021  ? ?Lab Results  ?Component Value Date  ? FERRITIN 14 11/18/2021  ? IRON 95 11/18/2021  ? TIBC 508 (H) 11/18/2021  ?  UIBC 413 11/18/2021  ? IRONPCTSAT 19 11/18/2021  ? ?Lab Results  ?Component Value Date  ? RETICCTPCT 2.2 12/09/2021  ? RBC 3.71 (L) 12/09/2021  ? ?No results found for: KPAFRELGTCHN, LAMBDASER, KAPLAMBRATIO ?No results found for: IGGSERUM, IGA, IGMSERUM ?No results found for: TOTALPROTELP, ALBUMINELP, A1GS, A2GS, BETS, BETA2SER, GAMS, MSPIKE, SPEI ?  Chemistry   ?   ?Component Value Date/Time  ? NA 137 12/09/2021 1325  ? NA 127 (L) 02/14/2017 1335  ? K 3.4 (L) 12/09/2021 1325  ? K 3.6 02/14/2017 1335  ? CL 98 12/09/2021 1325  ? CL 92 (L) 02/14/2017 1335  ? CO2 31 12/09/2021 1325  ? CO2 28 02/14/2017 1335  ? BUN 21 12/09/2021 1325  ? BUN 7 (L) 02/14/2017 1335  ? CREATININE 0.82 12/09/2021 1325  ? CREATININE 0.47 (L) 02/14/2017 1335  ? CREATININE 0.56 07/24/2014 1204  ?    ?Component Value Date/Time  ? CALCIUM 9.4 12/09/2021 1325  ? CALCIUM 9.3 02/14/2017 1335  ? ALKPHOS 45 12/09/2021 1325  ? ALKPHOS 68 02/14/2017 1335  ? AST 17 12/09/2021 1325  ? ALT 10 12/09/2021 1325  ? BILITOT 0.7 12/09/2021 1325  ?  ? ? ?Impression and Plan: Ms. 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   ? ?Again, I feel bad about the splenomegaly.  We will do another ultrasound to see what the size is.  The last ultrasound was done in December.  We will get some engage. ? ?I just wonder if there is not a role for Radiation Therapy for the splenomegaly given that it is painful. ? 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 ? ?Her platelet count is down which I do think is probably from her  splenomegaly. ? ?We will see what her iron studies look like.  Her blood count is holding pretty stable. ? ?We will have to get her back to see [place] in another month or so. ? ? ? ?[name], MD ?4/12/2

## 2021-12-10 ENCOUNTER — Telehealth: Payer: Self-pay

## 2021-12-10 LAB — IRON AND IRON BINDING CAPACITY (CC-WL,HP ONLY)
Iron: 44 ug/dL (ref 28–170)
Saturation Ratios: 9 % — ABNORMAL LOW (ref 10.4–31.8)
TIBC: 494 ug/dL — ABNORMAL HIGH (ref 250–450)
UIBC: 450 ug/dL — ABNORMAL HIGH (ref 148–442)

## 2021-12-10 LAB — FERRITIN: Ferritin: 25 ng/mL (ref 11–307)

## 2021-12-10 NOTE — Telephone Encounter (Signed)
-----   Message from [name], MD sent at 12/10/2021 11:58 AM EDT ----- ?Call let her know that the iron is quite low.  She does need some IV iron.  Please set this up.  Thanks.  Pete ?

## 2021-12-10 NOTE — Telephone Encounter (Signed)
Advised via MyChart.

## 2021-12-15 ENCOUNTER — Inpatient Hospital Stay: Payer: PPO

## 2021-12-15 VITALS — BP 93/63 | HR 70 | Temp 98.3°F | Resp 18

## 2021-12-15 DIAGNOSIS — D696 Thrombocytopenia, unspecified: Secondary | ICD-10-CM

## 2021-12-15 MED ORDER — SODIUM CHLORIDE 0.9 % IV SOLN: INTRAVENOUS | Status: DC

## 2021-12-15 MED ORDER — SODIUM CHLORIDE 0.9 % IV SOLN
200.0000 mg | Freq: Once | INTRAVENOUS | Status: AC
  Administered 2021-12-15: 200 mg via INTRAVENOUS
  Filled 2021-12-15: qty 200

## 2021-12-15 NOTE — Progress Notes (Signed)
Pt refused to stay for 30 minutes post iron infusion.  Pt without complaints at time of discharge. ?

## 2021-12-29 ENCOUNTER — Ambulatory Visit (INDEPENDENT_AMBULATORY_CARE_PROVIDER_SITE_OTHER): Payer: PPO

## 2021-12-29 DIAGNOSIS — Z0000 Encounter for general adult medical examination without abnormal findings: Secondary | ICD-10-CM

## 2021-12-29 NOTE — Progress Notes (Signed)
Virtual Visit via Telephone Note ? ?I connected with  Rachael Buck on 12/29/21 at  1:45 PM EDT by telephone and verified that I am speaking with the correct person using two identifiers. ? ?Medicare Annual Wellness visit completed telephonically due to Covid-19 pandemic.  ? ?Persons participating in this call: This Health Coach and this patient.  ? ?Location: ?Patient: home ?Provider: office ?  ?I discussed the limitations, risks, security and privacy concerns of performing an evaluation and management service by telephone and the availability of in person appointments. The patient expressed understanding and agreed to proceed. ? ?Unable to perform video visit due to video visit attempted and failed and/or patient does not have video capability.  ? ?Some vital signs may be absent or patient reported.  ? ?Willette Brace, LPN ? ? ?Subjective:  ? Rachael Buck is a 81 y.o. female who presents for Medicare Annual (Subsequent) preventive examination. ? ?Review of Systems    ? ?Cardiac Risk Factors include: advanced age (>42mn, >>26women);dyslipidemia;hypertension;diabetes mellitus;obesity (BMI >30kg/m2) ? ?   ?Objective:  ?  ?There were no vitals filed for this visit. ?There is no height or weight on file to calculate BMI. ? ? ?  12/29/2021  ?  1:55 PM 11/19/2021  ? 12:28 PM 09/02/2021  ? 10:53 AM 08/17/2021  ?  3:02 PM 07/21/2021  ?  9:35 AM 06/08/2021  ?  9:22 AM 04/29/2021  ? 10:04 AM  ?Advanced Directives  ?Does Patient Have a Medical Advance Directive? Yes Yes Yes Yes Yes Yes No  ?Type of Advance Directive Healthcare Power of Attorney Living will;Healthcare Power of Attorney Living will  Living will Living will   ?Does patient want to make changes to medical advance directive?  No - Patient declined No - Patient declined No - Patient declined No - Patient declined No - Patient declined   ?Copy of [place]? No - copy requested No - copy requested       ?Would patient like  information on creating a medical advance directive?  No - Patient declined       ? ? ?Current Medications (verified) ?Outpatient Encounter Medications as of 12/29/2021  ?Medication Sig  ? acetaminophen (TYLENOL) 500 MG tablet Take 500 mg by mouth every 6 (six) hours as needed for mild pain.  ? albuterol (PROAIR HFA) 108 (90 Base) MCG/ACT inhaler Inhale 2 puffs into the lungs every 4 (four) hours as needed for wheezing or shortness of breath.  ? ALPRAZolam (XANAX) 0.5 MG tablet TAKE 1 TABLET(0.5 MG) BY MOUTH THREE TIMES DAILY  ? blood glucose meter kit and supplies KIT Dispense based on patient and insurance preference. Use up to four times daily as directed.  ? diphenhydrAMINE (BENADRYL) 25 MG tablet Take 25 mg by mouth every 6 (six) hours as needed (for allergies.).  ? empagliflozin (JARDIANCE) 10 MG TABS tablet Take 1 tablet (10 mg total) by mouth daily.  ? glipiZIDE (GLUCOTROL XL) 5 MG 24 hr tablet Take 1 tablet (5 mg total) by mouth daily with breakfast.  ? hydrochlorothiazide (HYDRODIURIL) 25 MG tablet TAKE 1 TABLET(25 MG) BY MOUTH DAILY  ? hydrocortisone 2.5 % cream Apply 1 application topically daily as needed (itchy dry skin).  ? lisinopril (ZESTRIL) 20 MG tablet Take 1 tablet (20 mg total) by mouth daily.  ? loperamide (IMODIUM) 2 MG capsule Take 4-6 mg by mouth daily as needed (for IBS symptoms).  ? Melatonin 10 MG TABS Take 10  mg by mouth at bedtime.  ? ONETOUCH DELICA LANCETS FINE MISC 1 each by Other route daily.  ? ONETOUCH ULTRA test strip TEST BLOOD SUGAR(S) ONCE DAILY IN THE MORNING  ? sertraline (ZOLOFT) 100 MG tablet TAKE 1 TABLET(100 MG) BY MOUTH DAILY  ? simvastatin (ZOCOR) 80 MG tablet TAKE 1 TABLET(80 MG) BY MOUTH DAILY  ? Tiotropium Bromide-Olodaterol (STIOLTO RESPIMAT) 2.5-2.5 MCG/ACT AERS Inhale 2 puffs into the lungs daily.  ? traMADol (ULTRAM) 50 MG tablet TAKE 1 TABLET(50 MG) BY MOUTH EVERY 6 HOURS AS NEEDED  ? [DISCONTINUED] colestipol (COLESTID) 1 g tablet Take 1 g by mouth daily.  ?  [DISCONTINUED] cyanocobalamin 1000 MCG tablet Take 1,000 mcg by mouth daily.  ? [DISCONTINUED] dicyclomine (BENTYL) 20 MG tablet Take 1 tablet (20 mg total) by mouth 3 (three) times daily as needed.  ? [DISCONTINUED] omeprazole (PRILOSEC) 40 MG capsule TAKE 1 CAPSULE(40 MG) BY MOUTH DAILY  ? [DISCONTINUED] potassium chloride SA (KLOR-CON M20) 20 MEQ tablet Take 1 tablet (20 mEq total) by mouth daily.  ? ?No facility-administered encounter medications on file as of 12/29/2021.  ? ? ?Allergies (verified) ?Codeine, Metformin and related, and Shrimp [shellfish allergy]  ? ?History: ?Past Medical History:  ?Diagnosis Date  ? Anxiety   ? Arthritis   ? Asthma   ? Cirrhosis, nonalcoholic ([place])   ? Colon polyps   ? Diabetes mellitus   ? Diverticulitis   ? GERD (gastroesophageal reflux disease)   ? Hyperlipidemia   ? Hypertension   ? NASH (nonalcoholic steatohepatitis)   ? Obesity   ? Pneumonia   ? ?Past Surgical History:  ?Procedure Laterality Date  ? CHOLECYSTECTOMY  1981  ? ENDOVENOUS ABLATION SAPHENOUS VEIN W/ LASER Left 02/21/2018  ? endovenous laser ablation left greater saphenous vein by Tinnie Gens MD   ? [place]  ? PARTIAL HYSTERECTOMY  1972  ? ROTATOR CUFF REPAIR    ? bilateral. 2006, 2008  ? 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 Procedure: LEFT TOTAL KNEE ARTHROPLASTY;  Surgeon: Netta Cedars, MD;  Location: [facility];  Service: Orthopedics;  Laterality: Left;  ? VEIN SURGERY    ? ?Family History  ?Problem Relation Age of Onset  ? Stomach cancer Mother   ? Diabetes Sister   ? Breast cancer Daughter 48  ? Breast cancer Maternal Aunt   ? Diabetes Brother   ? Cancer Other   ? GER disease Other   ? Obesity Other   ? ?Social History  ? ?Socioeconomic History  ? Marital status: Married  ?  Spouse name: Not on file  ? Number of children: Not on file  ? Years of education: Not on file  ? Highest education level: Not on file  ?Occupational History  ? Not on file  ?Tobacco  Use  ? Smoking status: Former  ?  Packs/day: 1.50  ?  Years: 15.00  ?  Pack years: 22.50  ?  Types: Cigarettes  ? Smokeless tobacco: Never  ? Tobacco comments:  ?  Quit 2002.  ?Vaping Use  ? Vaping Use: Never used  ?Substance and Sexual Activity  ? Alcohol use: No  ? Drug use: No  ? Sexual activity: Not on file  ?Other Topics Concern  ? Not on file  ?Social History Narrative  ? Not on file  ? ?Social Determinants of Health  ? ?Financial Resource Strain: Low Risk   ? Difficulty of Paying Living Expenses: Not hard  at all  ?Food Insecurity: No Food Insecurity  ? Worried About Charity fundraiser in the Last Year: Never true  ? Ran Out of Food in the Last Year: Never true  ?Transportation Needs: No Transportation Needs  ? Lack of Transportation (Medical): No  ? Lack of Transportation (Non-Medical): No  ?Physical Activity: Inactive  ? Days of Exercise per Week: 0 days  ? Minutes of Exercise per Session: 0 min  ?Stress: No Stress Concern Present  ? Feeling of Stress : Not at all  ?Social Connections: Moderately Integrated  ? Frequency of Communication with Friends and Family: More than three times a week  ? Frequency of Social Gatherings with Friends and Family: Once a week  ? Attends Religious Services: More than 4 times per year  ? Active Member of Clubs or Organizations: No  ? Attends Archivist Meetings: Never  ? Marital Status: Married  ? ? ?Tobacco Counseling ?Counseling given: Not Answered ?Tobacco comments: Quit 2002. ? ? ?Clinical Intake: ? ?Pre-visit preparation completed: Yes ? ?Pain : No/denies pain ? ?  ? ?BMI - recorded: 30.04 ?Nutritional Risks: None ?Diabetes: Yes ?CBG done?: No ?Did pt. bring in CBG monitor from home?: No ? ?How often do you need to have someone help you when you read instructions, pamphlets, or other written materials from your doctor or pharmacy?: 1 - Never ? ?Diabetic?Nutrition Risk Assessment: ? ?Has the patient had any N/V/D within the last 2 months?  No  ?Does the patient  have any non-healing wounds?  No  ?Has the patient had any unintentional weight loss or weight gain?  No  ? ?Diabetes: ? ?Is the patient diabetic?  Yes  ?If diabetic, was a CBG obtained today?  No  ?Did the pati

## 2021-12-29 NOTE — Patient Instructions (Signed)
Rachael Buck , ?Thank you for taking time to come for your Medicare Wellness Visit. I appreciate your ongoing commitment to your health goals. Please review the following plan we discussed and let me know if I can assist you in the future.  ? ?Screening recommendations/referrals: ?Colonoscopy: Pt stated she will follow up at a later date  ?Mammogram: Done 05/11/21 repeat every year  ?Bone Density: Done 12/07/16 repeat every 2 years  ?Recommended yearly ophthalmology/optometry visit for glaucoma screening and checkup ?Recommended yearly dental visit for hygiene and checkup ? ?Vaccinations: ?Influenza vaccine: Done 06/17/21 repeat every year  ?Pneumococcal vaccine: Up to date ?Tdap vaccine: Due  ?Shingles vaccine: Shingrix discussed. Please contact your pharmacy for coverage information.    ?Covid-19:Completed 1/12, 2/9, 05/30/20 ? ?Advanced directives: Please bring a copy of your health care power of attorney and living will to the office at your convenience. ? ?Conditions/risks identified: none at this time  ? ?Next appointment: Follow up in one year for your annual wellness visit ? ? ?Preventive Care 80 Years and Older, Female ?Preventive care refers to lifestyle choices and visits with your health care provider that can promote health and wellness. ?What does preventive care include? ?A yearly physical exam. This is also called an annual well check. ?Dental exams once or twice a year. ?Routine eye exams. Ask your health care provider how often you should have your eyes checked. ?Personal lifestyle choices, including: ?Daily care of your teeth and gums. ?Regular physical activity. ?Eating a healthy diet. ?Avoiding tobacco and drug use. ?Limiting alcohol use. ?Practicing safe sex. ?Taking low-dose aspirin every day. ?Taking vitamin and mineral supplements as recommended by your health care provider. ?What happens during an annual well check? ?The services and screenings done by your health care provider during your  annual well check will depend on your age, overall health, lifestyle risk factors, and family history of disease. ?Counseling  ?Your health care provider may ask you questions about your: ?Alcohol use. ?Tobacco use. ?Drug use. ?Emotional well-being. ?Home and relationship well-being. ?Sexual activity. ?Eating habits. ?History of falls. ?Memory and ability to understand (cognition). ?Work and work Statistician. ?Reproductive health. ?Screening  ?You may have the following tests or measurements: ?Height, weight, and BMI. ?Blood pressure. ?Lipid and cholesterol levels. These may be checked every 5 years, or more frequently if you are over 16 years old. ?Skin check. ?Lung cancer screening. You may have this screening every year starting at age 52 if you have a 30-pack-year history of smoking and currently smoke or have quit within the past 15 years. ?Fecal occult blood test (FOBT) of the stool. You may have this test every year starting at age 66. ?Flexible sigmoidoscopy or colonoscopy. You may have a sigmoidoscopy every 5 years or a colonoscopy every 10 years starting at age 52. ?Hepatitis C blood test. ?Hepatitis B blood test. ?Sexually transmitted disease (STD) testing. ?Diabetes screening. This is done by checking your blood sugar (glucose) after you have not eaten for a while (fasting). You may have this done every 1-3 years. ?Bone density scan. This is done to screen for osteoporosis. You may have this done starting at age 20. ?Mammogram. This may be done every 1-2 years. Talk to your health care provider about how often you should have regular mammograms. ?Talk with your health care provider about your test results, treatment options, and if necessary, the need for more tests. ?Vaccines  ?Your health care provider may recommend certain vaccines, such as: ?Influenza vaccine. This is recommended  every year. ?Tetanus, diphtheria, and acellular pertussis (Tdap, Td) vaccine. You may need a Td booster every 10  years. ?Zoster vaccine. You may need this after age 49. ?Pneumococcal 13-valent conjugate (PCV13) vaccine. One dose is recommended after age 68. ?Pneumococcal polysaccharide (PPSV23) vaccine. One dose is recommended after age 65. ?Talk to your health care provider about which screenings and vaccines you need and how often you need them. ?This information is not intended to replace advice given to you by your health care provider. Make sure you discuss any questions you have with your health care provider. ?Document Released: 09/12/2015 Document Revised: 05/05/2016 Document Reviewed: 06/17/2015 ?Elsevier Interactive Patient Education ? 2017 [employer]. ? ?Fall Prevention in the Home ?Falls can cause injuries. They can happen to people of all ages. There are many things you can do to make your home safe and to help prevent falls. ?What can I do on the outside of my home? ?Regularly fix the edges of walkways and driveways and fix any cracks. ?Remove anything that might make you trip as you walk through a door, such as a raised step or threshold. ?Trim any bushes or trees on the path to your home. ?Use bright outdoor lighting. ?Clear any walking paths of anything that might make someone trip, such as rocks or tools. ?Regularly check to see if handrails are loose or broken. Make sure that both sides of any steps have handrails. ?Any raised decks and porches should have guardrails on the edges. ?Have any leaves, snow, or ice cleared regularly. ?Use sand or salt on walking paths during winter. ?Clean up any spills in your garage right away. This includes oil or grease spills. ?What can I do in the bathroom? ?Use night lights. ?Install grab bars by the toilet and in the tub and shower. Do not use towel bars as grab bars. ?Use non-skid mats or decals in the tub or shower. ?If you need to sit down in the shower, use a plastic, non-slip stool. ?Keep the floor dry. Clean up any water that spills on the floor as soon as it  happens. ?Remove soap buildup in the tub or shower regularly. ?Attach bath mats securely with double-sided non-slip rug tape. ?Do not have throw rugs and other things on the floor that can make you trip. ?What can I do in the bedroom? ?Use night lights. ?Make sure that you have a light by your bed that is easy to reach. ?Do not use any sheets or blankets that are too big for your bed. They should not hang down onto the floor. ?Have a firm chair that has side arms. You can use this for support while you get dressed. ?Do not have throw rugs and other things on the floor that can make you trip. ?What can I do in the kitchen? ?Clean up any spills right away. ?Avoid walking on wet floors. ?Keep items that you use a lot in easy-to-reach places. ?If you need to reach something above you, use a strong step stool that has a grab bar. ?Keep electrical cords out of the way. ?Do not use floor polish or wax that makes floors slippery. If you must use wax, use non-skid floor wax. ?Do not have throw rugs and other things on the floor that can make you trip. ?What can I do with my stairs? ?Do not leave any items on the stairs. ?Make sure that there are handrails on both sides of the stairs and use them. Fix handrails that are broken  or loose. Make sure that handrails are as long as the stairways. ?Check any carpeting to make sure that it is firmly attached to the stairs. Fix any carpet that is loose or worn. ?Avoid having throw rugs at the top or bottom of the stairs. If you do have throw rugs, attach them to the floor with carpet tape. ?Make sure that you have a light switch at the top of the stairs and the bottom of the stairs. If you do not have them, ask someone to add them for you. ?What else can I do to help prevent falls? ?Wear shoes that: ?Do not have high heels. ?Have rubber bottoms. ?Are comfortable and fit you well. ?Are closed at the toe. Do not wear sandals. ?If you use a stepladder: ?Make sure that it is fully opened.  Do not climb a closed stepladder. ?Make sure that both sides of the stepladder are locked into place. ?Ask someone to hold it for you, if possible. ?Clearly mark and make sure that you can see: ?Any grab bars or han

## 2022-01-01 DIAGNOSIS — K766 Portal hypertension: Secondary | ICD-10-CM | POA: Diagnosis not present

## 2022-01-01 DIAGNOSIS — K3189 Other diseases of stomach and duodenum: Secondary | ICD-10-CM | POA: Diagnosis not present

## 2022-01-01 DIAGNOSIS — K921 Melena: Secondary | ICD-10-CM | POA: Diagnosis not present

## 2022-01-13 ENCOUNTER — Other Ambulatory Visit: Payer: Self-pay | Admitting: Family Medicine

## 2022-01-13 DIAGNOSIS — F32A Depression, unspecified: Secondary | ICD-10-CM

## 2022-01-13 DIAGNOSIS — Z7984 Long term (current) use of oral hypoglycemic drugs: Secondary | ICD-10-CM | POA: Diagnosis not present

## 2022-01-13 DIAGNOSIS — E119 Type 2 diabetes mellitus without complications: Secondary | ICD-10-CM | POA: Diagnosis not present

## 2022-01-18 ENCOUNTER — Ambulatory Visit (HOSPITAL_BASED_OUTPATIENT_CLINIC_OR_DEPARTMENT_OTHER)
Admission: RE | Admit: 2022-01-18 | Discharge: 2022-01-18 | Disposition: A | Discharge status: Home / Self Care | Payer: PPO | Source: Ambulatory Visit | Attending: Hematology & Oncology | Admitting: Hematology & Oncology

## 2022-01-18 DIAGNOSIS — R161 Splenomegaly, not elsewhere classified: Secondary | ICD-10-CM | POA: Diagnosis not present

## 2022-01-18 DIAGNOSIS — K7581 Nonalcoholic steatohepatitis (NASH): Secondary | ICD-10-CM | POA: Insufficient documentation

## 2022-01-21 ENCOUNTER — Other Ambulatory Visit: Payer: Self-pay

## 2022-01-21 ENCOUNTER — Inpatient Hospital Stay: Payer: PPO | Admitting: Hematology & Oncology

## 2022-01-21 ENCOUNTER — Encounter: Payer: Self-pay | Admitting: Hematology & Oncology

## 2022-01-21 ENCOUNTER — Inpatient Hospital Stay: Payer: PPO | Attending: Family

## 2022-01-21 VITALS — BP 127/62 | HR 83 | Temp 98.1°F | Resp 18 | Wt 185.0 lb

## 2022-01-21 DIAGNOSIS — K7581 Nonalcoholic steatohepatitis (NASH): Secondary | ICD-10-CM | POA: Insufficient documentation

## 2022-01-21 DIAGNOSIS — R161 Splenomegaly, not elsewhere classified: Secondary | ICD-10-CM | POA: Diagnosis not present

## 2022-01-21 DIAGNOSIS — G309 Alzheimer's disease, unspecified: Secondary | ICD-10-CM | POA: Diagnosis not present

## 2022-01-21 DIAGNOSIS — D696 Thrombocytopenia, unspecified: Secondary | ICD-10-CM | POA: Diagnosis not present

## 2022-01-21 DIAGNOSIS — D6959 Other secondary thrombocytopenia: Secondary | ICD-10-CM | POA: Insufficient documentation

## 2022-01-21 LAB — RETICULOCYTES
Immature Retic Fract: 15.4 % (ref 2.3–15.9)
RBC.: 4.24 MIL/uL (ref 3.87–5.11)
Retic Count, Absolute: 102.2 10*3/uL (ref 19.0–186.0)
Retic Ct Pct: 2.4 % (ref 0.4–3.1)

## 2022-01-21 LAB — CBC WITH DIFFERENTIAL (CANCER CENTER ONLY)
Abs Immature Granulocytes: 0.05 10*3/uL (ref 0.00–0.07)
Basophils Absolute: 0.1 10*3/uL (ref 0.0–0.1)
Basophils Relative: 1 %
Eosinophils Absolute: 0.1 10*3/uL (ref 0.0–0.5)
Eosinophils Relative: 1 %
HCT: 35.8 % — ABNORMAL LOW (ref 36.0–46.0)
Hemoglobin: 12 g/dL (ref 12.0–15.0)
Immature Granulocytes: 1 %
Lymphocytes Relative: 21 %
Lymphs Abs: 1.7 10*3/uL (ref 0.7–4.0)
MCH: 27.9 pg (ref 26.0–34.0)
MCHC: 33.5 g/dL (ref 30.0–36.0)
MCV: 83.3 fL (ref 80.0–100.0)
Monocytes Absolute: 0.8 10*3/uL (ref 0.1–1.0)
Monocytes Relative: 10 %
Neutro Abs: 5.4 10*3/uL (ref 1.7–7.7)
Neutrophils Relative %: 66 %
Platelet Count: 86 10*3/uL — ABNORMAL LOW (ref 150–400)
RBC: 4.3 MIL/uL (ref 3.87–5.11)
RDW: 17.4 % — ABNORMAL HIGH (ref 11.5–15.5)
WBC Count: 8.1 10*3/uL (ref 4.0–10.5)
nRBC: 0 % (ref 0.0–0.2)

## 2022-01-21 LAB — CMP (CANCER CENTER ONLY)
ALT: 13 U/L (ref 0–44)
AST: 21 U/L (ref 15–41)
Albumin: 4.6 g/dL (ref 3.5–5.0)
Alkaline Phosphatase: 52 U/L (ref 38–126)
Anion gap: 8 (ref 5–15)
BUN: 22 mg/dL (ref 8–23)
CO2: 33 mmol/L — ABNORMAL HIGH (ref 22–32)
Calcium: 10.1 mg/dL (ref 8.9–10.3)
Chloride: 97 mmol/L — ABNORMAL LOW (ref 98–111)
Creatinine: 0.98 mg/dL (ref 0.44–1.00)
GFR, Estimated: 59 mL/min — ABNORMAL LOW (ref >60)
Glucose, Bld: 138 mg/dL — ABNORMAL HIGH (ref 70–99)
Potassium: 3.7 mmol/L (ref 3.5–5.1)
Sodium: 138 mmol/L (ref 135–145)
Total Bilirubin: 1 mg/dL (ref 0.3–1.2)
Total Protein: 7.9 g/dL (ref 6.5–8.1)

## 2022-01-21 LAB — IRON AND IRON BINDING CAPACITY (CC-WL,HP ONLY)
Iron: 87 ug/dL (ref 28–170)
Saturation Ratios: 17 % (ref 10.4–31.8)
TIBC: 505 ug/dL — ABNORMAL HIGH (ref 250–450)
UIBC: 418 ug/dL (ref 148–442)

## 2022-01-21 LAB — SAVE SMEAR(SSMR), FOR PROVIDER SLIDE REVIEW

## 2022-01-21 LAB — FERRITIN: Ferritin: 15 ng/mL (ref 11–307)

## 2022-01-21 NOTE — Progress Notes (Signed)
Hematology and Oncology Follow Up Visit  Rachael Buck [id number] 03-02-1941 81 y.o. 01/21/2022   Principle Diagnosis:  Thrombocytopenia -- NASH/Splenomegaly ; Immune based thrombocytopenia Iron deficiency    Current Therapy:        Nplate as indicated  IV iron as indicated  -- Feraheme given on -12/15/2021    Interim History:  Rachael Buck is here today for follow-up.  We did have to give her IV iron back in April.  At that time, her ferritin was 25 with an iron saturation of 9%.  This is worked very well.  She apparently had upper and lower endoscopy by Dr. Watt Climes.  I have we will have to get the results back from this.  Sound like she may have had some varices.  Frothing from what she said, this may have also shown some ulcerations.  We did do an ultrasound of her spleen.  This was done a week ago.  Thankfully, the spleen is not any larger in size.  I think it measures about 780 cm.  She has had no cough or shortness of breath.  There is been no rashes.  She has had no leg swelling.  She has had no nausea or vomiting.  She did have a nice Mother's Day.  She has had problems with her poor husband.  He has Alzheimer's.  I think we will see him later on today.  Currently, her performance status is ECOG 1.     Medications:  Allergies as of 01/21/2022       Reactions   Codeine Nausea And Vomiting   Metformin And Related Diarrhea   Shrimp [shellfish Allergy] Swelling        Medication List        Accurate as of Jan 21, 2022 11:08 AM. If you have any questions, ask your nurse or doctor.          acetaminophen 500 MG tablet Commonly known as: TYLENOL Take 500 mg by mouth every 6 (six) hours as needed for mild pain.   albuterol 108 (90 Base) MCG/ACT inhaler Commonly known as: ProAir HFA Inhale 2 puffs into the lungs every 4 (four) hours as needed for wheezing or shortness of breath.   ALPRAZolam 0.5 MG tablet Commonly known as: XANAX TAKE 1 TABLET(0.5 MG) BY  MOUTH THREE TIMES DAILY   blood glucose meter kit and supplies Kit Dispense based on patient and insurance preference. Use up to four times daily as directed.   diphenhydrAMINE 25 MG tablet Commonly known as: BENADRYL Take 25 mg by mouth every 6 (six) hours as needed (for allergies.).   glipiZIDE 5 MG 24 hr tablet Commonly known as: GLUCOTROL XL Take 1 tablet (5 mg total) by mouth daily with breakfast.   hydrochlorothiazide 25 MG tablet Commonly known as: HYDRODIURIL TAKE 1 TABLET(25 MG) BY MOUTH DAILY   hydrocortisone 2.5 % cream Apply 1 application topically daily as needed (itchy dry skin).   Jardiance 10 MG Tabs tablet Generic drug: empagliflozin TAKE 1 TABLET(10 MG) BY MOUTH DAILY   lisinopril 20 MG tablet Commonly known as: ZESTRIL Take 1 tablet (20 mg total) by mouth daily.   loperamide 2 MG capsule Commonly known as: IMODIUM Take 4-6 mg by mouth daily as needed (for IBS symptoms).   Melatonin 10 MG Tabs Take 10 mg by mouth at bedtime.   OneTouch Delica Lancets Fine Misc 1 each by Other route daily.   OneTouch Ultra test strip Generic drug: glucose blood TEST BLOOD  SUGAR(S) ONCE DAILY IN THE MORNING   sertraline 100 MG tablet Commonly known as: ZOLOFT TAKE 1 TABLET(100 MG) BY MOUTH DAILY   simvastatin 80 MG tablet Commonly known as: ZOCOR TAKE 1 TABLET(80 MG) BY MOUTH DAILY   Stiolto Respimat 2.5-2.5 MCG/ACT Aers Generic drug: Tiotropium Bromide-Olodaterol Inhale 2 puffs into the lungs daily.   sucralfate 1 g tablet Commonly known as: CARAFATE Take 1 g by mouth 4 (four) times daily as needed.   traMADol 50 MG tablet Commonly known as: ULTRAM TAKE 1 TABLET(50 MG) BY MOUTH EVERY 6 HOURS AS NEEDED        Allergies:  Allergies  Allergen Reactions   Codeine Nausea And Vomiting   Metformin And Related Diarrhea   Shrimp [Shellfish Allergy] Swelling    Past Medical History, Surgical history, Social history, and Family History were reviewed and  updated.  Review of Systems: Review of Systems  Constitutional:  Positive for malaise/fatigue.  HENT: Negative.    Eyes: Negative.   Respiratory: Negative.    Cardiovascular: Negative.   Gastrointestinal:  Positive for abdominal pain.  Genitourinary: Negative.   Musculoskeletal: Negative.   Skin: Negative.   Neurological: Negative.   Endo/Heme/Allergies: Negative.   Psychiatric/Behavioral: Negative.      Physical Exam:  weight is 185 lb (83.9 kg). Her oral temperature is 98.1 F (36.7 C). Her blood pressure is 127/62 and her pulse is 83. Her respiration is 18 and oxygen saturation is 90%.   Wt Readings from Last 3 Encounters:  01/21/22 185 lb (83.9 kg)  12/09/21 186 lb (84.4 kg)  11/19/21 182 lb (82.6 kg)    Physical Exam Vitals reviewed.  HENT:     Head: Normocephalic and atraumatic.  Eyes:     Pupils: Pupils are equal, round, and reactive to light.  Cardiovascular:     Rate and Rhythm: Normal rate and regular rhythm.     Heart sounds: Normal heart sounds.  Pulmonary:     Effort: Pulmonary effort is normal.     Breath sounds: Normal breath sounds.  Abdominal:     General: Bowel sounds are normal.     Palpations: Abdomen is soft.  Musculoskeletal:        General: No tenderness or deformity. Normal range of motion.     Cervical back: Normal range of motion.  Lymphadenopathy:     Cervical: No cervical adenopathy.  Skin:    General: Skin is warm and dry.     Findings: No erythema or rash.  Neurological:     Mental Status: She is alert and oriented to person, place, and time.  Psychiatric:        Behavior: Behavior normal.        Thought Content: Thought content normal.        Judgment: Judgment normal.     Lab Results  Component Value Date   WBC 8.1 01/21/2022   HGB 12.0 01/21/2022   HCT 35.8 (L) 01/21/2022   MCV 83.3 01/21/2022   PLT 86 (L) 01/21/2022   Lab Results  Component Value Date   FERRITIN 25 12/09/2021   IRON 44 12/09/2021   TIBC 494 (H)  12/09/2021   UIBC 450 (H) 12/09/2021   IRONPCTSAT 9 (L) 12/09/2021   Lab Results  Component Value Date   RETICCTPCT 2.4 01/21/2022   RBC 4.24 01/21/2022   No results found for: KPAFRELGTCHN, LAMBDASER, KAPLAMBRATIO No results found for: IGGSERUM, IGA, IGMSERUM No results found for: TOTALPROTELP, ALBUMINELP, A1GS, A2GS, BETS, BETA2SER, GAMS,  MSPIKE, SPEI   Chemistry      Component Value Date/Time   NA 138 01/21/2022 1002   NA 127 (L) 02/14/2017 1335   K 3.7 01/21/2022 1002   K 3.6 02/14/2017 1335   CL 97 (L) 01/21/2022 1002   CL 92 (L) 02/14/2017 1335   CO2 33 (H) 01/21/2022 1002   CO2 28 02/14/2017 1335   BUN 22 01/21/2022 1002   BUN 7 (L) 02/14/2017 1335   CREATININE 0.98 01/21/2022 1002   CREATININE 0.47 (L) 02/14/2017 1335   CREATININE 0.56 07/24/2014 1204      Component Value Date/Time   CALCIUM 10.1 01/21/2022 1002   CALCIUM 9.3 02/14/2017 1335   ALKPHOS 52 01/21/2022 1002   ALKPHOS 68 02/14/2017 1335   AST 21 01/21/2022 1002   ALT 13 01/21/2022 1002   BILITOT 1.0 01/21/2022 1002      Impression and Plan: Ms. 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  I am happy that her blood counts are better.  Both her hemoglobin and her platelet count are better.  She will have splenomegaly chronically.  Thankfully it is no larger in size.  This is certainly important.  I think we can probably try to get her back in a couple more months.  We will certainly try to coordinate her appointments with her husband's appointments.  I know it is hard for him to get in here because of his Alzheimer's.     [name], MD 5/25/202311:08 AM

## 2022-02-03 ENCOUNTER — Other Ambulatory Visit: Payer: Self-pay | Admitting: Family Medicine

## 2022-02-03 DIAGNOSIS — E119 Type 2 diabetes mellitus without complications: Secondary | ICD-10-CM

## 2022-02-25 ENCOUNTER — Other Ambulatory Visit: Payer: PPO

## 2022-02-25 ENCOUNTER — Ambulatory Visit: Payer: PPO | Admitting: Hematology & Oncology

## 2022-02-25 ENCOUNTER — Ambulatory Visit: Payer: PPO

## 2022-03-07 ENCOUNTER — Other Ambulatory Visit: Payer: Self-pay | Admitting: Hematology & Oncology

## 2022-03-07 DIAGNOSIS — M25559 Pain in unspecified hip: Secondary | ICD-10-CM

## 2022-03-07 DIAGNOSIS — M545 Low back pain, unspecified: Secondary | ICD-10-CM

## 2022-03-08 ENCOUNTER — Encounter: Payer: Self-pay | Admitting: Hematology & Oncology

## 2022-03-22 ENCOUNTER — Other Ambulatory Visit: Payer: Self-pay | Admitting: Family Medicine

## 2022-03-29 ENCOUNTER — Other Ambulatory Visit: Payer: Self-pay

## 2022-03-29 NOTE — Patient Outreach (Signed)
  Care Coordination   Initial Visit Note   03/29/2022 Name: Rachael Buck MRN: [id number] DOB: 1940-12-26  Rachael Buck is a 81 y.o. year old female who sees Copland, Gay Filler, MD for primary care. I spoke with  Margot Chimes by phone today  What matters to the patients health and wellness today?  Hearing, Has been tested in the past and was borderline about 3-4 years ago. Would like follow up. Reports blood sugars are controlled. Reports blood sugars running mid 90-100's. Denies any diabetes management needs. But states she is in need of a new CBG machine.   Goals Addressed             This Visit's Progress    Resources: Hearing tested and would like a new CBG machine       Care Coordination Interventions: Advised patient to contact health insurance plan regarding in network provider office for hearing testing and follow up with primary care provider regarding referral if needed Advised patient to contact health insurance plan regarding glucose meters covered by the plan, then encouraged to contact primary care provider to request prescription Encouraged to contact RN case manager if any care management and or care coordination needs         SDOH assessments and interventions completed:   Yes SDOH Interventions Today    Flowsheet Row Most Recent Value  SDOH Interventions   Food Insecurity Interventions Intervention Not Indicated  Housing Interventions Intervention Not Indicated  Transportation Interventions Intervention Not Indicated       Care Coordination Interventions Activated:  Yes Care Coordination Interventions:  Yes, provided  Follow up plan: Follow up call scheduled for 04/29/22  Encounter Outcome:  Pt. Visit Completed  Thea Silversmith, RN, MSN, BSN, CCM Care Management Coordinator [phone]

## 2022-03-29 NOTE — Patient Instructions (Signed)
Visit Information  Thank you for taking time to visit with me today. Please don't hesitate to contact me if I can be of assistance to you.   Following are the goals we discussed today:   Goals Addressed             This Visit's Progress    Resources: Hearing tested and would like a new CBG machine       Care Coordination Interventions: Advised patient to contact health insurance plan regarding in network provider office for hearing testing and follow up with primary care provider regarding referral if needed Advised patient to contact health insurance plan regarding glucose meters covered by the plan, then encouraged to contact primary care provider to request prescription Encouraged to contact RN case manager if any care management and or care coordination needs         Our next appointment is by telephone on 04/29/22 at 2:00 pm  Please call the care guide team at [phone] if you need to cancel or reschedule your appointment.   If you are experiencing a Mental Health or [place] or need someone to talk to, please call the Suicide and Crisis Lifeline: 988 call 1-800-273-TALK (toll free, 24 hour hotline)  Patient verbalizes understanding of instructions and care plan provided today and agrees to view in [place]. Active MyChart status and patient understanding of how to access instructions and care plan via MyChart confirmed with patient.     The patient has been provided with contact information for the care management team and has been advised to call with any health related questions or concerns.   Thea Silversmith, RN, MSN, BSN, CCM Care Management Coordinator [phone]

## 2022-04-08 ENCOUNTER — Encounter: Payer: Self-pay | Admitting: *Deleted

## 2022-04-08 ENCOUNTER — Inpatient Hospital Stay: Payer: PPO | Admitting: Hematology & Oncology

## 2022-04-08 ENCOUNTER — Encounter: Payer: Self-pay | Admitting: Hematology & Oncology

## 2022-04-08 ENCOUNTER — Inpatient Hospital Stay: Payer: PPO | Attending: Family

## 2022-04-08 VITALS — BP 90/64 | HR 79 | Temp 98.5°F | Resp 20 | Wt 188.8 lb

## 2022-04-08 DIAGNOSIS — I83892 Varicose veins of left lower extremities with other complications: Secondary | ICD-10-CM

## 2022-04-08 DIAGNOSIS — K7581 Nonalcoholic steatohepatitis (NASH): Secondary | ICD-10-CM

## 2022-04-08 DIAGNOSIS — D6959 Other secondary thrombocytopenia: Secondary | ICD-10-CM | POA: Insufficient documentation

## 2022-04-08 DIAGNOSIS — R1010 Upper abdominal pain, unspecified: Secondary | ICD-10-CM | POA: Insufficient documentation

## 2022-04-08 DIAGNOSIS — D696 Thrombocytopenia, unspecified: Secondary | ICD-10-CM | POA: Diagnosis not present

## 2022-04-08 DIAGNOSIS — R161 Splenomegaly, not elsewhere classified: Secondary | ICD-10-CM | POA: Insufficient documentation

## 2022-04-08 DIAGNOSIS — Z79899 Other long term (current) drug therapy: Secondary | ICD-10-CM | POA: Diagnosis not present

## 2022-04-08 LAB — FERRITIN: Ferritin: 17 ng/mL (ref 11–307)

## 2022-04-08 LAB — CMP (CANCER CENTER ONLY)
ALT: 13 U/L (ref 0–44)
AST: 21 U/L (ref 15–41)
Albumin: 4.8 g/dL (ref 3.5–5.0)
Alkaline Phosphatase: 58 U/L (ref 38–126)
Anion gap: 7 (ref 5–15)
BUN: 23 mg/dL (ref 8–23)
CO2: 36 mmol/L — ABNORMAL HIGH (ref 22–32)
Calcium: 10.3 mg/dL (ref 8.9–10.3)
Chloride: 96 mmol/L — ABNORMAL LOW (ref 98–111)
Creatinine: 0.98 mg/dL (ref 0.44–1.00)
GFR, Estimated: 59 mL/min — ABNORMAL LOW (ref >60)
Glucose, Bld: 135 mg/dL — ABNORMAL HIGH (ref 70–99)
Potassium: 3.8 mmol/L (ref 3.5–5.1)
Sodium: 139 mmol/L (ref 135–145)
Total Bilirubin: 1.1 mg/dL (ref 0.3–1.2)
Total Protein: 7.6 g/dL (ref 6.5–8.1)

## 2022-04-08 LAB — CBC WITH DIFFERENTIAL (CANCER CENTER ONLY)
Abs Immature Granulocytes: 0.05 10*3/uL (ref 0.00–0.07)
Basophils Absolute: 0.1 10*3/uL (ref 0.0–0.1)
Basophils Relative: 1 %
Eosinophils Absolute: 0 10*3/uL (ref 0.0–0.5)
Eosinophils Relative: 1 %
HCT: 39.5 % (ref 36.0–46.0)
Hemoglobin: 13 g/dL (ref 12.0–15.0)
Immature Granulocytes: 1 %
Lymphocytes Relative: 23 %
Lymphs Abs: 1.5 10*3/uL (ref 0.7–4.0)
MCH: 28 pg (ref 26.0–34.0)
MCHC: 32.9 g/dL (ref 30.0–36.0)
MCV: 84.9 fL (ref 80.0–100.0)
Monocytes Absolute: 0.7 10*3/uL (ref 0.1–1.0)
Monocytes Relative: 11 %
Neutro Abs: 4.2 10*3/uL (ref 1.7–7.7)
Neutrophils Relative %: 63 %
Platelet Count: 73 10*3/uL — ABNORMAL LOW (ref 150–400)
RBC: 4.65 MIL/uL (ref 3.87–5.11)
RDW: 16.7 % — ABNORMAL HIGH (ref 11.5–15.5)
WBC Count: 6.6 10*3/uL (ref 4.0–10.5)
nRBC: 0 % (ref 0.0–0.2)

## 2022-04-08 LAB — RETICULOCYTES
Immature Retic Fract: 11.2 % (ref 2.3–15.9)
RBC.: 4.58 MIL/uL (ref 3.87–5.11)
Retic Count, Absolute: 88.4 10*3/uL (ref 19.0–186.0)
Retic Ct Pct: 1.9 % (ref 0.4–3.1)

## 2022-04-08 LAB — IRON AND IRON BINDING CAPACITY (CC-WL,HP ONLY)
Iron: 111 ug/dL (ref 28–170)
Saturation Ratios: 22 % (ref 10.4–31.8)
TIBC: 497 ug/dL — ABNORMAL HIGH (ref 250–450)
UIBC: 386 ug/dL (ref 148–442)

## 2022-04-08 NOTE — Progress Notes (Signed)
Hematology and Oncology Follow Up Visit  Rachael Buck [id number] Feb 22, 1941 81 y.o. 04/08/2022   Principle Diagnosis:  Thrombocytopenia -- NASH/Splenomegaly ; Immune based thrombocytopenia Iron deficiency    Current Therapy:        Nplate as indicated  IV iron as indicated  -- Feraheme given on -12/15/2021    Interim History:  Rachael Buck is here today for follow-up.  She is doing pretty well.  She is having some upper abdominal pain.  I am sure this is some that she will have on occasion.  She does have some slight splenomegaly.  She has NASH.  Would not surprise me if she had occasional pain.  We last saw her in May, her ferritin was 15 with an iron saturation of 17%.  We will have to see what her iron studies look like this time.  She has had no problems with bleeding.  There is been no melena or bright red blood per rectum.  She has not had a little bit of dizziness.  Her blood pressure is awful low.  I told her to stop the lisinopril.  She has had no fever.  She has had no rashes.  There is been no leg swelling.  Overall, I would say her performance status is probably ECOG 1.       Medications:  Allergies as of 04/08/2022       Reactions   Codeine Nausea And Vomiting   Metformin And Related Diarrhea   Shrimp [shellfish Allergy] Swelling        Medication List        Accurate as of April 08, 2022 11:04 AM. If you have any questions, ask your nurse or doctor.          acetaminophen 500 MG tablet Commonly known as: TYLENOL Take 500 mg by mouth every 6 (six) hours as needed for mild pain.   albuterol 108 (90 Base) MCG/ACT inhaler Commonly known as: ProAir HFA Inhale 2 puffs into the lungs every 4 (four) hours as needed for wheezing or shortness of breath.   ALPRAZolam 0.5 MG tablet Commonly known as: XANAX TAKE 1 TABLET(0.5 MG) BY MOUTH THREE TIMES DAILY   blood glucose meter kit and supplies Kit Dispense based on patient and insurance preference.  Use up to four times daily as directed.   colestipol 1 g tablet Commonly known as: COLESTID   diphenhydrAMINE 25 MG tablet Commonly known as: BENADRYL Take 25 mg by mouth every 6 (six) hours as needed (for allergies.).   glipiZIDE 5 MG 24 hr tablet Commonly known as: GLUCOTROL XL TAKE 1 TABLET(5 MG) BY MOUTH DAILY WITH BREAKFAST   hydrochlorothiazide 25 MG tablet Commonly known as: HYDRODIURIL TAKE 1 TABLET(25 MG) BY MOUTH DAILY   hydrocortisone 2.5 % cream Apply 1 application topically daily as needed (itchy dry skin).   ipratropium-albuterol 0.5-2.5 (3) MG/3ML Soln Commonly known as: DUONEB VVN Q 6 H PRN   Jardiance 10 MG Tabs tablet Generic drug: empagliflozin TAKE 1 TABLET(10 MG) BY MOUTH DAILY   lisinopril 20 MG tablet Commonly known as: ZESTRIL Take 1 tablet (20 mg total) by mouth daily.   loperamide 2 MG capsule Commonly known as: IMODIUM Take 4-6 mg by mouth daily as needed (for IBS symptoms).   losartan 50 MG tablet Commonly known as: COZAAR   Melatonin 10 MG Tabs Take 10 mg by mouth at bedtime.   omeprazole 40 MG capsule Commonly known as: PRILOSEC TK 1 C PO QD  OneTouch Delica Lancets Fine Misc 1 each by Other route daily.   OneTouch Ultra test strip Generic drug: glucose blood TEST BLOOD SUGAR(S) ONCE DAILY IN THE MORNING   sertraline 100 MG tablet Commonly known as: ZOLOFT TAKE 1 TABLET(100 MG) BY MOUTH DAILY   simvastatin 80 MG tablet Commonly known as: ZOCOR TAKE 1 TABLET(80 MG) BY MOUTH DAILY   Stiolto Respimat 2.5-2.5 MCG/ACT Aers Generic drug: Tiotropium Bromide-Olodaterol Inhale 2 puffs into the lungs daily.   sucralfate 1 g tablet Commonly known as: CARAFATE Take 1 g by mouth 4 (four) times daily as needed.   traMADol 50 MG tablet Commonly known as: ULTRAM TAKE 1 TABLET(50 MG) BY MOUTH EVERY 6 HOURS AS NEEDED        Allergies:  Allergies  Allergen Reactions   Codeine Nausea And Vomiting   Metformin And Related  Diarrhea   Shrimp [Shellfish Allergy] Swelling    Past Medical History, Surgical history, Social history, and Family History were reviewed and updated.  Review of Systems: Review of Systems  Constitutional:  Positive for malaise/fatigue.  HENT: Negative.    Eyes: Negative.   Respiratory: Negative.    Cardiovascular: Negative.   Gastrointestinal:  Positive for abdominal pain.  Genitourinary: Negative.   Musculoskeletal: Negative.   Skin: Negative.   Neurological: Negative.   Endo/Heme/Allergies: Negative.   Psychiatric/Behavioral: Negative.       Physical Exam:  weight is 188 lb 12.8 oz (85.6 kg). Her oral temperature is 98.5 F (36.9 C). Her blood pressure is 90/64 and her pulse is 79. Her respiration is 20 and oxygen saturation is 95%.   Wt Readings from Last 3 Encounters:  04/08/22 188 lb 12.8 oz (85.6 kg)  01/21/22 185 lb (83.9 kg)  12/09/21 186 lb (84.4 kg)    Physical Exam Vitals reviewed.  HENT:     Head: Normocephalic and atraumatic.  Eyes:     Pupils: Pupils are equal, round, and reactive to light.  Cardiovascular:     Rate and Rhythm: Normal rate and regular rhythm.     Heart sounds: Normal heart sounds.  Pulmonary:     Effort: Pulmonary effort is normal.     Breath sounds: Normal breath sounds.  Abdominal:     General: Bowel sounds are normal.     Palpations: Abdomen is soft.  Musculoskeletal:        General: No tenderness or deformity. Normal range of motion.     Cervical back: Normal range of motion.  Lymphadenopathy:     Cervical: No cervical adenopathy.  Skin:    General: Skin is warm and dry.     Findings: No erythema or rash.  Neurological:     Mental Status: She is alert and oriented to person, place, and time.  Psychiatric:        Behavior: Behavior normal.        Thought Content: Thought content normal.        Judgment: Judgment normal.     Lab Results  Component Value Date   WBC 6.6 04/08/2022   HGB 13.0 04/08/2022   HCT 39.5  04/08/2022   MCV 84.9 04/08/2022   PLT 73 (L) 04/08/2022   Lab Results  Component Value Date   FERRITIN 15 01/21/2022   IRON 87 01/21/2022   TIBC 505 (H) 01/21/2022   UIBC 418 01/21/2022   IRONPCTSAT 17 01/21/2022   Lab Results  Component Value Date   RETICCTPCT 1.9 04/08/2022   RBC 4.58 04/08/2022  No results found for: "KPAFRELGTCHN", "LAMBDASER", "KAPLAMBRATIO" No results found for: "IGGSERUM", "IGA", "IGMSERUM" No results found for: "TOTALPROTELP", "ALBUMINELP", "A1GS", "A2GS", "BETS", "BETA2SER", "GAMS", "MSPIKE", "SPEI"   Chemistry      Component Value Date/Time   NA 139 04/08/2022 0954   NA 127 (L) 02/14/2017 1335   K 3.8 04/08/2022 0954   K 3.6 02/14/2017 1335   CL 96 (L) 04/08/2022 0954   CL 92 (L) 02/14/2017 1335   CO2 36 (H) 04/08/2022 0954   CO2 28 02/14/2017 1335   BUN 23 04/08/2022 0954   BUN 7 (L) 02/14/2017 1335   CREATININE 0.98 04/08/2022 0954   CREATININE 0.47 (L) 02/14/2017 1335   CREATININE 0.56 07/24/2014 1204      Component Value Date/Time   CALCIUM 10.3 04/08/2022 0954   CALCIUM 9.3 02/14/2017 1335   ALKPHOS 58 04/08/2022 0954   ALKPHOS 68 02/14/2017 1335   AST 21 04/08/2022 0954   ALT 13 04/08/2022 0954   BILITOT 1.1 04/08/2022 0954      Impression and Plan: Rachael Buck is a very pleasant 81 yo caucasian female with 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  Everything is holding pretty steady.  We will see what her iron studies show.  I do not think we need any scans on her right now.  As always, we will get her back the same day that we see her and her husband.  It is just so much easier when they come in together.    [name], MD 8/10/202311:04 AM

## 2022-04-13 ENCOUNTER — Other Ambulatory Visit: Payer: Self-pay | Admitting: Hematology & Oncology

## 2022-04-13 DIAGNOSIS — M545 Low back pain, unspecified: Secondary | ICD-10-CM

## 2022-04-13 DIAGNOSIS — M25559 Pain in unspecified hip: Secondary | ICD-10-CM

## 2022-04-26 ENCOUNTER — Other Ambulatory Visit: Payer: Self-pay | Admitting: Family Medicine

## 2022-04-26 ENCOUNTER — Encounter: Payer: Self-pay | Admitting: Family Medicine

## 2022-04-29 ENCOUNTER — Ambulatory Visit: Payer: Self-pay

## 2022-04-29 NOTE — Patient Outreach (Signed)
  Care Coordination   04/29/2022 Name: Rachael Buck MRN: [id number] DOB: 18-Dec-1940   Care Coordination Outreach Attempts:  An unsuccessful telephone outreach was attempted for a scheduled appointment today.  Follow Up Plan:  Additional outreach attempts will be made to offer the patient care coordination information and services.   Encounter Outcome:  No Answer  Care Coordination Interventions Activated:  No   Care Coordination Interventions:  No, not indicated    Thea Silversmith, RN, MSN, BSN, CCM Care Coordinator [phone]

## 2022-05-11 ENCOUNTER — Ambulatory Visit: Payer: Self-pay

## 2022-05-11 ENCOUNTER — Telehealth: Payer: Self-pay | Admitting: *Deleted

## 2022-05-11 NOTE — Chronic Care Management (AMB) (Signed)
  Care Coordination  Note  05/11/2022 Name: Rachael Buck MRN: [id number] DOB: October 15, 1940  Rachael Buck is a 81 y.o. year old female who is a primary care patient of Copland, Gay Filler, MD. I reached out to Margot Chimes by phone today to offer care coordination services.      Rachael Buck was given information about Care Coordination services today including:  The Care Coordination services include support from the care team which includes your Nurse Coordinator, Clinical Social Worker, or Pharmacist.  The Care Coordination team is here to help remove barriers to the health concerns and goals most important to you. Care Coordination services are voluntary and the patient may decline or stop services at any time by request to their care team member.   Patient agreed to services and verbal consent obtained.   Follow up plan: Telephone appointment with care coordination team member scheduled for: 05/17/2022  Julian Hy, [place] Direct Dial: [phone]

## 2022-05-11 NOTE — Patient Instructions (Signed)
Visit Information  Thank you for taking time to visit with me today. Please don't hesitate to contact me if I can be of assistance to you.   Following are the goals we discussed today:   Goals Addressed             This Visit's Progress    Maintain and or improve Health       Care Coordination Interventions: Discussed importance of attending provider appointments as recommended Encouraged patient to contact GI doctor to schedule follow up Pharmacy referral for assistance with questions regarding diabetic medication and cost of medication-Jardiance Encouraged to contact care coordinator as needed. Verified patient has care coordinator's contact number.       Resources: Hearing tested and would like a new CBG machine       Care Coordination Interventions: Advised patient to take new glucose meter to pharmacy for assistance in setting up meter or follow up with primary care provider office Assisted with setting timeline for contacting provider for hearing test Encouraged to contact RN case manager if any care management and or care coordination needs         Our next appointment is by telephone on 07/12/22 at 11 am  Please call the care guide team at [phone] if you need to cancel or reschedule your appointment.   If you are experiencing a Mental Health or [place] or need someone to talk to, please call the Suicide and Crisis Lifeline: 988  Patient verbalizes understanding of instructions and care plan provided today and agrees to view in [place]. Active MyChart status and patient understanding of how to access instructions and care plan via MyChart confirmed with patient.     Thea Silversmith, RN, MSN, BSN, [place] Coordinator [phone]

## 2022-05-11 NOTE — Patient Outreach (Signed)
  Care Coordination   Follow Up Visit Note   05/11/2022 Name: Rachael Buck MRN: [id number] DOB: 15-Apr-1941  Rachael Buck is a 81 y.o. year old female who sees Copland, Gay Filler, MD for primary care. I spoke with  Margot Chimes by phone today.  What matters to the patients health and wellness today?  Patient reports she has not had a chance to call to schedule hearing test. She reports that she found out that her insurance will pay a portion of the cost. Ms. Mulgrew also expresses that she has obtained a new glucose meter (one touch Ultra Thin), however she does not know how to set the meter. She states she has been issues with her health due to NASH, and is being followed by Dr. Martha Clan and has an appointment in 2 weeks. She also reports she has had an endoscopy April 2023 and was supposed to follow up with Dr. Watt Climes, but did not follow up. She reports she is on Jardiance and is in the coverage gap. Reports cost of Jardiance $1300 for a 90 day supply. She reports she is also followed by [facility].   Goals Addressed             This Visit's Progress    Maintain and or improve Health       Care Coordination Interventions: Discussed importance of attending provider appointments as recommended Encouraged patient to contact GI doctor to schedule follow up Pharmacy referral for assistance with questions regarding diabetic medication and cost of medication-Jardiance Encouraged to contact care coordinator as needed. Verified patient has care coordinator's contact number.       Resources: Hearing tested and would like a new CBG machine       Care Coordination Interventions: Advised patient to take new glucose meter to pharmacy for assistance in setting up meter or follow up with primary care provider office Assisted with setting timeline for contacting provider for hearing test Encouraged to contact RN case manager if any care management and or care coordination needs          SDOH assessments and interventions completed:  No    Care Coordination Interventions Activated:  Yes  Care Coordination Interventions:  Yes, provided   Follow up plan: Follow up call scheduled for 07/12/22 per patient request for follow up 2-3 months.    Encounter Outcome:  Pt. Visit Completed   Thea Silversmith, RN, MSN, BSN, [place] Coordinator [phone]

## 2022-05-17 ENCOUNTER — Ambulatory Visit (INDEPENDENT_AMBULATORY_CARE_PROVIDER_SITE_OTHER): Payer: PPO | Admitting: Pharmacist

## 2022-05-17 ENCOUNTER — Other Ambulatory Visit: Payer: Self-pay | Admitting: Hematology & Oncology

## 2022-05-17 DIAGNOSIS — M25559 Pain in unspecified hip: Secondary | ICD-10-CM

## 2022-05-17 DIAGNOSIS — M545 Low back pain, unspecified: Secondary | ICD-10-CM

## 2022-05-17 DIAGNOSIS — E119 Type 2 diabetes mellitus without complications: Secondary | ICD-10-CM

## 2022-05-17 NOTE — Progress Notes (Signed)
Care Management   Pharmacy Note  05/17/2022 Name: Rachael Buck MRN: [id number] DOB: Oct 19, 1940  Subjective: Rachael Buck is a 81 y.o. year old female who is a primary care patient of Copland, Gay Filler, MD. Rachael Buck was referred by the [facility] and patient's PCP for medication management.   Engaged with patient by telephone for initial visit. Reviewed patient's current medication list. Patient is requesting assistance with the cost of Jardaince 72m- take 1 tablet daily. She reports that cost now that she has reached the coverage gap was $144 per 30 tablets.  Last A1c was 6.9%   SDOH (Social Determinants of Health) assessments and interventions performed:  SDOH Interventions    Flowsheet Row Patient Outreach Telephone from 03/29/2022 in [facility] recent [place] at 03/29/2022  9:38 AM Office Visit from 09/20/2017 in [place] recent [place] at 09/20/2017 10:23 AM Clinical Support from 09/20/2017 in [place] recent [place] at 09/20/2017  9:27 AM  SDOH Interventions     Food Insecurity Interventions Intervention Not Indicated -- --  Housing Interventions Intervention Not Indicated -- --  Transportation Interventions Intervention Not Indicated -- --  Depression Interventions/Treatment  -- Currently on Treatment Currently on Treatment        Objective:  Lab Results  Component Value Date   CREATININE 0.98 04/08/2022   CREATININE 0.98 01/21/2022   CREATININE 0.82 12/09/2021    Lab Results  Component Value Date   HGBA1C 6.9 (H) 04/01/2021       Component Value Date/Time   CHOL 123 10/29/2020 1508   TRIG 268.0 (H) 10/29/2020 1508   HDL 43.70 10/29/2020 1508   CHOLHDL 3 10/29/2020 1508   VLDL 53.6 (H) 10/29/2020 1508   LDLCALC 47 09/20/2017 1056   LDLDIRECT 46.0 10/29/2020 1508    BP Readings from  Last 3 Encounters:  04/08/22 90/64  01/21/22 127/62  12/15/21 93/63    Allergies  Allergen Reactions   Codeine Nausea And Vomiting   Metformin And Related Diarrhea   Shrimp [Shellfish Allergy] Swelling    Medications Reviewed Today     Reviewed by GCottie Banda RN (Registered Nurse) on 04/08/22 at 1[place]eralList Status: <None>   Medication Order Taking? Sig Documenting Provider Last Dose Status Informant  acetaminophen (TYLENOL) 500 MG tablet [id number] Take 500 mg by mouth every 6 (six) hours as needed for mild pain. [provider]  Active Self  albuterol (PROAIR HFA) 108 (90 Base) MCG/ACT inhaler [id number] Inhale 2 puffs into the lungs every 4 (four) hours as needed for wheezing or shortness of breath. TMidge Minium MD  Active Self  ALPRAZolam (Duanne Moron 0.5 MG tablet [id number] TAKE 1 TABLET(0.5 MG) BY MOUTH THREE TIMES DAILY Copland, JGay Filler MD  Active   blood glucose meter kit and supplies KIT [id number] Dispense based on patient and insurance preference. Use up to four times daily as directed. Copland, JGay Filler MD  Active Self  diphenhydrAMINE (BENADRYL) 25 MG tablet [id number] Take 25 mg by mouth every 6 (six) hours as needed (for allergies.). [provider]  Active Self  empagliflozin (JARDIANCE) 10 MG TABS tablet [id number] TAKE 1 TABLET(10 MG) BY MOUTH DAILY Copland, JGay Filler MD  Active   glipiZIDE (GLUCOTROL XL) 5 MG 24 hr tablet [id number] TAKE 1 TABLET(5 MG) BY MOUTH DAILY WITH BREAKFAST Copland, JGay Filler MD  Active  hydrochlorothiazide (HYDRODIURIL) 25 MG tablet [id number]  TAKE 1 TABLET(25 MG) BY MOUTH DAILY Copland, Gay Filler, MD  Active   hydrocortisone 2.5 % cream [id number]  Apply 1 application topically daily as needed (itchy dry skin). [provider]  Active Self  lisinopril (ZESTRIL) 20 MG tablet [id number]  Take 1 tablet (20 mg total) by mouth daily. Copland, Gay Filler, MD  Active   loperamide (IMODIUM) 2 MG capsule  [id number]  Take 4-6 mg by mouth daily as needed (for IBS symptoms). [provider]  Active Self  Melatonin 10 MG TABS [id number]  Take 10 mg by mouth at bedtime. [provider]  Active Self  Jonetta Speak LANCETS [place] [id number]  1 each by Other route daily. Copland, Gay Filler, MD  Active Self  ONETOUCH ULTRA test strip [id number]  TEST BLOOD SUGAR(S) ONCE DAILY IN THE MORNING Copland, Gay Filler, MD  Active Self  sertraline (ZOLOFT) 100 MG tablet [id number]  TAKE 1 TABLET(100 MG) BY MOUTH DAILY Copland, Gay Filler, MD  Active   simvastatin (ZOCOR) 80 MG tablet [id number]  TAKE 1 TABLET(80 MG) BY MOUTH DAILY Copland, Gay Filler, MD  Active   sucralfate (CARAFATE) 1 g tablet [id number]  Take 1 g by mouth 4 (four) times daily as needed. [provider]  Active   Tiotropium Bromide-Olodaterol (STIOLTO RESPIMAT) 2.5-2.5 MCG/ACT AERS [id number]  Inhale 2 puffs into the lungs daily. 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   Thrombocytopenia ([place]) 02/15/2017   Varicose veins of left lower extremity with complications 06/29/5944   Age-related cognitive decline 06/02/2015   Fatigue 07/19/2014   Orthostatic hypotension 05/14/2013   Nail abnormality 09/04/2012   Bleeding disorder ([place]) 05/08/2012   Varices, 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   NASH (nonalcoholic steatohepatitis) 03/21/2012   HTN (hypertension) 12/21/2011   Depression 03/28/2011   IBS (irritable bowel syndrome) 03/23/2011   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  Anticipated assistance start date 05/30/2022.  Also applied for LIS even though patients household income does not apply because [employer] cares program requires denial letter for LIS. Anticipate letter to arrive in the next 14 to 21 days from [employer].  Plan:  Applied for LIS (I anticipate patient will be denied but needed for [facility] Cares program Started application for [facility] application. Forwarded to PCP - Dr Lorelei Pont has already reviewed and signed. Patient will come to office to sign later this week.   Follow Up:  Patient agrees to Care Plan and Follow-up. Will check back in to see if she received letter in 2 to 3 weeks.   Cherre Robins, PharmD Clinical Pharmacist [facility]

## 2022-05-19 DIAGNOSIS — D696 Thrombocytopenia, unspecified: Secondary | ICD-10-CM | POA: Diagnosis not present

## 2022-05-19 DIAGNOSIS — J449 Chronic obstructive pulmonary disease, unspecified: Secondary | ICD-10-CM | POA: Diagnosis not present

## 2022-05-19 DIAGNOSIS — K7460 Unspecified cirrhosis of liver: Secondary | ICD-10-CM | POA: Diagnosis not present

## 2022-05-19 DIAGNOSIS — K219 Gastro-esophageal reflux disease without esophagitis: Secondary | ICD-10-CM | POA: Diagnosis not present

## 2022-05-19 DIAGNOSIS — Z6830 Body mass index (BMI) 30.0-30.9, adult: Secondary | ICD-10-CM | POA: Diagnosis not present

## 2022-05-27 ENCOUNTER — Telehealth: Payer: Self-pay | Admitting: Family Medicine

## 2022-05-27 ENCOUNTER — Telehealth: Payer: Self-pay | Admitting: Pharmacist

## 2022-05-27 MED ORDER — ALPRAZOLAM 0.5 MG PO TABS: ORAL_TABLET | ORAL | 0 refills | Status: DC

## 2022-05-27 NOTE — Telephone Encounter (Signed)
Okay to refill? 

## 2022-05-27 NOTE — Telephone Encounter (Signed)
Called pt- she is overdue for an appt.  Scheduled her for 10/11

## 2022-05-27 NOTE — Telephone Encounter (Signed)
Medication: ALPRAZolam (XANAX) 0.5 MG tablet   Has the patient contacted their pharmacy? No.  Preferred Pharmacy (with phone number or street name):  [id number] [address], [address] [facility] [address]  [address], [place] [address]  Phone:  [phone]  Fax:  [phone]

## 2022-05-27 NOTE — Telephone Encounter (Signed)
Received patient's completed application for medication assistance program for Jardiance. Completed provider portion and forward to Dr Larose Kells to review and sign. Dr Larose Kells has signed.  Medication assistance program application is almost ready to fax. Still need a copy of letter from [facility] regarding patient's application from [employer].  We applied 05/17/2022 do she might not have received letter yet. Called patient to check but had to LM on VM

## 2022-05-31 ENCOUNTER — Telehealth: Payer: Self-pay | Admitting: Family Medicine

## 2022-05-31 NOTE — Telephone Encounter (Signed)
Noted  

## 2022-05-31 NOTE — Telephone Encounter (Signed)
FYI- Patient called to advise she received the form on Saturday and she will bring it by the office by lunchtime today.

## 2022-06-01 NOTE — Telephone Encounter (Signed)
Patient brought in LIS / [employer] denial letter. Faxed application and letter [employer] application

## 2022-06-03 ENCOUNTER — Inpatient Hospital Stay (HOSPITAL_BASED_OUTPATIENT_CLINIC_OR_DEPARTMENT_OTHER): Payer: PPO | Admitting: Medical Oncology

## 2022-06-03 ENCOUNTER — Ambulatory Visit: Payer: PPO | Admitting: Hematology & Oncology

## 2022-06-03 ENCOUNTER — Inpatient Hospital Stay: Payer: PPO | Attending: Family

## 2022-06-03 VITALS — BP 108/89 | HR 88 | Temp 98.1°F | Resp 17 | Ht 66.0 in | Wt 195.0 lb

## 2022-06-03 DIAGNOSIS — D6959 Other secondary thrombocytopenia: Secondary | ICD-10-CM | POA: Diagnosis not present

## 2022-06-03 DIAGNOSIS — R0602 Shortness of breath: Secondary | ICD-10-CM | POA: Insufficient documentation

## 2022-06-03 DIAGNOSIS — E876 Hypokalemia: Secondary | ICD-10-CM | POA: Insufficient documentation

## 2022-06-03 DIAGNOSIS — R635 Abnormal weight gain: Secondary | ICD-10-CM | POA: Diagnosis not present

## 2022-06-03 DIAGNOSIS — Z79899 Other long term (current) drug therapy: Secondary | ICD-10-CM | POA: Diagnosis not present

## 2022-06-03 DIAGNOSIS — K7581 Nonalcoholic steatohepatitis (NASH): Secondary | ICD-10-CM | POA: Diagnosis not present

## 2022-06-03 DIAGNOSIS — R161 Splenomegaly, not elsewhere classified: Secondary | ICD-10-CM | POA: Diagnosis not present

## 2022-06-03 DIAGNOSIS — R1084 Generalized abdominal pain: Secondary | ICD-10-CM | POA: Diagnosis not present

## 2022-06-03 DIAGNOSIS — D500 Iron deficiency anemia secondary to blood loss (chronic): Secondary | ICD-10-CM

## 2022-06-03 DIAGNOSIS — I83892 Varicose veins of left lower extremities with other complications: Secondary | ICD-10-CM

## 2022-06-03 DIAGNOSIS — D696 Thrombocytopenia, unspecified: Secondary | ICD-10-CM

## 2022-06-03 DIAGNOSIS — R140 Abdominal distension (gaseous): Secondary | ICD-10-CM | POA: Insufficient documentation

## 2022-06-03 DIAGNOSIS — R109 Unspecified abdominal pain: Secondary | ICD-10-CM | POA: Insufficient documentation

## 2022-06-03 LAB — CBC WITH DIFFERENTIAL (CANCER CENTER ONLY)
Abs Immature Granulocytes: 0.06 10*3/uL (ref 0.00–0.07)
Basophils Absolute: 0 10*3/uL (ref 0.0–0.1)
Basophils Relative: 1 %
Eosinophils Absolute: 0 10*3/uL (ref 0.0–0.5)
Eosinophils Relative: 1 %
HCT: 37.2 % (ref 36.0–46.0)
Hemoglobin: 12.6 g/dL (ref 12.0–15.0)
Immature Granulocytes: 1 %
Lymphocytes Relative: 20 %
Lymphs Abs: 1.3 10*3/uL (ref 0.7–4.0)
MCH: 28.3 pg (ref 26.0–34.0)
MCHC: 33.9 g/dL (ref 30.0–36.0)
MCV: 83.4 fL (ref 80.0–100.0)
Monocytes Absolute: 0.6 10*3/uL (ref 0.1–1.0)
Monocytes Relative: 9 %
Neutro Abs: 4.4 10*3/uL (ref 1.7–7.7)
Neutrophils Relative %: 68 %
Platelet Count: 58 10*3/uL — ABNORMAL LOW (ref 150–400)
RBC: 4.46 MIL/uL (ref 3.87–5.11)
RDW: 16.1 % — ABNORMAL HIGH (ref 11.5–15.5)
WBC Count: 6.5 10*3/uL (ref 4.0–10.5)
nRBC: 0 % (ref 0.0–0.2)

## 2022-06-03 LAB — RETICULOCYTES
Immature Retic Fract: 14 % (ref 2.3–15.9)
RBC.: 4.42 MIL/uL (ref 3.87–5.11)
Retic Count, Absolute: 99 10*3/uL (ref 19.0–186.0)
Retic Ct Pct: 2.2 % (ref 0.4–3.1)

## 2022-06-03 LAB — CMP (CANCER CENTER ONLY)
ALT: 13 U/L (ref 0–44)
AST: 22 U/L (ref 15–41)
Albumin: 4.5 g/dL (ref 3.5–5.0)
Alkaline Phosphatase: 55 U/L (ref 38–126)
Anion gap: 9 (ref 5–15)
BUN: 16 mg/dL (ref 8–23)
CO2: 34 mmol/L — ABNORMAL HIGH (ref 22–32)
Calcium: 9.9 mg/dL (ref 8.9–10.3)
Chloride: 96 mmol/L — ABNORMAL LOW (ref 98–111)
Creatinine: 0.79 mg/dL (ref 0.44–1.00)
GFR, Estimated: >60 mL/min (ref >60)
Glucose, Bld: 149 mg/dL — ABNORMAL HIGH (ref 70–99)
Potassium: 3.2 mmol/L — ABNORMAL LOW (ref 3.5–5.1)
Sodium: 139 mmol/L (ref 135–145)
Total Bilirubin: 1.2 mg/dL (ref 0.3–1.2)
Total Protein: 7.5 g/dL (ref 6.5–8.1)

## 2022-06-03 LAB — IRON AND IRON BINDING CAPACITY (CC-WL,HP ONLY)
Iron: 90 ug/dL (ref 28–170)
Saturation Ratios: 19 % (ref 10.4–31.8)
TIBC: 487 ug/dL — ABNORMAL HIGH (ref 250–450)
UIBC: 397 ug/dL (ref 148–442)

## 2022-06-03 LAB — FERRITIN: Ferritin: 16 ng/mL (ref 11–307)

## 2022-06-03 NOTE — Progress Notes (Signed)
Hematology and Oncology Follow Up Visit  Rachael Buck [id number] 05/28/1941 81 y.o. 06/03/2022   Principle Diagnosis:  Thrombocytopenia -- NASH/Splenomegaly ; Immune based thrombocytopenia Iron deficiency    Current Therapy:        Nplate as indicated  IV iron as indicated  -- Feraheme given on -12/15/2021    Interim History:  Rachael Buck is here today for follow-up.  Overall she reports that she does not feel as well as her last follow up. She recently lost her aunt who raised her- this was emotionally tough on her. She feels that some of her recent weight gain may be due to this but is concerned as she gained 2 pounds overnight.   NASH: She has found more SOB, weight gain, abdominal pain and distension over the past month. No peripheral edema, chest pain, jaundice.  Wt Readings from Last 3 Encounters:  06/03/22 195 lb (88.5 kg)  04/08/22 188 lb 12.8 oz (85.6 kg)  01/21/22 185 lb (83.9 kg)   Thrombocytopenia: No new bleeding or bruising episodes. No hemoptysis, melena, hematuria.   IDA: Last dose of Feraheme was on 12/15/2021  Medications:  Allergies as of 06/03/2022       Reactions   Codeine Nausea And Vomiting   Metformin And Related Diarrhea   Shrimp [shellfish Allergy] Swelling        Medication List        Accurate as of June 03, 2022 11:30 AM. If you have any questions, ask your nurse or doctor.          acetaminophen 500 MG tablet Commonly known as: TYLENOL Take 500 mg by mouth every 6 (six) hours as needed for mild pain.   albuterol 108 (90 Base) MCG/ACT inhaler Commonly known as: ProAir HFA Inhale 2 puffs into the lungs every 4 (four) hours as needed for wheezing or shortness of breath.   ALPRAZolam 0.5 MG tablet Commonly known as: XANAX TAKE 1 TABLET(0.5 MG) BY MOUTH THREE TIMES DAILY   blood glucose meter kit and supplies Kit Dispense based on patient and insurance preference. Use up to four times daily as directed.   colestipol 1 g  tablet Commonly known as: COLESTID   diphenhydrAMINE 25 MG tablet Commonly known as: BENADRYL Take 25 mg by mouth every 6 (six) hours as needed (for allergies.).   glipiZIDE 5 MG 24 hr tablet Commonly known as: GLUCOTROL XL TAKE 1 TABLET(5 MG) BY MOUTH DAILY WITH BREAKFAST   hydrochlorothiazide 25 MG tablet Commonly known as: HYDRODIURIL TAKE 1 TABLET(25 MG) BY MOUTH DAILY   hydrocortisone 2.5 % cream Apply 1 application topically daily as needed (itchy dry skin).   ipratropium-albuterol 0.5-2.5 (3) MG/3ML Soln Commonly known as: DUONEB   Jardiance 10 MG Tabs tablet Generic drug: empagliflozin TAKE 1 TABLET(10 MG) BY MOUTH DAILY   lisinopril 20 MG tablet Commonly known as: ZESTRIL Take 1 tablet (20 mg total) by mouth daily.   loperamide 2 MG capsule Commonly known as: IMODIUM Take 4-6 mg by mouth daily as needed (for IBS symptoms).   losartan 50 MG tablet Commonly known as: COZAAR   Melatonin 10 MG Tabs Take 10 mg by mouth at bedtime.   omeprazole 40 MG capsule Commonly known as: PRILOSEC TK 1 C PO QD   OneTouch Delica Lancets Fine Misc 1 each by Other route daily.   OneTouch Ultra test strip Generic drug: glucose blood TEST BLOOD SUGAR(S) ONCE DAILY IN THE MORNING   sertraline 100 MG tablet Commonly known  as: ZOLOFT TAKE 1 TABLET(100 MG) BY MOUTH DAILY   simvastatin 80 MG tablet Commonly known as: ZOCOR TAKE 1 TABLET(80 MG) BY MOUTH DAILY   Stiolto Respimat 2.5-2.5 MCG/ACT Aers Generic drug: Tiotropium Bromide-Olodaterol Inhale 2 puffs into the lungs daily.   sucralfate 1 g tablet Commonly known as: CARAFATE Take 1 g by mouth 4 (four) times daily as needed.   traMADol 50 MG tablet Commonly known as: ULTRAM TAKE 1 TABLET(50 MG) BY MOUTH EVERY 6 HOURS AS NEEDED        Allergies:  Allergies  Allergen Reactions   Codeine Nausea And Vomiting   Metformin And Related Diarrhea   Shrimp [Shellfish Allergy] Swelling    Past Medical History,  Surgical history, Social history, and Family History were reviewed and updated.  Review of Systems: Review of Systems  Constitutional:  Positive for malaise/fatigue.  HENT: Negative.    Eyes: Negative.   Respiratory:  Positive for shortness of breath.   Cardiovascular: Negative.   Gastrointestinal:  Positive for abdominal pain.  Genitourinary: Negative.   Musculoskeletal: Negative.   Skin: Negative.   Neurological: Negative.   Endo/Heme/Allergies: Negative.   Psychiatric/Behavioral: Negative.       Physical Exam:  height is 5' 6"  (1.676 m) and weight is 195 lb (88.5 kg). Her oral temperature is 98.1 F (36.7 C). Her blood pressure is 108/89 and her pulse is 88. Her respiration is 17 and oxygen saturation is 94%.   Wt Readings from Last 3 Encounters:  06/03/22 195 lb (88.5 kg)  04/08/22 188 lb 12.8 oz (85.6 kg)  01/21/22 185 lb (83.9 kg)    Physical Exam Vitals reviewed.  HENT:     Head: Normocephalic and atraumatic.  Eyes:     General: No scleral icterus.    Pupils: Pupils are equal, round, and reactive to light.  Cardiovascular:     Rate and Rhythm: Normal rate and regular rhythm.     Heart sounds: Normal heart sounds.  Pulmonary:     Effort: Pulmonary effort is normal.     Breath sounds: Normal breath sounds.  Abdominal:     General: Bowel sounds are normal. There is distension (mild to moderate).     Palpations: Abdomen is soft. There is no mass.     Tenderness: There is no abdominal tenderness. There is no guarding or rebound.     Hernia: No hernia is present.  Musculoskeletal:        General: No tenderness or deformity. Normal range of motion.     Cervical back: Normal range of motion.     Right lower leg: No edema.     Left lower leg: No edema.  Lymphadenopathy:     Cervical: No cervical adenopathy.  Skin:    General: Skin is warm and dry.     Coloration: Skin is not jaundiced or pale.     Findings: No erythema or rash.  Neurological:     Mental  Status: She is alert and oriented to person, place, and time.  Psychiatric:        Behavior: Behavior normal.        Thought Content: Thought content normal.        Judgment: Judgment normal.      Lab Results  Component Value Date   WBC 6.5 06/03/2022   HGB 12.6 06/03/2022   HCT 37.2 06/03/2022   MCV 83.4 06/03/2022   PLT 58 (L) 06/03/2022   Lab Results  Component Value Date  FERRITIN 17 04/08/2022   IRON 111 04/08/2022   TIBC 497 (H) 04/08/2022   UIBC 386 04/08/2022   IRONPCTSAT 22 04/08/2022   Lab Results  Component Value Date   RETICCTPCT 2.2 06/03/2022   RBC 4.46 06/03/2022   RBC 4.42 06/03/2022   No results found for: "KPAFRELGTCHN", "LAMBDASER", "KAPLAMBRATIO" No results found for: "IGGSERUM", "IGA", "IGMSERUM" No results found for: "TOTALPROTELP", "ALBUMINELP", "A1GS", "A2GS", "BETS", "BETA2SER", "GAMS", "MSPIKE", "SPEI"   Chemistry      Component Value Date/Time   NA 139 06/03/2022 1011   NA 127 (L) 02/14/2017 1335   K 3.2 (L) 06/03/2022 1011   K 3.6 02/14/2017 1335   CL 96 (L) 06/03/2022 1011   CL 92 (L) 02/14/2017 1335   CO2 34 (H) 06/03/2022 1011   CO2 28 02/14/2017 1335   BUN 16 06/03/2022 1011   BUN 7 (L) 02/14/2017 1335   CREATININE 0.79 06/03/2022 1011   CREATININE 0.47 (L) 02/14/2017 1335   CREATININE 0.56 07/24/2014 1204      Component Value Date/Time   CALCIUM 9.9 06/03/2022 1011   CALCIUM 9.3 02/14/2017 1335   ALKPHOS 55 06/03/2022 1011   ALKPHOS 68 02/14/2017 1335   AST 22 06/03/2022 1011   ALT 13 06/03/2022 1011   BILITOT 1.2 06/03/2022 1011     Encounter Diagnoses  Name Primary?   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  NASH: Acute on chronic.  Increasingly symptomatic. CT chest/ABD/pelvis w/ contrast being ordered to further evaluate. Referral to new GI being placed per pt request. RTC within 1 week of CT scan to discuss results  IDA: Chronic. Stable with hemoglobin of 12.6 today. We will continue to monitor.   SOB: Acute on chronic. Mild. Thought to be pulmonary in origin by PCP- being seen by Pulmonology and has upcoming PFTs scheduled. May also be related to her NASH- CT pending.   ABD Pain: Chronic and worsened- likely secondary to free fluid accumulation- may need paracentesis vs diuretics depending on CT results. Discussed red flags with patient. GI referral placed  Weight Gain: New. Per patient may be due to mourning the loss of her aunt vs free fluid accumulation. CT pending. Will monitor closely.    Hypokalemia: New. She will increase oral potassium intake which we discussed.   Disposition: Increase potassium intake CT Chest/ABD/Pelvis W/ pending RTC within 7 days of CT scan to go over results RTC 1 month MD, labs (CBC W/, CMP, retic, ferritin, iron/TIBC) +_ Nplate  Hughie Closs, PA-C 10/5/202311:30 AM

## 2022-06-08 NOTE — Progress Notes (Unsigned)
[facility] [address] [place], [place] [address] [phone]  Date:  06/09/2022   Name:  Rachael Buck   DOB:  [birth year]   MRN:  [id number]  PCP:  [name], MD    Chief Complaint: medication Check (Concerns/ questions: 1. 02 has been dropping in the high 80's. 2. Pain./Flu shot today: yes/Shingles/Tdap: due/Mammogram: due/Eye exam: will schedule/Foot exam and A1C due today)   History of Present Illness:  Rachael Buck is a 81 y.o. very pleasant female patient who presents with the following:  Patient seen today for follow-up/recheck Most recent visit with myself was in [place] that time she and her husband both had COVID-19.  Her husband [name] has memory loss, this is a significant strain on [place] seen by myself 10/22- History of diabetes, hypertension, IBS, [place], cirrhosis, hyperlipidemia, thrombocytopenia  Our pharmacist Tammy is helping with her diabetes control She was seen by Dr. Marin Olp in August; he is helping follow her immune-based thrombocytopenia and iron deficiency  Due for urine microalbumin Shingrix Tetanus booster COVID vaccine Eye exam- she will do this  Hemoglobin A1c Foot exam Flu shot- give today  Mammogram- she will self- schedule this   She does have 2 kids who are nearly and offer a lot of support   Lab Results  Component Value Date   HGBA1C 6.9 (H) 04/01/2021   She was seen by Hematology/ oncology last week  She is suppposed to get a CT scan of her chest abdomen pelvis but this is not scheduled yet.  I asked her to please stop by the imaging department today and get this set up  Pt notes her oxygen level is dropping sometimes when she walks.  The lowest number she got was 88-after walking.  This returns to the 90s with rest She is not sure how long this has been going on - maybe for a couple of weeks. She quit smoking years ago She is seeing her  pulmonologist in about 2 weeks - DR Glennie Hawk follow-up of COPD and asthma She does not use oxygen at home at this time She does not currently feel short of breath.  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   Thrombocytopenia ([place]) 02/15/2017   Varicose veins of left lower extremity with complications 44/08/270   Age-related cognitive decline 06/02/2015   Fatigue 07/19/2014   Orthostatic hypotension 05/14/2013   Nail abnormality 09/04/2012   Bleeding disorder ([place]) 05/08/2012   Varices, 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   NASH (nonalcoholic steatohepatitis) 03/21/2012   HTN (hypertension) 12/21/2011   Depression 03/28/2011   IBS (irritable bowel syndrome) 03/23/2011   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[place]) 08/27/2010   DYSPNEA ON EXERTION 07/27/2010   Uncontrolled diabetes mellitus 07/24/2010   MIXED HYPERLIPIDEMIA 07/24/2010   Obesity, unspecified 07/24/2010   ALLERGIC RHINITIS CAUSE UNSPECIFIED 07/24/2010   REFLUX ESOPHAGITIS 07/24/2010   PAIN IN JOINT PELVIC REGION AND THIGH 07/24/2010    Past Medical History:  Diagnosis Date   Anxiety    Arthritis    Asthma    Cirrhosis, nonalcoholic ([place])    Colon polyps    Diabetes mellitus  Diverticulitis    GERD (gastroesophageal reflux disease)    Hyperlipidemia    Hypertension    NASH (nonalcoholic steatohepatitis)    Obesity    Pneumonia     Past Surgical History:  Procedure Laterality Date   CHOLECYSTECTOMY  1981   ENDOVENOUS ABLATION SAPHENOUS VEIN W/ LASER Left 02/21/2018   endovenous laser ablation left greater saphenous vein by Tinnie Gens MD    [place]     bilateral. 2006, 2008    TOTAL KNEE ARTHROPLASTY Left 02/18/2017   TOTAL KNEE ARTHROPLASTY Left 02/18/2017   Procedure: LEFT TOTAL KNEE ARTHROPLASTY;  Surgeon: Netta Cedars, MD;  Location: [facility];  Service: Orthopedics;  Laterality: Left;   VEIN SURGERY      Social History   Tobacco Use   Smoking status: Former    Packs/day: 1.50    Years: 15.00    Total pack years: 22.50    Types: Cigarettes   Smokeless tobacco: Never   Tobacco comments:    Quit 2002.  Vaping Use   Vaping Use: Never used  Substance Use Topics   Alcohol use: No   Drug use: No    Family History  Problem Relation Age of Onset   Stomach cancer Mother    Diabetes Sister    Breast cancer Daughter 23   Breast cancer Maternal Aunt    Diabetes Brother    Cancer Other    GER disease Other    Obesity Other     Allergies  Allergen Reactions   Codeine Nausea And Vomiting   Metformin And Related Diarrhea   Shrimp [Shellfish Allergy] Swelling    Medication list has been reviewed and updated.  Current Outpatient Medications on File Prior to Visit  Medication Sig Dispense Refill   acetaminophen (TYLENOL) 500 MG tablet Take 500 mg by mouth every 6 (six) hours as needed for mild pain.     albuterol (PROAIR HFA) 108 (90 Base) MCG/ACT inhaler Inhale 2 puffs into the lungs every 4 (four) hours as needed for wheezing or shortness of breath. 1 Inhaler 6   ALPRAZolam (XANAX) 0.5 MG tablet TAKE 1 TABLET(0.5 MG) BY MOUTH THREE TIMES DAILY 90 tablet 0   blood glucose meter kit and supplies KIT Dispense based on patient and insurance preference. Use up to four times daily as directed. 1 each 0   colestipol (COLESTID) 1 g tablet      diphenhydrAMINE (BENADRYL) 25 MG tablet Take 25 mg by mouth every 6 (six) hours as needed (for allergies.).     empagliflozin (JARDIANCE) 10 MG TABS tablet TAKE 1 TABLET(10 MG) BY MOUTH DAILY 90 tablet 1   glipiZIDE (GLUCOTROL XL) 5 MG 24 hr tablet TAKE 1 TABLET(5 MG) BY MOUTH DAILY WITH BREAKFAST 90 tablet 3    hydrochlorothiazide (HYDRODIURIL) 25 MG tablet TAKE 1 TABLET(25 MG) BY MOUTH DAILY 90 tablet 1   hydrocortisone 2.5 % cream Apply 1 application topically daily as needed (itchy dry skin).     ipratropium-albuterol (DUONEB) 0.5-2.5 (3) MG/3ML SOLN      lisinopril (ZESTRIL) 20 MG tablet Take 1 tablet (20 mg total) by mouth daily. 90 tablet 3   loperamide (IMODIUM) 2 MG capsule Take 4-6 mg by mouth daily as needed (for IBS symptoms).     losartan (COZAAR) 50 MG tablet      Melatonin 10 MG TABS Take 10 mg by mouth at bedtime.     omeprazole (  PRILOSEC) 40 MG capsule TK 1 C PO QD     ONETOUCH DELICA LANCETS FINE MISC 1 each by Other route daily. 100 each 12   ONETOUCH ULTRA test strip TEST BLOOD SUGAR(S) ONCE DAILY IN THE MORNING 100 strip 12   sertraline (ZOLOFT) 100 MG tablet TAKE 1 TABLET(100 MG) BY MOUTH DAILY 90 tablet 1   simvastatin (ZOCOR) 80 MG tablet TAKE 1 TABLET(80 MG) BY MOUTH DAILY 90 tablet 1   sucralfate (CARAFATE) 1 g tablet Take 1 g by mouth 4 (four) times daily as needed.     Tiotropium Bromide-Olodaterol (STIOLTO RESPIMAT) 2.5-2.5 MCG/ACT AERS Inhale 2 puffs into the lungs daily. 4 g 0   traMADol (ULTRAM) 50 MG tablet TAKE 1 TABLET(50 MG) BY MOUTH EVERY 6 HOURS AS NEEDED 60 tablet 0   No current facility-administered medications on file prior to visit.    Review of Systems:  As per HPI- otherwise negative.   Physical Examination: Vitals:   06/09/22 1138  BP: 124/80  Pulse: 83  Resp: 18  Temp: 98.1 F (36.7 C)  SpO2: 93%   Vitals:   06/09/22 1138  Weight: 192 lb 12.8 oz (87.5 kg)  Height: 5' 6"  (1.676 m)   Body mass index is 31.12 kg/m. Ideal Body Weight: Weight in (lb) to have BMI = 25: 154.6  GEN: no acute distress.  Mildly obese, looks well HEENT: Atraumatic, Normocephalic.  Ears and Nose: No external deformity. CV: RRR, No M/G/R. No JVD. No thrill. No extra heart sounds. PULM: CTA B, no wheezes, crackles, rhonchi. No retractions. No resp. distress. No  accessory muscle use. ABD: S, NT, +BS. No rebound.  Abdomen slightly distended-known history of splenomegaly EXTR: No c/c/e PSYCH: Normally interactive. Conversant.   Oxygen drops to 88% with walking about 100 yards today in the office.  Returns to mid 90s with rest Assessment and Plan: Oxygen desaturation - Plan: DG Chest 2 View, B Nat Peptide  Controlled type 2 diabetes mellitus without complication, without long-term current use of insulin (HCC) - Plan: Microalbumin / creatinine urine ratio, glucose blood (ONETOUCH ULTRA) test strip, blood glucose meter kit and supplies KIT, CANCELED: Hemoglobin A1c  Thrombocytopenia (HCC)  Essential hypertension  Mixed hyperlipidemia - Plan: Lipid panel  Weight gain - Plan: TSH, B Nat Peptide  Need for influenza vaccination - Plan: Flu Vaccine QUAD High Dose(Fluad)  Patient seen today for follow-up.  She has noted oxygen desaturation with exercise.  Discussed this with her, advised that home oxygen may be helpful for her.  She is not very enthusiastic about this idea.  She is seeing her pulmonologist in a couple of weeks, I sent a message to Dr. Lamonte Sakai and he plans to follow-up with her at her upcoming visit I also will obtain a BNP and chest x-ray today Follow-up on diabetes-patient reports she very recently had an A1c, she reports it was 6.9% per her recollection. Follow-up on lipids Blood pressure well controlled  Signed Lamar Blinks, MD  Received labs and chest x-ray as below, message to patient Results for orders placed or performed in visit on 06/09/22  Lipid panel  Result Value Ref Range   Cholesterol 137 0 - 200 mg/dL   Triglycerides 330.0 (H) 0.0 - 149.0 mg/dL   HDL 52.40 >39.00 mg/dL   VLDL 66.0 (H) 0.0 - 40.0 mg/dL   Total CHOL/HDL Ratio 3    NonHDL 84.66   TSH  Result Value Ref Range   TSH 2.41 0.35 - 5.50 uIU/mL  Microalbumin / creatinine urine ratio  Result Value Ref Range   Microalb, Ur 1.1 0.0 - 1.9 mg/dL    Creatinine,U 31.4 mg/dL   Microalb Creat Ratio 3.6 0.0 - 30.0 mg/g  B Nat Peptide  Result Value Ref Range   Pro B Natriuretic peptide (BNP) 16.0 0.0 - 100.0 pg/mL  LDL cholesterol, direct  Result Value Ref Range   Direct LDL 61.0 mg/dL    DG Chest 2 View  Result Date: 06/09/2022 CLINICAL DATA:  Oxygen desaturation. EXAM: CHEST - 2 VIEW COMPARISON:  Chest two views 10/12/2021 FINDINGS: Cardiac silhouette and mediastinal contours are within normal limits. Mild calcification within aortic arch. The lungs are clear. No pleural effusion or pneumothorax. Mild multilevel disc space narrowing of the thoracic spine. IMPRESSION: No active cardiopulmonary disease. Electronically Signed   By: Yvonne Kendall M.D.   On: 06/09/2022 12:50

## 2022-06-09 ENCOUNTER — Ambulatory Visit (HOSPITAL_BASED_OUTPATIENT_CLINIC_OR_DEPARTMENT_OTHER)
Admission: RE | Admit: 2022-06-09 | Discharge: 2022-06-09 | Disposition: A | Discharge status: Home / Self Care | Payer: PPO | Source: Ambulatory Visit | Attending: Family Medicine | Admitting: Family Medicine

## 2022-06-09 ENCOUNTER — Encounter: Payer: Self-pay | Admitting: Family Medicine

## 2022-06-09 ENCOUNTER — Ambulatory Visit (INDEPENDENT_AMBULATORY_CARE_PROVIDER_SITE_OTHER): Payer: PPO | Admitting: Family Medicine

## 2022-06-09 ENCOUNTER — Telehealth: Payer: Self-pay | Admitting: Emergency Medicine

## 2022-06-09 VITALS — BP 124/80 | HR 83 | Temp 98.1°F | Resp 18 | Ht 66.0 in | Wt 192.8 lb

## 2022-06-09 DIAGNOSIS — I10 Essential (primary) hypertension: Secondary | ICD-10-CM

## 2022-06-09 DIAGNOSIS — R0902 Hypoxemia: Secondary | ICD-10-CM

## 2022-06-09 DIAGNOSIS — I700 Atherosclerosis of aorta: Secondary | ICD-10-CM | POA: Diagnosis not present

## 2022-06-09 DIAGNOSIS — E119 Type 2 diabetes mellitus without complications: Secondary | ICD-10-CM

## 2022-06-09 DIAGNOSIS — Z23 Encounter for immunization: Secondary | ICD-10-CM | POA: Diagnosis not present

## 2022-06-09 DIAGNOSIS — E782 Mixed hyperlipidemia: Secondary | ICD-10-CM | POA: Diagnosis not present

## 2022-06-09 DIAGNOSIS — R635 Abnormal weight gain: Secondary | ICD-10-CM | POA: Diagnosis not present

## 2022-06-09 DIAGNOSIS — D696 Thrombocytopenia, unspecified: Secondary | ICD-10-CM

## 2022-06-09 LAB — MICROALBUMIN / CREATININE URINE RATIO
Creatinine,U: 31.4 mg/dL
Microalb Creat Ratio: 3.6 mg/g (ref 0.0–30.0)
Microalb, Ur: 1.1 mg/dL (ref 0.0–1.9)

## 2022-06-09 LAB — LIPID PANEL
Cholesterol: 137 mg/dL (ref 0–200)
HDL: 52.4 mg/dL (ref >39.00)
NonHDL: 84.66
Total CHOL/HDL Ratio: 3
Triglycerides: 330 mg/dL — ABNORMAL HIGH (ref 0.0–149.0)
VLDL: 66 mg/dL — ABNORMAL HIGH (ref 0.0–40.0)

## 2022-06-09 LAB — TSH: TSH: 2.41 u[IU]/mL (ref 0.35–5.50)

## 2022-06-09 LAB — LDL CHOLESTEROL, DIRECT: Direct LDL: 61 mg/dL

## 2022-06-09 LAB — BRAIN NATRIURETIC PEPTIDE: Pro B Natriuretic peptide (BNP): 16 pg/mL (ref 0.0–100.0)

## 2022-06-09 MED ORDER — ONETOUCH ULTRA VI STRP: ORAL_STRIP | 12 refills | Status: DC

## 2022-06-09 MED ORDER — BLOOD GLUCOSE MONITOR KIT: PACK | 0 refills | Status: DC

## 2022-06-09 NOTE — Patient Instructions (Addendum)
It was good to see you today-  Please stop by the lab and then go to imaging on the ground floor for your chest x-ray, and schedule your CT scan  Flu shot given today I will let Dr Lamonte Sakai know your oxygen is dropping with walking   Please let me know if your breathing is getting worse or if any other concerns

## 2022-06-09 NOTE — Telephone Encounter (Signed)
Need to set up pt for an ROV. She has seen some desats, will need a repeat walk

## 2022-06-10 NOTE — Telephone Encounter (Signed)
ATC LVMTCB x 1  

## 2022-06-15 ENCOUNTER — Encounter: Payer: PPO | Admitting: Pharmacist

## 2022-06-17 ENCOUNTER — Ambulatory Visit (HOSPITAL_BASED_OUTPATIENT_CLINIC_OR_DEPARTMENT_OTHER)
Admission: RE | Admit: 2022-06-17 | Discharge: 2022-06-17 | Disposition: A | Discharge status: Home / Self Care | Payer: PPO | Source: Ambulatory Visit | Attending: Medical Oncology | Admitting: Medical Oncology

## 2022-06-17 DIAGNOSIS — Z9049 Acquired absence of other specified parts of digestive tract: Secondary | ICD-10-CM | POA: Diagnosis not present

## 2022-06-17 DIAGNOSIS — K7460 Unspecified cirrhosis of liver: Secondary | ICD-10-CM | POA: Diagnosis not present

## 2022-06-17 DIAGNOSIS — R0602 Shortness of breath: Secondary | ICD-10-CM | POA: Insufficient documentation

## 2022-06-17 DIAGNOSIS — R161 Splenomegaly, not elsewhere classified: Secondary | ICD-10-CM | POA: Diagnosis not present

## 2022-06-17 DIAGNOSIS — R635 Abnormal weight gain: Secondary | ICD-10-CM | POA: Diagnosis not present

## 2022-06-17 DIAGNOSIS — K7581 Nonalcoholic steatohepatitis (NASH): Secondary | ICD-10-CM | POA: Diagnosis not present

## 2022-06-17 DIAGNOSIS — R1084 Generalized abdominal pain: Secondary | ICD-10-CM | POA: Diagnosis not present

## 2022-06-17 DIAGNOSIS — Z90710 Acquired absence of both cervix and uterus: Secondary | ICD-10-CM | POA: Diagnosis not present

## 2022-06-17 DIAGNOSIS — I700 Atherosclerosis of aorta: Secondary | ICD-10-CM | POA: Diagnosis not present

## 2022-06-17 MED ORDER — IOHEXOL 300 MG/ML  SOLN
100.0000 mL | Freq: Once | INTRAMUSCULAR | Status: AC | PRN
  Administered 2022-06-17: 100 mL via INTRAVENOUS

## 2022-06-17 NOTE — Progress Notes (Signed)
This encounter was created in error - please disregard.

## 2022-06-18 ENCOUNTER — Other Ambulatory Visit: Payer: Self-pay | Admitting: Hematology & Oncology

## 2022-06-18 DIAGNOSIS — M545 Low back pain, unspecified: Secondary | ICD-10-CM

## 2022-06-18 DIAGNOSIS — M25559 Pain in unspecified hip: Secondary | ICD-10-CM

## 2022-06-24 ENCOUNTER — Ambulatory Visit: Payer: PPO | Admitting: Emergency Medicine

## 2022-06-24 ENCOUNTER — Encounter: Payer: Self-pay | Admitting: Emergency Medicine

## 2022-06-24 DIAGNOSIS — J4489 Other specified chronic obstructive pulmonary disease: Secondary | ICD-10-CM | POA: Diagnosis not present

## 2022-06-24 DIAGNOSIS — R0902 Hypoxemia: Secondary | ICD-10-CM | POA: Diagnosis not present

## 2022-06-24 NOTE — Addendum Note (Signed)
Addended by: Loma Sousa on: 06/24/2022 01:54 PM   Modules accepted: Orders

## 2022-06-24 NOTE — Telephone Encounter (Signed)
Pt scheduled for OV today 06/24/22. Nothing further needed at this time.

## 2022-06-24 NOTE — Progress Notes (Signed)
Subjective:    Patient ID: Rachael Buck, female    DOB: May 20, 1941, 81 y.o.   MRN: [id number]  HPI  ROV 06/24/2022 --follow-up visit for 81 year old woman, former smoker, with a history of asthma/obstructive disease, superimposed restriction.  Also with a history of hypertension, [place] cirrhosis, diabetes, splenomegaly.  She has upper airway instability and stridor.  She had COVID-19 in February 2023.  She is followed by Dr. Lorelei Pont who notified me that she has been experiencing some exertional desaturations.  Currently managed on occasional albuterol, has not been using DuoNeb.  She is of [place]. She has had some exertional weakness and SOB, has seen some exertional desaturation with walking through the house.    Review of Systems As per HPI  Past Medical History:  Diagnosis Date   Anxiety    Arthritis    Asthma    Cirrhosis, nonalcoholic ([place])    Colon polyps    Diabetes mellitus    Diverticulitis    GERD (gastroesophageal reflux disease)    Hyperlipidemia    Hypertension    NASH (nonalcoholic steatohepatitis)    Obesity    Pneumonia      Family History  Problem Relation Age of Onset   Stomach cancer Mother    Diabetes Sister    Breast cancer Daughter 15   Breast cancer Maternal Aunt    Diabetes Brother    Cancer Other    GER disease Other    Obesity Other      Social History   Socioeconomic History   Marital status: Married    Spouse name: Not on file   Number of children: Not on file   Years of education: Not on file   Highest education level: Not on file  Occupational History   Not on file  Tobacco Use   Smoking status: Former    Packs/day: 1.50    Years: 15.00    Total pack years: 22.50    Types: Cigarettes   Smokeless tobacco: Never   Tobacco comments:    Quit 2002.  Vaping Use   Vaping Use: Never used  Substance and Sexual Activity   Alcohol use: No   Drug use: No   Sexual activity: Not on file  Other Topics Concern   Not on file   Social History Narrative   Not on file   Social Determinants of Health   Financial Resource Strain: Low Risk  (12/29/2021)   Overall Financial Resource Strain (CARDIA)    Difficulty of Paying Living Expenses: Not hard at all  Food Insecurity: No Food Insecurity (03/29/2022)   Hunger Vital Sign    Worried About Running Out of Food in the Last Year: Never true    Ran Out of Food in the Last Year: Never true  Transportation Needs: No Transportation Needs (03/29/2022)   PRAPARE - Hydrologist (Medical): No    Lack of Transportation (Non-Medical): No  Physical Activity: Inactive (12/29/2021)   Exercise Vital Sign    Days of Exercise per Week: 0 days    Minutes of Exercise per Session: 0 min  Stress: No Stress Concern Present (12/29/2021)   [place]    Feeling of Stress : Not at all  Social Connections: Moderately Integrated (12/29/2021)   Social Connection and Isolation Panel [NHANES]    Frequency of Communication with Friends and Family: More than three times a week    Frequency of Social Gatherings  with Friends and Family: Once a week    Attends Religious Services: More than 4 times per year    Active Member of Clubs or Organizations: No    Attends Archivist Meetings: Never    Marital Status: Married  Human resources officer Violence: Not At Risk (03/29/2022)   Humiliation, Afraid, Rape, and Kick questionnaire    Fear of Current or Ex-Partner: No    Emotionally Abused: No    Physically Abused: No    Sexually Abused: No     Allergies  Allergen Reactions   Codeine Nausea And Vomiting   Metformin And Related Diarrhea   Shrimp [Shellfish Allergy] Swelling     Outpatient Medications Prior to Visit  Medication Sig Dispense Refill   acetaminophen (TYLENOL) 500 MG tablet Take 500 mg by mouth every 6 (six) hours as needed for mild pain.     albuterol (PROAIR HFA) 108 (90 Base) MCG/ACT  inhaler Inhale 2 puffs into the lungs every 4 (four) hours as needed for wheezing or shortness of breath. 1 Inhaler 6   ALPRAZolam (XANAX) 0.5 MG tablet TAKE 1 TABLET(0.5 MG) BY MOUTH THREE TIMES DAILY 90 tablet 0   blood glucose meter kit and supplies KIT Dispense based on patient and insurance preference. Use up to four times daily as directed. 1 each 0   blood glucose meter kit and supplies KIT Dispense based on patient and insurance preference. Use to check glucose once a day 1 each 0   colestipol (COLESTID) 1 g tablet      diphenhydrAMINE (BENADRYL) 25 MG tablet Take 25 mg by mouth every 6 (six) hours as needed (for allergies.).     empagliflozin (JARDIANCE) 10 MG TABS tablet TAKE 1 TABLET(10 MG) BY MOUTH DAILY 90 tablet 1   glipiZIDE (GLUCOTROL XL) 5 MG 24 hr tablet TAKE 1 TABLET(5 MG) BY MOUTH DAILY WITH BREAKFAST 90 tablet 3   glucose blood (ONETOUCH ULTRA) test strip TEST BLOOD SUGAR(S) ONCE DAILY IN THE MORNING 100 strip 12   hydrochlorothiazide (HYDRODIURIL) 25 MG tablet TAKE 1 TABLET(25 MG) BY MOUTH DAILY 90 tablet 1   hydrocortisone 2.5 % cream Apply 1 application topically daily as needed (itchy dry skin).     ipratropium-albuterol (DUONEB) 0.5-2.5 (3) MG/3ML SOLN      lisinopril (ZESTRIL) 20 MG tablet Take 1 tablet (20 mg total) by mouth daily. 90 tablet 3   loperamide (IMODIUM) 2 MG capsule Take 4-6 mg by mouth daily as needed (for IBS symptoms).     losartan (COZAAR) 50 MG tablet      Melatonin 10 MG TABS Take 10 mg by mouth at bedtime.     omeprazole (PRILOSEC) 40 MG capsule TK 1 C PO QD     ONETOUCH DELICA LANCETS FINE MISC 1 each by Other route daily. 100 each 12   sertraline (ZOLOFT) 100 MG tablet TAKE 1 TABLET(100 MG) BY MOUTH DAILY 90 tablet 1   simvastatin (ZOCOR) 80 MG tablet TAKE 1 TABLET(80 MG) BY MOUTH DAILY 90 tablet 1   sucralfate (CARAFATE) 1 g tablet Take 1 g by mouth 4 (four) times daily as needed.     Tiotropium Bromide-Olodaterol (STIOLTO RESPIMAT) 2.5-2.5  MCG/ACT AERS Inhale 2 puffs into the lungs daily. 4 g 0   traMADol (ULTRAM) 50 MG tablet TAKE 1 TABLET(50 MG) BY MOUTH EVERY 6 HOURS AS NEEDED 60 tablet 0   No facility-administered medications prior to visit.         Objective:   Physical  Exam Vitals:   06/24/22 1323  BP: 118/72  Pulse: 89  Temp: 98.4 F (36.9 C)  SpO2: 95%  Weight: 189 lb 3.2 oz (85.8 kg)  Height: 5' 6"  (1.676 m)   Gen: Pleasant, overweight woman, in no distress,  normal affect  ENT: No lesions,  mouth clear,  oropharynx clear, no postnasal drip  Neck: No JVD, no stridor  Lungs: No use of accessory muscles, no crackles or wheezing on normal respiration, no wheeze on forced expiration  Cardiovascular: RRR, heart sounds normal, no murmur or gallops, no peripheral edema  Musculoskeletal: No deformities, no cyanosis or clubbing  Neuro: alert, awake, non focal  Skin: Warm, no lesions or rash     Assessment & Plan:  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  Question whether there is some progressive restrictive disease from her [name], splenomegaly.  She is not having overt asthmatic symptoms or upper airway symptoms.  She does use albuterol a few times a week and does get benefit.  We will repeat her pulmonary function testing to compare with priors.  Based on this consider whether to restart her on scheduled BD therapy.  Hypoxemia She has had some witnessed desaturations at home with exertion, as low as 86-87% on room air.  We will perform a walking oximetry today to document.  She would be willing to consider supplemental oxygen if it were indicated (although she would like to avoid if possible).  Baltazar Apo, MD, PhD 06/24/2022, 1:42 PM [facility] [phone] or if no answer before 7:00PM call [phone] For any issues after 7:00PM please call  eLink [phone]

## 2022-06-24 NOTE — Assessment & Plan Note (Signed)
She has documented obstructive lung disease on pulmonary function testing from 2011, has not had PFTs since.  Question whether there is some progressive restrictive disease from her Rachael Buck, splenomegaly.  She is not having overt asthmatic symptoms or upper airway symptoms.  She does use albuterol a few times a week and does get benefit.  We will repeat her pulmonary function testing to compare with priors.  Based on this consider whether to restart her on scheduled BD therapy.

## 2022-06-24 NOTE — Assessment & Plan Note (Signed)
She has had some witnessed desaturations at home with exertion, as low as 86-87% on room air.  We will perform a walking oximetry today to document.  She would be willing to consider supplemental oxygen if it were indicated (although she would like to avoid if possible).

## 2022-06-24 NOTE — Patient Instructions (Signed)
We will perform walking oximetry today We will arrange for pulmonary function testing Keep your albuterol available to use 2 puffs to be needed for shortness of breath, chest tightness, wheezing. We will not start a scheduled bronchodilator inhaler at this time.  We may decide to do so going forward. Follow with Dr. Lamonte Sakai next available after your pulmonary function testing so we can review the results and decide next steps.

## 2022-06-28 ENCOUNTER — Other Ambulatory Visit: Payer: Self-pay | Admitting: Family Medicine

## 2022-07-06 ENCOUNTER — Inpatient Hospital Stay: Payer: PPO

## 2022-07-06 ENCOUNTER — Ambulatory Visit: Payer: PPO | Admitting: Medical Oncology

## 2022-07-12 ENCOUNTER — Telehealth: Payer: Self-pay | Admitting: Pharmacist

## 2022-07-12 ENCOUNTER — Telehealth: Payer: Self-pay

## 2022-07-12 NOTE — Telephone Encounter (Signed)
Medicare a+b number is [phone].

## 2022-07-12 NOTE — Telephone Encounter (Signed)
Working with patient to apply for medication assistance for alternative to Jardiance (she was denied Jardiance medication assistance program)  - she should qualify for [place] medication assistance program and can apply on line but will need her Medicare ID number - patient states she does not have a new Medicare card. Provided number where she can request a new card and get her Medicare number. 1-800-MEDICARE (1-4253871826).

## 2022-07-12 NOTE — Patient Outreach (Signed)
  Care Coordination   07/12/2022 Name: Rachael Buck MRN: [id number] DOB: December 05, 1940   Care Coordination Outreach Attempts:  An unsuccessful telephone outreach was attempted today to offer the patient information about available care coordination services as a benefit of their health plan.   Follow Up Plan:  Additional outreach attempts will be made to offer the patient care coordination information and services.   Encounter Outcome:  No Answer  Care Coordination Interventions Activated:  No   Care Coordination Interventions:  No, not indicated    Thea Silversmith, RN, MSN, BSN, [place] Coordinator [phone]

## 2022-07-13 ENCOUNTER — Inpatient Hospital Stay: Payer: PPO | Attending: Family

## 2022-07-13 ENCOUNTER — Inpatient Hospital Stay: Payer: PPO

## 2022-07-13 ENCOUNTER — Inpatient Hospital Stay (HOSPITAL_BASED_OUTPATIENT_CLINIC_OR_DEPARTMENT_OTHER): Payer: PPO | Admitting: Medical Oncology

## 2022-07-13 ENCOUNTER — Encounter: Payer: Self-pay | Admitting: Medical Oncology

## 2022-07-13 ENCOUNTER — Other Ambulatory Visit: Payer: Self-pay

## 2022-07-13 VITALS — BP 136/72 | HR 87 | Temp 98.1°F | Resp 20 | Ht 66.0 in | Wt 189.0 lb

## 2022-07-13 DIAGNOSIS — R109 Unspecified abdominal pain: Secondary | ICD-10-CM | POA: Insufficient documentation

## 2022-07-13 DIAGNOSIS — R635 Abnormal weight gain: Secondary | ICD-10-CM

## 2022-07-13 DIAGNOSIS — K7581 Nonalcoholic steatohepatitis (NASH): Secondary | ICD-10-CM | POA: Diagnosis not present

## 2022-07-13 DIAGNOSIS — R0602 Shortness of breath: Secondary | ICD-10-CM | POA: Insufficient documentation

## 2022-07-13 DIAGNOSIS — E876 Hypokalemia: Secondary | ICD-10-CM | POA: Diagnosis not present

## 2022-07-13 DIAGNOSIS — D6959 Other secondary thrombocytopenia: Secondary | ICD-10-CM | POA: Insufficient documentation

## 2022-07-13 DIAGNOSIS — D500 Iron deficiency anemia secondary to blood loss (chronic): Secondary | ICD-10-CM

## 2022-07-13 DIAGNOSIS — R1084 Generalized abdominal pain: Secondary | ICD-10-CM

## 2022-07-13 LAB — CBC WITH DIFFERENTIAL/PLATELET
Abs Immature Granulocytes: 0.08 10*3/uL — ABNORMAL HIGH (ref 0.00–0.07)
Basophils Absolute: 0 10*3/uL (ref 0.0–0.1)
Basophils Relative: 1 %
Eosinophils Absolute: 0.1 10*3/uL (ref 0.0–0.5)
Eosinophils Relative: 1 %
HCT: 40.3 % (ref 36.0–46.0)
Hemoglobin: 13.8 g/dL (ref 12.0–15.0)
Immature Granulocytes: 1 %
Lymphocytes Relative: 21 %
Lymphs Abs: 1.7 10*3/uL (ref 0.7–4.0)
MCH: 28.5 pg (ref 26.0–34.0)
MCHC: 34.2 g/dL (ref 30.0–36.0)
MCV: 83.1 fL (ref 80.0–100.0)
Monocytes Absolute: 0.7 10*3/uL (ref 0.1–1.0)
Monocytes Relative: 9 %
Neutro Abs: 5.2 10*3/uL (ref 1.7–7.7)
Neutrophils Relative %: 67 %
Platelets: 69 10*3/uL — ABNORMAL LOW (ref 150–400)
RBC: 4.85 MIL/uL (ref 3.87–5.11)
RDW: 16.1 % — ABNORMAL HIGH (ref 11.5–15.5)
WBC: 7.8 10*3/uL (ref 4.0–10.5)
nRBC: 0 % (ref 0.0–0.2)

## 2022-07-13 LAB — FERRITIN: Ferritin: 26 ng/mL (ref 11–307)

## 2022-07-13 LAB — IRON AND IRON BINDING CAPACITY (CC-WL,HP ONLY)
Iron: 135 ug/dL (ref 28–170)
Saturation Ratios: 27 % (ref 10.4–31.8)
TIBC: 505 ug/dL — ABNORMAL HIGH (ref 250–450)
UIBC: 370 ug/dL (ref 148–442)

## 2022-07-13 LAB — RETICULOCYTES
Immature Retic Fract: 10.9 % (ref 2.3–15.9)
RBC.: 4.86 MIL/uL (ref 3.87–5.11)
Retic Count, Absolute: 92.8 10*3/uL (ref 19.0–186.0)
Retic Ct Pct: 1.9 % (ref 0.4–3.1)

## 2022-07-13 LAB — POTASSIUM: Potassium: 3.2 mmol/L — ABNORMAL LOW (ref 3.5–5.1)

## 2022-07-13 MED ORDER — POTASSIUM CHLORIDE CRYS ER 10 MEQ PO TBCR: 10.0000 meq | EXTENDED_RELEASE_TABLET | Freq: Every day | ORAL | 3 refills | Status: DC

## 2022-07-13 NOTE — Progress Notes (Signed)
Hematology and Oncology Follow Up Visit  Rachael Buck [id number] 10-31-1940 81 y.o. 07/13/2022   Principle Diagnosis:  Thrombocytopenia -- NASH/Splenomegaly ; Immune based thrombocytopenia Iron deficiency    Current Therapy:        Nplate as indicated  IV iron as indicated  -- Feraheme given on -12/15/2021    Interim History:  Ms. Enlow is here today for follow-up.    NASH: Weight is stable. Pain of abdomen is better. No peripheral edema.  Wt Readings from Last 3 Encounters:  07/13/22 189 lb 0.6 oz (85.7 kg)  06/24/22 189 lb 3.2 oz (85.8 kg)  06/09/22 192 lb 12.8 oz (87.5 kg)   Thrombocytopenia: No new bleeding or bruising episodes. No hemoptysis, melena, hematuria.   IDA: Last dose of Feraheme was on 12/15/2021. Last ferritin was low at 16. Previously tolerated Venofer well.   Medications:  Allergies as of 07/13/2022       Reactions   Codeine Nausea And Vomiting   Metformin And Related Diarrhea   Shrimp [shellfish Allergy] Swelling        Medication List        Accurate as of July 13, 2022  1:13 PM. If you have any questions, ask your nurse or doctor.          acetaminophen 500 MG tablet Commonly known as: TYLENOL Take 500 mg by mouth every 6 (six) hours as needed for mild pain.   albuterol 108 (90 Base) MCG/ACT inhaler Commonly known as: ProAir HFA Inhale 2 puffs into the lungs every 4 (four) hours as needed for wheezing or shortness of breath.   ALPRAZolam 0.5 MG tablet Commonly known as: XANAX TAKE 1 TABLET(0.5 MG) BY MOUTH THREE TIMES DAILY   blood glucose meter kit and supplies Kit Dispense based on patient and insurance preference. Use up to four times daily as directed.   blood glucose meter kit and supplies Kit Dispense based on patient and insurance preference. Use to check glucose once a day   colestipol 1 g tablet Commonly known as: COLESTID   diphenhydrAMINE 25 MG tablet Commonly known as: BENADRYL Take 25 mg by mouth  every 6 (six) hours as needed (for allergies.).   glipiZIDE 5 MG 24 hr tablet Commonly known as: GLUCOTROL XL TAKE 1 TABLET(5 MG) BY MOUTH DAILY WITH BREAKFAST   hydrochlorothiazide 25 MG tablet Commonly known as: HYDRODIURIL TAKE 1 TABLET(25 MG) BY MOUTH DAILY   hydrocortisone 2.5 % cream Apply 1 application topically daily as needed (itchy dry skin).   ipratropium-albuterol 0.5-2.5 (3) MG/3ML Soln Commonly known as: DUONEB   Jardiance 10 MG Tabs tablet Generic drug: empagliflozin TAKE 1 TABLET(10 MG) BY MOUTH DAILY   lisinopril 20 MG tablet Commonly known as: ZESTRIL Take 1 tablet (20 mg total) by mouth daily.   loperamide 2 MG capsule Commonly known as: IMODIUM Take 4-6 mg by mouth daily as needed (for IBS symptoms).   losartan 50 MG tablet Commonly known as: COZAAR   Melatonin 10 MG Tabs Take 10 mg by mouth at bedtime.   omeprazole 40 MG capsule Commonly known as: PRILOSEC TK 1 C PO QD   OneTouch Delica Lancets Fine Misc 1 each by Other route daily.   OneTouch Ultra test strip Generic drug: glucose blood TEST BLOOD SUGAR(S) ONCE DAILY IN THE MORNING   sertraline 100 MG tablet Commonly known as: ZOLOFT TAKE 1 TABLET(100 MG) BY MOUTH DAILY   simvastatin 80 MG tablet Commonly known as: ZOCOR TAKE 1 TABLET(80 MG)  BY MOUTH DAILY   Stiolto Respimat 2.5-2.5 MCG/ACT Aers Generic drug: Tiotropium Bromide-Olodaterol Inhale 2 puffs into the lungs daily.   sucralfate 1 g tablet Commonly known as: CARAFATE Take 1 g by mouth 4 (four) times daily as needed.   traMADol 50 MG tablet Commonly known as: ULTRAM TAKE 1 TABLET(50 MG) BY MOUTH EVERY 6 HOURS AS NEEDED        Allergies:  Allergies  Allergen Reactions   Codeine Nausea And Vomiting   Metformin And Related Diarrhea   Shrimp [Shellfish Allergy] Swelling    Past Medical History, Surgical history, Social history, and Family History were reviewed and updated.  Review of Systems: Review of Systems   Constitutional:  Positive for malaise/fatigue.  HENT: Negative.    Eyes: Negative.   Respiratory:  Positive for shortness of breath.   Cardiovascular: Negative.   Gastrointestinal:  Positive for abdominal pain.  Genitourinary: Negative.   Musculoskeletal: Negative.   Skin: Negative.   Neurological: Negative.   Endo/Heme/Allergies: Negative.   Psychiatric/Behavioral: Negative.       Physical Exam:  height is 5' 6" (1.676 m) and weight is 189 lb 0.6 oz (85.7 kg). Her oral temperature is 98.1 F (36.7 C). Her blood pressure is 136/72 and her pulse is 87. Her respiration is 20 and oxygen saturation is 96%.   Wt Readings from Last 3 Encounters:  07/13/22 189 lb 0.6 oz (85.7 kg)  06/24/22 189 lb 3.2 oz (85.8 kg)  06/09/22 192 lb 12.8 oz (87.5 kg)    Physical Exam Vitals reviewed.  HENT:     Head: Normocephalic and atraumatic.  Eyes:     General: No scleral icterus.    Pupils: Pupils are equal, round, and reactive to light.  Cardiovascular:     Rate and Rhythm: Normal rate and regular rhythm.     Heart sounds: Normal heart sounds.  Pulmonary:     Effort: Pulmonary effort is normal.     Breath sounds: Normal breath sounds.  Abdominal:     General: Bowel sounds are normal. There is distension (mild to moderate).     Palpations: Abdomen is soft. There is no mass.     Tenderness: There is no abdominal tenderness. There is no guarding or rebound.     Hernia: No hernia is present.  Musculoskeletal:        General: No tenderness or deformity. Normal range of motion.     Cervical back: Normal range of motion.     Right lower leg: No edema.     Left lower leg: No edema.  Lymphadenopathy:     Cervical: No cervical adenopathy.  Skin:    General: Skin is warm and dry.     Coloration: Skin is not jaundiced or pale.     Findings: No erythema or rash.  Neurological:     Mental Status: She is alert and oriented to person, place, and time.  Psychiatric:        Behavior: Behavior  normal.        Thought Content: Thought content normal.        Judgment: Judgment normal.      Lab Results  Component Value Date   WBC 7.8 07/13/2022   HGB 13.8 07/13/2022   HCT 40.3 07/13/2022   MCV 83.1 07/13/2022   PLT 69 (L) 07/13/2022   Lab Results  Component Value Date   FERRITIN 16 06/03/2022   IRON 90 06/03/2022   TIBC 487 (H) 06/03/2022   UIBC  397 06/03/2022   IRONPCTSAT 19 06/03/2022   Lab Results  Component Value Date   RETICCTPCT 1.9 07/13/2022   RBC 4.85 07/13/2022   RBC 4.86 07/13/2022   No results found for: "KPAFRELGTCHN", "LAMBDASER", "KAPLAMBRATIO" No results found for: "IGGSERUM", "IGA", "IGMSERUM" No results found for: "TOTALPROTELP", "ALBUMINELP", "A1GS", "A2GS", "BETS", "BETA2SER", "GAMS", "MSPIKE", "SPEI"   Chemistry      Component Value Date/Time   NA 139 06/03/2022 1011   NA 127 (L) 02/14/2017 1335   K 3.2 (L) 07/13/2022 1150   K 3.6 02/14/2017 1335   CL 96 (L) 06/03/2022 1011   CL 92 (L) 02/14/2017 1335   CO2 34 (H) 06/03/2022 1011   CO2 28 02/14/2017 1335   BUN 16 06/03/2022 1011   BUN 7 (L) 02/14/2017 1335   CREATININE 0.79 06/03/2022 1011   CREATININE 0.47 (L) 02/14/2017 1335   CREATININE 0.56 07/24/2014 1204      Component Value Date/Time   CALCIUM 9.9 06/03/2022 1011   CALCIUM 9.3 02/14/2017 1335   ALKPHOS 55 06/03/2022 1011   ALKPHOS 68 02/14/2017 1335   AST 22 06/03/2022 1011   ALT 13 06/03/2022 1011   BILITOT 1.2 06/03/2022 1011     Encounter Diagnoses  Name Primary?   Hypokalemia    NASH (nonalcoholic steatohepatitis)    Iron deficiency anemia due to chronic blood loss Yes   SOB (shortness of breath)    Generalized abdominal pain    Weight gain     Impression and Plan: Ms. 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  NASH: Acute on chronic. GI follow up recommended.   IDA: Chronic. Stable with hemoglobin of 13.8 today. Iron studies show need for  IV iron. Order placed for IV Venofer today. We will continue to monitor.   SOB: Acute on chronic. Mild. Thought to be pulmonary in origin by PCP- being seen by Pulmonology and has upcoming PFTs scheduled per patient. Restarting IV venofer to see if this helps as she thinks it has in the past.   ABD Pain: Chronic- followed by PCP. Recent CT scan did not show cause for pain or any significant concern. GI may be needed.   Weight Gain: Steady. Will continue to monitor   Hypokalemia: Still a bit low. Will send in rx supplementation since oral increase did not yield the results we hoped for.   Disposition: Nplate and Venofer on Thursday of this week if possible Weekly CBC x 3, Venofer weekly x 5, Nplate weekly if platelets < 100 RTC 1 month MD, labs ( CBCw/. CMP, retic, ferritin, iron/tibc) +- Nplate -[place]  Hughie Closs, PA-C 11/14/20231:13 PM

## 2022-07-13 NOTE — Progress Notes (Unsigned)
Plt 69 pt requesting to defer NPLATE to later this week. MD agreed. Message to scheduling.

## 2022-07-13 NOTE — Telephone Encounter (Signed)
Tried to call patient back. LM on VM.

## 2022-07-15 MED ORDER — DAPAGLIFLOZIN PROPANEDIOL 5 MG PO TABS: 5.0000 mg | ORAL_TABLET | Freq: Every day | ORAL | 4 refills | Status: DC

## 2022-07-15 MED ORDER — EMPAGLIFLOZIN 10 MG PO TABS: 10.0000 mg | ORAL_TABLET | Freq: Every day | ORAL | Status: DC

## 2022-07-15 NOTE — Telephone Encounter (Signed)
Assisted patient with applying for [place] program on line. Patient was approved to get Farxiga 40m - take 1 tablet daily. Will replace Jardiance 165m(patient was denied by Jardiance medication assistance program) Sent Rx to [facility]. Patient should received first shipment by 07/29/2022 - she will call me if she does not.

## 2022-07-15 NOTE — Addendum Note (Signed)
Addended by: Cherre Robins B on: 07/15/2022 02:44 PM   Modules accepted: Orders

## 2022-07-19 ENCOUNTER — Other Ambulatory Visit: Payer: Self-pay | Admitting: Family Medicine

## 2022-07-21 ENCOUNTER — Other Ambulatory Visit: Payer: Self-pay | Admitting: Family Medicine

## 2022-07-21 ENCOUNTER — Telehealth: Payer: Self-pay | Admitting: *Deleted

## 2022-07-21 MED ORDER — DAPAGLIFLOZIN PROPANEDIOL 5 MG PO TABS: 5.0000 mg | ORAL_TABLET | Freq: Every day | ORAL | 4 refills | Status: DC

## 2022-07-21 NOTE — Progress Notes (Signed)
  Care Coordination Note  07/21/2022 Name: JANNESSA OGDEN MRN: [id number] DOB: 03-Feb-1941  Rachael Buck is a 81 y.o. year old female who is a primary care patient of Copland, Gay Filler, MD and is actively engaged with the care management team. I reached out to Margot Chimes by phone today to assist with re-scheduling a follow up visit with the RN Case Manager  Follow up plan: Unsuccessful telephone outreach attempt made. A HIPAA compliant phone message was left for the patient providing contact information and requesting a return call.   Julian Hy, [place] Direct Dial: [phone]

## 2022-07-29 ENCOUNTER — Inpatient Hospital Stay: Payer: PPO

## 2022-07-29 ENCOUNTER — Other Ambulatory Visit: Payer: Self-pay | Admitting: Hematology & Oncology

## 2022-07-29 ENCOUNTER — Other Ambulatory Visit: Payer: Self-pay

## 2022-07-29 DIAGNOSIS — D696 Thrombocytopenia, unspecified: Secondary | ICD-10-CM

## 2022-07-29 DIAGNOSIS — M545 Low back pain, unspecified: Secondary | ICD-10-CM

## 2022-07-29 DIAGNOSIS — M25559 Pain in unspecified hip: Secondary | ICD-10-CM

## 2022-07-29 NOTE — Telephone Encounter (Signed)
Patient called requesting a call back from Tammy regarding her Jardiance. She stated she had not received it yet. Please advise.

## 2022-07-29 NOTE — Telephone Encounter (Signed)
Called [place]. Patient was approved to receive Farxiga 07/15/2022 thru 08/30/2023. Her first order was shipped 07/21/2022 Tracking # [id number].  Estimated delivery date is 08/02/2022.  Patient [place] so she will purchase #14 days until she receives delivery.

## 2022-07-30 ENCOUNTER — Inpatient Hospital Stay: Payer: PPO | Attending: Family

## 2022-07-30 ENCOUNTER — Inpatient Hospital Stay: Payer: PPO

## 2022-07-30 VITALS — HR 89 | Temp 98.3°F | Resp 19

## 2022-07-30 DIAGNOSIS — Z8673 Personal history of transient ischemic attack (TIA), and cerebral infarction without residual deficits: Secondary | ICD-10-CM | POA: Diagnosis not present

## 2022-07-30 DIAGNOSIS — Z79899 Other long term (current) drug therapy: Secondary | ICD-10-CM | POA: Diagnosis not present

## 2022-07-30 DIAGNOSIS — D500 Iron deficiency anemia secondary to blood loss (chronic): Secondary | ICD-10-CM | POA: Insufficient documentation

## 2022-07-30 DIAGNOSIS — R161 Splenomegaly, not elsewhere classified: Secondary | ICD-10-CM | POA: Diagnosis not present

## 2022-07-30 DIAGNOSIS — R5383 Other fatigue: Secondary | ICD-10-CM | POA: Insufficient documentation

## 2022-07-30 DIAGNOSIS — D696 Thrombocytopenia, unspecified: Secondary | ICD-10-CM

## 2022-07-30 LAB — CBC WITH DIFFERENTIAL (CANCER CENTER ONLY)
Abs Immature Granulocytes: 0.06 10*3/uL (ref 0.00–0.07)
Basophils Absolute: 0.1 10*3/uL (ref 0.0–0.1)
Basophils Relative: 1 %
Eosinophils Absolute: 0 10*3/uL (ref 0.0–0.5)
Eosinophils Relative: 1 %
HCT: 39.5 % (ref 36.0–46.0)
Hemoglobin: 13.4 g/dL (ref 12.0–15.0)
Immature Granulocytes: 1 %
Lymphocytes Relative: 24 %
Lymphs Abs: 1.7 10*3/uL (ref 0.7–4.0)
MCH: 28.9 pg (ref 26.0–34.0)
MCHC: 33.9 g/dL (ref 30.0–36.0)
MCV: 85.3 fL (ref 80.0–100.0)
Monocytes Absolute: 0.6 10*3/uL (ref 0.1–1.0)
Monocytes Relative: 8 %
Neutro Abs: 4.5 10*3/uL (ref 1.7–7.7)
Neutrophils Relative %: 65 %
Platelet Count: 65 10*3/uL — ABNORMAL LOW (ref 150–400)
RBC: 4.63 MIL/uL (ref 3.87–5.11)
RDW: 16.2 % — ABNORMAL HIGH (ref 11.5–15.5)
WBC Count: 6.9 10*3/uL (ref 4.0–10.5)
nRBC: 0 % (ref 0.0–0.2)

## 2022-07-30 MED ORDER — SODIUM CHLORIDE 0.9 % IV SOLN
200.0000 mg | Freq: Once | INTRAVENOUS | Status: AC
  Administered 2022-07-30: 200 mg via INTRAVENOUS
  Filled 2022-07-30: qty 200

## 2022-07-30 MED ORDER — SODIUM CHLORIDE 0.9 % IV SOLN: Freq: Once | INTRAVENOUS | Status: AC

## 2022-07-30 NOTE — Progress Notes (Signed)
Upon review of patient chart and orders with Dr. Marin Olp pt lab parameters did not meet need for nplate or IV iron. Apologies given to patient for starting PIV and not needing an infusion. Pt understanding and appreciative of nurses and MD for taking such good care of her.

## 2022-07-30 NOTE — Patient Instructions (Signed)
[facility]  Discharge Instructions: Thank you for choosing [facility] to provide your oncology and hematology care.   If you have a lab appointment with the [facility], please go directly to the [facility] and check in at the registration area.  Wear comfortable clothing and clothing appropriate for easy access to any Portacath or PICC line.   We strive to give you quality time with your provider. You may need to reschedule your appointment if you arrive late (15 or more minutes).  Arriving late affects you and other patients whose appointments are after yours.  Also, if you miss three or more appointments without notifying the office, you may be dismissed from the clinic at the provider's discretion.      For prescription refill requests, have your pharmacy contact our office and allow 72 hours for refills to be completed.    Today you received the following chemotherapy and/or immunotherapy agents Venofer and Nplate      To help prevent nausea and vomiting after your treatment, we encourage you to take your nausea medication as directed.  BELOW ARE SYMPTOMS THAT SHOULD BE REPORTED IMMEDIATELY: *FEVER GREATER THAN 100.4 F (38 C) OR HIGHER *CHILLS OR SWEATING *NAUSEA AND VOMITING THAT IS NOT CONTROLLED WITH YOUR NAUSEA MEDICATION *UNUSUAL SHORTNESS OF BREATH *UNUSUAL BRUISING OR BLEEDING *URINARY PROBLEMS (pain or burning when urinating, or frequent urination) *BOWEL PROBLEMS (unusual diarrhea, constipation, pain near the anus) TENDERNESS IN MOUTH AND THROAT WITH OR WITHOUT PRESENCE OF ULCERS (sore throat, sores in mouth, or a toothache) UNUSUAL RASH, SWELLING OR PAIN  UNUSUAL VAGINAL DISCHARGE OR ITCHING   Items with * indicate a potential emergency and should be followed up as soon as possible or go to the Emergency Department if any problems should occur.  Please show the CHEMOTHERAPY ALERT CARD or IMMUNOTHERAPY ALERT CARD at check-in  to the Emergency Department and triage nurse. Should you have questions after your visit or need to cancel or reschedule your appointment, please contact [facility]  [phone] and follow the prompts.  Office hours are 8:00 a.m. to 4:30 p.m. Monday - Friday. Please note that voicemails left after 4:00 p.m. may not be returned until the following business day.  We are closed weekends and major holidays. You have access to a nurse at all times for urgent questions. Please call the main number to the clinic [phone] and follow the prompts.  For any non-urgent questions, you may also contact your provider using MyChart. We now offer e-Visits for anyone 71 and older to request care online for non-urgent symptoms. For details visit [email or web address].   Also download the MyChart app! Go to the app store, search "MyChart", open the app, select [facility], and log in with your MyChart username and password.  Masks are optional in the cancer centers. If you would like for your care team to wear a mask while they are taking care of you, please let them know. You may have one support person who is at least 80 years old accompany you for your appointments.

## 2022-08-04 ENCOUNTER — Other Ambulatory Visit: Payer: Self-pay | Admitting: Family

## 2022-08-04 DIAGNOSIS — D696 Thrombocytopenia, unspecified: Secondary | ICD-10-CM

## 2022-08-04 DIAGNOSIS — D500 Iron deficiency anemia secondary to blood loss (chronic): Secondary | ICD-10-CM

## 2022-08-04 DIAGNOSIS — K7581 Nonalcoholic steatohepatitis (NASH): Secondary | ICD-10-CM

## 2022-08-05 ENCOUNTER — Other Ambulatory Visit: Payer: Self-pay | Admitting: *Deleted

## 2022-08-05 ENCOUNTER — Inpatient Hospital Stay: Payer: PPO

## 2022-08-05 DIAGNOSIS — D696 Thrombocytopenia, unspecified: Secondary | ICD-10-CM

## 2022-08-05 DIAGNOSIS — D500 Iron deficiency anemia secondary to blood loss (chronic): Secondary | ICD-10-CM

## 2022-08-05 DIAGNOSIS — R0602 Shortness of breath: Secondary | ICD-10-CM

## 2022-08-05 DIAGNOSIS — E876 Hypokalemia: Secondary | ICD-10-CM

## 2022-08-05 LAB — CMP (CANCER CENTER ONLY)
ALT: 13 U/L (ref 0–44)
AST: 23 U/L (ref 15–41)
Albumin: 4.9 g/dL (ref 3.5–5.0)
Alkaline Phosphatase: 48 U/L (ref 38–126)
Anion gap: 7 (ref 5–15)
BUN: 22 mg/dL (ref 8–23)
CO2: 37 mmol/L — ABNORMAL HIGH (ref 22–32)
Calcium: 10.2 mg/dL (ref 8.9–10.3)
Chloride: 96 mmol/L — ABNORMAL LOW (ref 98–111)
Creatinine: 0.72 mg/dL (ref 0.44–1.00)
GFR, Estimated: >60 mL/min (ref >60)
Glucose, Bld: 167 mg/dL — ABNORMAL HIGH (ref 70–99)
Potassium: 3.8 mmol/L (ref 3.5–5.1)
Sodium: 140 mmol/L (ref 135–145)
Total Bilirubin: 1.4 mg/dL — ABNORMAL HIGH (ref 0.3–1.2)
Total Protein: 8 g/dL (ref 6.5–8.1)

## 2022-08-05 LAB — CBC WITH DIFFERENTIAL (CANCER CENTER ONLY)
Abs Immature Granulocytes: 0.03 10*3/uL (ref 0.00–0.07)
Basophils Absolute: 0 10*3/uL (ref 0.0–0.1)
Basophils Relative: 1 %
Eosinophils Absolute: 0.1 10*3/uL (ref 0.0–0.5)
Eosinophils Relative: 1 %
HCT: 40.3 % (ref 36.0–46.0)
Hemoglobin: 13.6 g/dL (ref 12.0–15.0)
Immature Granulocytes: 0 %
Lymphocytes Relative: 22 %
Lymphs Abs: 1.5 10*3/uL (ref 0.7–4.0)
MCH: 28.6 pg (ref 26.0–34.0)
MCHC: 33.7 g/dL (ref 30.0–36.0)
MCV: 84.7 fL (ref 80.0–100.0)
Monocytes Absolute: 0.7 10*3/uL (ref 0.1–1.0)
Monocytes Relative: 10 %
Neutro Abs: 4.5 10*3/uL (ref 1.7–7.7)
Neutrophils Relative %: 66 %
Platelet Count: 68 10*3/uL — ABNORMAL LOW (ref 150–400)
RBC: 4.76 MIL/uL (ref 3.87–5.11)
RDW: 16.3 % — ABNORMAL HIGH (ref 11.5–15.5)
WBC Count: 6.7 10*3/uL (ref 4.0–10.5)
nRBC: 0 % (ref 0.0–0.2)

## 2022-08-05 NOTE — Progress Notes (Signed)
Platelets 68,000  No Nplate per Dr Marin Olp today

## 2022-08-12 ENCOUNTER — Encounter: Payer: Self-pay | Admitting: Oncology

## 2022-08-12 ENCOUNTER — Inpatient Hospital Stay (HOSPITAL_BASED_OUTPATIENT_CLINIC_OR_DEPARTMENT_OTHER): Payer: PPO | Admitting: Oncology

## 2022-08-12 ENCOUNTER — Inpatient Hospital Stay: Payer: PPO

## 2022-08-12 VITALS — BP 153/68 | HR 90 | Temp 98.2°F | Wt 188.0 lb

## 2022-08-12 DIAGNOSIS — D500 Iron deficiency anemia secondary to blood loss (chronic): Secondary | ICD-10-CM

## 2022-08-12 DIAGNOSIS — D696 Thrombocytopenia, unspecified: Secondary | ICD-10-CM

## 2022-08-12 DIAGNOSIS — K7581 Nonalcoholic steatohepatitis (NASH): Secondary | ICD-10-CM

## 2022-08-12 LAB — CBC WITH DIFFERENTIAL (CANCER CENTER ONLY)
Abs Immature Granulocytes: 0.06 10*3/uL (ref 0.00–0.07)
Basophils Absolute: 0 10*3/uL (ref 0.0–0.1)
Basophils Relative: 1 %
Eosinophils Absolute: 0 10*3/uL (ref 0.0–0.5)
Eosinophils Relative: 1 %
HCT: 38.2 % (ref 36.0–46.0)
Hemoglobin: 13.2 g/dL (ref 12.0–15.0)
Immature Granulocytes: 1 %
Lymphocytes Relative: 21 %
Lymphs Abs: 1.1 10*3/uL (ref 0.7–4.0)
MCH: 28.9 pg (ref 26.0–34.0)
MCHC: 34.6 g/dL (ref 30.0–36.0)
MCV: 83.8 fL (ref 80.0–100.0)
Monocytes Absolute: 0.4 10*3/uL (ref 0.1–1.0)
Monocytes Relative: 8 %
Neutro Abs: 3.6 10*3/uL (ref 1.7–7.7)
Neutrophils Relative %: 68 %
Platelet Count: 61 10*3/uL — ABNORMAL LOW (ref 150–400)
RBC: 4.56 MIL/uL (ref 3.87–5.11)
RDW: 16.2 % — ABNORMAL HIGH (ref 11.5–15.5)
WBC Count: 5.2 10*3/uL (ref 4.0–10.5)
nRBC: 0 % (ref 0.0–0.2)

## 2022-08-12 LAB — RETICULOCYTES
Immature Retic Fract: 11.3 % (ref 2.3–15.9)
RBC.: 4.58 MIL/uL (ref 3.87–5.11)
Retic Count, Absolute: 115.4 10*3/uL (ref 19.0–186.0)
Retic Ct Pct: 2.5 % (ref 0.4–3.1)

## 2022-08-12 LAB — IRON AND IRON BINDING CAPACITY (CC-WL,HP ONLY)
Iron: 102 ug/dL (ref 28–170)
Saturation Ratios: 23 % (ref 10.4–31.8)
TIBC: 437 ug/dL (ref 250–450)
UIBC: 335 ug/dL (ref 148–442)

## 2022-08-12 LAB — CMP (CANCER CENTER ONLY)
ALT: 13 U/L (ref 0–44)
AST: 24 U/L (ref 15–41)
Albumin: 4.6 g/dL (ref 3.5–5.0)
Alkaline Phosphatase: 50 U/L (ref 38–126)
Anion gap: 11 (ref 5–15)
BUN: 15 mg/dL (ref 8–23)
CO2: 34 mmol/L — ABNORMAL HIGH (ref 22–32)
Calcium: 9.8 mg/dL (ref 8.9–10.3)
Chloride: 94 mmol/L — ABNORMAL LOW (ref 98–111)
Creatinine: 0.76 mg/dL (ref 0.44–1.00)
GFR, Estimated: >60 mL/min (ref >60)
Glucose, Bld: 258 mg/dL — ABNORMAL HIGH (ref 70–99)
Potassium: 3.1 mmol/L — ABNORMAL LOW (ref 3.5–5.1)
Sodium: 139 mmol/L (ref 135–145)
Total Bilirubin: 1.4 mg/dL — ABNORMAL HIGH (ref 0.3–1.2)
Total Protein: 7.5 g/dL (ref 6.5–8.1)

## 2022-08-12 LAB — FERRITIN: Ferritin: 28 ng/mL (ref 11–307)

## 2022-08-12 MED ORDER — SODIUM CHLORIDE 0.9 % IV SOLN: INTRAVENOUS | Status: DC

## 2022-08-12 MED ORDER — SODIUM CHLORIDE 0.9 % IV SOLN: 200.0000 mg | Freq: Once | INTRAVENOUS | Status: DC

## 2022-08-12 NOTE — Progress Notes (Signed)
Hematology and Oncology Follow Up Visit  Rachael Buck [id number] [birth year] 81 y.o. 08/12/2022   Principle Diagnosis:  Thrombocytopenia -- NASH/Splenomegaly ; Immune based thrombocytopenia Iron deficiency    Current Therapy:        Nplate as indicated  IV iron as indicated  -- Feraheme given on -12/15/2021    Interim History:  Rachael Buck is here today for follow-up.    NASH: Weight is stable. No abdominal pain. No peripheral edema.  Wt Readings from Last 3 Encounters:  08/12/22 85.3 kg  07/13/22 85.7 kg  06/24/22 85.8 kg   Thrombocytopenia: No new bleeding or bruising episodes. No hemoptysis, melena, hematuria.   IDA: Last dose of Feraheme was on 12/15/2021. Last ferritin was normal at 26. Previously tolerated Venofer well.   Husband had a stroke recently.  Currently in short-term rehab.  Sounds like palliative care may also be following.  Medications:  Allergies as of 08/12/2022       Reactions   Codeine Nausea And Vomiting   Metformin And Related Diarrhea   Shrimp [shellfish Allergy] Swelling        Medication List        Accurate as of August 12, 2022 11:53 AM. If you have any questions, ask your nurse or doctor.          acetaminophen 500 MG tablet Commonly known as: TYLENOL Take 500 mg by mouth every 6 (six) hours as needed for mild pain.   albuterol 108 (90 Base) MCG/ACT inhaler Commonly known as: ProAir HFA Inhale 2 puffs into the lungs every 4 (four) hours as needed for wheezing or shortness of breath.   ALPRAZolam 0.5 MG tablet Commonly known as: XANAX TAKE 1 TABLET(0.5 MG) BY MOUTH THREE TIMES DAILY   blood glucose meter kit and supplies Kit Dispense based on patient and insurance preference. Use up to four times daily as directed.   blood glucose meter kit and supplies Kit Dispense based on patient and insurance preference. Use to check glucose once a day   colestipol 1 g tablet Commonly known as: COLESTID   dapagliflozin  propanediol 5 MG Tabs tablet Commonly known as: Farxiga Take 1 tablet (5 mg total) by mouth daily before breakfast.   diphenhydrAMINE 25 MG tablet Commonly known as: BENADRYL Take 25 mg by mouth every 6 (six) hours as needed (for allergies.).   glipiZIDE 5 MG 24 hr tablet Commonly known as: GLUCOTROL XL TAKE 1 TABLET(5 MG) BY MOUTH DAILY WITH BREAKFAST   hydrochlorothiazide 25 MG tablet Commonly known as: HYDRODIURIL TAKE 1 TABLET(25 MG) BY MOUTH DAILY   hydrocortisone 2.5 % cream Apply 1 application topically daily as needed (itchy dry skin).   ipratropium-albuterol 0.5-2.5 (3) MG/3ML Soln Commonly known as: DUONEB   lisinopril 20 MG tablet Commonly known as: ZESTRIL Take 1 tablet (20 mg total) by mouth daily.   loperamide 2 MG capsule Commonly known as: IMODIUM Take 4-6 mg by mouth daily as needed (for IBS symptoms).   losartan 50 MG tablet Commonly known as: COZAAR   Melatonin 10 MG Tabs Take 10 mg by mouth at bedtime.   omeprazole 40 MG capsule Commonly known as: PRILOSEC TK 1 C PO QD   OneTouch Delica Lancets Fine Misc 1 each by Other route daily.   OneTouch Ultra test strip Generic drug: glucose blood TEST BLOOD SUGAR(S) ONCE DAILY IN THE MORNING   potassium chloride 10 MEQ tablet Commonly known as: KLOR-CON M Take 1 tablet (10 mEq total) by mouth  daily.   sertraline 100 MG tablet Commonly known as: ZOLOFT TAKE 1 TABLET(100 MG) BY MOUTH DAILY   simvastatin 80 MG tablet Commonly known as: ZOCOR TAKE 1 TABLET(80 MG) BY MOUTH DAILY   Stiolto Respimat 2.5-2.5 MCG/ACT Aers Generic drug: Tiotropium Bromide-Olodaterol Inhale 2 puffs into the lungs daily.   sucralfate 1 g tablet Commonly known as: CARAFATE Take 1 g by mouth 4 (four) times daily as needed.   traMADol 50 MG tablet Commonly known as: ULTRAM TAKE 1 TABLET(50 MG) BY MOUTH EVERY 6 HOURS AS NEEDED        Allergies:  Allergies  Allergen Reactions   Codeine Nausea And Vomiting    Metformin And Related Diarrhea   Shrimp [Shellfish Allergy] Swelling    Past Medical History, Surgical history, Social history, and Family History were reviewed and updated.  Review of Systems: Review of Systems  Constitutional:  Positive for malaise/fatigue.  HENT: Negative.    Eyes: Negative.   Respiratory:  Positive for shortness of breath.        Has baseline shortness of breath due to asthma.   Cardiovascular: Negative.   Gastrointestinal: Negative.   Genitourinary: Negative.   Musculoskeletal: Negative.   Skin: Negative.   Neurological: Negative.   Endo/Heme/Allergies: Negative.   Psychiatric/Behavioral: Negative.       Physical Exam:  weight is 85.3 kg. Her oral temperature is 98.2 F (36.8 C). Her blood pressure is 153/68 (abnormal) and her pulse is 90. Her oxygen saturation is 96%.   Wt Readings from Last 3 Encounters:  08/12/22 85.3 kg  07/13/22 85.7 kg  06/24/22 85.8 kg    Physical Exam Vitals reviewed.  HENT:     Head: Normocephalic and atraumatic.  Eyes:     General: No scleral icterus.    Conjunctiva/sclera: Conjunctivae normal.  Cardiovascular:     Heart sounds: Normal heart sounds.  Pulmonary:     Effort: Pulmonary effort is normal. No respiratory distress.  Abdominal:     General: Distension: mild to moderate.  Musculoskeletal:        General: No tenderness or deformity.     Right lower leg: No edema.     Left lower leg: No edema.  Skin:    General: Skin is warm and dry.     Coloration: Skin is not jaundiced or pale.     Findings: No erythema or rash.  Neurological:     Mental Status: Rachael Buck is alert and oriented to person, place, and time.  Psychiatric:        Behavior: Behavior normal.        Thought Content: Thought content normal.        Judgment: Judgment normal.     Lab Results  Component Value Date   WBC 5.2 08/12/2022   HGB 13.2 08/12/2022   HCT 38.2 08/12/2022   MCV 83.8 08/12/2022   PLT 61 (L) 08/12/2022   Lab Results   Component Value Date   FERRITIN 26 07/13/2022   IRON 135 07/13/2022   TIBC 505 (H) 07/13/2022   UIBC 370 07/13/2022   IRONPCTSAT 27 07/13/2022   Lab Results  Component Value Date   RETICCTPCT 2.5 08/12/2022   RBC 4.58 08/12/2022   RBC 4.56 08/12/2022   No results found for: "KPAFRELGTCHN", "LAMBDASER", "KAPLAMBRATIO" No results found for: "IGGSERUM", "IGA", "IGMSERUM" No results found for: "TOTALPROTELP", "ALBUMINELP", "A1GS", "A2GS", "BETS", "BETA2SER", "GAMS", "MSPIKE", "SPEI"   Chemistry      Component Value Date/Time   NA 139  08/12/2022 1038   NA 127 (L) 02/14/2017 1335   K 3.1 (L) 08/12/2022 1038   K 3.6 02/14/2017 1335   CL 94 (L) 08/12/2022 1038   CL 92 (L) 02/14/2017 1335   CO2 34 (H) 08/12/2022 1038   CO2 28 02/14/2017 1335   BUN 15 08/12/2022 1038   BUN 7 (L) 02/14/2017 1335   CREATININE 0.76 08/12/2022 1038   CREATININE 0.47 (L) 02/14/2017 1335   CREATININE 0.56 07/24/2014 1204      Component Value Date/Time   CALCIUM 9.8 08/12/2022 1038   CALCIUM 9.3 02/14/2017 1335   ALKPHOS 50 08/12/2022 1038   ALKPHOS 68 02/14/2017 1335   AST 24 08/12/2022 1038   ALT 13 08/12/2022 1038   BILITOT 1.4 (H) 08/12/2022 1038     No diagnosis found.   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  Rachael Buck has also required IV iron in the past for iron deficiency anemia. CBC from today is reviewed.  Hemoglobin is stable at 13.2.  Iron studies are pending.  Will follow-up on these results and consider IV iron if ferritin level is low. Platelet count is stable today at 61,000.  Platelet count has been persistently in the upper 50s to 60,000 range without intervention.  Will consider administration of Nplate only if Rachael Buck requires a surgical procedure. Follow-up in 6 weeks with repeat lab work.  Mikey Bussing, NP 12/14/202311:53 AM

## 2022-08-16 ENCOUNTER — Other Ambulatory Visit: Payer: Self-pay | Admitting: Family Medicine

## 2022-08-19 ENCOUNTER — Ambulatory Visit (INDEPENDENT_AMBULATORY_CARE_PROVIDER_SITE_OTHER): Payer: PPO | Admitting: Emergency Medicine

## 2022-08-19 ENCOUNTER — Encounter: Payer: Self-pay | Admitting: Emergency Medicine

## 2022-08-19 VITALS — BP 132/74 | HR 94 | Temp 98.1°F | Ht 66.0 in | Wt 189.0 lb

## 2022-08-19 DIAGNOSIS — J4489 Other specified chronic obstructive pulmonary disease: Secondary | ICD-10-CM

## 2022-08-19 DIAGNOSIS — R0902 Hypoxemia: Secondary | ICD-10-CM

## 2022-08-19 LAB — PULMONARY FUNCTION TEST
FEF 25-75 Post: 0.81 L/sec
FEF 25-75 Pre: 0.56 L/sec
FEF2575-%Change-Post: 46 %
FEF2575-%Pred-Post: 53 %
FEF2575-%Pred-Pre: 36 %
FEV1-%Change-Post: 11 %
FEV1-%Pred-Post: 57 %
FEV1-%Pred-Pre: 51 %
FEV1-Post: 1.22 L
FEV1-Pre: 1.1 L
FEV1FVC-%Change-Post: 0 %
FEV1FVC-%Pred-Pre: 84 %
FEV6-%Change-Post: 9 %
FEV6-%Pred-Post: 71 %
FEV6-%Pred-Pre: 65 %
FEV6-Post: 1.94 L
FEV6-Pre: 1.77 L
FEV6FVC-%Change-Post: -1 %
FEV6FVC-%Pred-Post: 103 %
FEV6FVC-%Pred-Pre: 105 %
FVC-%Change-Post: 11 %
FVC-%Pred-Post: 69 %
FVC-%Pred-Pre: 61 %
FVC-Post: 1.97 L
FVC-Pre: 1.77 L
Post FEV1/FVC ratio: 62 %
Post FEV6/FVC ratio: 98 %
Pre FEV1/FVC ratio: 62 %
Pre FEV6/FVC Ratio: 100 %
RV % pred: 116 %
RV: 2.91 L
TLC % pred: 97 %
TLC: 5.23 L

## 2022-08-19 MED ORDER — SPIRIVA RESPIMAT 1.25 MCG/ACT IN AERS: 2.0000 | INHALATION_SPRAY | Freq: Every day | RESPIRATORY_TRACT | 0 refills | Status: DC

## 2022-08-19 NOTE — Patient Instructions (Addendum)
We reviewed your pulmonary function testing today. We will plan to restart Stiolto 2 puffs once daily.  We will send a prescription for you after 08/30/2022.  In the meantime please start Spiriva 2 puffs once daily until you are able to obtain the [place]. Keep your albuterol available to use 2 puffs when needed for shortness of breath, chest tightness, wheezing. Please follow with APP in 3 months to assess how you doing on the new medication Follow Dr. Lamonte Sakai in 1 year or sooner if you have any problems.

## 2022-08-19 NOTE — Progress Notes (Signed)
Full PFT with exception of DLCO performed. Patient was not able to follow directions to perform DLCO x 5 attempts.

## 2022-08-19 NOTE — Assessment & Plan Note (Signed)
Her PFTs today confirms significant obstructive disease with a borderline bronchodilator response.  She does not have any bronchitic, asthma symptoms.  No upper airway irritation at this time.  I think we can get her back on Stiolto which did benefit her in the past.  She wants to wait until after the new year to receive this so she will be out of the donut hole.  In the meantime we will give her sample of Spiriva for her to use until the new prescription is available.  We reviewed your pulmonary function testing today. We will plan to restart Stiolto 2 puffs once daily.  We will send a prescription for you after 08/30/2022.  In the meantime please start Spiriva 2 puffs once daily until you are able to obtain the [place]. Keep your albuterol available to use 2 puffs when needed for shortness of breath, chest tightness, wheezing. Please follow with APP in 3 months to assess how you doing on the new medication Follow Dr. Lamonte Sakai in 1 year or sooner if you have any problems.

## 2022-08-19 NOTE — Progress Notes (Signed)
Subjective:    Patient ID: Rachael Buck, female    DOB: 03-24-1941, 81 y.o.   MRN: [id number]  HPI  ROV 06/24/2022 --follow-up visit for 81 year old woman, former smoker, with a history of asthma/obstructive disease, superimposed restriction.  Also with a history of hypertension, Nash cirrhosis, diabetes, splenomegaly.  She has upper airway instability and stridor.  She had COVID-19 in February 2023.  She is followed by Dr. Lorelei Pont who notified me that she has been experiencing some exertional desaturations.  Currently managed on occasional albuterol, has not been using DuoNeb.  She is off Stiolto. She has had some exertional weakness and SOB, has seen some exertional desaturation with walking through the house.   ROV 08/19/2022 -22 year old woman with former tobacco use, combined obstructive lung disease and restriction, upper airway irritation syndrome and occasional stridor.  She also has Nash cirrhosis, diabetes, splenomegaly, had COVID-19 in February 2023.  We repeated her pulmonary function testing today to reassess her degree of obstruction.  She had some witnessed desaturations at home with exertion so we performed a walking oximetry at her last visit.  She did not desaturate and was able to walk 3 laps with some dyspnea. She is not having cough, wheeze. She does have SOB when she exerts herself.   Pulmonary function testing performed today and reviewed by me shows severe obstruction with a borderline bronchodilator response, normal lung volumes (likely pseudonormalization), diffusion capacity not done   Review of Systems As per HPI  Past Medical History:  Diagnosis Date   Anxiety    Arthritis    Asthma    Cirrhosis, nonalcoholic (HCC)    Colon polyps    Diabetes mellitus    Diverticulitis    GERD (gastroesophageal reflux disease)    Hyperlipidemia    Hypertension    NASH (nonalcoholic steatohepatitis)    Obesity    Pneumonia      Family History  Problem Relation Age  of Onset   Stomach cancer Mother    Diabetes Sister    Breast cancer Daughter 70   Breast cancer Maternal Aunt    Diabetes Brother    Cancer Other    GER disease Other    Obesity Other      Social History   Socioeconomic History   Marital status: Married    Spouse name: Not on file   Number of children: Not on file   Years of education: Not on file   Highest education level: Not on file  Occupational History   Not on file  Tobacco Use   Smoking status: Former    Packs/day: 1.50    Years: 15.00    Total pack years: 22.50    Types: Cigarettes   Smokeless tobacco: Never   Tobacco comments:    Quit 2002.  Vaping Use   Vaping Use: Never used  Substance and Sexual Activity   Alcohol use: No   Drug use: No   Sexual activity: Not on file  Other Topics Concern   Not on file  Social History Narrative   Not on file   Social Determinants of Health   Financial Resource Strain: Low Risk  (12/29/2021)   Overall Financial Resource Strain (CARDIA)    Difficulty of Paying Living Expenses: Not hard at all  Food Insecurity: No Food Insecurity (03/29/2022)   Hunger Vital Sign    Worried About Running Out of Food in the Last Year: Never true    Ran Out of Food in the Last  Year: Never true  Transportation Needs: No Transportation Needs (03/29/2022)   PRAPARE - Hydrologist (Medical): No    Lack of Transportation (Non-Medical): No  Physical Activity: Inactive (12/29/2021)   Exercise Vital Sign    Days of Exercise per Week: 0 days    Minutes of Exercise per Session: 0 min  Stress: No Stress Concern Present (12/29/2021)   [place]    Feeling of Stress : Not at all  Social Connections: Moderately Integrated (12/29/2021)   Social Connection and Isolation Panel [NHANES]    Frequency of Communication with Friends and Family: More than three times a week    Frequency of Social Gatherings with  Friends and Family: Once a week    Attends Religious Services: More than 4 times per year    Active Member of [employer] or Organizations: No    Attends Archivist Meetings: Never    Marital Status: Married  Human resources officer Violence: Not At Risk (03/29/2022)   Humiliation, Afraid, Rape, and Kick questionnaire    Fear of Current or Ex-Partner: No    Emotionally Abused: No    Physically Abused: No    Sexually Abused: No     Allergies  Allergen Reactions   Codeine Nausea And Vomiting   Metformin And Related Diarrhea   Shrimp [Shellfish Allergy] Swelling     Outpatient Medications Prior to Visit  Medication Sig Dispense Refill   acetaminophen (TYLENOL) 500 MG tablet Take 500 mg by mouth every 6 (six) hours as needed for mild pain.     albuterol (PROAIR HFA) 108 (90 Base) MCG/ACT inhaler Inhale 2 puffs into the lungs every 4 (four) hours as needed for wheezing or shortness of breath. 1 Inhaler 6   ALPRAZolam (XANAX) 0.5 MG tablet TAKE 1 TABLET(0.5 MG) BY MOUTH THREE TIMES DAILY 90 tablet 2   blood glucose meter kit and supplies KIT Dispense based on patient and insurance preference. Use up to four times daily as directed. 1 each 0   blood glucose meter kit and supplies KIT Dispense based on patient and insurance preference. Use to check glucose once a day 1 each 0   colestipol (COLESTID) 1 g tablet      dapagliflozin propanediol (FARXIGA) 5 MG TABS tablet Take 1 tablet (5 mg total) by mouth daily before breakfast. 90 tablet 4   diphenhydrAMINE (BENADRYL) 25 MG tablet Take 25 mg by mouth every 6 (six) hours as needed (for allergies.).     glipiZIDE (GLUCOTROL XL) 5 MG 24 hr tablet TAKE 1 TABLET(5 MG) BY MOUTH DAILY WITH BREAKFAST 90 tablet 3   glucose blood (ONETOUCH ULTRA) test strip TEST BLOOD SUGAR(S) ONCE DAILY IN THE MORNING 100 strip 12   hydrochlorothiazide (HYDRODIURIL) 25 MG tablet TAKE 1 TABLET(25 MG) BY MOUTH DAILY 90 tablet 1   hydrocortisone 2.5 % cream Apply 1  application topically daily as needed (itchy dry skin).     ipratropium-albuterol (DUONEB) 0.5-2.5 (3) MG/3ML SOLN      lisinopril (ZESTRIL) 20 MG tablet Take 1 tablet (20 mg total) by mouth daily. 90 tablet 3   loperamide (IMODIUM) 2 MG capsule Take 4-6 mg by mouth daily as needed (for IBS symptoms).     losartan (COZAAR) 50 MG tablet      Melatonin 10 MG TABS Take 10 mg by mouth at bedtime.     omeprazole (PRILOSEC) 40 MG capsule TK 1 C PO QD  ONETOUCH DELICA LANCETS FINE MISC 1 each by Other route daily. 100 each 12   potassium chloride (KLOR-CON M) 10 MEQ tablet Take 1 tablet (10 mEq total) by mouth daily. 30 tablet 3   sertraline (ZOLOFT) 100 MG tablet TAKE 1 TABLET(100 MG) BY MOUTH DAILY 90 tablet 1   simvastatin (ZOCOR) 80 MG tablet Take 1 tablet (80 mg total) by mouth daily. 90 tablet 1   sucralfate (CARAFATE) 1 g tablet Take 1 g by mouth 4 (four) times daily as needed.     Tiotropium Bromide-Olodaterol (STIOLTO RESPIMAT) 2.5-2.5 MCG/ACT AERS Inhale 2 puffs into the lungs daily. 4 g 0   traMADol (ULTRAM) 50 MG tablet TAKE 1 TABLET(50 MG) BY MOUTH EVERY 6 HOURS AS NEEDED 60 tablet 0   Facility-Administered Medications Prior to Visit  Medication Dose Route Frequency Provider Last Rate Last Admin   0.9 %  sodium chloride infusion   Intravenous Continuous Curcio, Roselie Awkward, NP             Objective:   Physical Exam Vitals:   08/19/22 1342  BP: 132/74  Pulse: 94  Temp: 98.1 F (36.7 C)  TempSrc: Oral  SpO2: 96%  Weight: 189 lb (85.7 kg)  Height: _0  (1.676 m)   Gen: Pleasant, overweight woman, in no distress,  normal affect  ENT: No lesions,  mouth clear,  oropharynx clear, no postnasal drip  Neck: No JVD, no stridor  Lungs: No use of accessory muscles, no crackles or wheezing on normal respiration, no wheeze on forced expiration  Cardiovascular: RRR, heart sounds normal, no murmur or gallops, no peripheral edema  Musculoskeletal: No deformities, no cyanosis or  clubbing  Neuro: alert, awake, non focal  Skin: Warm, no lesions or rash     Assessment & Plan:  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  She does not have any bronchitic, asthma symptoms.  No upper airway irritation at this time.  I think we can get her back on Stiolto which did benefit her in the past.  She wants to wait until after the new year to receive this so she will be out of the donut hole.  In the meantime we will give her sample of Spiriva for her to use until the new prescription is available.  We reviewed your pulmonary function testing today. We will plan to restart Stiolto 2 puffs once daily.  We will send a prescription for you after 08/30/2022.  In the meantime please start Spiriva 2 puffs once daily until you are able to obtain the [place]. Keep your albuterol available to use 2 puffs when needed for shortness of breath, chest tightness, wheezing. Please follow with APP in 3 months to assess how you doing on the new medication Follow Dr. Lamonte Sakai in 1 year or sooner if you have any problems.  Baltazar Apo, MD, PhD 08/19/2022, 2:19 PM [facility] [phone] or if no answer before 7:00PM call [phone] For any issues after 7:00PM please call eLink [phone]

## 2022-08-19 NOTE — Patient Instructions (Signed)
Full PFT with exception of DLCO perform due to patient inability.

## 2022-09-01 ENCOUNTER — Other Ambulatory Visit: Payer: Self-pay | Admitting: Hematology & Oncology

## 2022-09-01 DIAGNOSIS — M25559 Pain in unspecified hip: Secondary | ICD-10-CM

## 2022-09-01 DIAGNOSIS — M545 Low back pain, unspecified: Secondary | ICD-10-CM

## 2022-09-02 NOTE — Progress Notes (Signed)
  Care Coordination Note  09/02/2022 Name: Rachael Buck MRN: [id number] DOB: 1941-04-20  Rachael Buck is a 82 y.o. year old female who is a primary care patient of Copland, Gay Filler, MD and is actively engaged with the care management team. I reached out to Margot Chimes by phone today to assist with re-scheduling a follow up visit with the RN Case Manager  Follow up plan: Telephone appointment with care management team member scheduled for: 09/08/2022  Julian Hy, [place] Direct Dial: [phone]

## 2022-09-08 ENCOUNTER — Ambulatory Visit: Payer: Self-pay

## 2022-09-08 NOTE — Patient Outreach (Signed)
  Care Coordination   Follow Up Visit Note   09/08/2022 Name: Rachael Buck MRN: [id number] DOB: 1940-10-24  Rachael Buck is a 82 y.o. year old female who sees Copland, Gay Filler, MD for primary care. I spoke with  Rachael Buck by phone today.  What matters to the patients health and wellness today?  Rachael Buck reports she is using her new meter and checking her blood sugars as recommended. Today she reports her blood sugar was 108. She reports she is taking medications as prescribed. Rachael Buck has followed up with Pulmonary provider, Dr. Lamonte Sakai to assess desaturation concerns. She reports Stiolto added. She states she was given a sample of Stiolto and is using as prescribed. She states Dr. Lamonte Sakai to call in a prescription since it is the first of the year. Rachael Buck's husband is currently in SNF under hospice care. She expresses she is trying to spend as much time with him as she can. Declines referral to [facility] LCS[facility] for talk therapy. She reports she has an appointment with provider this month to obtain hearing aids. Rachael Buck to contact RNCM if care coordination needs in the future.   Goals Addressed             This Visit's Progress    COMPLETED: Maintain and or improve Health       Care Coordination Interventions: Reviewed upcoming appointments with patient Medications reviewed. Confirmed patient has medications and is taking as prescribed Encouraged patient to contact RNCM and/or primary care provider if care coordination needs in the future. Provided RNCM contact number again      COMPLETED: Resources: Hearing tested and would like a new CBG machine       Care Coordination Interventions: Confirmed patient has and is using new glucose meter. Patient is happy with new CBG machine and is without any questions. RNCM confirmed patient has an appointment for hearing test to obtain hearing aids this month Encouraged to contact RN case manager and or primary care  provider if care coordination needs in the future.       SDOH assessments and interventions completed:  No  Care Coordination Interventions:  Yes, provided   Follow up plan: No further intervention required.   Encounter Outcome:  Pt. Visit Completed   Thea Silversmith, RN, MSN, BSN, [place] Coordinator [phone]

## 2022-09-08 NOTE — Patient Instructions (Signed)
Visit Information  Thank you for taking time to visit with me today. Please don't hesitate to contact me if I can be of assistance to you.   Following are the goals we discussed today:   Goals Addressed             This Visit's Progress    COMPLETED: Maintain and or improve Health       Care Coordination Interventions: Reviewed upcoming appointments with patient Medications reviewed. Confirmed patient has medications and is taking as prescribed Encouraged patient to contact RNCM and/or primary care provider if care coordination needs in the future. Provided RNCM contact number again      COMPLETED: Resources: Hearing tested and would like a new CBG machine       Care Coordination Interventions: Confirmed patient has and is using new glucose meter. Patient is happy with new CBG machine and is without any questions. RNCM confirmed patient has an appointment for hearing test to obtain hearing aids this month Encouraged to contact RN case manager and or primary care provider if care coordination needs in the future.       If you are experiencing a Mental Health or [place] or need someone to talk to, please call the Suicide and Crisis Lifeline: [place], RN, MSN, BSN, [place] [phone]

## 2022-09-17 ENCOUNTER — Telehealth: Payer: Self-pay | Admitting: Emergency Medicine

## 2022-09-17 MED ORDER — STIOLTO RESPIMAT 2.5-2.5 MCG/ACT IN AERS: 2.0000 | INHALATION_SPRAY | Freq: Every day | RESPIRATORY_TRACT | 5 refills | Status: DC

## 2022-09-17 NOTE — Telephone Encounter (Signed)
Spoke with patients daughter advised Stiolto prescription will be ready at 5:30pm today. Nothing further needed at this time.

## 2022-09-17 NOTE — Telephone Encounter (Signed)
Called and spoke with pt's daughter Belenda Cruise who stated that pt had a flare up of her asthma after the passing of her spouse. Pt was given a sample of Stiolto at last OV which worked well for pt but due to all that has been happening recently with her spouse, pt never called the office to get an Rx sent. Rx has been sent to preferred pharmacy for pt. Stated to Belenda Cruise to let us know if pt got to where she needed something else for her asthma and she verbalized understanding. Did express my condolences to [facility] for the passing of her dad. Nothing further needed.

## 2022-09-23 ENCOUNTER — Inpatient Hospital Stay: Payer: PPO | Admitting: Medical Oncology

## 2022-09-23 ENCOUNTER — Inpatient Hospital Stay: Payer: PPO | Attending: Family

## 2022-09-26 DIAGNOSIS — J45901 Unspecified asthma with (acute) exacerbation: Secondary | ICD-10-CM | POA: Diagnosis not present

## 2022-09-30 ENCOUNTER — Other Ambulatory Visit: Payer: Self-pay | Admitting: Family Medicine

## 2022-09-30 ENCOUNTER — Other Ambulatory Visit: Payer: Self-pay | Admitting: Hematology & Oncology

## 2022-09-30 ENCOUNTER — Encounter: Payer: Self-pay | Admitting: Family Medicine

## 2022-09-30 DIAGNOSIS — M25559 Pain in unspecified hip: Secondary | ICD-10-CM

## 2022-09-30 DIAGNOSIS — M545 Low back pain, unspecified: Secondary | ICD-10-CM

## 2022-10-01 ENCOUNTER — Telehealth: Payer: Self-pay | Admitting: Family Medicine

## 2022-10-01 NOTE — Telephone Encounter (Signed)
Pt called to get an appt with pcp on Monday. Her husband Rachael Buck has passed, so she is working through that and was also at the ER for asthma issues. Did not see any openings on Monday but if there is anyway a chart time can be used please advise.

## 2022-10-01 NOTE — Telephone Encounter (Signed)
Scheduled her to see me on Monday Her husband died on 09/27/22

## 2022-10-02 NOTE — Progress Notes (Unsigned)
[facility] [address], [place], [place] [address] [phone]  Date:  10/04/2022   Name:  Rachael Buck   DOB:  08/28/1941   MRN:  [id number]  PCP:  [name], MD    Chief Complaint: No chief complaint on file.   History of Present Illness:  Rachael Buck is a 82 y.o. 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   Thrombocytopenia ([place]) 02/15/2017   Varicose veins of left lower extremity with complications 40/05/2724   Age-related cognitive decline 06/02/2015   Fatigue 07/19/2014   Orthostatic hypotension 05/14/2013   Nail abnormality 09/04/2012   Bleeding disorder ([place]) 05/08/2012   Varices, 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   NASH (nonalcoholic steatohepatitis) 03/21/2012   HTN (hypertension) 12/21/2011   Depression 03/28/2011   IBS (irritable bowel syndrome) 03/23/2011   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[place]) 08/27/2010   DYSPNEA ON EXERTION 07/27/2010   Uncontrolled diabetes mellitus 07/24/2010   MIXED HYPERLIPIDEMIA 07/24/2010   Obesity, unspecified 07/24/2010   ALLERGIC RHINITIS CAUSE UNSPECIFIED 07/24/2010   REFLUX ESOPHAGITIS 07/24/2010   PAIN IN JOINT  PELVIC REGION AND THIGH 07/24/2010    Past Medical History:  Diagnosis Date   Anxiety    Arthritis    Asthma    Cirrhosis, nonalcoholic ([place])    Colon polyps    Diabetes mellitus    Diverticulitis    GERD (gastroesophageal reflux disease)    Hyperlipidemia    Hypertension    NASH (nonalcoholic steatohepatitis)    Obesity    Pneumonia     Past Surgical History:  Procedure Laterality Date   CHOLECYSTECTOMY  1981   ENDOVENOUS ABLATION SAPHENOUS VEIN W/ LASER Left 02/21/2018   endovenous laser ablation left greater saphenous vein by Tinnie Gens MD    [place]     bilateral. 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   Procedure: LEFT TOTAL KNEE ARTHROPLASTY;  Surgeon: Netta Cedars, MD;  Location: [facility];  Service: Orthopedics;  Laterality: Left;   VEIN SURGERY      Social History   Tobacco Use   Smoking status: Former    Packs/day: 1.50    Years: 15.00    Total pack years: 22.50    Types: Cigarettes   Smokeless tobacco: Never   Tobacco comments:    Quit 2002.  Vaping Use   Vaping Use: Never used  Substance Use Topics   Alcohol use: No   Drug use: No    Family History  Problem Relation Age of Onset   Stomach cancer Mother    Diabetes Sister    Breast cancer  Daughter 69   Breast cancer Maternal Aunt    Diabetes Brother    Cancer Other    GER disease Other    Obesity Other     Allergies  Allergen Reactions   Codeine Nausea And Vomiting   Metformin And Related Diarrhea   Shrimp [Shellfish Allergy] Swelling    Medication list has been reviewed and updated.  Current Outpatient Medications on File Prior to Visit  Medication Sig Dispense Refill   acetaminophen (TYLENOL) 500 MG tablet Take 500 mg by mouth every 6 (six) hours as needed for mild pain.     albuterol (PROAIR HFA) 108 (90 Base) MCG/ACT inhaler Inhale 2 puffs into the lungs every 4  (four) hours as needed for wheezing or shortness of breath. 1 Inhaler 6   ALPRAZolam (XANAX) 0.5 MG tablet TAKE 1 TABLET(0.5 MG) BY MOUTH THREE TIMES DAILY 90 tablet 3   blood glucose meter kit and supplies KIT Dispense based on patient and insurance preference. Use up to four times daily as directed. 1 each 0   blood glucose meter kit and supplies KIT Dispense based on patient and insurance preference. Use to check glucose once a day 1 each 0   colestipol (COLESTID) 1 g tablet      dapagliflozin propanediol (FARXIGA) 5 MG TABS tablet Take 1 tablet (5 mg total) by mouth daily before breakfast. 90 tablet 4   diphenhydrAMINE (BENADRYL) 25 MG tablet Take 25 mg by mouth every 6 (six) hours as needed (for allergies.).     glipiZIDE (GLUCOTROL XL) 5 MG 24 hr tablet TAKE 1 TABLET(5 MG) BY MOUTH DAILY WITH BREAKFAST 90 tablet 3   glucose blood (ONETOUCH ULTRA) test strip TEST BLOOD SUGAR(S) ONCE DAILY IN THE MORNING 100 strip 12   hydrochlorothiazide (HYDRODIURIL) 25 MG tablet TAKE 1 TABLET(25 MG) BY MOUTH DAILY 90 tablet 1   hydrocortisone 2.5 % cream Apply 1 application topically daily as needed (itchy dry skin).     ipratropium-albuterol (DUONEB) 0.5-2.5 (3) MG/3ML SOLN      lisinopril (ZESTRIL) 20 MG tablet Take 1 tablet (20 mg total) by mouth daily. 90 tablet 3   loperamide (IMODIUM) 2 MG capsule Take 4-6 mg by mouth daily as needed (for IBS symptoms).     losartan (COZAAR) 50 MG tablet      Melatonin 10 MG TABS Take 10 mg by mouth at bedtime.     omeprazole (PRILOSEC) 40 MG capsule TK 1 C PO QD     ONETOUCH DELICA LANCETS FINE MISC 1 each by Other route daily. 100 each 12   potassium chloride (KLOR-CON M) 10 MEQ tablet Take 1 tablet (10 mEq total) by mouth daily. 30 tablet 3   sertraline (ZOLOFT) 100 MG tablet TAKE 1 TABLET(100 MG) BY MOUTH DAILY 90 tablet 1   simvastatin (ZOCOR) 80 MG tablet Take 1 tablet (80 mg total) by mouth daily. 90 tablet 1   sucralfate (CARAFATE) 1 g tablet Take 1 g by  mouth 4 (four) times daily as needed.     Tiotropium Bromide-Olodaterol (STIOLTO RESPIMAT) 2.5-2.5 MCG/ACT AERS Inhale 2 puffs into the lungs daily. 4 g 5   traMADol (ULTRAM) 50 MG tablet TAKE 1 TABLET(50 MG) BY MOUTH EVERY 6 HOURS AS NEEDED 60 tablet 0   Current Facility-Administered Medications on File Prior to Visit  Medication Dose Route Frequency Provider Last Rate Last Admin   0.9 %  sodium chloride infusion   Intravenous Continuous Curcio, Roselie Awkward, NP  Review of Systems:  As per HPI- otherwise negative.   Physical Examination: There were no vitals filed for this visit. There were no vitals filed for this visit. There is no height or weight on file to calculate BMI. Ideal Body Weight:    GEN: no acute distress. HEENT: Atraumatic, Normocephalic.  Ears and Nose: No external deformity. CV: RRR, No M/G/R. No JVD. No thrill. No extra heart sounds. PULM: CTA B, no wheezes, crackles, rhonchi. No retractions. No resp. distress. No accessory muscle use. ABD: S, NT, ND, +BS. No rebound. No HSM. EXTR: No c/c/e PSYCH: Normally interactive. Conversant.    Assessment and Plan: ***  Signed Lamar Blinks, MD

## 2022-10-04 ENCOUNTER — Encounter: Payer: Self-pay | Admitting: Family Medicine

## 2022-10-04 ENCOUNTER — Other Ambulatory Visit: Payer: Self-pay | Admitting: Family Medicine

## 2022-10-04 ENCOUNTER — Ambulatory Visit (INDEPENDENT_AMBULATORY_CARE_PROVIDER_SITE_OTHER): Payer: PPO | Admitting: Family Medicine

## 2022-10-04 VITALS — BP 145/85 | HR 91 | Temp 97.8°F | Resp 22 | Ht 66.0 in | Wt 190.4 lb

## 2022-10-04 DIAGNOSIS — E782 Mixed hyperlipidemia: Secondary | ICD-10-CM

## 2022-10-04 DIAGNOSIS — E119 Type 2 diabetes mellitus without complications: Secondary | ICD-10-CM

## 2022-10-04 DIAGNOSIS — F32A Depression, unspecified: Secondary | ICD-10-CM

## 2022-10-04 DIAGNOSIS — J4489 Other specified chronic obstructive pulmonary disease: Secondary | ICD-10-CM

## 2022-10-04 DIAGNOSIS — D696 Thrombocytopenia, unspecified: Secondary | ICD-10-CM | POA: Diagnosis not present

## 2022-10-04 DIAGNOSIS — I10 Essential (primary) hypertension: Secondary | ICD-10-CM | POA: Diagnosis not present

## 2022-10-04 MED ORDER — IPRATROPIUM-ALBUTEROL 0.5-2.5 (3) MG/3ML IN SOLN: RESPIRATORY_TRACT | 1 refills | Status: DC

## 2022-10-04 MED ORDER — LOSARTAN POTASSIUM 50 MG PO TABS: 50.0000 mg | ORAL_TABLET | Freq: Every day | ORAL | 3 refills | Status: DC

## 2022-10-04 MED ORDER — SERTRALINE HCL 100 MG PO TABS: 150.0000 mg | ORAL_TABLET | Freq: Every day | ORAL | 3 refills | Status: DC

## 2022-10-04 NOTE — Patient Instructions (Addendum)
It was good to see you again today, I will be in touch with your labs.  I am so sorry for your recent loss of Rachael Buck!   We can increase your sertraline to 1.5 pills or 150 mg for now- we can go back down when you feel ready I ordered labs which hopefully can be drawn when you see Dr Marin Olp later this week

## 2022-10-05 ENCOUNTER — Other Ambulatory Visit: Payer: Self-pay | Admitting: Hematology & Oncology

## 2022-10-05 DIAGNOSIS — E782 Mixed hyperlipidemia: Secondary | ICD-10-CM

## 2022-10-06 ENCOUNTER — Other Ambulatory Visit: Payer: Self-pay | Admitting: Family Medicine

## 2022-10-06 DIAGNOSIS — F32A Depression, unspecified: Secondary | ICD-10-CM

## 2022-10-06 DIAGNOSIS — H43813 Vitreous degeneration, bilateral: Secondary | ICD-10-CM | POA: Diagnosis not present

## 2022-10-06 DIAGNOSIS — E119 Type 2 diabetes mellitus without complications: Secondary | ICD-10-CM | POA: Diagnosis not present

## 2022-10-06 DIAGNOSIS — H52203 Unspecified astigmatism, bilateral: Secondary | ICD-10-CM | POA: Diagnosis not present

## 2022-10-07 ENCOUNTER — Inpatient Hospital Stay: Payer: PPO | Attending: Family

## 2022-10-07 ENCOUNTER — Inpatient Hospital Stay (HOSPITAL_BASED_OUTPATIENT_CLINIC_OR_DEPARTMENT_OTHER): Payer: PPO | Admitting: Medical Oncology

## 2022-10-07 ENCOUNTER — Encounter: Payer: Self-pay | Admitting: Medical Oncology

## 2022-10-07 ENCOUNTER — Inpatient Hospital Stay: Payer: PPO

## 2022-10-07 ENCOUNTER — Encounter: Payer: Self-pay | Admitting: Family Medicine

## 2022-10-07 VITALS — BP 159/87 | HR 88 | Temp 97.7°F | Resp 20

## 2022-10-07 DIAGNOSIS — E782 Mixed hyperlipidemia: Secondary | ICD-10-CM

## 2022-10-07 DIAGNOSIS — D500 Iron deficiency anemia secondary to blood loss (chronic): Secondary | ICD-10-CM

## 2022-10-07 DIAGNOSIS — K7469 Other cirrhosis of liver: Secondary | ICD-10-CM | POA: Diagnosis not present

## 2022-10-07 DIAGNOSIS — D696 Thrombocytopenia, unspecified: Secondary | ICD-10-CM

## 2022-10-07 DIAGNOSIS — K7581 Nonalcoholic steatohepatitis (NASH): Secondary | ICD-10-CM | POA: Diagnosis not present

## 2022-10-07 DIAGNOSIS — Z8673 Personal history of transient ischemic attack (TIA), and cerebral infarction without residual deficits: Secondary | ICD-10-CM | POA: Diagnosis not present

## 2022-10-07 DIAGNOSIS — Z7984 Long term (current) use of oral hypoglycemic drugs: Secondary | ICD-10-CM | POA: Insufficient documentation

## 2022-10-07 LAB — CBC WITH DIFFERENTIAL (CANCER CENTER ONLY)
Abs Immature Granulocytes: 0.13 10*3/uL — ABNORMAL HIGH (ref 0.00–0.07)
Basophils Absolute: 0 10*3/uL (ref 0.0–0.1)
Basophils Relative: 1 %
Eosinophils Absolute: 0 10*3/uL (ref 0.0–0.5)
Eosinophils Relative: 0 %
HCT: 37.5 % (ref 36.0–46.0)
Hemoglobin: 12.3 g/dL (ref 12.0–15.0)
Immature Granulocytes: 2 %
Lymphocytes Relative: 14 %
Lymphs Abs: 1 10*3/uL (ref 0.7–4.0)
MCH: 28.5 pg (ref 26.0–34.0)
MCHC: 32.8 g/dL (ref 30.0–36.0)
MCV: 87 fL (ref 80.0–100.0)
Monocytes Absolute: 0.5 10*3/uL (ref 0.1–1.0)
Monocytes Relative: 8 %
Neutro Abs: 5.2 10*3/uL (ref 1.7–7.7)
Neutrophils Relative %: 75 %
Platelet Count: 54 10*3/uL — ABNORMAL LOW (ref 150–400)
RBC: 4.31 MIL/uL (ref 3.87–5.11)
RDW: 16.3 % — ABNORMAL HIGH (ref 11.5–15.5)
WBC Count: 6.9 10*3/uL (ref 4.0–10.5)
nRBC: 0 % (ref 0.0–0.2)

## 2022-10-07 LAB — LIPID PANEL
Cholesterol: 144 mg/dL (ref 0–200)
HDL: 54 mg/dL (ref >40)
LDL Cholesterol: 43 mg/dL (ref 0–99)
Total CHOL/HDL Ratio: 2.7 RATIO
Triglycerides: 234 mg/dL — ABNORMAL HIGH (ref <150)
VLDL: 47 mg/dL — ABNORMAL HIGH (ref 0–40)

## 2022-10-07 LAB — CMP (CANCER CENTER ONLY)
ALT: 11 U/L (ref 0–44)
AST: 17 U/L (ref 15–41)
Albumin: 4.3 g/dL (ref 3.5–5.0)
Alkaline Phosphatase: 54 U/L (ref 38–126)
Anion gap: 8 (ref 5–15)
BUN: 16 mg/dL (ref 8–23)
CO2: 32 mmol/L (ref 22–32)
Calcium: 9.8 mg/dL (ref 8.9–10.3)
Chloride: 100 mmol/L (ref 98–111)
Creatinine: 0.7 mg/dL (ref 0.44–1.00)
GFR, Estimated: >60 mL/min (ref >60)
Glucose, Bld: 201 mg/dL — ABNORMAL HIGH (ref 70–99)
Potassium: 4 mmol/L (ref 3.5–5.1)
Sodium: 140 mmol/L (ref 135–145)
Total Bilirubin: 1.1 mg/dL (ref 0.3–1.2)
Total Protein: 7.3 g/dL (ref 6.5–8.1)

## 2022-10-07 LAB — RETICULOCYTES
Immature Retic Fract: 17.8 % — ABNORMAL HIGH (ref 2.3–15.9)
RBC.: 4.29 MIL/uL (ref 3.87–5.11)
Retic Count, Absolute: 100.8 10*3/uL (ref 19.0–186.0)
Retic Ct Pct: 2.4 % (ref 0.4–3.1)

## 2022-10-07 LAB — IRON AND IRON BINDING CAPACITY (CC-WL,HP ONLY)
Iron: 98 ug/dL (ref 28–170)
Saturation Ratios: 25 % (ref 10.4–31.8)
TIBC: 386 ug/dL (ref 250–450)
UIBC: 288 ug/dL (ref 148–442)

## 2022-10-07 LAB — HEMOGLOBIN A1C
Hgb A1c MFr Bld: 6.3 % — ABNORMAL HIGH (ref 4.8–5.6)
Mean Plasma Glucose: 134.11 mg/dL

## 2022-10-07 LAB — FERRITIN: Ferritin: 34 ng/mL (ref 11–307)

## 2022-10-07 MED ORDER — ROMIPLOSTIM 125 MCG ~~LOC~~ SOLR
1.0000 ug/kg | SUBCUTANEOUS | Status: DC
  Administered 2022-10-07: 85 ug via SUBCUTANEOUS
  Filled 2022-10-07: qty 0.17

## 2022-10-07 NOTE — Progress Notes (Signed)
Hematology and Oncology Follow Up Visit  ROSSANNA SPITZLEY [id number] 01-03-1941 82 y.o. 10/07/2022   Principle Diagnosis:  Thrombocytopenia -- NASH/Splenomegaly ; Immune based thrombocytopenia Iron deficiency    Current Therapy:        Nplate as indicated - 65 is target IV iron as indicated  -- Feraheme given on -12/15/2021    Interim History:  Ms. Dohrmann is here today for follow-up.  Since her last visit her husband has passed away from complications of being paralyzed from his third stroke. She is doing ok since this time- has support from family. She does report that she is getting over a bout of asthma which was hard on her. During this event she bent over to pick up something (didn't have her grabber) and fell on her face. Had some bruising but no visual changes, new sudden headaches. Over the past 2 weeks since the fall she has been healing well. Of note she is considering having a cyst removed from her back. She is seeing dermatology for this.   NASH: Weight is stable. No abdominal pain. No peripheral edema.  Wt Readings from Last 3 Encounters:  10/04/22 190 lb 6.4 oz (86.4 kg)  08/19/22 189 lb (85.7 kg)  08/12/22 188 lb 0.6 oz (85.3 kg)   Thrombocytopenia: No new bleeding episodes. Did have some bruising of her face from the fall. No hemoptysis, melena, hematuria.   IDA: Last dose of Feraheme was on 12/15/2021. Last ferritin was normal at 26. Previously tolerated Venofer well.   Medications:  Allergies as of 10/07/2022       Reactions   Codeine Nausea And Vomiting   Metformin And Related Diarrhea   Shrimp [shellfish Allergy] Swelling        Medication List        Accurate as of October 07, 2022 11:03 AM. If you have any questions, ask your nurse or doctor.          acetaminophen 500 MG tablet Commonly known as: TYLENOL Take 500 mg by mouth every 6 (six) hours as needed for mild pain.   albuterol 108 (90 Base) MCG/ACT inhaler Commonly known as: ProAir  HFA Inhale 2 puffs into the lungs every 4 (four) hours as needed for wheezing or shortness of breath.   ALPRAZolam 0.5 MG tablet Commonly known as: XANAX TAKE 1 TABLET(0.5 MG) BY MOUTH THREE TIMES DAILY   blood glucose meter kit and supplies Kit Dispense based on patient and insurance preference. Use up to four times daily as directed.   blood glucose meter kit and supplies Kit Dispense based on patient and insurance preference. Use to check glucose once a day   colestipol 1 g tablet Commonly known as: COLESTID   dapagliflozin propanediol 5 MG Tabs tablet Commonly known as: Farxiga Take 1 tablet (5 mg total) by mouth daily before breakfast.   diphenhydrAMINE 25 MG tablet Commonly known as: BENADRYL Take 25 mg by mouth every 6 (six) hours as needed (for allergies.).   glipiZIDE 5 MG 24 hr tablet Commonly known as: GLUCOTROL XL TAKE 1 TABLET(5 MG) BY MOUTH DAILY WITH BREAKFAST   hydrochlorothiazide 25 MG tablet Commonly known as: HYDRODIURIL TAKE 1 TABLET(25 MG) BY MOUTH DAILY   hydrocortisone 2.5 % cream Apply 1 application topically daily as needed (itchy dry skin).   ipratropium-albuterol 0.5-2.5 (3) MG/3ML Soln Commonly known as: DUONEB Use one 3 ml neb treatment as needed every 6-8 hours prn   loperamide 2 MG capsule Commonly known as: IMODIUM  Take 4-6 mg by mouth daily as needed (for IBS symptoms).   losartan 50 MG tablet Commonly known as: COZAAR Take 1 tablet (50 mg total) by mouth daily.   Melatonin 10 MG Tabs Take 10 mg by mouth at bedtime.   omeprazole 40 MG capsule Commonly known as: PRILOSEC TK 1 C PO QD   OneTouch Delica Lancets Fine Misc 1 each by Other route daily.   OneTouch Ultra test strip Generic drug: glucose blood TEST BLOOD SUGAR(S) ONCE DAILY IN THE MORNING   potassium chloride 10 MEQ tablet Commonly known as: KLOR-CON M Take 1 tablet (10 mEq total) by mouth daily.   sertraline 100 MG tablet Commonly known as: ZOLOFT Take 1  tablet (100 mg total) by mouth daily.   simvastatin 80 MG tablet Commonly known as: ZOCOR Take 1 tablet (80 mg total) by mouth daily.   Stiolto Respimat 2.5-2.5 MCG/ACT Aers Generic drug: Tiotropium Bromide-Olodaterol Inhale 2 puffs into the lungs daily.   sucralfate 1 g tablet Commonly known as: CARAFATE Take 1 g by mouth 4 (four) times daily as needed.   traMADol 50 MG tablet Commonly known as: ULTRAM TAKE 1 TABLET(50 MG) BY MOUTH EVERY 6 HOURS AS NEEDED        Allergies:  Allergies  Allergen Reactions   Codeine Nausea And Vomiting   Metformin And Related Diarrhea   Shrimp [Shellfish Allergy] Swelling    Past Medical History, Surgical history, Social history, and Family History were reviewed and updated.  Review of Systems: Review of Systems  Constitutional:  Positive for malaise/fatigue.  HENT: Negative.    Eyes: Negative.   Respiratory:  Positive for shortness of breath.        Has baseline shortness of breath due to asthma.   Cardiovascular: Negative.   Gastrointestinal: Negative.   Genitourinary: Negative.   Musculoskeletal: Negative.   Skin: Negative.   Neurological: Negative.   Endo/Heme/Allergies: Negative.   Psychiatric/Behavioral: Negative.       Physical Exam:  oral temperature is 97.7 F (36.5 C). Her blood pressure is 159/87 (abnormal) and her pulse is 88. Her respiration is 20 and oxygen saturation is 97%.   Wt Readings from Last 3 Encounters:  10/04/22 190 lb 6.4 oz (86.4 kg)  08/19/22 189 lb (85.7 kg)  08/12/22 188 lb 0.6 oz (85.3 kg)    Physical Exam Vitals reviewed.  HENT:     Head: Normocephalic and atraumatic.  Eyes:     General: No scleral icterus.    Conjunctiva/sclera: Conjunctivae normal.  Cardiovascular:     Heart sounds: Normal heart sounds.  Pulmonary:     Effort: Pulmonary effort is normal. No respiratory distress.  Abdominal:     General: Distension: mild to moderate.  Musculoskeletal:        General: No  tenderness or deformity.     Right lower leg: No edema.     Left lower leg: No edema.  Skin:    General: Skin is warm and dry.     Coloration: Skin is not jaundiced or pale.     Findings: No erythema or rash.  Neurological:     Mental Status: She is alert and oriented to person, place, and time.  Psychiatric:        Behavior: Behavior normal.        Thought Content: Thought content normal.        Judgment: Judgment normal.      Lab Results  Component Value Date   WBC 6.9  10/07/2022   HGB 12.3 10/07/2022   HCT 37.5 10/07/2022   MCV 87.0 10/07/2022   PLT 54 (L) 10/07/2022   Lab Results  Component Value Date   FERRITIN 28 08/12/2022   IRON 102 08/12/2022   TIBC 437 08/12/2022   UIBC 335 08/12/2022   IRONPCTSAT 23 08/12/2022   Lab Results  Component Value Date   RETICCTPCT 2.4 10/07/2022   RBC 4.31 10/07/2022   RBC 4.29 10/07/2022   No results found for: "KPAFRELGTCHN", "LAMBDASER", "KAPLAMBRATIO" No results found for: "IGGSERUM", "IGA", "IGMSERUM" No results found for: "TOTALPROTELP", "ALBUMINELP", "A1GS", "A2GS", "BETS", "BETA2SER", "GAMS", "MSPIKE", "SPEI"   Chemistry      Component Value Date/Time   NA 139 08/12/2022 1038   NA 127 (L) 02/14/2017 1335   K 3.1 (L) 08/12/2022 1038   K 3.6 02/14/2017 1335   CL 94 (L) 08/12/2022 1038   CL 92 (L) 02/14/2017 1335   CO2 34 (H) 08/12/2022 1038   CO2 28 02/14/2017 1335   BUN 15 08/12/2022 1038   BUN 7 (L) 02/14/2017 1335   CREATININE 0.76 08/12/2022 1038   CREATININE 0.47 (L) 02/14/2017 1335   CREATININE 0.56 07/24/2014 1204      Component Value Date/Time   CALCIUM 9.8 08/12/2022 1038   CALCIUM 9.3 02/14/2017 1335   ALKPHOS 50 08/12/2022 1038   ALKPHOS 68 02/14/2017 1335   AST 24 08/12/2022 1038   ALT 13 08/12/2022 1038   BILITOT 1.4 (H) 08/12/2022 1038     No diagnosis found.   Impression and Plan: Ms. 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  She has also required IV iron in the past for iron deficiency anemia.  CBC from today iis normal but down slightly at 12.3 from 13.2. Iron studies are pending.  Will follow-up on these results and consider IV iron if ferritin level is low. Platelet count is stable today is also a bit lower at 54- since she has upcoming surgery planned target will be 100 before surgury. Due to this we will given Nplate today. She will schedule her surgery and schedule follow up within 1 week of this planned procedure. Should surgery be delayed we will see her for her normal follow up in 6 weeks.  Platelet count has been persistently in the upper 50s to 60,000 range without intervention.  Will consider administration of Nplate only if she requires a surgical procedure.   Disposition: Nplate today RTC 6 weeks MD, labs  Hughie Closs, [place] 2/8/202411:03 AM

## 2022-10-07 NOTE — Addendum Note (Signed)
Addended by: Nelwyn [place] on: 10/07/2022 11:53 AM   Modules accepted: Orders

## 2022-10-07 NOTE — Patient Instructions (Signed)
Romiplostim Injection What is this medication? ROMIPLOSTIM ([place] PLOE stim) treats low levels of platelets in your body caused by immune thrombocytopenia (ITP). It is prescribed when other medications have not worked or cannot be tolerated. It may also be used to help people who have been exposed to high doses of radiation. It works by increasing the amount of platelets in your blood. This lowers the risk of bleeding. This medicine may be used for other purposes; ask your health care provider or pharmacist if you have questions. COMMON BRAND NAME(S): Nplate What should I tell my care team before I take this medication? They need to know if you have any of these conditions: Blood clots Myelodysplastic syndrome An unusual or allergic reaction to romiplostim, mannitol, other medications, foods, dyes, or preservatives Pregnant or trying to get pregnant Breast-feeding How should I use this medication? This medication is injected under the skin. It is given by a care team in a hospital or clinic setting. A special MedGuide will be given to you before each treatment. Be sure to read this information carefully each time. Talk to your care team about the use of this medication in children. While it may be prescribed for children as young as newborns for selected conditions, precautions do apply. Overdosage: If you think you have taken too much of this medicine contact a poison control center or emergency room at once. NOTE: This medicine is only for you. Do not share this medicine with others. What if I miss a dose? Keep appointments for follow-up doses. It is important not to miss your dose. Call your care team if you are unable to keep an appointment. What may interact with this medication? Interactions are not expected. This list may not describe all possible interactions. Give your health care provider a list of all the medicines, herbs, non-prescription drugs, or dietary supplements you use. Also  tell them if you smoke, drink alcohol, or use illegal drugs. Some items may interact with your medicine. What should I watch for while using this medication? Visit your care team for regular checks on your progress. You may need blood work done while you are taking this medication. Your condition will be monitored carefully while you are receiving this medication. It is important not to miss any appointments. What side effects may I notice from receiving this medication? Side effects that you should report to your care team as soon as possible: Allergic reactions--skin rash, itching, hives, swelling of the face, lips, tongue, or throat Blood clot--pain, swelling, or warmth in the leg, shortness of breath, chest pain Side effects that usually do not require medical attention (report to your care team if they continue or are bothersome): Dizziness Joint pain Muscle pain Pain in the hands or feet Stomach pain Trouble sleeping This list may not describe all possible side effects. Call your doctor for medical advice about side effects. You may report side effects to FDA at 1-800-FDA-1088. Where should I keep my medication? This medication is given in a hospital or clinic. It will not be stored at home. NOTE: This sheet is a summary. It may not cover all possible information. If you have questions about this medicine, talk to your doctor, pharmacist, or health care provider.  2023 Elsevier/Gold Standard (2021-11-24 00:00:00)

## 2022-11-05 ENCOUNTER — Other Ambulatory Visit: Payer: Self-pay | Admitting: Hematology & Oncology

## 2022-11-05 DIAGNOSIS — M25559 Pain in unspecified hip: Secondary | ICD-10-CM

## 2022-11-05 DIAGNOSIS — M545 Low back pain, unspecified: Secondary | ICD-10-CM

## 2022-11-10 DIAGNOSIS — H903 Sensorineural hearing loss, bilateral: Secondary | ICD-10-CM | POA: Diagnosis not present

## 2022-11-10 DIAGNOSIS — H6123 Impacted cerumen, bilateral: Secondary | ICD-10-CM | POA: Diagnosis not present

## 2022-11-16 ENCOUNTER — Other Ambulatory Visit: Payer: Self-pay

## 2022-11-16 MED ORDER — POTASSIUM CHLORIDE CRYS ER 10 MEQ PO TBCR: 10.0000 meq | EXTENDED_RELEASE_TABLET | Freq: Every day | ORAL | 3 refills | Status: DC

## 2022-11-17 ENCOUNTER — Other Ambulatory Visit: Payer: Self-pay | Admitting: *Deleted

## 2022-11-17 DIAGNOSIS — D696 Thrombocytopenia, unspecified: Secondary | ICD-10-CM

## 2022-11-18 ENCOUNTER — Encounter: Payer: Self-pay | Admitting: Family Medicine

## 2022-11-18 ENCOUNTER — Encounter: Payer: Self-pay | Admitting: Hematology & Oncology

## 2022-11-18 ENCOUNTER — Inpatient Hospital Stay (HOSPITAL_BASED_OUTPATIENT_CLINIC_OR_DEPARTMENT_OTHER): Payer: PPO | Admitting: Hematology & Oncology

## 2022-11-18 ENCOUNTER — Inpatient Hospital Stay: Payer: PPO | Attending: Family

## 2022-11-18 ENCOUNTER — Inpatient Hospital Stay: Payer: PPO

## 2022-11-18 VITALS — BP 151/110 | HR 86 | Temp 98.1°F | Resp 20 | Ht 66.0 in | Wt 188.0 lb

## 2022-11-18 DIAGNOSIS — D696 Thrombocytopenia, unspecified: Secondary | ICD-10-CM

## 2022-11-18 DIAGNOSIS — D693 Immune thrombocytopenic purpura: Secondary | ICD-10-CM | POA: Insufficient documentation

## 2022-11-18 DIAGNOSIS — R161 Splenomegaly, not elsewhere classified: Secondary | ICD-10-CM | POA: Insufficient documentation

## 2022-11-18 DIAGNOSIS — E611 Iron deficiency: Secondary | ICD-10-CM | POA: Diagnosis not present

## 2022-11-18 DIAGNOSIS — K7581 Nonalcoholic steatohepatitis (NASH): Secondary | ICD-10-CM | POA: Diagnosis not present

## 2022-11-18 LAB — CBC WITH DIFFERENTIAL (CANCER CENTER ONLY)
Abs Immature Granulocytes: 0.07 10*3/uL (ref 0.00–0.07)
Basophils Absolute: 0 10*3/uL (ref 0.0–0.1)
Basophils Relative: 1 %
Eosinophils Absolute: 0 10*3/uL (ref 0.0–0.5)
Eosinophils Relative: 0 %
HCT: 38.6 % (ref 36.0–46.0)
Hemoglobin: 13.1 g/dL (ref 12.0–15.0)
Immature Granulocytes: 1 %
Lymphocytes Relative: 19 %
Lymphs Abs: 1.4 10*3/uL (ref 0.7–4.0)
MCH: 28.9 pg (ref 26.0–34.0)
MCHC: 33.9 g/dL (ref 30.0–36.0)
MCV: 85.2 fL (ref 80.0–100.0)
Monocytes Absolute: 0.6 10*3/uL (ref 0.1–1.0)
Monocytes Relative: 8 %
Neutro Abs: 5.3 10*3/uL (ref 1.7–7.7)
Neutrophils Relative %: 71 %
Platelet Count: 65 10*3/uL — ABNORMAL LOW (ref 150–400)
RBC: 4.53 MIL/uL (ref 3.87–5.11)
RDW: 15.9 % — ABNORMAL HIGH (ref 11.5–15.5)
WBC Count: 7.4 10*3/uL (ref 4.0–10.5)
nRBC: 0 % (ref 0.0–0.2)

## 2022-11-18 LAB — CMP (CANCER CENTER ONLY)
ALT: 11 U/L (ref 0–44)
AST: 21 U/L (ref 15–41)
Albumin: 4.7 g/dL (ref 3.5–5.0)
Alkaline Phosphatase: 57 U/L (ref 38–126)
Anion gap: 8 (ref 5–15)
BUN: 17 mg/dL (ref 8–23)
CO2: 31 mmol/L (ref 22–32)
Calcium: 9.7 mg/dL (ref 8.9–10.3)
Chloride: 101 mmol/L (ref 98–111)
Creatinine: 0.71 mg/dL (ref 0.44–1.00)
GFR, Estimated: >60 mL/min (ref >60)
Glucose, Bld: 107 mg/dL — ABNORMAL HIGH (ref 70–99)
Potassium: 4.3 mmol/L (ref 3.5–5.1)
Sodium: 140 mmol/L (ref 135–145)
Total Bilirubin: 1.3 mg/dL — ABNORMAL HIGH (ref 0.3–1.2)
Total Protein: 7.7 g/dL (ref 6.5–8.1)

## 2022-11-18 MED ORDER — ROMIPLOSTIM 125 MCG ~~LOC~~ SOLR
1.0000 ug/kg | SUBCUTANEOUS | Status: DC
  Administered 2022-11-18: 85 ug via SUBCUTANEOUS
  Filled 2022-11-18: qty 0.17

## 2022-11-18 NOTE — Patient Instructions (Signed)
Romiplostim Injection What is this medication? ROMIPLOSTIM ([place] mi PLOE stim) treats low levels of platelets in your body caused by immune thrombocytopenia (ITP). It is prescribed when other medications have not worked or cannot be tolerated. It may also be used to help people who have been exposed to high doses of radiation. It works by increasing the amount of platelets in your blood. This lowers the risk of bleeding. This medicine may be used for other purposes; ask your health care provider or pharmacist if you have questions. COMMON BRAND NAME(S): Nplate What should I tell my care team before I take this medication? They need to know if you have any of these conditions: Blood clots Myelodysplastic syndrome An unusual or allergic reaction to romiplostim, mannitol, other medications, foods, dyes, or preservatives Pregnant or trying to get pregnant Breast-feeding How should I use this medication? This medication is injected under the skin. It is given by a care team in a hospital or clinic setting. A special MedGuide will be given to you before each treatment. Be sure to read this information carefully each time. Talk to your care team about the use of this medication in children. While it may be prescribed for children as young as newborns for selected conditions, precautions do apply. Overdosage: If you think you have taken too much of this medicine contact a poison control center or emergency room at once. NOTE: This medicine is only for you. Do not share this medicine with others. What if I miss a dose? Keep appointments for follow-up doses. It is important not to miss your dose. Call your care team if you are unable to keep an appointment. What may interact with this medication? Interactions are not expected. This list may not describe all possible interactions. Give your health care provider a list of all the medicines, herbs, non-prescription drugs, or dietary supplements you use. Also  tell them if you smoke, drink alcohol, or use illegal drugs. Some items may interact with your medicine. What should I watch for while using this medication? Visit your care team for regular checks on your progress. You may need blood work done while you are taking this medication. Your condition will be monitored carefully while you are receiving this medication. It is important not to miss any appointments. What side effects may I notice from receiving this medication? Side effects that you should report to your care team as soon as possible: Allergic reactions--skin rash, itching, hives, swelling of the face, lips, tongue, or throat Blood clot--pain, swelling, or warmth in the leg, shortness of breath, chest pain Side effects that usually do not require medical attention (report to your care team if they continue or are bothersome): Dizziness Joint pain Muscle pain Pain in the hands or feet Stomach pain Trouble sleeping This list may not describe all possible side effects. Call your doctor for medical advice about side effects. You may report side effects to FDA at 1-800-FDA-1088. Where should I keep my medication? This medication is given in a hospital or clinic. It will not be stored at home. NOTE: This sheet is a summary. It may not cover all possible information. If you have questions about this medicine, talk to your doctor, pharmacist, or health care provider.  2023 Elsevier/Gold Standard (2021-11-24 00:00:00)  

## 2022-11-18 NOTE — Progress Notes (Signed)
Hematology and Oncology Follow Up Visit  Rachael Buck [id number] 1940-09-15 82 y.o. 11/18/2022   Principle Diagnosis:  Thrombocytopenia -- NASH/Splenomegaly ; Immune based thrombocytopenia Iron deficiency    Current Therapy:        Nplate as indicated  IV iron as indicated  -- Feraheme given on -07/30/2022     Interim History:  Rachael Buck is here today for follow-up.  She is doing pretty well, considering the circumstances.  Her husband passed away a couple months ago.  This has been tough on her.  Thankfully, she will be going with her family to [facility] for [place].  I read that there is a new medicine that the FDA approved for NASH.  I told her that she should talk to her Gastroenterologist about this to see if she would qualify for this.  She has had no problems with bleeding.  There is been no issues with bruising.  He has had no issues with nausea or vomiting.  When we last saw her, her ferritin was 34 with an iron saturation of 25%.  Currently, I would say performance status is probably ECOG 1.    Medications:  Allergies as of 11/18/2022       Reactions   Codeine Nausea And Vomiting   Metformin And Related Diarrhea   Shrimp [shellfish Allergy] Swelling        Medication List        Accurate as of November 18, 2022  1:00 PM. If you have any questions, ask your nurse or doctor.          STOP taking these medications    diphenhydrAMINE 25 MG tablet Commonly known as: BENADRYL Stopped by: [name], MD   omeprazole 40 MG capsule Commonly known as: PRILOSEC Stopped by: [name], MD       TAKE these medications    acetaminophen 500 MG tablet Commonly known as: TYLENOL Take 500 mg by mouth every 6 (six) hours as needed for mild pain.   albuterol 108 (90 Base) MCG/ACT inhaler Commonly known as: ProAir HFA Inhale 2 puffs into the lungs every 4 (four) hours as needed for wheezing or shortness of breath.   ALPRAZolam 0.5 MG  tablet Commonly known as: XANAX TAKE 1 TABLET(0.5 MG) BY MOUTH THREE TIMES DAILY   blood glucose meter kit and supplies Kit Dispense based on patient and insurance preference. Use up to four times daily as directed.   colestipol 1 g tablet Commonly known as: COLESTID 1 g daily.   dapagliflozin propanediol 5 MG Tabs tablet Commonly known as: Farxiga Take 1 tablet (5 mg total) by mouth daily before breakfast.   glipiZIDE 5 MG 24 hr tablet Commonly known as: GLUCOTROL XL TAKE 1 TABLET(5 MG) BY MOUTH DAILY WITH BREAKFAST   hydrochlorothiazide 25 MG tablet Commonly known as: HYDRODIURIL TAKE 1 TABLET(25 MG) BY MOUTH DAILY   hydrocortisone 2.5 % cream Apply 1 application topically daily as needed (itchy dry skin).   ipratropium-albuterol 0.5-2.5 (3) MG/3ML Soln Commonly known as: DUONEB Use one 3 ml neb treatment as needed every 6-8 hours prn   loperamide 2 MG capsule Commonly known as: IMODIUM Take 4-6 mg by mouth daily as needed (for IBS symptoms).   losartan 50 MG tablet Commonly known as: COZAAR Take 1 tablet (50 mg total) by mouth daily.   Melatonin 10 MG Tabs Take 10 mg by mouth at bedtime.   OneTouch Delica Lancets Fine Misc 1 each by Other route  daily.   OneTouch Ultra test strip Generic drug: glucose blood TEST BLOOD SUGAR(S) ONCE DAILY IN THE MORNING   potassium chloride 10 MEQ tablet Commonly known as: KLOR-CON M Take 1 tablet (10 mEq total) by mouth daily.   sertraline 100 MG tablet Commonly known as: ZOLOFT Take 1 tablet (100 mg total) by mouth daily.   simvastatin 80 MG tablet Commonly known as: ZOCOR Take 1 tablet (80 mg total) by mouth daily.   Stiolto Respimat 2.5-2.5 MCG/ACT Aers Generic drug: Tiotropium Bromide-Olodaterol Inhale 2 puffs into the lungs daily.   sucralfate 1 g tablet Commonly known as: CARAFATE Take 1 g by mouth 4 (four) times daily as needed.   traMADol 50 MG tablet Commonly known as: ULTRAM TAKE 1 TABLET(50 MG) BY  MOUTH EVERY 6 HOURS AS NEEDED        Allergies:  Allergies  Allergen Reactions   Codeine Nausea And Vomiting   Metformin And Related Diarrhea   Shrimp [Shellfish Allergy] Swelling    Past Medical History, Surgical history, Social history, and Family History were reviewed and updated.  Review of Systems: Review of Systems  Constitutional:  Positive for malaise/fatigue.  HENT: Negative.    Eyes: Negative.   Respiratory: Negative.    Cardiovascular: Negative.   Gastrointestinal:  Positive for abdominal pain.  Genitourinary: Negative.   Musculoskeletal: Negative.   Skin: Negative.   Neurological: Negative.   Endo/Heme/Allergies: Negative.   Psychiatric/Behavioral: Negative.       Physical Exam:  height is 5\' 6"  (1.676 m) and weight is 188 lb (85.3 kg). Her oral temperature is 98.1 F (36.7 C). Her blood pressure is 151/110 (abnormal) and her pulse is 86. Her respiration is 20 and oxygen saturation is 95%.   Wt Readings from Last 3 Encounters:  11/18/22 188 lb (85.3 kg)  10/04/22 190 lb 6.4 oz (86.4 kg)  08/19/22 189 lb (85.7 kg)    Physical Exam Vitals reviewed.  HENT:     Head: Normocephalic and atraumatic.  Eyes:     Pupils: Pupils are equal, round, and reactive to light.  Cardiovascular:     Rate and Rhythm: Normal rate and regular rhythm.     Heart sounds: Normal heart sounds.  Pulmonary:     Effort: Pulmonary effort is normal.     Breath sounds: Normal breath sounds.  Abdominal:     General: Bowel sounds are normal.     Palpations: Abdomen is soft.  Musculoskeletal:        General: No tenderness or deformity. Normal range of motion.     Cervical back: Normal range of motion.  Lymphadenopathy:     Cervical: No cervical adenopathy.  Skin:    General: Skin is warm and dry.     Findings: No erythema or rash.  Neurological:     Mental Status: She is alert and oriented to person, place, and time.  Psychiatric:        Behavior: Behavior normal.         Thought Content: Thought content normal.        Judgment: Judgment normal.      Lab Results  Component Value Date   WBC 7.4 11/18/2022   HGB 13.1 11/18/2022   HCT 38.6 11/18/2022   MCV 85.2 11/18/2022   PLT 65 (L) 11/18/2022   Lab Results  Component Value Date   FERRITIN 34 10/07/2022   IRON 98 10/07/2022   TIBC 386 10/07/2022   UIBC 288 10/07/2022   IRONPCTSAT 25  10/07/2022   Lab Results  Component Value Date   RETICCTPCT 2.4 10/07/2022   RBC 4.53 11/18/2022   No results found for: "KPAFRELGTCHN", "LAMBDASER", "KAPLAMBRATIO" No results found for: "IGGSERUM", "IGA", "IGMSERUM" No results found for: "TOTALPROTELP", "ALBUMINELP", "A1GS", "A2GS", "BETS", "BETA2SER", "GAMS", "MSPIKE", "SPEI"   Chemistry      Component Value Date/Time   NA 140 10/07/2022 1023   NA 127 (L) 02/14/2017 1335   K 4.0 10/07/2022 1023   K 3.6 02/14/2017 1335   CL 100 10/07/2022 1023   CL 92 (L) 02/14/2017 1335   CO2 32 10/07/2022 1023   CO2 28 02/14/2017 1335   BUN 16 10/07/2022 1023   BUN 7 (L) 02/14/2017 1335   CREATININE 0.70 10/07/2022 1023   CREATININE 0.47 (L) 02/14/2017 1335   CREATININE 0.56 07/24/2014 1204      Component Value Date/Time   CALCIUM 9.8 10/07/2022 1023   CALCIUM 9.3 02/14/2017 1335   ALKPHOS 54 10/07/2022 1023   ALKPHOS 68 02/14/2017 1335   AST 17 10/07/2022 1023   ALT 11 10/07/2022 1023   BILITOT 1.1 10/07/2022 1023      Impression and Plan: Rachael Buck is a very pleasant 82 yo caucasian female with 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  We will go ahead and give her Nplate today.  Maybe, she would qualify for this new medicine for NASH.  If so, this might help her platelet count.  We will see what her iron studies look like.  We will go ahead and plan to get her back in another month or so.   [name], MD 3/21/20241:00 PM

## 2022-11-18 NOTE — Progress Notes (Signed)
OK to give pt N-plate today with a platelet count of 65 per order of Dr Marin Olp.

## 2022-11-22 ENCOUNTER — Other Ambulatory Visit: Payer: Self-pay | Admitting: Family Medicine

## 2022-11-25 IMAGING — US US ABDOMEN LIMITED
1 series · 14 of 25 positions shown · non-contrast
Comparison: 08/20/2021

CLINICAL DATA: Evaluate splenic size

EXAM:
ULTRASOUND ABDOMEN LIMITED

[Series 1: us abdomen limited · 14 of 26 slices shown]
[im 1/26]
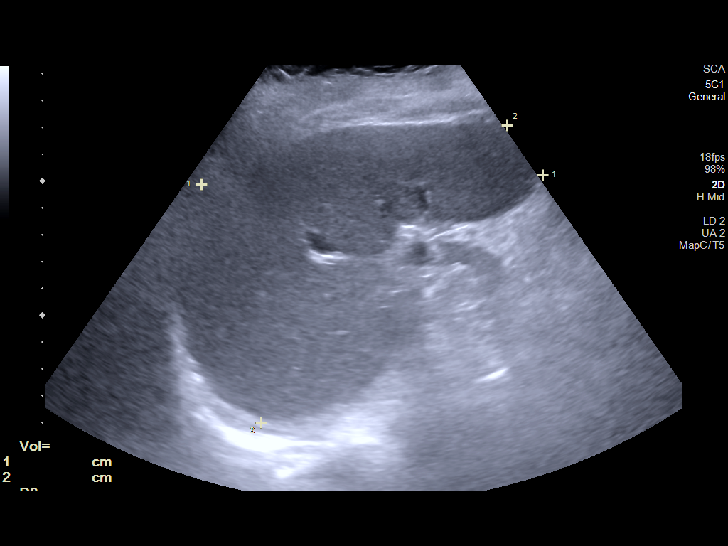
[im 3/26]
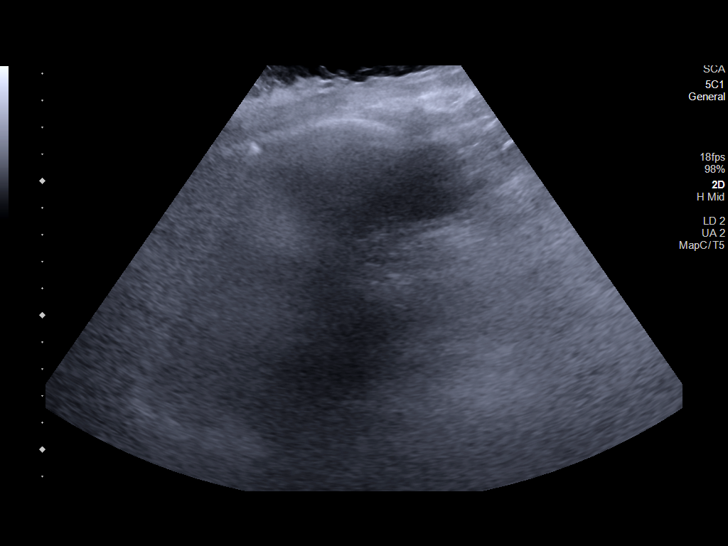
[im 5/26]
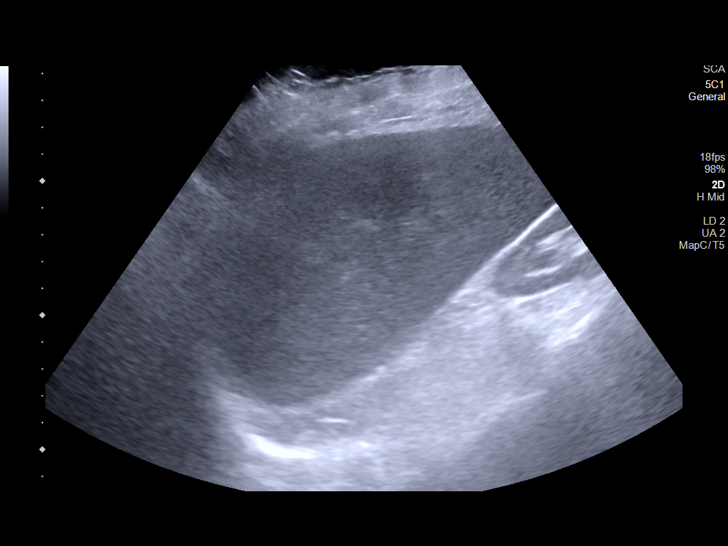
[im 7/26]
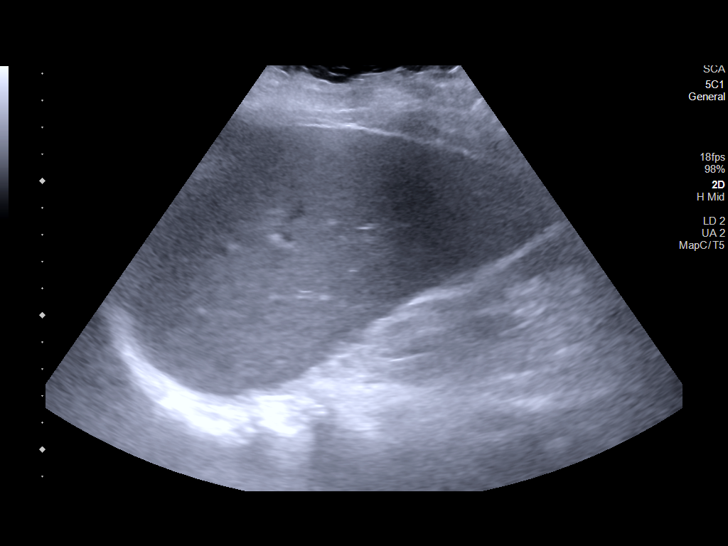
[im 9/26]
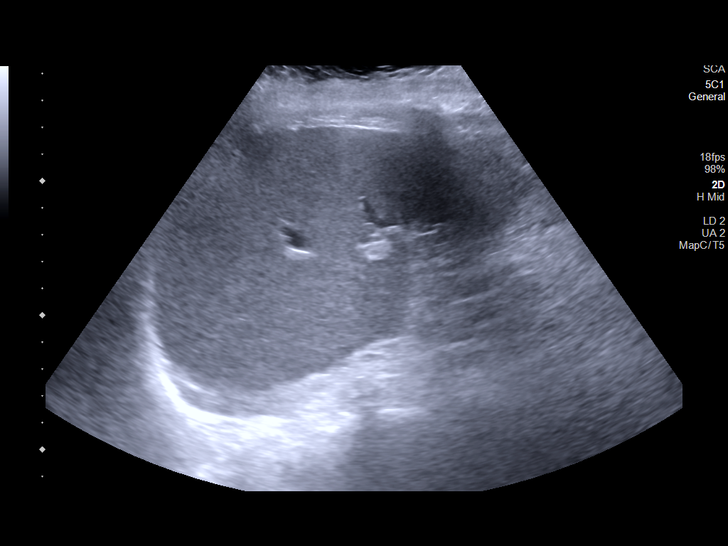
[im 10/26]
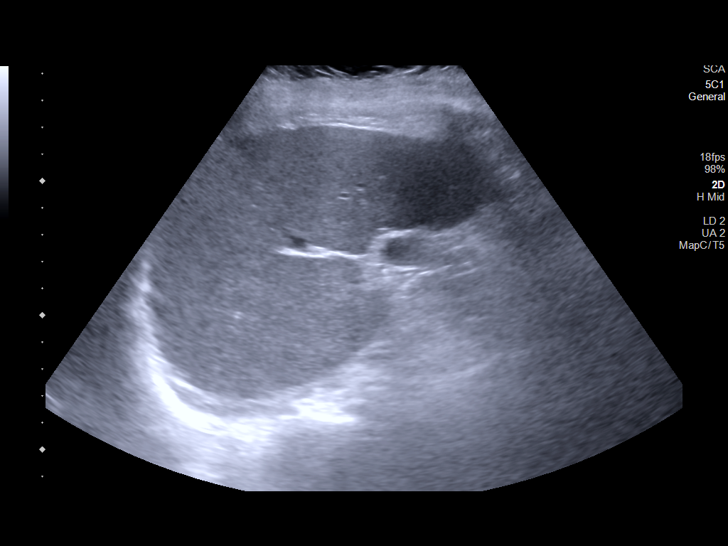
[im 12/26]
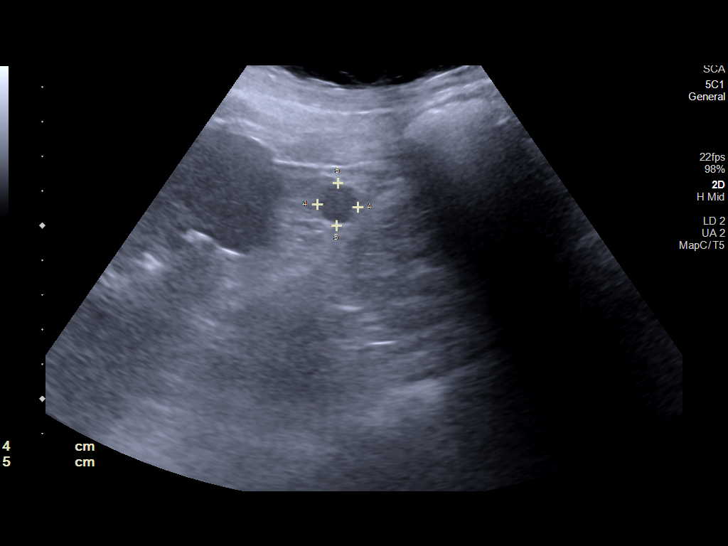
[im 14/26]
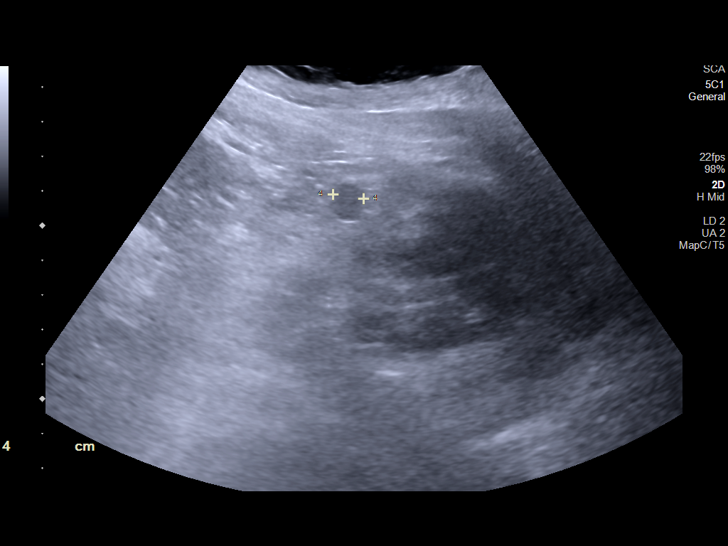
[im 16/26]
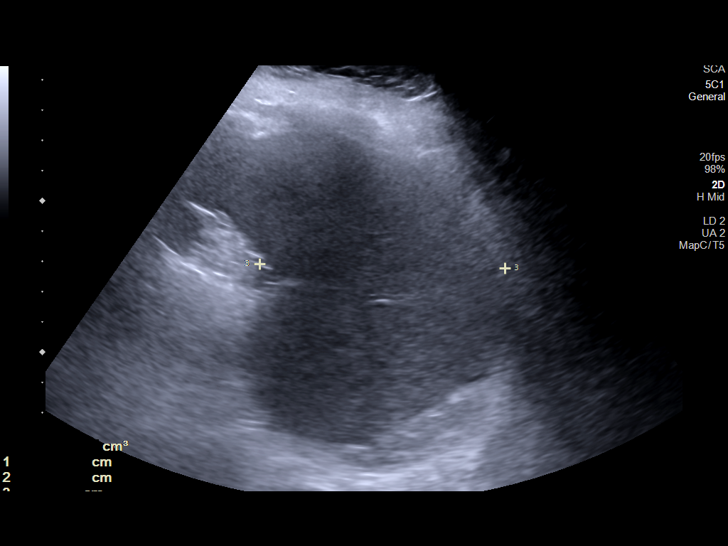
[im 17/26]
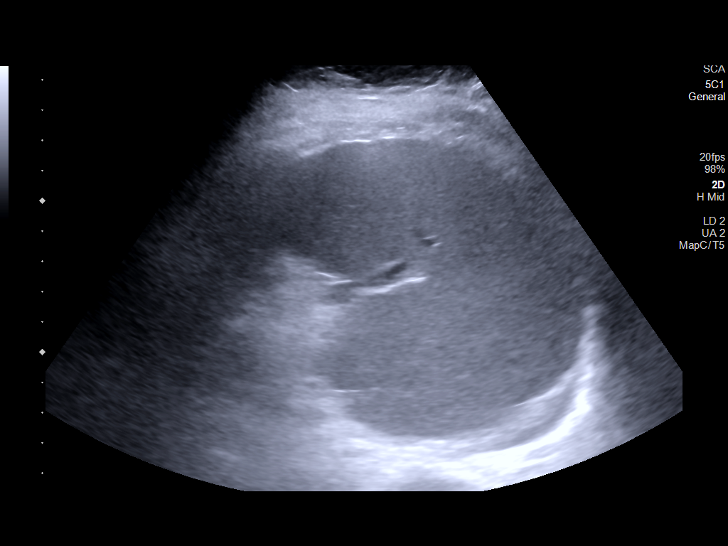
[im 19/26]
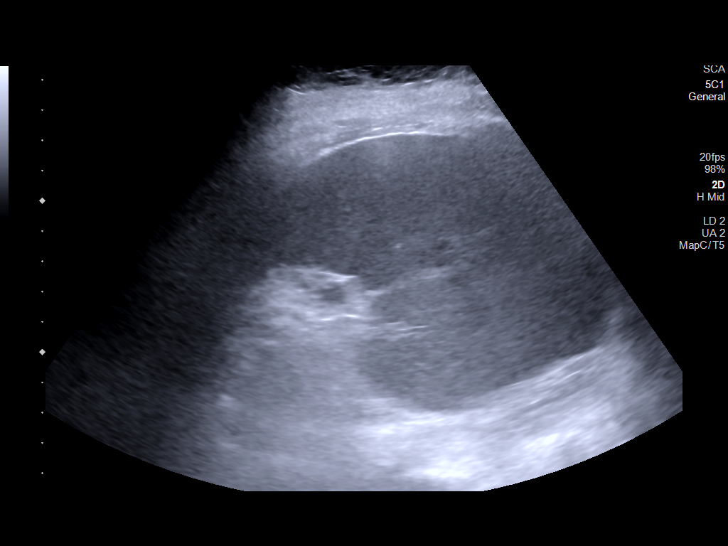
[im 21/26]
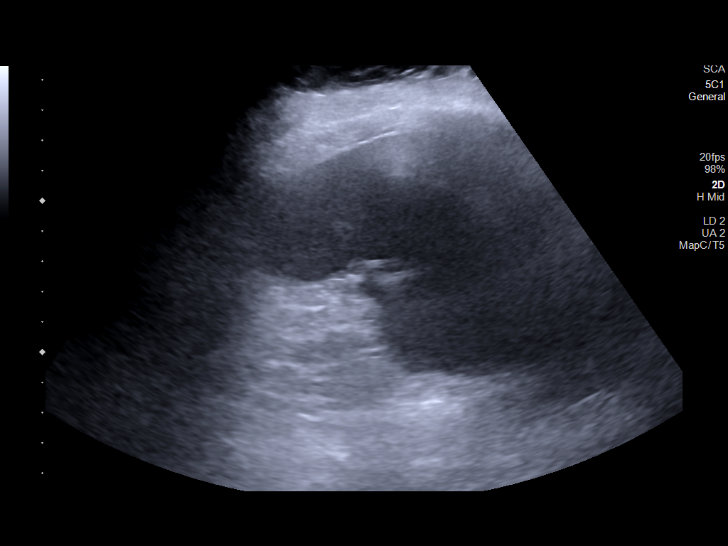
[im 23/26]
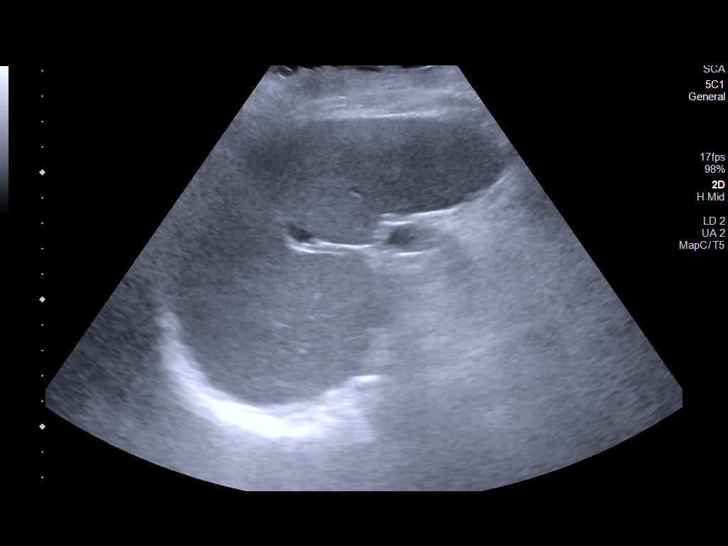
[im 26/26]
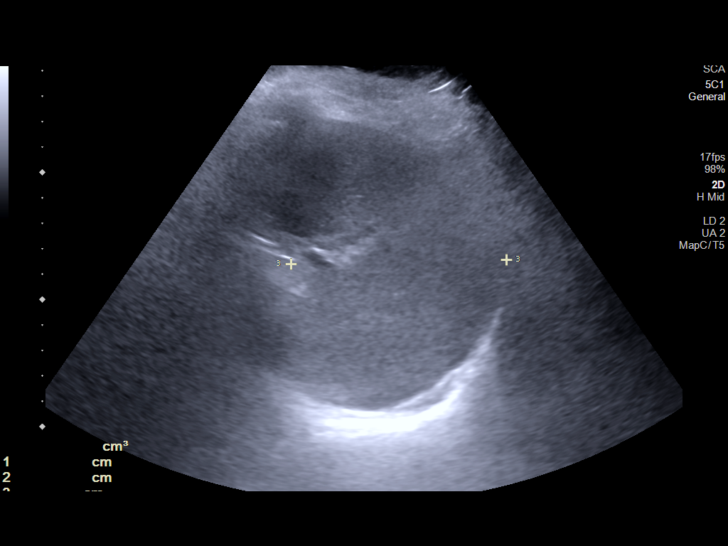

[14 of 25 positions shown; findings below may reference images not displayed]

FINDINGS: No focal splenic lesion. Small splenules. Splenomegaly at 12.7 x
14.4 x 8.1 cm. (Volume = 780 cm^3). Compare 797 cc on the prior
exam.
IMPRESSION: Similar moderate splenomegaly.

## 2022-12-07 ENCOUNTER — Other Ambulatory Visit: Payer: Self-pay | Admitting: Family Medicine

## 2022-12-07 DIAGNOSIS — F32A Depression, unspecified: Secondary | ICD-10-CM

## 2022-12-13 DIAGNOSIS — L720 Epidermal cyst: Secondary | ICD-10-CM | POA: Diagnosis not present

## 2022-12-14 ENCOUNTER — Inpatient Hospital Stay: Payer: PPO

## 2022-12-14 ENCOUNTER — Inpatient Hospital Stay: Payer: PPO | Attending: Family

## 2022-12-14 ENCOUNTER — Telehealth: Payer: Self-pay | Admitting: *Deleted

## 2022-12-14 ENCOUNTER — Encounter: Payer: Self-pay | Admitting: Family Medicine

## 2022-12-14 ENCOUNTER — Encounter (INDEPENDENT_AMBULATORY_CARE_PROVIDER_SITE_OTHER): Payer: PPO | Admitting: Family Medicine

## 2022-12-14 ENCOUNTER — Inpatient Hospital Stay (HOSPITAL_BASED_OUTPATIENT_CLINIC_OR_DEPARTMENT_OTHER): Payer: PPO | Admitting: Medical Oncology

## 2022-12-14 VITALS — BP 186/67 | HR 85 | Temp 98.2°F | Resp 19 | Ht 66.0 in | Wt 190.8 lb

## 2022-12-14 VITALS — BP 162/70

## 2022-12-14 DIAGNOSIS — Z79899 Other long term (current) drug therapy: Secondary | ICD-10-CM | POA: Diagnosis not present

## 2022-12-14 DIAGNOSIS — E611 Iron deficiency: Secondary | ICD-10-CM | POA: Diagnosis not present

## 2022-12-14 DIAGNOSIS — D696 Thrombocytopenia, unspecified: Secondary | ICD-10-CM | POA: Diagnosis not present

## 2022-12-14 DIAGNOSIS — Z7984 Long term (current) use of oral hypoglycemic drugs: Secondary | ICD-10-CM | POA: Insufficient documentation

## 2022-12-14 DIAGNOSIS — D500 Iron deficiency anemia secondary to blood loss (chronic): Secondary | ICD-10-CM | POA: Diagnosis not present

## 2022-12-14 DIAGNOSIS — K7581 Nonalcoholic steatohepatitis (NASH): Secondary | ICD-10-CM | POA: Diagnosis not present

## 2022-12-14 DIAGNOSIS — R519 Headache, unspecified: Secondary | ICD-10-CM | POA: Diagnosis not present

## 2022-12-14 DIAGNOSIS — R030 Elevated blood-pressure reading, without diagnosis of hypertension: Secondary | ICD-10-CM | POA: Insufficient documentation

## 2022-12-14 DIAGNOSIS — I10 Essential (primary) hypertension: Secondary | ICD-10-CM | POA: Diagnosis not present

## 2022-12-14 DIAGNOSIS — D693 Immune thrombocytopenic purpura: Secondary | ICD-10-CM | POA: Diagnosis not present

## 2022-12-14 LAB — CBC WITH DIFFERENTIAL (CANCER CENTER ONLY)
Abs Immature Granulocytes: 0.06 10*3/uL (ref 0.00–0.07)
Basophils Absolute: 0 10*3/uL (ref 0.0–0.1)
Basophils Relative: 1 %
Eosinophils Absolute: 0 10*3/uL (ref 0.0–0.5)
Eosinophils Relative: 1 %
HCT: 37.8 % (ref 36.0–46.0)
Hemoglobin: 12.7 g/dL (ref 12.0–15.0)
Immature Granulocytes: 1 %
Lymphocytes Relative: 17 %
Lymphs Abs: 1.1 10*3/uL (ref 0.7–4.0)
MCH: 28.3 pg (ref 26.0–34.0)
MCHC: 33.6 g/dL (ref 30.0–36.0)
MCV: 84.4 fL (ref 80.0–100.0)
Monocytes Absolute: 0.5 10*3/uL (ref 0.1–1.0)
Monocytes Relative: 9 %
Neutro Abs: 4.5 10*3/uL (ref 1.7–7.7)
Neutrophils Relative %: 71 %
Platelet Count: 54 10*3/uL — ABNORMAL LOW (ref 150–400)
RBC: 4.48 MIL/uL (ref 3.87–5.11)
RDW: 15.5 % (ref 11.5–15.5)
WBC Count: 6.2 10*3/uL (ref 4.0–10.5)
nRBC: 0 % (ref 0.0–0.2)

## 2022-12-14 LAB — CMP (CANCER CENTER ONLY)
ALT: 8 U/L (ref 0–44)
AST: 16 U/L (ref 15–41)
Albumin: 4.3 g/dL (ref 3.5–5.0)
Alkaline Phosphatase: 57 U/L (ref 38–126)
Anion gap: 8 (ref 5–15)
BUN: 14 mg/dL (ref 8–23)
CO2: 30 mmol/L (ref 22–32)
Calcium: 9.1 mg/dL (ref 8.9–10.3)
Chloride: 102 mmol/L (ref 98–111)
Creatinine: 0.7 mg/dL (ref 0.44–1.00)
GFR, Estimated: >60 mL/min (ref >60)
Glucose, Bld: 144 mg/dL — ABNORMAL HIGH (ref 70–99)
Potassium: 4.1 mmol/L (ref 3.5–5.1)
Sodium: 140 mmol/L (ref 135–145)
Total Bilirubin: 1.1 mg/dL (ref 0.3–1.2)
Total Protein: 7.4 g/dL (ref 6.5–8.1)

## 2022-12-14 LAB — RETICULOCYTES
Immature Retic Fract: 14.4 % (ref 2.3–15.9)
RBC.: 4.46 MIL/uL (ref 3.87–5.11)
Retic Count, Absolute: 103.5 10*3/uL (ref 19.0–186.0)
Retic Ct Pct: 2.3 % (ref 0.4–3.1)

## 2022-12-14 LAB — IRON AND IRON BINDING CAPACITY (CC-WL,HP ONLY)
Iron: 105 ug/dL (ref 28–170)
Saturation Ratios: 24 % (ref 10.4–31.8)
TIBC: 437 ug/dL (ref 250–450)
UIBC: 332 ug/dL (ref 148–442)

## 2022-12-14 LAB — FERRITIN: Ferritin: 22 ng/mL (ref 11–307)

## 2022-12-14 MED ORDER — HYDROCHLOROTHIAZIDE 25 MG PO TABS: ORAL_TABLET | ORAL | 3 refills | Status: DC

## 2022-12-14 MED ORDER — ROMIPLOSTIM 125 MCG ~~LOC~~ SOLR
1.0000 ug/kg | SUBCUTANEOUS | Status: DC
  Administered 2022-12-14: 85 ug via SUBCUTANEOUS
  Filled 2022-12-14: qty 0.17

## 2022-12-14 NOTE — Progress Notes (Unsigned)
Hematology and Oncology Follow Up Visit  Rachael Buck [id number] September 30, 1940 82 y.o. 12/14/2022   Principle Diagnosis:  Thrombocytopenia -- NASH/Splenomegaly ; Immune based thrombocytopenia Iron deficiency    Current Therapy:        Nplate as indicated  IV iron as indicated  -- Feraheme given on -07/30/2022     Interim History:  Rachael Buck is here today for follow-up.  She reports that Rachael Buck PCP took Rachael Buck off of Rachael Buck blood pressure pill- the "white one". She is not sure why. Since then she has been having some occasional headaches and thinks Rachael Buck blood pressure has been running high. No chest pain, SOB or peripheral edema.   She has had no problems with bleeding.  There is been no issues with bruising.  He has had no issues with nausea or vomiting.  Ferritin for our visit today is 22 with an iron saturation of 24%.   Currently, I would say performance status is probably ECOG 1.    Medications:  Allergies as of 12/14/2022       Reactions   Codeine Nausea And Vomiting   Metformin And Related Diarrhea   Shrimp [shellfish Allergy] Swelling        Medication List        Accurate as of December 14, 2022 12:11 PM. If you have any questions, ask your nurse or doctor.          acetaminophen 500 MG tablet Commonly known as: TYLENOL Take 500 mg by mouth every 6 (six) hours as needed for mild pain.   albuterol 108 (90 Base) MCG/ACT inhaler Commonly known as: ProAir HFA Inhale 2 puffs into the lungs every 4 (four) hours as needed for wheezing or shortness of breath.   ALPRAZolam 0.5 MG tablet Commonly known as: XANAX TAKE 1 TABLET(0.5 MG) BY MOUTH THREE TIMES DAILY   blood glucose meter kit and supplies Kit Dispense based on patient and insurance preference. Use up to four times daily as directed.   colestipol 1 g tablet Commonly known as: COLESTID 1 g daily.   dapagliflozin propanediol 5 MG Tabs tablet Commonly known as: Farxiga Take 1 tablet (5 mg total) by mouth  daily before breakfast.   glipiZIDE 5 MG 24 hr tablet Commonly known as: GLUCOTROL XL TAKE 1 TABLET(5 MG) BY MOUTH DAILY WITH BREAKFAST   hydrochlorothiazide 25 MG tablet Commonly known as: HYDRODIURIL TAKE 1 TABLET(25 MG) BY MOUTH DAILY   hydrocortisone 2.5 % cream Apply 1 application topically daily as needed (itchy dry skin).   ipratropium-albuterol 0.5-2.5 (3) MG/3ML Soln Commonly known as: DUONEB Use one 3 ml neb treatment as needed every 6-8 hours prn   loperamide 2 MG capsule Commonly known as: IMODIUM Take 4-6 mg by mouth daily as needed (for IBS symptoms).   losartan 50 MG tablet Commonly known as: COZAAR Take 1 tablet (50 mg total) by mouth daily.   Melatonin 10 MG Tabs Take 10 mg by mouth at bedtime.   OneTouch Delica Lancets Fine Misc 1 each by Other route daily.   OneTouch Ultra test strip Generic drug: glucose blood TEST BLOOD SUGAR(S) ONCE DAILY IN THE MORNING   potassium chloride 10 MEQ tablet Commonly known as: KLOR-CON M Take 1 tablet (10 mEq total) by mouth daily.   sertraline 100 MG tablet Commonly known as: ZOLOFT Take 1 tablet (100 mg total) by mouth daily.   simvastatin 80 MG tablet Commonly known as: ZOCOR Take 1 tablet (80 mg total) by mouth daily.  Stiolto Respimat 2.5-2.5 MCG/ACT Aers Generic drug: Tiotropium Bromide-Olodaterol Inhale 2 puffs into the lungs daily.   sucralfate 1 g tablet Commonly known as: CARAFATE Take 1 g by mouth 4 (four) times daily as needed.   traMADol 50 MG tablet Commonly known as: ULTRAM TAKE 1 TABLET(50 MG) BY MOUTH EVERY 6 HOURS AS NEEDED        Allergies:  Allergies  Allergen Reactions   Codeine Nausea And Vomiting   Metformin And Related Diarrhea   Shrimp [Shellfish Allergy] Swelling    Past Medical History, Surgical history, Social history, and Family History were reviewed and updated.  Review of Systems: Review of Systems  Constitutional:  Positive for malaise/fatigue.  HENT:  Negative.    Eyes: Negative.   Respiratory: Negative.    Cardiovascular: Negative.   Gastrointestinal:  Negative for abdominal pain.  Genitourinary: Negative.   Musculoskeletal: Negative.   Skin: Negative.   Neurological: Negative.   Endo/Heme/Allergies: Negative.   Psychiatric/Behavioral: Negative.       Physical Exam:  height is 5\' 6"  (1.676 m) and weight is 190 lb 12.8 oz (86.5 kg). Rachael Buck oral temperature is 98.2 F (36.8 C). Rachael Buck blood pressure is 186/67 (abnormal) and Rachael Buck pulse is 85. Rachael Buck respiration is 19.   Wt Readings from Last 3 Encounters:  12/14/22 190 lb 12.8 oz (86.5 kg)  11/18/22 188 lb (85.3 kg)  10/04/22 190 lb 6.4 oz (86.4 kg)    Physical Exam Vitals reviewed.  HENT:     Head: Normocephalic and atraumatic.  Eyes:     Pupils: Pupils are equal, round, and reactive to light.  Cardiovascular:     Rate and Rhythm: Normal rate and regular rhythm.     Heart sounds: Normal heart sounds.  Pulmonary:     Effort: Pulmonary effort is normal.     Breath sounds: Normal breath sounds.  Abdominal:     General: Bowel sounds are normal.     Palpations: Abdomen is soft.  Musculoskeletal:        General: No tenderness or deformity. Normal range of motion.     Cervical back: Normal range of motion.  Lymphadenopathy:     Cervical: No cervical adenopathy.  Skin:    General: Skin is warm and dry.     Findings: No erythema or rash.  Neurological:     Mental Status: She is alert and oriented to person, place, and time.  Psychiatric:        Behavior: Behavior normal.        Thought Content: Thought content normal.        Judgment: Judgment normal.      Lab Results  Component Value Date   WBC 6.2 12/14/2022   HGB 12.7 12/14/2022   HCT 37.8 12/14/2022   MCV 84.4 12/14/2022   PLT 54 (L) 12/14/2022   Lab Results  Component Value Date   FERRITIN 34 10/07/2022   IRON 98 10/07/2022   TIBC 386 10/07/2022   UIBC 288 10/07/2022   IRONPCTSAT 25 10/07/2022   Lab  Results  Component Value Date   RETICCTPCT 2.3 12/14/2022   RBC 4.48 12/14/2022   RBC 4.46 12/14/2022   No results found for: "KPAFRELGTCHN", "LAMBDASER", "KAPLAMBRATIO" No results found for: "IGGSERUM", "IGA", "IGMSERUM" No results found for: "TOTALPROTELP", "ALBUMINELP", "A1GS", "A2GS", "BETS", "BETA2SER", "GAMS", "MSPIKE", "SPEI"   Chemistry      Component Value Date/Time   NA 140 12/14/2022 1103   NA 127 (L) 02/14/2017 1335   K 4.1  12/14/2022 1103   K 3.6 02/14/2017 1335   CL 102 12/14/2022 1103   CL 92 (L) 02/14/2017 1335   CO2 30 12/14/2022 1103   CO2 28 02/14/2017 1335   BUN 14 12/14/2022 1103   BUN 7 (L) 02/14/2017 1335   CREATININE 0.70 12/14/2022 1103   CREATININE 0.47 (L) 02/14/2017 1335   CREATININE 0.56 07/24/2014 1204      Component Value Date/Time   CALCIUM 9.1 12/14/2022 1103   CALCIUM 9.3 02/14/2017 1335   ALKPHOS 57 12/14/2022 1103   ALKPHOS 68 02/14/2017 1335   AST 16 12/14/2022 1103   ALT 8 12/14/2022 1103   BILITOT 1.1 12/14/2022 1103     Encounter Diagnoses  Name Primary?   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  Repeated BP is improved with systolic in the 170s- Nplate today. I have asked that she contact Rachael Buck PCP today to set up a follow up this week and also to go home and read the bottle of medication she thought she was to stop and alert [place]. BP is too high which we discussed. Red flags reviewed.   She would qualify for iron infusions when Rachael Buck blood pressure is better controlled.   We will go ahead and plan to get Rachael Buck back in another month or so.  Update- Patient called our office a short while later stating that she got Rachael Buck blood pressure medication and glipizide confused. We discussed that she  should not stop Rachael Buck blood pressure medications- she will restart them today and follow up with PCP.    Rushie Chestnut, PA-C 4/16/202412:11 PM

## 2022-12-14 NOTE — Patient Instructions (Signed)
Romiplostim Injection What is this medication? ROMIPLOSTIM ([place] mi PLOE stim) treats low levels of platelets in your body caused by immune thrombocytopenia (ITP). It is prescribed when other medications have not worked or cannot be tolerated. It may also be used to help people who have been exposed to high doses of radiation. It works by increasing the amount of platelets in your blood. This lowers the risk of bleeding. This medicine may be used for other purposes; ask your health care provider or pharmacist if you have questions. COMMON BRAND NAME(S): Nplate What should I tell my care team before I take this medication? They need to know if you have any of these conditions: Blood clots Myelodysplastic syndrome An unusual or allergic reaction to romiplostim, mannitol, other medications, foods, dyes, or preservatives Pregnant or trying to get pregnant Breast-feeding How should I use this medication? This medication is injected under the skin. It is given by a care team in a hospital or clinic setting. A special MedGuide will be given to you before each treatment. Be sure to read this information carefully each time. Talk to your care team about the use of this medication in children. While it may be prescribed for children as young as newborns for selected conditions, precautions do apply. Overdosage: If you think you have taken too much of this medicine contact a poison control center or emergency room at once. NOTE: This medicine is only for you. Do not share this medicine with others. What if I miss a dose? Keep appointments for follow-up doses. It is important not to miss your dose. Call your care team if you are unable to keep an appointment. What may interact with this medication? Interactions are not expected. This list may not describe all possible interactions. Give your health care provider a list of all the medicines, herbs, non-prescription drugs, or dietary supplements you use. Also  tell them if you smoke, drink alcohol, or use illegal drugs. Some items may interact with your medicine. What should I watch for while using this medication? Visit your care team for regular checks on your progress. You may need blood work done while you are taking this medication. Your condition will be monitored carefully while you are receiving this medication. It is important not to miss any appointments. What side effects may I notice from receiving this medication? Side effects that you should report to your care team as soon as possible: Allergic reactions--skin rash, itching, hives, swelling of the face, lips, tongue, or throat Blood clot--pain, swelling, or warmth in the leg, shortness of breath, chest pain Side effects that usually do not require medical attention (report to your care team if they continue or are bothersome): Dizziness Joint pain Muscle pain Pain in the hands or feet Stomach pain Trouble sleeping This list may not describe all possible side effects. Call your doctor for medical advice about side effects. You may report side effects to FDA at 1-800-FDA-1088. Where should I keep my medication? This medication is given in a hospital or clinic. It will not be stored at home. NOTE: This sheet is a summary. It may not cover all possible information. If you have questions about this medicine, talk to your doctor, pharmacist, or health care provider.  2023 Elsevier/Gold Standard (2021-11-24 00:00:00)  

## 2022-12-14 NOTE — Telephone Encounter (Signed)
Called pt per Maralyn Sago, [place] advised pt to call PCP regarding elevated BP and what medication(s) she was or is taking for this.  Informed pt to also mention the constant headaches she has been having. Pt confirmed she will call PCP today to discuss. Encouraged pt to call our office with any concerns.

## 2022-12-14 NOTE — Telephone Encounter (Signed)
I called pt back as I was concerned about her blood pressure.  She checked her medications, she is taking losartan but has run out of hydrochlorothiazide.  I will send a new prescription for HCTZ to her pharmacy, advised her to take an extra 25 mg of losartan that she has on hand today-will pick up HCTZ tomorrow BP Readings from Last 3 Encounters:  12/14/22 (!) 162/70  12/14/22 (!) 186/67  11/18/22 (!) 151/110   Please see the MyChart message reply(ies) for my assessment and plan.  The patient gave consent for this Medical Advice Message and is aware that it may result in a bill to their insurance company as well as the possibility that this may result in a co-payment or deductible. They are an established patient, but are not seeking medical advice exclusively about a problem treated during an in person or video visit in the last 7 days. I did not recommend an in person or video visit within 7 days of my reply.  I spent a total of 10 minutes cumulative time within 7 days through MyChart messaging Abbe Amsterdam, MD

## 2022-12-15 ENCOUNTER — Telehealth: Payer: Self-pay | Admitting: Family Medicine

## 2022-12-15 ENCOUNTER — Encounter: Payer: Self-pay | Admitting: Hematology & Oncology

## 2022-12-15 NOTE — Telephone Encounter (Signed)
Contacted Rachael Buck to schedule their annual wellness visit. Appointment made for 01/04/2023.  Rachael Buck; Care Guide Ambulatory Clinical Support [place] [facility] Direct Dial: [phone]

## 2022-12-18 NOTE — Progress Notes (Unsigned)
[facility] [address] [place], [place] [address] [phone]  Date:  12/20/2022   Name:  Rachael Buck   DOB:  1941-05-06   MRN:  [id number]  PCP:  Pearline Cables, MD    Chief Complaint: No chief complaint on file.   History of Present Illness:  Rachael Buck is a 82 y.o. 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  We touched base last week-patient called in with concern of elevated blood pressure at hematology office We found that she had run out of her hydrochlorothiazide at that time, I added it back to her regimen, continue losartan  Labs done February 16-CMP, iron studies, CBC-platelet count 54  Lab Results  Component Value Date   HGBA1C 6.3 (H) 10/07/2022    Patient Active Problem List   Diagnosis Date Noted   Hypoxemia 06/24/2022   Pulmonary nodule 1 cm or greater in diameter 01/13/2021   Severe sepsis 12/09/2020   Hypokalemia 12/09/2020   Cirrhosis of liver 02/17/2018   S/P total knee replacement, 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   Thrombocytopenia 02/15/2017   Varicose veins of left lower extremity with complications 11/21/2015   Age-related cognitive decline 06/02/2015   Fatigue 07/19/2014   Orthostatic hypotension 05/14/2013   Nail abnormality 09/04/2012   Bleeding disorder 05/08/2012   Varices, 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   NASH (nonalcoholic steatohepatitis) 03/21/2012   HTN (hypertension) 12/21/2011   Depression 03/28/2011   IBS (irritable bowel syndrome) 03/23/2011   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/27/2010   DYSPNEA  ON EXERTION 07/27/2010   Uncontrolled diabetes mellitus 07/24/2010   MIXED HYPERLIPIDEMIA 07/24/2010   Obesity, unspecified 07/24/2010   ALLERGIC RHINITIS CAUSE UNSPECIFIED 07/24/2010   REFLUX ESOPHAGITIS 07/24/2010   PAIN IN JOINT PELVIC REGION AND THIGH 07/24/2010    Past Medical History:  Diagnosis Date   Anxiety    Arthritis    Asthma    Cirrhosis, nonalcoholic (HCC)    Colon polyps    Diabetes mellitus    Diverticulitis    GERD (gastroesophageal reflux disease)    Hyperlipidemia    Hypertension    NASH (nonalcoholic steatohepatitis)    Obesity    Pneumonia     Past Surgical History:  Procedure Laterality Date   CHOLECYSTECTOMY  1981   ENDOVENOUS ABLATION SAPHENOUS VEIN W/ LASER Left 02/21/2018   endovenous laser ablation left greater saphenous vein by Josephina Gip MD    OOPHORECTOMY  1973   PARTIAL HYSTERECTOMY  1972   ROTATOR CUFF REPAIR     bilateral. 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   Procedure: LEFT TOTAL KNEE ARTHROPLASTY;  Surgeon: Beverely Low, MD;  Location: [facility] OR;  Service: Orthopedics;  Laterality: Left;   VEIN SURGERY      Social History   Tobacco Use   Smoking status: Former    Packs/day: 1.50    Years: 15.00    Additional pack years: 0.00    Total pack years: 22.50    Types: Cigarettes   Smokeless tobacco: Never   Tobacco comments:    Quit 2002.  Vaping Use  Vaping Use: Never used  Substance Use Topics   Alcohol use: No   Drug use: No    Family History  Problem Relation Age of Onset   Stomach cancer Mother    Diabetes Sister    Breast cancer Daughter 21   Breast cancer Maternal Aunt    Diabetes Brother    Cancer Other    GER disease Other    Obesity Other     Allergies  Allergen Reactions   Codeine Nausea And Vomiting   Metformin And Related Diarrhea   Shrimp [Shellfish Allergy] Swelling    Medication list has been reviewed and updated.  Current Outpatient  Medications on File Prior to Visit  Medication Sig Dispense Refill   acetaminophen (TYLENOL) 500 MG tablet Take 500 mg by mouth every 6 (six) hours as needed for mild pain.     albuterol (PROAIR HFA) 108 (90 Base) MCG/ACT inhaler Inhale 2 puffs into the lungs every 4 (four) hours as needed for wheezing or shortness of breath. 1 Inhaler 6   ALPRAZolam (XANAX) 0.5 MG tablet TAKE 1 TABLET(0.5 MG) BY MOUTH THREE TIMES DAILY 90 tablet 3   blood glucose meter kit and supplies KIT Dispense based on patient and insurance preference. Use up to four times daily as directed. 1 each 0   colestipol (COLESTID) 1 g tablet 1 g daily.     dapagliflozin propanediol (FARXIGA) 5 MG TABS tablet Take 1 tablet (5 mg total) by mouth daily before breakfast. 90 tablet 4   glipiZIDE (GLUCOTROL XL) 5 MG 24 hr tablet TAKE 1 TABLET(5 MG) BY MOUTH DAILY WITH BREAKFAST (Patient not taking: Reported on 11/18/2022) 90 tablet 3   glucose blood (ONETOUCH ULTRA) test strip TEST BLOOD SUGAR(S) ONCE DAILY IN THE MORNING 100 strip 12   hydrochlorothiazide (HYDRODIURIL) 25 MG tablet TAKE 1 TABLET(25 MG) BY MOUTH DAILY 90 tablet 3   hydrocortisone 2.5 % cream Apply 1 application topically daily as needed (itchy dry skin).     ipratropium-albuterol (DUONEB) 0.5-2.5 (3) MG/3ML SOLN Use one 3 ml neb treatment as needed every 6-8 hours prn (Patient not taking: Reported on 11/18/2022) 360 mL 1   loperamide (IMODIUM) 2 MG capsule Take 4-6 mg by mouth daily as needed (for IBS symptoms).     losartan (COZAAR) 50 MG tablet Take 1 tablet (50 mg total) by mouth daily. 90 tablet 3   Melatonin 10 MG TABS Take 10 mg by mouth at bedtime.     ONETOUCH DELICA LANCETS FINE MISC 1 each by Other route daily. 100 each 12   potassium chloride (KLOR-CON M) 10 MEQ tablet Take 1 tablet (10 mEq total) by mouth daily. 30 tablet 3   sertraline (ZOLOFT) 100 MG tablet Take 1 tablet (100 mg total) by mouth daily. 90 tablet 1   simvastatin (ZOCOR) 80 MG tablet Take 1  tablet (80 mg total) by mouth daily. 90 tablet 1   sucralfate (CARAFATE) 1 g tablet Take 1 g by mouth 4 (four) times daily as needed.     Tiotropium Bromide-Olodaterol (STIOLTO RESPIMAT) 2.5-2.5 MCG/ACT AERS Inhale 2 puffs into the lungs daily. 4 g 5   traMADol (ULTRAM) 50 MG tablet TAKE 1 TABLET(50 MG) BY MOUTH EVERY 6 HOURS AS NEEDED 60 tablet 0   Current Facility-Administered Medications on File Prior to Visit  Medication Dose Route Frequency Provider Last Rate Last Admin   0.9 %  sodium chloride infusion   Intravenous Continuous Curcio, Reita May, NP  Review of Systems:  As per HPI- otherwise negative.   Physical Examination: There were no vitals filed for this visit. There were no vitals filed for this visit. There is no height or weight on file to calculate BMI. Ideal Body Weight:    GEN: no acute distress. HEENT: Atraumatic, Normocephalic.  Ears and Nose: No external deformity. CV: RRR, No M/G/R. No JVD. No thrill. No extra heart sounds. PULM: CTA B, no wheezes, crackles, rhonchi. No retractions. No resp. distress. No accessory muscle use. ABD: S, NT, ND, +BS. No rebound. No HSM. EXTR: No c/c/e PSYCH: Normally interactive. Conversant.    Assessment and Plan: ***  Signed Lamar Blinks, MD

## 2022-12-20 ENCOUNTER — Ambulatory Visit (INDEPENDENT_AMBULATORY_CARE_PROVIDER_SITE_OTHER): Payer: PPO | Admitting: Family Medicine

## 2022-12-20 ENCOUNTER — Encounter: Payer: Self-pay | Admitting: Hematology & Oncology

## 2022-12-20 VITALS — BP 128/62 | HR 95 | Temp 97.7°F | Resp 18 | Ht 66.0 in | Wt 189.6 lb

## 2022-12-20 DIAGNOSIS — E782 Mixed hyperlipidemia: Secondary | ICD-10-CM | POA: Diagnosis not present

## 2022-12-20 DIAGNOSIS — I10 Essential (primary) hypertension: Secondary | ICD-10-CM | POA: Diagnosis not present

## 2022-12-20 DIAGNOSIS — D696 Thrombocytopenia, unspecified: Secondary | ICD-10-CM | POA: Diagnosis not present

## 2022-12-20 DIAGNOSIS — F32A Depression, unspecified: Secondary | ICD-10-CM | POA: Diagnosis not present

## 2022-12-20 DIAGNOSIS — E119 Type 2 diabetes mellitus without complications: Secondary | ICD-10-CM

## 2022-12-20 MED ORDER — SERTRALINE HCL 100 MG PO TABS: 150.0000 mg | ORAL_TABLET | Freq: Every day | ORAL | 2 refills | Status: DC

## 2022-12-20 NOTE — Patient Instructions (Addendum)
It was good to see you today- you look great, but I am sorry you are struggling with grief  Let's increase your sertraline to 150 mg to see if that may help- please let me know how this works for you  Your BP looks much better- let's continue current medication I think Dr Myna Hidalgo will do this anyway but please ask him if he can check a "BMP" at your next lab visit

## 2022-12-22 ENCOUNTER — Other Ambulatory Visit: Payer: Self-pay | Admitting: Hematology & Oncology

## 2022-12-22 DIAGNOSIS — M25559 Pain in unspecified hip: Secondary | ICD-10-CM

## 2022-12-22 DIAGNOSIS — M545 Low back pain, unspecified: Secondary | ICD-10-CM

## 2022-12-23 ENCOUNTER — Encounter: Payer: Self-pay | Admitting: Hematology & Oncology

## 2023-01-04 ENCOUNTER — Ambulatory Visit (INDEPENDENT_AMBULATORY_CARE_PROVIDER_SITE_OTHER): Payer: PPO | Admitting: *Deleted

## 2023-01-04 VITALS — BP 111/73 | HR 102 | Ht 66.0 in | Wt 184.2 lb

## 2023-01-04 DIAGNOSIS — Z0000 Encounter for general adult medical examination without abnormal findings: Secondary | ICD-10-CM | POA: Diagnosis not present

## 2023-01-04 NOTE — Patient Instructions (Signed)
Rachael Buck , Thank you for taking time to come for your Medicare Wellness Visit. I appreciate your ongoing commitment to your health goals. Please review the following plan we discussed and let me know if I can assist you in the future.     This is a list of the screening recommended for you and due dates:  Health Maintenance  Topic Date Due   Zoster (Shingles) Vaccine (1 of 2) Never done   DTaP/Tdap/Td vaccine (2 - Tdap) 08/30/2017   Colon Cancer Screening  03/30/2020   Eye exam for diabetics  09/20/2020   Complete foot exam   10/29/2021   COVID-19 Vaccine (4 - 2023-24 season) 04/30/2022   Mammogram  05/11/2022   Flu Shot  03/31/2023   Hemoglobin A1C  04/07/2023   Yearly kidney health urinalysis for diabetes  06/10/2023   Yearly kidney function blood test for diabetes  12/14/2023   Medicare Annual Wellness Visit  01/04/2024   Pneumonia Vaccine  Completed   DEXA scan (bone density measurement)  Completed   HPV Vaccine  Aged Out   Screening for Lung Cancer  Discontinued     Next appointment: Follow up in one year for your annual wellness visit.   Preventive Care 13 Years and Older, Female Preventive care refers to lifestyle choices and visits with your health care provider that can promote health and wellness. What does preventive care include? A yearly physical exam. This is also called an annual well check. Dental exams once or twice a year. Routine eye exams. Ask your health care provider how often you should have your eyes checked. Personal lifestyle choices, including: Daily care of your teeth and gums. Regular physical activity. Eating a healthy diet. Avoiding tobacco and drug use. Limiting alcohol use. Practicing safe sex. Taking low-dose aspirin every day. Taking vitamin and mineral supplements as recommended by your health care provider. What happens during an annual well check? The services and screenings done by your health care provider during your annual  well check will depend on your age, overall health, lifestyle risk factors, and family history of disease. Counseling  Your health care provider may ask you questions about your: Alcohol use. Tobacco use. Drug use. Emotional well-being. Home and relationship well-being. Sexual activity. Eating habits. History of falls. Memory and ability to understand (cognition). Work and work Astronomer. Reproductive health. Screening  You may have the following tests or measurements: Height, weight, and BMI. Blood pressure. Lipid and cholesterol levels. These may be checked every 5 years, or more frequently if you are over 5 years old. Skin check. Lung cancer screening. You may have this screening every year starting at age 44 if you have a 30-pack-year history of smoking and currently smoke or have quit within the past 15 years. Fecal occult blood test (FOBT) of the stool. You may have this test every year starting at age 42. Flexible sigmoidoscopy or colonoscopy. You may have a sigmoidoscopy every 5 years or a colonoscopy every 10 years starting at age 84. Hepatitis C blood test. Hepatitis B blood test. Sexually transmitted disease (STD) testing. Diabetes screening. This is done by checking your blood sugar (glucose) after you have not eaten for a while (fasting). You may have this done every 1-3 years. Bone density scan. This is done to screen for osteoporosis. You may have this done starting at age 55. Mammogram. This may be done every 1-2 years. Talk to your health care provider about how often you should have regular mammograms. Talk  with your health care provider about your test results, treatment options, and if necessary, the need for more tests. Vaccines  Your health care provider may recommend certain vaccines, such as: Influenza vaccine. This is recommended every year. Tetanus, diphtheria, and acellular pertussis (Tdap, Td) vaccine. You may need a Td booster every 10 years. Zoster  vaccine. You may need this after age 38. Pneumococcal 13-valent conjugate (PCV13) vaccine. One dose is recommended after age 52. Pneumococcal polysaccharide (PPSV23) vaccine. One dose is recommended after age 42. Talk to your health care provider about which screenings and vaccines you need and how often you need them. This information is not intended to replace advice given to you by your health care provider. Make sure you discuss any questions you have with your health care provider. Document Released: 09/12/2015 Document Revised: 05/05/2016 Document Reviewed: 06/17/2015 Elsevier Interactive Patient Education  2017 [place] Prevention in the Home Falls can cause injuries. They can happen to people of all ages. There are many things you can do to make your home safe and to help prevent falls. What can I do on the outside of my home? Regularly fix the edges of walkways and driveways and fix any cracks. Remove anything that might make you trip as you walk through a door, such as a raised step or threshold. Trim any bushes or trees on the path to your home. Use bright outdoor lighting. Clear any walking paths of anything that might make someone trip, such as rocks or tools. Regularly check to see if handrails are loose or broken. Make sure that both sides of any steps have handrails. Any raised decks and porches should have guardrails on the edges. Have any leaves, snow, or ice cleared regularly. Use sand or salt on walking paths during winter. Clean up any spills in your garage right away. This includes oil or grease spills. What can I do in the bathroom? Use night lights. Install grab bars by the toilet and in the tub and shower. Do not use towel bars as grab bars. Use non-skid mats or decals in the tub or shower. If you need to sit down in the shower, use a plastic, non-slip stool. Keep the floor dry. Clean up any water that spills on the floor as soon as it happens. Remove  soap buildup in the tub or shower regularly. Attach bath mats securely with double-sided non-slip rug tape. Do not have throw rugs and other things on the floor that can make you trip. What can I do in the bedroom? Use night lights. Make sure that you have a light by your bed that is easy to reach. Do not use any sheets or blankets that are too big for your bed. They should not hang down onto the floor. Have a firm chair that has side arms. You can use this for support while you get dressed. Do not have throw rugs and other things on the floor that can make you trip. What can I do in the kitchen? Clean up any spills right away. Avoid walking on wet floors. Keep items that you use a lot in easy-to-reach places. If you need to reach something above you, use a strong step stool that has a grab bar. Keep electrical cords out of the way. Do not use floor polish or wax that makes floors slippery. If you must use wax, use non-skid floor wax. Do not have throw rugs and other things on the floor that can make you  trip. What can I do with my stairs? Do not leave any items on the stairs. Make sure that there are handrails on both sides of the stairs and use them. Fix handrails that are broken or loose. Make sure that handrails are as long as the stairways. Check any carpeting to make sure that it is firmly attached to the stairs. Fix any carpet that is loose or worn. Avoid having throw rugs at the top or bottom of the stairs. If you do have throw rugs, attach them to the floor with carpet tape. Make sure that you have a light switch at the top of the stairs and the bottom of the stairs. If you do not have them, ask someone to add them for you. What else can I do to help prevent falls? Wear shoes that: Do not have high heels. Have rubber bottoms. Are comfortable and fit you well. Are closed at the toe. Do not wear sandals. If you use a stepladder: Make sure that it is fully opened. Do not climb a  closed stepladder. Make sure that both sides of the stepladder are locked into place. Ask someone to hold it for you, if possible. Clearly mark and make sure that you can see: Any grab bars or handrails. First and last steps. Where the edge of each step is. Use tools that help you move around (mobility aids) if they are needed. These include: Canes. Walkers. Scooters. Crutches. Turn on the lights when you go into a dark area. Replace any light bulbs as soon as they burn out. Set up your furniture so you have a clear path. Avoid moving your furniture around. If any of your floors are uneven, fix them. If there are any pets around you, be aware of where they are. Review your medicines with your doctor. Some medicines can make you feel dizzy. This can increase your chance of falling. Ask your doctor what other things that you can do to help prevent falls. This information is not intended to replace advice given to you by your health care provider. Make sure you discuss any questions you have with your health care provider. Document Released: 06/12/2009 Document Revised: 01/22/2016 Document Reviewed: 09/20/2014 Elsevier Interactive Patient Education  2017 [employer].

## 2023-01-04 NOTE — Progress Notes (Signed)
Subjective:   Rachael Buck is a 82 y.o. female who presents for Medicare Annual (Subsequent) preventive examination.    Review of Systems     Cardiac Risk Factors include: advanced age (>28men, >11 women);hypertension;dyslipidemia;diabetes mellitus     Objective:    Today's Vitals   01/04/23 1340  BP: 111/73  Pulse: (!) 102  Weight: 184 lb 3.2 oz (83.6 kg)  Height: 5\' 6"  (1.676 m)   Body mass index is 29.73 kg/m.     01/04/2023    1:44 PM 11/18/2022   12:15 PM 10/07/2022   10:55 AM 08/12/2022   10:59 AM 07/13/2022   12:00 PM 06/03/2022   10:40 AM 04/08/2022   10:26 AM  Advanced Directives  Does Patient Have a Medical Advance Directive? Yes Yes Yes Yes Yes No Yes  Type of Estate agent of [place];Living will Healthcare Power of [place];Living will Healthcare Power of [place];Living will Living will Living will  Living will  Does patient want to make changes to medical advance directive?     No - Patient declined    Copy of Healthcare Power of Attorney in Chart? No - copy requested No - copy requested No - copy requested No - copy requested     Would patient like information on creating a medical advance directive?    No - Patient declined No - Patient declined      Current Medications (verified) Outpatient Encounter Medications as of 01/04/2023  Medication Sig   acetaminophen (TYLENOL) 500 MG tablet Take 500 mg by mouth every 6 (six) hours as needed for mild pain.   albuterol (PROAIR HFA) 108 (90 Base) MCG/ACT inhaler Inhale 2 puffs into the lungs every 4 (four) hours as needed for wheezing or shortness of breath.   ALPRAZolam (XANAX) 0.5 MG tablet TAKE 1 TABLET(0.5 MG) BY MOUTH THREE TIMES DAILY   blood glucose meter kit and supplies KIT Dispense based on patient and insurance preference. Use up to four times daily as directed.   colestipol (COLESTID) 1 g tablet 1 g daily.   dapagliflozin propanediol (FARXIGA) 5 MG TABS tablet Take 1 tablet (5 mg  total) by mouth daily before breakfast.   glipiZIDE (GLUCOTROL XL) 5 MG 24 hr tablet TAKE 1 TABLET(5 MG) BY MOUTH DAILY WITH BREAKFAST   glucose blood (ONETOUCH ULTRA) test strip TEST BLOOD SUGAR(S) ONCE DAILY IN THE MORNING   hydrochlorothiazide (HYDRODIURIL) 25 MG tablet TAKE 1 TABLET(25 MG) BY MOUTH DAILY   hydrocortisone 2.5 % cream Apply 1 application topically daily as needed (itchy dry skin).   ipratropium-albuterol (DUONEB) 0.5-2.5 (3) MG/3ML SOLN Use one 3 ml neb treatment as needed every 6-8 hours prn   loperamide (IMODIUM) 2 MG capsule Take 4-6 mg by mouth daily as needed (for IBS symptoms).   losartan (COZAAR) 50 MG tablet Take 1 tablet (50 mg total) by mouth daily.   Melatonin 10 MG TABS Take 10 mg by mouth at bedtime.   ONETOUCH DELICA LANCETS FINE MISC 1 each by Other route daily.   potassium chloride (KLOR-CON M) 10 MEQ tablet Take 1 tablet (10 mEq total) by mouth daily.   sertraline (ZOLOFT) 100 MG tablet Take 1.5 tablets (150 mg total) by mouth daily.   simvastatin (ZOCOR) 80 MG tablet Take 1 tablet (80 mg total) by mouth daily.   sucralfate (CARAFATE) 1 g tablet Take 1 g by mouth 4 (four) times daily as needed.   Tiotropium Bromide-Olodaterol (STIOLTO RESPIMAT) 2.5-2.5 MCG/ACT AERS Inhale 2  puffs into the lungs daily.   traMADol (ULTRAM) 50 MG tablet TAKE 1 TABLET(50 MG) BY MOUTH EVERY 6 HOURS AS NEEDED   Facility-Administered Encounter Medications as of 01/04/2023  Medication   0.9 %  sodium chloride infusion    Allergies (verified) Codeine, Metformin and related, and Shrimp [shellfish allergy]   History: Past Medical History:  Diagnosis Date   Anxiety    Arthritis    Asthma    Cirrhosis, nonalcoholic (HCC)    Colon polyps    Diabetes mellitus    Diverticulitis    GERD (gastroesophageal reflux disease)    Hyperlipidemia    Hypertension    NASH (nonalcoholic steatohepatitis)    Obesity    Pneumonia    Past Surgical History:  Procedure Laterality Date    CHOLECYSTECTOMY  1981   ENDOVENOUS ABLATION SAPHENOUS VEIN W/ LASER Left 02/21/2018   endovenous laser ablation left greater saphenous vein by Josephina Gip MD    OOPHORECTOMY  1973   PARTIAL HYSTERECTOMY  1972   ROTATOR CUFF REPAIR     bilateral. 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   Procedure: LEFT TOTAL KNEE ARTHROPLASTY;  Surgeon: Beverely Low, MD;  Location[facility] OR;  Service: Orthopedics;  Laterality: Left;   VEIN SURGERY     Family History  Problem Relation Age of Onset   Stomach cancer Mother    Diabetes Sister    Breast cancer Daughter 2   Breast cancer Maternal Aunt    Diabetes Brother    Cancer Other    GER disease Other    Obesity Other    Social History   Socioeconomic History   Marital status: Widowed    Spouse name: Not on file   Number of children: Not on file   Years of education: Not on file   Highest education level: Not on file  Occupational History   Not on file  Tobacco Use   Smoking status: Former    Packs/day: 1.50    Years: 15.00    Additional pack years: 0.00    Total pack years: 22.50    Types: Cigarettes   Smokeless tobacco: Never   Tobacco comments:    Quit 2002.  Vaping Use   Vaping Use: Never used  Substance and Sexual Activity   Alcohol use: No   Drug use: No   Sexual activity: Not on file  Other Topics Concern   Not on file  Social History Narrative   Not on file   Social Determinants of Health   Financial Resource Strain: Low Risk  (12/29/2021)   Overall Financial Resource Strain (CARDIA)    Difficulty of Paying Living Expenses: Not hard at all  Food Insecurity: No Food Insecurity (03/29/2022)   Hunger Vital Sign    Worried About Running Out of Food in the Last Year: Never true    Ran Out of Food in the Last Year: Never true  Transportation Needs: No Transportation Needs (03/29/2022)   PRAPARE - Administrator, Civil Service (Medical): No    Lack of  Transportation (Non-Medical): No  Physical Activity: Inactive (12/29/2021)   Exercise Vital Sign    Days of Exercise per Week: 0 days    Minutes of Exercise per Session: 0 min  Stress: No Stress Concern Present (12/29/2021)   [employer] of Occupational Health - Occupational Stress Questionnaire    Feeling of Stress : Not at all  Social Connections:  Moderately Integrated (12/29/2021)   Social Connection and Isolation Panel [NHANES]    Frequency of Communication with Friends and Family: More than three times a week    Frequency of Social Gatherings with Friends and Family: Once a week    Attends Religious Services: More than 4 times per year    Active Member of [employer] or Organizations: No    Attends Banker Meetings: Never    Marital Status: Married    Tobacco Counseling Counseling given: Not Answered Tobacco comments: Quit 2002.   Clinical Intake:  Pre-visit preparation completed: Yes  Pain : No/denies pain  BMI - recorded: 29.73 Nutritional Status: BMI 25 -29 Overweight Nutritional Risks: None Diabetes: Yes CBG done?: No Did pt. bring in CBG monitor from home?: No  How often do you need to have someone help you when you read instructions, pamphlets, or other written materials from your doctor or pharmacy?: 1 - Never  Activities of Daily Living    01/04/2023    1:49 PM  In your present state of health, do you have any difficulty performing the following activities:  Hearing? 1  Comment wears hearing aids  Vision? 0  Difficulty concentrating or making decisions? 1  Comment slight forgetfullness  Walking or climbing stairs? 1  Dressing or bathing? 0  Doing errands, shopping? 0  Preparing Food and eating ? N  Using the Toilet? N  In the past six months, have you accidently leaked urine? N  Do you have problems with loss of bowel control? Y  Managing your Medications? N  Managing your Finances? N  Housekeeping or managing your Housekeeping? N     Patient Care Team: Copland, Gwenlyn Found, MD as PCP - General (Family Medicine) Vida Rigger, MD as Consulting Physician (Gastroenterology) Manning Charity, OD as Referring Physician (Optometry) Beverely Low, MD as Consulting Physician (Orthopedic Surgery) Donzetta Starch, MD as Consulting Physician (Dermatology) Henrene Pastor, RPH-CPP (Pharmacist)  Indicate any recent Medical Services you may have received from other than [facility] providers in the past year (date may be approximate).     Assessment:   This is a routine wellness examination for [place].  Hearing/Vision screen No results found.  Dietary issues and exercise activities discussed: Current Exercise Habits: The patient does not participate in regular exercise at present, Exercise limited by: None identified   Goals Addressed   None    Depression Screen    01/04/2023    1:48 PM 12/29/2021    1:54 PM 09/20/2017   10:03 AM 09/20/2017    9:27 AM 06/17/2017    9:33 AM 08/24/2016    9:06 AM 09/16/2015    8:37 AM  PHQ 2/9 Scores  PHQ - 2 Score 1 0 2 2 0 0 0  PHQ- 9 Score   8 8 0 0   Exception Documentation       Patient refusal    Fall Risk    01/04/2023    1:48 PM 12/29/2021    1:56 PM 10/29/2020    2:22 PM 07/25/2019   10:39 AM 09/20/2017   10:04 AM  Fall Risk   Falls in the past year? 0 0 1 1 No  Comment    Emmi Telephone Survey: data to providers prior to load   Number falls in past yr: 0 0 1 1   Comment    Emmi Telephone Survey Actual Response = 1   Injury with Fall? 0 0 0 0   Risk for fall due to :  No Fall Risks Impaired vision;Impaired balance/gait     Follow up Falls evaluation completed Falls prevention discussed       FALL RISK PREVENTION PERTAINING TO THE HOME:  Any stairs in or around the home? Yes  If so, are there any without handrails? Yes  Home free of loose throw rugs in walkways, pet beds, electrical cords, etc? Yes  Adequate lighting in your home to reduce risk of falls? Yes   ASSISTIVE DEVICES  UTILIZED TO PREVENT FALLS:  Life alert? No  Use of a cane, walker or w/c? No  Grab bars in the bathroom? Yes  Shower chair or bench in shower? Yes  Elevated toilet seat or a handicapped toilet? Yes   TIMED UP AND GO:  Was the test performed? Yes .  Length of time to ambulate 10 feet: 6 sec.   Gait steady and fast without use of assistive device  Cognitive Function:    09/20/2017    9:33 AM  MMSE - Mini Mental State Exam  Orientation to time 5  Orientation to Place 5  Registration 3  Attention/ Calculation 5  Recall 1  Language- name 2 objects 2  Language- repeat 1  Language- follow 3 step command 3  Language- read & follow direction 1  Write a sentence 1  Copy design 1  Total score 28        01/04/2023    1:59 PM 12/29/2021    1:57 PM  6CIT Screen  What Year? 0 points 0 points  What month? 0 points 0 points  What time? 0 points 0 points  Count back from 20 0 points 0 points  Months in reverse 0 points 0 points  Repeat phrase 2 points 0 points  Total Score 2 points 0 points    Immunizations Immunization History  Administered Date(s) Administered   Fluad Quad(high Dose 65+) 10/29/2020, 06/17/2021, 06/09/2022   Hepatitis B 08/10/2012, 09/11/2012, 11/23/2012   Influenza Split 06/08/2010   Influenza Whole 05/30/2010   Influenza, High Dose Seasonal PF 05/13/2015, 06/07/2018   Influenza,inj,Quad PF,6+ Mos 08/24/2016, 06/17/2017, 06/06/2019   Moderna Sars-Covid-2 Vaccination 09/11/2019, 10/09/2019, 05/30/2020   Pneumococcal Conjugate-13 02/11/2015   Pneumococcal Polysaccharide-23 05/30/2008   Td 08/31/2007    TDAP status: Due, Education has been provided regarding the importance of this vaccine. Advised may receive this vaccine at local pharmacy or Health Dept. Aware to provide a copy of the vaccination record if obtained from local pharmacy or Health Dept. Verbalized acceptance and understanding.  Flu Vaccine status: Up to date  Pneumococcal vaccine status: Up  to date  Covid-19 vaccine status: Information provided on how to obtain vaccines.   Qualifies for Shingles Vaccine? Yes   Zostavax completed No   Shingrix Completed?: No.    Education has been provided regarding the importance of this vaccine. Patient has been advised to call insurance company to determine out of pocket expense if they have not yet received this vaccine. Advised may also receive vaccine at local pharmacy or Health Dept. Verbalized acceptance and understanding.  Screening Tests Health Maintenance  Topic Date Due   Zoster Vaccines- Shingrix (1 of 2) Never done   DTaP/Tdap/Td (2 - Tdap) 08/30/2017   COLONOSCOPY (Pts 45-59yrs Insurance coverage will need to be confirmed)  03/30/2020   OPHTHALMOLOGY EXAM  09/20/2020   FOOT EXAM  10/29/2021   COVID-19 Vaccine (4 - 2023-24 season) 04/30/2022   MAMMOGRAM  05/11/2022   Medicare Annual Wellness (AWV)  12/30/2022   INFLUENZA VACCINE  03/31/2023   HEMOGLOBIN A1C  04/07/2023   Diabetic kidney evaluation - Urine ACR  06/10/2023   Diabetic kidney evaluation - eGFR measurement  12/14/2023   Pneumonia Vaccine 46+ Years old  Completed   DEXA SCAN  Completed   HPV VACCINES  Aged Out   Lung Cancer Screening  Discontinued    Health Maintenance  Health Maintenance Due  Topic Date Due   Zoster Vaccines- Shingrix (1 of 2) Never done   DTaP/Tdap/Td (2 - Tdap) 08/30/2017   COLONOSCOPY (Pts 45-30yrs Insurance coverage will need to be confirmed)  03/30/2020   OPHTHALMOLOGY EXAM  09/20/2020   FOOT EXAM  10/29/2021   COVID-19 Vaccine (4 - 2023-24 season) 04/30/2022   MAMMOGRAM  05/11/2022   Medicare Annual Wellness (AWV)  12/30/2022    Colorectal cancer screening: Type of screening: Colonoscopy. Completed 03/31/15. Repeat every 5 years  Mammogram status: Completed 05/11/21. Repeat every year  Bone Density status: Pt declined.  Lung Cancer Screening: (Low Dose CT Chest recommended if Age 34-80 years, 30 pack-year currently smoking OR  have quit w/in 15years.) does not qualify.   Additional Screening:  Hepatitis C Screening: does qualify; Completed N/a  Vision Screening: Recommended annual ophthalmology exams for early detection of glaucoma and other disorders of the eye. Is the patient up to date with their annual eye exam?  Yes  Who is the provider or what is the name of the office in which the patient attends annual eye exams? Dr. Emily Filbert If pt is not established with a provider, would they like to be referred to a provider to establish care? No .   Dental Screening: Recommended annual dental exams for proper oral hygiene  Community Resource Referral / Chronic Care Management: CRR required this visit?  No   CCM required this visit?  No      Plan:     I have personally reviewed and noted the following in the patient's chart:   Medical and social history Use of alcohol, tobacco or illicit drugs  Current medications and supplements including opioid prescriptions. Patient is currently taking opioid prescriptions. Information provided to patient regarding non-opioid alternatives. Patient advised to discuss non-opioid treatment plan with their provider. Functional ability and status Nutritional status Physical activity Advanced directives List of other physicians Hospitalizations, surgeries, and ER visits in previous 12 months Vitals Screenings to include cognitive, depression, and falls Referrals and appointments  In addition, I have reviewed and discussed with patient certain preventive protocols, quality metrics, and best practice recommendations. A written personalized care plan for preventive services as well as general preventive health recommendations were provided to patient.   Due to this being a telephonic visit, the after visit summary with patients personalized plan was offered to patient via mail or my-chart. Patient would like to access on my-chart.  Donne Anon, [place]   01/04/2023   Nurse Notes:  None

## 2023-01-07 ENCOUNTER — Other Ambulatory Visit: Payer: Self-pay | Admitting: Family Medicine

## 2023-01-10 DIAGNOSIS — K7469 Other cirrhosis of liver: Secondary | ICD-10-CM | POA: Diagnosis not present

## 2023-01-10 DIAGNOSIS — K766 Portal hypertension: Secondary | ICD-10-CM | POA: Diagnosis not present

## 2023-01-11 ENCOUNTER — Other Ambulatory Visit: Payer: Self-pay | Admitting: Family

## 2023-01-11 DIAGNOSIS — D500 Iron deficiency anemia secondary to blood loss (chronic): Secondary | ICD-10-CM

## 2023-01-11 DIAGNOSIS — D696 Thrombocytopenia, unspecified: Secondary | ICD-10-CM

## 2023-01-12 ENCOUNTER — Inpatient Hospital Stay (HOSPITAL_BASED_OUTPATIENT_CLINIC_OR_DEPARTMENT_OTHER): Payer: PPO | Admitting: Family

## 2023-01-12 ENCOUNTER — Encounter: Payer: Self-pay | Admitting: Family

## 2023-01-12 ENCOUNTER — Inpatient Hospital Stay: Payer: PPO | Attending: Family

## 2023-01-12 ENCOUNTER — Inpatient Hospital Stay: Payer: PPO

## 2023-01-12 VITALS — BP 117/55 | HR 86 | Temp 98.7°F | Resp 18 | Wt 187.1 lb

## 2023-01-12 VITALS — BP 131/57 | HR 84 | Resp 18

## 2023-01-12 DIAGNOSIS — D500 Iron deficiency anemia secondary to blood loss (chronic): Secondary | ICD-10-CM | POA: Diagnosis not present

## 2023-01-12 DIAGNOSIS — D693 Immune thrombocytopenic purpura: Secondary | ICD-10-CM | POA: Insufficient documentation

## 2023-01-12 DIAGNOSIS — D696 Thrombocytopenia, unspecified: Secondary | ICD-10-CM

## 2023-01-12 DIAGNOSIS — K7581 Nonalcoholic steatohepatitis (NASH): Secondary | ICD-10-CM | POA: Insufficient documentation

## 2023-01-12 DIAGNOSIS — E611 Iron deficiency: Secondary | ICD-10-CM | POA: Insufficient documentation

## 2023-01-12 DIAGNOSIS — R161 Splenomegaly, not elsewhere classified: Secondary | ICD-10-CM | POA: Insufficient documentation

## 2023-01-12 LAB — CMP (CANCER CENTER ONLY)
ALT: 13 U/L (ref 0–44)
AST: 19 U/L (ref 15–41)
Albumin: 4.8 g/dL (ref 3.5–5.0)
Alkaline Phosphatase: 69 U/L (ref 38–126)
Anion gap: 12 (ref 5–15)
BUN: 18 mg/dL (ref 8–23)
CO2: 37 mmol/L — ABNORMAL HIGH (ref 22–32)
Calcium: 10.6 mg/dL — ABNORMAL HIGH (ref 8.9–10.3)
Chloride: 93 mmol/L — ABNORMAL LOW (ref 98–111)
Creatinine: 0.71 mg/dL (ref 0.44–1.00)
GFR, Estimated: >60 mL/min (ref >60)
Glucose, Bld: 233 mg/dL — ABNORMAL HIGH (ref 70–99)
Potassium: 4 mmol/L (ref 3.5–5.1)
Sodium: 142 mmol/L (ref 135–145)
Total Bilirubin: 1.1 mg/dL (ref 0.3–1.2)
Total Protein: 8 g/dL (ref 6.5–8.1)

## 2023-01-12 LAB — CBC WITH DIFFERENTIAL (CANCER CENTER ONLY)
Abs Immature Granulocytes: 0.07 10*3/uL (ref 0.00–0.07)
Basophils Absolute: 0.1 10*3/uL (ref 0.0–0.1)
Basophils Relative: 1 %
Eosinophils Absolute: 0 10*3/uL (ref 0.0–0.5)
Eosinophils Relative: 0 %
HCT: 39.6 % (ref 36.0–46.0)
Hemoglobin: 13.2 g/dL (ref 12.0–15.0)
Immature Granulocytes: 1 %
Lymphocytes Relative: 19 %
Lymphs Abs: 1.6 10*3/uL (ref 0.7–4.0)
MCH: 28.4 pg (ref 26.0–34.0)
MCHC: 33.3 g/dL (ref 30.0–36.0)
MCV: 85.3 fL (ref 80.0–100.0)
Monocytes Absolute: 0.7 10*3/uL (ref 0.1–1.0)
Monocytes Relative: 8 %
Neutro Abs: 6.2 10*3/uL (ref 1.7–7.7)
Neutrophils Relative %: 71 %
Platelet Count: 66 10*3/uL — ABNORMAL LOW (ref 150–400)
RBC: 4.64 MIL/uL (ref 3.87–5.11)
RDW: 15.7 % — ABNORMAL HIGH (ref 11.5–15.5)
WBC Count: 8.6 10*3/uL (ref 4.0–10.5)
nRBC: 0 % (ref 0.0–0.2)

## 2023-01-12 LAB — IRON AND IRON BINDING CAPACITY (CC-WL,HP ONLY)
Iron: 83 ug/dL (ref 28–170)
Saturation Ratios: 17 % (ref 10.4–31.8)
TIBC: 476 ug/dL — ABNORMAL HIGH (ref 250–450)
UIBC: 393 ug/dL (ref 148–442)

## 2023-01-12 LAB — FERRITIN: Ferritin: 34 ng/mL (ref 11–307)

## 2023-01-12 LAB — LACTATE DEHYDROGENASE: LDH: 130 U/L (ref 98–192)

## 2023-01-12 MED ORDER — ROMIPLOSTIM 125 MCG ~~LOC~~ SOLR
1.0000 ug/kg | Freq: Once | SUBCUTANEOUS | Status: AC
  Administered 2023-01-12: 85 ug via SUBCUTANEOUS
  Filled 2023-01-12: qty 0.17

## 2023-01-12 NOTE — Progress Notes (Signed)
Hematology and Oncology Follow Up Visit  Rachael Buck [id number] [birth year] 82 y.o. 01/12/2023   Principle Diagnosis:  Thrombocytopenia -- NASH/Splenomegaly ; Immune based thrombocytopenia Iron deficiency    Current Therapy:        Nplate as indicated  IV iron as indicated   Interim History:  [name] is here today for follow-up. She is doing well and has no complaints at this time.  Platelets are 66, Hgb 13.2 and WBC count 8.6.  She has not noted any abnormal blood loss. No bruising or petechiae.  No fever, chills, n/v, cough, rash, dizziness, SOB, chest pain, palpitations, abdominal pain or changes in bowel or bladder habits.  She has abdominal bloating with NASH.  No swelling, tenderness, numbness or tingling in her extremities.  No falls or syncope.  Appetite and hydration are good. Weight is stable at 187 lbs.   ECOG Performance Status: 1 - Symptomatic but completely ambulatory  Medications:  Allergies as of 01/12/2023       Reactions   Codeine Nausea And Vomiting   Metformin And Related Diarrhea   Shrimp [shellfish Allergy] Swelling        Medication List        Accurate as of Jan 12, 2023  1:12 PM. If you have any questions, ask your nurse or doctor.          acetaminophen 500 MG tablet Commonly known as: TYLENOL Take 500 mg by mouth every 6 (six) hours as needed for mild pain.   albuterol 108 (90 Base) MCG/ACT inhaler Commonly known as: ProAir HFA Inhale 2 puffs into the lungs every 4 (four) hours as needed for wheezing or shortness of breath.   ALPRAZolam 0.5 MG tablet Commonly known as: XANAX TAKE 1 TABLET(0.5 MG) BY MOUTH THREE TIMES DAILY   blood glucose meter kit and supplies Kit Dispense based on patient and insurance preference. Use up to four times daily as directed.   colestipol 1 g tablet Commonly known as: COLESTID 1 g daily.   dapagliflozin propanediol 5 MG Tabs tablet Commonly known as: Farxiga Take 1 tablet (5 mg total)  by mouth daily before breakfast.   glipiZIDE 5 MG 24 hr tablet Commonly known as: GLUCOTROL XL TAKE 1 TABLET(5 MG) BY MOUTH DAILY WITH BREAKFAST   hydrochlorothiazide 25 MG tablet Commonly known as: HYDRODIURIL TAKE 1 TABLET(25 MG) BY MOUTH DAILY   hydrocortisone 2.5 % cream Apply 1 application topically daily as needed (itchy dry skin).   ipratropium-albuterol 0.5-2.5 (3) MG/3ML Soln Commonly known as: DUONEB Use one 3 ml neb treatment as needed every 6-8 hours prn   loperamide 2 MG capsule Commonly known as: IMODIUM Take 4-6 mg by mouth daily as needed (for IBS symptoms).   losartan 50 MG tablet Commonly known as: COZAAR Take 1 tablet (50 mg total) by mouth daily.   Melatonin 10 MG Tabs Take 10 mg by mouth at bedtime.   OneTouch Delica Lancets Fine Misc 1 each by Other route daily.   OneTouch Ultra test strip Generic drug: glucose blood TEST BLOOD SUGAR(S) ONCE DAILY IN THE MORNING   potassium chloride 10 MEQ tablet Commonly known as: KLOR-CON M Take 1 tablet (10 mEq total) by mouth daily.   sertraline 100 MG tablet Commonly known as: ZOLOFT Take 1.5 tablets (150 mg total) by mouth daily.   simvastatin 80 MG tablet Commonly known as: ZOCOR Take 1 tablet (80 mg total) by mouth daily.   Stiolto Respimat 2.5-2.5 MCG/ACT Aers Generic drug:  Tiotropium Bromide-Olodaterol Inhale 2 puffs into the lungs daily.   sucralfate 1 g tablet Commonly known as: CARAFATE Take 1 g by mouth 4 (four) times daily as needed.   traMADol 50 MG tablet Commonly known as: ULTRAM TAKE 1 TABLET(50 MG) BY MOUTH EVERY 6 HOURS AS NEEDED        Allergies:  Allergies  Allergen Reactions   Codeine Nausea And Vomiting   Metformin And Related Diarrhea   Shrimp [Shellfish Allergy] Swelling    Past Medical History, Surgical history, Social history, and Family History were reviewed and updated.  Review of Systems: All other 10 point review of systems is negative.   Physical  Exam:  weight is 187 lb 1.3 oz (84.9 kg). Her oral temperature is 98.7 F (37.1 C). Her blood pressure is 117/55 (abnormal) and her pulse is 86. Her respiration is 18 and oxygen saturation is 95%.   Wt Readings from Last 3 Encounters:  01/12/23 187 lb 1.3 oz (84.9 kg)  01/04/23 184 lb 3.2 oz (83.6 kg)  12/20/22 189 lb 9.6 oz (86 kg)    Ocular: Sclerae unicteric, pupils equal, round and reactive to light Ear-nose-throat: Oropharynx clear, dentition fair Lymphatic: No cervical or supraclavicular adenopathy Lungs no rales or rhonchi, good excursion bilaterally Heart regular rate and rhythm, no murmur appreciated Abd soft, nontender, positive bowel sounds MSK no focal spinal tenderness, no joint edema Neuro: non-focal, well-oriented, appropriate affect Breasts: Deferred   Lab Results  Component Value Date   WBC 8.6 01/12/2023   HGB 13.2 01/12/2023   HCT 39.6 01/12/2023   MCV 85.3 01/12/2023   PLT 66 (L) 01/12/2023   Lab Results  Component Value Date   FERRITIN 22 12/14/2022   IRON 105 12/14/2022   TIBC 437 12/14/2022   UIBC 332 12/14/2022   IRONPCTSAT 24 12/14/2022   Lab Results  Component Value Date   RETICCTPCT 2.3 12/14/2022   RBC 4.64 01/12/2023   No results found for: "KPAFRELGTCHN", "LAMBDASER", "KAPLAMBRATIO" No results found for: "IGGSERUM", "IGA", "IGMSERUM" No results found for: "TOTALPROTELP", "ALBUMINELP", "A1GS", "A2GS", "BETS", "BETA2SER", "GAMS", "MSPIKE", "SPEI"   Chemistry      Component Value Date/Time   NA 140 12/14/2022 1103   NA 127 (L) 02/14/2017 1335   K 4.1 12/14/2022 1103   K 3.6 02/14/2017 1335   CL 102 12/14/2022 1103   CL 92 (L) 02/14/2017 1335   CO2 30 12/14/2022 1103   CO2 28 02/14/2017 1335   BUN 14 12/14/2022 1103   BUN 7 (L) 02/14/2017 1335   CREATININE 0.70 12/14/2022 1103   CREATININE 0.47 (L) 02/14/2017 1335   CREATININE 0.56 07/24/2014 1204      Component Value Date/Time   CALCIUM 9.1 12/14/2022 1103   CALCIUM 9.3  02/14/2017 1335   ALKPHOS 57 12/14/2022 1103   ALKPHOS 68 02/14/2017 1335   AST 16 12/14/2022 1103   ALT 8 12/14/2022 1103   BILITOT 1.1 12/14/2022 1103       Impression and Plan: 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  Nplate given today for platelets count of 66.  Lab check monthly with injection and follow-up in 2 months.   Eileen Stanford, NP 5/15/20241:12 PM

## 2023-01-12 NOTE — Patient Instructions (Signed)
Romiplostim Injection What is this medication? ROMIPLOSTIM ([place] mi PLOE stim) treats low levels of platelets in your body caused by immune thrombocytopenia (ITP). It is prescribed when other medications have not worked or cannot be tolerated. It may also be used to help people who have been exposed to high doses of radiation. It works by increasing the amount of platelets in your blood. This lowers the risk of bleeding. This medicine may be used for other purposes; ask your health care provider or pharmacist if you have questions. COMMON BRAND NAME(S): Nplate What should I tell my care team before I take this medication? They need to know if you have any of these conditions: Blood clots Myelodysplastic syndrome An unusual or allergic reaction to romiplostim, mannitol, other medications, foods, dyes, or preservatives Pregnant or trying to get pregnant Breast-feeding How should I use this medication? This medication is injected under the skin. It is given by a care team in a hospital or clinic setting. A special MedGuide will be given to you before each treatment. Be sure to read this information carefully each time. Talk to your care team about the use of this medication in children. While it may be prescribed for children as young as newborns for selected conditions, precautions do apply. Overdosage: If you think you have taken too much of this medicine contact a poison control center or emergency room at once. NOTE: This medicine is only for you. Do not share this medicine with others. What if I miss a dose? Keep appointments for follow-up doses. It is important not to miss your dose. Call your care team if you are unable to keep an appointment. What may interact with this medication? Interactions are not expected. This list may not describe all possible interactions. Give your health care provider a list of all the medicines, herbs, non-prescription drugs, or dietary supplements you use. Also  tell them if you smoke, drink alcohol, or use illegal drugs. Some items may interact with your medicine. What should I watch for while using this medication? Visit your care team for regular checks on your progress. You may need blood work done while you are taking this medication. Your condition will be monitored carefully while you are receiving this medication. It is important not to miss any appointments. What side effects may I notice from receiving this medication? Side effects that you should report to your care team as soon as possible: Allergic reactions--skin rash, itching, hives, swelling of the face, lips, tongue, or throat Blood clot--pain, swelling, or warmth in the leg, shortness of breath, chest pain Side effects that usually do not require medical attention (report to your care team if they continue or are bothersome): Dizziness Joint pain Muscle pain Pain in the hands or feet Stomach pain Trouble sleeping This list may not describe all possible side effects. Call your doctor for medical advice about side effects. You may report side effects to FDA at 1-800-FDA-1088. Where should I keep my medication? This medication is given in a hospital or clinic. It will not be stored at home. NOTE: This sheet is a summary. It may not cover all possible information. If you have questions about this medicine, talk to your doctor, pharmacist, or health care provider.  2023 Elsevier/Gold Standard (2021-11-24 00:00:00)  

## 2023-02-07 ENCOUNTER — Other Ambulatory Visit: Payer: Self-pay | Admitting: Hematology & Oncology

## 2023-02-07 ENCOUNTER — Other Ambulatory Visit: Payer: Self-pay | Admitting: Family Medicine

## 2023-02-07 DIAGNOSIS — M25559 Pain in unspecified hip: Secondary | ICD-10-CM

## 2023-02-07 DIAGNOSIS — M545 Low back pain, unspecified: Secondary | ICD-10-CM

## 2023-02-09 ENCOUNTER — Inpatient Hospital Stay: Payer: PPO | Attending: Family

## 2023-02-09 ENCOUNTER — Encounter (INDEPENDENT_AMBULATORY_CARE_PROVIDER_SITE_OTHER): Payer: PPO | Admitting: Family Medicine

## 2023-02-09 ENCOUNTER — Inpatient Hospital Stay: Payer: PPO

## 2023-02-09 VITALS — BP 141/74 | HR 95 | Temp 98.1°F | Resp 18

## 2023-02-09 DIAGNOSIS — K7581 Nonalcoholic steatohepatitis (NASH): Secondary | ICD-10-CM

## 2023-02-09 DIAGNOSIS — K580 Irritable bowel syndrome with diarrhea: Secondary | ICD-10-CM

## 2023-02-09 DIAGNOSIS — D696 Thrombocytopenia, unspecified: Secondary | ICD-10-CM

## 2023-02-09 DIAGNOSIS — D500 Iron deficiency anemia secondary to blood loss (chronic): Secondary | ICD-10-CM

## 2023-02-09 DIAGNOSIS — Z79899 Other long term (current) drug therapy: Secondary | ICD-10-CM | POA: Diagnosis not present

## 2023-02-09 DIAGNOSIS — D693 Immune thrombocytopenic purpura: Secondary | ICD-10-CM | POA: Diagnosis not present

## 2023-02-09 LAB — CBC WITH DIFFERENTIAL (CANCER CENTER ONLY)
Abs Immature Granulocytes: 0.06 10*3/uL (ref 0.00–0.07)
Basophils Absolute: 0.1 10*3/uL (ref 0.0–0.1)
Basophils Relative: 1 %
Eosinophils Absolute: 0 10*3/uL (ref 0.0–0.5)
Eosinophils Relative: 1 %
HCT: 37.9 % (ref 36.0–46.0)
Hemoglobin: 12.9 g/dL (ref 12.0–15.0)
Immature Granulocytes: 1 %
Lymphocytes Relative: 20 %
Lymphs Abs: 1.7 10*3/uL (ref 0.7–4.0)
MCH: 28.5 pg (ref 26.0–34.0)
MCHC: 34 g/dL (ref 30.0–36.0)
MCV: 83.8 fL (ref 80.0–100.0)
Monocytes Absolute: 0.8 10*3/uL (ref 0.1–1.0)
Monocytes Relative: 9 %
Neutro Abs: 5.9 10*3/uL (ref 1.7–7.7)
Neutrophils Relative %: 68 %
Platelet Count: 73 10*3/uL — ABNORMAL LOW (ref 150–400)
RBC: 4.52 MIL/uL (ref 3.87–5.11)
RDW: 16.4 % — ABNORMAL HIGH (ref 11.5–15.5)
WBC Count: 8.5 10*3/uL (ref 4.0–10.5)
nRBC: 0 % (ref 0.0–0.2)

## 2023-02-09 LAB — CMP (CANCER CENTER ONLY)
ALT: 13 U/L (ref 0–44)
AST: 22 U/L (ref 15–41)
Albumin: 4.5 g/dL (ref 3.5–5.0)
Alkaline Phosphatase: 56 U/L (ref 38–126)
Anion gap: 10 (ref 5–15)
BUN: 18 mg/dL (ref 8–23)
CO2: 32 mmol/L (ref 22–32)
Calcium: 10 mg/dL (ref 8.9–10.3)
Chloride: 95 mmol/L — ABNORMAL LOW (ref 98–111)
Creatinine: 0.76 mg/dL (ref 0.44–1.00)
GFR, Estimated: >60 mL/min (ref >60)
Glucose, Bld: 179 mg/dL — ABNORMAL HIGH (ref 70–99)
Potassium: 3.2 mmol/L — ABNORMAL LOW (ref 3.5–5.1)
Sodium: 137 mmol/L (ref 135–145)
Total Bilirubin: 1.1 mg/dL (ref 0.3–1.2)
Total Protein: 7.7 g/dL (ref 6.5–8.1)

## 2023-02-09 MED ORDER — ROMIPLOSTIM 125 MCG ~~LOC~~ SOLR
85.0000 ug | SUBCUTANEOUS | Status: DC
  Administered 2023-02-09: 85 ug via SUBCUTANEOUS
  Filled 2023-02-09: qty 0.17

## 2023-02-09 NOTE — Progress Notes (Signed)
Per Dr. Myna Hidalgo patient is to get Nplate for PLT<80.

## 2023-02-09 NOTE — Patient Instructions (Signed)
Romiplostim Injection What is this medication? ROMIPLOSTIM (roe mi PLOE stim) treats low levels of platelets in your body caused by immune thrombocytopenia (ITP). It is prescribed when other medications have not worked or cannot be tolerated. It may also be used to help people who have been exposed to high doses of radiation. It works by increasing the amount of platelets in your blood. This lowers the risk of bleeding. This medicine may be used for other purposes; ask your health care provider or pharmacist if you have questions. COMMON BRAND NAME(S): Nplate What should I tell my care team before I take this medication? They need to know if you have any of these conditions: Blood clots Myelodysplastic syndrome An unusual or allergic reaction to romiplostim, mannitol, other medications, foods, dyes, or preservatives Pregnant or trying to get pregnant Breast-feeding How should I use this medication? This medication is injected under the skin. It is given by a care team in a hospital or clinic setting. A special MedGuide will be given to you before each treatment. Be sure to read this information carefully each time. Talk to your care team about the use of this medication in children. While it may be prescribed for children as young as newborns for selected conditions, precautions do apply. Overdosage: If you think you have taken too much of this medicine contact a poison control center or emergency room at once. NOTE: This medicine is only for you. Do not share this medicine with others. What if I miss a dose? Keep appointments for follow-up doses. It is important not to miss your dose. Call your care team if you are unable to keep an appointment. What may interact with this medication? Interactions are not expected. This list may not describe all possible interactions. Give your health care provider a list of all the medicines, herbs, non-prescription drugs, or dietary supplements you use. Also  tell them if you smoke, drink alcohol, or use illegal drugs. Some items may interact with your medicine. What should I watch for while using this medication? Visit your care team for regular checks on your progress. You may need blood work done while you are taking this medication. Your condition will be monitored carefully while you are receiving this medication. It is important not to miss any appointments. What side effects may I notice from receiving this medication? Side effects that you should report to your care team as soon as possible: Allergic reactions--skin rash, itching, hives, swelling of the face, lips, tongue, or throat Blood clot--pain, swelling, or warmth in the leg, shortness of breath, chest pain Side effects that usually do not require medical attention (report to your care team if they continue or are bothersome): Dizziness Joint pain Muscle pain Pain in the hands or feet Stomach pain Trouble sleeping This list may not describe all possible side effects. Call your doctor for medical advice about side effects. You may report side effects to FDA at 1-800-FDA-1088. Where should I keep my medication? This medication is given in a hospital or clinic. It will not be stored at home. NOTE: This sheet is a summary. It may not cover all possible information. If you have questions about this medicine, talk to your doctor, pharmacist, or health care provider.  2024 Elsevier/Gold Standard (2021-12-21 00:00:00)  

## 2023-02-10 MED ORDER — RIFAXIMIN 550 MG PO TABS
550.0000 mg | ORAL_TABLET | Freq: Three times a day (TID) | ORAL | 0 refills | Status: DC
Start: 2023-02-10 — End: 2023-10-06

## 2023-02-10 NOTE — Addendum Note (Signed)
Addended by: Abbe Amsterdam C on: 02/10/2023 10:08 AM   Modules accepted: Orders

## 2023-02-10 NOTE — Telephone Encounter (Signed)
Please see the MyChart message reply(ies) for my assessment and plan.  The patient gave consent for this Medical Advice Message and is aware that it may result in a bill to their insurance company as well as the possibility that this may result in a co-payment or deductible. They are an established patient, but are not seeking medical advice exclusively about a problem treated during an in person or video visit in the last 7 days. I did not recommend an in person or video visit within 7 days of my reply.  I spent a total of 15 minutes cumulative time within 7 days through MyChart messaging Nakoma Gotwalt, MD  

## 2023-02-11 ENCOUNTER — Telehealth: Payer: Self-pay

## 2023-02-11 NOTE — Telephone Encounter (Signed)
PA approved.   14-JUN-24:28-JUN-24 Xifaxan 550MG  OR TABS Quantity:42;

## 2023-02-11 NOTE — Telephone Encounter (Signed)
PA initiated via Covermymeds; KEY: [id number]. Awaiting determination.

## 2023-03-09 ENCOUNTER — Ambulatory Visit (INDEPENDENT_AMBULATORY_CARE_PROVIDER_SITE_OTHER): Payer: PPO | Admitting: Family Medicine

## 2023-03-09 ENCOUNTER — Encounter: Payer: Self-pay | Admitting: Family Medicine

## 2023-03-09 VITALS — BP 138/74 | HR 90 | Ht 66.0 in | Wt 185.0 lb

## 2023-03-09 DIAGNOSIS — R197 Diarrhea, unspecified: Secondary | ICD-10-CM

## 2023-03-09 DIAGNOSIS — F32A Depression, unspecified: Secondary | ICD-10-CM | POA: Diagnosis not present

## 2023-03-09 MED ORDER — SERTRALINE HCL 100 MG PO TABS
150.0000 mg | ORAL_TABLET | Freq: Every day | ORAL | 2 refills | Status: DC
Start: 2023-03-09 — End: 2023-12-13

## 2023-03-09 NOTE — Progress Notes (Signed)
Acute Office Visit  Subjective:     Patient ID: Rachael Buck, female    DOB: Jan 19, 1941, 82 y.o.   MRN: [id number]  Chief Complaint  Patient presents with   Diarrhea    HPI Patient is in today for diarrhea.   Discussed the use of AI scribe software for clinical note transcription with the patient, who gave verbal consent to proceed.  History of Present Illness   The patient, with a history of irritable bowel syndrome (IBS), non-alcoholic cirrhosis of the liver, and a spleen problem, presents with a three-week history of diarrhea. The frequency of bowel movements has varied, with up to six episodes in a day. The patient has been managing the symptoms with over-the-counter medications like Imodium and Pepto Bismol. The diarrhea is often triggered by food intake, with episodes occurring immediately after eating. The patient also reports occasional black stools, but denies any current presence of blood in the stool. States she did have occult stool several months ago and has since followed up with GI.  The patient has been experiencing abdominal discomfort, describing it as pressure, particularly in the upper  abdomen. The discomfort is more pronounced when lying on the side, necessitating the use of a pillow for support. The patient also reports occasional nausea and frequent headaches, which occur every two to three days.  The patient has been under significant stress following the recent passing of their spouse, which may have exacerbated their IBS symptoms. They are currently on Sertraline for anxiety.            ROS All review of systems negative except what is listed in the HPI      Objective:    BP 138/74   Pulse 90   Ht 5\' 6"  (1.676 m)   Wt 185 lb (83.9 kg)   SpO2 95%   BMI 29.86 kg/m    Physical Exam Vitals reviewed.  Constitutional:      Appearance: Normal appearance.  Cardiovascular:     Rate and Rhythm: Normal rate and regular rhythm.     Pulses:  Normal pulses.     Heart sounds: Normal heart sounds.  Pulmonary:     Effort: Pulmonary effort is normal.     Breath sounds: Normal breath sounds.  Abdominal:     Tenderness: There is no right CVA tenderness, left CVA tenderness, guarding or rebound.     Comments: Mild epigastric fullness noted with some slight tenderness on palpation  Skin:    General: Skin is warm and dry.  Neurological:     Mental Status: She is alert and oriented to person, place, and time.  Psychiatric:        Mood and Affect: Mood normal.        Behavior: Behavior normal.        Thought Content: Thought content normal.        Judgment: Judgment normal.     No results found for any visits on 03/09/23.      Assessment & Plan:   Problem List Items Addressed This Visit     Depression No SI/HI Refill requested.    Relevant Medications   sertraline (ZOLOFT) 100 MG tablet   Other Visit Diagnoses     Diarrhea, unspecified type    -  Primary Blood work and stool cultures ordered given duration of symptoms.  States this is not typical for her IBS flare to last this long. She does have some generalized abdominal discomfort - offered imaging,  but states she is seeing Dr. Myna Hidalgo next week and he will occasionally do imaging to monitor her spleen so she would like to wait until she sees him. Discussed supportive measures in the meantime. Patient aware of signs/symptoms requiring further/urgent evaluation.      Relevant Orders   CBC with Differential/Platelet   Comprehensive metabolic panel   Clostridium Difficile by PCR   Stool culture       Meds ordered this encounter  Medications   sertraline (ZOLOFT) 100 MG tablet    Sig: Take 1.5 tablets (150 mg total) by mouth daily.    Dispense:  135 tablet    Refill:  2    Order Specific Question:   Supervising Provider    Answer:   Danise Edge A [[id number]]    Return if symptoms worsen or fail to improve.  Clayborne Dana, NP

## 2023-03-10 LAB — COMPREHENSIVE METABOLIC PANEL
ALT: 12 U/L (ref 0–35)
AST: 24 U/L (ref 0–37)
Albumin: 4.5 g/dL (ref 3.5–5.2)
Alkaline Phosphatase: 53 U/L (ref 39–117)
BUN: 12 mg/dL (ref 6–23)
CO2: 32 mEq/L (ref 19–32)
Calcium: 9.7 mg/dL (ref 8.4–10.5)
Chloride: 95 mEq/L — ABNORMAL LOW (ref 96–112)
Creatinine, Ser: 0.7 mg/dL (ref 0.40–1.20)
GFR: 81.5 mL/min (ref >60.00)
Glucose, Bld: 134 mg/dL — ABNORMAL HIGH (ref 70–99)
Potassium: 3.1 mEq/L — ABNORMAL LOW (ref 3.5–5.1)
Sodium: 137 mEq/L (ref 135–145)
Total Bilirubin: 1.3 mg/dL — ABNORMAL HIGH (ref 0.2–1.2)
Total Protein: 7.3 g/dL (ref 6.0–8.3)

## 2023-03-10 LAB — CBC WITH DIFFERENTIAL/PLATELET
Basophils Absolute: 0.1 10*3/uL (ref 0.0–0.1)
Basophils Relative: 0.8 % (ref 0.0–3.0)
Eosinophils Absolute: 0 10*3/uL (ref 0.0–0.7)
Eosinophils Relative: 0.7 % (ref 0.0–5.0)
HCT: 38.2 % (ref 36.0–46.0)
Hemoglobin: 13 g/dL (ref 12.0–15.0)
Lymphocytes Relative: 19.4 % (ref 12.0–46.0)
Lymphs Abs: 1.3 10*3/uL (ref 0.7–4.0)
MCHC: 34 g/dL (ref 30.0–36.0)
MCV: 84.6 fl (ref 78.0–100.0)
Monocytes Absolute: 0.6 10*3/uL (ref 0.1–1.0)
Monocytes Relative: 9.1 % (ref 3.0–12.0)
Neutro Abs: 4.5 10*3/uL (ref 1.4–7.7)
Neutrophils Relative %: 70 % (ref 43.0–77.0)
Platelets: 65 10*3/uL — ABNORMAL LOW (ref 150.0–400.0)
RBC: 4.52 Mil/uL (ref 3.87–5.11)
RDW: 17.3 % — ABNORMAL HIGH (ref 11.5–15.5)
WBC: 6.5 10*3/uL (ref 4.0–10.5)

## 2023-03-16 ENCOUNTER — Inpatient Hospital Stay: Payer: PPO | Attending: Family | Admitting: Family

## 2023-03-16 ENCOUNTER — Encounter: Payer: Self-pay | Admitting: Family

## 2023-03-16 ENCOUNTER — Inpatient Hospital Stay: Payer: PPO

## 2023-03-16 ENCOUNTER — Inpatient Hospital Stay: Payer: PPO | Admitting: Family

## 2023-03-16 VITALS — BP 107/69 | HR 92 | Temp 98.2°F | Resp 18 | Wt 186.0 lb

## 2023-03-16 DIAGNOSIS — Z7984 Long term (current) use of oral hypoglycemic drugs: Secondary | ICD-10-CM | POA: Insufficient documentation

## 2023-03-16 DIAGNOSIS — D696 Thrombocytopenia, unspecified: Secondary | ICD-10-CM | POA: Diagnosis not present

## 2023-03-16 DIAGNOSIS — R197 Diarrhea, unspecified: Secondary | ICD-10-CM | POA: Diagnosis not present

## 2023-03-16 DIAGNOSIS — R5383 Other fatigue: Secondary | ICD-10-CM | POA: Insufficient documentation

## 2023-03-16 DIAGNOSIS — D500 Iron deficiency anemia secondary to blood loss (chronic): Secondary | ICD-10-CM

## 2023-03-16 DIAGNOSIS — D509 Iron deficiency anemia, unspecified: Secondary | ICD-10-CM | POA: Insufficient documentation

## 2023-03-16 DIAGNOSIS — K7581 Nonalcoholic steatohepatitis (NASH): Secondary | ICD-10-CM

## 2023-03-16 DIAGNOSIS — R161 Splenomegaly, not elsewhere classified: Secondary | ICD-10-CM | POA: Insufficient documentation

## 2023-03-16 DIAGNOSIS — D693 Immune thrombocytopenic purpura: Secondary | ICD-10-CM | POA: Insufficient documentation

## 2023-03-16 LAB — IRON AND IRON BINDING CAPACITY (CC-WL,HP ONLY)
Iron: 101 ug/dL (ref 28–170)
Saturation Ratios: 23 % (ref 10.4–31.8)
TIBC: 445 ug/dL (ref 250–450)
UIBC: 344 ug/dL (ref 148–442)

## 2023-03-16 LAB — CMP (CANCER CENTER ONLY)
ALT: 9 U/L (ref 0–44)
AST: 20 U/L (ref 15–41)
Albumin: 4.7 g/dL (ref 3.5–5.0)
Alkaline Phosphatase: 48 U/L (ref 38–126)
Anion gap: 10 (ref 5–15)
BUN: 17 mg/dL (ref 8–23)
CO2: 32 mmol/L (ref 22–32)
Calcium: 10.1 mg/dL (ref 8.9–10.3)
Chloride: 97 mmol/L — ABNORMAL LOW (ref 98–111)
Creatinine: 0.75 mg/dL (ref 0.44–1.00)
GFR, Estimated: >60 mL/min (ref >60)
Glucose, Bld: 151 mg/dL — ABNORMAL HIGH (ref 70–99)
Potassium: 3.3 mmol/L — ABNORMAL LOW (ref 3.5–5.1)
Sodium: 139 mmol/L (ref 135–145)
Total Bilirubin: 1.2 mg/dL (ref 0.3–1.2)
Total Protein: 7.5 g/dL (ref 6.5–8.1)

## 2023-03-16 LAB — CBC WITH DIFFERENTIAL (CANCER CENTER ONLY)
Abs Immature Granulocytes: 0.02 10*3/uL (ref 0.00–0.07)
Basophils Absolute: 0 10*3/uL (ref 0.0–0.1)
Basophils Relative: 1 %
Eosinophils Absolute: 0 10*3/uL (ref 0.0–0.5)
Eosinophils Relative: 0 %
HCT: 37.4 % (ref 36.0–46.0)
Hemoglobin: 12.6 g/dL (ref 12.0–15.0)
Immature Granulocytes: 0 %
Lymphocytes Relative: 22 %
Lymphs Abs: 1.4 10*3/uL (ref 0.7–4.0)
MCH: 28.6 pg (ref 26.0–34.0)
MCHC: 33.7 g/dL (ref 30.0–36.0)
MCV: 84.8 fL (ref 80.0–100.0)
Monocytes Absolute: 0.6 10*3/uL (ref 0.1–1.0)
Monocytes Relative: 10 %
Neutro Abs: 4.5 10*3/uL (ref 1.7–7.7)
Neutrophils Relative %: 67 %
Platelet Count: 55 10*3/uL — ABNORMAL LOW (ref 150–400)
RBC: 4.41 MIL/uL (ref 3.87–5.11)
RDW: 16.1 % — ABNORMAL HIGH (ref 11.5–15.5)
WBC Count: 6.7 10*3/uL (ref 4.0–10.5)
nRBC: 0 % (ref 0.0–0.2)

## 2023-03-16 LAB — FERRITIN: Ferritin: 29 ng/mL (ref 11–307)

## 2023-03-16 LAB — LACTATE DEHYDROGENASE: LDH: 132 U/L (ref 98–192)

## 2023-03-16 MED ORDER — ROMIPLOSTIM 125 MCG ~~LOC~~ SOLR
1.0000 ug/kg | SUBCUTANEOUS | Status: DC
  Administered 2023-03-16: 85 ug via SUBCUTANEOUS
  Filled 2023-03-16: qty 0.17

## 2023-03-16 NOTE — Patient Instructions (Signed)
Romiplostim Injection What is this medication? ROMIPLOSTIM (roe mi PLOE stim) treats low levels of platelets in your body caused by immune thrombocytopenia (ITP). It is prescribed when other medications have not worked or cannot be tolerated. It may also be used to help people who have been exposed to high doses of radiation. It works by increasing the amount of platelets in your blood. This lowers the risk of bleeding. This medicine may be used for other purposes; ask your health care provider or pharmacist if you have questions. COMMON BRAND NAME(S): Nplate What should I tell my care team before I take this medication? They need to know if you have any of these conditions: Blood clots Myelodysplastic syndrome An unusual or allergic reaction to romiplostim, mannitol, other medications, foods, dyes, or preservatives Pregnant or trying to get pregnant Breast-feeding How should I use this medication? This medication is injected under the skin. It is given by a care team in a hospital or clinic setting. A special MedGuide will be given to you before each treatment. Be sure to read this information carefully each time. Talk to your care team about the use of this medication in children. While it may be prescribed for children as young as newborns for selected conditions, precautions do apply. Overdosage: If you think you have taken too much of this medicine contact a poison control center or emergency room at once. NOTE: This medicine is only for you. Do not share this medicine with others. What if I miss a dose? Keep appointments for follow-up doses. It is important not to miss your dose. Call your care team if you are unable to keep an appointment. What may interact with this medication? Interactions are not expected. This list may not describe all possible interactions. Give your health care provider a list of all the medicines, herbs, non-prescription drugs, or dietary supplements you use. Also  tell them if you smoke, drink alcohol, or use illegal drugs. Some items may interact with your medicine. What should I watch for while using this medication? Visit your care team for regular checks on your progress. You may need blood work done while you are taking this medication. Your condition will be monitored carefully while you are receiving this medication. It is important not to miss any appointments. What side effects may I notice from receiving this medication? Side effects that you should report to your care team as soon as possible: Allergic reactions--skin rash, itching, hives, swelling of the face, lips, tongue, or throat Blood clot--pain, swelling, or warmth in the leg, shortness of breath, chest pain Side effects that usually do not require medical attention (report to your care team if they continue or are bothersome): Dizziness Joint pain Muscle pain Pain in the hands or feet Stomach pain Trouble sleeping This list may not describe all possible side effects. Call your doctor for medical advice about side effects. You may report side effects to FDA at 1-800-FDA-1088. Where should I keep my medication? This medication is given in a hospital or clinic. It will not be stored at home. NOTE: This sheet is a summary. It may not cover all possible information. If you have questions about this medicine, talk to your doctor, pharmacist, or health care provider.  2024 Elsevier/Gold Standard (2021-12-21 00:00:00)  

## 2023-03-16 NOTE — Progress Notes (Signed)
Hematology and Oncology Follow Up Visit  Rachael Buck [id number] Apr 25, 1941 82 y.o. 03/16/2023   Principle Diagnosis:  Thrombocytopenia -- NASH/Splenomegaly ; Immune based thrombocytopenia Iron deficiency    Current Therapy:        Nplate as indicated  IV iron as indicated   Interim History:  Rachael Buck is here today for follow-up. She is doing fairly well. She notes chornic fatigued to be unchanged.  Her stool has been dark and she states that she has been having watery diarrhea for several weeks now. She has reached out to GI for follow-up and treatment.  No other blood loss noted. No bruising or petechiae.  No fever, chills, n/v, cough, rash, chest pain, palpitations, abdominal pain or changes in bowel or bladder habits.  No swelling in her extremities.  No falls or syncope.  She has had some episodes of dizziness and mild SOB with over exertion.  Appetite and hydration are good. Weight is stable at 186 lbs.   ECOG Performance Status: 1 - Symptomatic but completely ambulatory  Medications:  Allergies as of 03/16/2023       Reactions   Codeine Nausea And Vomiting   Metformin And Related Diarrhea   Shrimp [shellfish Allergy] Swelling        Medication List        Accurate as of March 16, 2023 12:50 PM. If you have any questions, ask your nurse or doctor.          acetaminophen 500 MG tablet Commonly known as: TYLENOL Take 500 mg by mouth every 6 (six) hours as needed for mild pain.   albuterol 108 (90 Base) MCG/ACT inhaler Commonly known as: ProAir HFA Inhale 2 puffs into the lungs every 4 (four) hours as needed for wheezing or shortness of breath.   ALPRAZolam 0.5 MG tablet Commonly known as: XANAX TAKE 1 TABLET(0.5 MG) BY MOUTH THREE TIMES DAILY   blood glucose meter kit and supplies Kit Dispense based on patient and insurance preference. Use up to four times daily as directed.   colestipol 1 g tablet Commonly known as: COLESTID 1 g daily.    dapagliflozin propanediol 5 MG Tabs tablet Commonly known as: Farxiga Take 1 tablet (5 mg total) by mouth daily before breakfast.   glipiZIDE 5 MG 24 hr tablet Commonly known as: GLUCOTROL XL TAKE 1 TABLET(5 MG) BY MOUTH DAILY WITH BREAKFAST   hydrochlorothiazide 25 MG tablet Commonly known as: HYDRODIURIL TAKE 1 TABLET(25 MG) BY MOUTH DAILY   hydrocortisone 2.5 % cream Apply 1 application topically daily as needed (itchy dry skin).   ipratropium-albuterol 0.5-2.5 (3) MG/3ML Soln Commonly known as: DUONEB Use one 3 ml neb treatment as needed every 6-8 hours prn   loperamide 2 MG capsule Commonly known as: IMODIUM Take 4-6 mg by mouth daily as needed (for IBS symptoms).   losartan 50 MG tablet Commonly known as: COZAAR Take 1 tablet (50 mg total) by mouth daily.   Melatonin 10 MG Tabs Take 10 mg by mouth at bedtime.   OneTouch Delica Lancets Fine Misc 1 each by Other route daily.   OneTouch Ultra test strip Generic drug: glucose blood TEST BLOOD SUGAR(S) ONCE DAILY IN THE MORNING   potassium chloride 10 MEQ tablet Commonly known as: KLOR-CON M Take 1 tablet (10 mEq total) by mouth daily.   rifaximin 550 MG Tabs tablet Commonly known as: Xifaxan Take 1 tablet (550 mg total) by mouth 3 (three) times daily. Take for 2 weeks  sertraline 100 MG tablet Commonly known as: ZOLOFT Take 1.5 tablets (150 mg total) by mouth daily.   simvastatin 80 MG tablet Commonly known as: ZOCOR Take 1 tablet (80 mg total) by mouth daily.   Stiolto Respimat 2.5-2.5 MCG/ACT Aers Generic drug: Tiotropium Bromide-Olodaterol Inhale 2 puffs into the lungs daily.   sucralfate 1 g tablet Commonly known as: CARAFATE Take 1 g by mouth 4 (four) times daily as needed.   traMADol 50 MG tablet Commonly known as: ULTRAM TAKE 1 TABLET(50 MG) BY MOUTH EVERY 6 HOURS AS NEEDED        Allergies:  Allergies  Allergen Reactions   Codeine Nausea And Vomiting   Metformin And Related  Diarrhea   Shrimp [Shellfish Allergy] Swelling    Past Medical History, Surgical history, Social history, and Family History were reviewed and updated.  Review of Systems: All other 10 point review of systems is negative.   Physical Exam:  vitals were not taken for this visit.   Wt Readings from Last 3 Encounters:  03/09/23 185 lb (83.9 kg)  01/12/23 187 lb 1.3 oz (84.9 kg)  01/04/23 184 lb 3.2 oz (83.6 kg)    Ocular: Sclerae unicteric, pupils equal, round and reactive to light Ear-nose-throat: Oropharynx clear, dentition fair Lymphatic: No cervical or supraclavicular adenopathy Lungs no rales or rhonchi, good excursion bilaterally Heart regular rate and rhythm, no murmur appreciated Abd soft, nontender, positive bowel sounds MSK no focal spinal tenderness, no joint edema Neuro: non-focal, well-oriented, appropriate affect Breasts: Deferred   Lab Results  Component Value Date   WBC 6.5 03/09/2023   HGB 13.0 03/09/2023   HCT 38.2 03/09/2023   MCV 84.6 03/09/2023   PLT 65.0 (L) 03/09/2023   Lab Results  Component Value Date   FERRITIN 34 01/12/2023   IRON 83 01/12/2023   TIBC 476 (H) 01/12/2023   UIBC 393 01/12/2023   IRONPCTSAT 17 01/12/2023   Lab Results  Component Value Date   RETICCTPCT 2.3 12/14/2022   RBC 4.52 03/09/2023   No results found for: "KPAFRELGTCHN", "LAMBDASER", "KAPLAMBRATIO" No results found for: "IGGSERUM", "IGA", "IGMSERUM" No results found for: "TOTALPROTELP", "ALBUMINELP", "A1GS", "A2GS", "BETS", "BETA2SER", "GAMS", "MSPIKE", "SPEI"   Chemistry      Component Value Date/Time   NA 137 03/09/2023 1428   NA 127 (L) 02/14/2017 1335   K 3.1 (L) 03/09/2023 1428   K 3.6 02/14/2017 1335   CL 95 (L) 03/09/2023 1428   CL 92 (L) 02/14/2017 1335   CO2 32 03/09/2023 1428   CO2 28 02/14/2017 1335   BUN 12 03/09/2023 1428   BUN 7 (L) 02/14/2017 1335   CREATININE 0.70 03/09/2023 1428   CREATININE 0.76 02/09/2023 1258   CREATININE 0.47 (L)  02/14/2017 1335   CREATININE 0.56 07/24/2014 1204      Component Value Date/Time   CALCIUM 9.7 03/09/2023 1428   CALCIUM 9.3 02/14/2017 1335   ALKPHOS 53 03/09/2023 1428   ALKPHOS 68 02/14/2017 1335   AST 24 03/09/2023 1428   AST 22 02/09/2023 1258   ALT 12 03/09/2023 1428   ALT 13 02/09/2023 1258   BILITOT 1.3 (H) 03/09/2023 1428   BILITOT 1.1 02/09/2023 1258       Impression and Plan: Ms. 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  Nplate given today for platelets count of 55.  Iron studies are pending.  Lab check monthly with injection and follow-up in 2 months.  Eileen Stanford, NP 7/17/202412:50 PM

## 2023-03-22 ENCOUNTER — Other Ambulatory Visit: Payer: Self-pay | Admitting: Hematology & Oncology

## 2023-03-22 DIAGNOSIS — M25559 Pain in unspecified hip: Secondary | ICD-10-CM

## 2023-03-22 DIAGNOSIS — M545 Low back pain, unspecified: Secondary | ICD-10-CM

## 2023-04-14 ENCOUNTER — Inpatient Hospital Stay: Payer: PPO | Attending: Family

## 2023-04-14 ENCOUNTER — Inpatient Hospital Stay: Payer: PPO

## 2023-04-14 VITALS — BP 129/56 | HR 80 | Temp 98.0°F | Resp 20

## 2023-04-14 DIAGNOSIS — D696 Thrombocytopenia, unspecified: Secondary | ICD-10-CM

## 2023-04-14 DIAGNOSIS — D693 Immune thrombocytopenic purpura: Secondary | ICD-10-CM | POA: Diagnosis not present

## 2023-04-14 DIAGNOSIS — D500 Iron deficiency anemia secondary to blood loss (chronic): Secondary | ICD-10-CM

## 2023-04-14 LAB — CMP (CANCER CENTER ONLY)
ALT: 10 U/L (ref 0–44)
AST: 20 U/L (ref 15–41)
Albumin: 4.6 g/dL (ref 3.5–5.0)
Alkaline Phosphatase: 51 U/L (ref 38–126)
Anion gap: 8 (ref 5–15)
BUN: 18 mg/dL (ref 8–23)
CO2: 35 mmol/L — ABNORMAL HIGH (ref 22–32)
Calcium: 9.5 mg/dL (ref 8.9–10.3)
Chloride: 95 mmol/L — ABNORMAL LOW (ref 98–111)
Creatinine: 0.89 mg/dL (ref 0.44–1.00)
GFR, Estimated: >60 mL/min (ref >60)
Glucose, Bld: 182 mg/dL — ABNORMAL HIGH (ref 70–99)
Potassium: 3.3 mmol/L — ABNORMAL LOW (ref 3.5–5.1)
Sodium: 138 mmol/L (ref 135–145)
Total Bilirubin: 1 mg/dL (ref 0.3–1.2)
Total Protein: 7.5 g/dL (ref 6.5–8.1)

## 2023-04-14 LAB — CBC WITH DIFFERENTIAL (CANCER CENTER ONLY)
Abs Immature Granulocytes: 0.03 10*3/uL (ref 0.00–0.07)
Basophils Absolute: 0 10*3/uL (ref 0.0–0.1)
Basophils Relative: 1 %
Eosinophils Absolute: 0 10*3/uL (ref 0.0–0.5)
Eosinophils Relative: 1 %
HCT: 36.6 % (ref 36.0–46.0)
Hemoglobin: 12.5 g/dL (ref 12.0–15.0)
Immature Granulocytes: 1 %
Lymphocytes Relative: 20 %
Lymphs Abs: 1.2 10*3/uL (ref 0.7–4.0)
MCH: 29.1 pg (ref 26.0–34.0)
MCHC: 34.2 g/dL (ref 30.0–36.0)
MCV: 85.3 fL (ref 80.0–100.0)
Monocytes Absolute: 0.6 10*3/uL (ref 0.1–1.0)
Monocytes Relative: 9 %
Neutro Abs: 4.4 10*3/uL (ref 1.7–7.7)
Neutrophils Relative %: 68 %
Platelet Count: 64 10*3/uL — ABNORMAL LOW (ref 150–400)
RBC: 4.29 MIL/uL (ref 3.87–5.11)
RDW: 16 % — ABNORMAL HIGH (ref 11.5–15.5)
WBC Count: 6.3 10*3/uL (ref 4.0–10.5)
nRBC: 0 % (ref 0.0–0.2)

## 2023-04-14 MED ORDER — ROMIPLOSTIM 125 MCG ~~LOC~~ SOLR
85.0000 ug | SUBCUTANEOUS | Status: DC
  Administered 2023-04-14: 85 ug via SUBCUTANEOUS
  Filled 2023-04-14: qty 0.17

## 2023-04-14 NOTE — Patient Instructions (Signed)
Romiplostim Injection What is this medication? ROMIPLOSTIM (roe mi PLOE stim) treats low levels of platelets in your body caused by immune thrombocytopenia (ITP). It is prescribed when other medications have not worked or cannot be tolerated. It may also be used to help people who have been exposed to high doses of radiation. It works by increasing the amount of platelets in your blood. This lowers the risk of bleeding. This medicine may be used for other purposes; ask your health care provider or pharmacist if you have questions. COMMON BRAND NAME(S): Nplate What should I tell my care team before I take this medication? They need to know if you have any of these conditions: Blood clots Myelodysplastic syndrome An unusual or allergic reaction to romiplostim, mannitol, other medications, foods, dyes, or preservatives Pregnant or trying to get pregnant Breast-feeding How should I use this medication? This medication is injected under the skin. It is given by a care team in a hospital or clinic setting. A special MedGuide will be given to you before each treatment. Be sure to read this information carefully each time. Talk to your care team about the use of this medication in children. While it may be prescribed for children as young as newborns for selected conditions, precautions do apply. Overdosage: If you think you have taken too much of this medicine contact a poison control center or emergency room at once. NOTE: This medicine is only for you. Do not share this medicine with others. What if I miss a dose? Keep appointments for follow-up doses. It is important not to miss your dose. Call your care team if you are unable to keep an appointment. What may interact with this medication? Interactions are not expected. This list may not describe all possible interactions. Give your health care provider a list of all the medicines, herbs, non-prescription drugs, or dietary supplements you use. Also  tell them if you smoke, drink alcohol, or use illegal drugs. Some items may interact with your medicine. What should I watch for while using this medication? Visit your care team for regular checks on your progress. You may need blood work done while you are taking this medication. Your condition will be monitored carefully while you are receiving this medication. It is important not to miss any appointments. What side effects may I notice from receiving this medication? Side effects that you should report to your care team as soon as possible: Allergic reactions--skin rash, itching, hives, swelling of the face, lips, tongue, or throat Blood clot--pain, swelling, or warmth in the leg, shortness of breath, chest pain Side effects that usually do not require medical attention (report to your care team if they continue or are bothersome): Dizziness Joint pain Muscle pain Pain in the hands or feet Stomach pain Trouble sleeping This list may not describe all possible side effects. Call your doctor for medical advice about side effects. You may report side effects to FDA at 1-800-FDA-1088. Where should I keep my medication? This medication is given in a hospital or clinic. It will not be stored at home. NOTE: This sheet is a summary. It may not cover all possible information. If you have questions about this medicine, talk to your doctor, pharmacist, or health care provider.  2024 Elsevier/Gold Standard (2021-12-21 00:00:00)  

## 2023-04-15 ENCOUNTER — Inpatient Hospital Stay: Payer: PPO

## 2023-04-29 ENCOUNTER — Other Ambulatory Visit: Payer: Self-pay

## 2023-04-29 ENCOUNTER — Other Ambulatory Visit: Payer: Self-pay | Admitting: Hematology & Oncology

## 2023-04-29 DIAGNOSIS — M545 Low back pain, unspecified: Secondary | ICD-10-CM

## 2023-04-29 DIAGNOSIS — K7581 Nonalcoholic steatohepatitis (NASH): Secondary | ICD-10-CM

## 2023-04-29 DIAGNOSIS — M25559 Pain in unspecified hip: Secondary | ICD-10-CM

## 2023-04-29 MED ORDER — POTASSIUM CHLORIDE CRYS ER 10 MEQ PO TBCR: 10.0000 meq | EXTENDED_RELEASE_TABLET | Freq: Every day | ORAL | 3 refills | Status: DC

## 2023-05-07 ENCOUNTER — Other Ambulatory Visit: Payer: Self-pay | Admitting: Family Medicine

## 2023-05-16 ENCOUNTER — Inpatient Hospital Stay: Payer: PPO

## 2023-05-16 ENCOUNTER — Inpatient Hospital Stay: Payer: PPO | Admitting: Medical Oncology

## 2023-05-16 ENCOUNTER — Other Ambulatory Visit: Payer: Self-pay | Admitting: Family Medicine

## 2023-05-19 ENCOUNTER — Inpatient Hospital Stay: Payer: PPO

## 2023-05-19 ENCOUNTER — Encounter: Payer: Self-pay | Admitting: Hematology & Oncology

## 2023-05-19 ENCOUNTER — Inpatient Hospital Stay: Payer: PPO | Admitting: Hematology & Oncology

## 2023-05-19 ENCOUNTER — Inpatient Hospital Stay: Payer: PPO | Attending: Family

## 2023-05-19 VITALS — BP 121/67 | HR 84 | Temp 98.9°F | Resp 20 | Ht 66.0 in | Wt 186.0 lb

## 2023-05-19 DIAGNOSIS — D696 Thrombocytopenia, unspecified: Secondary | ICD-10-CM

## 2023-05-19 DIAGNOSIS — E119 Type 2 diabetes mellitus without complications: Secondary | ICD-10-CM | POA: Insufficient documentation

## 2023-05-19 DIAGNOSIS — K7581 Nonalcoholic steatohepatitis (NASH): Secondary | ICD-10-CM

## 2023-05-19 DIAGNOSIS — Z79899 Other long term (current) drug therapy: Secondary | ICD-10-CM | POA: Insufficient documentation

## 2023-05-19 DIAGNOSIS — E611 Iron deficiency: Secondary | ICD-10-CM | POA: Diagnosis not present

## 2023-05-19 DIAGNOSIS — D500 Iron deficiency anemia secondary to blood loss (chronic): Secondary | ICD-10-CM

## 2023-05-19 DIAGNOSIS — R161 Splenomegaly, not elsewhere classified: Secondary | ICD-10-CM | POA: Diagnosis not present

## 2023-05-19 DIAGNOSIS — Z7984 Long term (current) use of oral hypoglycemic drugs: Secondary | ICD-10-CM | POA: Diagnosis not present

## 2023-05-19 DIAGNOSIS — D693 Immune thrombocytopenic purpura: Secondary | ICD-10-CM | POA: Diagnosis not present

## 2023-05-19 LAB — CBC WITH DIFFERENTIAL (CANCER CENTER ONLY)
Abs Immature Granulocytes: 0.03 10*3/uL (ref 0.00–0.07)
Basophils Absolute: 0 10*3/uL (ref 0.0–0.1)
Basophils Relative: 1 %
Eosinophils Absolute: 0 10*3/uL (ref 0.0–0.5)
Eosinophils Relative: 1 %
HCT: 39.3 % (ref 36.0–46.0)
Hemoglobin: 13.2 g/dL (ref 12.0–15.0)
Immature Granulocytes: 1 %
Lymphocytes Relative: 22 %
Lymphs Abs: 1.4 10*3/uL (ref 0.7–4.0)
MCH: 28.9 pg (ref 26.0–34.0)
MCHC: 33.6 g/dL (ref 30.0–36.0)
MCV: 86.2 fL (ref 80.0–100.0)
Monocytes Absolute: 0.6 10*3/uL (ref 0.1–1.0)
Monocytes Relative: 10 %
Neutro Abs: 4 10*3/uL (ref 1.7–7.7)
Neutrophils Relative %: 65 %
Platelet Count: 52 10*3/uL — ABNORMAL LOW (ref 150–400)
RBC: 4.56 MIL/uL (ref 3.87–5.11)
RDW: 15.8 % — ABNORMAL HIGH (ref 11.5–15.5)
WBC Count: 6.1 10*3/uL (ref 4.0–10.5)
nRBC: 0 % (ref 0.0–0.2)

## 2023-05-19 LAB — FERRITIN: Ferritin: 25 ng/mL (ref 11–307)

## 2023-05-19 LAB — CMP (CANCER CENTER ONLY)
ALT: 13 U/L (ref 0–44)
AST: 23 U/L (ref 15–41)
Albumin: 4.6 g/dL (ref 3.5–5.0)
Alkaline Phosphatase: 50 U/L (ref 38–126)
Anion gap: 9 (ref 5–15)
BUN: 16 mg/dL (ref 8–23)
CO2: 37 mmol/L — ABNORMAL HIGH (ref 22–32)
Calcium: 9.8 mg/dL (ref 8.9–10.3)
Chloride: 94 mmol/L — ABNORMAL LOW (ref 98–111)
Creatinine: 0.78 mg/dL (ref 0.44–1.00)
GFR, Estimated: >60 mL/min (ref >60)
Glucose, Bld: 176 mg/dL — ABNORMAL HIGH (ref 70–99)
Potassium: 3.5 mmol/L (ref 3.5–5.1)
Sodium: 140 mmol/L (ref 135–145)
Total Bilirubin: 1.3 mg/dL — ABNORMAL HIGH (ref 0.3–1.2)
Total Protein: 7.5 g/dL (ref 6.5–8.1)

## 2023-05-19 LAB — LACTATE DEHYDROGENASE: LDH: 131 U/L (ref 98–192)

## 2023-05-19 LAB — IRON AND IRON BINDING CAPACITY (CC-WL,HP ONLY)
Iron: 122 ug/dL (ref 28–170)
Saturation Ratios: 24 % (ref 10.4–31.8)
TIBC: 500 ug/dL — ABNORMAL HIGH (ref 250–450)
UIBC: 378 ug/dL (ref 148–442)

## 2023-05-19 MED ORDER — ROMIPLOSTIM 125 MCG ~~LOC~~ SOLR
1.0000 ug/kg | SUBCUTANEOUS | Status: DC
  Administered 2023-05-19: 85 ug via SUBCUTANEOUS
  Filled 2023-05-19: qty 0.17

## 2023-05-19 NOTE — Patient Instructions (Signed)
Romiplostim Injection What is this medication? ROMIPLOSTIM (roe mi PLOE stim) treats low levels of platelets in your body caused by immune thrombocytopenia (ITP). It is prescribed when other medications have not worked or cannot be tolerated. It may also be used to help people who have been exposed to high doses of radiation. It works by increasing the amount of platelets in your blood. This lowers the risk of bleeding. This medicine may be used for other purposes; ask your health care provider or pharmacist if you have questions. COMMON BRAND NAME(S): Nplate What should I tell my care team before I take this medication? They need to know if you have any of these conditions: Blood clots Myelodysplastic syndrome An unusual or allergic reaction to romiplostim, mannitol, other medications, foods, dyes, or preservatives Pregnant or trying to get pregnant Breast-feeding How should I use this medication? This medication is injected under the skin. It is given by a care team in a hospital or clinic setting. A special MedGuide will be given to you before each treatment. Be sure to read this information carefully each time. Talk to your care team about the use of this medication in children. While it may be prescribed for children as young as newborns for selected conditions, precautions do apply. Overdosage: If you think you have taken too much of this medicine contact a poison control center or emergency room at once. NOTE: This medicine is only for you. Do not share this medicine with others. What if I miss a dose? Keep appointments for follow-up doses. It is important not to miss your dose. Call your care team if you are unable to keep an appointment. What may interact with this medication? Interactions are not expected. This list may not describe all possible interactions. Give your health care provider a list of all the medicines, herbs, non-prescription drugs, or dietary supplements you use. Also  tell them if you smoke, drink alcohol, or use illegal drugs. Some items may interact with your medicine. What should I watch for while using this medication? Visit your care team for regular checks on your progress. You may need blood work done while you are taking this medication. Your condition will be monitored carefully while you are receiving this medication. It is important not to miss any appointments. What side effects may I notice from receiving this medication? Side effects that you should report to your care team as soon as possible: Allergic reactions--skin rash, itching, hives, swelling of the face, lips, tongue, or throat Blood clot--pain, swelling, or warmth in the leg, shortness of breath, chest pain Side effects that usually do not require medical attention (report to your care team if they continue or are bothersome): Dizziness Joint pain Muscle pain Pain in the hands or feet Stomach pain Trouble sleeping This list may not describe all possible side effects. Call your doctor for medical advice about side effects. You may report side effects to FDA at 1-800-FDA-1088. Where should I keep my medication? This medication is given in a hospital or clinic. It will not be stored at home. NOTE: This sheet is a summary. It may not cover all possible information. If you have questions about this medicine, talk to your doctor, pharmacist, or health care provider.  2024 Elsevier/Gold Standard (2021-12-21 00:00:00)

## 2023-05-19 NOTE — Progress Notes (Signed)
Hematology and Oncology Follow Up Visit  Rachael Buck [id number] 1940-10-25 82 y.o. 05/19/2023   Principle Diagnosis:  Thrombocytopenia -- NASH/Splenomegaly ; Immune based thrombocytopenia Iron deficiency    Current Therapy:        Nplate as indicated  IV iron as indicated  -- Feraheme given on -07/30/2022     Interim History:  Rachael Buck is here today for follow-up.  It has now been 8 months since her husband passed away.  This is still hard on her.  I totally understand this.  She seems to be doing okay.  She says she is having some more episodes of falling.  Thankfully, there is no problems with her break anything.  The 1 issue that she has Rachael Buck sounds like she is going need a tooth extracted.  She is not sure when this will be.  We really will need to get her platelet count up when she has this done.  We probably will have to give her Nplate weekly until she has this procedure done.  I told her to let us know when she is going to have the extraction so we can coordinate our Nplate.  She has had no obvious bleeding.  She has had a little bit of abdominal swelling.  This is always been a problem for her.  There is no change in bowel or bladder habits.  She is watching what she eats.  Diabetes has always been an issue for her.  She has had no fever.  She has had no headache.  She has had a little bit of swelling in the legs.  Overall, I would say that her performance status is probably ECOG 1.     Medications:  Allergies as of 05/19/2023       Reactions   Codeine Nausea And Vomiting   Metformin And Related Diarrhea   Shrimp [shellfish Allergy] Swelling        Medication List        Accurate as of May 19, 2023 12:35 PM. If you have any questions, ask your nurse or doctor.          STOP taking these medications    hydrochlorothiazide 25 MG tablet Commonly known as: HYDRODIURIL Stopped by: Rachael Buck   Melatonin 10 MG Tabs Stopped by:  Rachael Buck       TAKE these medications    acetaminophen 500 MG tablet Commonly known as: TYLENOL Take 500 mg by mouth every 6 (six) hours as needed for mild pain.   albuterol 108 (90 Base) MCG/ACT inhaler Commonly known as: ProAir HFA Inhale 2 puffs into the lungs every 4 (four) hours as needed for wheezing or shortness of breath.   ALPRAZolam 0.5 MG tablet Commonly known as: XANAX TAKE 1 TABLET(0.5 MG) BY MOUTH THREE TIMES DAILY   bismuth subsalicylate 262 MG/15ML suspension Commonly known as: PEPTO BISMOL Take 30 mLs by mouth every 4 (four) hours as needed.   blood glucose meter kit and supplies Kit Dispense based on patient and insurance preference. Use up to four times daily as directed.   colestipol 1 g tablet Commonly known as: COLESTID 1 g daily.   dapagliflozin propanediol 5 MG Tabs tablet Commonly known as: Farxiga Take 1 tablet (5 mg total) by mouth daily before breakfast.   glipiZIDE 5 MG 24 hr tablet Commonly known as: GLUCOTROL XL TAKE 1 TABLET(5 MG) BY MOUTH DAILY WITH BREAKFAST   hydrocortisone 2.5 % cream Apply 1 application topically daily  as needed (itchy dry skin).   ipratropium-albuterol 0.5-2.5 (3) MG/3ML Soln Commonly known as: DUONEB Use one 3 ml neb treatment as needed every 6-8 hours prn   loperamide 2 MG capsule Commonly known as: IMODIUM Take 4-6 mg by mouth daily as needed (for IBS symptoms).   losartan 50 MG tablet Commonly known as: COZAAR Take 1 tablet (50 mg total) by mouth daily.   OneTouch Delica Lancets Fine Misc 1 each by Other route daily.   OneTouch Ultra test strip Generic drug: glucose blood TEST BLOOD SUGAR(S) ONCE DAILY IN THE MORNING   potassium chloride 10 MEQ tablet Commonly known as: KLOR-CON M Take 1 tablet (10 mEq total) by mouth daily.   rifaximin 550 MG Tabs tablet Commonly known as: Xifaxan Take 1 tablet (550 mg total) by mouth 3 (three) times daily. Take for 2 weeks   sertraline 100 MG  tablet Commonly known as: ZOLOFT Take 1.5 tablets (150 mg total) by mouth daily.   simvastatin 80 MG tablet Commonly known as: ZOCOR TAKE 1 TABLET(80 MG) BY MOUTH DAILY   Stiolto Respimat 2.5-2.5 MCG/ACT Aers Generic drug: Tiotropium Bromide-Olodaterol Inhale 2 puffs into the lungs daily.   sucralfate 1 g tablet Commonly known as: CARAFATE Take 1 g by mouth 4 (four) times daily as needed.   traMADol 50 MG tablet Commonly known as: ULTRAM TAKE 1 TABLET(50 MG) BY MOUTH EVERY 6 HOURS AS NEEDED        Allergies:  Allergies  Allergen Reactions   Codeine Nausea And Vomiting   Metformin And Related Diarrhea   Shrimp [Shellfish Allergy] Swelling    Past Medical History, Surgical history, Social history, and Family History were reviewed and updated.  Review of Systems: Review of Systems  Constitutional:  Positive for malaise/fatigue.  HENT: Negative.    Eyes: Negative.   Respiratory: Negative.    Cardiovascular: Negative.   Gastrointestinal:  Positive for abdominal pain.  Genitourinary: Negative.   Musculoskeletal: Negative.   Skin: Negative.   Neurological: Negative.   Endo/Heme/Allergies: Negative.   Psychiatric/Behavioral: Negative.       Physical Exam:  height is 5\' 6"  (1.676 m) and weight is 186 lb (84.4 kg). Her oral temperature is 98.9 F (37.2 C). Her blood pressure is 121/67 and her pulse is 84. Her respiration is 20 and oxygen saturation is 95%.   Wt Readings from Last 3 Encounters:  05/19/23 186 lb (84.4 kg)  03/16/23 186 lb (84.4 kg)  03/09/23 185 lb (83.9 kg)    Physical Exam Vitals reviewed.  HENT:     Head: Normocephalic and atraumatic.  Eyes:     Pupils: Pupils are equal, round, and reactive to light.  Cardiovascular:     Rate and Rhythm: Normal rate and regular rhythm.     Heart sounds: Normal heart sounds.  Pulmonary:     Effort: Pulmonary effort is normal.     Breath sounds: Normal breath sounds.  Abdominal:     General: Bowel sounds  are normal.     Palpations: Abdomen is soft.  Musculoskeletal:        General: No tenderness or deformity. Normal range of motion.     Cervical back: Normal range of motion.  Lymphadenopathy:     Cervical: No cervical adenopathy.  Skin:    General: Skin is warm and dry.     Findings: No erythema or rash.  Neurological:     Mental Status: She is alert and oriented to person, place, and time.  Psychiatric:        Behavior: Behavior normal.        Thought Content: Thought content normal.        Judgment: Judgment normal.      Lab Results  Component Value Date   WBC 6.1 05/19/2023   HGB 13.2 05/19/2023   HCT 39.3 05/19/2023   MCV 86.2 05/19/2023   PLT 52 (L) 05/19/2023   Lab Results  Component Value Date   FERRITIN 29 03/16/2023   IRON 101 03/16/2023   TIBC 445 03/16/2023   UIBC 344 03/16/2023   IRONPCTSAT 23 03/16/2023   Lab Results  Component Value Date   RETICCTPCT 2.3 12/14/2022   RBC 4.56 05/19/2023   No results found for: "KPAFRELGTCHN", "LAMBDASER", "KAPLAMBRATIO" No results found for: "IGGSERUM", "IGA", "IGMSERUM" No results found for: "TOTALPROTELP", "ALBUMINELP", "A1GS", "A2GS", "BETS", "BETA2SER", "GAMS", "MSPIKE", "SPEI"   Chemistry      Component Value Date/Time   NA 140 05/19/2023 1115   NA 127 (L) 02/14/2017 1335   K 3.5 05/19/2023 1115   K 3.6 02/14/2017 1335   CL 94 (L) 05/19/2023 1115   CL 92 (L) 02/14/2017 1335   CO2 37 (H) 05/19/2023 1115   CO2 28 02/14/2017 1335   BUN 16 05/19/2023 1115   BUN 7 (L) 02/14/2017 1335   CREATININE 0.78 05/19/2023 1115   CREATININE 0.47 (L) 02/14/2017 1335   CREATININE 0.56 07/24/2014 1204      Component Value Date/Time   CALCIUM 9.8 05/19/2023 1115   CALCIUM 9.3 02/14/2017 1335   ALKPHOS 50 05/19/2023 1115   ALKPHOS 68 02/14/2017 1335   AST 23 05/19/2023 1115   ALT 13 05/19/2023 1115   BILITOT 1.3 (H) 05/19/2023 1115      Impression and Plan: Ms. 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  We will go ahead and give her Nplate today.  Again, she will let us know about the time for the dental extraction.  Once we know the time, we will then have her come back weekly for Nplate to get her platelet count above 80,000.  I do feel bad for her because of her husband's passing.  I know this is tough on her.  They were married for such a long time.  I would like to see her back in about 6 weeks or so.  Rachael Macho, MD 9/19/202412:35 PM

## 2023-06-07 ENCOUNTER — Other Ambulatory Visit: Payer: Self-pay | Admitting: Hematology & Oncology

## 2023-06-07 DIAGNOSIS — M25559 Pain in unspecified hip: Secondary | ICD-10-CM

## 2023-06-07 DIAGNOSIS — M545 Low back pain, unspecified: Secondary | ICD-10-CM

## 2023-06-17 ENCOUNTER — Other Ambulatory Visit (HOSPITAL_COMMUNITY): Payer: Self-pay

## 2023-06-17 ENCOUNTER — Encounter: Payer: Self-pay | Admitting: Hematology & Oncology

## 2023-06-17 ENCOUNTER — Inpatient Hospital Stay: Payer: PPO

## 2023-06-17 ENCOUNTER — Telehealth: Payer: Self-pay

## 2023-06-17 ENCOUNTER — Telehealth: Payer: Self-pay | Admitting: *Deleted

## 2023-06-17 ENCOUNTER — Inpatient Hospital Stay: Payer: PPO | Attending: Family

## 2023-06-17 ENCOUNTER — Inpatient Hospital Stay (HOSPITAL_BASED_OUTPATIENT_CLINIC_OR_DEPARTMENT_OTHER): Payer: PPO | Admitting: Hematology & Oncology

## 2023-06-17 VITALS — BP 128/55 | HR 79 | Temp 98.2°F | Resp 24 | Ht 66.0 in | Wt 186.1 lb

## 2023-06-17 DIAGNOSIS — R1900 Intra-abdominal and pelvic swelling, mass and lump, unspecified site: Secondary | ICD-10-CM | POA: Diagnosis not present

## 2023-06-17 DIAGNOSIS — Z79899 Other long term (current) drug therapy: Secondary | ICD-10-CM | POA: Insufficient documentation

## 2023-06-17 DIAGNOSIS — D693 Immune thrombocytopenic purpura: Secondary | ICD-10-CM | POA: Insufficient documentation

## 2023-06-17 DIAGNOSIS — Z7984 Long term (current) use of oral hypoglycemic drugs: Secondary | ICD-10-CM | POA: Diagnosis not present

## 2023-06-17 DIAGNOSIS — D509 Iron deficiency anemia, unspecified: Secondary | ICD-10-CM | POA: Insufficient documentation

## 2023-06-17 DIAGNOSIS — D696 Thrombocytopenia, unspecified: Secondary | ICD-10-CM

## 2023-06-17 DIAGNOSIS — K7581 Nonalcoholic steatohepatitis (NASH): Secondary | ICD-10-CM

## 2023-06-17 LAB — CBC WITH DIFFERENTIAL (CANCER CENTER ONLY)
Abs Immature Granulocytes: 0.04 10*3/uL (ref 0.00–0.07)
Basophils Absolute: 0 10*3/uL (ref 0.0–0.1)
Basophils Relative: 1 %
Eosinophils Absolute: 0 10*3/uL (ref 0.0–0.5)
Eosinophils Relative: 1 %
HCT: 37.4 % (ref 36.0–46.0)
Hemoglobin: 12.7 g/dL (ref 12.0–15.0)
Immature Granulocytes: 1 %
Lymphocytes Relative: 23 %
Lymphs Abs: 1.2 10*3/uL (ref 0.7–4.0)
MCH: 28.8 pg (ref 26.0–34.0)
MCHC: 34 g/dL (ref 30.0–36.0)
MCV: 84.8 fL (ref 80.0–100.0)
Monocytes Absolute: 0.6 10*3/uL (ref 0.1–1.0)
Monocytes Relative: 10 %
Neutro Abs: 3.4 10*3/uL (ref 1.7–7.7)
Neutrophils Relative %: 64 %
Platelet Count: 56 10*3/uL — ABNORMAL LOW (ref 150–400)
RBC: 4.41 MIL/uL (ref 3.87–5.11)
RDW: 15.8 % — ABNORMAL HIGH (ref 11.5–15.5)
WBC Count: 5.3 10*3/uL (ref 4.0–10.5)
nRBC: 0 % (ref 0.0–0.2)

## 2023-06-17 LAB — RETICULOCYTES
Immature Retic Fract: 9.7 % (ref 2.3–15.9)
RBC.: 4.37 MIL/uL (ref 3.87–5.11)
Retic Count, Absolute: 81.7 10*3/uL (ref 19.0–186.0)
Retic Ct Pct: 1.9 % (ref 0.4–3.1)

## 2023-06-17 LAB — CMP (CANCER CENTER ONLY)
ALT: 11 U/L (ref 0–44)
AST: 21 U/L (ref 15–41)
Albumin: 4.3 g/dL (ref 3.5–5.0)
Alkaline Phosphatase: 45 U/L (ref 38–126)
Anion gap: 9 (ref 5–15)
BUN: 19 mg/dL (ref 8–23)
CO2: 37 mmol/L — ABNORMAL HIGH (ref 22–32)
Calcium: 9.4 mg/dL (ref 8.9–10.3)
Chloride: 97 mmol/L — ABNORMAL LOW (ref 98–111)
Creatinine: 0.8 mg/dL (ref 0.44–1.00)
GFR, Estimated: >60 mL/min (ref >60)
Glucose, Bld: 133 mg/dL — ABNORMAL HIGH (ref 70–99)
Potassium: 3.5 mmol/L (ref 3.5–5.1)
Sodium: 143 mmol/L (ref 135–145)
Total Bilirubin: 1.1 mg/dL (ref 0.3–1.2)
Total Protein: 7.4 g/dL (ref 6.5–8.1)

## 2023-06-17 LAB — IRON AND IRON BINDING CAPACITY (CC-WL,HP ONLY)
Iron: 95 ug/dL (ref 28–170)
Saturation Ratios: 21 % (ref 10.4–31.8)
TIBC: 455 ug/dL — ABNORMAL HIGH (ref 250–450)
UIBC: 360 ug/dL (ref 148–442)

## 2023-06-17 LAB — LACTATE DEHYDROGENASE: LDH: 131 U/L (ref 98–192)

## 2023-06-17 LAB — FERRITIN: Ferritin: 32 ng/mL (ref 11–307)

## 2023-06-17 MED ORDER — DOPTELET 20 MG PO TABS
40.0000 mg | ORAL_TABLET | Freq: Every day | ORAL | 2 refills | Status: DC
  Filled 2023-06-30: qty 30, 15d supply, fill #0

## 2023-06-17 MED ORDER — ROMIPLOSTIM 125 MCG ~~LOC~~ SOLR
1.0000 ug/kg | SUBCUTANEOUS | Status: DC
  Administered 2023-06-17: 85 ug via SUBCUTANEOUS
  Filled 2023-06-17: qty 0.17

## 2023-06-17 NOTE — Telephone Encounter (Signed)
-----   Message from Josph Macho sent at 06/17/2023  4:06 PM EDT ----- Please call and let her know that the iron studies are okay.  Thanks.

## 2023-06-17 NOTE — Patient Instructions (Signed)
Romiplostim Injection What is this medication? ROMIPLOSTIM (roe mi PLOE stim) treats low levels of platelets in your body caused by immune thrombocytopenia (ITP). It is prescribed when other medications have not worked or cannot be tolerated. It may also be used to help people who have been exposed to high doses of radiation. It works by increasing the amount of platelets in your blood. This lowers the risk of bleeding. This medicine may be used for other purposes; ask your health care provider or pharmacist if you have questions. COMMON BRAND NAME(S): Nplate What should I tell my care team before I take this medication? They need to know if you have any of these conditions: Blood clots Myelodysplastic syndrome An unusual or allergic reaction to romiplostim, mannitol, other medications, foods, dyes, or preservatives Pregnant or trying to get pregnant Breast-feeding How should I use this medication? This medication is injected under the skin. It is given by a care team in a hospital or clinic setting. A special MedGuide will be given to you before each treatment. Be sure to read this information carefully each time. Talk to your care team about the use of this medication in children. While it may be prescribed for children as young as newborns for selected conditions, precautions do apply. Overdosage: If you think you have taken too much of this medicine contact a poison control center or emergency room at once. NOTE: This medicine is only for you. Do not share this medicine with others. What if I miss a dose? Keep appointments for follow-up doses. It is important not to miss your dose. Call your care team if you are unable to keep an appointment. What may interact with this medication? Interactions are not expected. This list may not describe all possible interactions. Give your health care provider a list of all the medicines, herbs, non-prescription drugs, or dietary supplements you use. Also  tell them if you smoke, drink alcohol, or use illegal drugs. Some items may interact with your medicine. What should I watch for while using this medication? Visit your care team for regular checks on your progress. You may need blood work done while you are taking this medication. Your condition will be monitored carefully while you are receiving this medication. It is important not to miss any appointments. What side effects may I notice from receiving this medication? Side effects that you should report to your care team as soon as possible: Allergic reactions--skin rash, itching, hives, swelling of the face, lips, tongue, or throat Blood clot--pain, swelling, or warmth in the leg, shortness of breath, chest pain Side effects that usually do not require medical attention (report to your care team if they continue or are bothersome): Dizziness Joint pain Muscle pain Pain in the hands or feet Stomach pain Trouble sleeping This list may not describe all possible side effects. Call your doctor for medical advice about side effects. You may report side effects to FDA at 1-800-FDA-1088. Where should I keep my medication? This medication is given in a hospital or clinic. It will not be stored at home. NOTE: This sheet is a summary. It may not cover all possible information. If you have questions about this medicine, talk to your doctor, pharmacist, or health care provider.  2024 Elsevier/Gold Standard (2021-12-21 00:00:00)

## 2023-06-17 NOTE — Telephone Encounter (Signed)
Oral Oncology Patient Advocate Encounter  New authorization   Received notification that prior authorization for Doptelet is required.   PA submitted on 06/17/23  Key [id number]  Status is pending     Ardeen Fillers, CPhT Oncology Pharmacy Patient Advocate  [facility]  [phone] (phone) [phone] (fax) 06/17/2023 2:31 PM

## 2023-06-17 NOTE — Progress Notes (Signed)
Hematology and Oncology Follow Up Visit  Rachael Buck [id number] 1940-10-10 82 y.o. 06/17/2023   Principle Diagnosis:  Thrombocytopenia -- NASH/Splenomegaly ; Immune based thrombocytopenia Iron deficiency    Current Therapy:        Nplate as indicated  IV iron as indicated  -- Feraheme given on -07/30/2022   Doptelet 40 mg po q day -- start on 06/23/2023 for dental extraction   Interim History:  Rachael Buck is here today for follow-up.  Unfortunately, she really has not responded to the nplate as I thought she would.  As such, I think would stop the Nplate and try her on Doptelet (40 mg p.o. daily) and see how this may get her platelet count of for the tooth extraction.  She has yet to find an oral surgeon to do the procedure.  Hopefully, she will be able to find 1.  She said that her dermatologist would not remove the sebaceous cyst from her back because of her thrombocytopenia.  Platelet count is mildly depressed.  I suspect that the platelet is probably function pretty well.  She has had no bleeding.  She still has some abdominal swelling.  I think she will always have this.  We have checked this several times.  There is really no ascites.  I think this is all from her NASH.  I think that she has had no fever.  There is been no bleeding.  Is been no change in bowel or bladder habits.  Overall, I would say that her performance status is probably ECOG 2.    Medications:  Allergies as of 06/17/2023       Reactions   Codeine Nausea And Vomiting   Metformin And Related Diarrhea   Shrimp [shellfish Allergy] Swelling        Medication List        Accurate as of June 17, 2023  1:30 PM. If you have any questions, ask your nurse or doctor.          acetaminophen 500 MG tablet Commonly known as: TYLENOL Take 500 mg by mouth every 6 (six) hours as needed for mild pain.   albuterol 108 (90 Base) MCG/ACT inhaler Commonly known as: ProAir HFA Inhale 2 puffs into  the lungs every 4 (four) hours as needed for wheezing or shortness of breath.   ALPRAZolam 0.5 MG tablet Commonly known as: XANAX TAKE 1 TABLET(0.5 MG) BY MOUTH THREE TIMES DAILY   bismuth subsalicylate 262 MG/15ML suspension Commonly known as: PEPTO BISMOL Take 30 mLs by mouth every 4 (four) hours as needed.   blood glucose meter kit and supplies Kit Dispense based on patient and insurance preference. Use up to four times daily as directed.   colestipol 1 g tablet Commonly known as: COLESTID 1 g daily.   dapagliflozin propanediol 5 MG Tabs tablet Commonly known as: Farxiga Take 1 tablet (5 mg total) by mouth daily before breakfast.   glipiZIDE 5 MG 24 hr tablet Commonly known as: GLUCOTROL XL TAKE 1 TABLET(5 MG) BY MOUTH DAILY WITH BREAKFAST   hydrocortisone 2.5 % cream Apply 1 application topically daily as needed (itchy dry skin).   ipratropium-albuterol 0.5-2.5 (3) MG/3ML Soln Commonly known as: DUONEB Use one 3 ml neb treatment as needed every 6-8 hours prn   loperamide 2 MG capsule Commonly known as: IMODIUM Take 4-6 mg by mouth daily as needed (for IBS symptoms).   losartan 50 MG tablet Commonly known as: COZAAR Take 1 tablet (50 mg total)  by mouth daily.   OneTouch Delica Lancets Fine Misc 1 each by Other route daily.   OneTouch Ultra test strip Generic drug: glucose blood TEST BLOOD SUGAR(S) ONCE DAILY IN THE MORNING   potassium chloride 10 MEQ tablet Commonly known as: KLOR-CON M Take 1 tablet (10 mEq total) by mouth daily.   rifaximin 550 MG Tabs tablet Commonly known as: Xifaxan Take 1 tablet (550 mg total) by mouth 3 (three) times daily. Take for 2 weeks   sertraline 100 MG tablet Commonly known as: ZOLOFT Take 1.5 tablets (150 mg total) by mouth daily.   simvastatin 80 MG tablet Commonly known as: ZOCOR TAKE 1 TABLET(80 MG) BY MOUTH DAILY   Stiolto Respimat 2.5-2.5 MCG/ACT Aers Generic drug: Tiotropium Bromide-Olodaterol Inhale 2 puffs  into the lungs daily.   sucralfate 1 g tablet Commonly known as: CARAFATE Take 1 g by mouth 4 (four) times daily as needed.   traMADol 50 MG tablet Commonly known as: ULTRAM TAKE 1 TABLET(50 MG) BY MOUTH EVERY 6 HOURS AS NEEDED        Allergies:  Allergies  Allergen Reactions   Codeine Nausea And Vomiting   Metformin And Related Diarrhea   Shrimp [Shellfish Allergy] Swelling    Past Medical History, Surgical history, Social history, and Family History were reviewed and updated.  Review of Systems: Review of Systems  Constitutional:  Positive for malaise/fatigue.  HENT: Negative.    Eyes: Negative.   Respiratory: Negative.    Cardiovascular: Negative.   Gastrointestinal:  Positive for abdominal pain.  Genitourinary: Negative.   Musculoskeletal: Negative.   Skin: Negative.   Neurological: Negative.   Endo/Heme/Allergies: Negative.   Psychiatric/Behavioral: Negative.       Physical Exam:  height is 5\' 6"  (1.676 m) and weight is 186 lb 1.3 oz (84.4 kg). Her oral temperature is 98.2 F (36.8 C). Her blood pressure is 128/55 (abnormal) and her pulse is 79. Her respiration is 24 (abnormal) and oxygen saturation is 97%.   Wt Readings from Last 3 Encounters:  06/17/23 186 lb 1.3 oz (84.4 kg)  05/19/23 186 lb (84.4 kg)  03/16/23 186 lb (84.4 kg)    Physical Exam Vitals reviewed.  HENT:     Head: Normocephalic and atraumatic.  Eyes:     Pupils: Pupils are equal, round, and reactive to light.  Cardiovascular:     Rate and Rhythm: Normal rate and regular rhythm.     Heart sounds: Normal heart sounds.  Pulmonary:     Effort: Pulmonary effort is normal.     Breath sounds: Normal breath sounds.  Abdominal:     General: Bowel sounds are normal.     Palpations: Abdomen is soft.  Musculoskeletal:        General: No tenderness or deformity. Normal range of motion.     Cervical back: Normal range of motion.  Lymphadenopathy:     Cervical: No cervical adenopathy.   Skin:    General: Skin is warm and dry.     Findings: No erythema or rash.  Neurological:     Mental Status: She is alert and oriented to person, place, and time.  Psychiatric:        Behavior: Behavior normal.        Thought Content: Thought content normal.        Judgment: Judgment normal.     Lab Results  Component Value Date   WBC 5.3 06/17/2023   HGB 12.7 06/17/2023   HCT 37.4 06/17/2023  MCV 84.8 06/17/2023   PLT 56 (L) 06/17/2023   Lab Results  Component Value Date   FERRITIN 25 05/19/2023   IRON 122 05/19/2023   TIBC 500 (H) 05/19/2023   UIBC 378 05/19/2023   IRONPCTSAT 24 05/19/2023   Lab Results  Component Value Date   RETICCTPCT 1.9 06/17/2023   RBC 4.37 06/17/2023   No results found for: "KPAFRELGTCHN", "LAMBDASER", "KAPLAMBRATIO" No results found for: "IGGSERUM", "IGA", "IGMSERUM" No results found for: "TOTALPROTELP", "ALBUMINELP", "A1GS", "A2GS", "BETS", "BETA2SER", "GAMS", "MSPIKE", "SPEI"   Chemistry      Component Value Date/Time   NA 143 06/17/2023 1210   NA 127 (L) 02/14/2017 1335   K 3.5 06/17/2023 1210   K 3.6 02/14/2017 1335   CL 97 (L) 06/17/2023 1210   CL 92 (L) 02/14/2017 1335   CO2 37 (H) 06/17/2023 1210   CO2 28 02/14/2017 1335   BUN 19 06/17/2023 1210   BUN 7 (L) 02/14/2017 1335   CREATININE 0.80 06/17/2023 1210   CREATININE 0.47 (L) 02/14/2017 1335   CREATININE 0.56 07/24/2014 1204      Component Value Date/Time   CALCIUM 9.4 06/17/2023 1210   CALCIUM 9.3 02/14/2017 1335   ALKPHOS 45 06/17/2023 1210   ALKPHOS 68 02/14/2017 1335   AST 21 06/17/2023 1210   ALT 11 06/17/2023 1210   BILITOT 1.1 06/17/2023 1210      Impression and Plan: Ms. 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  We will give her Nplate today.  This will be her last Nplate dose.  After this, we will hopefully see if Doptelet can get her platelet count high enough for the tooth extraction.   I think that her platelet count got around 80,000, she should be well protected from bleeding.  She will let us know about the oral surgeon who will do the extraction.  Once we know who that is, then we can talk to him and let him know what we are doing.  I will plan to see her back in about a month or so.  Josph Macho, MD 10/18/20241:30 PM

## 2023-06-20 ENCOUNTER — Telehealth: Payer: Self-pay

## 2023-06-20 ENCOUNTER — Other Ambulatory Visit (HOSPITAL_COMMUNITY): Payer: Self-pay

## 2023-06-20 NOTE — Telephone Encounter (Addendum)
Oral Oncology Patient Advocate Encounter  Prior Authorization for Doptelet has been approved.    PA# [id number]  Effective dates: 06/17/23 through 07/18/23  Patients co-pay is $2,486.54.   PAP initiated. See additional encounter.    Ardeen Fillers, CPhT Oncology Pharmacy Patient Advocate  [facility]  [phone] (phone) [phone] (fax) 06/20/2023 8:07 AM

## 2023-06-20 NOTE — Telephone Encounter (Signed)
Called and left VM for patient and patient's daughter, Olegario Messier, to call me back to set up time to obtain patient signatures and financial information for submitting application. I will continue to reach out.   1st Attempt - LVM for patient and patient's daughter 06/20/23   Ardeen Fillers, CPhT Oncology Pharmacy Patient Advocate  [facility]  [phone] (phone) [phone] (fax) 06/20/2023 11:09 AM

## 2023-06-20 NOTE — Telephone Encounter (Signed)
Oral Oncology Patient Advocate Encounter   Began application for assistance for Doptelet through Doptelet Connect Patient Assistance Program.   Application will be submitted upon completion of necessary supporting documentation.   Doptelet's phone number [phone].   I will continue to check the status until final determination.    Ardeen Fillers, CPhT Oncology Pharmacy Patient Advocate  [facility]  [phone] (phone) [phone] (fax) 06/20/2023 11:07 AM

## 2023-06-21 NOTE — Telephone Encounter (Signed)
Oral Oncology Patient Advocate Encounter  Reached out and spoke with patient regarding PAP paperwork, explained that I would send it to their preferred email via DocuSign.   Confirmed email address: [email or web address].    Patient expressed understanding and consent.  Will follow up once paperwork has been signed and returned.   Ardeen Fillers, CPhT Oncology Pharmacy Patient Advocate  [facility]  [phone] (phone) [phone] (fax) 06/21/2023 8:33 AM

## 2023-06-27 ENCOUNTER — Telehealth: Payer: Self-pay

## 2023-06-27 NOTE — Telephone Encounter (Signed)
Oral Oncology Patient Advocate Encounter   Submitted application for assistance for Doptelet to Doptelet Connect Patient Assistance Program.   Application submitted via e-fax to [phone]   Doptelet Connect's phone number [phone].   I will continue to check the status until final determination.    Ardeen Fillers, CPhT Oncology Pharmacy Patient Advocate  [facility]  [phone] (phone) [phone] (fax) 06/27/2023 10:26 AM

## 2023-06-27 NOTE — Telephone Encounter (Signed)
Received phone call from patient who was confused on next steps to take with the Doptelet and having dental work. Pt aware that the medication was approved but awaiting patience assistance approval so she doesn't have a large copay. Pt aware that once she receives the medication and starts to take it to call the office so we can schedule a lab appointment 2 weeks from start to check her platelets. Once her platelets are 80 or above then she can schedule the dental work. Pt states she is going to [place] [facility] for her dental work. Pt verbalized understanding and had no further questions.

## 2023-06-30 ENCOUNTER — Other Ambulatory Visit (HOSPITAL_COMMUNITY): Payer: Self-pay

## 2023-06-30 ENCOUNTER — Other Ambulatory Visit: Payer: Self-pay

## 2023-06-30 ENCOUNTER — Encounter: Payer: Self-pay | Admitting: Hematology & Oncology

## 2023-06-30 NOTE — Telephone Encounter (Signed)
Called to check status of application. Informed by representative that patient's insurance would not provide them with co-payment information. I provided representative with Prior Authorization information and co-pay information as well as faxed over copy of Prior Authorization Approval Letter. Representative indicated that they could continue processing application. I will continue to follow and update until final determination.    Ardeen Fillers, CPhT Oncology Pharmacy Patient Advocate  [facility]  [phone] (phone) [phone] (fax) 06/30/2023 7:56 AM

## 2023-06-30 NOTE — Telephone Encounter (Signed)
Oral Oncology Patient Advocate Encounter   Received notification that the application for assistance for Doptelet through Doptelet Connect Patient Assistance Program has been approved.   Doptelet's phone number [phone].   Effective dates: 06/30/23 through 08/30/23  Medication will be filled at [facility].   Ardeen Fillers, CPhT Oncology Pharmacy Patient Advocate  [facility]  [phone] (phone) [phone] (fax) 06/30/2023 2:16 PM

## 2023-06-30 NOTE — Telephone Encounter (Signed)
Received notification via fax that Benefits Investigation was completed. I called Doptelet Connect to inquire about further steps and was informed that application has been triaged to the Patient Assistance Program for final processing and determination. Representative stated I should receive notification via fax either today, 06/30/23, or tomorrow, 07/01/23, informing us of decision. I will continue to follow and update until final determination.   Ardeen Fillers, CPhT Oncology Pharmacy Patient Advocate  [facility]  [phone] (phone) [phone] (fax) 06/30/2023 1:39 PM

## 2023-06-30 NOTE — Telephone Encounter (Signed)
Called and spoke to patient's daughter, Rachael Buck, regarding approval for Doptelet Connect Patient Assistance Program. Rachael Buck confirmed that they did speak with Doptelet Connect and have scheduled delivery via [employer] for Tuesday, 07/05/23. Patient and daughter know to call me at [phone] with any questions or concerns regarding receiving medication from [facility].    Ardeen Fillers, CPhT Oncology Pharmacy Patient Advocate  [facility]  [phone] (phone) [phone] (fax) 06/30/2023 2:49 PM

## 2023-07-05 ENCOUNTER — Inpatient Hospital Stay: Payer: PPO | Attending: Family

## 2023-07-05 ENCOUNTER — Telehealth: Payer: Self-pay

## 2023-07-05 DIAGNOSIS — Z79899 Other long term (current) drug therapy: Secondary | ICD-10-CM | POA: Diagnosis not present

## 2023-07-05 DIAGNOSIS — D693 Immune thrombocytopenic purpura: Secondary | ICD-10-CM | POA: Diagnosis not present

## 2023-07-05 DIAGNOSIS — Z7984 Long term (current) use of oral hypoglycemic drugs: Secondary | ICD-10-CM | POA: Diagnosis not present

## 2023-07-05 DIAGNOSIS — D509 Iron deficiency anemia, unspecified: Secondary | ICD-10-CM | POA: Insufficient documentation

## 2023-07-05 DIAGNOSIS — R161 Splenomegaly, not elsewhere classified: Secondary | ICD-10-CM | POA: Diagnosis not present

## 2023-07-05 DIAGNOSIS — D696 Thrombocytopenia, unspecified: Secondary | ICD-10-CM

## 2023-07-05 DIAGNOSIS — K7581 Nonalcoholic steatohepatitis (NASH): Secondary | ICD-10-CM | POA: Diagnosis not present

## 2023-07-05 LAB — CBC WITH DIFFERENTIAL (CANCER CENTER ONLY)
Abs Immature Granulocytes: 0.04 10*3/uL (ref 0.00–0.07)
Basophils Absolute: 0.1 10*3/uL (ref 0.0–0.1)
Basophils Relative: 1 %
Eosinophils Absolute: 0 10*3/uL (ref 0.0–0.5)
Eosinophils Relative: 1 %
HCT: 38.3 % (ref 36.0–46.0)
Hemoglobin: 13 g/dL (ref 12.0–15.0)
Immature Granulocytes: 1 %
Lymphocytes Relative: 18 %
Lymphs Abs: 1.1 10*3/uL (ref 0.7–4.0)
MCH: 28.7 pg (ref 26.0–34.0)
MCHC: 33.9 g/dL (ref 30.0–36.0)
MCV: 84.5 fL (ref 80.0–100.0)
Monocytes Absolute: 0.5 10*3/uL (ref 0.1–1.0)
Monocytes Relative: 8 %
Neutro Abs: 4.4 10*3/uL (ref 1.7–7.7)
Neutrophils Relative %: 71 %
Platelet Count: 77 10*3/uL — ABNORMAL LOW (ref 150–400)
RBC: 4.53 MIL/uL (ref 3.87–5.11)
RDW: 15.9 % — ABNORMAL HIGH (ref 11.5–15.5)
WBC Count: 6.1 10*3/uL (ref 4.0–10.5)
nRBC: 0 % (ref 0.0–0.2)

## 2023-07-05 LAB — CMP (CANCER CENTER ONLY)
ALT: 14 U/L (ref 0–44)
AST: 22 U/L (ref 15–41)
Albumin: 4.8 g/dL (ref 3.5–5.0)
Alkaline Phosphatase: 47 U/L (ref 38–126)
Anion gap: 10 (ref 5–15)
BUN: 19 mg/dL (ref 8–23)
CO2: 34 mmol/L — ABNORMAL HIGH (ref 22–32)
Calcium: 10.4 mg/dL — ABNORMAL HIGH (ref 8.9–10.3)
Chloride: 96 mmol/L — ABNORMAL LOW (ref 98–111)
Creatinine: 0.71 mg/dL (ref 0.44–1.00)
GFR, Estimated: >60 mL/min (ref >60)
Glucose, Bld: 156 mg/dL — ABNORMAL HIGH (ref 70–99)
Potassium: 3.5 mmol/L (ref 3.5–5.1)
Sodium: 140 mmol/L (ref 135–145)
Total Bilirubin: 1.1 mg/dL (ref <1.2)
Total Protein: 7.8 g/dL (ref 6.5–8.1)

## 2023-07-05 LAB — FERRITIN: Ferritin: 25 ng/mL (ref 11–307)

## 2023-07-05 LAB — SAVE SMEAR(SSMR), FOR PROVIDER SLIDE REVIEW

## 2023-07-05 NOTE — Telephone Encounter (Signed)
Patient called to see if she needed to come in for lab work prior to starting the Doptelet (which should be delivered today). Confirmed with Dr.Ennever, patient to come in today for CBC and then okay to start today. Patient aware and scheduled

## 2023-07-06 LAB — IRON AND IRON BINDING CAPACITY (CC-WL,HP ONLY)
Iron: 81 ug/dL (ref 28–170)
Saturation Ratios: 17 % (ref 10.4–31.8)
TIBC: 470 ug/dL — ABNORMAL HIGH (ref 250–450)
UIBC: 389 ug/dL (ref 148–442)

## 2023-07-13 ENCOUNTER — Other Ambulatory Visit: Payer: Self-pay

## 2023-07-13 DIAGNOSIS — D696 Thrombocytopenia, unspecified: Secondary | ICD-10-CM

## 2023-07-14 ENCOUNTER — Inpatient Hospital Stay: Payer: PPO

## 2023-07-14 ENCOUNTER — Inpatient Hospital Stay (HOSPITAL_BASED_OUTPATIENT_CLINIC_OR_DEPARTMENT_OTHER): Payer: PPO | Admitting: Medical Oncology

## 2023-07-14 ENCOUNTER — Other Ambulatory Visit: Payer: Self-pay | Admitting: Hematology & Oncology

## 2023-07-14 VITALS — BP 144/59 | HR 92 | Temp 98.6°F | Resp 19 | Wt 185.1 lb

## 2023-07-14 DIAGNOSIS — M545 Low back pain, unspecified: Secondary | ICD-10-CM

## 2023-07-14 DIAGNOSIS — D696 Thrombocytopenia, unspecified: Secondary | ICD-10-CM | POA: Diagnosis not present

## 2023-07-14 DIAGNOSIS — D693 Immune thrombocytopenic purpura: Secondary | ICD-10-CM | POA: Diagnosis not present

## 2023-07-14 DIAGNOSIS — M25559 Pain in unspecified hip: Secondary | ICD-10-CM

## 2023-07-14 LAB — CBC WITH DIFFERENTIAL (CANCER CENTER ONLY)
Abs Immature Granulocytes: 0.08 10*3/uL — ABNORMAL HIGH (ref 0.00–0.07)
Basophils Absolute: 0 10*3/uL (ref 0.0–0.1)
Basophils Relative: 1 %
Eosinophils Absolute: 0.1 10*3/uL (ref 0.0–0.5)
Eosinophils Relative: 1 %
HCT: 39.4 % (ref 36.0–46.0)
Hemoglobin: 13.2 g/dL (ref 12.0–15.0)
Immature Granulocytes: 1 %
Lymphocytes Relative: 16 %
Lymphs Abs: 1.2 10*3/uL (ref 0.7–4.0)
MCH: 28.8 pg (ref 26.0–34.0)
MCHC: 33.5 g/dL (ref 30.0–36.0)
MCV: 85.8 fL (ref 80.0–100.0)
Monocytes Absolute: 0.6 10*3/uL (ref 0.1–1.0)
Monocytes Relative: 8 %
Neutro Abs: 5.6 10*3/uL (ref 1.7–7.7)
Neutrophils Relative %: 73 %
Platelet Count: 119 10*3/uL — ABNORMAL LOW (ref 150–400)
RBC: 4.59 MIL/uL (ref 3.87–5.11)
RDW: 15.9 % — ABNORMAL HIGH (ref 11.5–15.5)
WBC Count: 7.7 10*3/uL (ref 4.0–10.5)
nRBC: 0 % (ref 0.0–0.2)

## 2023-07-14 LAB — CMP (CANCER CENTER ONLY)
ALT: 15 U/L (ref 0–44)
AST: 25 U/L (ref 15–41)
Albumin: 4.9 g/dL (ref 3.5–5.0)
Alkaline Phosphatase: 52 U/L (ref 38–126)
Anion gap: 9 (ref 5–15)
BUN: 18 mg/dL (ref 8–23)
CO2: 36 mmol/L — ABNORMAL HIGH (ref 22–32)
Calcium: 10.5 mg/dL — ABNORMAL HIGH (ref 8.9–10.3)
Chloride: 94 mmol/L — ABNORMAL LOW (ref 98–111)
Creatinine: 0.75 mg/dL (ref 0.44–1.00)
GFR, Estimated: >60 mL/min (ref >60)
Glucose, Bld: 152 mg/dL — ABNORMAL HIGH (ref 70–99)
Potassium: 3.5 mmol/L (ref 3.5–5.1)
Sodium: 139 mmol/L (ref 135–145)
Total Bilirubin: 1 mg/dL (ref <1.2)
Total Protein: 8 g/dL (ref 6.5–8.1)

## 2023-07-14 LAB — FERRITIN: Ferritin: 38 ng/mL (ref 11–307)

## 2023-07-14 LAB — LACTATE DEHYDROGENASE: LDH: 183 U/L (ref 98–192)

## 2023-07-14 NOTE — Progress Notes (Signed)
Hematology and Oncology Follow Up Visit  Rachael Buck [id number] [birth year] 82 y.o. 07/14/2023   Principle Diagnosis:  Thrombocytopenia -- NASH/Splenomegaly ; Immune based thrombocytopenia Iron deficiency    Current Therapy:        Nplate as indicated  IV iron as indicated  -- Feraheme given on -07/30/2022   Doptelet 40 mg po q day -- start on 06/23/2023 for dental extraction   Interim History:  Rachael Buck is here today for follow-up.  Current on Doptelet (40 mg p.o. daily) . She is tolerating this well without side effects. She is hopeful that this will increase her platelet level so that she can get her teeth removed.   There has been no bleeding to her knowledge: denies epistaxis, gingivitis, hemoptysis, hematemesis, hematuria, melena, excessive bruising, blood donation.   Chronic NASH.  No fever.  There is been no bleeding.  Is been no change in bowel or bladder habits.  Overall, I would say that her performance status is probably ECOG 2.   Wt Readings from Last 3 Encounters:  07/14/23 185 lb 1.9 oz (84 kg)  06/17/23 186 lb 1.3 oz (84.4 kg)  05/19/23 186 lb (84.4 kg)     Medications:  Allergies as of 07/14/2023       Reactions   Codeine Nausea And Vomiting   Metformin And Related Diarrhea   Shrimp [shellfish Allergy] Swelling        Medication List        Accurate as of July 14, 2023  2:54 PM. If you have any questions, ask your nurse or doctor.          acetaminophen 500 MG tablet Commonly known as: TYLENOL Take 500 mg by mouth every 6 (six) hours as needed for mild pain.   albuterol 108 (90 Base) MCG/ACT inhaler Commonly known as: ProAir HFA Inhale 2 puffs into the lungs every 4 (four) hours as needed for wheezing or shortness of breath.   ALPRAZolam 0.5 MG tablet Commonly known as: XANAX TAKE 1 TABLET(0.5 MG) BY MOUTH THREE TIMES DAILY   bismuth subsalicylate 262 MG/15ML suspension Commonly known as: PEPTO BISMOL Take 30 mLs by  mouth every 4 (four) hours as needed.   blood glucose meter kit and supplies Kit Dispense based on patient and insurance preference. Use up to four times daily as directed.   colestipol 1 g tablet Commonly known as: COLESTID 1 g daily.   dapagliflozin propanediol 5 MG Tabs tablet Commonly known as: Farxiga Take 1 tablet (5 mg total) by mouth daily before breakfast.   Doptelet 20 MG tablet Generic drug: avatrombopag maleate Take 2 tablets (40 mg total) by mouth daily. Take with food.   glipiZIDE 5 MG 24 hr tablet Commonly known as: GLUCOTROL XL TAKE 1 TABLET(5 MG) BY MOUTH DAILY WITH BREAKFAST   hydrocortisone 2.5 % cream Apply 1 application  topically daily as needed (itchy dry skin).   ipratropium-albuterol 0.5-2.5 (3) MG/3ML Soln Commonly known as: DUONEB Use one 3 ml neb treatment as needed every 6-8 hours prn   loperamide 2 MG capsule Commonly known as: IMODIUM Take 4-6 mg by mouth daily as needed (for IBS symptoms).   losartan 50 MG tablet Commonly known as: COZAAR Take 1 tablet (50 mg total) by mouth daily.   OneTouch Delica Lancets Fine Misc 1 each by Other route daily.   OneTouch Ultra test strip Generic drug: glucose blood TEST BLOOD SUGAR(S) ONCE DAILY IN THE MORNING   potassium chloride 10 MEQ  tablet Commonly known as: KLOR-CON M Take 1 tablet (10 mEq total) by mouth daily.   rifaximin 550 MG Tabs tablet Commonly known as: Xifaxan Take 1 tablet (550 mg total) by mouth 3 (three) times daily. Take for 2 weeks   sertraline 100 MG tablet Commonly known as: ZOLOFT Take 1.5 tablets (150 mg total) by mouth daily.   simvastatin 80 MG tablet Commonly known as: ZOCOR TAKE 1 TABLET(80 MG) BY MOUTH DAILY   Stiolto Respimat 2.5-2.5 MCG/ACT Aers Generic drug: Tiotropium Bromide-Olodaterol Inhale 2 puffs into the lungs daily.   sucralfate 1 g tablet Commonly known as: CARAFATE Take 1 g by mouth 4 (four) times daily as needed.   traMADol 50 MG  tablet Commonly known as: ULTRAM TAKE 1 TABLET(50 MG) BY MOUTH EVERY 6 HOURS AS NEEDED        Allergies:  Allergies  Allergen Reactions   Codeine Nausea And Vomiting   Metformin And Related Diarrhea   Shrimp [Shellfish Allergy] Swelling    Past Medical History, Surgical history, Social history, and Family History were reviewed and updated.  Review of Systems: Review of Systems  Constitutional:  Negative for malaise/fatigue.  HENT: Negative.    Eyes: Negative.   Respiratory: Negative.    Cardiovascular: Negative.   Gastrointestinal:  Negative for abdominal pain.  Genitourinary: Negative.   Musculoskeletal: Negative.   Skin: Negative.   Neurological: Negative.   Endo/Heme/Allergies: Negative.   Psychiatric/Behavioral: Negative.     Physical Exam:  weight is 185 lb 1.9 oz (84 kg). Her temperature is 98.6 F (37 C). Her blood pressure is 144/59 (abnormal) and her pulse is 92. Her respiration is 19 and oxygen saturation is 96%.   Wt Readings from Last 3 Encounters:  07/14/23 185 lb 1.9 oz (84 kg)  06/17/23 186 lb 1.3 oz (84.4 kg)  05/19/23 186 lb (84.4 kg)    Physical Exam Vitals reviewed.  HENT:     Head: Normocephalic and atraumatic.  Eyes:     Pupils: Pupils are equal, round, and reactive to light.  Cardiovascular:     Rate and Rhythm: Normal rate and regular rhythm.     Heart sounds: Normal heart sounds.  Pulmonary:     Effort: Pulmonary effort is normal.     Breath sounds: Normal breath sounds.  Abdominal:     General: Bowel sounds are normal.     Palpations: Abdomen is soft.  Musculoskeletal:        General: No tenderness or deformity. Normal range of motion.     Cervical back: Normal range of motion.  Lymphadenopathy:     Cervical: No cervical adenopathy.  Skin:    General: Skin is warm and dry.     Findings: No erythema or rash.  Neurological:     Mental Status: She is alert and oriented to person, place, and time.  Psychiatric:        Behavior:  Behavior normal.        Thought Content: Thought content normal.        Judgment: Judgment normal.      Lab Results  Component Value Date   WBC 7.7 07/14/2023   HGB 13.2 07/14/2023   HCT 39.4 07/14/2023   MCV 85.8 07/14/2023   PLT 119 (L) 07/14/2023   Lab Results  Component Value Date   FERRITIN 25 07/05/2023   IRON 81 07/05/2023   TIBC 470 (H) 07/05/2023   UIBC 389 07/05/2023   IRONPCTSAT 17 07/05/2023   Lab Results  Component Value Date   RETICCTPCT 1.9 06/17/2023   RBC 4.59 07/14/2023   No results found for: "KPAFRELGTCHN", "LAMBDASER", "KAPLAMBRATIO" No results found for: "IGGSERUM", "IGA", "IGMSERUM" No results found for: "TOTALPROTELP", "ALBUMINELP", "A1GS", "A2GS", "BETS", "BETA2SER", "GAMS", "MSPIKE", "SPEI"   Chemistry      Component Value Date/Time   NA 140 07/05/2023 1350   NA 127 (L) 02/14/2017 1335   K 3.5 07/05/2023 1350   K 3.6 02/14/2017 1335   CL 96 (L) 07/05/2023 1350   CL 92 (L) 02/14/2017 1335   CO2 34 (H) 07/05/2023 1350   CO2 28 02/14/2017 1335   BUN 19 07/05/2023 1350   BUN 7 (L) 02/14/2017 1335   CREATININE 0.71 07/05/2023 1350   CREATININE 0.47 (L) 02/14/2017 1335   CREATININE 0.56 07/24/2014 1204      Component Value Date/Time   CALCIUM 10.4 (H) 07/05/2023 1350   CALCIUM 9.3 02/14/2017 1335   ALKPHOS 47 07/05/2023 1350   ALKPHOS 68 02/14/2017 1335   AST 22 07/05/2023 1350   ALT 14 07/05/2023 1350   BILITOT 1.1 07/05/2023 1350      Impression and Plan: Ms. 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  She is doing great on Doptelet. She is now able to have her dental extractions. She is very excited about this.  RTC 1 month MD, labs, +- Nplate   Rushie Chestnut, PA-C 11/14/20242:54 PM

## 2023-07-18 ENCOUNTER — Other Ambulatory Visit: Payer: Self-pay | Admitting: Medical Oncology

## 2023-08-05 ENCOUNTER — Other Ambulatory Visit: Payer: Self-pay | Admitting: Emergency Medicine

## 2023-08-10 ENCOUNTER — Encounter: Payer: Self-pay | Admitting: Hematology & Oncology

## 2023-08-16 ENCOUNTER — Telehealth: Payer: Self-pay | Admitting: Emergency Medicine

## 2023-08-16 NOTE — Telephone Encounter (Signed)
Patient has been scheduled for next available with Dr.Byrum (February 2025). Stileto Respamat  refills.  [facility] at [address] and [address]

## 2023-08-17 ENCOUNTER — Other Ambulatory Visit: Payer: Self-pay | Admitting: Emergency Medicine

## 2023-08-17 ENCOUNTER — Encounter: Payer: Self-pay | Admitting: Family Medicine

## 2023-08-17 ENCOUNTER — Other Ambulatory Visit: Payer: Self-pay | Admitting: Family Medicine

## 2023-08-17 DIAGNOSIS — F419 Anxiety disorder, unspecified: Secondary | ICD-10-CM

## 2023-08-17 DIAGNOSIS — E782 Mixed hyperlipidemia: Secondary | ICD-10-CM

## 2023-08-18 ENCOUNTER — Inpatient Hospital Stay: Payer: PPO | Admitting: Hematology & Oncology

## 2023-08-18 ENCOUNTER — Inpatient Hospital Stay: Payer: PPO

## 2023-08-18 ENCOUNTER — Encounter: Payer: Self-pay | Admitting: Hematology & Oncology

## 2023-08-18 ENCOUNTER — Inpatient Hospital Stay: Payer: PPO | Attending: Family

## 2023-08-18 VITALS — BP 131/73 | HR 86 | Temp 98.5°F | Resp 20 | Ht 66.0 in | Wt 180.0 lb

## 2023-08-18 DIAGNOSIS — D699 Hemorrhagic condition, unspecified: Secondary | ICD-10-CM | POA: Diagnosis not present

## 2023-08-18 DIAGNOSIS — D696 Thrombocytopenia, unspecified: Secondary | ICD-10-CM | POA: Diagnosis not present

## 2023-08-18 DIAGNOSIS — Z79899 Other long term (current) drug therapy: Secondary | ICD-10-CM | POA: Diagnosis not present

## 2023-08-18 DIAGNOSIS — D693 Immune thrombocytopenic purpura: Secondary | ICD-10-CM | POA: Insufficient documentation

## 2023-08-18 DIAGNOSIS — R161 Splenomegaly, not elsewhere classified: Secondary | ICD-10-CM | POA: Insufficient documentation

## 2023-08-18 DIAGNOSIS — K7581 Nonalcoholic steatohepatitis (NASH): Secondary | ICD-10-CM | POA: Diagnosis not present

## 2023-08-18 DIAGNOSIS — Z7984 Long term (current) use of oral hypoglycemic drugs: Secondary | ICD-10-CM | POA: Insufficient documentation

## 2023-08-18 DIAGNOSIS — D509 Iron deficiency anemia, unspecified: Secondary | ICD-10-CM | POA: Insufficient documentation

## 2023-08-18 LAB — CBC WITH DIFFERENTIAL (CANCER CENTER ONLY)
Abs Immature Granulocytes: 0.16 10*3/uL — ABNORMAL HIGH (ref 0.00–0.07)
Basophils Absolute: 0 10*3/uL (ref 0.0–0.1)
Basophils Relative: 1 %
Eosinophils Absolute: 0 10*3/uL (ref 0.0–0.5)
Eosinophils Relative: 1 %
HCT: 34.6 % — ABNORMAL LOW (ref 36.0–46.0)
Hemoglobin: 12.1 g/dL (ref 12.0–15.0)
Immature Granulocytes: 2 %
Lymphocytes Relative: 20 %
Lymphs Abs: 1.7 10*3/uL (ref 0.7–4.0)
MCH: 28.9 pg (ref 26.0–34.0)
MCHC: 35 g/dL (ref 30.0–36.0)
MCV: 82.6 fL (ref 80.0–100.0)
Monocytes Absolute: 0.7 10*3/uL (ref 0.1–1.0)
Monocytes Relative: 9 %
Neutro Abs: 5.6 10*3/uL (ref 1.7–7.7)
Neutrophils Relative %: 67 %
Platelet Count: 89 10*3/uL — ABNORMAL LOW (ref 150–400)
RBC: 4.19 MIL/uL (ref 3.87–5.11)
RDW: 16.5 % — ABNORMAL HIGH (ref 11.5–15.5)
WBC Count: 8.2 10*3/uL (ref 4.0–10.5)
nRBC: 0 % (ref 0.0–0.2)

## 2023-08-18 LAB — LACTATE DEHYDROGENASE: LDH: 157 U/L (ref 98–192)

## 2023-08-18 LAB — CMP (CANCER CENTER ONLY)
ALT: 11 U/L (ref 0–44)
AST: 24 U/L (ref 15–41)
Albumin: 4.5 g/dL (ref 3.5–5.0)
Alkaline Phosphatase: 48 U/L (ref 38–126)
Anion gap: 12 (ref 5–15)
BUN: 16 mg/dL (ref 8–23)
CO2: 30 mmol/L (ref 22–32)
Calcium: 9.5 mg/dL (ref 8.9–10.3)
Chloride: 95 mmol/L — ABNORMAL LOW (ref 98–111)
Creatinine: 0.76 mg/dL (ref 0.44–1.00)
GFR, Estimated: >60 mL/min (ref >60)
Glucose, Bld: 149 mg/dL — ABNORMAL HIGH (ref 70–99)
Potassium: 3.1 mmol/L — ABNORMAL LOW (ref 3.5–5.1)
Sodium: 137 mmol/L (ref 135–145)
Total Bilirubin: 1 mg/dL (ref <1.2)
Total Protein: 7.7 g/dL (ref 6.5–8.1)

## 2023-08-18 LAB — FERRITIN: Ferritin: 74 ng/mL (ref 11–307)

## 2023-08-18 MED ORDER — ROMIPLOSTIM 125 MCG ~~LOC~~ SOLR
85.0000 ug | Freq: Once | SUBCUTANEOUS | Status: AC
  Administered 2023-08-18: 85 ug via SUBCUTANEOUS
  Filled 2023-08-18: qty 0.17

## 2023-08-18 NOTE — Patient Instructions (Signed)
Romiplostim Injection What is this medication? ROMIPLOSTIM (roe mi PLOE stim) treats low levels of platelets in your body caused by immune thrombocytopenia (ITP). It is prescribed when other medications have not worked or cannot be tolerated. It may also be used to help people who have been exposed to high doses of radiation. It works by increasing the amount of platelets in your blood. This lowers the risk of bleeding. This medicine may be used for other purposes; ask your health care provider or pharmacist if you have questions. COMMON BRAND NAME(S): Nplate What should I tell my care team before I take this medication? They need to know if you have any of these conditions: Blood clots Myelodysplastic syndrome An unusual or allergic reaction to romiplostim, mannitol, other medications, foods, dyes, or preservatives Pregnant or trying to get pregnant Breast-feeding How should I use this medication? This medication is injected under the skin. It is given by a care team in a hospital or clinic setting. A special MedGuide will be given to you before each treatment. Be sure to read this information carefully each time. Talk to your care team about the use of this medication in children. While it may be prescribed for children as young as newborns for selected conditions, precautions do apply. Overdosage: If you think you have taken too much of this medicine contact a poison control center or emergency room at once. NOTE: This medicine is only for you. Do not share this medicine with others. What if I miss a dose? Keep appointments for follow-up doses. It is important not to miss your dose. Call your care team if you are unable to keep an appointment. What may interact with this medication? Interactions are not expected. This list may not describe all possible interactions. Give your health care provider a list of all the medicines, herbs, non-prescription drugs, or dietary supplements you use. Also  tell them if you smoke, drink alcohol, or use illegal drugs. Some items may interact with your medicine. What should I watch for while using this medication? Visit your care team for regular checks on your progress. You may need blood work done while you are taking this medication. Your condition will be monitored carefully while you are receiving this medication. It is important not to miss any appointments. What side effects may I notice from receiving this medication? Side effects that you should report to your care team as soon as possible: Allergic reactions--skin rash, itching, hives, swelling of the face, lips, tongue, or throat Blood clot--pain, swelling, or warmth in the leg, shortness of breath, chest pain Side effects that usually do not require medical attention (report to your care team if they continue or are bothersome): Dizziness Joint pain Muscle pain Pain in the hands or feet Stomach pain Trouble sleeping This list may not describe all possible side effects. Call your doctor for medical advice about side effects. You may report side effects to FDA at 1-800-FDA-1088. Where should I keep my medication? This medication is given in a hospital or clinic. It will not be stored at home. NOTE: This sheet is a summary. It may not cover all possible information. If you have questions about this medicine, talk to your doctor, pharmacist, or health care provider.  2024 Elsevier/Gold Standard (2021-12-21 00:00:00)

## 2023-08-18 NOTE — Progress Notes (Signed)
Hematology and Oncology Follow Up Visit  Rachael Buck [id number] 03-22-1941 82 y.o. 08/18/2023   Principle Diagnosis:  Thrombocytopenia -- NASH/Splenomegaly ; Immune based thrombocytopenia Iron deficiency    Current Therapy:        Nplate as indicated --give for platelet count < 75K IV iron as indicated  -- Feraheme given on -07/30/2022   Doptelet 40 mg po q day -- start on 06/23/2023 for dental extraction -now DC'd on 08/15/2023   Interim History:  Rachael Buck is here today for follow-up.  She underwent total dental extractions.  She has temporary dentures right now.  We got her through the procedure without any bleeding.  She did have some bruising.  I am not surprised by this.  It is amazing how thorough and how meticulous the oral surgeon was.  She really looks quite good.  Otherwise, things are going okay for her.  I know this is can be a difficult holiday season just because her husband is no longer with [place].  She has had no fever.  She has had no nausea or vomiting.  She has had no cough or shortness of breath.  She has had no leg swelling.  There has been no problems with bowels or bladder.  Overall, I would say that her performance status is ECOG 2.   .   Wt Readings from Last 3 Encounters:  08/18/23 180 lb (81.6 kg)  07/14/23 185 lb 1.9 oz (84 kg)  06/17/23 186 lb 1.3 oz (84.4 kg)     Medications:  Allergies as of 08/18/2023       Reactions   Codeine Nausea And Vomiting   Metformin And Related Diarrhea   Shrimp [shellfish Allergy] Swelling        Medication List        Accurate as of August 18, 2023  1:49 PM. If you have any questions, ask your nurse or doctor.          acetaminophen 500 MG tablet Commonly known as: TYLENOL Take 500 mg by mouth every 6 (six) hours as needed for mild pain.   albuterol 108 (90 Base) MCG/ACT inhaler Commonly known as: ProAir HFA Inhale 2 puffs into the lungs every 4 (four) hours as needed for wheezing or  shortness of breath.   ALPRAZolam 0.5 MG tablet Commonly known as: XANAX TAKE 1 TABLET(0.5 MG) BY MOUTH THREE TIMES DAILY- please see me for further refills   amoxicillin 500 MG capsule Commonly known as: AMOXIL Take 500 mg by mouth 3 (three) times daily.   bismuth subsalicylate 262 MG/15ML suspension Commonly known as: PEPTO BISMOL Take 30 mLs by mouth every 4 (four) hours as needed.   blood glucose meter kit and supplies Kit Dispense based on patient and insurance preference. Use up to four times daily as directed.   colestipol 1 g tablet Commonly known as: COLESTID 1 g daily.   dapagliflozin propanediol 5 MG Tabs tablet Commonly known as: Farxiga Take 1 tablet (5 mg total) by mouth daily before breakfast.   Doptelet 20 MG tablet Generic drug: avatrombopag maleate Take 2 tablets (40 mg total) by mouth daily. Take with food.   glipiZIDE 5 MG 24 hr tablet Commonly known as: GLUCOTROL XL TAKE 1 TABLET(5 MG) BY MOUTH DAILY WITH BREAKFAST   hydrocortisone 2.5 % cream Apply 1 application  topically daily as needed (itchy dry skin).   ipratropium-albuterol 0.5-2.5 (3) MG/3ML Soln Commonly known as: DUONEB Use one 3 ml neb treatment as needed  every 6-8 hours prn   loperamide 2 MG capsule Commonly known as: IMODIUM Take 4-6 mg by mouth daily as needed (for IBS symptoms).   losartan 50 MG tablet Commonly known as: COZAAR Take 1 tablet (50 mg total) by mouth daily.   OneTouch Delica Lancets Fine Misc 1 each by Other route daily.   OneTouch Ultra test strip Generic drug: glucose blood TEST BLOOD SUGAR(S) ONCE DAILY IN THE MORNING   potassium chloride 10 MEQ tablet Commonly known as: KLOR-CON M Take 1 tablet (10 mEq total) by mouth daily.   rifaximin 550 MG Tabs tablet Commonly known as: Xifaxan Take 1 tablet (550 mg total) by mouth 3 (three) times daily. Take for 2 weeks   sertraline 100 MG tablet Commonly known as: ZOLOFT Take 1.5 tablets (150 mg total) by  mouth daily.   simvastatin 80 MG tablet Commonly known as: ZOCOR TAKE 1 TABLET(80 MG) BY MOUTH DAILY   Stiolto Respimat 2.5-2.5 MCG/ACT Aers Generic drug: Tiotropium Bromide-Olodaterol INHALE 2 PUFFS INTO THE LUNGS DAILY   sucralfate 1 g tablet Commonly known as: CARAFATE Take 1 g by mouth 4 (four) times daily as needed.   traMADol 50 MG tablet Commonly known as: ULTRAM TAKE 1 TABLET(50 MG) BY MOUTH EVERY 6 HOURS AS NEEDED        Allergies:  Allergies  Allergen Reactions   Codeine Nausea And Vomiting   Metformin And Related Diarrhea   Shrimp [Shellfish Allergy] Swelling    Past Medical History, Surgical history, Social history, and Family History were reviewed and updated.  Review of Systems: Review of Systems  Constitutional:  Negative for malaise/fatigue.  HENT: Negative.    Eyes: Negative.   Respiratory: Negative.    Cardiovascular: Negative.   Gastrointestinal:  Negative for abdominal pain.  Genitourinary: Negative.   Musculoskeletal: Negative.   Skin: Negative.   Neurological: Negative.   Endo/Heme/Allergies: Negative.   Psychiatric/Behavioral: Negative.     Physical Exam:  height is 5\' 6"  (1.676 m) and weight is 180 lb (81.6 kg). Her oral temperature is 98.5 F (36.9 C). Her blood pressure is 131/73 and her pulse is 86. Her respiration is 20 and oxygen saturation is 94%.   Wt Readings from Last 3 Encounters:  08/18/23 180 lb (81.6 kg)  07/14/23 185 lb 1.9 oz (84 kg)  06/17/23 186 lb 1.3 oz (84.4 kg)    Physical Exam Vitals reviewed.  HENT:     Head: Normocephalic and atraumatic.  Eyes:     Pupils: Pupils are equal, round, and reactive to light.  Cardiovascular:     Rate and Rhythm: Normal rate and regular rhythm.     Heart sounds: Normal heart sounds.  Pulmonary:     Effort: Pulmonary effort is normal.     Breath sounds: Normal breath sounds.  Abdominal:     General: Bowel sounds are normal.     Palpations: Abdomen is soft.   Musculoskeletal:        General: No tenderness or deformity. Normal range of motion.     Cervical back: Normal range of motion.  Lymphadenopathy:     Cervical: No cervical adenopathy.  Skin:    General: Skin is warm and dry.     Findings: No erythema or rash.  Neurological:     Mental Status: She is alert and oriented to person, place, and time.  Psychiatric:        Behavior: Behavior normal.        Thought Content: Thought content  normal.        Judgment: Judgment normal.      Lab Results  Component Value Date   WBC 8.2 08/18/2023   HGB 12.1 08/18/2023   HCT 34.6 (L) 08/18/2023   MCV 82.6 08/18/2023   PLT 89 (L) 08/18/2023   Lab Results  Component Value Date   FERRITIN 38 07/14/2023   IRON 81 07/05/2023   TIBC 470 (H) 07/05/2023   UIBC 389 07/05/2023   IRONPCTSAT 17 07/05/2023   Lab Results  Component Value Date   RETICCTPCT 1.9 06/17/2023   RBC 4.19 08/18/2023   No results found for: "KPAFRELGTCHN", "LAMBDASER", "KAPLAMBRATIO" No results found for: "IGGSERUM", "IGA", "IGMSERUM" No results found for: "TOTALPROTELP", "ALBUMINELP", "A1GS", "A2GS", "BETS", "BETA2SER", "GAMS", "MSPIKE", "SPEI"   Chemistry      Component Value Date/Time   NA 137 08/18/2023 1217   NA 127 (L) 02/14/2017 1335   K 3.1 (L) 08/18/2023 1217   K 3.6 02/14/2017 1335   CL 95 (L) 08/18/2023 1217   CL 92 (L) 02/14/2017 1335   CO2 30 08/18/2023 1217   CO2 28 02/14/2017 1335   BUN 16 08/18/2023 1217   BUN 7 (L) 02/14/2017 1335   CREATININE 0.76 08/18/2023 1217   CREATININE 0.47 (L) 02/14/2017 1335   CREATININE 0.56 07/24/2014 1204      Component Value Date/Time   CALCIUM 9.5 08/18/2023 1217   CALCIUM 9.3 02/14/2017 1335   ALKPHOS 48 08/18/2023 1217   ALKPHOS 68 02/14/2017 1335   AST 24 08/18/2023 1217   ALT 11 08/18/2023 1217   BILITOT 1.0 08/18/2023 1217      Impression and Plan: Ms. Pulsipher is a very pleasant 82 yo caucasian female with thrombocytopenia secondary to  splenomegaly with NASH.  We had her on Doptelet to get her through the oral surgery.  This worked very nicely.  She is now off Doptelet.  Will go ahead and give her nplate for right now.  At this point, we try to move her appointments out a little bit longer.  She will have the permanent dentures placed I think about 6 months.  We will plan to try to get her back to see [place] in about 2 months.   Josph Macho, MD 12/19/20241:49 PM

## 2023-08-19 ENCOUNTER — Encounter: Payer: Self-pay | Admitting: *Deleted

## 2023-08-19 MED ORDER — STIOLTO RESPIMAT 2.5-2.5 MCG/ACT IN AERS: 2.0000 | INHALATION_SPRAY | Freq: Every day | RESPIRATORY_TRACT | 1 refills | Status: AC

## 2023-08-19 NOTE — Telephone Encounter (Signed)
Refill sent  Mychart msg to pt informing her that this was done  Nothing further needed

## 2023-09-07 ENCOUNTER — Other Ambulatory Visit: Payer: Self-pay | Admitting: Family Medicine

## 2023-09-07 ENCOUNTER — Other Ambulatory Visit: Payer: Self-pay | Admitting: Hematology & Oncology

## 2023-09-07 DIAGNOSIS — M545 Low back pain, unspecified: Secondary | ICD-10-CM

## 2023-09-07 DIAGNOSIS — I10 Essential (primary) hypertension: Secondary | ICD-10-CM

## 2023-09-07 DIAGNOSIS — M25559 Pain in unspecified hip: Secondary | ICD-10-CM

## 2023-09-08 ENCOUNTER — Telehealth: Payer: Self-pay

## 2023-09-08 NOTE — Telephone Encounter (Signed)
 Received phone cal from patient stating she noticed small red dots on the back of her hands and the palm of her hands. Dr. Timmy aware and no new orders received. Pt declined to come in to have labs checked. Pt denies any gum bleeding, nose bleeds or bruising that increases in size. Pt states she just wanted to let Dr. Timmy know. Pt has an appointment in a few weeks to see MD and have labs checked. Pt educated that if she notices the small red dots on her whole body, bruises that increase in size, gum bleeding or nose bleeds to let the office know or go to an urgent care or ER to be evaluated immediately. Pt verbalized understanding and had no further questions.

## 2023-09-20 ENCOUNTER — Other Ambulatory Visit: Payer: Self-pay | Admitting: Family Medicine

## 2023-09-20 ENCOUNTER — Encounter: Payer: Self-pay | Admitting: Family Medicine

## 2023-09-20 DIAGNOSIS — F419 Anxiety disorder, unspecified: Secondary | ICD-10-CM

## 2023-09-21 ENCOUNTER — Other Ambulatory Visit: Payer: Self-pay | Admitting: Family Medicine

## 2023-09-21 NOTE — Telephone Encounter (Signed)
Copied from CRM [id number]. Topic: Clinical - Medication Refill >> Sep 21, 2023  4:48 PM Ernst Spell wrote: Most Recent Primary Care Visit:  Provider: Clayborne Dana  Department: [facility]  Visit Type: OFFICE VISIT  Date: 03/09/2023  Medication: Marcelline Deist   Is this the correct pharmacy for this prescription? Yes If no, delete pharmacy and type the correct one.  [facility]  Fax#: [phone]  Has the prescription been filled recently? No  Is the patient out of the medication? No  Has the patient been seen for an appointment in the last year OR does the patient have an upcoming appointment? Yes  Can we respond through MyChart? No  Agent: Please be advised that Rx refills may take up to 3 business days. We ask that you follow-up with your pharmacy.

## 2023-09-22 MED ORDER — DAPAGLIFLOZIN PROPANEDIOL 5 MG PO TABS: 5.0000 mg | ORAL_TABLET | Freq: Every day | ORAL | 4 refills | Status: DC

## 2023-09-23 ENCOUNTER — Other Ambulatory Visit: Payer: Self-pay

## 2023-09-23 DIAGNOSIS — E119 Type 2 diabetes mellitus without complications: Secondary | ICD-10-CM

## 2023-09-23 MED ORDER — DAPAGLIFLOZIN PROPANEDIOL 5 MG PO TABS: 5.0000 mg | ORAL_TABLET | Freq: Every day | ORAL | 3 refills | Status: DC

## 2023-10-06 ENCOUNTER — Inpatient Hospital Stay: Payer: PPO | Attending: Family

## 2023-10-06 ENCOUNTER — Inpatient Hospital Stay: Payer: PPO

## 2023-10-06 ENCOUNTER — Inpatient Hospital Stay: Payer: PPO | Admitting: Medical Oncology

## 2023-10-06 ENCOUNTER — Encounter: Payer: Self-pay | Admitting: Medical Oncology

## 2023-10-06 VITALS — BP 102/48 | HR 88 | Temp 98.4°F | Resp 18 | Ht 66.0 in | Wt 181.0 lb

## 2023-10-06 DIAGNOSIS — Z7984 Long term (current) use of oral hypoglycemic drugs: Secondary | ICD-10-CM | POA: Insufficient documentation

## 2023-10-06 DIAGNOSIS — R161 Splenomegaly, not elsewhere classified: Secondary | ICD-10-CM | POA: Diagnosis not present

## 2023-10-06 DIAGNOSIS — D500 Iron deficiency anemia secondary to blood loss (chronic): Secondary | ICD-10-CM

## 2023-10-06 DIAGNOSIS — K7581 Nonalcoholic steatohepatitis (NASH): Secondary | ICD-10-CM | POA: Insufficient documentation

## 2023-10-06 DIAGNOSIS — D696 Thrombocytopenia, unspecified: Secondary | ICD-10-CM

## 2023-10-06 DIAGNOSIS — Z79899 Other long term (current) drug therapy: Secondary | ICD-10-CM | POA: Insufficient documentation

## 2023-10-06 DIAGNOSIS — D693 Immune thrombocytopenic purpura: Secondary | ICD-10-CM | POA: Insufficient documentation

## 2023-10-06 DIAGNOSIS — D699 Hemorrhagic condition, unspecified: Secondary | ICD-10-CM

## 2023-10-06 LAB — CMP (CANCER CENTER ONLY)
ALT: 12 U/L (ref 0–44)
AST: 20 U/L (ref 15–41)
Albumin: 4.3 g/dL (ref 3.5–5.0)
Alkaline Phosphatase: 47 U/L (ref 38–126)
Anion gap: 10 (ref 5–15)
BUN: 20 mg/dL (ref 8–23)
CO2: 32 mmol/L (ref 22–32)
Calcium: 9.7 mg/dL (ref 8.9–10.3)
Chloride: 95 mmol/L — ABNORMAL LOW (ref 98–111)
Creatinine: 0.73 mg/dL (ref 0.44–1.00)
GFR, Estimated: >60 mL/min (ref >60)
Glucose, Bld: 212 mg/dL — ABNORMAL HIGH (ref 70–99)
Potassium: 3.4 mmol/L — ABNORMAL LOW (ref 3.5–5.1)
Sodium: 137 mmol/L (ref 135–145)
Total Bilirubin: 1 mg/dL (ref 0.0–1.2)
Total Protein: 7.4 g/dL (ref 6.5–8.1)

## 2023-10-06 LAB — CBC WITH DIFFERENTIAL (CANCER CENTER ONLY)
Abs Immature Granulocytes: 0.04 10*3/uL (ref 0.00–0.07)
Basophils Absolute: 0.1 10*3/uL (ref 0.0–0.1)
Basophils Relative: 1 %
Eosinophils Absolute: 0 10*3/uL (ref 0.0–0.5)
Eosinophils Relative: 1 %
HCT: 35.7 % — ABNORMAL LOW (ref 36.0–46.0)
Hemoglobin: 12.2 g/dL (ref 12.0–15.0)
Immature Granulocytes: 1 %
Lymphocytes Relative: 20 %
Lymphs Abs: 1.2 10*3/uL (ref 0.7–4.0)
MCH: 28.8 pg (ref 26.0–34.0)
MCHC: 34.2 g/dL (ref 30.0–36.0)
MCV: 84.4 fL (ref 80.0–100.0)
Monocytes Absolute: 0.7 10*3/uL (ref 0.1–1.0)
Monocytes Relative: 11 %
Neutro Abs: 4.1 10*3/uL (ref 1.7–7.7)
Neutrophils Relative %: 66 %
Platelet Count: 66 10*3/uL — ABNORMAL LOW (ref 150–400)
RBC: 4.23 MIL/uL (ref 3.87–5.11)
RDW: 16 % — ABNORMAL HIGH (ref 11.5–15.5)
WBC Count: 6.1 10*3/uL (ref 4.0–10.5)
nRBC: 0 % (ref 0.0–0.2)

## 2023-10-06 LAB — FERRITIN: Ferritin: 17 ng/mL (ref 11–307)

## 2023-10-06 LAB — LACTATE DEHYDROGENASE: LDH: 114 U/L (ref 98–192)

## 2023-10-06 LAB — SAVE SMEAR(SSMR), FOR PROVIDER SLIDE REVIEW

## 2023-10-06 MED ORDER — ROMIPLOSTIM 125 MCG ~~LOC~~ SOLR
85.0000 ug | SUBCUTANEOUS | Status: DC
  Administered 2023-10-06: 85 ug via SUBCUTANEOUS
  Filled 2023-10-06: qty 0.17

## 2023-10-06 NOTE — Patient Instructions (Signed)
 Romiplostim Injection What is this medication? ROMIPLOSTIM (roe mi PLOE stim) treats low levels of platelets in your body caused by immune thrombocytopenia (ITP). It is prescribed when other medications have not worked or cannot be tolerated. It may also be used to help people who have been exposed to high doses of radiation. It works by increasing the amount of platelets in your blood. This lowers the risk of bleeding. This medicine may be used for other purposes; ask your health care provider or pharmacist if you have questions. COMMON BRAND NAME(S): Nplate What should I tell my care team before I take this medication? They need to know if you have any of these conditions: Blood clots Myelodysplastic syndrome An unusual or allergic reaction to romiplostim, mannitol, other medications, foods, dyes, or preservatives Pregnant or trying to get pregnant Breast-feeding How should I use this medication? This medication is injected under the skin. It is given by a care team in a hospital or clinic setting. A special MedGuide will be given to you before each treatment. Be sure to read this information carefully each time. Talk to your care team about the use of this medication in children. While it may be prescribed for children as young as newborns for selected conditions, precautions do apply. Overdosage: If you think you have taken too much of this medicine contact a poison control center or emergency room at once. NOTE: This medicine is only for you. Do not share this medicine with others. What if I miss a dose? Keep appointments for follow-up doses. It is important not to miss your dose. Call your care team if you are unable to keep an appointment. What may interact with this medication? Interactions are not expected. This list may not describe all possible interactions. Give your health care provider a list of all the medicines, herbs, non-prescription drugs, or dietary supplements you use. Also  tell them if you smoke, drink alcohol, or use illegal drugs. Some items may interact with your medicine. What should I watch for while using this medication? Visit your care team for regular checks on your progress. You may need blood work done while you are taking this medication. Your condition will be monitored carefully while you are receiving this medication. It is important not to miss any appointments. What side effects may I notice from receiving this medication? Side effects that you should report to your care team as soon as possible: Allergic reactions--skin rash, itching, hives, swelling of the face, lips, tongue, or throat Blood clot--pain, swelling, or warmth in the leg, shortness of breath, chest pain Side effects that usually do not require medical attention (report to your care team if they continue or are bothersome): Dizziness Joint pain Muscle pain Pain in the hands or feet Stomach pain Trouble sleeping This list may not describe all possible side effects. Call your doctor for medical advice about side effects. You may report side effects to FDA at 1-800-FDA-1088. Where should I keep my medication? This medication is given in a hospital or clinic. It will not be stored at home. NOTE: This sheet is a summary. It may not cover all possible information. If you have questions about this medicine, talk to your doctor, pharmacist, or health care provider.  2024 Elsevier/Gold Standard (2021-12-21 00:00:00)

## 2023-10-06 NOTE — Progress Notes (Signed)
 Hematology and Oncology Follow Up Visit  Rachael Buck [id number] [birth year] 83 y.o. 10/06/2023   Principle Diagnosis:  Thrombocytopenia -- NASH/Splenomegaly ; Immune based thrombocytopenia Iron  deficiency   Previous Therapy:  Doptelet  40 mg po q day -- start on 06/23/2023 for dental extraction -now DC'd on 08/15/2023   Current Therapy:        Nplate  as indicated --give for platelet count < 75K IV iron  as indicated  -- Feraheme  given on -07/30/2022      Interim History:  Ms. Gitto is here today for follow-up.    She reports that she is doing well but is stressed over her daughter having to have an esophageal biopsy tomorrow.   She has healed well from her dental extractions.   There has been no bleeding to her knowledge: denies epistaxis, gingivitis, hemoptysis, hematemesis, hematuria, melena, excessive bruising, blood donation.   She has had no fever.  She has had no nausea or vomiting.  She has had no cough or shortness of breath.  She has had no leg swelling.  There has been no problems with bowels or bladder.  Overall, I would say that her performance status is ECOG 2.   .   Wt Readings from Last 3 Encounters:  10/06/23 181 lb (82.1 kg)  08/18/23 180 lb (81.6 kg)  07/14/23 185 lb 1.9 oz (84 kg)     Medications:  Allergies as of 10/06/2023       Reactions   Codeine  Nausea And Vomiting   Metformin  And Related Diarrhea   Shrimp [shellfish Allergy] Swelling        Medication List        Accurate as of October 06, 2023  3:05 PM. If you have any questions, ask your nurse or doctor.          STOP taking these medications    amoxicillin  500 MG capsule Commonly known as: AMOXIL  Stopped by: Lauraine CHRISTELLA Dais   Doptelet  20 MG tablet Generic drug: avatrombopag maleate  Stopped by: Lauraine CHRISTELLA Dais   potassium chloride  10 MEQ tablet Commonly known as: KLOR-CON  M Stopped by: Lauraine CHRISTELLA Dais   rifaximin  550 MG Tabs tablet Commonly known as:  Xifaxan  Stopped by: Lauraine CHRISTELLA Dais       TAKE these medications    acetaminophen  500 MG tablet Commonly known as: TYLENOL  Take 500 mg by mouth every 6 (six) hours as needed for mild pain.   albuterol  108 (90 Base) MCG/ACT inhaler Commonly known as: ProAir  HFA Inhale 2 puffs into the lungs every 4 (four) hours as needed for wheezing or shortness of breath.   ALPRAZolam  0.5 MG tablet Commonly known as: XANAX  TAKE 1 TABLET( 0.5 MG) BY MOUTH THREE TIMES DAILY   bismuth subsalicylate 262 MG/15ML suspension Commonly known as: PEPTO BISMOL Take 30 mLs by mouth every 4 (four) hours as needed.   blood glucose meter kit and supplies Kit Dispense based on patient and insurance preference. Use up to four times daily as directed.   colestipol  1 g tablet Commonly known as: COLESTID  1 g daily.   dapagliflozin  propanediol 5 MG Tabs tablet Commonly known as: Farxiga  Take 1 tablet (5 mg total) by mouth daily before breakfast.   glipiZIDE  5 MG 24 hr tablet Commonly known as: GLUCOTROL  XL TAKE 1 TABLET(5 MG) BY MOUTH DAILY WITH BREAKFAST   hydrochlorothiazide  25 MG tablet Commonly known as: HYDRODIURIL  Take 1 tablet (25 mg total) by mouth daily.   hydrocortisone  2.5 % cream Apply 1  application  topically daily as needed (itchy dry skin).   ipratropium-albuterol  0.5-2.5 (3) MG/3ML Soln Commonly known as: DUONEB Use one 3 ml neb treatment as needed every 6-8 hours prn   loperamide  2 MG capsule Commonly known as: IMODIUM  Take 4-6 mg by mouth daily as needed (for IBS symptoms).   losartan  50 MG tablet Commonly known as: COZAAR  Take 1 tablet (50 mg total) by mouth daily.   OneTouch Delica Lancets Fine Misc 1 each by Other route daily.   OneTouch Ultra test strip Generic drug: glucose blood TEST BLOOD SUGAR(S) ONCE DAILY IN THE MORNING   sertraline  100 MG tablet Commonly known as: ZOLOFT  Take 1.5 tablets (150 mg total) by mouth daily.   simvastatin  80 MG tablet Commonly  known as: ZOCOR  TAKE 1 TABLET(80 MG) BY MOUTH DAILY   Stiolto Respimat  2.5-2.5 MCG/ACT Aers Generic drug: Tiotropium Bromide-Olodaterol INHALE 2 PUFFS INTO THE LUNGS DAILY   Stiolto Respimat  2.5-2.5 MCG/ACT Aers Generic drug: Tiotropium Bromide-Olodaterol Inhale 2 puffs into the lungs daily.   sucralfate 1 g tablet Commonly known as: CARAFATE Take 1 g by mouth 4 (four) times daily as needed.   traMADol  50 MG tablet Commonly known as: ULTRAM  TAKE 1 TABLET(50 MG) BY MOUTH EVERY 6 HOURS AS NEEDED        Allergies:  Allergies  Allergen Reactions   Codeine  Nausea And Vomiting   Metformin  And Related Diarrhea   Shrimp [Shellfish Allergy] Swelling    Past Medical History, Surgical history, Social history, and Family History were reviewed and updated.  Review of Systems: Review of Systems  Constitutional:  Negative for malaise/fatigue.  HENT: Negative.    Eyes: Negative.   Respiratory: Negative.    Cardiovascular: Negative.   Gastrointestinal:  Negative for abdominal pain.  Genitourinary: Negative.   Musculoskeletal: Negative.   Skin: Negative.   Neurological: Negative.   Endo/Heme/Allergies: Negative.   Psychiatric/Behavioral: Negative.     Physical Exam:  height is 5' 6 (1.676 m) and weight is 181 lb (82.1 kg). Her oral temperature is 98.4 F (36.9 C). Her blood pressure is 102/48 (abnormal) and her pulse is 88. Her respiration is 18 and oxygen  saturation is 98%.   Wt Readings from Last 3 Encounters:  10/06/23 181 lb (82.1 kg)  08/18/23 180 lb (81.6 kg)  07/14/23 185 lb 1.9 oz (84 kg)    Physical Exam Vitals reviewed.  HENT:     Head: Normocephalic and atraumatic.  Eyes:     Pupils: Pupils are equal, round, and reactive to light.  Cardiovascular:     Rate and Rhythm: Normal rate and regular rhythm.     Heart sounds: Normal heart sounds.  Pulmonary:     Effort: Pulmonary effort is normal.     Breath sounds: Normal breath sounds.  Abdominal:      General: Bowel sounds are normal.     Palpations: Abdomen is soft.  Musculoskeletal:        General: No tenderness or deformity. Normal range of motion.     Cervical back: Normal range of motion.  Lymphadenopathy:     Cervical: No cervical adenopathy.  Skin:    General: Skin is warm and dry.     Findings: No erythema or rash.  Neurological:     Mental Status: She is alert and oriented to person, place, and time.  Psychiatric:        Behavior: Behavior normal.        Thought Content: Thought content normal.  Judgment: Judgment normal.      Lab Results  Component Value Date   WBC 6.1 10/06/2023   HGB 12.2 10/06/2023   HCT 35.7 (L) 10/06/2023   MCV 84.4 10/06/2023   PLT 66 (L) 10/06/2023   Lab Results  Component Value Date   FERRITIN 74 08/18/2023   IRON  81 07/05/2023   TIBC 470 (H) 07/05/2023   UIBC 389 07/05/2023   IRONPCTSAT 17 07/05/2023   Lab Results  Component Value Date   RETICCTPCT 1.9 06/17/2023   RBC 4.23 10/06/2023   No results found for: KPAFRELGTCHN, LAMBDASER, KAPLAMBRATIO No results found for: IGGSERUM, IGA, IGMSERUM No results found for: STEPHANY CARLOTA BENSON MARKEL EARLA JOANNIE DOC VICK, SPEI   Chemistry      Component Value Date/Time   NA 137 10/06/2023 1412   NA 127 (L) 02/14/2017 1335   K 3.4 (L) 10/06/2023 1412   K 3.6 02/14/2017 1335   CL 95 (L) 10/06/2023 1412   CL 92 (L) 02/14/2017 1335   CO2 32 10/06/2023 1412   CO2 28 02/14/2017 1335   BUN 20 10/06/2023 1412   BUN 7 (L) 02/14/2017 1335   CREATININE 0.73 10/06/2023 1412   CREATININE 0.47 (L) 02/14/2017 1335   CREATININE 0.56 07/24/2014 1204      Component Value Date/Time   CALCIUM  9.7 10/06/2023 1412   CALCIUM  9.3 02/14/2017 1335   ALKPHOS 47 10/06/2023 1412   ALKPHOS 68 02/14/2017 1335   AST 20 10/06/2023 1412   ALT 12 10/06/2023 1412   BILITOT 1.0 10/06/2023 1412     Encounter Diagnoses  Name Primary?   Thrombocytopenia  (HCC) Yes   NASH (nonalcoholic steatohepatitis)    Iron  deficiency anemia due to chronic blood loss     Impression and Plan: Ms. 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 We now see her every 2 months for labs and consideration of Nplate  therapy.   Today platelets are 66. She will need her Nplate  today CMP stable. LDH improved Iron  studies pending- will replace if needed.   RTC 2 months MD/APP, labs (CBC w/, CMP, save smear, iron , ferritin, LDH), Injection    Lauraine HERO [place], [place] 2/6/20253:05 PM

## 2023-10-07 LAB — IRON AND IRON BINDING CAPACITY (CC-WL,HP ONLY)
Iron: 83 ug/dL (ref 28–170)
Saturation Ratios: 16 % (ref 10.4–31.8)
TIBC: 515 ug/dL — ABNORMAL HIGH (ref 250–450)
UIBC: 432 ug/dL (ref 148–442)

## 2023-10-10 ENCOUNTER — Other Ambulatory Visit: Payer: Self-pay | Admitting: Family Medicine

## 2023-10-10 DIAGNOSIS — I10 Essential (primary) hypertension: Secondary | ICD-10-CM

## 2023-10-17 DIAGNOSIS — H52203 Unspecified astigmatism, bilateral: Secondary | ICD-10-CM | POA: Diagnosis not present

## 2023-10-17 DIAGNOSIS — E119 Type 2 diabetes mellitus without complications: Secondary | ICD-10-CM | POA: Diagnosis not present

## 2023-10-17 LAB — HM DIABETES EYE EXAM

## 2023-10-21 ENCOUNTER — Ambulatory Visit: Payer: PPO | Admitting: Emergency Medicine

## 2023-10-24 ENCOUNTER — Other Ambulatory Visit: Payer: Self-pay | Admitting: Family Medicine

## 2023-10-24 DIAGNOSIS — I10 Essential (primary) hypertension: Secondary | ICD-10-CM

## 2023-10-24 DIAGNOSIS — F419 Anxiety disorder, unspecified: Secondary | ICD-10-CM

## 2023-10-24 NOTE — Telephone Encounter (Signed)
 Copied from CRM [id number]. Topic: Clinical - Medication Refill >> Oct 24, 2023 10:48 AM Alcus Dad wrote: Most Recent Primary Care Visit:  Provider: Clayborne Dana  Department: [facility]  Visit Type: OFFICE VISIT  Date: 03/09/2023  Medication: ALPRAZolam (XANAX) 0.5 MG tablet ALPRAZolam (XANAX) 0.5 MG tablet  Has the patient contacted their pharmacy? Yes (Agent: If no, request that the patient contact the pharmacy for the refill. If patient does not wish to contact the pharmacy document the reason why and proceed with request.) (Agent: If yes, when and what did the pharmacy advise?)  Is this the correct pharmacy for this prescription? Yes If no, delete pharmacy and type the correct one.  This is the patient's preferred pharmacy:  [id number] [place] Sp[place], [place] - [address] AT [facility] [address] & [address] [place] [address] Phone: [phone] Fax: [phone]  [facility] #[id number] [place] Otto, [place] - [address] AT [facility] OF [facility] & [address] [place] [address] Phone: [phone] Fax: [phone]  [facility] - [place], [place] - [address] [place] [address] Phone: [phone] Fax: [phone]  [place] - [facility] [address] [place] [address] Phone: [phone] Fax: [phone]  [facility] - [place], [place] - [address] [address] [place], [place] [address] [place] [address] Phone: [phone] Fax: [phone]  [facility] #[id number] [place], [place] - [address] AT [facility] OF [facility] [address] & [facility] [address] [place] [address] Phone: [phone] Fax: [phone]   Has the prescription been filled recently? No  Is the patient out of the medication? Yes  Has the patient been seen for an appointment in the last year OR does the patient have an upcoming appointment? Yes  Can  we respond through MyChart? Yes  Agent: Please be advised that Rx refills may take up to 3 business days. We ask that you follow-up with your pharmacy.

## 2023-10-25 MED ORDER — LOSARTAN POTASSIUM 50 MG PO TABS: 50.0000 mg | ORAL_TABLET | Freq: Every day | ORAL | 1 refills | Status: DC

## 2023-10-25 NOTE — Telephone Encounter (Signed)
 I called patient as she is overdue for a visit.  Scheduled an appointment next week

## 2023-10-29 NOTE — Progress Notes (Deleted)
 [facility] at [facility] [address] [place], [place] [address] [phone]  Date:  11/03/2023   Name:  Rachael Buck   DOB:  [birth year]   MRN:  [id number]  PCP:  Pearline Cables, MD    Chief Complaint: No chief complaint on file.   History of Present Illness:  Rachael Buck is a 83 y.o. very pleasant female patient who presents with the following:  Patient seen today for medication follow-up Most recent visit with myself was April of the December 01, 2023 Her husband Alfredo Bach died 01-20-2024they were married for 61 years  History of well-controlled diabetes, hypertension, IBS, Nash, cirrhosis, hyperlipidemia, thrombocytopenia  DR Myna Hidalgo is following her immune-based thrombocytopenia and iron deficiency  She has diagnosis of Nash/splenomegaly, and immune-based thrombocytopenia as well as iron deficiency She follows up regularly with hematology oncology here at [facility] Most recent visit was in February Principle Diagnosis:  Thrombocytopenia -- NASH/Splenomegaly ; Immune based thrombocytopenia Iron deficiency  Previous Therapy:  Doptelet 40 mg po q day -- start on 06/23/2023 for dental extraction -now DC'd on 08/15/2023 Current Therapy:        Nplate as indicated --give for platelet count < 75K IV iron as indicated  -- Feraheme given on -07/30/2022  -She was given a dose of Nplate for low platelets on 10/06/2023  Tetanus booster Colon cancer screening? 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   Thrombocytopenia (HCC) 02/15/2017   Varicose veins of left lower extremity with complications 2015/12/01   Age-related cognitive decline 06/02/2015   Fatigue 07/19/2014   Orthostatic hypotension 05/14/2013   Nail abnormality 09/04/2012   Bleeding disorder (HCC) 05/08/2012   Varices, 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   NASH (nonalcoholic steatohepatitis) 03/21/2012   HTN (hypertension) 12/21/2011   Depression 03/28/2011   IBS (irritable bowel syndrome) 03/23/2011   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/27/2010   DYSPNEA ON EXERTION 07/27/2010   Uncontrolled diabetes mellitus 07/24/2010   MIXED HYPERLIPIDEMIA 07/24/2010   Obesity, unspecified 07/24/2010   ALLERGIC RHINITIS CAUSE UNSPECIFIED 07/24/2010   REFLUX ESOPHAGITIS 07/24/2010   PAIN IN JOINT PELVIC REGION AND THIGH 07/24/2010    Past Medical History:  Diagnosis Date   Anxiety    Arthritis    Asthma    Cirrhosis, nonalcoholic (HCC)    Colon polyps    Diabetes mellitus    Diverticulitis    GERD (gastroesophageal reflux disease)    Hyperlipidemia    Hypertension    NASH (nonalcoholic steatohepatitis)    Obesity    Pneumonia     Past Surgical History:  Procedure Laterality Date   CHOLECYSTECTOMY  1981   ENDOVENOUS ABLATION SAPHENOUS VEIN W/ LASER Left 02/21/2018   endovenous laser ablation left greater saphenous vein by Josephina Gip MD    OOPHORECTOMY  1973   PARTIAL HYSTERECTOMY  1972   ROTATOR CUFF REPAIR     bilateral. 2006, 2008  TOTAL KNEE ARTHROPLASTY Left 02/18/2017   TOTAL KNEE ARTHROPLASTY Left 02/18/2017   Procedure: LEFT TOTAL KNEE ARTHROPLASTY;  Surgeon: Beverely Low, MD;  Location: [facility] OR;  Service: Orthopedics;  Laterality: Left;   VEIN SURGERY      Social History   Tobacco Use   Smoking status: Former    Current packs/day: 1.50    Average packs/day: 1.5 packs/day for 15.0 years (22.5  ttl pk-yrs)    Types: Cigarettes   Smokeless tobacco: Never   Tobacco comments:    Quit 2002.  Vaping Use   Vaping status: Never Used  Substance Use Topics   Alcohol use: No   Drug use: No    Family History  Problem Relation Age of Onset   Stomach cancer Mother    Diabetes Sister    Breast cancer Daughter 5   Breast cancer Maternal Aunt    Diabetes Brother    Cancer Other    GER disease Other    Obesity Other     Allergies  Allergen Reactions   Codeine Nausea And Vomiting   Metformin And Related Diarrhea   Shrimp [Shellfish Allergy] Swelling    Medication list has been reviewed and updated.  Current Outpatient Medications on File Prior to Visit  Medication Sig Dispense Refill   acetaminophen (TYLENOL) 500 MG tablet Take 500 mg by mouth every 6 (six) hours as needed for mild pain.     albuterol (PROAIR HFA) 108 (90 Base) MCG/ACT inhaler Inhale 2 puffs into the lungs every 4 (four) hours as needed for wheezing or shortness of breath. 1 Inhaler 6   ALPRAZolam (XANAX) 0.5 MG tablet TAKE 1 TABLET(0.5 MG) BY MOUTH THREE TIMES DAILY 90 tablet 0   bismuth subsalicylate (PEPTO BISMOL) 262 MG/15ML suspension Take 30 mLs by mouth every 4 (four) hours as needed.     blood glucose meter kit and supplies KIT Dispense based on patient and insurance preference. Use up to four times daily as directed. 1 each 0   colestipol (COLESTID) 1 g tablet 1 g daily.     dapagliflozin propanediol (FARXIGA) 5 MG TABS tablet Take 1 tablet (5 mg total) by mouth daily before breakfast. 90 tablet 3   glipiZIDE (GLUCOTROL XL) 5 MG 24 hr tablet TAKE 1 TABLET(5 MG) BY MOUTH DAILY WITH BREAKFAST 90 tablet 3   glucose blood (ONETOUCH ULTRA) test strip TEST BLOOD SUGAR(S) ONCE DAILY IN THE MORNING 100 strip 12   hydrochlorothiazide (HYDRODIURIL) 25 MG tablet Take 1 tablet (25 mg total) by mouth daily. 30 tablet 0   hydrocortisone 2.5 % cream Apply 1 application  topically daily as needed (itchy dry skin).      ipratropium-albuterol (DUONEB) 0.5-2.5 (3) MG/3ML SOLN Use one 3 ml neb treatment as needed every 6-8 hours prn 360 mL 1   loperamide (IMODIUM) 2 MG capsule Take 4-6 mg by mouth daily as needed (for IBS symptoms).     losartan (COZAAR) 50 MG tablet Take 1 tablet (50 mg total) by mouth daily. 90 tablet 1   ONETOUCH DELICA LANCETS FINE MISC 1 each by Other route daily. 100 each 12   sertraline (ZOLOFT) 100 MG tablet Take 1.5 tablets (150 mg total) by mouth daily. 135 tablet 2   simvastatin (ZOCOR) 80 MG tablet TAKE 1 TABLET(80 MG) BY MOUTH DAILY 90 tablet 0   STIOLTO RESPIMAT 2.5-2.5 MCG/ACT AERS INHALE 2 PUFFS INTO THE LUNGS DAILY 4 g 5   sucralfate (CARAFATE) 1 g tablet Take 1 g by  mouth 4 (four) times daily as needed.     Tiotropium Bromide-Olodaterol (STIOLTO RESPIMAT) 2.5-2.5 MCG/ACT AERS Inhale 2 puffs into the lungs daily. 4 g 1   traMADol (ULTRAM) 50 MG tablet TAKE 1 TABLET(50 MG) BY MOUTH EVERY 6 HOURS AS NEEDED 60 tablet 0   No current facility-administered medications on file prior to visit.    Review of Systems:  As per HPI- otherwise negative.   Physical Examination: There were no vitals filed for this visit. There were no vitals filed for this visit. There is no height or weight on file to calculate BMI. Ideal Body Weight:    GEN: no acute distress. HEENT: Atraumatic, Normocephalic.  Ears and Nose: No external deformity. CV: RRR, No M/G/R. No JVD. No thrill. No extra heart sounds. PULM: CTA B, no wheezes, crackles, rhonchi. No retractions. No resp. distress. No accessory muscle use. ABD: S, NT, ND, +BS. No rebound. No HSM. EXTR: No c/c/e PSYCH: Normally interactive. Conversant.    Assessment and Plan: ***  Signed Abbe Amsterdam, MD

## 2023-11-02 NOTE — Progress Notes (Deleted)
 [facility] [address] [place], [place] [address] [phone]  Date:  11/07/2023   Name:  Rachael Buck   DOB:  [birth year]   MRN:  [id number]  PCP:  Pearline Cables, MD    Chief Complaint: No chief complaint on file.   History of Present Illness:  Rachael Buck is a 83 y.o. 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  She has diagnosis of Nash/splenomegaly, and immune-based thrombocytopenia as well as iron deficiency She follows up regularly with hematology oncology here at [facility] Most recent visit was in February Principle Diagnosis:  Thrombocytopenia -- NASH/Splenomegaly ; Immune based thrombocytopenia Iron deficiency  Previous Therapy:  Doptelet 40 mg po q day -- start on 06/23/2023 for dental extraction -now DC'd on 08/15/2023 Current Therapy:        Nplate as indicated --give for platelet count < 75K IV iron as indicated  -- Feraheme given on -07/30/2022  -She was given a dose of Nplate for low platelets on 10/06/2023  Tetanus booster Colon cancer screening? 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   Thrombocytopenia (HCC) 02/15/2017   Varicose veins of left lower extremity with complications 12-16-15   Age-related cognitive decline 06/02/2015   Fatigue 07/19/2014   Orthostatic hypotension 05/14/2013   Nail abnormality 09/04/2012   Bleeding disorder (HCC) 05/08/2012   Varices, 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   NASH (nonalcoholic steatohepatitis) 03/21/2012   HTN (hypertension) 12/21/2011   Depression 03/28/2011   IBS (irritable bowel syndrome) 03/23/2011   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/27/2010   DYSPNEA ON EXERTION 07/27/2010   Uncontrolled diabetes mellitus 07/24/2010   MIXED HYPERLIPIDEMIA 07/24/2010   Obesity, unspecified 07/24/2010   ALLERGIC RHINITIS CAUSE UNSPECIFIED 07/24/2010   REFLUX ESOPHAGITIS 07/24/2010   PAIN IN JOINT PELVIC REGION AND THIGH 07/24/2010    Past Medical History:  Diagnosis Date   Anxiety    Arthritis    Asthma    Cirrhosis, nonalcoholic (HCC)    Colon polyps    Diabetes mellitus    Diverticulitis    GERD (gastroesophageal reflux disease)    Hyperlipidemia    Hypertension    NASH (nonalcoholic steatohepatitis)    Obesity    Pneumonia     Past Surgical History:  Procedure Laterality Date   CHOLECYSTECTOMY  1981   ENDOVENOUS ABLATION SAPHENOUS VEIN W/ LASER Left 02/21/2018   endovenous laser ablation left greater saphenous vein by Josephina Gip MD    OOPHORECTOMY  1973   PARTIAL HYSTERECTOMY  1972   ROTATOR CUFF REPAIR     bilateral. 2006, 2008  TOTAL KNEE ARTHROPLASTY Left 02/18/2017   TOTAL KNEE ARTHROPLASTY Left 02/18/2017   Procedure: LEFT TOTAL KNEE ARTHROPLASTY;  Surgeon: Beverely Low, MD;  Location: [facility] OR;  Service: Orthopedics;  Laterality: Left;   VEIN SURGERY      Social History   Tobacco Use   Smoking status: Former    Current packs/day: 1.50    Average packs/day: 1.5 packs/day for 15.0 years (22.5  ttl pk-yrs)    Types: Cigarettes   Smokeless tobacco: Never   Tobacco comments:    Quit 2002.  Vaping Use   Vaping status: Never Used  Substance Use Topics   Alcohol use: No   Drug use: No    Family History  Problem Relation Age of Onset   Stomach cancer Mother    Diabetes Sister    Breast cancer Daughter 38   Breast cancer Maternal Aunt    Diabetes Brother    Cancer Other    GER disease Other    Obesity Other     Allergies  Allergen Reactions   Codeine Nausea And Vomiting   Metformin And Related Diarrhea   Shrimp [Shellfish Allergy] Swelling    Medication list has been reviewed and updated.  Current Outpatient Medications on File Prior to Visit  Medication Sig Dispense Refill   acetaminophen (TYLENOL) 500 MG tablet Take 500 mg by mouth every 6 (six) hours as needed for mild pain.     albuterol (PROAIR HFA) 108 (90 Base) MCG/ACT inhaler Inhale 2 puffs into the lungs every 4 (four) hours as needed for wheezing or shortness of breath. 1 Inhaler 6   ALPRAZolam (XANAX) 0.5 MG tablet TAKE 1 TABLET(0.5 MG) BY MOUTH THREE TIMES DAILY 90 tablet 0   bismuth subsalicylate (PEPTO BISMOL) 262 MG/15ML suspension Take 30 mLs by mouth every 4 (four) hours as needed.     blood glucose meter kit and supplies KIT Dispense based on patient and insurance preference. Use up to four times daily as directed. 1 each 0   colestipol (COLESTID) 1 g tablet 1 g daily.     dapagliflozin propanediol (FARXIGA) 5 MG TABS tablet Take 1 tablet (5 mg total) by mouth daily before breakfast. 90 tablet 3   glipiZIDE (GLUCOTROL XL) 5 MG 24 hr tablet TAKE 1 TABLET(5 MG) BY MOUTH DAILY WITH BREAKFAST 90 tablet 3   glucose blood (ONETOUCH ULTRA) test strip TEST BLOOD SUGAR(S) ONCE DAILY IN THE MORNING 100 strip 12   hydrochlorothiazide (HYDRODIURIL) 25 MG tablet Take 1 tablet (25 mg total) by mouth daily. 30 tablet 0   hydrocortisone 2.5 % cream Apply 1 application  topically daily as needed (itchy dry skin).      ipratropium-albuterol (DUONEB) 0.5-2.5 (3) MG/3ML SOLN Use one 3 ml neb treatment as needed every 6-8 hours prn 360 mL 1   loperamide (IMODIUM) 2 MG capsule Take 4-6 mg by mouth daily as needed (for IBS symptoms).     losartan (COZAAR) 50 MG tablet Take 1 tablet (50 mg total) by mouth daily. 90 tablet 1   ONETOUCH DELICA LANCETS FINE MISC 1 each by Other route daily. 100 each 12   sertraline (ZOLOFT) 100 MG tablet Take 1.5 tablets (150 mg total) by mouth daily. 135 tablet 2   simvastatin (ZOCOR) 80 MG tablet TAKE 1 TABLET(80 MG) BY MOUTH DAILY 90 tablet 0   STIOLTO RESPIMAT 2.5-2.5 MCG/ACT AERS INHALE 2 PUFFS INTO THE LUNGS DAILY 4 g 5   sucralfate (CARAFATE) 1 g tablet Take 1 g by  mouth 4 (four) times daily as needed.     Tiotropium Bromide-Olodaterol (STIOLTO RESPIMAT) 2.5-2.5 MCG/ACT AERS Inhale 2 puffs into the lungs daily. 4 g 1   traMADol (ULTRAM) 50 MG tablet TAKE 1 TABLET(50 MG) BY MOUTH EVERY 6 HOURS AS NEEDED 60 tablet 0   No current facility-administered medications on file prior to visit.    Review of Systems:  As per HPI- otherwise negative.   Physical Examination: There were no vitals filed for this visit. There were no vitals filed for this visit. There is no height or weight on file to calculate BMI. Ideal Body Weight:    GEN: no acute distress. HEENT: Atraumatic, Normocephalic.  Ears and Nose: No external deformity. CV: RRR, No M/G/R. No JVD. No thrill. No extra heart sounds. PULM: CTA B, no wheezes, crackles, rhonchi. No retractions. No resp. distress. No accessory muscle use. ABD: S, NT, ND, +BS. No rebound. No HSM. EXTR: No c/c/e PSYCH: Normally interactive. Conversant.    Assessment and Plan: ***  Signed Abbe Amsterdam, MD

## 2023-11-03 ENCOUNTER — Ambulatory Visit: Payer: PPO | Admitting: Family Medicine

## 2023-11-03 DIAGNOSIS — E119 Type 2 diabetes mellitus without complications: Secondary | ICD-10-CM

## 2023-11-03 DIAGNOSIS — E782 Mixed hyperlipidemia: Secondary | ICD-10-CM

## 2023-11-03 DIAGNOSIS — F32A Depression, unspecified: Secondary | ICD-10-CM

## 2023-11-03 DIAGNOSIS — Z1329 Encounter for screening for other suspected endocrine disorder: Secondary | ICD-10-CM

## 2023-11-03 DIAGNOSIS — D696 Thrombocytopenia, unspecified: Secondary | ICD-10-CM

## 2023-11-07 ENCOUNTER — Ambulatory Visit: Admitting: Family Medicine

## 2023-11-07 ENCOUNTER — Encounter: Payer: Self-pay | Admitting: Family Medicine

## 2023-11-07 ENCOUNTER — Ambulatory Visit (INDEPENDENT_AMBULATORY_CARE_PROVIDER_SITE_OTHER): Admitting: Family Medicine

## 2023-11-07 VITALS — BP 146/83 | HR 100 | Ht 66.0 in | Wt 185.0 lb

## 2023-11-07 DIAGNOSIS — E1165 Type 2 diabetes mellitus with hyperglycemia: Secondary | ICD-10-CM | POA: Diagnosis not present

## 2023-11-07 DIAGNOSIS — Z7984 Long term (current) use of oral hypoglycemic drugs: Secondary | ICD-10-CM | POA: Diagnosis not present

## 2023-11-07 DIAGNOSIS — I10 Essential (primary) hypertension: Secondary | ICD-10-CM

## 2023-11-07 DIAGNOSIS — F32A Depression, unspecified: Secondary | ICD-10-CM

## 2023-11-07 DIAGNOSIS — D696 Thrombocytopenia, unspecified: Secondary | ICD-10-CM | POA: Diagnosis not present

## 2023-11-07 DIAGNOSIS — E782 Mixed hyperlipidemia: Secondary | ICD-10-CM

## 2023-11-07 DIAGNOSIS — F419 Anxiety disorder, unspecified: Secondary | ICD-10-CM | POA: Diagnosis not present

## 2023-11-07 LAB — CBC WITH DIFFERENTIAL/PLATELET
Basophils Absolute: 0.1 10*3/uL (ref 0.0–0.1)
Basophils Relative: 1 % (ref 0.0–3.0)
Eosinophils Absolute: 0 10*3/uL (ref 0.0–0.7)
Eosinophils Relative: 0.2 % (ref 0.0–5.0)
HCT: 37.5 % (ref 36.0–46.0)
Hemoglobin: 12.7 g/dL (ref 12.0–15.0)
Lymphocytes Relative: 18.9 % (ref 12.0–46.0)
Lymphs Abs: 1.3 10*3/uL (ref 0.7–4.0)
MCHC: 33.7 g/dL (ref 30.0–36.0)
MCV: 84.4 fl (ref 78.0–100.0)
Monocytes Absolute: 0.8 10*3/uL (ref 0.1–1.0)
Monocytes Relative: 11.4 % (ref 3.0–12.0)
Neutro Abs: 4.6 10*3/uL (ref 1.4–7.7)
Neutrophils Relative %: 68.5 % (ref 43.0–77.0)
Platelets: 60 10*3/uL — ABNORMAL LOW (ref 150.0–400.0)
RBC: 4.44 Mil/uL (ref 3.87–5.11)
RDW: 16.5 % — ABNORMAL HIGH (ref 11.5–15.5)
WBC: 6.7 10*3/uL (ref 4.0–10.5)

## 2023-11-07 LAB — COMPREHENSIVE METABOLIC PANEL
ALT: 12 U/L (ref 0–35)
AST: 21 U/L (ref 0–37)
Albumin: 4.8 g/dL (ref 3.5–5.2)
Alkaline Phosphatase: 52 U/L (ref 39–117)
BUN: 21 mg/dL (ref 6–23)
CO2: 29 meq/L (ref 19–32)
Calcium: 9.8 mg/dL (ref 8.4–10.5)
Chloride: 97 meq/L (ref 96–112)
Creatinine, Ser: 0.69 mg/dL (ref 0.40–1.20)
GFR: 81.41 mL/min (ref >60.00)
Glucose, Bld: 115 mg/dL — ABNORMAL HIGH (ref 70–99)
Potassium: 3.6 meq/L (ref 3.5–5.1)
Sodium: 138 meq/L (ref 135–145)
Total Bilirubin: 1.2 mg/dL (ref 0.2–1.2)
Total Protein: 7.8 g/dL (ref 6.0–8.3)

## 2023-11-07 LAB — LIPID PANEL
Cholesterol: 162 mg/dL (ref 0–200)
HDL: 65.1 mg/dL (ref >39.00)
LDL Cholesterol: 45 mg/dL (ref 0–99)
NonHDL: 96.43
Total CHOL/HDL Ratio: 2
Triglycerides: 256 mg/dL — ABNORMAL HIGH (ref 0.0–149.0)
VLDL: 51.2 mg/dL — ABNORMAL HIGH (ref 0.0–40.0)

## 2023-11-07 LAB — HEMOGLOBIN A1C: Hgb A1c MFr Bld: 7.5 % — ABNORMAL HIGH (ref 4.6–6.5)

## 2023-11-07 LAB — MICROALBUMIN / CREATININE URINE RATIO
Creatinine,U: 29.6 mg/dL
Microalb Creat Ratio: 121.9 mg/g — ABNORMAL HIGH (ref 0.0–30.0)
Microalb, Ur: 3.6 mg/dL — ABNORMAL HIGH (ref 0.0–1.9)

## 2023-11-07 LAB — TSH: TSH: 4.02 u[IU]/mL (ref 0.35–5.50)

## 2023-11-07 NOTE — Assessment & Plan Note (Signed)
-  Check lipid panel today. (Not fasting) -Continue simvastatin and lifestyle measures.

## 2023-11-07 NOTE — Assessment & Plan Note (Signed)
 Managed by hematology. Recent increased bleeding noted with finger sticks. -Check platelet count today and refer to hematology if significantly changed. -Patient aware of signs/symptoms requiring further/urgent evaluation.

## 2023-11-07 NOTE — Progress Notes (Signed)
 Established Patient Office Visit  Subjective   Patient ID: Rachael Buck, female    DOB: [birth year]  Age: 83 y.o. MRN: [id number]  Chief Complaint  Patient presents with   Medical Management of Chronic Issues    HPI   Discussed the use of AI scribe software for clinical note transcription with the patient, who gave verbal consent to proceed.  History of Present Illness The patient presents for a follow-up visit.  She has a history of low platelet counts and follows up with the hematology department for this condition. Recently, she noticed increased bleeding when checking her blood sugar, as the blood oozes out more than usual, which is a change from having to squeeze her finger to get blood. She was previously having monthly appointments but has been extended to every two months.  She has a history of type 2 diabetes and is currently taking Jardiance. She monitors her blood sugar regularly and notes that her sugar levels might be affected by her recent dietary changes due to dental issues. She reports having teeth pulled in December 2024. Since then, she has been unable to eat solid foods and has been consuming soft foods like mashed potatoes and banana pudding. This dietary change has impacted her blood sugar levels.  She uses alprazolam three times a day, which was prescribed due to a difficult year marked by multiple family deaths, including her husband, his brother, her aunt, and her sister-in-law. This has contributed to significant emotional stress.        ROS All review of systems negative except what is listed in the HPI    Objective:     BP (!) 146/83   Pulse 100   Ht 5\' 6"  (1.676 m)   Wt 185 lb (83.9 kg)   SpO2 97%   BMI 29.86 kg/m    Physical Exam Vitals reviewed.  Constitutional:      General: She is not in acute distress.    Appearance: Normal appearance. She is obese. She is not ill-appearing.  Cardiovascular:     Rate and Rhythm: Normal rate  and regular rhythm.  Pulmonary:     Effort: Pulmonary effort is normal.     Breath sounds: Normal breath sounds.  Skin:    General: Skin is warm and dry.  Neurological:     Mental Status: She is alert and oriented to person, place, and time.  Psychiatric:        Mood and Affect: Mood normal.        Behavior: Behavior normal.        Thought Content: Thought content normal.        Judgment: Judgment normal.      No results found for any visits on 11/07/23.    The ASCVD Risk score (Arnett DK, et al., 2019) failed to calculate for the following reasons:   The 2019 ASCVD risk score is only valid for ages 76 to 38    Assessment & Plan:   Problem List Items Addressed This Visit       Active Problems   Mixed hyperlipidemia - Primary   -Check lipid panel today. (Not fasting) -Continue simvastatin and lifestyle measures.       Relevant Orders   Comprehensive metabolic panel   Lipid panel   HTN (hypertension)   Blood pressure slightly elevated today. -Check blood pressure at next appointment with hematology in 1 month. -Continue HCTZ, losartan. -Continue heart healthy lifestyle.       Relevant  Orders   Lipid panel   TSH   Thrombocytopenia (HCC)   Managed by hematology. Recent increased bleeding noted with finger sticks. -Check platelet count today and refer to hematology if significantly changed. -Patient aware of signs/symptoms requiring further/urgent evaluation.       Relevant Orders   CBC with Differential/Platelet   Type 2 diabetes mellitus with hyperglycemia, without long-term current use of insulin (HCC)   Recent dietary changes due to dental extractions may impact glycemic control. -Check HbA1c and microalbumin today. -Continue Farxiga, glipizide      Relevant Orders   Hemoglobin A1c   Microalbumin / creatinine urine ratio   Other Visit Diagnoses       Anxiety and depression     Multiple recent personal losses. No SI/HI. -Continue daily Zoloft and  alprazolam 3 times daily as needed. No refills needed at this time.        Return in about 3 months (around 02/07/2024), or if symptoms worsen or fail to improve, for routine follow-up with PCP.    Clayborne Dana, NP

## 2023-11-07 NOTE — Assessment & Plan Note (Signed)
 Recent dietary changes due to dental extractions may impact glycemic control. -Check HbA1c and microalbumin today. -Continue Farxiga, glipizide

## 2023-11-07 NOTE — Assessment & Plan Note (Signed)
 Blood pressure slightly elevated today. -Check blood pressure at next appointment with hematology in 1 month. -Continue HCTZ, losartan. -Continue heart healthy lifestyle.

## 2023-11-08 ENCOUNTER — Other Ambulatory Visit: Payer: Self-pay | Admitting: Family Medicine

## 2023-11-08 ENCOUNTER — Encounter: Payer: Self-pay | Admitting: Family Medicine

## 2023-11-08 DIAGNOSIS — E119 Type 2 diabetes mellitus without complications: Secondary | ICD-10-CM

## 2023-11-08 MED ORDER — DAPAGLIFLOZIN PROPANEDIOL 10 MG PO TABS: 10.0000 mg | ORAL_TABLET | Freq: Every day | ORAL | 1 refills | Status: DC

## 2023-11-08 NOTE — Progress Notes (Signed)
 Microalbumin/Creatinine ratio is elevated, but stable GFR. You need to continue your losartan and keep close control of your blood pressure and blood sugar. Important to stay hydrated. Dr. Patsy Lager will keep an eye on these levels for you.  Triglycerides are still elevated, but close to your baseline. I don't think you were fasting so this can contribute. Recommend heart healthy diet, Omega-3 supplement, and exercise.   A1c has gone up some. I'd recommend increasing your Farxiga to 10 mg daily - sending in for you.

## 2023-11-11 ENCOUNTER — Telehealth: Payer: Self-pay | Admitting: *Deleted

## 2023-11-11 ENCOUNTER — Encounter: Payer: Self-pay | Admitting: Emergency Medicine

## 2023-11-11 ENCOUNTER — Ambulatory Visit: Payer: PPO | Admitting: Emergency Medicine

## 2023-11-11 VITALS — BP 122/60 | HR 83 | Ht 66.0 in | Wt 184.8 lb

## 2023-11-11 DIAGNOSIS — Z87891 Personal history of nicotine dependence: Secondary | ICD-10-CM

## 2023-11-11 DIAGNOSIS — J4489 Other specified chronic obstructive pulmonary disease: Secondary | ICD-10-CM

## 2023-11-11 DIAGNOSIS — J301 Allergic rhinitis due to pollen: Secondary | ICD-10-CM

## 2023-11-11 NOTE — Patient Instructions (Signed)
 It is good to see you today.  I am glad that you are doing well. Please continue your Stiolto 2 puffs once daily. Keep your albuterol available to use 2 puffs when needed for shortness of breath, chest tightness, wheezing. Continue your Benadryl as you have been taking it.  You probably need to take this medication on a schedule through the allergy season. Continue your nasal saline rinses Consider starting your Flonase nasal spray, 2 sprays each nostril once daily every day during the allergy season. Okay to intermittently use Sudafed to relieve congestion and sinus pressure.  If you feel that you are needing this medication every day then we should discuss and possibly change your maintenance medication. Follow with Dr. Delton Coombes in 12 months or sooner if you have any problems.

## 2023-11-11 NOTE — Progress Notes (Signed)
 Subjective:    Patient ID: Rachael Buck, female    DOB: 03-12-1941, 83 y.o.   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  Also with upper airway irritation syndrome, occasional stridor in the setting of chronic allergic rhinitis.  PMH also significant for NASH, diabetes, splenomegaly, COVID-19 in 09/2021. She is on stiolto. No URI or flares since I last saw her. Never needs her albuterol. She did not get flu shot this year.  She is having a lot of congestion and drainage right now. Dry cough. Ear fullness. Has been happening for a couple days. Started benadryl 3-4 d ago. She also uses [place] prn. She has flonase, hasn't used it yet.    Review of Systems As per HPI  Past Medical History:  Diagnosis Date   Anxiety    Arthritis    Asthma    Cirrhosis, nonalcoholic (HCC)    Colon polyps    Diabetes mellitus    Diverticulitis    GERD (gastroesophageal reflux disease)    Hyperlipidemia    Hypertension    NASH (nonalcoholic steatohepatitis)    Obesity    Pneumonia      Family History  Problem Relation Age of Onset   Stomach cancer Mother    Diabetes Sister    Breast cancer Daughter 58   Breast cancer Maternal Aunt    Diabetes Brother    Cancer Other    GER disease Other    Obesity Other      Social History   Socioeconomic History   Marital status: Widowed    Spouse name: Not on file   Number of children: Not on file   Years of education: Not on file   Highest education level: Not on file  Occupational History   Not on file  Tobacco Use   Smoking status: Former    Current packs/day: 1.50    Average packs/day: 1.5 packs/day for 15.0 years (22.5 ttl pk-yrs)    Types: Cigarettes   Smokeless tobacco: Never   Tobacco comments:    Quit 2002.  Vaping Use   Vaping status: Never Used  Substance and Sexual Activity   Alcohol use: No   Drug use: No   Sexual  activity: Not Currently  Other Topics Concern   Not on file  Social History Narrative   Not on file   Social Drivers of Health   Financial Resource Strain: Low Risk  (12/29/2021)   Overall Financial Resource Strain (CARDIA)    Difficulty of Paying Living Expenses: Not hard at all  Food Insecurity: No Food Insecurity (03/29/2022)   Hunger Vital Sign    Worried About Running Out of Food in the Last Year: Never true    Ran Out of Food in the Last Year: Never true  Transportation Needs: No Transportation Needs (03/29/2022)   PRAPARE - Administrator, Civil Service (Medical): No    Lack of Transportation (Non-Medical): No  Physical Activity: Inactive (12/29/2021)   Exercise Vital Sign    Days of Exercise per Week: 0 days    Minutes of Exercise per Session: 0 min  Stress: No Stress Concern Present (12/29/2021)   [employer] of Occupational Health - Occupational Stress Questionnaire    Feeling of Stress : Not at all  Social Connections: Moderately Integrated (12/29/2021)   Social Connection and Isolation Panel [NHANES]    Frequency of  Communication with Friends and Family: More than three times a week    Frequency of Social Gatherings with Friends and Family: Once a week    Attends Religious Services: More than 4 times per year    Active Member of [employer] or Organizations: No    Attends Banker Meetings: Never    Marital Status: Married  Catering manager Violence: Not At Risk (03/29/2022)   Humiliation, Afraid, Rape, and Kick questionnaire    Fear of Current or Ex-Partner: No    Emotionally Abused: No    Physically Abused: No    Sexually Abused: No     Allergies  Allergen Reactions   Codeine Nausea And Vomiting   Metformin And Related Diarrhea   Shrimp [Shellfish Allergy] Swelling     Outpatient Medications Prior to Visit  Medication Sig Dispense Refill   acetaminophen (TYLENOL) 500 MG tablet Take 500 mg by mouth every 6 (six) hours as needed for mild  pain.     ALPRAZolam (XANAX) 0.5 MG tablet TAKE 1 TABLET(0.5 MG) BY MOUTH THREE TIMES DAILY 90 tablet 0   blood glucose meter kit and supplies KIT Dispense based on patient and insurance preference. Use up to four times daily as directed. 1 each 0   colestipol (COLESTID) 1 g tablet 1 g daily.     dapagliflozin propanediol (FARXIGA) 10 MG TABS tablet Take 1 tablet (10 mg total) by mouth daily before breakfast. 90 tablet 1   glipiZIDE (GLUCOTROL XL) 5 MG 24 hr tablet TAKE 1 TABLET(5 MG) BY MOUTH DAILY WITH BREAKFAST 90 tablet 3   glucose blood (ONETOUCH ULTRA) test strip TEST BLOOD SUGAR(S) ONCE DAILY IN THE MORNING 100 strip 12   hydrochlorothiazide (HYDRODIURIL) 25 MG tablet Take 1 tablet (25 mg total) by mouth daily. 30 tablet 0   hydrocortisone 2.5 % cream Apply 1 application  topically daily as needed (itchy dry skin).     ipratropium-albuterol (DUONEB) 0.5-2.5 (3) MG/3ML SOLN Use one 3 ml neb treatment as needed every 6-8 hours prn 360 mL 1   loperamide (IMODIUM) 2 MG capsule Take 4-6 mg by mouth daily as needed (for IBS symptoms).     ONETOUCH DELICA LANCETS FINE MISC 1 each by Other route daily. 100 each 12   sertraline (ZOLOFT) 100 MG tablet Take 1.5 tablets (150 mg total) by mouth daily. 135 tablet 2   simvastatin (ZOCOR) 80 MG tablet TAKE 1 TABLET(80 MG) BY MOUTH DAILY 90 tablet 0   STIOLTO RESPIMAT 2.5-2.5 MCG/ACT AERS INHALE 2 PUFFS INTO THE LUNGS DAILY 4 g 5   Tiotropium Bromide-Olodaterol (STIOLTO RESPIMAT) 2.5-2.5 MCG/ACT AERS Inhale 2 puffs into the lungs daily. 4 g 1   traMADol (ULTRAM) 50 MG tablet TAKE 1 TABLET(50 MG) BY MOUTH EVERY 6 HOURS AS NEEDED 60 tablet 0   albuterol (PROAIR HFA) 108 (90 Base) MCG/ACT inhaler Inhale 2 puffs into the lungs every 4 (four) hours as needed for wheezing or shortness of breath. (Patient not taking: Reported on 11/11/2023) 1 Inhaler 6   bismuth subsalicylate (PEPTO BISMOL) 262 MG/15ML suspension Take 30 mLs by mouth every 4 (four) hours as needed.  (Patient not taking: Reported on 11/11/2023)     losartan (COZAAR) 50 MG tablet Take 1 tablet (50 mg total) by mouth daily. (Patient not taking: Reported on 11/11/2023) 90 tablet 1   sucralfate (CARAFATE) 1 g tablet Take 1 g by mouth 4 (four) times daily as needed. (Patient not taking: Reported on 11/11/2023)  No facility-administered medications prior to visit.         Objective:   Physical Exam Vitals:   11/11/23 1042  BP: 122/60  Pulse: 83  SpO2: 96%  Weight: 184 lb 12.8 oz (83.8 kg)  Height: 5\' 6"  (1.676 m)   Gen: Pleasant, overweight woman, in no distress,  normal affect  ENT: No lesions,  mouth clear,  oropharynx clear, no postnasal drip  Neck: No JVD, no stridor  Lungs: No use of accessory muscles, no crackles or wheezing on normal respiration, no wheeze on forced expiration  Cardiovascular: RRR, heart sounds normal, no murmur or gallops, no peripheral edema  Musculoskeletal: No deformities, no cyanosis or clubbing  Neuro: alert, awake, non focal  Skin: Warm, no lesions or rash     Assessment & Plan:  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 Keep your albuterol available to use 2 puffs when needed for shortness of breath, chest tightness, wheezing. Follow with Dr. Delton Coombes in 12 months or sooner if you have any problems.   ALLERGIC RHINITIS CAUSE UNSPECIFIED Continue your Benadryl as you have been taking it.  You probably need to take this medication on a schedule through the allergy season. Continue your nasal saline rinses Consider starting your Flonase nasal spray, 2 sprays each nostril once daily every day during the allergy season. Okay to intermittently use Sudafed to relieve congestion and sinus pressure.  If you feel that you are needing this medication every day then we should discuss and possibly change your maintenance medication.   Levy Pupa, MD, PhD 11/11/2023, 11:10  AM [facility] [phone] or if no answer before 7:00PM call [phone] For any issues after 7:00PM please call eLink [phone]

## 2023-11-11 NOTE — Telephone Encounter (Signed)
 Returned patient's phone call regarding a platelet level of 60. She stated,"I'm going to the beach next Saturday, 11/19/23, and my platelet count is low. I'm supposed to get NPlate if my platelets are less than 75." Per Dr. Myna Hidalgo, her come in next week for the injection before she goes to the beach. She verbalized understanding. She is going to come on Tuesday, March, 18th at 10:45 am. She verbalized understanding.

## 2023-11-11 NOTE — Assessment & Plan Note (Signed)
 Please continue your Stiolto 2 puffs once daily. Keep your albuterol available to use 2 puffs when needed for shortness of breath, chest tightness, wheezing. Follow with Dr. Delton Coombes in 12 months or sooner if you have any problems.

## 2023-11-11 NOTE — Assessment & Plan Note (Signed)
 Continue your Benadryl as you have been taking it.  You probably need to take this medication on a schedule through the allergy season. Continue your nasal saline rinses Consider starting your Flonase nasal spray, 2 sprays each nostril once daily every day during the allergy season. Okay to intermittently use Sudafed to relieve congestion and sinus pressure.  If you feel that you are needing this medication every day then we should discuss and possibly change your maintenance medication.

## 2023-11-15 ENCOUNTER — Inpatient Hospital Stay: Attending: Family

## 2023-11-15 VITALS — BP 150/85 | HR 93 | Temp 98.0°F | Resp 18

## 2023-11-15 DIAGNOSIS — D693 Immune thrombocytopenic purpura: Secondary | ICD-10-CM | POA: Diagnosis not present

## 2023-11-15 DIAGNOSIS — Z79899 Other long term (current) drug therapy: Secondary | ICD-10-CM | POA: Insufficient documentation

## 2023-11-15 DIAGNOSIS — D696 Thrombocytopenia, unspecified: Secondary | ICD-10-CM

## 2023-11-15 MED ORDER — ROMIPLOSTIM 125 MCG ~~LOC~~ SOLR
1.0000 ug/kg | SUBCUTANEOUS | Status: DC
  Administered 2023-11-15: 85 ug via SUBCUTANEOUS
  Filled 2023-11-15: qty 0.17

## 2023-11-15 NOTE — Patient Instructions (Signed)
 Romiplostim Injection What is this medication? ROMIPLOSTIM (roe mi PLOE stim) treats low levels of platelets in your body caused by immune thrombocytopenia (ITP). It is prescribed when other medications have not worked or cannot be tolerated. It may also be used to help people who have been exposed to high doses of radiation. It works by increasing the amount of platelets in your blood. This lowers the risk of bleeding. This medicine may be used for other purposes; ask your health care provider or pharmacist if you have questions. COMMON BRAND NAME(S): Nplate What should I tell my care team before I take this medication? They need to know if you have any of these conditions: Blood clots Myelodysplastic syndrome An unusual or allergic reaction to romiplostim, mannitol, other medications, foods, dyes, or preservatives Pregnant or trying to get pregnant Breast-feeding How should I use this medication? This medication is injected under the skin. It is given by a care team in a hospital or clinic setting. A special MedGuide will be given to you before each treatment. Be sure to read this information carefully each time. Talk to your care team about the use of this medication in children. While it may be prescribed for children as young as newborns for selected conditions, precautions do apply. Overdosage: If you think you have taken too much of this medicine contact a poison control center or emergency room at once. NOTE: This medicine is only for you. Do not share this medicine with others. What if I miss a dose? Keep appointments for follow-up doses. It is important not to miss your dose. Call your care team if you are unable to keep an appointment. What may interact with this medication? Interactions are not expected. This list may not describe all possible interactions. Give your health care provider a list of all the medicines, herbs, non-prescription drugs, or dietary supplements you use. Also  tell them if you smoke, drink alcohol, or use illegal drugs. Some items may interact with your medicine. What should I watch for while using this medication? Visit your care team for regular checks on your progress. You may need blood work done while you are taking this medication. Your condition will be monitored carefully while you are receiving this medication. It is important not to miss any appointments. What side effects may I notice from receiving this medication? Side effects that you should report to your care team as soon as possible: Allergic reactions--skin rash, itching, hives, swelling of the face, lips, tongue, or throat Blood clot--pain, swelling, or warmth in the leg, shortness of breath, chest pain Side effects that usually do not require medical attention (report to your care team if they continue or are bothersome): Dizziness Joint pain Muscle pain Pain in the hands or feet Stomach pain Trouble sleeping This list may not describe all possible side effects. Call your doctor for medical advice about side effects. You may report side effects to FDA at 1-800-FDA-1088. Where should I keep my medication? This medication is given in a hospital or clinic. It will not be stored at home. NOTE: This sheet is a summary. It may not cover all possible information. If you have questions about this medicine, talk to your doctor, pharmacist, or health care provider.  2024 Elsevier/Gold Standard (2021-12-21 00:00:00)

## 2023-11-15 NOTE — Progress Notes (Signed)
 Use 3/10 labs with pltc 60 for Nplate dosing today.  Anola Gurney [place], [place], BCPS, BCOP 11/15/2023 10:38 AM

## 2023-11-16 ENCOUNTER — Other Ambulatory Visit: Payer: Self-pay | Admitting: Family Medicine

## 2023-11-16 DIAGNOSIS — E782 Mixed hyperlipidemia: Secondary | ICD-10-CM

## 2023-11-28 ENCOUNTER — Other Ambulatory Visit: Payer: Self-pay | Admitting: Family Medicine

## 2023-11-28 DIAGNOSIS — F419 Anxiety disorder, unspecified: Secondary | ICD-10-CM

## 2023-12-02 ENCOUNTER — Other Ambulatory Visit: Payer: Self-pay | Admitting: *Deleted

## 2023-12-02 DIAGNOSIS — D696 Thrombocytopenia, unspecified: Secondary | ICD-10-CM

## 2023-12-02 DIAGNOSIS — E876 Hypokalemia: Secondary | ICD-10-CM

## 2023-12-02 DIAGNOSIS — D500 Iron deficiency anemia secondary to blood loss (chronic): Secondary | ICD-10-CM

## 2023-12-05 ENCOUNTER — Inpatient Hospital Stay: Payer: PPO | Admitting: Hematology & Oncology

## 2023-12-05 ENCOUNTER — Other Ambulatory Visit: Payer: Self-pay

## 2023-12-05 ENCOUNTER — Encounter: Payer: Self-pay | Admitting: Hematology & Oncology

## 2023-12-05 ENCOUNTER — Inpatient Hospital Stay: Payer: PPO

## 2023-12-05 ENCOUNTER — Inpatient Hospital Stay: Payer: PPO | Attending: Family

## 2023-12-05 DIAGNOSIS — D693 Immune thrombocytopenic purpura: Secondary | ICD-10-CM | POA: Insufficient documentation

## 2023-12-05 DIAGNOSIS — E876 Hypokalemia: Secondary | ICD-10-CM

## 2023-12-05 DIAGNOSIS — K7581 Nonalcoholic steatohepatitis (NASH): Secondary | ICD-10-CM | POA: Diagnosis not present

## 2023-12-05 DIAGNOSIS — D696 Thrombocytopenia, unspecified: Secondary | ICD-10-CM

## 2023-12-05 DIAGNOSIS — R161 Splenomegaly, not elsewhere classified: Secondary | ICD-10-CM | POA: Diagnosis not present

## 2023-12-05 DIAGNOSIS — Z79899 Other long term (current) drug therapy: Secondary | ICD-10-CM | POA: Diagnosis not present

## 2023-12-05 DIAGNOSIS — E611 Iron deficiency: Secondary | ICD-10-CM | POA: Insufficient documentation

## 2023-12-05 DIAGNOSIS — L723 Sebaceous cyst: Secondary | ICD-10-CM | POA: Diagnosis not present

## 2023-12-05 DIAGNOSIS — Z7984 Long term (current) use of oral hypoglycemic drugs: Secondary | ICD-10-CM | POA: Insufficient documentation

## 2023-12-05 DIAGNOSIS — D500 Iron deficiency anemia secondary to blood loss (chronic): Secondary | ICD-10-CM

## 2023-12-05 LAB — CMP (CANCER CENTER ONLY)
ALT: 11 U/L (ref 0–44)
AST: 20 U/L (ref 15–41)
Albumin: 4.6 g/dL (ref 3.5–5.0)
Alkaline Phosphatase: 48 U/L (ref 38–126)
Anion gap: 8 (ref 5–15)
BUN: 15 mg/dL (ref 8–23)
CO2: 35 mmol/L — ABNORMAL HIGH (ref 22–32)
Calcium: 10.4 mg/dL — ABNORMAL HIGH (ref 8.9–10.3)
Chloride: 95 mmol/L — ABNORMAL LOW (ref 98–111)
Creatinine: 0.75 mg/dL (ref 0.44–1.00)
GFR, Estimated: >60 mL/min (ref >60)
Glucose, Bld: 146 mg/dL — ABNORMAL HIGH (ref 70–99)
Potassium: 3.6 mmol/L (ref 3.5–5.1)
Sodium: 138 mmol/L (ref 135–145)
Total Bilirubin: 1 mg/dL (ref 0.0–1.2)
Total Protein: 7.5 g/dL (ref 6.5–8.1)

## 2023-12-05 LAB — IRON AND IRON BINDING CAPACITY (CC-WL,HP ONLY)
Iron: 90 ug/dL (ref 28–170)
Saturation Ratios: 17 % (ref 10.4–31.8)
TIBC: 529 ug/dL — ABNORMAL HIGH (ref 250–450)
UIBC: 439 ug/dL (ref 148–442)

## 2023-12-05 LAB — FERRITIN: Ferritin: 15 ng/mL (ref 11–307)

## 2023-12-05 LAB — CBC WITH DIFFERENTIAL (CANCER CENTER ONLY)
Abs Immature Granulocytes: 0.04 10*3/uL (ref 0.00–0.07)
Basophils Absolute: 0 10*3/uL (ref 0.0–0.1)
Basophils Relative: 1 %
Eosinophils Absolute: 0 10*3/uL (ref 0.0–0.5)
Eosinophils Relative: 0 %
HCT: 35.6 % — ABNORMAL LOW (ref 36.0–46.0)
Hemoglobin: 12 g/dL (ref 12.0–15.0)
Immature Granulocytes: 1 %
Lymphocytes Relative: 20 %
Lymphs Abs: 1.1 10*3/uL (ref 0.7–4.0)
MCH: 28 pg (ref 26.0–34.0)
MCHC: 33.7 g/dL (ref 30.0–36.0)
MCV: 83.2 fL (ref 80.0–100.0)
Monocytes Absolute: 0.6 10*3/uL (ref 0.1–1.0)
Monocytes Relative: 11 %
Neutro Abs: 3.6 10*3/uL (ref 1.7–7.7)
Neutrophils Relative %: 67 %
Platelet Count: 73 10*3/uL — ABNORMAL LOW (ref 150–400)
RBC: 4.28 MIL/uL (ref 3.87–5.11)
RDW: 16.3 % — ABNORMAL HIGH (ref 11.5–15.5)
WBC Count: 5.3 10*3/uL (ref 4.0–10.5)
nRBC: 0 % (ref 0.0–0.2)

## 2023-12-05 LAB — SAVE SMEAR(SSMR), FOR PROVIDER SLIDE REVIEW

## 2023-12-05 LAB — LACTATE DEHYDROGENASE: LDH: 121 U/L (ref 98–192)

## 2023-12-05 MED ORDER — ROMIPLOSTIM 125 MCG ~~LOC~~ SOLR
85.0000 ug | Freq: Once | SUBCUTANEOUS | Status: AC
  Administered 2023-12-05: 85 ug via SUBCUTANEOUS
  Filled 2023-12-05: qty 0.17

## 2023-12-05 NOTE — Progress Notes (Signed)
 Hematology and Oncology Follow Up Visit  Rachael Buck [id number] 19-Oct-1940 83 y.o. 12/05/2023   Principle Diagnosis:  Thrombocytopenia -- NASH/Splenomegaly ; Immune based thrombocytopenia Iron deficiency   Previous Therapy:  Doptelet 40 mg po q day -- start on 06/23/2023 for dental extraction -now DC'd on 08/15/2023   Current Therapy:        Nplate as indicated --give for platelet count < 75K IV iron as indicated  -- Feraheme given on -07/30/2022      Interim History:  Rachael Buck is here today for follow-up.  Her dental work seems to be going pretty well.  She had she had the extractions back in early December.  She now is awaiting the dentures.  Hopefully, these will fit well.  The next problem is she has that she has these sebaceous cysts on her back.  They may need to be excised.  If so, she would probably need to have her platelet count a little bit higher.  She otherwise, has had no issues.  She has had no bleeding.  There is been no problems with bowels or bladder.  She has had no nausea or vomiting.  There is been no cough or shortness of breath.  There is been no leg swelling.  Overall, I would have to say that her performance status is ECOG 1.   .   Wt Readings from Last 3 Encounters:  12/05/23 (P) 185 lb (83.9 kg)  11/11/23 184 lb 12.8 oz (83.8 kg)  11/07/23 185 lb (83.9 kg)     Medications:  Allergies as of 12/05/2023       Reactions   Codeine Nausea And Vomiting   Metformin And Related Diarrhea   Shrimp [shellfish Allergy] Swelling        Medication List        Accurate as of December 05, 2023 12:56 PM. If you have any questions, ask your nurse or doctor.          acetaminophen 500 MG tablet Commonly known as: TYLENOL Take 500 mg by mouth every 6 (six) hours as needed for mild pain.   albuterol 108 (90 Base) MCG/ACT inhaler Commonly known as: ProAir HFA Inhale 2 puffs into the lungs every 4 (four) hours as needed for wheezing or shortness of  breath.   ALPRAZolam 0.5 MG tablet Commonly known as: XANAX TAKE 1 TABLET(0.5 MG) BY MOUTH THREE TIMES DAILY   bismuth subsalicylate 262 MG/15ML suspension Commonly known as: PEPTO BISMOL Take 30 mLs by mouth every 4 (four) hours as needed.   blood glucose meter kit and supplies Kit Dispense based on patient and insurance preference. Use up to four times daily as directed.   colestipol 1 g tablet Commonly known as: COLESTID 1 g daily.   dapagliflozin propanediol 10 MG Tabs tablet Commonly known as: Farxiga Take 1 tablet (10 mg total) by mouth daily before breakfast.   glipiZIDE 5 MG 24 hr tablet Commonly known as: GLUCOTROL XL TAKE 1 TABLET(5 MG) BY MOUTH DAILY WITH BREAKFAST   hydrochlorothiazide 25 MG tablet Commonly known as: HYDRODIURIL Take 1 tablet (25 mg total) by mouth daily.   hydrocortisone 2.5 % cream Apply 1 application  topically daily as needed (itchy dry skin).   ipratropium-albuterol 0.5-2.5 (3) MG/3ML Soln Commonly known as: DUONEB Use one 3 ml neb treatment as needed every 6-8 hours prn   loperamide 2 MG capsule Commonly known as: IMODIUM Take 4-6 mg by mouth daily as needed (for IBS symptoms).  losartan 50 MG tablet Commonly known as: COZAAR Take 1 tablet (50 mg total) by mouth daily.   OneTouch Delica Lancets Fine Misc 1 each by Other route daily.   OneTouch Ultra test strip Generic drug: glucose blood TEST BLOOD SUGAR(S) ONCE DAILY IN THE MORNING   sertraline 100 MG tablet Commonly known as: ZOLOFT Take 1.5 tablets (150 mg total) by mouth daily.   simvastatin 80 MG tablet Commonly known as: ZOCOR TAKE 1 TABLET(80 MG) BY MOUTH DAILY   Stiolto Respimat 2.5-2.5 MCG/ACT Aers Generic drug: Tiotropium Bromide-Olodaterol INHALE 2 PUFFS INTO THE LUNGS DAILY   Stiolto Respimat 2.5-2.5 MCG/ACT Aers Generic drug: Tiotropium Bromide-Olodaterol Inhale 2 puffs into the lungs daily.   sucralfate 1 g tablet Commonly known as: CARAFATE Take 1  g by mouth 4 (four) times daily as needed.   traMADol 50 MG tablet Commonly known as: ULTRAM TAKE 1 TABLET(50 MG) BY MOUTH EVERY 6 HOURS AS NEEDED        Allergies:  Allergies  Allergen Reactions   Codeine Nausea And Vomiting   Metformin And Related Diarrhea   Shrimp [Shellfish Allergy] Swelling    Past Medical History, Surgical history, Social history, and Family History were reviewed and updated.  Review of Systems: Review of Systems  Constitutional:  Negative for malaise/fatigue.  HENT: Negative.    Eyes: Negative.   Respiratory: Negative.    Cardiovascular: Negative.   Gastrointestinal:  Negative for abdominal pain.  Genitourinary: Negative.   Musculoskeletal: Negative.   Skin: Negative.   Neurological: Negative.   Endo/Heme/Allergies: Negative.   Psychiatric/Behavioral: Negative.     Physical Exam:  height is 5\' 6"  (1.676 m) (pended) and weight is 185 lb (83.9 kg) (pended). Her oral temperature is 97.8 F (36.6 C) (pended). Her blood pressure is 112/58 (abnormal, pended) and her pulse is 80 (pended). Her respiration is 19 (pended) and oxygen saturation is 96% (pended).   Wt Readings from Last 3 Encounters:  12/05/23 (P) 185 lb (83.9 kg)  11/11/23 184 lb 12.8 oz (83.8 kg)  11/07/23 185 lb (83.9 kg)    Physical Exam Vitals reviewed.  HENT:     Head: Normocephalic and atraumatic.  Eyes:     Pupils: Pupils are equal, round, and reactive to light.  Cardiovascular:     Rate and Rhythm: Normal rate and regular rhythm.     Heart sounds: Normal heart sounds.  Pulmonary:     Effort: Pulmonary effort is normal.     Breath sounds: Normal breath sounds.  Abdominal:     General: Bowel sounds are normal.     Palpations: Abdomen is soft.  Musculoskeletal:        General: No tenderness or deformity. Normal range of motion.     Cervical back: Normal range of motion.  Lymphadenopathy:     Cervical: No cervical adenopathy.  Skin:    General: Skin is warm and dry.      Findings: No erythema or rash.  Neurological:     Mental Status: She is alert and oriented to person, place, and time.  Psychiatric:        Behavior: Behavior normal.        Thought Content: Thought content normal.        Judgment: Judgment normal.     Lab Results  Component Value Date   WBC 6.7 11/07/2023   HGB 12.7 11/07/2023   HCT 37.5 11/07/2023   MCV 84.4 11/07/2023   PLT 60.0 (L) 11/07/2023   Lab  Results  Component Value Date   FERRITIN 17 10/06/2023   IRON 83 10/06/2023   TIBC 515 (H) 10/06/2023   UIBC 432 10/06/2023   IRONPCTSAT 16 10/06/2023   Lab Results  Component Value Date   RETICCTPCT 1.9 06/17/2023   RBC 4.44 11/07/2023   No results found for: "KPAFRELGTCHN", "LAMBDASER", "KAPLAMBRATIO" No results found for: "IGGSERUM", "IGA", "IGMSERUM" No results found for: "TOTALPROTELP", "ALBUMINELP", "A1GS", "A2GS", "BETS", "BETA2SER", "GAMS", "MSPIKE", "SPEI"   Chemistry      Component Value Date/Time   NA 138 12/05/2023 1211   NA 127 (L) 02/14/2017 1335   K 3.6 12/05/2023 1211   K 3.6 02/14/2017 1335   CL 95 (L) 12/05/2023 1211   CL 92 (L) 02/14/2017 1335   CO2 35 (H) 12/05/2023 1211   CO2 28 02/14/2017 1335   BUN 15 12/05/2023 1211   BUN 7 (L) 02/14/2017 1335   CREATININE 0.75 12/05/2023 1211   CREATININE 0.47 (L) 02/14/2017 1335   CREATININE 0.56 07/24/2014 1204      Component Value Date/Time   CALCIUM 10.4 (H) 12/05/2023 1211   CALCIUM 9.3 02/14/2017 1335   ALKPHOS 48 12/05/2023 1211   ALKPHOS 68 02/14/2017 1335   AST 20 12/05/2023 1211   ALT 11 12/05/2023 1211   BILITOT 1.0 12/05/2023 1211      Impression and Plan: Ms. 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   I will go ahead and give her a dose of Nplate today.  Again, I am not sure when she is going on see the surgeon for the sebaceous cyst.  I am sure that they will let us know when surgery will be done.  Otherwise, we  will plan to get her back here in May.  We will get her back before Memorial Day.    Josph Macho, MD 4/7/202512:56 PM

## 2023-12-05 NOTE — Patient Instructions (Signed)
 Romiplostim Injection What is this medication? ROMIPLOSTIM (roe mi PLOE stim) treats low levels of platelets in your body caused by immune thrombocytopenia (ITP). It is prescribed when other medications have not worked or cannot be tolerated. It may also be used to help people who have been exposed to high doses of radiation. It works by increasing the amount of platelets in your blood. This lowers the risk of bleeding. This medicine may be used for other purposes; ask your health care provider or pharmacist if you have questions. COMMON BRAND NAME(S): Nplate What should I tell my care team before I take this medication? They need to know if you have any of these conditions: Blood clots Myelodysplastic syndrome An unusual or allergic reaction to romiplostim, mannitol, other medications, foods, dyes, or preservatives Pregnant or trying to get pregnant Breast-feeding How should I use this medication? This medication is injected under the skin. It is given by a care team in a hospital or clinic setting. A special MedGuide will be given to you before each treatment. Be sure to read this information carefully each time. Talk to your care team about the use of this medication in children. While it may be prescribed for children as young as newborns for selected conditions, precautions do apply. Overdosage: If you think you have taken too much of this medicine contact a poison control center or emergency room at once. NOTE: This medicine is only for you. Do not share this medicine with others. What if I miss a dose? Keep appointments for follow-up doses. It is important not to miss your dose. Call your care team if you are unable to keep an appointment. What may interact with this medication? Interactions are not expected. This list may not describe all possible interactions. Give your health care provider a list of all the medicines, herbs, non-prescription drugs, or dietary supplements you use. Also  tell them if you smoke, drink alcohol, or use illegal drugs. Some items may interact with your medicine. What should I watch for while using this medication? Visit your care team for regular checks on your progress. You may need blood work done while you are taking this medication. Your condition will be monitored carefully while you are receiving this medication. It is important not to miss any appointments. What side effects may I notice from receiving this medication? Side effects that you should report to your care team as soon as possible: Allergic reactions--skin rash, itching, hives, swelling of the face, lips, tongue, or throat Blood clot--pain, swelling, or warmth in the leg, shortness of breath, chest pain Side effects that usually do not require medical attention (report to your care team if they continue or are bothersome): Dizziness Joint pain Muscle pain Pain in the hands or feet Stomach pain Trouble sleeping This list may not describe all possible side effects. Call your doctor for medical advice about side effects. You may report side effects to FDA at 1-800-FDA-1088. Where should I keep my medication? This medication is given in a hospital or clinic. It will not be stored at home. NOTE: This sheet is a summary. It may not cover all possible information. If you have questions about this medicine, talk to your doctor, pharmacist, or health care provider.  2024 Elsevier/Gold Standard (2021-12-21 00:00:00)

## 2023-12-07 ENCOUNTER — Other Ambulatory Visit: Payer: Self-pay | Admitting: Hematology & Oncology

## 2023-12-07 DIAGNOSIS — M25559 Pain in unspecified hip: Secondary | ICD-10-CM

## 2023-12-07 DIAGNOSIS — M545 Low back pain, unspecified: Secondary | ICD-10-CM

## 2023-12-13 ENCOUNTER — Other Ambulatory Visit: Payer: Self-pay | Admitting: Family Medicine

## 2023-12-13 DIAGNOSIS — F32A Depression, unspecified: Secondary | ICD-10-CM

## 2023-12-14 ENCOUNTER — Other Ambulatory Visit: Payer: Self-pay | Admitting: Family Medicine

## 2023-12-22 ENCOUNTER — Other Ambulatory Visit: Payer: Self-pay | Admitting: Family Medicine

## 2023-12-22 MED ORDER — HYDROCHLOROTHIAZIDE 25 MG PO TABS: 25.0000 mg | ORAL_TABLET | Freq: Every day | ORAL | 0 refills | Status: DC

## 2023-12-22 NOTE — Telephone Encounter (Signed)
 Copied from CRM [id number]. Topic: Clinical - Prescription Issue >> Dec 22, 2023  9:09 AM Jenice Mitts wrote: Reason for CRM: Patient is calling because her medication was sent to the incorrect pharmacy so she cannot afford the medication. Would like it sent to [facility], [place] - [address] [place] [address] Phone: [phone] Fax: [phone] Hours: Not open 24 hours   She has a deal with them

## 2023-12-28 ENCOUNTER — Telehealth: Payer: Self-pay | Admitting: Family Medicine

## 2023-12-28 NOTE — Telephone Encounter (Signed)
Form has been placed in PCP folder for review.

## 2023-12-28 NOTE — Telephone Encounter (Signed)
 Pt dropped off document to be filled out by provider ([employer] Dis[employer] Parking Placard form 1-page) Pt would like to have document mailed to her when document ready (envelope attached with document). Document put at front office tray under provider.

## 2024-01-10 ENCOUNTER — Ambulatory Visit: Payer: PPO

## 2024-01-16 ENCOUNTER — Inpatient Hospital Stay: Attending: Family

## 2024-01-16 ENCOUNTER — Encounter: Payer: Self-pay | Admitting: Medical Oncology

## 2024-01-16 ENCOUNTER — Inpatient Hospital Stay

## 2024-01-16 ENCOUNTER — Inpatient Hospital Stay: Admitting: Medical Oncology

## 2024-01-16 VITALS — BP 125/76 | HR 84 | Temp 98.0°F | Resp 19 | Ht 66.0 in | Wt 186.0 lb

## 2024-01-16 DIAGNOSIS — D693 Immune thrombocytopenic purpura: Secondary | ICD-10-CM | POA: Diagnosis not present

## 2024-01-16 DIAGNOSIS — K7581 Nonalcoholic steatohepatitis (NASH): Secondary | ICD-10-CM | POA: Insufficient documentation

## 2024-01-16 DIAGNOSIS — D509 Iron deficiency anemia, unspecified: Secondary | ICD-10-CM | POA: Insufficient documentation

## 2024-01-16 DIAGNOSIS — D696 Thrombocytopenia, unspecified: Secondary | ICD-10-CM

## 2024-01-16 DIAGNOSIS — Z79899 Other long term (current) drug therapy: Secondary | ICD-10-CM | POA: Diagnosis not present

## 2024-01-16 DIAGNOSIS — Z7984 Long term (current) use of oral hypoglycemic drugs: Secondary | ICD-10-CM | POA: Diagnosis not present

## 2024-01-16 LAB — CBC WITH DIFFERENTIAL (CANCER CENTER ONLY)
Abs Immature Granulocytes: 0.09 10*3/uL — ABNORMAL HIGH (ref 0.00–0.07)
Basophils Absolute: 0 10*3/uL (ref 0.0–0.1)
Basophils Relative: 1 %
Eosinophils Absolute: 0 10*3/uL (ref 0.0–0.5)
Eosinophils Relative: 0 %
HCT: 36.4 % (ref 36.0–46.0)
Hemoglobin: 12.1 g/dL (ref 12.0–15.0)
Immature Granulocytes: 1 %
Lymphocytes Relative: 19 %
Lymphs Abs: 1.2 10*3/uL (ref 0.7–4.0)
MCH: 27.4 pg (ref 26.0–34.0)
MCHC: 33.2 g/dL (ref 30.0–36.0)
MCV: 82.4 fL (ref 80.0–100.0)
Monocytes Absolute: 0.8 10*3/uL (ref 0.1–1.0)
Monocytes Relative: 12 %
Neutro Abs: 4.3 10*3/uL (ref 1.7–7.7)
Neutrophils Relative %: 67 %
Platelet Count: 63 10*3/uL — ABNORMAL LOW (ref 150–400)
RBC: 4.42 MIL/uL (ref 3.87–5.11)
RDW: 17.2 % — ABNORMAL HIGH (ref 11.5–15.5)
WBC Count: 6.4 10*3/uL (ref 4.0–10.5)
nRBC: 0 % (ref 0.0–0.2)

## 2024-01-16 LAB — CMP (CANCER CENTER ONLY)
ALT: 12 U/L (ref 0–44)
AST: 19 U/L (ref 15–41)
Albumin: 4.9 g/dL (ref 3.5–5.0)
Alkaline Phosphatase: 45 U/L (ref 38–126)
Anion gap: 9 (ref 5–15)
BUN: 18 mg/dL (ref 8–23)
CO2: 33 mmol/L — ABNORMAL HIGH (ref 22–32)
Calcium: 10 mg/dL (ref 8.9–10.3)
Chloride: 96 mmol/L — ABNORMAL LOW (ref 98–111)
Creatinine: 0.76 mg/dL (ref 0.44–1.00)
GFR, Estimated: >60 mL/min (ref >60)
Glucose, Bld: 183 mg/dL — ABNORMAL HIGH (ref 70–99)
Potassium: 3.5 mmol/L (ref 3.5–5.1)
Sodium: 138 mmol/L (ref 135–145)
Total Bilirubin: 1.1 mg/dL (ref 0.0–1.2)
Total Protein: 7.5 g/dL (ref 6.5–8.1)

## 2024-01-16 LAB — SAVE SMEAR(SSMR), FOR PROVIDER SLIDE REVIEW

## 2024-01-16 LAB — LACTATE DEHYDROGENASE: LDH: 131 U/L (ref 98–192)

## 2024-01-16 MED ORDER — ROMIPLOSTIM 125 MCG ~~LOC~~ SOLR
1.0000 ug/kg | Freq: Once | SUBCUTANEOUS | Status: AC
  Administered 2024-01-16: 85 ug via SUBCUTANEOUS
  Filled 2024-01-16: qty 0.17

## 2024-01-16 NOTE — Progress Notes (Signed)
 Hematology and Oncology Follow Up Visit  Rachael Buck [id number] 03/04/1941 83 y.o. 01/16/2024   Principle Diagnosis:  Thrombocytopenia -- NASH/Splenomegaly ; Immune based thrombocytopenia Iron  deficiency   Previous Therapy:  Doptelet  40 mg po q day -- start on 06/23/2023 for dental extraction -now DC'd on 08/15/2023 Doptelet  20 mg po  q day- taken for 5 consecutive days beginning 10 to 13 days prior to scheduled procedure (May 2025)   Current Therapy:        Nplate  as indicated --give for platelet count < 75K IV iron  as indicated  -- Feraheme  given on -07/30/2022      Interim History:  Ms. Eccleston is here today for follow-up.  She had multiple dental extractions back in early December and has dentures being fitted/placed next week. She is hopeful to have more foods that she is able to eat. Currently she is limited to soft foods.   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 She is about to start ABX for this. The plan is for an extraction once platelets are above 80,000.   She otherwise, has had no issues.  She has had no bleeding.  There is been no problems with bowels or bladder.  She has had no nausea or vomiting.  There is been no cough or shortness of breath.  There is been no leg swelling.  Overall, I would have to say that her performance status is ECOG 1.   Wt Readings from Last 3 Encounters:  01/16/24 186 lb 0.6 oz (84.4 kg)  12/05/23 (P) 185 lb (83.9 kg)  11/11/23 184 lb 12.8 oz (83.8 kg)     Medications:  Allergies as of 01/16/2024       Reactions   Codeine  Nausea And Vomiting   Metformin  And Related Diarrhea   Shrimp [shellfish Allergy] Swelling        Medication List        Accurate as of Jan 16, 2024 11:16 AM. If you have any questions, ask your nurse or doctor.          acetaminophen  500 MG tablet Commonly known as: TYLENOL  Take 500 mg by mouth every 6 (six) hours as needed for mild pain.   albuterol  108 (90  Base) MCG/ACT inhaler Commonly known as: ProAir  HFA Inhale 2 puffs into the lungs every 4 (four) hours as needed for wheezing or shortness of breath.   ALPRAZolam  0.5 MG tablet Commonly known as: XANAX  TAKE 1 TABLET(0.5 MG) BY MOUTH THREE TIMES DAILY   bismuth subsalicylate 262 MG/15ML suspension Commonly known as: PEPTO BISMOL Take 30 mLs by mouth every 4 (four) hours as needed.   blood glucose meter kit and supplies Kit Dispense based on patient and insurance preference. Use up to four times daily as directed.   colestipol  1 g tablet Commonly known as: COLESTID  1 g daily.   dapagliflozin  propanediol 10 MG Tabs tablet Commonly known as: Farxiga  Take 1 tablet (10 mg total) by mouth daily before breakfast.   glipiZIDE  5 MG 24 hr tablet Commonly known as: GLUCOTROL  XL TAKE 1 TABLET(5 MG) BY MOUTH DAILY WITH BREAKFAST   hydrochlorothiazide  25 MG tablet Commonly known as: HYDRODIURIL  Take 1 tablet (25 mg total) by mouth daily.   hydrocortisone  2.5 % cream Apply 1 application  topically daily as needed (itchy dry skin).   ipratropium-albuterol  0.5-2.5 (3) MG/3ML Soln Commonly known as: DUONEB Use one 3 ml neb treatment as needed every 6-8 hours prn  loperamide  2 MG capsule Commonly known as: IMODIUM  Take 4-6 mg by mouth daily as needed (for IBS symptoms).   losartan  50 MG tablet Commonly known as: COZAAR  Take 1 tablet (50 mg total) by mouth daily.   OneTouch Delica Lancets Fine Misc 1 each by Other route daily.   OneTouch Ultra test strip Generic drug: glucose blood TEST BLOOD SUGAR(S) ONCE DAILY IN THE MORNING   sertraline  100 MG tablet Commonly known as: ZOLOFT  TAKE 1 AND 1/2 TABLETS(150 MG) BY MOUTH DAILY   simvastatin  80 MG tablet Commonly known as: ZOCOR  TAKE 1 TABLET(80 MG) BY MOUTH DAILY   Stiolto Respimat  2.5-2.5 MCG/ACT Aers Generic drug: Tiotropium Bromide-Olodaterol INHALE 2 PUFFS INTO THE LUNGS DAILY   Stiolto Respimat  2.5-2.5 MCG/ACT  Aers Generic drug: Tiotropium Bromide-Olodaterol Inhale 2 puffs into the lungs daily.   sucralfate 1 g tablet Commonly known as: CARAFATE Take 1 g by mouth 4 (four) times daily as needed.   traMADol  50 MG tablet Commonly known as: ULTRAM  TAKE 1 TABLET(50 MG) BY MOUTH EVERY 6 HOURS AS NEEDED        Allergies:  Allergies  Allergen Reactions   Codeine  Nausea And Vomiting   Metformin  And Related Diarrhea   Shrimp [Shellfish Allergy] Swelling    Past Medical History, Surgical history, Social history, and Family History were reviewed and updated.  Review of Systems: Review of Systems  Constitutional:  Negative for malaise/fatigue.  HENT: Negative.    Eyes: Negative.   Respiratory: Negative.    Cardiovascular: Negative.   Gastrointestinal:  Negative for abdominal pain.  Genitourinary: Negative.   Musculoskeletal: Negative.   Skin: Negative.   Neurological: Negative.   Endo/Heme/Allergies: Negative.   Psychiatric/Behavioral: Negative.     Physical Exam:  height is 5\' 6"  (1.676 m) and weight is 186 lb 0.6 oz (84.4 kg). Her oral temperature is 98 F (36.7 C). Her blood pressure is 125/76 and her pulse is 84. Her respiration is 19 and oxygen  saturation is 96%.   Wt Readings from Last 3 Encounters:  01/16/24 186 lb 0.6 oz (84.4 kg)  12/05/23 (P) 185 lb (83.9 kg)  11/11/23 184 lb 12.8 oz (83.8 kg)    Physical Exam Vitals reviewed.  HENT:     Head: Normocephalic and atraumatic.  Eyes:     Pupils: Pupils are equal, round, and reactive to light.  Cardiovascular:     Rate and Rhythm: Normal rate and regular rhythm.     Heart sounds: Normal heart sounds.  Pulmonary:     Effort: Pulmonary effort is normal.     Breath sounds: Normal breath sounds.  Abdominal:     General: Bowel sounds are normal.     Palpations: Abdomen is soft.  Musculoskeletal:        General: No tenderness or deformity. Normal range of motion.     Cervical back: Normal range of motion.   Lymphadenopathy:     Cervical: No cervical adenopathy.  Skin:    General: Skin is warm and dry.     Findings: No erythema or rash.  Neurological:     Mental Status: She is alert and oriented to person, place, and time.  Psychiatric:        Behavior: Behavior normal.        Thought Content: Thought content normal.        Judgment: Judgment normal.      Lab Results  Component Value Date   WBC 6.4 01/16/2024   HGB 12.1 01/16/2024  HCT 36.4 01/16/2024   MCV 82.4 01/16/2024   PLT 63 (L) 01/16/2024   Lab Results  Component Value Date   FERRITIN 15 12/05/2023   IRON  90 12/05/2023   TIBC 529 (H) 12/05/2023   UIBC 439 12/05/2023   IRONPCTSAT 17 12/05/2023   Lab Results  Component Value Date   RETICCTPCT 1.9 06/17/2023   RBC 4.42 01/16/2024   No results found for: "KPAFRELGTCHN", "LAMBDASER", "KAPLAMBRATIO" No results found for: "IGGSERUM", "IGA", "IGMSERUM" No results found for: "TOTALPROTELP", "ALBUMINELP", "A1GS", "A2GS", "BETS", "BETA2SER", "GAMS", "MSPIKE", "SPEI"   Chemistry      Component Value Date/Time   NA 138 01/16/2024 1033   NA 127 (L) 02/14/2017 1335   K 3.5 01/16/2024 1033   K 3.6 02/14/2017 1335   CL 96 (L) 01/16/2024 1033   CL 92 (L) 02/14/2017 1335   CO2 33 (H) 01/16/2024 1033   CO2 28 02/14/2017 1335   BUN 18 01/16/2024 1033   BUN 7 (L) 02/14/2017 1335   CREATININE 0.76 01/16/2024 1033   CREATININE 0.47 (L) 02/14/2017 1335   CREATININE 0.56 07/24/2014 1204      Component Value Date/Time   CALCIUM  10.0 01/16/2024 1033   CALCIUM  9.3 02/14/2017 1335   ALKPHOS 45 01/16/2024 1033   ALKPHOS 68 02/14/2017 1335   AST 19 01/16/2024 1033   ALT 12 01/16/2024 1033   BILITOT 1.1 01/16/2024 1033     No diagnosis found.  Impression and Plan: Ms. 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   NPLATE  today for Platelet level of 63.  Will need Doptelet  prior to cyst removal. Per UpToDate  Doptelet  taken for 5 consecutive days beginning 10 to 13 days prior to scheduled procedure. This has been sent into her pharmacy.   RTC 2 weeks lab only RTC 4 weeks MD, labs(CBC w/, CMP, LDH), Nplate    Sharla Davis, PA-C 5/19/202511:16 AM

## 2024-01-16 NOTE — Patient Instructions (Signed)
 Romiplostim Injection What is this medication? ROMIPLOSTIM (roe mi PLOE stim) treats low levels of platelets in your body caused by immune thrombocytopenia (ITP). It is prescribed when other medications have not worked or cannot be tolerated. It may also be used to help people who have been exposed to high doses of radiation. It works by increasing the amount of platelets in your blood. This lowers the risk of bleeding. This medicine may be used for other purposes; ask your health care provider or pharmacist if you have questions. COMMON BRAND NAME(S): Nplate What should I tell my care team before I take this medication? They need to know if you have any of these conditions: Blood clots Myelodysplastic syndrome An unusual or allergic reaction to romiplostim, mannitol, other medications, foods, dyes, or preservatives Pregnant or trying to get pregnant Breast-feeding How should I use this medication? This medication is injected under the skin. It is given by a care team in a hospital or clinic setting. A special MedGuide will be given to you before each treatment. Be sure to read this information carefully each time. Talk to your care team about the use of this medication in children. While it may be prescribed for children as young as newborns for selected conditions, precautions do apply. Overdosage: If you think you have taken too much of this medicine contact a poison control center or emergency room at once. NOTE: This medicine is only for you. Do not share this medicine with others. What if I miss a dose? Keep appointments for follow-up doses. It is important not to miss your dose. Call your care team if you are unable to keep an appointment. What may interact with this medication? Interactions are not expected. This list may not describe all possible interactions. Give your health care provider a list of all the medicines, herbs, non-prescription drugs, or dietary supplements you use. Also  tell them if you smoke, drink alcohol, or use illegal drugs. Some items may interact with your medicine. What should I watch for while using this medication? Visit your care team for regular checks on your progress. You may need blood work done while you are taking this medication. Your condition will be monitored carefully while you are receiving this medication. It is important not to miss any appointments. What side effects may I notice from receiving this medication? Side effects that you should report to your care team as soon as possible: Allergic reactions--skin rash, itching, hives, swelling of the face, lips, tongue, or throat Blood clot--pain, swelling, or warmth in the leg, shortness of breath, chest pain Side effects that usually do not require medical attention (report to your care team if they continue or are bothersome): Dizziness Joint pain Muscle pain Pain in the hands or feet Stomach pain Trouble sleeping This list may not describe all possible side effects. Call your doctor for medical advice about side effects. You may report side effects to FDA at 1-800-FDA-1088. Where should I keep my medication? This medication is given in a hospital or clinic. It will not be stored at home. NOTE: This sheet is a summary. It may not cover all possible information. If you have questions about this medicine, talk to your doctor, pharmacist, or health care provider.  2024 Elsevier/Gold Standard (2021-12-21 00:00:00)

## 2024-01-18 ENCOUNTER — Other Ambulatory Visit (HOSPITAL_COMMUNITY): Payer: Self-pay

## 2024-01-18 ENCOUNTER — Telehealth: Payer: Self-pay | Admitting: Pharmacy Technician

## 2024-01-18 ENCOUNTER — Encounter: Payer: Self-pay | Admitting: Hematology & Oncology

## 2024-01-18 MED ORDER — AVATROMBOPAG MALEATE 20 MG PO TABS: ORAL_TABLET | ORAL | 0 refills | Status: DC

## 2024-01-18 NOTE — Telephone Encounter (Signed)
 Oral Oncology Patient Advocate Encounter   Received notification that prior authorization for Doptelet  is required.   PA submitted on 01/18/2024 Key [id number] Status is pending     Patty Benjaman Branch, CPhT Oncology Pharmacy Patient Advocate [facility] Direct Number: [phone] Fax: [phone]

## 2024-01-19 ENCOUNTER — Other Ambulatory Visit: Payer: Self-pay | Admitting: Hematology & Oncology

## 2024-01-19 ENCOUNTER — Telehealth: Payer: Self-pay | Admitting: Pharmacist

## 2024-01-19 ENCOUNTER — Other Ambulatory Visit (HOSPITAL_COMMUNITY): Payer: Self-pay

## 2024-01-19 ENCOUNTER — Encounter: Payer: Self-pay | Admitting: Hematology & Oncology

## 2024-01-19 DIAGNOSIS — D696 Thrombocytopenia, unspecified: Secondary | ICD-10-CM

## 2024-01-19 DIAGNOSIS — M25559 Pain in unspecified hip: Secondary | ICD-10-CM

## 2024-01-19 DIAGNOSIS — M545 Low back pain, unspecified: Secondary | ICD-10-CM

## 2024-01-19 MED ORDER — AVATROMBOPAG MALEATE 20 MG PO TABS: ORAL_TABLET | ORAL | 0 refills | Status: DC

## 2024-01-19 NOTE — Telephone Encounter (Signed)
 Oral Oncology Pharmacist Encounter  Received new prescription for Doptelet  (avatrombopag) for the use prior to a procedure in patient with thrombocytopenia and history of NASH, planned duration 5 days of therapy administered 10-13 days prior to scheduled procedure.  CBC w/ Diff and CMP from 01/16/24 assessed, patient with platelet count of 63 K/UL - discussed with Sunnie England, PA-C and will dose patient at 40 mg daily x 5 days given platelet count is slightly above the 50 K/uL threshold. Prescription updated to reflect this. Prescription dose and frequency assessed for appropriateness.  Current medication list in Epic reviewed, no relevant/significant DDIs with Doptelet  identified.  Evaluated chart and no patient barriers to medication adherence noted.   Due to high, unaffordable copay, will proceed with patient assistance at this time.   Oral Oncology Clinic will continue to follow for initial counseling and start date.  Jude Norton, PharmD, BCPS, BCOP Hematology/Oncology Clinical Pharmacist [facility] Oral Chemotherapy Navigation Clinics [phone] 01/19/2024 11:07 AM

## 2024-01-19 NOTE — Telephone Encounter (Signed)
 Oral Oncology Patient Advocate Encounter  Prior Authorization for Doptelet  has been approved.    PA# [id number] Effective dates: 01/18/2024 through 02/18/2024  Patients co-pay is $1,111.06.    Patty Benjaman Branch, CPhT Oncology Pharmacy Patient Advocate [facility] Direct Number: [phone] Fax: [phone]

## 2024-01-20 ENCOUNTER — Telehealth: Payer: Self-pay

## 2024-01-20 NOTE — Telephone Encounter (Signed)
 Oral Oncology Patient Advocate Encounter   Began application for assistance for Doptelet  through Doptelet  Connect Patient Assistance Program.   Application will be submitted upon completion of necessary supporting documentation.   Doptelet  Connect's phone number [phone].   I will continue to check the status until final determination.    Hansel Ley, CPhT Pharmacy Technician Coordinator [facility] [phone] (Ph) 01/20/2024 9:35 AM

## 2024-01-20 NOTE — Telephone Encounter (Signed)
 Oral Oncology Patient Advocate Encounter   Submitted application for assistance for Doptelet  to Doptelet  Connect Patient Assistance Program.   Application submitted via e-fax to [phone].   Doptelet  Connect's phone number [phone].   I will continue to check the status until final determination.    Hansel Ley, CPhT Pharmacy Technician Coordinator [facility] [phone] (Ph) 01/20/2024 11:52 AM

## 2024-01-25 NOTE — Telephone Encounter (Signed)
 Oral Oncology Patient Advocate Encounter  Application is progressing. No further action is required at this time.  Doptelet  Connect's phone number [phone].   I will continue to check the status until final determination.  Magaline Steinberg (Patty) Benjaman Branch, CPhT  [facility], [place], [place], [place] Oral Chemotherapy Patient Advocate Phone: [phone]  Fax: [phone]

## 2024-01-27 ENCOUNTER — Telehealth: Payer: Self-pay | Admitting: Pharmacy Technician

## 2024-01-27 ENCOUNTER — Other Ambulatory Visit: Payer: Self-pay

## 2024-01-27 ENCOUNTER — Encounter: Payer: Self-pay | Admitting: Hematology & Oncology

## 2024-01-27 DIAGNOSIS — K7581 Nonalcoholic steatohepatitis (NASH): Secondary | ICD-10-CM

## 2024-01-27 DIAGNOSIS — D696 Thrombocytopenia, unspecified: Secondary | ICD-10-CM

## 2024-01-27 NOTE — Telephone Encounter (Signed)
 Oral Oncology Patient Advocate Encounter   Received notification that the application for assistance for Doptelet  through Doptelet  Connect has been approved.   Doptelet  Connect phone number [phone].   Effective dates: 01/27/2024 through 08/29/2024  Medication will be filled at [facility] ([place], [facility]) Psychologist, occupational.  I have spoken to the patient.  Rachael Buck (Rachael Buck) Rachael Buck, CPhT  [facility], [place], [place], [place] Oral Chemotherapy Patient Advocate Phone: [phone]  Fax: [phone]

## 2024-01-30 ENCOUNTER — Inpatient Hospital Stay: Attending: Family

## 2024-01-30 DIAGNOSIS — Z7984 Long term (current) use of oral hypoglycemic drugs: Secondary | ICD-10-CM | POA: Diagnosis not present

## 2024-01-30 DIAGNOSIS — R161 Splenomegaly, not elsewhere classified: Secondary | ICD-10-CM | POA: Diagnosis not present

## 2024-01-30 DIAGNOSIS — D500 Iron deficiency anemia secondary to blood loss (chronic): Secondary | ICD-10-CM | POA: Insufficient documentation

## 2024-01-30 DIAGNOSIS — E876 Hypokalemia: Secondary | ICD-10-CM | POA: Insufficient documentation

## 2024-01-30 DIAGNOSIS — D693 Immune thrombocytopenic purpura: Secondary | ICD-10-CM | POA: Insufficient documentation

## 2024-01-30 DIAGNOSIS — K7581 Nonalcoholic steatohepatitis (NASH): Secondary | ICD-10-CM | POA: Insufficient documentation

## 2024-01-30 DIAGNOSIS — D696 Thrombocytopenia, unspecified: Secondary | ICD-10-CM

## 2024-01-30 LAB — CBC WITH DIFFERENTIAL (CANCER CENTER ONLY)
Abs Immature Granulocytes: 0.05 10*3/uL (ref 0.00–0.07)
Basophils Absolute: 0 10*3/uL (ref 0.0–0.1)
Basophils Relative: 1 %
Eosinophils Absolute: 0 10*3/uL (ref 0.0–0.5)
Eosinophils Relative: 0 %
HCT: 36.1 % (ref 36.0–46.0)
Hemoglobin: 12.1 g/dL (ref 12.0–15.0)
Immature Granulocytes: 1 %
Lymphocytes Relative: 19 %
Lymphs Abs: 1.2 10*3/uL (ref 0.7–4.0)
MCH: 27.3 pg (ref 26.0–34.0)
MCHC: 33.5 g/dL (ref 30.0–36.0)
MCV: 81.3 fL (ref 80.0–100.0)
Monocytes Absolute: 0.7 10*3/uL (ref 0.1–1.0)
Monocytes Relative: 12 %
Neutro Abs: 4.1 10*3/uL (ref 1.7–7.7)
Neutrophils Relative %: 67 %
Platelet Count: 100 10*3/uL — ABNORMAL LOW (ref 150–400)
RBC: 4.44 MIL/uL (ref 3.87–5.11)
RDW: 17.2 % — ABNORMAL HIGH (ref 11.5–15.5)
WBC Count: 6.1 10*3/uL (ref 4.0–10.5)
nRBC: 0 % (ref 0.0–0.2)

## 2024-01-30 LAB — CMP (CANCER CENTER ONLY)
ALT: 13 U/L (ref 0–44)
AST: 21 U/L (ref 15–41)
Albumin: 4.9 g/dL (ref 3.5–5.0)
Alkaline Phosphatase: 48 U/L (ref 38–126)
Anion gap: 9 (ref 5–15)
BUN: 18 mg/dL (ref 8–23)
CO2: 34 mmol/L — ABNORMAL HIGH (ref 22–32)
Calcium: 9.6 mg/dL (ref 8.9–10.3)
Chloride: 96 mmol/L — ABNORMAL LOW (ref 98–111)
Creatinine: 0.8 mg/dL (ref 0.44–1.00)
GFR, Estimated: >60 mL/min (ref >60)
Glucose, Bld: 191 mg/dL — ABNORMAL HIGH (ref 70–99)
Potassium: 3.3 mmol/L — ABNORMAL LOW (ref 3.5–5.1)
Sodium: 139 mmol/L (ref 135–145)
Total Bilirubin: 1 mg/dL (ref 0.0–1.2)
Total Protein: 7.9 g/dL (ref 6.5–8.1)

## 2024-01-30 LAB — LACTATE DEHYDROGENASE: LDH: 139 U/L (ref 98–192)

## 2024-01-30 NOTE — Telephone Encounter (Signed)
 Oral Chemotherapy Pharmacist Encounter  I spoke with patient's daughter, Katherine, for overview of new medication: Doptelet  (avatrombopag) for the use prior to a procedure in patient with thrombocytopenia and history of NASH, planned duration 5 days of therapy administered 10-13 days prior to scheduled procedure.   Counseled on administration, dosing, side effects, monitoring, drug-food interactions, safe handling, storage, and disposal.  Patient will take Doptelet  20mg  tablets, 2 tablets (40 mg) by mouth once daily, taken with food.  Doptelet  start date pending - patient's daughter knows patient will start these tablets 10-13 days prior to procedure.  Side effects include but not limited to: headache, arthralgias, fatigue, bruising,   Reviewed importance of keeping a medication schedule and plan for any missed doses. No barriers to medication adherence identified.  Medication reconciliation performed and medication/allergy list updated.  All questions answered.  Patient's daughter voiced understanding and appreciation.   Medication education handout placed in mail for patient. Patient knows to call the office with questions or concerns. Oral Chemotherapy Clinic phone number provided to patient.   Jude Norton, PharmD, BCPS, BCOP Hematology/Oncology Clinical Pharmacist [facility] Oral Chemotherapy Navigation Clinics [phone] 01/30/2024 10:13 AM

## 2024-02-03 NOTE — Progress Notes (Signed)
 [facility] [address], [address] [place], [place] [address] [phone]  Date:  02/08/2024   Name:  Rachael Buck   DOB:  August 14, 1941   MRN:  [id number]  PCP:  Kaylee Partridge, MD    Chief Complaint: Follow-up (Pt states she has some spots on her back she would like to have looked at before they get infected /Fatigue )   History of Present Illness:  Rachael Buck is a 83 y.o. very pleasant female patient who presents with the following:  Patient seen today for periodic follow-up Most recent visit with myself was just over a year ago History of well-controlled diabetes, hypertension, IBS, Nash, cirrhosis, hyperlipidemia, thrombocytopenia  DR Maria Shiner is following her immune-based thrombocytopenia and iron  deficiency  Her husband died in 02/04/2024Notes from our last visit: Patient seen today for follow-up.  Her blood pressure looks much better with the addition of HCTZ.  She will be having blood work done at hematology in about 3 weeks, will defer checking labs until then Recent A1c showed good control of her diabetes She is feeling more depressed since the loss of her husband earlier this year.  No suicidal ideation.  Will increase sertraline  to 150, I have asked her to let me know how this works for her  Tetanus booster recommended  Colon cancer screening- she does not wish to continue screening  Foot exam is due- update today  Mammogram due- order today  Recommend Shingrix She had some blood work just recently-CMP, CBC  Most recent hemoglobin A1c in March, 7.5- we will defer labs today   Hematology/oncology note on chart from May 19 Principle Diagnosis:  Thrombocytopenia -- NASH/Splenomegaly ; Immune based thrombocytopenia Iron  deficiency  Previous Therapy:  Doptelet  40 mg po q day -- start on 06/23/2023 for dental extraction -now DC'd on 08/15/2023 Doptelet  20 mg po  q day- taken for 5 consecutive  days beginning 10 to 13 days prior to scheduled procedure (May 2025) Current Therapy:        Nplate  as indicated --give for platelet count < 75K IV iron  as indicated  -- Feraheme  given on -07/30/2022    She was given Doptelet  prior to a skin surgery procedure per hematology at last visit due to platelet count of 63k-her plan to treat a couple of sebaceous cyst on her back this month  She has hand pain due to arthritis -she uses Tylenol  as needed Patient Active Problem List   Diagnosis Date Noted   Type 2 diabetes mellitus with hyperglycemia, without long-term current use of insulin  (HCC) 11/07/2023   Hypoxemia 06/24/2022   Pulmonary nodule 1 cm or greater in diameter 01/13/2021   Severe sepsis (HCC) 12/09/2020   Hypokalemia 12/09/2020   Cirrhosis of liver (HCC) 02/17/2018   S/P total knee replacement, 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   Thrombocytopenia (HCC) 02/15/2017   Varicose veins of left lower extremity with complications 11/21/2015   Age-related cognitive decline 06/02/2015   Fatigue 07/19/2014   Orthostatic hypotension 05/14/2013   Nail abnormality 09/04/2012   Bleeding disorder (HCC) 05/08/2012   Varices, 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   NASH (nonalcoholic steatohepatitis) 03/21/2012   HTN (hypertension) 12/21/2011   Depression 03/28/2011   IBS (irritable bowel syndrome) 03/23/2011   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/27/2010   DYSPNEA ON EXERTION 07/27/2010   Uncontrolled diabetes mellitus 07/24/2010   Mixed hyperlipidemia 07/24/2010   Obesity, unspecified 07/24/2010   ALLERGIC  RHINITIS CAUSE UNSPECIFIED 07/24/2010   REFLUX ESOPHAGITIS 07/24/2010   PAIN IN JOINT PELVIC REGION AND THIGH 07/24/2010    Past Medical History:  Diagnosis Date   Anxiety    Arthritis    Asthma    Cirrhosis, nonalcoholic (HCC)    Colon polyps    Diabetes mellitus    Diverticulitis    GERD (gastroesophageal reflux disease)    Hyperlipidemia    Hypertension    NASH  (nonalcoholic steatohepatitis)    Obesity    Pneumonia     Past Surgical History:  Procedure Laterality Date   CHOLECYSTECTOMY  1981   ENDOVENOUS ABLATION SAPHENOUS VEIN W/ LASER Left 02/21/2018   endovenous laser ablation left greater saphenous vein by Merced Stair MD    OOPHORECTOMY  1973   PARTIAL HYSTERECTOMY  1972   ROTATOR CUFF REPAIR     bilateral. 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   Procedure: LEFT TOTAL KNEE ARTHROPLASTY;  Surgeon: Winston Hawking, MD;  Location: [facility] OR;  Service: Orthopedics;  Laterality: Left;   VEIN SURGERY      Social History   Tobacco Use   Smoking status: Former    Current packs/day: 1.50    Average packs/day: 1.5 packs/day for 15.0 years (22.5 ttl pk-yrs)    Types: Cigarettes   Smokeless tobacco: Never   Tobacco comments:    Quit 2002.  Vaping Use   Vaping status: Never Used  Substance Use Topics   Alcohol use: No   Drug use: No    Family History  Problem Relation Age of Onset   Stomach cancer Mother    Diabetes Sister    Breast cancer Daughter 45   Breast cancer Maternal Aunt    Diabetes Brother    Cancer Other    GER disease Other    Obesity Other     Allergies  Allergen Reactions   Codeine  Nausea And Vomiting   Metformin  And Related Diarrhea   Shrimp [Shellfish Allergy] Swelling    Medication list has been reviewed and updated.  Current Outpatient Medications on File Prior to Visit  Medication Sig Dispense Refill   acetaminophen  (TYLENOL ) 500 MG tablet Take 500 mg by mouth every 6 (six) hours as needed for mild pain.     albuterol  (PROAIR  HFA) 108 (90 Base) MCG/ACT inhaler Inhale 2 puffs into the lungs every 4 (four) hours as needed for wheezing or shortness of breath. 1 Inhaler 6   ALPRAZolam  (XANAX ) 0.5 MG tablet TAKE 1 TABLET(0.5 MG) BY MOUTH THREE TIMES DAILY 90 tablet 3   avatrombopag maleate  (DOPTELET ) 20 MG tablet Take 2 tablets (40 mg total) by mouth  daily for 5 consecutive days beginning 10 to 13 days prior to scheduled procedure. Take with food. 10 tablet 0   bismuth subsalicylate (PEPTO BISMOL) 262 MG/15ML suspension Take 30 mLs by mouth every 4 (four) hours as needed.     blood glucose meter kit and supplies KIT Dispense based on patient and insurance preference. Use up to four times daily as directed. 1 each 0   colestipol  (COLESTID ) 1 g tablet 1 g daily.     glipiZIDE  (GLUCOTROL  XL) 5 MG 24 hr tablet TAKE 1 TABLET(5 MG) BY MOUTH DAILY WITH BREAKFAST 90 tablet 3   glucose blood (ONETOUCH ULTRA) test strip TEST BLOOD SUGAR(S) ONCE DAILY IN THE MORNING 100 strip 12   hydrochlorothiazide  (HYDRODIURIL ) 25 MG tablet Take 1 tablet (25 mg total) by  mouth daily. 90 tablet 0   hydrocortisone  2.5 % cream Apply 1 application  topically daily as needed (itchy dry skin).     ipratropium-albuterol  (DUONEB) 0.5-2.5 (3) MG/3ML SOLN Use one 3 ml neb treatment as needed every 6-8 hours prn 360 mL 1   loperamide  (IMODIUM ) 2 MG capsule Take 4-6 mg by mouth daily as needed (for IBS symptoms).     losartan  (COZAAR ) 50 MG tablet Take 1 tablet (50 mg total) by mouth daily. 90 tablet 1   ONETOUCH DELICA LANCETS FINE MISC 1 each by Other route daily. 100 each 12   sertraline  (ZOLOFT ) 100 MG tablet TAKE 1 AND 1/2 TABLETS(150 MG) BY MOUTH DAILY 135 tablet 0   simvastatin  (ZOCOR ) 80 MG tablet TAKE 1 TABLET(80 MG) BY MOUTH DAILY 90 tablet 1   STIOLTO RESPIMAT  2.5-2.5 MCG/ACT AERS INHALE 2 PUFFS INTO THE LUNGS DAILY 4 g 5   sucralfate (CARAFATE) 1 g tablet Take 1 g by mouth 4 (four) times daily as needed.     Tiotropium Bromide-Olodaterol (STIOLTO RESPIMAT ) 2.5-2.5 MCG/ACT AERS Inhale 2 puffs into the lungs daily. 4 g 1   traMADol  (ULTRAM ) 50 MG tablet TAKE 1 TABLET(50 MG) BY MOUTH EVERY 6 HOURS AS NEEDED 60 tablet 0   No current facility-administered medications on file prior to visit.    Review of Systems:  As per HPI- otherwise negative.   Physical  Examination: Vitals:   02/08/24 1034 02/08/24 1102  BP: (!) 142/74 130/70  Pulse: 99   SpO2: 97%    Vitals:   02/08/24 1034  Weight: 186 lb (84.4 kg)  Height: 5' 6 (1.676 m)   Body mass index is 30.02 kg/m. Ideal Body Weight: Weight in (lb) to have BMI = 25: 154.6  GEN: no acute distress.  Older lady, looks well HEENT: Atraumatic, Normocephalic.  Ears and Nose: No external deformity. CV: RRR, No M/G/R. No JVD. No thrill. No extra heart sounds. PULM: CTA B, no wheezes, crackles, rhonchi. No retractions. No resp. distress. No accessory muscle use. EXTR: No c/c/e PSYCH: Normally interactive. Conversant.  Diffuse IP joint changes in her hands bilaterally consistent with osteoarthritis  Assessment and Plan: Primary osteoarthritis of both hands  Controlled type 2 diabetes mellitus without complication, without long-term current use of insulin  (HCC) - Plan: dapagliflozin  propanediol (FARXIGA ) 10 MG TABS tablet, Blood Glucose Monitoring Suppl DEVI, Glucose Blood (BLOOD GLUCOSE TEST STRIPS) STRP, Lancet Device MISC, Lancets Misc. MISC  Encounter for screening mammogram for malignant neoplasm of breast - Plan: MM 3D SCREENING MAMMOGRAM BILATERAL BREAST  Patient seen today for follow-up.  Ordered mammogram Refill Farxiga  and provided a new glucometer for diabetes monitoring.  Her A1c is currently up-to-date  Suggested diclofenac gel for her hands.  At this time she does not particularly want to pursue more aggressive treatment with orthopedics Recommended appropriate immunizations    Signed Gates Kasal, MD

## 2024-02-03 NOTE — Patient Instructions (Addendum)
 It was good to see you again today I would recommend getting a tetanus, RSV vaccine and the shingles series at your pharmacy Also recommend keeping up-to-date on your COVID boosters  I sent your Farxiga  rx to  [facility] [place] - [address] [phone]   Please give them a call and make sure this is the right place!  I was actually able to send your glucose meter and supply rx there as well  You might try some diclofenac gel for your hand pain   Assuming all is well please see me in 4 months

## 2024-02-08 ENCOUNTER — Ambulatory Visit (INDEPENDENT_AMBULATORY_CARE_PROVIDER_SITE_OTHER): Admitting: Family Medicine

## 2024-02-08 ENCOUNTER — Encounter: Payer: Self-pay | Admitting: Family Medicine

## 2024-02-08 VITALS — BP 130/70 | HR 99 | Ht 66.0 in | Wt 186.0 lb

## 2024-02-08 DIAGNOSIS — E119 Type 2 diabetes mellitus without complications: Secondary | ICD-10-CM | POA: Diagnosis not present

## 2024-02-08 DIAGNOSIS — M19041 Primary osteoarthritis, right hand: Secondary | ICD-10-CM

## 2024-02-08 DIAGNOSIS — M19042 Primary osteoarthritis, left hand: Secondary | ICD-10-CM | POA: Diagnosis not present

## 2024-02-08 DIAGNOSIS — Z1231 Encounter for screening mammogram for malignant neoplasm of breast: Secondary | ICD-10-CM | POA: Diagnosis not present

## 2024-02-08 MED ORDER — LANCETS MISC. MISC: 1.0000 | Freq: Three times a day (TID) | 0 refills | Status: AC

## 2024-02-08 MED ORDER — LANCET DEVICE MISC: 1.0000 | Freq: Three times a day (TID) | 0 refills | Status: AC

## 2024-02-08 MED ORDER — BLOOD GLUCOSE TEST VI STRP: 1.0000 | ORAL_STRIP | Freq: Three times a day (TID) | 0 refills | Status: AC

## 2024-02-08 MED ORDER — BLOOD GLUCOSE MONITORING SUPPL DEVI: 1.0000 | Freq: Three times a day (TID) | 0 refills | Status: AC

## 2024-02-08 MED ORDER — DAPAGLIFLOZIN PROPANEDIOL 10 MG PO TABS: 10.0000 mg | ORAL_TABLET | Freq: Every day | ORAL | 3 refills | Status: DC

## 2024-02-11 ENCOUNTER — Other Ambulatory Visit: Payer: Self-pay | Admitting: Family Medicine

## 2024-02-11 DIAGNOSIS — E782 Mixed hyperlipidemia: Secondary | ICD-10-CM

## 2024-02-13 ENCOUNTER — Other Ambulatory Visit: Payer: Self-pay | Admitting: *Deleted

## 2024-02-13 DIAGNOSIS — D696 Thrombocytopenia, unspecified: Secondary | ICD-10-CM

## 2024-02-13 DIAGNOSIS — R0602 Shortness of breath: Secondary | ICD-10-CM

## 2024-02-13 DIAGNOSIS — E782 Mixed hyperlipidemia: Secondary | ICD-10-CM

## 2024-02-13 DIAGNOSIS — R1084 Generalized abdominal pain: Secondary | ICD-10-CM

## 2024-02-13 DIAGNOSIS — R030 Elevated blood-pressure reading, without diagnosis of hypertension: Secondary | ICD-10-CM

## 2024-02-13 DIAGNOSIS — I83892 Varicose veins of left lower extremities with other complications: Secondary | ICD-10-CM

## 2024-02-13 DIAGNOSIS — L720 Epidermal cyst: Secondary | ICD-10-CM | POA: Diagnosis not present

## 2024-02-13 DIAGNOSIS — D699 Hemorrhagic condition, unspecified: Secondary | ICD-10-CM

## 2024-02-13 DIAGNOSIS — E876 Hypokalemia: Secondary | ICD-10-CM

## 2024-02-13 DIAGNOSIS — R635 Abnormal weight gain: Secondary | ICD-10-CM

## 2024-02-13 DIAGNOSIS — D500 Iron deficiency anemia secondary to blood loss (chronic): Secondary | ICD-10-CM

## 2024-02-13 DIAGNOSIS — K7469 Other cirrhosis of liver: Secondary | ICD-10-CM

## 2024-02-13 DIAGNOSIS — K7581 Nonalcoholic steatohepatitis (NASH): Secondary | ICD-10-CM

## 2024-02-14 ENCOUNTER — Encounter: Payer: Self-pay | Admitting: Medical Oncology

## 2024-02-14 ENCOUNTER — Inpatient Hospital Stay

## 2024-02-14 ENCOUNTER — Inpatient Hospital Stay: Admitting: Medical Oncology

## 2024-02-14 VITALS — BP 106/58 | HR 85 | Temp 98.7°F | Resp 18 | Ht 66.0 in | Wt 186.0 lb

## 2024-02-14 DIAGNOSIS — K7469 Other cirrhosis of liver: Secondary | ICD-10-CM

## 2024-02-14 DIAGNOSIS — K7581 Nonalcoholic steatohepatitis (NASH): Secondary | ICD-10-CM

## 2024-02-14 DIAGNOSIS — R635 Abnormal weight gain: Secondary | ICD-10-CM

## 2024-02-14 DIAGNOSIS — R1084 Generalized abdominal pain: Secondary | ICD-10-CM

## 2024-02-14 DIAGNOSIS — D696 Thrombocytopenia, unspecified: Secondary | ICD-10-CM

## 2024-02-14 DIAGNOSIS — D500 Iron deficiency anemia secondary to blood loss (chronic): Secondary | ICD-10-CM

## 2024-02-14 DIAGNOSIS — I83892 Varicose veins of left lower extremities with other complications: Secondary | ICD-10-CM

## 2024-02-14 DIAGNOSIS — D693 Immune thrombocytopenic purpura: Secondary | ICD-10-CM | POA: Diagnosis not present

## 2024-02-14 DIAGNOSIS — R0602 Shortness of breath: Secondary | ICD-10-CM

## 2024-02-14 DIAGNOSIS — R030 Elevated blood-pressure reading, without diagnosis of hypertension: Secondary | ICD-10-CM

## 2024-02-14 DIAGNOSIS — E876 Hypokalemia: Secondary | ICD-10-CM

## 2024-02-14 DIAGNOSIS — D699 Hemorrhagic condition, unspecified: Secondary | ICD-10-CM

## 2024-02-14 DIAGNOSIS — E782 Mixed hyperlipidemia: Secondary | ICD-10-CM

## 2024-02-14 LAB — CBC WITH DIFFERENTIAL (CANCER CENTER ONLY)
Abs Immature Granulocytes: 0.07 10*3/uL (ref 0.00–0.07)
Basophils Absolute: 0 10*3/uL (ref 0.0–0.1)
Basophils Relative: 0 %
Eosinophils Absolute: 0.1 10*3/uL (ref 0.0–0.5)
Eosinophils Relative: 1 %
HCT: 35.8 % — ABNORMAL LOW (ref 36.0–46.0)
Hemoglobin: 12.1 g/dL (ref 12.0–15.0)
Immature Granulocytes: 1 %
Lymphocytes Relative: 20 %
Lymphs Abs: 1.5 10*3/uL (ref 0.7–4.0)
MCH: 27.3 pg (ref 26.0–34.0)
MCHC: 33.8 g/dL (ref 30.0–36.0)
MCV: 80.8 fL (ref 80.0–100.0)
Monocytes Absolute: 0.8 10*3/uL (ref 0.1–1.0)
Monocytes Relative: 11 %
Neutro Abs: 5 10*3/uL (ref 1.7–7.7)
Neutrophils Relative %: 67 %
Platelet Count: 105 10*3/uL — ABNORMAL LOW (ref 150–400)
RBC: 4.43 MIL/uL (ref 3.87–5.11)
RDW: 17.1 % — ABNORMAL HIGH (ref 11.5–15.5)
WBC Count: 7.5 10*3/uL (ref 4.0–10.5)
nRBC: 0 % (ref 0.0–0.2)

## 2024-02-14 LAB — CMP (CANCER CENTER ONLY)
ALT: 11 U/L (ref 0–44)
AST: 21 U/L (ref 15–41)
Albumin: 4.6 g/dL (ref 3.5–5.0)
Alkaline Phosphatase: 53 U/L (ref 38–126)
Anion gap: 11 (ref 5–15)
BUN: 20 mg/dL (ref 8–23)
CO2: 32 mmol/L (ref 22–32)
Calcium: 9.7 mg/dL (ref 8.9–10.3)
Chloride: 95 mmol/L — ABNORMAL LOW (ref 98–111)
Creatinine: 0.8 mg/dL (ref 0.44–1.00)
GFR, Estimated: >60 mL/min (ref >60)
Glucose, Bld: 182 mg/dL — ABNORMAL HIGH (ref 70–99)
Potassium: 3.2 mmol/L — ABNORMAL LOW (ref 3.5–5.1)
Sodium: 138 mmol/L (ref 135–145)
Total Bilirubin: 1 mg/dL (ref 0.0–1.2)
Total Protein: 7.7 g/dL (ref 6.5–8.1)

## 2024-02-14 LAB — IRON AND IRON BINDING CAPACITY (CC-WL,HP ONLY)
Iron: 102 ug/dL (ref 28–170)
Saturation Ratios: 20 % (ref 10.4–31.8)
TIBC: 505 ug/dL — ABNORMAL HIGH (ref 250–450)
UIBC: 403 ug/dL (ref 148–442)

## 2024-02-14 LAB — LACTATE DEHYDROGENASE: LDH: 141 U/L (ref 98–192)

## 2024-02-14 LAB — FERRITIN: Ferritin: 28 ng/mL (ref 11–307)

## 2024-02-14 NOTE — Progress Notes (Signed)
 Hematology and Oncology Follow Up Visit  Rachael Buck [id number] 25-Feb-1941 83 y.o. 02/14/2024   Principle Diagnosis:  Thrombocytopenia -- NASH/Splenomegaly ; Immune based thrombocytopenia Iron  deficiency   Previous Therapy:  Doptelet  40 mg po q day -- start on 06/23/2023 for dental extraction -now DC'd on 08/15/2023 Doptelet  20 mg po  q day- taken for 5 consecutive days beginning 10 to 13 days prior to scheduled procedure (May 2025)   Current Therapy:        Nplate  as indicated --give for platelet count < 75K IV iron  as indicated  -- Feraheme  given on -07/30/2022      Interim History:  Rachael Buck is here today for follow-up.  Today she reports that she is overall ok. Still has chronic fatigue.   Since our last visit she had her sebaceus cyst drained. This was performed yesterday. She is now on keflex .   She otherwise, has had no issues.  She has had no bleeding.  There is been no problems with bowels or bladder.  She has had no nausea or vomiting.  There is been no cough or shortness of breath.  There is been no leg swelling.  Overall, I would have to say that her performance status is ECOG 1.   Wt Readings from Last 3 Encounters:  02/14/24 186 lb (84.4 kg)  02/08/24 186 lb (84.4 kg)  01/16/24 186 lb 0.6 oz (84.4 kg)   Medications:  Allergies as of 02/14/2024       Reactions   Codeine  Nausea And Vomiting   Metformin  And Related Diarrhea   Shrimp [shellfish Allergy] Swelling        Medication List        Accurate as of February 14, 2024 10:14 AM. If you have any questions, ask your nurse or doctor.          acetaminophen  500 MG tablet Commonly known as: TYLENOL  Take 500 mg by mouth every 6 (six) hours as needed for mild pain.   albuterol  108 (90 Base) MCG/ACT inhaler Commonly known as: ProAir  HFA Inhale 2 puffs into the lungs every 4 (four) hours as needed for wheezing or shortness of breath.   ALPRAZolam  0.5 MG tablet Commonly known as: XANAX  TAKE 1  TABLET(0.5 MG) BY MOUTH THREE TIMES DAILY   avatrombopag maleate  20 MG tablet Commonly known as: DOPTELET  Take 2 tablets (40 mg total) by mouth daily for 5 consecutive days beginning 10 to 13 days prior to scheduled procedure. Take with food.   bismuth subsalicylate 262 MG/15ML suspension Commonly known as: PEPTO BISMOL Take 30 mLs by mouth every 4 (four) hours as needed.   blood glucose meter kit and supplies Kit Dispense based on patient and insurance preference. Use up to four times daily as directed.   Blood Glucose Monitoring Suppl Devi 1 each by Does not apply route in the morning, at noon, and at bedtime. May substitute to any manufacturer covered by patient's insurance.   cephALEXin  500 MG capsule Commonly known as: KEFLEX  Take 500 mg by mouth 2 (two) times daily.   colestipol  1 g tablet Commonly known as: COLESTID  1 g daily.   dapagliflozin  propanediol 10 MG Tabs tablet Commonly known as: Farxiga  Take 1 tablet (10 mg total) by mouth daily before breakfast.   glipiZIDE  5 MG 24 hr tablet Commonly known as: GLUCOTROL  XL TAKE 1 TABLET(5 MG) BY MOUTH DAILY WITH BREAKFAST   hydrochlorothiazide  25 MG tablet Commonly known as: HYDRODIURIL  Take 1 tablet (25 mg total)  by mouth daily.   hydrocortisone  2.5 % cream Apply 1 application  topically daily as needed (itchy dry skin).   ipratropium-albuterol  0.5-2.5 (3) MG/3ML Soln Commonly known as: DUONEB Use one 3 ml neb treatment as needed every 6-8 hours prn   Lancet Device Misc 1 each by Does not apply route in the morning, at noon, and at bedtime. May substitute to any manufacturer covered by patient's insurance.   Lancets Misc. Misc 1 each by Does not apply route in the morning, at noon, and at bedtime. May substitute to any manufacturer covered by patient's insurance.   loperamide  2 MG capsule Commonly known as: IMODIUM  Take 4-6 mg by mouth daily as needed (for IBS symptoms).   losartan  50 MG tablet Commonly known  as: COZAAR  Take 1 tablet (50 mg total) by mouth daily.   OneTouch Delica Lancets Fine Misc 1 each by Other route daily.   OneTouch Ultra test strip Generic drug: glucose blood TEST BLOOD SUGAR(S) ONCE DAILY IN THE MORNING   BLOOD GLUCOSE TEST STRIPS Strp 1 each by In Vitro route in the morning, at noon, and at bedtime. May substitute to any manufacturer covered by patient's insurance.   sertraline  100 MG tablet Commonly known as: ZOLOFT  TAKE 1 AND 1/2 TABLETS(150 MG) BY MOUTH DAILY   simvastatin  80 MG tablet Commonly known as: ZOCOR  Take 1 tablet (80 mg total) by mouth daily.   Stiolto Respimat  2.5-2.5 MCG/ACT Aers Generic drug: Tiotropium Bromide-Olodaterol INHALE 2 PUFFS INTO THE LUNGS DAILY   Stiolto Respimat  2.5-2.5 MCG/ACT Aers Generic drug: Tiotropium Bromide-Olodaterol Inhale 2 puffs into the lungs daily.   sucralfate 1 g tablet Commonly known as: CARAFATE Take 1 g by mouth 4 (four) times daily as needed.   traMADol  50 MG tablet Commonly known as: ULTRAM  TAKE 1 TABLET(50 MG) BY MOUTH EVERY 6 HOURS AS NEEDED        Allergies:  Allergies  Allergen Reactions   Codeine  Nausea And Vomiting   Metformin  And Related Diarrhea   Shrimp [Shellfish Allergy] Swelling    Past Medical History, Surgical history, Social history, and Family History were reviewed and updated.  Review of Systems: Review of Systems  Constitutional:  Negative for malaise/fatigue.  HENT: Negative.    Eyes: Negative.   Respiratory: Negative.    Cardiovascular: Negative.   Gastrointestinal:  Negative for abdominal pain.  Genitourinary: Negative.   Musculoskeletal: Negative.   Skin: Negative.   Neurological: Negative.   Endo/Heme/Allergies: Negative.   Psychiatric/Behavioral: Negative.     Physical Exam:  height is 5' 6 (1.676 m) and weight is 186 lb (84.4 kg). Her oral temperature is 98.7 F (37.1 C). Her blood pressure is 106/58 (abnormal) and her pulse is 85. Her respiration is 18  and oxygen  saturation is 98%.   Wt Readings from Last 3 Encounters:  02/14/24 186 lb (84.4 kg)  02/08/24 186 lb (84.4 kg)  01/16/24 186 lb 0.6 oz (84.4 kg)    Physical Exam Vitals reviewed.  HENT:     Head: Normocephalic and atraumatic.   Eyes:     Pupils: Pupils are equal, round, and reactive to light.    Cardiovascular:     Rate and Rhythm: Normal rate and regular rhythm.     Heart sounds: Normal heart sounds.  Pulmonary:     Effort: Pulmonary effort is normal.     Breath sounds: Normal breath sounds.  Abdominal:     General: Bowel sounds are normal.     Palpations: Abdomen  is soft.   Musculoskeletal:        General: No tenderness or deformity. Normal range of motion.     Cervical back: Normal range of motion.  Lymphadenopathy:     Cervical: No cervical adenopathy.   Skin:    General: Skin is warm and dry.     Findings: No erythema or rash.   Neurological:     Mental Status: She is alert and oriented to person, place, and time.   Psychiatric:        Behavior: Behavior normal.        Thought Content: Thought content normal.        Judgment: Judgment normal.      Lab Results  Component Value Date   WBC 6.1 01/30/2024   HGB 12.1 01/30/2024   HCT 36.1 01/30/2024   MCV 81.3 01/30/2024   PLT 100 (L) 01/30/2024   Lab Results  Component Value Date   FERRITIN 15 12/05/2023   IRON  90 12/05/2023   TIBC 529 (H) 12/05/2023   UIBC 439 12/05/2023   IRONPCTSAT 17 12/05/2023   Lab Results  Component Value Date   RETICCTPCT 1.9 06/17/2023   RBC 4.44 01/30/2024   No results found for: KPAFRELGTCHN, LAMBDASER, KAPLAMBRATIO No results found for: IGGSERUM, IGA, IGMSERUM No results found for: Hobson Luna, SPEI   Chemistry      Component Value Date/Time   NA 139 01/30/2024 1059   NA 127 (L) 02/14/2017 1335   K 3.3 (L) 01/30/2024 1059   K 3.6 02/14/2017 1335   CL 96 (L)  01/30/2024 1059   CL 92 (L) 02/14/2017 1335   CO2 34 (H) 01/30/2024 1059   CO2 28 02/14/2017 1335   BUN 18 01/30/2024 1059   BUN 7 (L) 02/14/2017 1335   CREATININE 0.80 01/30/2024 1059   CREATININE 0.47 (L) 02/14/2017 1335   CREATININE 0.56 07/24/2014 1204      Component Value Date/Time   CALCIUM  9.6 01/30/2024 1059   CALCIUM  9.3 02/14/2017 1335   ALKPHOS 48 01/30/2024 1059   ALKPHOS 68 02/14/2017 1335   AST 21 01/30/2024 1059   ALT 13 01/30/2024 1059   BILITOT 1.0 01/30/2024 1059     Encounter Diagnoses  Name Primary?   Thrombocytopenia (HCC) Yes   Iron  deficiency anemia due to chronic blood loss    NASH (nonalcoholic steatohepatitis)     Impression and Plan: Ms. 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   No NPLATE  today for Platelet level of 105 Has chronic mild hypokalemia with a value of 3.2 today- she will try to increase her potassium intake.    RTC 4 weeks MD, labs(CBC w/, CMP, LDH), Nplate    Sharla Davis, PA-C 6/17/202510:14 AM

## 2024-02-14 NOTE — Progress Notes (Signed)
 Platelet count 105. Per Sunnie England, PA-C, patient does not need Nplate  injection. Patient updated on POC and verbalized understanding. Patient discharged alert and ambulatory.

## 2024-02-15 ENCOUNTER — Ambulatory Visit (INDEPENDENT_AMBULATORY_CARE_PROVIDER_SITE_OTHER): Admitting: *Deleted

## 2024-02-15 ENCOUNTER — Telehealth: Payer: Self-pay | Admitting: *Deleted

## 2024-02-15 VITALS — Ht 66.0 in | Wt 186.0 lb

## 2024-02-15 DIAGNOSIS — Z0000 Encounter for general adult medical examination without abnormal findings: Secondary | ICD-10-CM | POA: Diagnosis not present

## 2024-02-15 NOTE — Telephone Encounter (Signed)
 I did pt's AWV this morning. EPIC says she is due for foot exam. Pt said it was done at last visit. Are you able to addend last OV to completely document the foot exam.  If not, I told pt she may need to repeat it at OV in October.  Please advise.

## 2024-02-15 NOTE — Telephone Encounter (Signed)
 Ok, I have added a note to her upcoming appt to do foot exam.

## 2024-02-15 NOTE — Patient Instructions (Signed)
 Rachael Buck , Thank you for taking time out of your busy schedule to complete your Annual Wellness Visit with me. I enjoyed our conversation and look forward to speaking with you again next year. I, as well as your care team,  appreciate your ongoing commitment to your health goals. Please review the following plan we discussed and let me know if I can assist you in the future. Your Game plan/ To Do List    Follow up Visits: Next Medicare AWV with our clinical staff: 02/15/25 11am   Next Office Visit with your provider: 06/11/24 11am  Clinician Recommendations:  Aim for 30 minutes of exercise or brisk walking, 6-8 glasses of water, and 5 servings of fruits and vegetables each day.       This is a list of the screening recommended for you and due dates:  Health Maintenance  Topic Date Due   Zoster (Shingles) Vaccine (1 of 2) Never done   DTaP/Tdap/Td vaccine (2 - Tdap) 08/30/2017   Complete foot exam   10/29/2021   Medicare Annual Wellness Visit  01/04/2024   COVID-19 Vaccine (5 - 2024-25 season) 02/14/2025*   Flu Shot  03/30/2024   Hemoglobin A1C  05/09/2024   Eye exam for diabetics  10/16/2024   Yearly kidney health urinalysis for diabetes  11/06/2024   Yearly kidney function blood test for diabetes  02/13/2025   Pneumococcal Vaccine for age over 77  Completed   DEXA scan (bone density measurement)  Completed   HPV Vaccine  Aged Out   Meningitis B Vaccine  Aged Out   Screening for Lung Cancer  Discontinued   Colon Cancer Screening  Discontinued   Hepatitis C Screening  Discontinued  *Topic was postponed. The date shown is not the original due date.    Advanced directives: (Copy Requested) Please bring a copy of your health care power of attorney and living will to the office to be added to your chart at your convenience. You can mail to [facility] [address] [place], [place] [address] or email to [email or web address] Advance Care Planning is important  because it:  If you need assistance in completing these, please reach out to us  so that we can help you!  See attachments for Exercise and balance Tips.

## 2024-02-15 NOTE — Addendum Note (Signed)
 Addended by: Gates Kasal C on: 02/15/2024 05:49 PM   Modules accepted: Orders

## 2024-02-15 NOTE — Telephone Encounter (Signed)
 I also forgot to mention that her medication list has Farxiga  and Glucotrol  on it.  It looked like glucotrol  was an older RX. Pt said she is only taking Farxiga .  Please advise if pt is supposed to be taking both and I will update the med list.

## 2024-02-15 NOTE — Progress Notes (Signed)
 Please attest this visit in the absence of patient primary care provider.    Subjective:   Rachael Buck is a 83 y.o. who presents for a Medicare Wellness preventive visit.  As a reminder, Annual Wellness Visits don't include a physical exam, and some assessments may be limited, especially if this visit is performed virtually. We may recommend an in-person follow-up visit with your provider if needed.  Visit Complete: Virtual I connected with  Rachael Buck on 02/15/24 by a audio enabled telemedicine application and verified that I am speaking with the correct person using two identifiers.  Patient Location: Home  Provider Location: Office/Clinic  I discussed the limitations of evaluation and management by telemedicine. The patient expressed understanding and agreed to proceed.  Vital Signs: Because this visit was a virtual/telehealth visit, some criteria may be missing or patient reported. Any vitals not documented were not able to be obtained and vitals that have been documented are patient reported.  VideoDeclined- This patient declined Librarian, academic. Therefore the visit was completed with audio only.  Persons Participating in Visit: Patient.  AWV Questionnaire: No: Patient Medicare AWV questionnaire was not completed prior to this visit.  Cardiac Risk Factors include: advanced age (>73men, >27 women);diabetes mellitus;hypertension;dyslipidemia     Objective:    Today's Vitals   02/15/24 1043  Weight: 186 lb (84.4 kg)  Height: 5' 6 (1.676 m)   Body mass index is 30.02 kg/m.     02/15/2024   11:13 AM 02/14/2024   10:02 AM 01/16/2024   10:52 AM 12/05/2023   12:28 PM 10/06/2023    2:39 PM 08/18/2023   12:42 PM 07/14/2023    2:38 PM  Advanced Directives  Does Patient Have a Medical Advance Directive? Yes Yes Yes Yes Yes Yes Yes  Type of Estate agent of [place];Living will Healthcare Power of [place];Living will  Healthcare Power of [place];Living will Living will Healthcare Power of [place];Living will Healthcare Power of [place];Living will Healthcare Power of [place];Living will  Does patient want to make changes to medical advance directive? No - Patient declined No - Patient declined No - Patient declined No - Patient declined No - Patient declined    Copy of Healthcare Power of Attorney in Chart? No - copy requested No - copy requested No - copy requested  No - copy requested No - copy requested No - copy requested  Would patient like information on creating a medical advance directive? No - Patient declined No - Patient declined No - Patient declined  No - Patient declined No - Patient declined No - Patient declined    Current Medications (verified) Outpatient Encounter Medications as of 02/15/2024  Medication Sig   acetaminophen  (TYLENOL ) 500 MG tablet Take 500 mg by mouth every 6 (six) hours as needed for mild pain.   albuterol  (PROAIR  HFA) 108 (90 Base) MCG/ACT inhaler Inhale 2 puffs into the lungs every 4 (four) hours as needed for wheezing or shortness of breath.   ALPRAZolam  (XANAX ) 0.5 MG tablet TAKE 1 TABLET(0.5 MG) BY MOUTH THREE TIMES DAILY   avatrombopag maleate  (DOPTELET ) 20 MG tablet Take 2 tablets (40 mg total) by mouth daily for 5 consecutive days beginning 10 to 13 days prior to scheduled procedure. Take with food.   bismuth subsalicylate (PEPTO BISMOL) 262 MG/15ML suspension Take 30 mLs by mouth every 4 (four) hours as needed.   Blood Glucose Monitoring Suppl DEVI 1 each by Does not apply route in the morning,  at noon, and at bedtime. May substitute to any manufacturer covered by patient's insurance.   cephALEXin  (KEFLEX ) 500 MG capsule Take 500 mg by mouth 2 (two) times daily.   colestipol  (COLESTID ) 1 g tablet 1 g daily.   dapagliflozin  propanediol (FARXIGA ) 10 MG TABS tablet Take 1 tablet (10 mg total) by mouth daily before breakfast.   Glucose Blood (BLOOD GLUCOSE TEST STRIPS)  STRP 1 each by In Vitro route in the morning, at noon, and at bedtime. May substitute to any manufacturer covered by patient's insurance.   hydrochlorothiazide  (HYDRODIURIL ) 25 MG tablet Take 1 tablet (25 mg total) by mouth daily.   hydrocortisone  2.5 % cream Apply 1 application  topically daily as needed (itchy dry skin).   ipratropium-albuterol  (DUONEB) 0.5-2.5 (3) MG/3ML SOLN Use one 3 ml neb treatment as needed every 6-8 hours prn   Lancets Misc. MISC 1 each by Does not apply route in the morning, at noon, and at bedtime. May substitute to any manufacturer covered by patient's insurance.   loperamide  (IMODIUM ) 2 MG capsule Take 4-6 mg by mouth daily as needed (for IBS symptoms).   losartan  (COZAAR ) 50 MG tablet Take 1 tablet (50 mg total) by mouth daily.   ONETOUCH DELICA LANCETS FINE MISC 1 each by Other route daily.   sertraline  (ZOLOFT ) 100 MG tablet TAKE 1 AND 1/2 TABLETS(150 MG) BY MOUTH DAILY   simvastatin  (ZOCOR ) 80 MG tablet Take 1 tablet (80 mg total) by mouth daily.   STIOLTO RESPIMAT  2.5-2.5 MCG/ACT AERS INHALE 2 PUFFS INTO THE LUNGS DAILY   Tiotropium Bromide-Olodaterol (STIOLTO RESPIMAT ) 2.5-2.5 MCG/ACT AERS Inhale 2 puffs into the lungs daily.   traMADol  (ULTRAM ) 50 MG tablet TAKE 1 TABLET(50 MG) BY MOUTH EVERY 6 HOURS AS NEEDED   blood glucose meter kit and supplies KIT Dispense based on patient and insurance preference. Use up to four times daily as directed.   glipiZIDE  (GLUCOTROL  XL) 5 MG 24 hr tablet TAKE 1 TABLET(5 MG) BY MOUTH DAILY WITH BREAKFAST (Patient not taking: Reported on 02/15/2024)   glucose blood (ONETOUCH ULTRA) test strip TEST BLOOD SUGAR(S) ONCE DAILY IN THE MORNING   Lancet Device MISC 1 each by Does not apply route in the morning, at noon, and at bedtime. May substitute to any manufacturer covered by patient's insurance.   sucralfate (CARAFATE) 1 g tablet Take 1 g by mouth 4 (four) times daily as needed.   No facility-administered encounter medications on  file as of 02/15/2024.    Allergies (verified) Codeine , Metformin  and related, and Shrimp [shellfish allergy]   History: Past Medical History:  Diagnosis Date   Anxiety    Arthritis    Asthma    Cirrhosis, nonalcoholic (HCC)    Colon polyps    Diabetes mellitus    Diverticulitis    GERD (gastroesophageal reflux disease)    Hyperlipidemia    Hypertension    NASH (nonalcoholic steatohepatitis)    Obesity    Pneumonia    Past Surgical History:  Procedure Laterality Date   CHOLECYSTECTOMY  1981   ENDOVENOUS ABLATION SAPHENOUS VEIN W/ LASER Left 02/21/2018   endovenous laser ablation left greater saphenous vein by Merced Stair MD    OOPHORECTOMY  1973   PARTIAL HYSTERECTOMY  1972   ROTATOR CUFF REPAIR     bilateral. 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   Procedure: LEFT TOTAL KNEE ARTHROPLASTY;  Surgeon: Winston Hawking, MD;  Location: [facility] OR;  Service: Orthopedics;  Laterality: Left;   VEIN SURGERY     Family History  Problem Relation Age of Onset   Stomach cancer Mother    Diabetes Sister    Breast cancer Daughter 59   Breast cancer Maternal Aunt    Diabetes Brother    Cancer Other    GER disease Other    Obesity Other    Social History   Socioeconomic History   Marital status: Widowed    Spouse name: Not on file   Number of children: Not on file   Years of education: Not on file   Highest education level: Not on file  Occupational History   Not on file  Tobacco Use   Smoking status: Former    Average packs/day: 1.5 packs/day for 20.0 years (30.0 ttl pk-yrs)    Types: Cigarettes    Start date: 51   Smokeless tobacco: Never   Tobacco comments:    Quit 2002.  Vaping Use   Vaping status: Never Used  Substance and Sexual Activity   Alcohol use: No   Drug use: No   Sexual activity: Not Currently  Other Topics Concern   Not on file  Social History Narrative   Not on file   Social Drivers of  Health   Financial Resource Strain: Low Risk  (02/15/2024)   Overall Financial Resource Strain (CARDIA)    Difficulty of Paying Living Expenses: Not very hard  Food Insecurity: No Food Insecurity (02/15/2024)   Hunger Vital Sign    Worried About Running Out of Food in the Last Year: Never true    Ran Out of Food in the Last Year: Never true  Transportation Needs: No Transportation Needs (02/15/2024)   PRAPARE - Administrator, Civil Service (Medical): No    Lack of Transportation (Non-Medical): No  Physical Activity: Inactive (02/15/2024)   Exercise Vital Sign    Days of Exercise per Week: 0 days    Minutes of Exercise per Session: 0 min  Stress: No Stress Concern Present (02/15/2024)   [employer] of Occupational Health - Occupational Stress Questionnaire    Feeling of Stress: Only a little  Social Connections: Moderately Integrated (02/15/2024)   Social Connection and Isolation Panel    Frequency of Communication with Friends and Family: More than three times a week    Frequency of Social Gatherings with Friends and Family: Twice a week    Attends Religious Services: More than 4 times per year    Active Member of [employer] or Organizations: Yes    Attends Banker Meetings: More than 4 times per year    Marital Status: Widowed    Tobacco Counseling Counseling given: Not Answered Tobacco comments: Quit 2002.    Clinical Intake:  Pre-visit preparation completed: Yes        BMI - recorded: 30.02 Nutritional Status: BMI 25 -29 Overweight Nutritional Risks: None Diabetes: Yes CBG done?: No Did pt. bring in CBG monitor from home?: No  Lab Results  Component Value Date   HGBA1C 7.5 (H) 11/07/2023   HGBA1C 6.3 (H) 10/07/2022   HGBA1C 6.9 (H) 04/01/2021     How often do you need to have someone help you when you read instructions, pamphlets, or other written materials from your doctor or pharmacy?: 1 - Never What is the last grade level you  completed in school?: High School  Interpreter Needed?: No  Information entered by :: Shields Pautz, CMA   Activities of Daily  Living     02/15/2024   10:49 AM  In your present state of health, do you have any difficulty performing the following activities:  Hearing? 1  Comment bilateral hearing aids  Vision? 0  Difficulty concentrating or making decisions? 0  Walking or climbing stairs? 0  Dressing or bathing? 0  Doing errands, shopping? 0  Preparing Food and eating ? N  Using the Toilet? N  In the past six months, have you accidently leaked urine? N  Do you have problems with loss of bowel control? N  Managing your Medications? N  Managing your Finances? N  Housekeeping or managing your Housekeeping? N    Patient Care Team: Copland, Skipper Dumas, MD as PCP - General (Family Medicine) Ozell Blunt, MD as Consulting Physician (Gastroenterology) Pauline Bos, OD as Referring Physician (Optometry) Winston Hawking, MD as Consulting Physician (Orthopedic Surgery) Harlen Lick, MD as Consulting Physician (Dermatology) Cecilie Coffee, RPH-CPP (Pharmacist)  I have updated your Care Teams any recent Medical Services you may have received from other providers in the past year.     Assessment:   This is a routine wellness examination for [place].  Hearing/Vision screen Hearing Screening - Comments:: Wears hearing aids. Vision Screening - Comments:: Last eye exam 10/17/23 with [facility] ophthalmology   Goals Addressed   None    Depression Screen     02/15/2024   11:17 AM 02/08/2024   10:39 AM 01/04/2023    1:48 PM 12/29/2021    1:54 PM 09/20/2017   10:03 AM 09/20/2017    9:27 AM 06/17/2017    9:33 AM  PHQ 2/9 Scores  PHQ - 2 Score 2 0 1 0 2 2 0  PHQ- 9 Score 5    8 8  0    Fall Risk     02/15/2024   11:01 AM 01/04/2023    1:48 PM 12/29/2021    1:56 PM 10/29/2020    2:22 PM 07/25/2019   10:39 AM  Fall Risk   Falls in the past year? 1 0 0 1 1   Comment     Emmi Telephone  Survey: data to providers prior to load   Number falls in past yr: 1 0 0 1 1   Comment     Emmi Telephone Survey Actual Response = 1   Injury with Fall? 0 0 0 0 0  Risk for fall due to : History of fall(s);Impaired balance/gait No Fall Risks Impaired vision;Impaired balance/gait    Follow up Falls evaluation completed;Education provided Falls evaluation completed Falls prevention discussed        Data saved with a previous flowsheet row definition    MEDICARE RISK AT HOME:  Medicare Risk at Home Any stairs in or around the home?: Yes If so, are there any without handrails?: No Home free of loose throw rugs in walkways, pet beds, electrical cords, etc?: Yes Adequate lighting in your home to reduce risk of falls?: Yes Life alert?: No Use of a cane, walker or w/c?: Yes (uses cane) Grab bars in the bathroom?: Yes Shower chair or bench in shower?: Yes Elevated toilet seat or a handicapped toilet?: Yes  TIMED UP AND GO:  Was the test performed?  No, audio Cognitive Function: 6CIT completed    09/20/2017    9:33 AM  MMSE - Mini Mental State Exam  Orientation to time 5   Orientation to Place 5   Registration 3   Attention/ Calculation 5   Recall 1   Language-  name 2 objects 2   Language- repeat 1  Language- follow 3 step command 3   Language- read & follow direction 1   Write a sentence 1   Copy design 1   Total score 28      Data saved with a previous flowsheet row definition        02/15/2024   11:15 AM 01/04/2023    1:59 PM 12/29/2021    1:57 PM  6CIT Screen  What Year? 0 points 0 points 0 points  What month? 0 points 0 points 0 points  What time? 0 points 0 points 0 points  Count back from 20 0 points 0 points 0 points  Months in reverse 0 points 0 points 0 points  Repeat phrase 0 points 2 points 0 points  Total Score 0 points 2 points 0 points    Immunizations Immunization History  Administered Date(s) Administered   Fluad Quad(high Dose 65+) 10/29/2020,  06/17/2021, 06/09/2022   Hepatitis B 08/10/2012, 09/11/2012, 11/23/2012   Influenza Split 06/08/2010   Influenza Whole 05/30/2010   Influenza, High Dose Seasonal PF 05/13/2015, 06/07/2018   Influenza,inj,Quad PF,6+ Mos 08/24/2016, 06/17/2017, 06/06/2019   Moderna Sars-Covid-2 Vaccination 09/11/2019, 10/09/2019, 05/30/2020, 08/01/2020   Pneumococcal Conjugate-13 02/11/2015   Pneumococcal Polysaccharide-23 05/30/2008   Td 08/31/2007    Screening Tests Health Maintenance  Topic Date Due   Zoster Vaccines- Shingrix (1 of 2) Never done   DTaP/Tdap/Td (2 - Tdap) 08/30/2017   FOOT EXAM  10/29/2021   Medicare Annual Wellness (AWV)  01/04/2024   COVID-19 Vaccine (5 - 2024-25 season) 02/14/2025 (Originally 05/01/2023)   INFLUENZA VACCINE  03/30/2024   HEMOGLOBIN A1C  05/09/2024   OPHTHALMOLOGY EXAM  10/16/2024   Diabetic kidney evaluation - Urine ACR  11/06/2024   Diabetic kidney evaluation - eGFR measurement  02/13/2025   Pneumococcal Vaccine: 50+ Years  Completed   DEXA SCAN  Completed   HPV VACCINES  Aged Out   Meningococcal B Vaccine  Aged Out   Lung Cancer Screening  Discontinued   Colonoscopy  Discontinued   Hepatitis C Screening  Discontinued    Health Maintenance  Health Maintenance Due  Topic Date Due   Zoster Vaccines- Shingrix (1 of 2) Never done   DTaP/Tdap/Td (2 - Tdap) 08/30/2017   FOOT EXAM  10/29/2021   Medicare Annual Wellness (AWV)  01/04/2024   Health Maintenance Items Addressed: Pt declines further COVID vaccine. Will get tetanus and shingles vaccines at pharmacy.    Additional Screening:  Vision Screening: Recommended annual ophthalmology exams for early detection of glaucoma and other disorders of the eye. Would you like a referral to an eye doctor? No    Dental Screening: Recommended annual dental exams for proper oral hygiene  Community Resource Referral / Chronic Care Management: CRR required this visit?  No   CCM required this visit?   No   Plan:    I have personally reviewed and noted the following in the patient's chart:   Medical and social history Use of alcohol, tobacco or illicit drugs  Current medications and supplements including opioid prescriptions. Patient is not currently taking opioid prescriptions. Functional ability and status Nutritional status Physical activity Advanced directives List of other physicians Hospitalizations, surgeries, and ER visits in previous 12 months Vitals Screenings to include cognitive, depression, and falls Referrals and appointments  In addition, I have reviewed and discussed with patient certain preventive protocols, quality metrics, and best practice recommendations. A written personalized care plan for preventive  services as well as general preventive health recommendations were provided to patient.   Susa Engman, CMA   02/15/2024   After Visit Summary: (MyChart) Due to this being a telephonic visit, the after visit summary with patients personalized plan was offered to patient via MyChart   Notes: Nothing significant to report at this time.

## 2024-02-16 ENCOUNTER — Encounter (HOSPITAL_BASED_OUTPATIENT_CLINIC_OR_DEPARTMENT_OTHER): Payer: Self-pay

## 2024-02-16 ENCOUNTER — Ambulatory Visit (HOSPITAL_BASED_OUTPATIENT_CLINIC_OR_DEPARTMENT_OTHER)
Admission: RE | Admit: 2024-02-16 | Discharge: 2024-02-16 | Disposition: A | Discharge status: Home / Self Care | Source: Ambulatory Visit | Attending: Family Medicine | Admitting: Family Medicine

## 2024-02-16 DIAGNOSIS — Z1231 Encounter for screening mammogram for malignant neoplasm of breast: Secondary | ICD-10-CM | POA: Diagnosis not present

## 2024-03-13 ENCOUNTER — Inpatient Hospital Stay

## 2024-03-13 ENCOUNTER — Ambulatory Visit: Admitting: Hematology & Oncology

## 2024-03-13 ENCOUNTER — Ambulatory Visit

## 2024-03-14 ENCOUNTER — Other Ambulatory Visit: Payer: Self-pay | Admitting: Family Medicine

## 2024-03-14 DIAGNOSIS — F32A Depression, unspecified: Secondary | ICD-10-CM

## 2024-03-21 ENCOUNTER — Inpatient Hospital Stay

## 2024-03-21 ENCOUNTER — Inpatient Hospital Stay (HOSPITAL_BASED_OUTPATIENT_CLINIC_OR_DEPARTMENT_OTHER): Admitting: Hematology & Oncology

## 2024-03-21 ENCOUNTER — Encounter: Payer: Self-pay | Admitting: Hematology & Oncology

## 2024-03-21 ENCOUNTER — Ambulatory Visit: Payer: Self-pay | Admitting: Hematology & Oncology

## 2024-03-21 ENCOUNTER — Inpatient Hospital Stay: Attending: Family

## 2024-03-21 VITALS — BP 139/72 | HR 91 | Temp 98.1°F | Resp 20 | Ht 66.0 in | Wt 187.4 lb

## 2024-03-21 DIAGNOSIS — E611 Iron deficiency: Secondary | ICD-10-CM | POA: Diagnosis not present

## 2024-03-21 DIAGNOSIS — D696 Thrombocytopenia, unspecified: Secondary | ICD-10-CM | POA: Diagnosis not present

## 2024-03-21 DIAGNOSIS — D699 Hemorrhagic condition, unspecified: Secondary | ICD-10-CM

## 2024-03-21 DIAGNOSIS — D509 Iron deficiency anemia, unspecified: Secondary | ICD-10-CM | POA: Insufficient documentation

## 2024-03-21 DIAGNOSIS — R161 Splenomegaly, not elsewhere classified: Secondary | ICD-10-CM | POA: Diagnosis not present

## 2024-03-21 DIAGNOSIS — D500 Iron deficiency anemia secondary to blood loss (chronic): Secondary | ICD-10-CM

## 2024-03-21 DIAGNOSIS — K7581 Nonalcoholic steatohepatitis (NASH): Secondary | ICD-10-CM

## 2024-03-21 DIAGNOSIS — Z7984 Long term (current) use of oral hypoglycemic drugs: Secondary | ICD-10-CM | POA: Insufficient documentation

## 2024-03-21 LAB — FOLATE: Folate: 31.5 ng/mL (ref >5.9)

## 2024-03-21 LAB — CMP (CANCER CENTER ONLY)
ALT: 14 U/L (ref 0–44)
AST: 27 U/L (ref 15–41)
Albumin: 4.6 g/dL (ref 3.5–5.0)
Alkaline Phosphatase: 57 U/L (ref 38–126)
Anion gap: 12 (ref 5–15)
BUN: 14 mg/dL (ref 8–23)
CO2: 31 mmol/L (ref 22–32)
Calcium: 9.7 mg/dL (ref 8.9–10.3)
Chloride: 96 mmol/L — ABNORMAL LOW (ref 98–111)
Creatinine: 0.76 mg/dL (ref 0.44–1.00)
GFR, Estimated: >60 mL/min (ref >60)
Glucose, Bld: 191 mg/dL — ABNORMAL HIGH (ref 70–99)
Potassium: 3.4 mmol/L — ABNORMAL LOW (ref 3.5–5.1)
Sodium: 139 mmol/L (ref 135–145)
Total Bilirubin: 0.9 mg/dL (ref 0.0–1.2)
Total Protein: 7.7 g/dL (ref 6.5–8.1)

## 2024-03-21 LAB — RETIC PANEL
Immature Retic Fract: 9.2 % (ref 2.3–15.9)
RBC.: 4.52 MIL/uL (ref 3.87–5.11)
Retic Count, Absolute: 83.6 K/uL (ref 19.0–186.0)
Retic Ct Pct: 1.9 % (ref 0.4–3.1)
Reticulocyte Hemoglobin: 32 pg (ref >27.9)

## 2024-03-21 LAB — IRON AND IRON BINDING CAPACITY (CC-WL,HP ONLY)
Iron: 63 ug/dL (ref 28–170)
Saturation Ratios: 12 % (ref 10.4–31.8)
TIBC: 536 ug/dL — ABNORMAL HIGH (ref 250–450)
UIBC: 473 ug/dL

## 2024-03-21 LAB — CBC WITH DIFFERENTIAL (CANCER CENTER ONLY)
Abs Immature Granulocytes: 0.06 K/uL (ref 0.00–0.07)
Basophils Absolute: 0 K/uL (ref 0.0–0.1)
Basophils Relative: 1 %
Eosinophils Absolute: 0 K/uL (ref 0.0–0.5)
Eosinophils Relative: 1 %
HCT: 36.5 % (ref 36.0–46.0)
Hemoglobin: 12.4 g/dL (ref 12.0–15.0)
Immature Granulocytes: 1 %
Lymphocytes Relative: 19 %
Lymphs Abs: 1 K/uL (ref 0.7–4.0)
MCH: 27.3 pg (ref 26.0–34.0)
MCHC: 34 g/dL (ref 30.0–36.0)
MCV: 80.4 fL (ref 80.0–100.0)
Monocytes Absolute: 0.6 K/uL (ref 0.1–1.0)
Monocytes Relative: 11 %
Neutro Abs: 3.6 K/uL (ref 1.7–7.7)
Neutrophils Relative %: 67 %
Platelet Count: 66 K/uL — ABNORMAL LOW (ref 150–400)
RBC: 4.54 MIL/uL (ref 3.87–5.11)
RDW: 16.9 % — ABNORMAL HIGH (ref 11.5–15.5)
Smear Review: NORMAL
WBC Count: 5.3 K/uL (ref 4.0–10.5)
nRBC: 0 % (ref 0.0–0.2)

## 2024-03-21 LAB — LACTATE DEHYDROGENASE: LDH: 137 U/L (ref 98–192)

## 2024-03-21 LAB — FERRITIN: Ferritin: 12 ng/mL (ref 11–307)

## 2024-03-21 LAB — VITAMIN B12: Vitamin B-12: 493 pg/mL (ref 180–914)

## 2024-03-21 MED ORDER — ROMIPLOSTIM 125 MCG ~~LOC~~ SOLR
1.0000 ug/kg | SUBCUTANEOUS | Status: DC
  Administered 2024-03-21: 85 ug via SUBCUTANEOUS
  Filled 2024-03-21: qty 0.17

## 2024-03-21 NOTE — Progress Notes (Signed)
 Hematology and Oncology Follow Up Visit  Rachael Buck [id number] 1941-03-01 83 y.o. 03/21/2024   Principle Diagnosis:  Thrombocytopenia -- NASH/Splenomegaly ; Immune based thrombocytopenia Iron  deficiency   Previous Therapy:  Doptelet  40 mg po q day -- start on 06/23/2023 for dental extraction -now DC'd on 08/15/2023 Doptelet  20 mg po  q day- taken for 5 consecutive days beginning 10 to 13 days prior to scheduled procedure (May 2025)   Current Therapy:        Nplate  as indicated --give for platelet count < 75K IV iron  as indicated  -- Feraheme  given on -07/30/2022      Interim History:  Rachael Buck is here today for follow-up.  She is doing okay.  She is trying to manage her blood sugars.  Today, her blood sugar is 191.  She did eat breakfast this morning.  She has had no bleeding.  There is been no bruising.  She has had no change in bowel or bladder habits.  She has had no fever.  She there has been no leg swelling.  She has had no headache.  There is been no dysphagia or odynophagia.  Her last iron  studies that were done back in June showed a ferritin of 28 with an iron  saturation of 20%.  She did have some sebaceous cyst on her back drained.  She had no problem with this.  Overall, I would say that her performance status is probably ECOG 1.     Wt Readings from Last 3 Encounters:  03/21/24 187 lb 6.4 oz (85 kg)  02/15/24 186 lb (84.4 kg)  02/14/24 186 lb (84.4 kg)   Medications:  Allergies as of 03/21/2024       Reactions   Codeine  Nausea And Vomiting   Metformin  And Related Diarrhea   Shrimp [shellfish Allergy] Swelling        Medication List        Accurate as of March 21, 2024  9:05 AM. If you have any questions, ask your nurse or doctor.          acetaminophen  500 MG tablet Commonly known as: TYLENOL  Take 500 mg by mouth every 6 (six) hours as needed for mild pain.   albuterol  108 (90 Base) MCG/ACT inhaler Commonly known as: ProAir   HFA Inhale 2 puffs into the lungs every 4 (four) hours as needed for wheezing or shortness of breath.   ALPRAZolam  0.5 MG tablet Commonly known as: XANAX  TAKE 1 TABLET(0.5 MG) BY MOUTH THREE TIMES DAILY   avatrombopag maleate  20 MG tablet Commonly known as: DOPTELET  Take 2 tablets (40 mg total) by mouth daily for 5 consecutive days beginning 10 to 13 days prior to scheduled procedure. Take with food.   bismuth subsalicylate 262 MG/15ML suspension Commonly known as: PEPTO BISMOL Take 30 mLs by mouth every 4 (four) hours as needed.   blood glucose meter kit and supplies Kit Dispense based on patient and insurance preference. Use up to four times daily as directed.   Blood Glucose Monitoring Suppl Devi 1 each by Does not apply route in the morning, at noon, and at bedtime. May substitute to any manufacturer covered by patient's insurance.   cephALEXin  500 MG capsule Commonly known as: KEFLEX  Take 500 mg by mouth 2 (two) times daily.   colestipol  1 g tablet Commonly known as: COLESTID  1 g daily.   dapagliflozin  propanediol 10 MG Tabs tablet Commonly known as: Farxiga  Take 1 tablet (10 mg total) by mouth daily before breakfast.  hydrochlorothiazide  25 MG tablet Commonly known as: HYDRODIURIL  Take 1 tablet (25 mg total) by mouth daily.   hydrocortisone  2.5 % cream Apply 1 application  topically daily as needed (itchy dry skin).   ipratropium-albuterol  0.5-2.5 (3) MG/3ML Soln Commonly known as: DUONEB Use one 3 ml neb treatment as needed every 6-8 hours prn   loperamide  2 MG capsule Commonly known as: IMODIUM  Take 4-6 mg by mouth daily as needed (for IBS symptoms).   losartan  50 MG tablet Commonly known as: COZAAR  Take 1 tablet (50 mg total) by mouth daily.   Melatonin 10 MG Caps Take 20 mg by mouth at bedtime as needed.   OneTouch Delica Lancets Fine Misc 1 each by Other route daily.   OneTouch Ultra test strip Generic drug: glucose blood TEST BLOOD SUGAR(S)  ONCE DAILY IN THE MORNING   sertraline  100 MG tablet Commonly known as: ZOLOFT  Take 1.5 tablets (150 mg total) by mouth daily.   simvastatin  80 MG tablet Commonly known as: ZOCOR  Take 1 tablet (80 mg total) by mouth daily.   Stiolto Respimat  2.5-2.5 MCG/ACT Aers Generic drug: Tiotropium Bromide-Olodaterol INHALE 2 PUFFS INTO THE LUNGS DAILY   Stiolto Respimat  2.5-2.5 MCG/ACT Aers Generic drug: Tiotropium Bromide-Olodaterol Inhale 2 puffs into the lungs daily.   sucralfate 1 g tablet Commonly known as: CARAFATE Take 1 g by mouth 4 (four) times daily as needed.   traMADol  50 MG tablet Commonly known as: ULTRAM  TAKE 1 TABLET(50 MG) BY MOUTH EVERY 6 HOURS AS NEEDED        Allergies:  Allergies  Allergen Reactions   Codeine  Nausea And Vomiting   Metformin  And Related Diarrhea   Shrimp [Shellfish Allergy] Swelling    Past Medical History, Surgical history, Social history, and Family History were reviewed and updated.  Review of Systems: Review of Systems  Constitutional:  Negative for malaise/fatigue.  HENT: Negative.    Eyes: Negative.   Respiratory: Negative.    Cardiovascular: Negative.   Gastrointestinal:  Negative for abdominal pain.  Genitourinary: Negative.   Musculoskeletal: Negative.   Skin: Negative.   Neurological: Negative.   Endo/Heme/Allergies: Negative.   Psychiatric/Behavioral: Negative.     Physical Exam:  height is 5' 6 (1.676 m) and weight is 187 lb 6.4 oz (85 kg). Her oral temperature is 98.1 F (36.7 C). Her blood pressure is 139/72 and her pulse is 91. Her respiration is 20 and oxygen  saturation is 96%.   Wt Readings from Last 3 Encounters:  03/21/24 187 lb 6.4 oz (85 kg)  02/15/24 186 lb (84.4 kg)  02/14/24 186 lb (84.4 kg)    Physical Exam Vitals reviewed.  HENT:     Head: Normocephalic and atraumatic.  Eyes:     Pupils: Pupils are equal, round, and reactive to light.  Cardiovascular:     Rate and Rhythm: Normal rate and  regular rhythm.     Heart sounds: Normal heart sounds.  Pulmonary:     Effort: Pulmonary effort is normal.     Breath sounds: Normal breath sounds.  Abdominal:     General: Bowel sounds are normal.     Palpations: Abdomen is soft.  Musculoskeletal:        General: No tenderness or deformity. Normal range of motion.     Cervical back: Normal range of motion.  Lymphadenopathy:     Cervical: No cervical adenopathy.  Skin:    General: Skin is warm and dry.     Findings: No erythema or rash.  Neurological:     Mental Status: She is alert and oriented to person, place, and time.  Psychiatric:        Behavior: Behavior normal.        Thought Content: Thought content normal.        Judgment: Judgment normal.      Lab Results  Component Value Date   WBC 7.5 02/14/2024   HGB 12.1 02/14/2024   HCT 35.8 (L) 02/14/2024   MCV 80.8 02/14/2024   PLT 105 (L) 02/14/2024   Lab Results  Component Value Date   FERRITIN 28 02/14/2024   IRON  102 02/14/2024   TIBC 505 (H) 02/14/2024   UIBC 403 02/14/2024   IRONPCTSAT 20 02/14/2024   Lab Results  Component Value Date   RETICCTPCT 1.9 03/21/2024   RBC 4.52 03/21/2024   No results found for: KPAFRELGTCHN, LAMBDASER, KAPLAMBRATIO No results found for: IGGSERUM, IGA, IGMSERUM No results found for: STEPHANY CARLOTA BENSON MARKEL EARLA JOANNIE DOC VICK, SPEI   Chemistry      Component Value Date/Time   NA 139 03/21/2024 0830   NA 127 (L) 02/14/2017 1335   K 3.4 (L) 03/21/2024 0830   K 3.6 02/14/2017 1335   CL 96 (L) 03/21/2024 0830   CL 92 (L) 02/14/2017 1335   CO2 31 03/21/2024 0830   CO2 28 02/14/2017 1335   BUN 14 03/21/2024 0830   BUN 7 (L) 02/14/2017 1335   CREATININE 0.76 03/21/2024 0830   CREATININE 0.47 (L) 02/14/2017 1335   CREATININE 0.56 07/24/2014 1204      Component Value Date/Time   CALCIUM  9.7 03/21/2024 0830   CALCIUM  9.3 02/14/2017 1335   ALKPHOS 57 03/21/2024 0830    ALKPHOS 68 02/14/2017 1335   AST 27 03/21/2024 0830   ALT 14 03/21/2024 0830   BILITOT 0.9 03/21/2024 0830        Impression and Plan: Rachael Buck is a very pleasant 83 yo caucasian female with 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   We will go ahead and give her Nplate  today.  I am glad that she got through all of her dental work without problems with bleeding.  I will plan to see her back in another month or so.  We will get her back before the Labor Day holiday.   Maude JONELLE Crease, MD 7/23/20259:05 AM

## 2024-03-21 NOTE — Patient Instructions (Signed)
 Romiplostim Injection What is this medication? ROMIPLOSTIM (roe mi PLOE stim) treats low levels of platelets in your body caused by immune thrombocytopenia (ITP). It is prescribed when other medications have not worked or cannot be tolerated. It may also be used to help people who have been exposed to high doses of radiation. It works by increasing the amount of platelets in your blood. This lowers the risk of bleeding. This medicine may be used for other purposes; ask your health care provider or pharmacist if you have questions. COMMON BRAND NAME(S): Nplate What should I tell my care team before I take this medication? They need to know if you have any of these conditions: Blood clots Myelodysplastic syndrome An unusual or allergic reaction to romiplostim, mannitol, other medications, foods, dyes, or preservatives Pregnant or trying to get pregnant Breast-feeding How should I use this medication? This medication is injected under the skin. It is given by a care team in a hospital or clinic setting. A special MedGuide will be given to you before each treatment. Be sure to read this information carefully each time. Talk to your care team about the use of this medication in children. While it may be prescribed for children as young as newborns for selected conditions, precautions do apply. Overdosage: If you think you have taken too much of this medicine contact a poison control center or emergency room at once. NOTE: This medicine is only for you. Do not share this medicine with others. What if I miss a dose? Keep appointments for follow-up doses. It is important not to miss your dose. Call your care team if you are unable to keep an appointment. What may interact with this medication? Interactions are not expected. This list may not describe all possible interactions. Give your health care provider a list of all the medicines, herbs, non-prescription drugs, or dietary supplements you use. Also  tell them if you smoke, drink alcohol, or use illegal drugs. Some items may interact with your medicine. What should I watch for while using this medication? Visit your care team for regular checks on your progress. You may need blood work done while you are taking this medication. Your condition will be monitored carefully while you are receiving this medication. It is important not to miss any appointments. What side effects may I notice from receiving this medication? Side effects that you should report to your care team as soon as possible: Allergic reactions--skin rash, itching, hives, swelling of the face, lips, tongue, or throat Blood clot--pain, swelling, or warmth in the leg, shortness of breath, chest pain Side effects that usually do not require medical attention (report to your care team if they continue or are bothersome): Dizziness Joint pain Muscle pain Pain in the hands or feet Stomach pain Trouble sleeping This list may not describe all possible side effects. Call your doctor for medical advice about side effects. You may report side effects to FDA at 1-800-FDA-1088. Where should I keep my medication? This medication is given in a hospital or clinic. It will not be stored at home. NOTE: This sheet is a summary. It may not cover all possible information. If you have questions about this medicine, talk to your doctor, pharmacist, or health care provider.  2024 Elsevier/Gold Standard (2021-12-21 00:00:00)

## 2024-03-27 ENCOUNTER — Other Ambulatory Visit: Payer: Self-pay

## 2024-03-27 DIAGNOSIS — M545 Low back pain, unspecified: Secondary | ICD-10-CM

## 2024-03-27 DIAGNOSIS — M25559 Pain in unspecified hip: Secondary | ICD-10-CM

## 2024-03-27 MED ORDER — TRAMADOL HCL 50 MG PO TABS: ORAL_TABLET | ORAL | 0 refills | Status: DC

## 2024-03-28 ENCOUNTER — Telehealth: Payer: Self-pay | Admitting: Hematology & Oncology

## 2024-03-28 NOTE — Telephone Encounter (Signed)
 Called to schedule infusion appt per inbasket. LVM to return call for scheduling.

## 2024-04-02 ENCOUNTER — Inpatient Hospital Stay: Attending: Family

## 2024-04-02 VITALS — BP 123/58 | HR 69 | Temp 98.2°F | Resp 18

## 2024-04-02 DIAGNOSIS — D6959 Other secondary thrombocytopenia: Secondary | ICD-10-CM | POA: Diagnosis not present

## 2024-04-02 DIAGNOSIS — D500 Iron deficiency anemia secondary to blood loss (chronic): Secondary | ICD-10-CM

## 2024-04-02 DIAGNOSIS — R161 Splenomegaly, not elsewhere classified: Secondary | ICD-10-CM | POA: Diagnosis not present

## 2024-04-02 DIAGNOSIS — D696 Thrombocytopenia, unspecified: Secondary | ICD-10-CM

## 2024-04-02 DIAGNOSIS — Z79899 Other long term (current) drug therapy: Secondary | ICD-10-CM | POA: Insufficient documentation

## 2024-04-02 MED ORDER — ROMIPLOSTIM 125 MCG ~~LOC~~ SOLR: 1.0000 ug/kg | SUBCUTANEOUS | Status: DC

## 2024-04-02 MED ORDER — SODIUM CHLORIDE 0.9 % IV SOLN: INTRAVENOUS | Status: DC

## 2024-04-02 MED ORDER — IRON SUCROSE 20 MG/ML IV SOLN
200.0000 mg | Freq: Once | INTRAVENOUS | Status: AC
  Administered 2024-04-02: 200 mg via INTRAVENOUS
  Filled 2024-04-02: qty 10

## 2024-04-18 ENCOUNTER — Other Ambulatory Visit: Payer: Self-pay | Admitting: Family Medicine

## 2024-04-18 DIAGNOSIS — I10 Essential (primary) hypertension: Secondary | ICD-10-CM

## 2024-04-18 DIAGNOSIS — F419 Anxiety disorder, unspecified: Secondary | ICD-10-CM

## 2024-04-26 ENCOUNTER — Encounter: Payer: Self-pay | Admitting: Medical Oncology

## 2024-04-26 ENCOUNTER — Inpatient Hospital Stay: Admitting: Medical Oncology

## 2024-04-26 ENCOUNTER — Inpatient Hospital Stay (HOSPITAL_BASED_OUTPATIENT_CLINIC_OR_DEPARTMENT_OTHER)

## 2024-04-26 ENCOUNTER — Inpatient Hospital Stay

## 2024-04-26 VITALS — BP 117/66 | HR 82 | Temp 98.7°F | Resp 19 | Ht 66.0 in | Wt 187.0 lb

## 2024-04-26 DIAGNOSIS — D6959 Other secondary thrombocytopenia: Secondary | ICD-10-CM | POA: Diagnosis not present

## 2024-04-26 DIAGNOSIS — K7581 Nonalcoholic steatohepatitis (NASH): Secondary | ICD-10-CM | POA: Diagnosis not present

## 2024-04-26 DIAGNOSIS — D696 Thrombocytopenia, unspecified: Secondary | ICD-10-CM

## 2024-04-26 DIAGNOSIS — D500 Iron deficiency anemia secondary to blood loss (chronic): Secondary | ICD-10-CM

## 2024-04-26 DIAGNOSIS — D699 Hemorrhagic condition, unspecified: Secondary | ICD-10-CM

## 2024-04-26 LAB — IRON AND IRON BINDING CAPACITY (CC-WL,HP ONLY)
Iron: 94 ug/dL (ref 28–170)
Saturation Ratios: 20 % (ref 10.4–31.8)
TIBC: 473 ug/dL — ABNORMAL HIGH (ref 250–450)
UIBC: 379 ug/dL

## 2024-04-26 LAB — CBC WITH DIFFERENTIAL (CANCER CENTER ONLY)
Abs Granulocyte: 4 K/uL (ref 1.5–6.5)
Abs Immature Granulocytes: 0.05 K/uL (ref 0.00–0.07)
Basophils Absolute: 0.1 K/uL (ref 0.0–0.1)
Basophils Relative: 1 %
Eosinophils Absolute: 0 K/uL (ref 0.0–0.5)
Eosinophils Relative: 1 %
HCT: 37.6 % (ref 36.0–46.0)
Hemoglobin: 13.1 g/dL (ref 12.0–15.0)
Immature Granulocytes: 1 %
Lymphocytes Relative: 22 %
Lymphs Abs: 1.3 K/uL (ref 0.7–4.0)
MCH: 28.4 pg (ref 26.0–34.0)
MCHC: 34.8 g/dL (ref 30.0–36.0)
MCV: 81.6 fL (ref 80.0–100.0)
Monocytes Absolute: 0.7 K/uL (ref 0.1–1.0)
Monocytes Relative: 11 %
Neutro Abs: 4 K/uL (ref 1.7–7.7)
Neutrophils Relative %: 64 %
Platelet Count: 59 K/uL — ABNORMAL LOW (ref 150–400)
RBC: 4.61 MIL/uL (ref 3.87–5.11)
RDW: 16.9 % — ABNORMAL HIGH (ref 11.5–15.5)
WBC Count: 6.1 K/uL (ref 4.0–10.5)
nRBC: 0 % (ref 0.0–0.2)

## 2024-04-26 LAB — CMP (CANCER CENTER ONLY)
ALT: 13 U/L (ref 0–44)
AST: 25 U/L (ref 15–41)
Albumin: 4.6 g/dL (ref 3.5–5.0)
Alkaline Phosphatase: 54 U/L (ref 38–126)
Anion gap: 13 (ref 5–15)
BUN: 17 mg/dL (ref 8–23)
CO2: 31 mmol/L (ref 22–32)
Calcium: 9.9 mg/dL (ref 8.9–10.3)
Chloride: 95 mmol/L — ABNORMAL LOW (ref 98–111)
Creatinine: 0.81 mg/dL (ref 0.44–1.00)
GFR, Estimated: >60 mL/min (ref >60)
Glucose, Bld: 174 mg/dL — ABNORMAL HIGH (ref 70–99)
Potassium: 3.4 mmol/L — ABNORMAL LOW (ref 3.5–5.1)
Sodium: 138 mmol/L (ref 135–145)
Total Bilirubin: 1.1 mg/dL (ref 0.0–1.2)
Total Protein: 7.8 g/dL (ref 6.5–8.1)

## 2024-04-26 LAB — RETICULOCYTES
Immature Retic Fract: 9.6 % (ref 2.3–15.9)
RBC.: 4.55 MIL/uL (ref 3.87–5.11)
Retic Count, Absolute: 95.6 K/uL (ref 19.0–186.0)
Retic Ct Pct: 2.1 % (ref 0.4–3.1)

## 2024-04-26 LAB — FERRITIN: Ferritin: 62 ng/mL (ref 11–307)

## 2024-04-26 MED ORDER — ROMIPLOSTIM 125 MCG ~~LOC~~ SOLR
1.0000 ug/kg | SUBCUTANEOUS | Status: DC
  Administered 2024-04-26: 85 ug via SUBCUTANEOUS
  Filled 2024-04-26: qty 0.17

## 2024-04-26 NOTE — Progress Notes (Signed)
 Hematology and Oncology Follow Up Visit  YARI SZELIGA [id number] 10-May-1941 83 y.o. 04/26/2024   Principle Diagnosis:  Thrombocytopenia -- NASH/Splenomegaly ; Immune based thrombocytopenia Iron  deficiency   Previous Therapy:  Doptelet  40 mg po q day -- start on 06/23/2023 for dental extraction -now DC'd on 08/15/2023 Doptelet  20 mg po  q day- taken for 5 consecutive days beginning 10 to 13 days prior to scheduled procedure (May 2025)   Current Therapy:        Nplate  as indicated --give for platelet count < 75K IV iron  as indicated  -- Feraheme  given on -07/30/2022      Interim History:  Ms. Rogue is here today for follow-up.   Today she states that she has been doing well. She has no concerns today.   She has had no bleeding.  There is been no bruising.  She has had no change in bowel or bladder habits.  She has had no fever.  She there has been no leg swelling.  She has had no headache.  There is been no dysphagia or odynophagia.  Overall, I would say that her performance status is probably ECOG 1.     Wt Readings from Last 3 Encounters:  04/26/24 187 lb (84.8 kg)  03/21/24 187 lb 6.4 oz (85 kg)  02/15/24 186 lb (84.4 kg)   Medications:  Allergies as of 04/26/2024       Reactions   Codeine  Nausea And Vomiting   Metformin  And Related Diarrhea   Shrimp [shellfish Allergy] Swelling        Medication List        Accurate as of April 26, 2024 10:56 AM. If you have any questions, ask your nurse or doctor.          acetaminophen  500 MG tablet Commonly known as: TYLENOL  Take 500 mg by mouth every 6 (six) hours as needed for mild pain.   albuterol  108 (90 Base) MCG/ACT inhaler Commonly known as: ProAir  HFA Inhale 2 puffs into the lungs every 4 (four) hours as needed for wheezing or shortness of breath.   ALPRAZolam  0.5 MG tablet Commonly known as: XANAX  TAKE 1 TABLET(0.5 MG) BY MOUTH THREE TIMES DAILY   avatrombopag maleate  20 MG tablet Commonly  known as: DOPTELET  Take 2 tablets (40 mg total) by mouth daily for 5 consecutive days beginning 10 to 13 days prior to scheduled procedure. Take with food.   bismuth subsalicylate 262 MG/15ML suspension Commonly known as: PEPTO BISMOL Take 30 mLs by mouth every 4 (four) hours as needed.   blood glucose meter kit and supplies Kit Dispense based on patient and insurance preference. Use up to four times daily as directed.   Blood Glucose Monitoring Suppl Devi 1 each by Does not apply route in the morning, at noon, and at bedtime. May substitute to any manufacturer covered by patient's insurance.   cephALEXin  500 MG capsule Commonly known as: KEFLEX  Take 500 mg by mouth 2 (two) times daily.   colestipol  1 g tablet Commonly known as: COLESTID  1 g daily.   dapagliflozin  propanediol 10 MG Tabs tablet Commonly known as: Farxiga  Take 1 tablet (10 mg total) by mouth daily before breakfast.   hydrochlorothiazide  25 MG tablet Commonly known as: HYDRODIURIL  TAKE 1 TABLET(25 MG) BY MOUTH DAILY   hydrocortisone  2.5 % cream Apply 1 application  topically daily as needed (itchy dry skin).   ipratropium-albuterol  0.5-2.5 (3) MG/3ML Soln Commonly known as: DUONEB Use one 3 ml neb treatment as  needed every 6-8 hours prn   loperamide  2 MG capsule Commonly known as: IMODIUM  Take 4-6 mg by mouth daily as needed (for IBS symptoms).   losartan  50 MG tablet Commonly known as: COZAAR  Take 1 tablet (50 mg total) by mouth daily.   Melatonin 10 MG Caps Take 20 mg by mouth at bedtime as needed.   OneTouch Delica Lancets Fine Misc 1 each by Other route daily.   OneTouch Ultra test strip Generic drug: glucose blood TEST BLOOD SUGAR(S) ONCE DAILY IN THE MORNING   sertraline  100 MG tablet Commonly known as: ZOLOFT  Take 1.5 tablets (150 mg total) by mouth daily.   simvastatin  80 MG tablet Commonly known as: ZOCOR  Take 1 tablet (80 mg total) by mouth daily.   Stiolto Respimat  2.5-2.5 MCG/ACT  Aers Generic drug: Tiotropium Bromide-Olodaterol INHALE 2 PUFFS INTO THE LUNGS DAILY   Stiolto Respimat  2.5-2.5 MCG/ACT Aers Generic drug: Tiotropium Bromide-Olodaterol Inhale 2 puffs into the lungs daily.   sucralfate 1 g tablet Commonly known as: CARAFATE Take 1 g by mouth 4 (four) times daily as needed.   traMADol  50 MG tablet Commonly known as: ULTRAM  TAKE 1 TABLET(50 MG) BY MOUTH EVERY 6 HOURS AS NEEDED        Allergies:  Allergies  Allergen Reactions   Codeine  Nausea And Vomiting   Metformin  And Related Diarrhea   Shrimp [Shellfish Allergy] Swelling    Past Medical History, Surgical history, Social history, and Family History were reviewed and updated.  Review of Systems: Review of Systems  Constitutional:  Negative for malaise/fatigue.  HENT: Negative.    Eyes: Negative.   Respiratory: Negative.    Cardiovascular: Negative.   Gastrointestinal:  Negative for abdominal pain.  Genitourinary: Negative.   Musculoskeletal: Negative.   Skin: Negative.   Neurological: Negative.   Endo/Heme/Allergies: Negative.   Psychiatric/Behavioral: Negative.     Physical Exam:  height is 5' 6 (1.676 m) and weight is 187 lb (84.8 kg). Her oral temperature is 98.7 F (37.1 C). Her blood pressure is 117/66 and her pulse is 82. Her respiration is 19 and oxygen  saturation is 100%.   Wt Readings from Last 3 Encounters:  04/26/24 187 lb (84.8 kg)  03/21/24 187 lb 6.4 oz (85 kg)  02/15/24 186 lb (84.4 kg)    Physical Exam Vitals reviewed.  HENT:     Head: Normocephalic and atraumatic.  Eyes:     Pupils: Pupils are equal, round, and reactive to light.  Cardiovascular:     Rate and Rhythm: Normal rate and regular rhythm.     Heart sounds: Normal heart sounds.  Pulmonary:     Effort: Pulmonary effort is normal.     Breath sounds: Normal breath sounds.  Abdominal:     General: Bowel sounds are normal.     Palpations: Abdomen is soft.  Musculoskeletal:        General: No  tenderness or deformity. Normal range of motion.     Cervical back: Normal range of motion.  Lymphadenopathy:     Cervical: No cervical adenopathy.  Skin:    General: Skin is warm and dry.     Findings: No erythema or rash.  Neurological:     Mental Status: She is alert and oriented to person, place, and time.  Psychiatric:        Behavior: Behavior normal.        Thought Content: Thought content normal.        Judgment: Judgment normal.  Lab Results  Component Value Date   WBC 6.1 04/26/2024   HGB 13.1 04/26/2024   HCT 37.6 04/26/2024   MCV 81.6 04/26/2024   PLT 59 (L) 04/26/2024   Lab Results  Component Value Date   FERRITIN 12 03/21/2024   IRON  63 03/21/2024   TIBC 536 (H) 03/21/2024   UIBC 473 03/21/2024   IRONPCTSAT 12 03/21/2024   Lab Results  Component Value Date   RETICCTPCT 2.1 04/26/2024   RBC 4.61 04/26/2024   RBC 4.55 04/26/2024   No results found for: KPAFRELGTCHN, LAMBDASER, KAPLAMBRATIO No results found for: IGGSERUM, IGA, IGMSERUM No results found for: STEPHANY CARLOTA BENSON MARKEL EARLA JOANNIE DOC VICK, SPEI   Chemistry      Component Value Date/Time   NA 139 03/21/2024 0830   NA 127 (L) 02/14/2017 1335   K 3.4 (L) 03/21/2024 0830   K 3.6 02/14/2017 1335   CL 96 (L) 03/21/2024 0830   CL 92 (L) 02/14/2017 1335   CO2 31 03/21/2024 0830   CO2 28 02/14/2017 1335   BUN 14 03/21/2024 0830   BUN 7 (L) 02/14/2017 1335   CREATININE 0.76 03/21/2024 0830   CREATININE 0.47 (L) 02/14/2017 1335   CREATININE 0.56 07/24/2014 1204      Component Value Date/Time   CALCIUM  9.7 03/21/2024 0830   CALCIUM  9.3 02/14/2017 1335   ALKPHOS 57 03/21/2024 0830   ALKPHOS 68 02/14/2017 1335   AST 27 03/21/2024 0830   ALT 14 03/21/2024 0830   BILITOT 0.9 03/21/2024 0830      No diagnosis found.   Impression and Plan: Ms. Zimny is a very pleasant 83 yo caucasian female with thrombocytopenia secondary to  splenomegaly with NASH.   Nplate  today for platelets of 59. Hgb 13.1 CMP pending Iron  pending  Nplate  today  RTC 1 month MD, labs (CBC w/, CMP, retic, iron , ferritin), Injection    Lauraine HERO [place], PA-C 8/28/202510:56 AM

## 2024-04-26 NOTE — Patient Instructions (Signed)
 Romiplostim Injection What is this medication? ROMIPLOSTIM (roe mi PLOE stim) treats low levels of platelets in your body caused by immune thrombocytopenia (ITP). It is prescribed when other medications have not worked or cannot be tolerated. It may also be used to help people who have been exposed to high doses of radiation. It works by increasing the amount of platelets in your blood. This lowers the risk of bleeding. This medicine may be used for other purposes; ask your health care provider or pharmacist if you have questions. COMMON BRAND NAME(S): Nplate What should I tell my care team before I take this medication? They need to know if you have any of these conditions: Blood clots Myelodysplastic syndrome An unusual or allergic reaction to romiplostim, mannitol, other medications, foods, dyes, or preservatives Pregnant or trying to get pregnant Breast-feeding How should I use this medication? This medication is injected under the skin. It is given by a care team in a hospital or clinic setting. A special MedGuide will be given to you before each treatment. Be sure to read this information carefully each time. Talk to your care team about the use of this medication in children. While it may be prescribed for children as young as newborns for selected conditions, precautions do apply. Overdosage: If you think you have taken too much of this medicine contact a poison control center or emergency room at once. NOTE: This medicine is only for you. Do not share this medicine with others. What if I miss a dose? Keep appointments for follow-up doses. It is important not to miss your dose. Call your care team if you are unable to keep an appointment. What may interact with this medication? Interactions are not expected. This list may not describe all possible interactions. Give your health care provider a list of all the medicines, herbs, non-prescription drugs, or dietary supplements you use. Also  tell them if you smoke, drink alcohol, or use illegal drugs. Some items may interact with your medicine. What should I watch for while using this medication? Visit your care team for regular checks on your progress. You may need blood work done while you are taking this medication. Your condition will be monitored carefully while you are receiving this medication. It is important not to miss any appointments. What side effects may I notice from receiving this medication? Side effects that you should report to your care team as soon as possible: Allergic reactions--skin rash, itching, hives, swelling of the face, lips, tongue, or throat Blood clot--pain, swelling, or warmth in the leg, shortness of breath, chest pain Side effects that usually do not require medical attention (report to your care team if they continue or are bothersome): Dizziness Joint pain Muscle pain Pain in the hands or feet Stomach pain Trouble sleeping This list may not describe all possible side effects. Call your doctor for medical advice about side effects. You may report side effects to FDA at 1-800-FDA-1088. Where should I keep my medication? This medication is given in a hospital or clinic. It will not be stored at home. NOTE: This sheet is a summary. It may not cover all possible information. If you have questions about this medicine, talk to your doctor, pharmacist, or health care provider.  2024 Elsevier/Gold Standard (2021-12-21 00:00:00)

## 2024-05-03 ENCOUNTER — Other Ambulatory Visit: Payer: Self-pay | Admitting: Hematology & Oncology

## 2024-05-03 DIAGNOSIS — M545 Low back pain, unspecified: Secondary | ICD-10-CM

## 2024-05-03 DIAGNOSIS — M25559 Pain in unspecified hip: Secondary | ICD-10-CM

## 2024-05-28 ENCOUNTER — Encounter: Payer: Self-pay | Admitting: Hematology & Oncology

## 2024-05-28 ENCOUNTER — Inpatient Hospital Stay: Admitting: Hematology & Oncology

## 2024-05-28 ENCOUNTER — Inpatient Hospital Stay

## 2024-05-28 ENCOUNTER — Inpatient Hospital Stay: Attending: Family

## 2024-05-28 ENCOUNTER — Other Ambulatory Visit: Payer: Self-pay

## 2024-05-28 VITALS — BP 110/58 | HR 86 | Temp 98.7°F | Resp 18 | Ht 66.0 in | Wt 189.0 lb

## 2024-05-28 DIAGNOSIS — K7581 Nonalcoholic steatohepatitis (NASH): Secondary | ICD-10-CM

## 2024-05-28 DIAGNOSIS — E611 Iron deficiency: Secondary | ICD-10-CM | POA: Diagnosis not present

## 2024-05-28 DIAGNOSIS — D500 Iron deficiency anemia secondary to blood loss (chronic): Secondary | ICD-10-CM

## 2024-05-28 DIAGNOSIS — D696 Thrombocytopenia, unspecified: Secondary | ICD-10-CM

## 2024-05-28 DIAGNOSIS — D6959 Other secondary thrombocytopenia: Secondary | ICD-10-CM | POA: Diagnosis not present

## 2024-05-28 DIAGNOSIS — R161 Splenomegaly, not elsewhere classified: Secondary | ICD-10-CM | POA: Diagnosis not present

## 2024-05-28 LAB — CBC WITH DIFFERENTIAL (CANCER CENTER ONLY)
Abs Immature Granulocytes: 0.05 K/uL (ref 0.00–0.07)
Basophils Absolute: 0.1 K/uL (ref 0.0–0.1)
Basophils Relative: 1 %
Eosinophils Absolute: 0 K/uL (ref 0.0–0.5)
Eosinophils Relative: 0 %
HCT: 37.6 % (ref 36.0–46.0)
Hemoglobin: 13 g/dL (ref 12.0–15.0)
Immature Granulocytes: 1 %
Lymphocytes Relative: 19 %
Lymphs Abs: 1.5 K/uL (ref 0.7–4.0)
MCH: 28.4 pg (ref 26.0–34.0)
MCHC: 34.6 g/dL (ref 30.0–36.0)
MCV: 82.3 fL (ref 80.0–100.0)
Monocytes Absolute: 0.9 K/uL (ref 0.1–1.0)
Monocytes Relative: 11 %
Neutro Abs: 5.3 K/uL (ref 1.7–7.7)
Neutrophils Relative %: 68 %
Platelet Count: 65 K/uL — ABNORMAL LOW (ref 150–400)
RBC: 4.57 MIL/uL (ref 3.87–5.11)
RDW: 16.7 % — ABNORMAL HIGH (ref 11.5–15.5)
WBC Count: 7.9 K/uL (ref 4.0–10.5)
nRBC: 0 % (ref 0.0–0.2)

## 2024-05-28 LAB — CMP (CANCER CENTER ONLY)
ALT: 15 U/L (ref 0–44)
AST: 29 U/L (ref 15–41)
Albumin: 4.6 g/dL (ref 3.5–5.0)
Alkaline Phosphatase: 57 U/L (ref 38–126)
Anion gap: 12 (ref 5–15)
BUN: 17 mg/dL (ref 8–23)
CO2: 30 mmol/L (ref 22–32)
Calcium: 9.5 mg/dL (ref 8.9–10.3)
Chloride: 96 mmol/L — ABNORMAL LOW (ref 98–111)
Creatinine: 0.87 mg/dL (ref 0.44–1.00)
GFR, Estimated: >60 mL/min (ref >60)
Glucose, Bld: 152 mg/dL — ABNORMAL HIGH (ref 70–99)
Potassium: 3.4 mmol/L — ABNORMAL LOW (ref 3.5–5.1)
Sodium: 139 mmol/L (ref 135–145)
Total Bilirubin: 1.2 mg/dL (ref 0.0–1.2)
Total Protein: 7.8 g/dL (ref 6.5–8.1)

## 2024-05-28 LAB — IRON AND IRON BINDING CAPACITY (CC-WL,HP ONLY)
Iron: 94 ug/dL (ref 28–170)
Saturation Ratios: 20 % (ref 10.4–31.8)
TIBC: 480 ug/dL — ABNORMAL HIGH (ref 250–450)
UIBC: 386 ug/dL

## 2024-05-28 LAB — RETIC PANEL
Immature Retic Fract: 11.2 % (ref 2.3–15.9)
RBC.: 4.57 MIL/uL (ref 3.87–5.11)
Retic Count, Absolute: 100.5 K/uL (ref 19.0–186.0)
Retic Ct Pct: 2.2 % (ref 0.4–3.1)
Reticulocyte Hemoglobin: 32.5 pg (ref >27.9)

## 2024-05-28 LAB — FERRITIN: Ferritin: 28 ng/mL (ref 11–307)

## 2024-05-28 MED ORDER — ROMIPLOSTIM 125 MCG ~~LOC~~ SOLR
1.0000 ug/kg | SUBCUTANEOUS | Status: DC
  Administered 2024-05-28: 85 ug via SUBCUTANEOUS
  Filled 2024-05-28: qty 0.17

## 2024-05-28 NOTE — Progress Notes (Signed)
 Hematology and Oncology Follow Up Visit  Rachael Buck [id number] 1940-09-18 83 y.o. 05/28/2024   Principle Diagnosis:  Thrombocytopenia -- NASH/Splenomegaly ; Immune based thrombocytopenia Iron  deficiency   Previous Therapy:  Doptelet  40 mg po q day -- start on 06/23/2023 for dental extraction -now DC'd on 08/15/2023 Doptelet  20 mg po  q day- taken for 5 consecutive days beginning 10 to 13 days prior to scheduled procedure (May 2025)   Current Therapy:        Nplate  as indicated --give for platelet count < 75K IV iron  as indicated  --Venofer  given on 04/02/2024       Interim History:  [name] is here today for follow-up.  Unfortunately, she fell I think yesterday.  Thankfully, she did not break anything.  She does have little bit of a bruise in the right temple area.  Otherwise, she has been holding her own.  Her blood sugars might be getting a little bit better.  She is having a little bit discomfort over on the left side.  We will see about doing an ultrasound of her spleen.  It has been quite a while since she had a prior ultrasound.  She has had no issues with nausea or vomiting.  She has had no problems with actual bleeding.  She has had no change in bowel or bladder habits.  She has lost a little bit of weight.  She  has had no problems with fever.  She has had no issues with COVID.    Her last iron  studies that were done back in August showed a ferritin of 62 with an iron  saturation of 20%.  We did going give her a dose of IV iron .  Overall, I would have to see that her performance status is probably ECOG 1.      Wt Readings from Last 3 Encounters:  05/28/24 189 lb (85.7 kg)  04/26/24 187 lb (84.8 kg)  03/21/24 187 lb 6.4 oz (85 kg)   Medications:  Allergies as of 05/28/2024       Reactions   Codeine  Nausea And Vomiting   Metformin  And Related Diarrhea   Shrimp [shellfish Allergy] Swelling        Medication List        Accurate as of May 28, 2024 11:35 AM. If you have any questions, ask your nurse or doctor.          acetaminophen  500 MG tablet Commonly known as: TYLENOL  Take 500 mg by mouth every 6 (six) hours as needed for mild pain.   albuterol  108 (90 Base) MCG/ACT inhaler Commonly known as: ProAir  HFA Inhale 2 puffs into the lungs every 4 (four) hours as needed for wheezing or shortness of breath.   ALPRAZolam  0.5 MG tablet Commonly known as: XANAX  TAKE 1 TABLET(0.5 MG) BY MOUTH THREE TIMES DAILY   avatrombopag maleate  20 MG tablet Commonly known as: DOPTELET  Take 2 tablets (40 mg total) by mouth daily for 5 consecutive days beginning 10 to 13 days prior to scheduled procedure. Take with food.   bismuth subsalicylate 262 MG/15ML suspension Commonly known as: PEPTO BISMOL Take 30 mLs by mouth every 4 (four) hours as needed.   blood glucose meter kit and supplies Kit Dispense based on patient and insurance preference. Use up to four times daily as directed.   Blood Glucose Monitoring Suppl Devi 1 each by Does not apply route in the morning, at noon, and at bedtime. May substitute to any manufacturer covered  by patient's insurance.   cephALEXin  500 MG capsule Commonly known as: KEFLEX  Take 500 mg by mouth 2 (two) times daily.   colestipol  1 g tablet Commonly known as: COLESTID  1 g daily.   dapagliflozin  propanediol 10 MG Tabs tablet Commonly known as: Farxiga  Take 1 tablet (10 mg total) by mouth daily before breakfast.   hydrochlorothiazide  25 MG tablet Commonly known as: HYDRODIURIL  TAKE 1 TABLET(25 MG) BY MOUTH DAILY   hydrocortisone  2.5 % cream Apply 1 application  topically daily as needed (itchy dry skin).   ipratropium-albuterol  0.5-2.5 (3) MG/3ML Soln Commonly known as: DUONEB Use one 3 ml neb treatment as needed every 6-8 hours prn   loperamide  2 MG capsule Commonly known as: IMODIUM  Take 4-6 mg by mouth daily as needed (for IBS symptoms).   losartan  50 MG tablet Commonly known as:  COZAAR  Take 1 tablet (50 mg total) by mouth daily.   Melatonin 10 MG Caps Take 20 mg by mouth at bedtime as needed.   OneTouch Delica Lancets Fine Misc 1 each by Other route daily.   OneTouch Ultra test strip Generic drug: glucose blood TEST BLOOD SUGAR(S) ONCE DAILY IN THE MORNING   sertraline  100 MG tablet Commonly known as: ZOLOFT  Take 1.5 tablets (150 mg total) by mouth daily.   simvastatin  80 MG tablet Commonly known as: ZOCOR  Take 1 tablet (80 mg total) by mouth daily.   Stiolto Respimat  2.5-2.5 MCG/ACT Aers Generic drug: Tiotropium Bromide-Olodaterol INHALE 2 PUFFS INTO THE LUNGS DAILY   Stiolto Respimat  2.5-2.5 MCG/ACT Aers Generic drug: Tiotropium Bromide-Olodaterol Inhale 2 puffs into the lungs daily.   sucralfate 1 g tablet Commonly known as: CARAFATE Take 1 g by mouth 4 (four) times daily as needed.   traMADol  50 MG tablet Commonly known as: ULTRAM  TAKE 1 TABLET(50 MG) BY MOUTH EVERY 6 HOURS AS NEEDED        Allergies:  Allergies  Allergen Reactions   Codeine  Nausea And Vomiting   Metformin  And Related Diarrhea   Shrimp [Shellfish Allergy] Swelling    Past Medical History, Surgical history, Social history, and Family History were reviewed and updated.  Review of Systems: Review of Systems  Constitutional:  Negative for malaise/fatigue.  HENT: Negative.    Eyes: Negative.   Respiratory: Negative.    Cardiovascular: Negative.   Gastrointestinal:  Negative for abdominal pain.  Genitourinary: Negative.   Musculoskeletal: Negative.   Skin: Negative.   Neurological: Negative.   Endo/Heme/Allergies: Negative.   Psychiatric/Behavioral: Negative.     Physical Exam:  height is 5' 6 (1.676 m) and weight is 189 lb (85.7 kg). Her oral temperature is 98.7 F (37.1 C). Her blood pressure is 110/58 (abnormal) and her pulse is 86. Her respiration is 18 and oxygen  saturation is 95%.   Wt Readings from Last 3 Encounters:  05/28/24 189 lb (85.7 kg)   04/26/24 187 lb (84.8 kg)  03/21/24 187 lb 6.4 oz (85 kg)    Physical Exam Vitals reviewed.  HENT:     Head: Normocephalic and atraumatic.  Eyes:     Pupils: Pupils are equal, round, and reactive to light.  Cardiovascular:     Rate and Rhythm: Normal rate and regular rhythm.     Heart sounds: Normal heart sounds.  Pulmonary:     Effort: Pulmonary effort is normal.     Breath sounds: Normal breath sounds.  Abdominal:     General: Bowel sounds are normal.     Palpations: Abdomen is soft.  Musculoskeletal:  General: No tenderness or deformity. Normal range of motion.     Cervical back: Normal range of motion.  Lymphadenopathy:     Cervical: No cervical adenopathy.  Skin:    General: Skin is warm and dry.     Findings: No erythema or rash.  Neurological:     Mental Status: She is alert and oriented to person, place, and time.  Psychiatric:        Behavior: Behavior normal.        Thought Content: Thought content normal.        Judgment: Judgment normal.      Lab Results  Component Value Date   WBC 7.9 05/28/2024   HGB 13.0 05/28/2024   HCT 37.6 05/28/2024   MCV 82.3 05/28/2024   PLT 65 (L) 05/28/2024   Lab Results  Component Value Date   FERRITIN 62 04/26/2024   IRON  94 04/26/2024   TIBC 473 (H) 04/26/2024   UIBC 379 04/26/2024   IRONPCTSAT 20 04/26/2024   Lab Results  Component Value Date   RETICCTPCT 2.2 05/28/2024   RBC 4.57 05/28/2024   No results found for: KPAFRELGTCHN, LAMBDASER, KAPLAMBRATIO No results found for: IGGSERUM, IGA, IGMSERUM No results found for: STEPHANY CARLOTA BENSON MARKEL EARLA JOANNIE DOC VICK, SPEI   Chemistry      Component Value Date/Time   NA 139 05/28/2024 1019   NA 127 (L) 02/14/2017 1335   K 3.4 (L) 05/28/2024 1019   K 3.6 02/14/2017 1335   CL 96 (L) 05/28/2024 1019   CL 92 (L) 02/14/2017 1335   CO2 30 05/28/2024 1019   CO2 28 02/14/2017 1335   BUN 17 05/28/2024 1019    BUN 7 (L) 02/14/2017 1335   CREATININE 0.87 05/28/2024 1019   CREATININE 0.47 (L) 02/14/2017 1335   CREATININE 0.56 07/24/2014 1204      Component Value Date/Time   CALCIUM  9.5 05/28/2024 1019   CALCIUM  9.3 02/14/2017 1335   ALKPHOS 57 05/28/2024 1019   ALKPHOS 68 02/14/2017 1335   AST 29 05/28/2024 1019   ALT 15 05/28/2024 1019   BILITOT 1.2 05/28/2024 1019        Impression and Plan: Ms. 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   We will go ahead and give her Nplate  today.  From my point of view, everything is doing pretty well.  I know that she has a NASH.  I am not sure if or when she would ever be put on medication for this.  I would think that Gastroenterology would be the ones to manage that if it happens.  We will set up the ultrasound of her spleen for about 2 weeks.  I will plan to get her back in about 6 weeks.  We will try to arrange her schedule so that we can get her through the Holiday season.   Maude JONELLE Crease, MD 9/29/202511:35 AM

## 2024-05-28 NOTE — Patient Instructions (Signed)
 Romiplostim Injection What is this medication? ROMIPLOSTIM (roe mi PLOE stim) treats low levels of platelets in your body caused by immune thrombocytopenia (ITP). It is prescribed when other medications have not worked or cannot be tolerated. It may also be used to help people who have been exposed to high doses of radiation. It works by increasing the amount of platelets in your blood. This lowers the risk of bleeding. This medicine may be used for other purposes; ask your health care provider or pharmacist if you have questions. COMMON BRAND NAME(S): Nplate What should I tell my care team before I take this medication? They need to know if you have any of these conditions: Blood clots Myelodysplastic syndrome An unusual or allergic reaction to romiplostim, mannitol, other medications, foods, dyes, or preservatives Pregnant or trying to get pregnant Breast-feeding How should I use this medication? This medication is injected under the skin. It is given by a care team in a hospital or clinic setting. A special MedGuide will be given to you before each treatment. Be sure to read this information carefully each time. Talk to your care team about the use of this medication in children. While it may be prescribed for children as young as newborns for selected conditions, precautions do apply. Overdosage: If you think you have taken too much of this medicine contact a poison control center or emergency room at once. NOTE: This medicine is only for you. Do not share this medicine with others. What if I miss a dose? Keep appointments for follow-up doses. It is important not to miss your dose. Call your care team if you are unable to keep an appointment. What may interact with this medication? Interactions are not expected. This list may not describe all possible interactions. Give your health care provider a list of all the medicines, herbs, non-prescription drugs, or dietary supplements you use. Also  tell them if you smoke, drink alcohol, or use illegal drugs. Some items may interact with your medicine. What should I watch for while using this medication? Visit your care team for regular checks on your progress. You may need blood work done while you are taking this medication. Your condition will be monitored carefully while you are receiving this medication. It is important not to miss any appointments. What side effects may I notice from receiving this medication? Side effects that you should report to your care team as soon as possible: Allergic reactions--skin rash, itching, hives, swelling of the face, lips, tongue, or throat Blood clot--pain, swelling, or warmth in the leg, shortness of breath, chest pain Side effects that usually do not require medical attention (report to your care team if they continue or are bothersome): Dizziness Joint pain Muscle pain Pain in the hands or feet Stomach pain Trouble sleeping This list may not describe all possible side effects. Call your doctor for medical advice about side effects. You may report side effects to FDA at 1-800-FDA-1088. Where should I keep my medication? This medication is given in a hospital or clinic. It will not be stored at home. NOTE: This sheet is a summary. It may not cover all possible information. If you have questions about this medicine, talk to your doctor, pharmacist, or health care provider.  2024 Elsevier/Gold Standard (2021-12-21 00:00:00)

## 2024-05-31 ENCOUNTER — Ambulatory Visit (HOSPITAL_BASED_OUTPATIENT_CLINIC_OR_DEPARTMENT_OTHER)
Admission: RE | Admit: 2024-05-31 | Discharge: 2024-05-31 | Disposition: A | Discharge status: Home / Self Care | Source: Ambulatory Visit | Attending: Hematology & Oncology | Admitting: Hematology & Oncology

## 2024-05-31 ENCOUNTER — Ambulatory Visit: Payer: Self-pay | Admitting: Hematology & Oncology

## 2024-05-31 DIAGNOSIS — K7581 Nonalcoholic steatohepatitis (NASH): Secondary | ICD-10-CM | POA: Diagnosis not present

## 2024-05-31 DIAGNOSIS — R161 Splenomegaly, not elsewhere classified: Secondary | ICD-10-CM | POA: Diagnosis not present

## 2024-06-01 ENCOUNTER — Encounter: Payer: Self-pay | Admitting: Hematology & Oncology

## 2024-06-01 NOTE — Telephone Encounter (Signed)
-----   Message from Maude JONELLE Crease sent at 05/31/2024  4:45 PM EDT ----- Please call and let her know that the spleen really has not increased in size.  It has now been  2-1/2 years since the last ultrasound so this is encouraging.  Thanks.  Jeralyn ----- Message ----- From: Rebecka, Rad Results In Sent: 05/31/2024   3:03 PM EDT To: Maude JONELLE Crease, MD

## 2024-06-01 NOTE — Telephone Encounter (Signed)
 Advised via MyChart.

## 2024-06-07 DIAGNOSIS — E119 Type 2 diabetes mellitus without complications: Secondary | ICD-10-CM | POA: Insufficient documentation

## 2024-06-07 NOTE — Patient Instructions (Addendum)
 It was great to see you again today, I will be in touch with your lab work If not done already I recommend 1 dose of RSV vaccine, a COVID booster this fall and the Shingrix vaccine series-all these can be done through your pharmacy  You may qualify for a medication called Rezdiffra if you have liver fibrosis/ stiffness.  We will set up an ultrasound to look at this for you   If all is well let's plan to visit in about 6 months

## 2024-06-07 NOTE — Progress Notes (Signed)
 [facility] [address], [address] [place], [place] [address] [id number] [phone]  Date:  06/11/2024   Name:  Rachael Buck   DOB:  23-Oct-1940   MRN:  [id number]  PCP:  Watt Harlene BROCKS, MD    Chief Complaint: Follow-up (Arthritis in both hands, I can't hold on to stuff )   History of Present Illness:  Rachael Buck is a 83 y.o. very pleasant female patient who presents with the following:  Patient seen today for periodic follow-up, foot exam.  I saw her most recently in 2024-03-14 Her husband passed away in February 15, 2024History of well-controlled diabetes, hypertension, IBS, Nash, cirrhosis, hyperlipidemia, thrombocytopenia, asthma with COPD Dr Timmy is following her immune-based thrombocytopenia and iron  deficiency  Most recent visit with hematology September 9 She is treated with Doptelet  as needed prior to any minor surgical procedure She was given Nplate  at her visit last month and they plan to get an ultrasound of her spleen  She also is seen by pulmonology, Dr. Shelah.  Most recent visit in March of this year  Farxiga , HCTZ, losartan , sertraline , simvastatin , Stiolto Respimat , tramadol  as needed Can update A1c  Flu shot- give today  She declines a bone density Mammo is UTD  Recommend COVID booster, RSV, Shingrix Needs a foot exam Labs on chart from September: CMP, iron  studies, CBC  Lab Results  Component Value Date   HGBA1C 7.7 (H) 06/11/2024     Discussed the use of AI scribe software for clinical note transcription with the patient, who gave verbal consent to proceed.  History of Present Illness Rachael Buck is an 83 year old female who presents with sinus congestion and dry mouth.  She has been experiencing sinus congestion and dry mouth for the past three weeks. The symptoms began in her head and moved downwards, accompanied by a sensation of dryness. Her mouth feels 'stuck together' upon waking,  which she attributes to her false teeth. The symptoms have slightly improved over time and she is finally starting to feel better  Two weeks ago, she experienced a fall while attempting to cut a toenail. She was balancing on a swivel chair, lost her balance, and the chair fell on her. She has bruising, particularly on her buttocks.  She feels fine from this fall, does not think she was seriously injured  She has a history of diabetes and is currently managing her condition with blood glucose monitoring. She is seeking a new glucometer, specifically a One Touch Ultra, and requires test strips for her glucometer. She mentions having a recent blood workup from a doctor in [place], which included platelet counts.  She experiences arthritis in her hands, which affects her ability to turn pages and hold objects. She experiences swelling and has been using Aleve once a day for relief, as she could not find Tylenol  PM. She has also started using a topical diclofenac gel.  She mentions wearing compression socks occasionally, as advised by her daughter, but notes that she does not wear them regularly since she is not working anymore.    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   Thrombocytopenia 02/15/2017   Varicose veins of left lower extremity with complications 11/21/2015   Age-related cognitive decline 06/02/2015   Fatigue 07/19/2014   Orthostatic hypotension 05/14/2013   Nail abnormality 09/04/2012   Bleeding disorder 05/08/2012   Varices,  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   NASH (nonalcoholic steatohepatitis) 03/21/2012   HTN (hypertension) 12/21/2011   Depression 03/28/2011   IBS (irritable bowel syndrome) 03/23/2011   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/27/2010   DYSPNEA ON EXERTION 07/27/2010   Uncontrolled diabetes mellitus 07/24/2010   Mixed hyperlipidemia 07/24/2010   Obesity, unspecified 07/24/2010   ALLERGIC RHINITIS CAUSE UNSPECIFIED 07/24/2010   REFLUX ESOPHAGITIS 07/24/2010   PAIN IN JOINT PELVIC REGION AND THIGH 07/24/2010    Past Medical History:  Diagnosis Date   Anxiety    Arthritis    Asthma    Cirrhosis, nonalcoholic (HCC)    Colon polyps    Diabetes mellitus    Diverticulitis    GERD (gastroesophageal reflux disease)    Hyperlipidemia    Hypertension    NASH (nonalcoholic steatohepatitis)    Obesity    Pneumonia     Past Surgical History:  Procedure Laterality Date   CHOLECYSTECTOMY  1981   ENDOVENOUS ABLATION SAPHENOUS VEIN W/ LASER Left 02/21/2018   endovenous laser ablation left greater saphenous vein by Lynwood Collum MD    OOPHORECTOMY  1973   PARTIAL HYSTERECTOMY  1972   ROTATOR CUFF REPAIR     bilateral. 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   Procedure: LEFT TOTAL KNEE ARTHROPLASTY;  Surgeon: Kay Kemps, MD;  Location: [facility];  Service: Orthopedics;  Laterality: Left;   VEIN SURGERY      Social History   Tobacco Use   Smoking status: Former    Average packs/day: 1.5 packs/day for 20.0 years (30.0 ttl pk-yrs)    Types: Cigarettes    Start date: 1973   Smokeless tobacco: Never   Tobacco comments:    Quit 2002.  Vaping Use   Vaping status: Never Used  Substance Use Topics   Alcohol use: No   Drug use: No    Family History  Problem Relation Age of Onset   Stomach cancer Mother    Diabetes Sister    Breast cancer Daughter 28   Breast cancer Maternal Aunt    Diabetes Brother     Cancer Other    GER disease Other    Obesity Other     Allergies  Allergen Reactions   Codeine  Nausea And Vomiting   Metformin  And Related Diarrhea   Shrimp [Shellfish Allergy] Swelling    Medication list has been reviewed and updated.  Current Outpatient Medications on File Prior to Visit  Medication Sig Dispense Refill   acetaminophen  (TYLENOL ) 500 MG tablet Take 500 mg by mouth every 6 (six) hours as needed for mild pain.     albuterol  (PROAIR  HFA) 108 (90 Base) MCG/ACT inhaler Inhale 2 puffs into the lungs every 4 (four) hours as needed for wheezing or shortness of breath. 1 Inhaler 6   ALPRAZolam  (XANAX ) 0.5 MG tablet TAKE 1 TABLET(0.5 MG) BY MOUTH THREE TIMES DAILY 90 tablet 2   avatrombopag maleate  (DOPTELET ) 20 MG tablet Take 2 tablets (40 mg total) by mouth daily for 5 consecutive days beginning 10 to 13 days prior  to scheduled procedure. Take with food. 10 tablet 0   bismuth subsalicylate (PEPTO BISMOL) 262 MG/15ML suspension Take 30 mLs by mouth every 4 (four) hours as needed.     blood glucose meter kit and supplies KIT Dispense based on patient and insurance preference. Use up to four times daily as directed. 1 each 0   Blood Glucose Monitoring Suppl DEVI 1 each by Does not apply route in the morning, at noon, and at bedtime. May substitute to any manufacturer covered by patient's insurance. 1 each 0   cephALEXin  (KEFLEX ) 500 MG capsule Take 500 mg by mouth 2 (two) times daily.     colestipol  (COLESTID ) 1 g tablet 1 g daily.     dapagliflozin  propanediol (FARXIGA ) 10 MG TABS tablet Take 1 tablet (10 mg total) by mouth daily before breakfast. 90 tablet 3   glucose blood (ONETOUCH ULTRA) test strip TEST BLOOD SUGAR(S) ONCE DAILY IN THE MORNING 100 strip 12   hydrochlorothiazide  (HYDRODIURIL ) 25 MG tablet TAKE 1 TABLET(25 MG) BY MOUTH DAILY 90 tablet 3   hydrocortisone  2.5 % cream Apply 1 application  topically daily as needed (itchy dry skin).     ipratropium-albuterol   (DUONEB) 0.5-2.5 (3) MG/3ML SOLN Use one 3 ml neb treatment as needed every 6-8 hours prn 360 mL 1   loperamide  (IMODIUM ) 2 MG capsule Take 4-6 mg by mouth daily as needed (for IBS symptoms).     losartan  (COZAAR ) 50 MG tablet Take 1 tablet (50 mg total) by mouth daily. 90 tablet 0   Melatonin 10 MG CAPS Take 20 mg by mouth at bedtime as needed.     ONETOUCH DELICA LANCETS FINE MISC 1 each by Other route daily. 100 each 12   simvastatin  (ZOCOR ) 80 MG tablet Take 1 tablet (80 mg total) by mouth daily. 90 tablet 1   STIOLTO RESPIMAT  2.5-2.5 MCG/ACT AERS INHALE 2 PUFFS INTO THE LUNGS DAILY 4 g 5   sucralfate (CARAFATE) 1 g tablet Take 1 g by mouth 4 (four) times daily as needed.     Tiotropium Bromide-Olodaterol (STIOLTO RESPIMAT ) 2.5-2.5 MCG/ACT AERS Inhale 2 puffs into the lungs daily. 4 g 1   traMADol  (ULTRAM ) 50 MG tablet TAKE 1 TABLET(50 MG) BY MOUTH EVERY 6 HOURS AS NEEDED 60 tablet 0   No current facility-administered medications on file prior to visit.    Review of Systems:  As per HPI- otherwise negative.   Physical Examination: Vitals:   06/11/24 1052  BP: 134/68  Pulse: 95  SpO2: 97%   Vitals:   06/11/24 1052  Weight: 189 lb 6.4 oz (85.9 kg)  Height: 5' 6 (1.676 m)   Body mass index is 30.57 kg/m. Ideal Body Weight: Weight in (lb) to have BMI = 25: 154.6  GEN: no acute distress.  Mildly obese, looks well HEENT: Atraumatic, Normocephalic.  Bilateral TM wnl, oropharynx normal.  PEERL,EOMI. hard of hearing Ears and Nose: No external deformity. CV: RRR, No M/G/R. No JVD. No thrill. No extra heart sounds. PULM: CTA B, no wheezes, crackles, rhonchi. No retractions. No resp. distress. No accessory muscle use. ABD: S, NT, ND. No rebound. No HSM. EXTR: No c/c/e PSYCH: Normally interactive. Conversant.  Foot exam done today -benign Generalized osteoarthritis changes are present in the interphalangeal and MCP joints of both hands Assessment and Plan: Asthma with COPD  (chronic obstructive pulmonary disease) (HCC)  Primary hypertension  NASH (nonalcoholic steatohepatitis) - Plan: US  ELASTOGRAPHY LIVER  Controlled type 2 diabetes mellitus without complication, without  long-term current use of insulin  (HCC) - Plan: Hemoglobin A1c, Lipid panel, Blood Glucose Monitoring Suppl (ONE TOUCH ULTRA 2) w/Device KIT  Thyroid  disorder screening - Plan: TSH  Depression, unspecified depression type - Plan: sertraline  (ZOLOFT ) 100 MG tablet  Need for influenza vaccination - Plan: Flu vaccine HIGH DOSE PF(Fluzone Trivalent)  Assessment & Plan Primary osteoarthritis of both hands Moderate to severe osteoarthritic changes, more pronounced in the right hand. Current management with diclofenac gel is safe given her liver condition and thrombocytopenia. - Continue diclofenac (Voltaren) gel for pain management. - Avoid NSAIDs like Aleve due to  bleeding risks.  Limit Tylenol  due to history of liver disease  Type 2 diabetes mellitus without complications Diabetes management requires monitoring. She needs a new glucometer and test strips. - Order A1c test to assess diabetes control. - Provide a new One Touch Ultra glucometer and test strips. - Send prescription for glucometer and supplies to [facility].  Nonalcoholic steatohepatitis (NASH) She inquired about a new medication for fatty liver disease. - Research new medication for NASH as mentioned by her.  We will also obtain an elastography to see if she has any fibrosis  General Health Maintenance Flu vaccination is due. - Administer flu shot during the visit.  Signed Harlene Schroeder, MD  Received labs as below, message to patient  Results for orders placed or performed in visit on 06/11/24  Hemoglobin A1c   Collection Time: 06/11/24 11:35 AM  Result Value Ref Range   Hgb A1c MFr Bld 7.7 (H) 4.6 - 6.5 %  Lipid panel   Collection Time: 06/11/24 11:35 AM  Result Value Ref Range   Cholesterol 146 0 - 200 mg/dL    Triglycerides 794.9 (H) 0.0 - 149.0 mg/dL   HDL 44.49 >60.99 mg/dL   VLDL 58.9 (H) 0.0 - 59.9 mg/dL   LDL Cholesterol 50 0 - 99 mg/dL   Total CHOL/HDL Ratio 3    NonHDL 90.66   TSH   Collection Time: 06/11/24 11:35 AM  Result Value Ref Range   TSH 2.47 0.35 - 5.50 uIU/mL

## 2024-06-11 ENCOUNTER — Encounter: Payer: Self-pay | Admitting: Family Medicine

## 2024-06-11 ENCOUNTER — Ambulatory Visit: Admitting: Family Medicine

## 2024-06-11 VITALS — BP 134/68 | HR 95 | Ht 66.0 in | Wt 189.4 lb

## 2024-06-11 DIAGNOSIS — F32A Depression, unspecified: Secondary | ICD-10-CM

## 2024-06-11 DIAGNOSIS — Z1329 Encounter for screening for other suspected endocrine disorder: Secondary | ICD-10-CM

## 2024-06-11 DIAGNOSIS — J4489 Other specified chronic obstructive pulmonary disease: Secondary | ICD-10-CM | POA: Diagnosis not present

## 2024-06-11 DIAGNOSIS — I10 Essential (primary) hypertension: Secondary | ICD-10-CM

## 2024-06-11 DIAGNOSIS — Z23 Encounter for immunization: Secondary | ICD-10-CM

## 2024-06-11 DIAGNOSIS — K7581 Nonalcoholic steatohepatitis (NASH): Secondary | ICD-10-CM

## 2024-06-11 DIAGNOSIS — E119 Type 2 diabetes mellitus without complications: Secondary | ICD-10-CM

## 2024-06-11 LAB — LIPID PANEL
Cholesterol: 146 mg/dL (ref 0–200)
HDL: 55.5 mg/dL (ref >39.00)
LDL Cholesterol: 50 mg/dL (ref 0–99)
NonHDL: 90.66
Total CHOL/HDL Ratio: 3
Triglycerides: 205 mg/dL — ABNORMAL HIGH (ref 0.0–149.0)
VLDL: 41 mg/dL — ABNORMAL HIGH (ref 0.0–40.0)

## 2024-06-11 LAB — HEMOGLOBIN A1C: Hgb A1c MFr Bld: 7.7 % — ABNORMAL HIGH (ref 4.6–6.5)

## 2024-06-11 LAB — TSH: TSH: 2.47 u[IU]/mL (ref 0.35–5.50)

## 2024-06-11 MED ORDER — SERTRALINE HCL 100 MG PO TABS: 150.0000 mg | ORAL_TABLET | Freq: Every day | ORAL | 3 refills | Status: AC

## 2024-06-11 MED ORDER — ONETOUCH ULTRA 2 W/DEVICE KIT
PACK | 0 refills | Status: AC
Start: 2024-06-11 — End: ?

## 2024-06-20 ENCOUNTER — Other Ambulatory Visit: Payer: Self-pay | Admitting: Hematology & Oncology

## 2024-06-20 DIAGNOSIS — M545 Low back pain, unspecified: Secondary | ICD-10-CM

## 2024-06-20 DIAGNOSIS — M25559 Pain in unspecified hip: Secondary | ICD-10-CM

## 2024-06-21 ENCOUNTER — Ambulatory Visit (HOSPITAL_BASED_OUTPATIENT_CLINIC_OR_DEPARTMENT_OTHER)
Admission: RE | Admit: 2024-06-21 | Discharge: 2024-06-21 | Disposition: A | Discharge status: Home / Self Care | Source: Ambulatory Visit | Attending: Family Medicine | Admitting: Family Medicine

## 2024-06-21 DIAGNOSIS — K7581 Nonalcoholic steatohepatitis (NASH): Secondary | ICD-10-CM | POA: Diagnosis not present

## 2024-06-26 ENCOUNTER — Encounter: Payer: Self-pay | Admitting: Family Medicine

## 2024-07-11 ENCOUNTER — Other Ambulatory Visit: Payer: Self-pay | Admitting: Family Medicine

## 2024-07-11 DIAGNOSIS — I10 Essential (primary) hypertension: Secondary | ICD-10-CM

## 2024-07-12 ENCOUNTER — Telehealth: Payer: Self-pay | Admitting: Family Medicine

## 2024-07-12 NOTE — Telephone Encounter (Signed)
 Copied from CRM #[id number]. Topic: General - Other >> Jul 12, 2024 11:29 AM Alfonso HERO wrote: Reason for CRM: patient calling to request image results to be sent to another Dr. I informed her that she will need to sign a medical release form for that and that if they are a part of the [facility] systems they should already have access to it. She also asked for the nurse to call her back and didn't give any reason for the call back.

## 2024-07-13 DIAGNOSIS — K766 Portal hypertension: Secondary | ICD-10-CM | POA: Diagnosis not present

## 2024-07-13 DIAGNOSIS — K7469 Other cirrhosis of liver: Secondary | ICD-10-CM | POA: Diagnosis not present

## 2024-07-16 ENCOUNTER — Inpatient Hospital Stay (HOSPITAL_BASED_OUTPATIENT_CLINIC_OR_DEPARTMENT_OTHER): Admitting: Medical Oncology

## 2024-07-16 ENCOUNTER — Encounter: Payer: Self-pay | Admitting: Medical Oncology

## 2024-07-16 ENCOUNTER — Inpatient Hospital Stay

## 2024-07-16 ENCOUNTER — Inpatient Hospital Stay: Attending: Family

## 2024-07-16 VITALS — BP 128/90 | HR 78 | Temp 98.4°F | Resp 18 | Wt 189.8 lb

## 2024-07-16 DIAGNOSIS — G8929 Other chronic pain: Secondary | ICD-10-CM | POA: Diagnosis not present

## 2024-07-16 DIAGNOSIS — Z7984 Long term (current) use of oral hypoglycemic drugs: Secondary | ICD-10-CM | POA: Diagnosis not present

## 2024-07-16 DIAGNOSIS — D696 Thrombocytopenia, unspecified: Secondary | ICD-10-CM | POA: Diagnosis not present

## 2024-07-16 DIAGNOSIS — K7581 Nonalcoholic steatohepatitis (NASH): Secondary | ICD-10-CM | POA: Diagnosis not present

## 2024-07-16 DIAGNOSIS — K7460 Unspecified cirrhosis of liver: Secondary | ICD-10-CM | POA: Diagnosis not present

## 2024-07-16 DIAGNOSIS — D500 Iron deficiency anemia secondary to blood loss (chronic): Secondary | ICD-10-CM | POA: Diagnosis not present

## 2024-07-16 DIAGNOSIS — R161 Splenomegaly, not elsewhere classified: Secondary | ICD-10-CM | POA: Diagnosis not present

## 2024-07-16 DIAGNOSIS — D6959 Other secondary thrombocytopenia: Secondary | ICD-10-CM | POA: Diagnosis not present

## 2024-07-16 LAB — CBC WITH DIFFERENTIAL (CANCER CENTER ONLY)
Abs Immature Granulocytes: 0.07 K/uL (ref 0.00–0.07)
Basophils Absolute: 0 K/uL (ref 0.0–0.1)
Basophils Relative: 1 %
Eosinophils Absolute: 0 K/uL (ref 0.0–0.5)
Eosinophils Relative: 1 %
HCT: 34.5 % — ABNORMAL LOW (ref 36.0–46.0)
Hemoglobin: 11.8 g/dL — ABNORMAL LOW (ref 12.0–15.0)
Immature Granulocytes: 1 %
Lymphocytes Relative: 20 %
Lymphs Abs: 1.3 K/uL (ref 0.7–4.0)
MCH: 28.9 pg (ref 26.0–34.0)
MCHC: 34.2 g/dL (ref 30.0–36.0)
MCV: 84.6 fL (ref 80.0–100.0)
Monocytes Absolute: 0.7 K/uL (ref 0.1–1.0)
Monocytes Relative: 11 %
Neutro Abs: 4.5 K/uL (ref 1.7–7.7)
Neutrophils Relative %: 66 %
Platelet Count: 63 K/uL — ABNORMAL LOW (ref 150–400)
RBC: 4.08 MIL/uL (ref 3.87–5.11)
RDW: 16.6 % — ABNORMAL HIGH (ref 11.5–15.5)
WBC Count: 6.7 K/uL (ref 4.0–10.5)
nRBC: 0 % (ref 0.0–0.2)

## 2024-07-16 LAB — CMP (CANCER CENTER ONLY)
ALT: 13 U/L (ref 0–44)
AST: 27 U/L (ref 15–41)
Albumin: 4.5 g/dL (ref 3.5–5.0)
Alkaline Phosphatase: 53 U/L (ref 38–126)
Anion gap: 12 (ref 5–15)
BUN: 15 mg/dL (ref 8–23)
CO2: 30 mmol/L (ref 22–32)
Calcium: 9.4 mg/dL (ref 8.9–10.3)
Chloride: 97 mmol/L — ABNORMAL LOW (ref 98–111)
Creatinine: 0.74 mg/dL (ref 0.44–1.00)
GFR, Estimated: >60 mL/min (ref >60)
Glucose, Bld: 158 mg/dL — ABNORMAL HIGH (ref 70–99)
Potassium: 3.7 mmol/L (ref 3.5–5.1)
Sodium: 140 mmol/L (ref 135–145)
Total Bilirubin: 1.3 mg/dL — ABNORMAL HIGH (ref 0.0–1.2)
Total Protein: 7.6 g/dL (ref 6.5–8.1)

## 2024-07-16 LAB — IRON AND IRON BINDING CAPACITY (CC-WL,HP ONLY)
Iron: 102 ug/dL (ref 28–170)
Saturation Ratios: 21 % (ref 10.4–31.8)
TIBC: 494 ug/dL — ABNORMAL HIGH (ref 250–450)
UIBC: 392 ug/dL

## 2024-07-16 LAB — FERRITIN: Ferritin: 30 ng/mL (ref 11–307)

## 2024-07-16 LAB — RETICULOCYTES
Immature Retic Fract: 18.9 % — ABNORMAL HIGH (ref 2.3–15.9)
RBC.: 4.09 MIL/uL (ref 3.87–5.11)
Retic Count, Absolute: 117.4 K/uL (ref 19.0–186.0)
Retic Ct Pct: 2.9 % (ref 0.4–3.1)

## 2024-07-16 LAB — SAVE SMEAR(SSMR), FOR PROVIDER SLIDE REVIEW

## 2024-07-16 MED ORDER — ROMIPLOSTIM 125 MCG ~~LOC~~ SOLR
1.0000 ug/kg | SUBCUTANEOUS | Status: DC
  Administered 2024-07-16: 85 ug via SUBCUTANEOUS
  Filled 2024-07-16: qty 0.17

## 2024-07-16 MED ORDER — SODIUM CHLORIDE 0.9 % IV SOLN: INTRAVENOUS | Status: DC

## 2024-07-16 MED ORDER — IRON SUCROSE 20 MG/ML IV SOLN
200.0000 mg | Freq: Once | INTRAVENOUS | Status: AC
  Administered 2024-07-16: 200 mg via INTRAVENOUS
  Filled 2024-07-16: qty 10

## 2024-07-16 NOTE — Progress Notes (Signed)
 Hematology and Oncology Follow Up Visit  BRYAH OCHELTREE [id number] October 09, 1940 83 y.o. 07/16/2024   Principle Diagnosis:  Thrombocytopenia -- NASH/Splenomegaly ; Immune based thrombocytopenia Iron  deficiency   Previous Therapy:  Doptelet  40 mg po q day -- start on 06/23/2023 for dental extraction -now DC'd on 08/15/2023 Doptelet  20 mg po  q day- taken for 5 consecutive days beginning 10 to 13 days prior to scheduled procedure (May 2025)   Current Therapy:        Nplate  as indicated --give for platelet count < 75K IV iron  as indicated  --Venofer  given on 04/02/2024       Interim History:  Ms. Ikner is here today for follow-up.    Today she states that she has been ok in general but was diagnosed with cirrhosis of the liver. At her last visit she was having some left sided discomfort so a US  was discussed. This showed stable splenomegaly. She also had a US  Elastography of the liver which showed cirrhosis and median hepatic elasticity. This order was planned by her PCP Dr. Watt. She has since seen her liver specialist. No change in plan anticipated at this time.   She does have chronic left hip pain for which she takes Tramadol . She is going to see orthopedics after the 1st of the years.   She has had no issues with nausea or vomiting.  She has had no problems with actual bleeding.  She has had no change in bowel or bladder habits.  She has lost a little bit of weight.  She  has had no problems with fever.  She has had no issues with COVID.    Her last iron  studies that were done back in Sept showed a ferritin of 28 with an iron  saturation of 20%.Her last dose of IV iron  was on 04/02/2024  Overall, I would have to see that her performance status is probably ECOG 1.    Wt Readings from Last 3 Encounters:  07/16/24 189 lb 12.8 oz (86.1 kg)  06/11/24 189 lb 6.4 oz (85.9 kg)  05/28/24 189 lb (85.7 kg)   Medications:  Allergies as of 07/16/2024       Reactions   Codeine   Nausea And Vomiting   Metformin  And Related Diarrhea   Shrimp [shellfish Allergy] Swelling        Medication List        Accurate as of July 16, 2024 11:29 AM. If you have any questions, ask your nurse or doctor.          acetaminophen  500 MG tablet Commonly known as: TYLENOL  Take 500 mg by mouth every 6 (six) hours as needed for mild pain.   albuterol  108 (90 Base) MCG/ACT inhaler Commonly known as: ProAir  HFA Inhale 2 puffs into the lungs every 4 (four) hours as needed for wheezing or shortness of breath.   ALPRAZolam  0.5 MG tablet Commonly known as: XANAX  TAKE 1 TABLET(0.5 MG) BY MOUTH THREE TIMES DAILY   avatrombopag maleate  20 MG tablet Commonly known as: DOPTELET  Take 2 tablets (40 mg total) by mouth daily for 5 consecutive days beginning 10 to 13 days prior to scheduled procedure. Take with food.   bismuth subsalicylate 262 MG/15ML suspension Commonly known as: PEPTO BISMOL Take 30 mLs by mouth every 4 (four) hours as needed.   blood glucose meter kit and supplies Kit Dispense based on patient and insurance preference. Use up to four times daily as directed.   Blood Glucose Monitoring Suppl Lester  1 each by Does not apply route in the morning, at noon, and at bedtime. May substitute to any manufacturer covered by patient's insurance.   ONE TOUCH ULTRA 2 w/Device Kit Use to check blood glucose 1-2x daily   cephALEXin  500 MG capsule Commonly known as: KEFLEX  Take 500 mg by mouth 2 (two) times daily.   colestipol  1 g tablet Commonly known as: COLESTID  1 g daily.   dapagliflozin  propanediol 10 MG Tabs tablet Commonly known as: Farxiga  Take 1 tablet (10 mg total) by mouth daily before breakfast.   hydrochlorothiazide  25 MG tablet Commonly known as: HYDRODIURIL  TAKE 1 TABLET(25 MG) BY MOUTH DAILY   hydrocortisone  2.5 % cream Apply 1 application  topically daily as needed (itchy dry skin).   ipratropium-albuterol  0.5-2.5 (3) MG/3ML Soln Commonly  known as: DUONEB Use one 3 ml neb treatment as needed every 6-8 hours prn   loperamide  2 MG capsule Commonly known as: IMODIUM  Take 4-6 mg by mouth daily as needed (for IBS symptoms).   losartan  50 MG tablet Commonly known as: COZAAR  TAKE 1 TABLET(50 MG) BY MOUTH DAILY   Melatonin 10 MG Caps Take 20 mg by mouth at bedtime as needed.   OneTouch Delica Lancets Fine Misc 1 each by Other route daily.   OneTouch Ultra test strip Generic drug: glucose blood TEST BLOOD SUGAR(S) ONCE DAILY IN THE MORNING   sertraline  100 MG tablet Commonly known as: ZOLOFT  Take 1.5 tablets (150 mg total) by mouth daily.   simvastatin  80 MG tablet Commonly known as: ZOCOR  Take 1 tablet (80 mg total) by mouth daily.   Stiolto Respimat  2.5-2.5 MCG/ACT Aers Generic drug: Tiotropium Bromide-Olodaterol INHALE 2 PUFFS INTO THE LUNGS DAILY   Stiolto Respimat  2.5-2.5 MCG/ACT Aers Generic drug: Tiotropium Bromide-Olodaterol Inhale 2 puffs into the lungs daily.   sucralfate 1 g tablet Commonly known as: CARAFATE Take 1 g by mouth 4 (four) times daily as needed.   traMADol  50 MG tablet Commonly known as: ULTRAM  TAKE 1 TABLET(50 MG) BY MOUTH EVERY 6 HOURS AS NEEDED        Allergies:  Allergies  Allergen Reactions   Codeine  Nausea And Vomiting   Metformin  And Related Diarrhea   Shrimp [Shellfish Allergy] Swelling    Past Medical History, Surgical history, Social history, and Family History were reviewed and updated.  Review of Systems: Review of Systems  Constitutional:  Negative for malaise/fatigue.  HENT: Negative.    Eyes: Negative.   Respiratory: Negative.    Cardiovascular: Negative.   Gastrointestinal:  Negative for abdominal pain.  Genitourinary: Negative.   Musculoskeletal: Negative.   Skin: Negative.   Neurological: Negative.   Endo/Heme/Allergies: Negative.   Psychiatric/Behavioral: Negative.     Physical Exam:  weight is 189 lb 12.8 oz (86.1 kg). Her oral temperature is  98.4 F (36.9 C). Her blood pressure is 128/90 (abnormal) and her pulse is 78. Her respiration is 18 and oxygen  saturation is 98%.   Wt Readings from Last 3 Encounters:  07/16/24 189 lb 12.8 oz (86.1 kg)  06/11/24 189 lb 6.4 oz (85.9 kg)  05/28/24 189 lb (85.7 kg)    Physical Exam Vitals reviewed.  HENT:     Head: Normocephalic and atraumatic.  Eyes:     Pupils: Pupils are equal, round, and reactive to light.  Cardiovascular:     Rate and Rhythm: Normal rate and regular rhythm.     Heart sounds: Normal heart sounds.  Pulmonary:     Effort: Pulmonary effort is  normal.     Breath sounds: Normal breath sounds.  Abdominal:     General: Bowel sounds are normal.     Palpations: Abdomen is soft.  Musculoskeletal:        General: No tenderness or deformity. Normal range of motion.     Cervical back: Normal range of motion.  Lymphadenopathy:     Cervical: No cervical adenopathy.  Skin:    General: Skin is warm and dry.     Findings: No erythema or rash.  Neurological:     Mental Status: She is alert and oriented to person, place, and time.  Psychiatric:        Behavior: Behavior normal.        Thought Content: Thought content normal.        Judgment: Judgment normal.      Lab Results  Component Value Date   WBC 6.7 07/16/2024   HGB 11.8 (L) 07/16/2024   HCT 34.5 (L) 07/16/2024   MCV 84.6 07/16/2024   PLT 63 (L) 07/16/2024   Lab Results  Component Value Date   FERRITIN 28 05/28/2024   IRON  94 05/28/2024   TIBC 480 (H) 05/28/2024   UIBC 386 05/28/2024   IRONPCTSAT 20 05/28/2024   Lab Results  Component Value Date   RETICCTPCT 2.9 07/16/2024   RBC 4.08 07/16/2024   RBC 4.09 07/16/2024   No results found for: KPAFRELGTCHN, LAMBDASER, KAPLAMBRATIO No results found for: IGGSERUM, IGA, IGMSERUM No results found for: STEPHANY CARLOTA BENSON MARKEL EARLA JOANNIE DOC VICK, SPEI   Chemistry      Component Value Date/Time    NA 140 07/16/2024 1017   NA 127 (L) 02/14/2017 1335   K 3.7 07/16/2024 1017   K 3.6 02/14/2017 1335   CL 97 (L) 07/16/2024 1017   CL 92 (L) 02/14/2017 1335   CO2 30 07/16/2024 1017   CO2 28 02/14/2017 1335   BUN 15 07/16/2024 1017   BUN 7 (L) 02/14/2017 1335   CREATININE 0.74 07/16/2024 1017   CREATININE 0.47 (L) 02/14/2017 1335   CREATININE 0.56 07/24/2014 1204      Component Value Date/Time   CALCIUM  9.4 07/16/2024 1017   CALCIUM  9.3 02/14/2017 1335   ALKPHOS 53 07/16/2024 1017   ALKPHOS 68 02/14/2017 1335   AST 27 07/16/2024 1017   ALT 13 07/16/2024 1017   BILITOT 1.3 (H) 07/16/2024 1017      Encounter Diagnoses  Name Primary?   Thrombocytopenia Yes   NASH (nonalcoholic steatohepatitis)    Iron  deficiency anemia due to chronic blood loss    Impression and Plan: Ms. 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 She also gets IV iron  PRN.   We will go ahead and give her Nplate  today for platelets of 63 Given last iron  values and Hgb today we will give her 1 round of IV iron  today RTC weekly x 4 for IV iron   RTC 6 weeks MD, labs, Injection (Nplate )   Lauraine CHRISTELLA Dais, PA-C 11/17/202511:29 AM

## 2024-07-16 NOTE — Patient Instructions (Signed)
 Romiplostim Injection What is this medication? ROMIPLOSTIM (roe mi PLOE stim) treats low levels of platelets in your body caused by immune thrombocytopenia (ITP). It is prescribed when other medications have not worked or cannot be tolerated. It may also be used to help people who have been exposed to high doses of radiation. It works by increasing the amount of platelets in your blood. This lowers the risk of bleeding. This medicine may be used for other purposes; ask your health care provider or pharmacist if you have questions. COMMON BRAND NAME(S): Nplate What should I tell my care team before I take this medication? They need to know if you have any of these conditions: Blood clots Myelodysplastic syndrome An unusual or allergic reaction to romiplostim, mannitol, other medications, foods, dyes, or preservatives Pregnant or trying to get pregnant Breast-feeding How should I use this medication? This medication is injected under the skin. It is given by a care team in a hospital or clinic setting. A special MedGuide will be given to you before each treatment. Be sure to read this information carefully each time. Talk to your care team about the use of this medication in children. While it may be prescribed for children as young as newborns for selected conditions, precautions do apply. Overdosage: If you think you have taken too much of this medicine contact a poison control center or emergency room at once. NOTE: This medicine is only for you. Do not share this medicine with others. What if I miss a dose? Keep appointments for follow-up doses. It is important not to miss your dose. Call your care team if you are unable to keep an appointment. What may interact with this medication? Interactions are not expected. This list may not describe all possible interactions. Give your health care provider a list of all the medicines, herbs, non-prescription drugs, or dietary supplements you use. Also  tell them if you smoke, drink alcohol, or use illegal drugs. Some items may interact with your medicine. What should I watch for while using this medication? Visit your care team for regular checks on your progress. You may need blood work done while you are taking this medication. Your condition will be monitored carefully while you are receiving this medication. It is important not to miss any appointments. What side effects may I notice from receiving this medication? Side effects that you should report to your care team as soon as possible: Allergic reactions--skin rash, itching, hives, swelling of the face, lips, tongue, or throat Blood clot--pain, swelling, or warmth in the leg, shortness of breath, chest pain Side effects that usually do not require medical attention (report to your care team if they continue or are bothersome): Dizziness Joint pain Muscle pain Pain in the hands or feet Stomach pain Trouble sleeping This list may not describe all possible side effects. Call your doctor for medical advice about side effects. You may report side effects to FDA at 1-800-FDA-1088. Where should I keep my medication? This medication is given in a hospital or clinic. It will not be stored at home. NOTE: This sheet is a summary. It may not cover all possible information. If you have questions about this medicine, talk to your doctor, pharmacist, or health care provider.  2024 Elsevier/Gold Standard (2021-12-21 00:00:00)

## 2024-07-19 NOTE — Telephone Encounter (Signed)
 Spoke with pt, pt is requesting to go over liver imaging.

## 2024-07-19 NOTE — Telephone Encounter (Signed)
 Called patient back.  I sent her a message about her liver elastography but it sounds like her daughter may have actually viewed this on MyChart as opposed to the patient.  I went over the results with her, I would like her to discuss with her gastroenterologist who may end up this having her see hepatology.  She has plans to see Dr. Saintclair with [facility] in the near future   I sent a MyChart message to Dr. Saintclair and copied report

## 2024-07-23 ENCOUNTER — Inpatient Hospital Stay

## 2024-07-23 VITALS — BP 120/51 | HR 80 | Temp 98.0°F | Resp 18

## 2024-07-23 DIAGNOSIS — D696 Thrombocytopenia, unspecified: Secondary | ICD-10-CM

## 2024-07-23 MED ORDER — SODIUM CHLORIDE 0.9 % IV SOLN: Freq: Once | INTRAVENOUS | Status: AC

## 2024-07-23 MED ORDER — IRON SUCROSE 20 MG/ML IV SOLN
200.0000 mg | Freq: Once | INTRAVENOUS | Status: AC
  Administered 2024-07-23: 200 mg via INTRAVENOUS
  Filled 2024-07-23: qty 10

## 2024-07-23 NOTE — Patient Instructions (Signed)

## 2024-07-27 ENCOUNTER — Other Ambulatory Visit (HOSPITAL_COMMUNITY): Payer: Self-pay

## 2024-07-30 ENCOUNTER — Other Ambulatory Visit: Payer: Self-pay | Admitting: Family Medicine

## 2024-07-30 ENCOUNTER — Inpatient Hospital Stay: Attending: Family

## 2024-07-30 ENCOUNTER — Other Ambulatory Visit: Payer: Self-pay | Admitting: Hematology & Oncology

## 2024-07-30 VITALS — BP 116/56 | HR 82 | Temp 97.8°F | Resp 20 | Wt 184.0 lb

## 2024-07-30 DIAGNOSIS — K7581 Nonalcoholic steatohepatitis (NASH): Secondary | ICD-10-CM | POA: Diagnosis not present

## 2024-07-30 DIAGNOSIS — Z79899 Other long term (current) drug therapy: Secondary | ICD-10-CM | POA: Diagnosis not present

## 2024-07-30 DIAGNOSIS — J449 Chronic obstructive pulmonary disease, unspecified: Secondary | ICD-10-CM | POA: Diagnosis not present

## 2024-07-30 DIAGNOSIS — D500 Iron deficiency anemia secondary to blood loss (chronic): Secondary | ICD-10-CM | POA: Insufficient documentation

## 2024-07-30 DIAGNOSIS — R161 Splenomegaly, not elsewhere classified: Secondary | ICD-10-CM | POA: Diagnosis not present

## 2024-07-30 DIAGNOSIS — M25559 Pain in unspecified hip: Secondary | ICD-10-CM

## 2024-07-30 DIAGNOSIS — F419 Anxiety disorder, unspecified: Secondary | ICD-10-CM

## 2024-07-30 DIAGNOSIS — D696 Thrombocytopenia, unspecified: Secondary | ICD-10-CM

## 2024-07-30 DIAGNOSIS — D6959 Other secondary thrombocytopenia: Secondary | ICD-10-CM | POA: Insufficient documentation

## 2024-07-30 DIAGNOSIS — M545 Low back pain, unspecified: Secondary | ICD-10-CM

## 2024-07-30 MED ORDER — IRON SUCROSE 20 MG/ML IV SOLN
200.0000 mg | Freq: Once | INTRAVENOUS | Status: AC
  Administered 2024-07-30: 200 mg via INTRAVENOUS
  Filled 2024-07-30: qty 10

## 2024-07-30 MED ORDER — SODIUM CHLORIDE 0.9 % IV SOLN: INTRAVENOUS | Status: DC

## 2024-07-30 NOTE — Patient Instructions (Signed)

## 2024-08-06 ENCOUNTER — Inpatient Hospital Stay

## 2024-08-08 ENCOUNTER — Emergency Department (HOSPITAL_BASED_OUTPATIENT_CLINIC_OR_DEPARTMENT_OTHER)

## 2024-08-08 ENCOUNTER — Encounter (HOSPITAL_BASED_OUTPATIENT_CLINIC_OR_DEPARTMENT_OTHER): Payer: Self-pay | Admitting: Emergency Medicine

## 2024-08-08 ENCOUNTER — Other Ambulatory Visit: Payer: Self-pay

## 2024-08-08 ENCOUNTER — Inpatient Hospital Stay (HOSPITAL_BASED_OUTPATIENT_CLINIC_OR_DEPARTMENT_OTHER)
Admission: EM | Admit: 2024-08-08 | Discharge: 2024-08-12 | DRG: 190 | Disposition: A | Discharge status: Home / Self Care | Attending: Internal Medicine | Admitting: Internal Medicine

## 2024-08-08 DIAGNOSIS — E782 Mixed hyperlipidemia: Secondary | ICD-10-CM | POA: Diagnosis present

## 2024-08-08 DIAGNOSIS — K580 Irritable bowel syndrome with diarrhea: Secondary | ICD-10-CM | POA: Diagnosis present

## 2024-08-08 DIAGNOSIS — E1165 Type 2 diabetes mellitus with hyperglycemia: Secondary | ICD-10-CM | POA: Diagnosis not present

## 2024-08-08 DIAGNOSIS — K2100 Gastro-esophageal reflux disease with esophagitis, without bleeding: Secondary | ICD-10-CM | POA: Diagnosis present

## 2024-08-08 DIAGNOSIS — Z79899 Other long term (current) drug therapy: Secondary | ICD-10-CM | POA: Diagnosis not present

## 2024-08-08 DIAGNOSIS — Z90710 Acquired absence of both cervix and uterus: Secondary | ICD-10-CM

## 2024-08-08 DIAGNOSIS — Z1152 Encounter for screening for COVID-19: Secondary | ICD-10-CM | POA: Diagnosis not present

## 2024-08-08 DIAGNOSIS — D696 Thrombocytopenia, unspecified: Secondary | ICD-10-CM | POA: Diagnosis present

## 2024-08-08 DIAGNOSIS — Z87891 Personal history of nicotine dependence: Secondary | ICD-10-CM | POA: Diagnosis not present

## 2024-08-08 DIAGNOSIS — E669 Obesity, unspecified: Secondary | ICD-10-CM | POA: Diagnosis present

## 2024-08-08 DIAGNOSIS — K589 Irritable bowel syndrome without diarrhea: Secondary | ICD-10-CM | POA: Diagnosis present

## 2024-08-08 DIAGNOSIS — E876 Hypokalemia: Secondary | ICD-10-CM | POA: Diagnosis present

## 2024-08-08 DIAGNOSIS — Z833 Family history of diabetes mellitus: Secondary | ICD-10-CM | POA: Diagnosis not present

## 2024-08-08 DIAGNOSIS — Z91013 Allergy to seafood: Secondary | ICD-10-CM | POA: Diagnosis not present

## 2024-08-08 DIAGNOSIS — Z888 Allergy status to other drugs, medicaments and biological substances status: Secondary | ICD-10-CM

## 2024-08-08 DIAGNOSIS — J441 Chronic obstructive pulmonary disease with (acute) exacerbation: Principal | ICD-10-CM | POA: Diagnosis present

## 2024-08-08 DIAGNOSIS — I10 Essential (primary) hypertension: Secondary | ICD-10-CM | POA: Diagnosis present

## 2024-08-08 DIAGNOSIS — J9601 Acute respiratory failure with hypoxia: Secondary | ICD-10-CM | POA: Diagnosis present

## 2024-08-08 DIAGNOSIS — Z713 Dietary counseling and surveillance: Secondary | ICD-10-CM | POA: Diagnosis not present

## 2024-08-08 DIAGNOSIS — Z885 Allergy status to narcotic agent status: Secondary | ICD-10-CM | POA: Diagnosis not present

## 2024-08-08 DIAGNOSIS — F419 Anxiety disorder, unspecified: Secondary | ICD-10-CM | POA: Diagnosis present

## 2024-08-08 DIAGNOSIS — Z66 Do not resuscitate: Secondary | ICD-10-CM | POA: Diagnosis present

## 2024-08-08 DIAGNOSIS — Z6829 Body mass index (BMI) 29.0-29.9, adult: Secondary | ICD-10-CM

## 2024-08-08 DIAGNOSIS — Z96652 Presence of left artificial knee joint: Secondary | ICD-10-CM | POA: Diagnosis present

## 2024-08-08 DIAGNOSIS — J4489 Other specified chronic obstructive pulmonary disease: Secondary | ICD-10-CM

## 2024-08-08 DIAGNOSIS — Z9049 Acquired absence of other specified parts of digestive tract: Secondary | ICD-10-CM

## 2024-08-08 DIAGNOSIS — K7581 Nonalcoholic steatohepatitis (NASH): Secondary | ICD-10-CM | POA: Diagnosis present

## 2024-08-08 DIAGNOSIS — T380X5A Adverse effect of glucocorticoids and synthetic analogues, initial encounter: Secondary | ICD-10-CM | POA: Diagnosis not present

## 2024-08-08 DIAGNOSIS — K7460 Unspecified cirrhosis of liver: Secondary | ICD-10-CM | POA: Diagnosis present

## 2024-08-08 DIAGNOSIS — Z90722 Acquired absence of ovaries, bilateral: Secondary | ICD-10-CM

## 2024-08-08 LAB — CBC WITH DIFFERENTIAL/PLATELET
Abs Immature Granulocytes: 0.17 K/uL — ABNORMAL HIGH (ref 0.00–0.07)
Basophils Absolute: 0.1 K/uL (ref 0.0–0.1)
Basophils Relative: 1 %
Eosinophils Absolute: 0 K/uL (ref 0.0–0.5)
Eosinophils Relative: 0 %
HCT: 36.2 % (ref 36.0–46.0)
Hemoglobin: 12.5 g/dL (ref 12.0–15.0)
Immature Granulocytes: 2 %
Lymphocytes Relative: 14 %
Lymphs Abs: 1.4 K/uL (ref 0.7–4.0)
MCH: 28.7 pg (ref 26.0–34.0)
MCHC: 34.5 g/dL (ref 30.0–36.0)
MCV: 83.2 fL (ref 80.0–100.0)
Monocytes Absolute: 1 K/uL (ref 0.1–1.0)
Monocytes Relative: 10 %
Neutro Abs: 7.7 K/uL (ref 1.7–7.7)
Neutrophils Relative %: 73 %
Platelets: 93 K/uL — ABNORMAL LOW (ref 150–400)
RBC: 4.35 MIL/uL (ref 3.87–5.11)
RDW: 16.8 % — ABNORMAL HIGH (ref 11.5–15.5)
WBC: 10.4 K/uL (ref 4.0–10.5)
nRBC: 0 % (ref 0.0–0.2)

## 2024-08-08 LAB — RESP PANEL BY RT-PCR (RSV, FLU A&B, COVID)  RVPGX2
Influenza A by PCR: NEGATIVE
Influenza B by PCR: NEGATIVE
Resp Syncytial Virus by PCR: NEGATIVE
SARS Coronavirus 2 by RT PCR: NEGATIVE

## 2024-08-08 LAB — BASIC METABOLIC PANEL WITH GFR
Anion gap: 15 (ref 5–15)
BUN: 15 mg/dL (ref 8–23)
CO2: 29 mmol/L (ref 22–32)
Calcium: 9.7 mg/dL (ref 8.9–10.3)
Chloride: 95 mmol/L — ABNORMAL LOW (ref 98–111)
Creatinine, Ser: 0.67 mg/dL (ref 0.44–1.00)
GFR, Estimated: >60 mL/min (ref >60)
Glucose, Bld: 150 mg/dL — ABNORMAL HIGH (ref 70–99)
Potassium: 3.1 mmol/L — ABNORMAL LOW (ref 3.5–5.1)
Sodium: 139 mmol/L (ref 135–145)

## 2024-08-08 LAB — GLUCOSE, CAPILLARY: Glucose-Capillary: 364 mg/dL — ABNORMAL HIGH (ref 70–99)

## 2024-08-08 MED ORDER — ALBUTEROL SULFATE (2.5 MG/3ML) 0.083% IN NEBU
2.5000 mg | INHALATION_SOLUTION | RESPIRATORY_TRACT | Status: DC | PRN
  Filled 2024-08-08: qty 3

## 2024-08-08 MED ORDER — MAGNESIUM SULFATE 2 GM/50ML IV SOLN
2.0000 g | Freq: Once | INTRAVENOUS | Status: AC
  Administered 2024-08-08: 2 g via INTRAVENOUS
  Filled 2024-08-08: qty 50

## 2024-08-08 MED ORDER — POTASSIUM CHLORIDE 20 MEQ PO PACK
40.0000 meq | PACK | Freq: Once | ORAL | Status: AC
  Administered 2024-08-08: 40 meq via ORAL
  Filled 2024-08-08: qty 2

## 2024-08-08 MED ORDER — ALBUTEROL (5 MG/ML) CONTINUOUS INHALATION SOLN: 10.0000 mg/h | INHALATION_SOLUTION | Freq: Once | RESPIRATORY_TRACT | Status: DC

## 2024-08-08 MED ORDER — COLESTIPOL HCL 1 G PO TABS: 1.0000 g | ORAL_TABLET | Freq: Every day | ORAL | Status: DC

## 2024-08-08 MED ORDER — ONDANSETRON HCL 4 MG/2ML IJ SOLN
4.0000 mg | Freq: Once | INTRAMUSCULAR | Status: AC
  Administered 2024-08-08: 4 mg via INTRAVENOUS
  Filled 2024-08-08: qty 2

## 2024-08-08 MED ORDER — SUCRALFATE 1 G PO TABS: 1.0000 g | ORAL_TABLET | Freq: Three times a day (TID) | ORAL | Status: DC

## 2024-08-08 MED ORDER — POTASSIUM CHLORIDE CRYS ER 20 MEQ PO TBCR: 40.0000 meq | EXTENDED_RELEASE_TABLET | Freq: Once | ORAL | Status: DC

## 2024-08-08 MED ORDER — PREDNISONE 20 MG PO TABS: 40.0000 mg | ORAL_TABLET | Freq: Every day | ORAL | Status: DC

## 2024-08-08 MED ORDER — ONDANSETRON 4 MG PO TBDP: 4.0000 mg | ORAL_TABLET | Freq: Once | ORAL | Status: DC

## 2024-08-08 MED ORDER — IPRATROPIUM-ALBUTEROL 0.5-2.5 (3) MG/3ML IN SOLN
3.0000 mL | Freq: Once | RESPIRATORY_TRACT | Status: AC
  Administered 2024-08-08: 3 mL via RESPIRATORY_TRACT
  Filled 2024-08-08: qty 3

## 2024-08-08 MED ORDER — INSULIN ASPART 100 UNIT/ML IJ SOLN
0.0000 [IU] | Freq: Every day | INTRAMUSCULAR | Status: DC
  Administered 2024-08-08: 5 [IU] via SUBCUTANEOUS
  Filled 2024-08-08: qty 5

## 2024-08-08 MED ORDER — DOXYCYCLINE HYCLATE 100 MG PO TABS
100.0000 mg | ORAL_TABLET | Freq: Two times a day (BID) | ORAL | Status: DC
  Administered 2024-08-08 – 2024-08-12 (×8): 100 mg via ORAL
  Filled 2024-08-08 (×10): qty 1

## 2024-08-08 MED ORDER — IPRATROPIUM-ALBUTEROL 0.5-2.5 (3) MG/3ML IN SOLN
3.0000 mL | Freq: Four times a day (QID) | RESPIRATORY_TRACT | Status: DC
  Administered 2024-08-09 – 2024-08-12 (×10): 3 mL via RESPIRATORY_TRACT
  Filled 2024-08-08 (×18): qty 3

## 2024-08-08 MED ORDER — ENOXAPARIN SODIUM 40 MG/0.4ML IJ SOSY
40.0000 mg | PREFILLED_SYRINGE | INTRAMUSCULAR | Status: DC
  Administered 2024-08-09: 40 mg via SUBCUTANEOUS
  Filled 2024-08-08: qty 0.4

## 2024-08-08 MED ORDER — METHYLPREDNISOLONE SODIUM SUCC 125 MG IJ SOLR
125.0000 mg | Freq: Once | INTRAMUSCULAR | Status: AC
  Administered 2024-08-08: 125 mg via INTRAVENOUS
  Filled 2024-08-08: qty 2

## 2024-08-08 MED ORDER — INSULIN ASPART 100 UNIT/ML IJ SOLN
0.0000 [IU] | Freq: Three times a day (TID) | INTRAMUSCULAR | Status: DC
  Administered 2024-08-09: 3 [IU] via SUBCUTANEOUS
  Administered 2024-08-09: 7 [IU] via SUBCUTANEOUS
  Filled 2024-08-08: qty 7
  Filled 2024-08-08: qty 3

## 2024-08-08 MED ORDER — ALPRAZOLAM 0.5 MG PO TABS
0.5000 mg | ORAL_TABLET | Freq: Three times a day (TID) | ORAL | Status: DC
  Administered 2024-08-08 – 2024-08-12 (×11): 0.5 mg via ORAL
  Filled 2024-08-08 (×2): qty 1
  Filled 2024-08-08: qty 6
  Filled 2024-08-08 (×16): qty 1

## 2024-08-08 MED ORDER — IPRATROPIUM-ALBUTEROL 0.5-2.5 (3) MG/3ML IN SOLN
RESPIRATORY_TRACT | Status: AC
  Filled 2024-08-08: qty 3

## 2024-08-08 MED ORDER — METHYLPREDNISOLONE SODIUM SUCC 40 MG IJ SOLR
40.0000 mg | Freq: Two times a day (BID) | INTRAMUSCULAR | Status: DC
  Administered 2024-08-09: 40 mg via INTRAVENOUS
  Filled 2024-08-08: qty 1

## 2024-08-08 MED ORDER — LOSARTAN POTASSIUM 50 MG PO TABS
50.0000 mg | ORAL_TABLET | Freq: Every day | ORAL | Status: DC
  Administered 2024-08-09 – 2024-08-12 (×4): 50 mg via ORAL
  Filled 2024-08-08 (×5): qty 1

## 2024-08-08 MED ORDER — LACTATED RINGERS IV SOLN: INTRAVENOUS | Status: DC

## 2024-08-08 MED ORDER — ALBUTEROL SULFATE (2.5 MG/3ML) 0.083% IN NEBU
INHALATION_SOLUTION | RESPIRATORY_TRACT | Status: AC
  Administered 2024-08-08: 10 mg
  Filled 2024-08-08: qty 12

## 2024-08-08 MED ORDER — SERTRALINE HCL 50 MG PO TABS
150.0000 mg | ORAL_TABLET | Freq: Every day | ORAL | Status: DC
  Administered 2024-08-09 – 2024-08-12 (×4): 150 mg via ORAL
  Filled 2024-08-08 (×5): qty 1

## 2024-08-08 NOTE — ED Provider Notes (Signed)
 [place] [facility] Provider Note   CSN: [id number] Arrival date & time: 08/08/24  1353     Patient presents with: Shortness of Breath   Rachael Buck is a 83 y.o. female.  Past medical history significant for asthma, COPD, diabetes, and hypertension presents today for shortness of breath x 1 week that worsened over the last 2 days.  Patient also reports cough.    Shortness of Breath Associated symptoms: cough        Prior to Admission medications   Medication Sig Start Date End Date Taking? Authorizing Provider  acetaminophen  (TYLENOL ) 500 MG tablet Take 500 mg by mouth every 6 (six) hours as needed for mild pain.    [provider]  albuterol  (PROAIR  HFA) 108 (90 Base) MCG/ACT inhaler Inhale 2 puffs into the lungs every 4 (four) hours as needed for wheezing or shortness of breath. 09/02/16   Tabori, Katherine E, MD  ALPRAZolam  (XANAX ) 0.5 MG tablet TAKE 1 TABLET(0.5 MG) BY MOUTH THREE TIMES DAILY 07/30/24   Copland, Harlene BROCKS, MD  avatrombopag maleate  (DOPTELET ) 20 MG tablet Take 2 tablets (40 mg total) by mouth daily for 5 consecutive days beginning 10 to 13 days prior to scheduled procedure. Take with food. 01/19/24   Tonette Lauraine HERO, PA-C  bismuth subsalicylate (PEPTO BISMOL) 262 MG/15ML suspension Take 30 mLs by mouth every 4 (four) hours as needed.    [provider]  blood glucose meter kit and supplies KIT Dispense based on patient and insurance preference. Use up to four times daily as directed. 10/29/20   Copland, Jessica C, MD  Blood Glucose Monitoring Suppl (ONE TOUCH ULTRA 2) w/Device KIT Use to check blood glucose 1-2x daily 06/11/24   Copland, Harlene BROCKS, MD  Blood Glucose Monitoring Suppl DEVI 1 each by Does not apply route in the morning, at noon, and at bedtime. May substitute to any manufacturer covered by patient's insurance. 02/08/24   Copland, Harlene BROCKS, MD  cephALEXin  (KEFLEX ) 500 MG capsule Take 500 mg by  mouth 2 (two) times daily. Patient not taking: Reported on 07/16/2024 02/13/24   [provider]  colestipol  (COLESTID ) 1 g tablet 1 g daily.    [provider]  dapagliflozin  propanediol (FARXIGA ) 10 MG TABS tablet Take 1 tablet (10 mg total) by mouth daily before breakfast. 02/08/24   Copland, Jessica C, MD  glucose blood (ONETOUCH ULTRA) test strip TEST BLOOD SUGAR(S) ONCE DAILY IN THE MORNING 06/09/22   Copland, Harlene BROCKS, MD  hydrochlorothiazide  (HYDRODIURIL ) 25 MG tablet TAKE 1 TABLET(25 MG) BY MOUTH DAILY 04/18/24   Copland, Harlene BROCKS, MD  hydrocortisone  2.5 % cream Apply 1 application  topically daily as needed (itchy dry skin).    [provider]  ipratropium-albuterol  (DUONEB) 0.5-2.5 (3) MG/3ML SOLN Use one 3 ml neb treatment as needed every 6-8 hours prn 10/04/22   Copland, Harlene BROCKS, MD  loperamide  (IMODIUM ) 2 MG capsule Take 4-6 mg by mouth daily as needed (for IBS symptoms).    [provider]  losartan  (COZAAR ) 50 MG tablet TAKE 1 TABLET(50 MG) BY MOUTH DAILY 07/11/24   Copland, Jessica C, MD  Melatonin 10 MG CAPS Take 20 mg by mouth at bedtime as needed.    [provider]  [facility] DELICA LANCETS FINE MISC 1 each by Other route daily. 05/18/18   Copland, Harlene BROCKS, MD  sertraline  (ZOLOFT ) 100 MG tablet Take 1.5 tablets (150 mg total) by mouth daily. 06/11/24  Copland, Harlene BROCKS, MD  simvastatin  (ZOCOR ) 80 MG tablet Take 1 tablet (80 mg total) by mouth daily. 02/13/24   Copland, Harlene BROCKS, MD  STIOLTO RESPIMAT  2.5-2.5 MCG/ACT AERS INHALE 2 PUFFS INTO THE LUNGS DAILY 08/17/23   Byrum, Robert S, MD  sucralfate (CARAFATE) 1 g tablet Take 1 g by mouth 4 (four) times daily as needed. 01/04/22   [provider]  Tiotropium Bromide-Olodaterol (STIOLTO RESPIMAT ) 2.5-2.5 MCG/ACT AERS Inhale 2 puffs into the lungs daily. 08/19/23   Shelah Lamar RAMAN, MD  traMADol  (ULTRAM ) 50 MG tablet TAKE 1 TABLET(50 MG) BY MOUTH EVERY 6 HOURS AS NEEDED 07/30/24    Timmy Maude SAUNDERS, MD    Allergies: Codeine , Metformin  and related, and Shrimp [shellfish allergy]    Review of Systems  Respiratory:  Positive for cough and shortness of breath.     Updated Vital Signs BP 113/89   Pulse 98   Temp 98.1 F (36.7 C) (Oral)   Resp 13   Ht 5' 6 (1.676 m)   Wt 83 kg   SpO2 94%   BMI 29.54 kg/m   Physical Exam Vitals and nursing note reviewed.  Constitutional:      General: She is not in acute distress.    Appearance: She is well-developed. She is not toxic-appearing.  HENT:     Head: Normocephalic and atraumatic.  Eyes:     Extraocular Movements: Extraocular movements intact.     Conjunctiva/sclera: Conjunctivae normal.  Neck:     Vascular: No JVD.  Cardiovascular:     Rate and Rhythm: Normal rate and regular rhythm.     Pulses: Normal pulses.     Heart sounds: Normal heart sounds. No murmur heard. Pulmonary:     Effort: Pulmonary effort is normal. Tachypnea present. No respiratory distress.     Breath sounds: Examination of the right-upper field reveals wheezing. Examination of the left-upper field reveals wheezing. Examination of the right-middle field reveals wheezing. Examination of the left-middle field reveals wheezing. Examination of the right-lower field reveals wheezing. Examination of the left-lower field reveals wheezing. Wheezing present.  Abdominal:     Palpations: Abdomen is soft.     Tenderness: There is no abdominal tenderness.  Musculoskeletal:        General: No swelling.     Cervical back: Neck supple.     Right lower leg: No edema.     Left lower leg: No edema.  Skin:    General: Skin is warm and dry.     Capillary Refill: Capillary refill takes less than 2 seconds.  Neurological:     General: No focal deficit present.     Mental Status: She is alert and oriented to person, place, and time.  Psychiatric:        Mood and Affect: Mood normal.     (all labs ordered are listed, but only abnormal results are  displayed) Labs Reviewed  BASIC METABOLIC PANEL WITH GFR - Abnormal; Notable for the following components:      Result Value   Potassium 3.1 (*)    Chloride 95 (*)    Glucose, Bld 150 (*)    All other components within normal limits  CBC WITH DIFFERENTIAL/PLATELET - Abnormal; Notable for the following components:   RDW 16.8 (*)    Platelets 93 (*)    Abs Immature Granulocytes 0.17 (*)    All other components within normal limits  RESP PANEL BY RT-PCR (RSV, FLU A&B, COVID)  [id number]  EKG: None  Radiology: DG Chest 2 View Result Date: 08/08/2024 CLINICAL DATA:  Cough, short of breath, hypoxia EXAM: CHEST - 2 VIEW COMPARISON:  06/09/2022 FINDINGS: Frontal and lateral views of the chest demonstrate an unremarkable cardiac silhouette. No acute airspace disease, effusion, or pneumothorax. There are no acute bony abnormalities. IMPRESSION: 1. No acute intrathoracic process. Electronically Signed   By: Ozell Daring M.D.   On: 08/08/2024 15:24     Procedures   Medications Ordered in the ED  albuterol  (PROVENTIL ,VENTOLIN ) solution continuous neb (10 mg/hr Nebulization Not Given 08/08/24 1653)  methylPREDNISolone  sodium succinate (SOLU-MEDROL ) 125 mg/2 mL injection 125 mg (125 mg Intravenous Given 08/08/24 1503)  magnesium  sulfate IVPB 2 g 50 mL (0 g Intravenous Stopped 08/08/24 1558)  ipratropium-albuterol  (DUONEB) 0.5-2.5 (3) MG/3ML nebulizer solution 3 mL (3 mLs Nebulization Given 08/08/24 1524)  ondansetron  (ZOFRAN ) injection 4 mg (4 mg Intravenous Given 08/08/24 1515)  potassium chloride  (KLOR-CON ) packet 40 mEq (40 mEq Oral Given 08/08/24 1639)  albuterol  (PROVENTIL ) (2.5 MG/3ML) 0.083% nebulizer solution (10 mg  Given 08/08/24 1656)                                    Medical Decision Making Amount and/or Complexity of Data Reviewed Labs: ordered. Radiology: ordered.  Risk Prescription drug management. Decision regarding hospitalization.   This patient presents to the  ED for concern of shortness of breath, this involves an extensive number of treatment options, and is a complaint that carries with it a high risk of complications and morbidity.  The differential diagnosis includes asthma exacerbation, COPD exacerbation, PE, CHF, COVID, flu, RSV, pneumonia   Co morbidities / Chronic conditions that complicate the patient evaluation  Asthma, COPD   Additional history obtained:  Additional history obtained from EMR External records from outside source obtained and reviewed including oncology and family medicine notes   Lab Tests:  I Ordered, and personally interpreted labs.  The pertinent results include: Negative respiratory panel, CBC unremarkable, mild hypokalemia at 3.1,   Imaging Studies ordered:  I ordered imaging studies including chest x-ray I independently visualized and interpreted imaging which showed no acute intrathoracic process I agree with the radiologist interpretation   Cardiac Monitoring: / EKG:  The patient was maintained on a cardiac monitor.  I personally viewed and interpreted the cardiac monitored which showed an underlying rhythm of: Sinus rhythm, RBBB   Problem List / ED Course / Critical interventions / Medication management I ordered medication including Solu-Medrol , magnesium , DuoNeb, Zofran , potassium, continuous neb Reevaluation of the patient after these medicines showed that the patient still wheezing and increased work of breathing I have reviewed the patients home medicines and have made adjustments as needed Patient ambulated on pulse ox after continuous neb solution, Solu-Medrol , and magnesium  patient with increased work of breathing, tachypnea, and SpO2 dropping as low as 83% on room air.   Consultations Obtained:  I requested consultation with the hospitalist, Dr. Dena,  and discussed lab and imaging findings as well as pertinent plan - they recommend: agreeable to admission   Test / Admission -  Considered:  Admit      Final diagnoses:  COPD exacerbation (HCC)  Acute respiratory failure with hypoxia [facility])    ED Discharge Orders     None          Francis Ileana SAILOR, PA-C 08/08/24 1857    Yolande Lamar BROCKS, MD 08/10/24 [phone]

## 2024-08-08 NOTE — H&P (Signed)
 History and Physical    Patient: Rachael Buck FMW:[id number] DOB: 1941/04/20 DOA: 08/08/2024 DOS: the patient was seen and examined on 08/08/2024 PCP: Copland, Harlene BROCKS, MD  Patient coming from: Home  Chief Complaint:  Chief Complaint  Patient presents with   Shortness of Breath   HPI: Rachael Buck is a 83 y.o. female with medical history significant of COPD with asthma, anxiety disorder, asthma, nonalcoholic cirrhosis, hyperlipidemia, GERD, type 2 diabetes, essential hypertension, diverticulitis, who presents to the ER at [facility] complaining of shortness of breath cough and wheezing for a few days.  Patient has been having cold symptoms apparently and was having oxygen  saturation of 80% at home.  She is not on home O2 but he has now required oxygen .  Patient on 2 L of oxygen  per minute.  Patient was given treatment in the ER and still having symptoms.  Patient was therefore admitted for further evaluation and treatment of acute exacerbation of COPD.  Denied any fever or chills.  No sick contacts.  Patient still wheezing.  Review of Systems: As mentioned in the history of present illness. All other systems reviewed and are negative. Past Medical History:  Diagnosis Date   Anxiety    Arthritis    Asthma    Cirrhosis, nonalcoholic (HCC)    Colon polyps    Diabetes mellitus    Diverticulitis    GERD (gastroesophageal reflux disease)    Hyperlipidemia    Hypertension    NASH (nonalcoholic steatohepatitis)    Obesity    Pneumonia    Past Surgical History:  Procedure Laterality Date   CHOLECYSTECTOMY  1981   ENDOVENOUS ABLATION SAPHENOUS VEIN W/ LASER Left 02/21/2018   endovenous laser ablation left greater saphenous vein by Lynwood Collum MD    OOPHORECTOMY  1973   PARTIAL HYSTERECTOMY  1972   ROTATOR CUFF REPAIR     bilateral. 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 Her smoking use included cigarettes. She started smoking about 52 years ago. She has a 30 pack-year smoking history. She has never used smokeless tobacco. She reports that she does not drink alcohol and does not use drugs.  Allergies  Allergen Reactions   Codeine  Nausea And Vomiting   Metformin  And Related Diarrhea   Shrimp [Shellfish Allergy] Swelling    Family History  Problem Relation Age of Onset   Stomach cancer Mother    Diabetes Sister    Breast cancer Daughter 50   Breast cancer Maternal Aunt    Diabetes Brother    Cancer Other    GER disease Other    Obesity Other     Prior to Admission medications   Medication Sig Start Date End Date Taking? Authorizing Provider  acetaminophen  (TYLENOL ) 500 MG tablet Take 500 mg by mouth every 6 (six) hours as needed for mild pain.    [provider]  albuterol  (PROAIR  HFA) 108 (90 Base) MCG/ACT inhaler Inhale 2 puffs into the lungs every 4 (four) hours as needed for wheezing or shortness of breath. 09/02/16   Tabori, Katherine E, MD  ALPRAZolam  (XANAX ) 0.5 MG tablet TAKE 1 TABLET(0.5 MG) BY MOUTH THREE TIMES DAILY 07/30/24   Copland, Jessica C,  MD  avatrombopag maleate  (DOPTELET ) 20 MG tablet Take 2 tablets (40 mg total) by mouth daily for 5 consecutive days beginning 10 to 13 days prior to scheduled procedure. Take with food. 01/19/24   Tonette Lauraine HERO, PA-C  bismuth subsalicylate (PEPTO BISMOL) 262 MG/15ML suspension Take 30 mLs by mouth every 4 (four) hours as needed.    [provider]  blood glucose meter kit and supplies KIT Dispense based on patient and insurance preference. Use up to four times daily as directed. 10/29/20   Copland, Jessica C, MD  Blood Glucose Monitoring Suppl (ONE TOUCH ULTRA 2) w/Device KIT Use to check blood glucose 1-2x daily  06/11/24   Copland, Harlene BROCKS, MD  Blood Glucose Monitoring Suppl DEVI 1 each by Does not apply route in the morning, at noon, and at bedtime. May substitute to any manufacturer covered by patient's insurance. 02/08/24   Copland, Harlene BROCKS, MD  cephALEXin  (KEFLEX ) 500 MG capsule Take 500 mg by mouth 2 (two) times daily. Patient not taking: Reported on 07/16/2024 02/13/24   [provider]  colestipol  (COLESTID ) 1 g tablet 1 g daily.    [provider]  dapagliflozin  propanediol (FARXIGA ) 10 MG TABS tablet Take 1 tablet (10 mg total) by mouth daily before breakfast. 02/08/24   Copland, Jessica C, MD  glucose blood (ONETOUCH ULTRA) test strip TEST BLOOD SUGAR(S) ONCE DAILY IN THE MORNING 06/09/22   Copland, Harlene BROCKS, MD  hydrochlorothiazide  (HYDRODIURIL ) 25 MG tablet TAKE 1 TABLET(25 MG) BY MOUTH DAILY 04/18/24   Copland, Harlene BROCKS, MD  hydrocortisone  2.5 % cream Apply 1 application  topically daily as needed (itchy dry skin).    [provider]  ipratropium-albuterol  (DUONEB) 0.5-2.5 (3) MG/3ML SOLN Use one 3 ml neb treatment as needed every 6-8 hours prn 10/04/22   Copland, Harlene BROCKS, MD  loperamide  (IMODIUM ) 2 MG capsule Take 4-6 mg by mouth daily as needed (for IBS symptoms).    [provider]  losartan  (COZAAR ) 50 MG tablet TAKE 1 TABLET(50 MG) BY MOUTH DAILY 07/11/24   Copland, Jessica C, MD  Melatonin 10 MG CAPS Take 20 mg by mouth at bedtime as needed.    [provider]  [facility] DELICA LANCETS FINE MISC 1 each by Other route daily. 05/18/18   Copland, Harlene BROCKS, MD  sertraline  (ZOLOFT ) 100 MG tablet Take 1.5 tablets (150 mg total) by mouth daily. 06/11/24   Copland, Harlene BROCKS, MD  simvastatin  (ZOCOR ) 80 MG tablet Take 1 tablet (80 mg total) by mouth daily. 02/13/24   Copland, Harlene BROCKS, MD  STIOLTO RESPIMAT  2.5-2.5 MCG/ACT AERS INHALE 2 PUFFS INTO THE LUNGS DAILY 08/17/23   Byrum, Robert S, MD  sucralfate (CARAFATE) 1 g tablet Take 1 g by mouth 4 (four)  times daily as needed. 01/04/22   [provider]  Tiotropium Bromide-Olodaterol (STIOLTO RESPIMAT ) 2.5-2.5 MCG/ACT AERS Inhale 2 puffs into the lungs daily. 08/19/23   Shelah Lamar RAMAN, MD  traMADol  (ULTRAM ) 50 MG tablet TAKE 1 TABLET(50 MG) BY MOUTH EVERY 6 HOURS AS NEEDED 07/30/24   Timmy Maude SAUNDERS, MD    Physical Exam: Vitals:   08/08/24 1658 08/08/24 1700 08/08/24 1801 08/08/24 2059  BP:  113/89  130/67  Pulse:  98  (!) 105  Resp:  13  19  Temp:   98.1 F (36.7 C) 98.4 F (36.9 C)  TempSrc:   Oral Oral  SpO2: 92% 94%  96%  Weight:      Height:  Constitutional: Acutely ill looking no distress, NAD, calm, comfortable Eyes: PERRL, lids and conjunctivae normal ENMT: Mucous membranes are moist. Posterior pharynx clear of any exudate or lesions.Normal dentition.  Neck: normal, supple, no masses, no thyromegaly Respiratory: Decreased air entry bilaterally with mild expiratory wheezing,. No accessory muscle use.  Cardiovascular: Regular rate and rhythm, no murmurs / rubs / gallops. No extremity edema. 2+ pedal pulses. No carotid bruits.  Abdomen: no tenderness, no masses palpated. No hepatosplenomegaly. Bowel sounds positive.  Musculoskeletal: Good range of motion, no joint swelling or tenderness, Skin: no rashes, lesions, ulcers. No induration Neurologic: CN 2-12 grossly intact. Sensation intact, DTR normal. Strength 5/5 in all 4.  Psychiatric: Normal judgment and insight. Alert and oriented x 3. Normal mood  Data Reviewed:  Temperature 98.4, blood pressure 130/60, pulse 105 respiratory 22, potassium 3.1 and glucose 150, acute viral screen is negative for flu RSV and COVID chest x-ray showed no acute intrathoracic process EKG showed normal sinus rhythm  Assessment and Plan:  #1 acute hypoxic respiratory failure: Secondary to COPD exacerbation.  Patient will be admitted.  IV steroid, antibiotics, breathing treatment.  #2 acute exacerbation COPD: Continue steroids,  antibiotics and breathing treatment.  #3 type 2 diabetes: Sliding scale insulin .  Continue monitor.  #4 hypokalemia: Replete potassium  #5 essential hypertension: Continue with blood pressure monitoring.  #6 GERD: Continues PPIs  #7 mixed hyperlipidemia: Continue with statin  #8 morbid obesity: Dietary counseling    Advance Care Planning:   Code Status: Prior full code  Consults: None  Family Communication: No family at bedside  Severity of Illness: The appropriate patient status for this patient is INPATIENT. Inpatient status is judged to be reasonable and necessary in order to provide the required intensity of service to ensure the patient's safety. The patient's presenting symptoms, physical exam findings, and initial radiographic and laboratory data in the context of their chronic comorbidities is felt to place them at high risk for further clinical deterioration. Furthermore, it is not anticipated that the patient will be medically stable for discharge from the hospital within 2 midnights of admission.   * I certify that at the point of admission it is my clinical judgment that the patient will require inpatient hospital care spanning beyond 2 midnights from the point of admission due to high intensity of service, high risk for further deterioration and high frequency of surveillance required.*  AuthorBETHA SIM KNOLL, MD 08/08/2024 10:03 PM  For on call review [email or web address].

## 2024-08-08 NOTE — ED Notes (Signed)
 RT ambulated patient with a pulse ox. Initially was going well, 1/2 lap SAT 91% but then patient became more SOB, Audible wheezes, SAT dropped as low as 83%. Was able to recover with purse lip breathing, no oxygen  placed. SAT now 93%. PA aware and at bedside

## 2024-08-08 NOTE — ED Notes (Signed)
 RT assessed patient in waiting room. Stated she has been having cold symptoms for a few days and noted SATs in the 80's at home. SAT currently 94% and no distress noted. Triage made aware.

## 2024-08-08 NOTE — ED Triage Notes (Signed)
 Pt c/o SHOB x 1 wk, worse x 2d; cough noted; RT assessed in waiting room

## 2024-08-09 LAB — COMPREHENSIVE METABOLIC PANEL WITH GFR
ALT: 13 U/L (ref 0–44)
AST: 26 U/L (ref 15–41)
Albumin: 4.3 g/dL (ref 3.5–5.0)
Alkaline Phosphatase: 65 U/L (ref 38–126)
Anion gap: 12 (ref 5–15)
BUN: 19 mg/dL (ref 8–23)
CO2: 32 mmol/L (ref 22–32)
Calcium: 9.8 mg/dL (ref 8.9–10.3)
Chloride: 97 mmol/L — ABNORMAL LOW (ref 98–111)
Creatinine, Ser: 0.71 mg/dL (ref 0.44–1.00)
GFR, Estimated: >60 mL/min (ref >60)
Glucose, Bld: 175 mg/dL — ABNORMAL HIGH (ref 70–99)
Potassium: 3 mmol/L — ABNORMAL LOW (ref 3.5–5.1)
Sodium: 141 mmol/L (ref 135–145)
Total Bilirubin: 1 mg/dL (ref 0.0–1.2)
Total Protein: 7.7 g/dL (ref 6.5–8.1)

## 2024-08-09 LAB — CBC
HCT: 34.9 % — ABNORMAL LOW (ref 36.0–46.0)
Hemoglobin: 11.8 g/dL — ABNORMAL LOW (ref 12.0–15.0)
MCH: 29 pg (ref 26.0–34.0)
MCHC: 33.8 g/dL (ref 30.0–36.0)
MCV: 85.7 fL (ref 80.0–100.0)
Platelets: 76 K/uL — ABNORMAL LOW (ref 150–400)
RBC: 4.07 MIL/uL (ref 3.87–5.11)
RDW: 17.1 % — ABNORMAL HIGH (ref 11.5–15.5)
WBC: 8.9 K/uL (ref 4.0–10.5)
nRBC: 0 % (ref 0.0–0.2)

## 2024-08-09 LAB — HIV ANTIBODY (ROUTINE TESTING W REFLEX): HIV Screen 4th Generation wRfx: NONREACTIVE

## 2024-08-09 LAB — GLUCOSE, CAPILLARY
Glucose-Capillary: 146 mg/dL — ABNORMAL HIGH (ref 70–99)
Glucose-Capillary: 211 mg/dL — ABNORMAL HIGH (ref 70–99)
Glucose-Capillary: 153 mg/dL — ABNORMAL HIGH (ref 70–99)

## 2024-08-09 LAB — BASIC METABOLIC PANEL WITH GFR
Anion gap: 12 (ref 5–15)
BUN: 24 mg/dL — ABNORMAL HIGH (ref 8–23)
CO2: 27 mmol/L (ref 22–32)
Calcium: 9.5 mg/dL (ref 8.9–10.3)
Chloride: 99 mmol/L (ref 98–111)
Creatinine, Ser: 0.67 mg/dL (ref 0.44–1.00)
GFR, Estimated: >60 mL/min (ref >60)
Glucose, Bld: 168 mg/dL — ABNORMAL HIGH (ref 70–99)
Potassium: 3.8 mmol/L (ref 3.5–5.1)
Sodium: 138 mmol/L (ref 135–145)

## 2024-08-09 LAB — MAGNESIUM: Magnesium: 2.6 mg/dL — ABNORMAL HIGH (ref 1.7–2.4)

## 2024-08-09 MED ORDER — HYDROCHLOROTHIAZIDE 25 MG PO TABS
25.0000 mg | ORAL_TABLET | Freq: Every day | ORAL | Status: DC
  Administered 2024-08-09 – 2024-08-12 (×4): 25 mg via ORAL
  Filled 2024-08-09 (×5): qty 1

## 2024-08-09 MED ORDER — GLIPIZIDE 10 MG PO TABS
5.0000 mg | ORAL_TABLET | Freq: Every day | ORAL | Status: DC | PRN
  Filled 2024-08-09: qty 0.5

## 2024-08-09 MED ORDER — GLYBURIDE 5 MG PO TABS: 5.0000 mg | ORAL_TABLET | Freq: Every day | ORAL | Status: DC

## 2024-08-09 MED ORDER — BENZONATATE 100 MG PO CAPS
200.0000 mg | ORAL_CAPSULE | Freq: Three times a day (TID) | ORAL | Status: DC | PRN
  Administered 2024-08-10 – 2024-08-11 (×3): 200 mg via ORAL
  Filled 2024-08-09 (×6): qty 2

## 2024-08-09 MED ORDER — ATORVASTATIN CALCIUM 20 MG PO TABS
40.0000 mg | ORAL_TABLET | Freq: Every day | ORAL | Status: DC
  Administered 2024-08-09: 40 mg via ORAL
  Filled 2024-08-09: qty 2

## 2024-08-09 MED ORDER — GUAIFENESIN-DM 100-10 MG/5ML PO SYRP
5.0000 mL | ORAL_SOLUTION | ORAL | Status: DC | PRN
  Administered 2024-08-09 – 2024-08-11 (×6): 5 mL via ORAL
  Filled 2024-08-09 (×12): qty 5

## 2024-08-09 MED ORDER — ACETAMINOPHEN 500 MG PO TABS: 500.0000 mg | ORAL_TABLET | Freq: Four times a day (QID) | ORAL | Status: DC | PRN

## 2024-08-09 MED ORDER — POTASSIUM CHLORIDE CRYS ER 20 MEQ PO TBCR
40.0000 meq | EXTENDED_RELEASE_TABLET | ORAL | Status: AC
  Administered 2024-08-09 (×2): 40 meq via ORAL
  Filled 2024-08-09 (×2): qty 2

## 2024-08-09 MED ORDER — ENSURE MAX PROTEIN PO LIQD
11.0000 [oz_av] | Freq: Every day | ORAL | Status: DC
  Administered 2024-08-09 – 2024-08-11 (×2): 11 [oz_av] via ORAL
  Filled 2024-08-09 (×4): qty 330

## 2024-08-09 MED ORDER — ADULT MULTIVITAMIN W/MINERALS CH
1.0000 | ORAL_TABLET | Freq: Every day | ORAL | Status: DC
  Administered 2024-08-09 – 2024-08-12 (×4): 1 via ORAL
  Filled 2024-08-09 (×5): qty 1

## 2024-08-09 MED ORDER — ATORVASTATIN CALCIUM 40 MG PO TABS
40.0000 mg | ORAL_TABLET | Freq: Every day | ORAL | Status: DC
  Administered 2024-08-10 – 2024-08-12 (×3): 40 mg via ORAL
  Filled 2024-08-09 (×4): qty 1

## 2024-08-09 MED ORDER — PREDNISONE 20 MG PO TABS
40.0000 mg | ORAL_TABLET | Freq: Every day | ORAL | Status: DC
  Administered 2024-08-10 – 2024-08-12 (×3): 40 mg via ORAL
  Filled 2024-08-09 (×4): qty 2

## 2024-08-09 MED ORDER — GLIPIZIDE 5 MG PO TABS
5.0000 mg | ORAL_TABLET | Freq: Every day | ORAL | Status: DC | PRN
  Administered 2024-08-09: 5 mg via ORAL
  Filled 2024-08-09 (×3): qty 1

## 2024-08-09 MED ORDER — INSULIN GLARGINE 100 UNIT/ML ~~LOC~~ SOLN
6.0000 [IU] | Freq: Every day | SUBCUTANEOUS | Status: DC
  Administered 2024-08-09: 6 [IU] via SUBCUTANEOUS
  Filled 2024-08-09: qty 0.06
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
## 2024-08-09 NOTE — Progress Notes (Signed)
 Phone call completed with patient's daughter Comer. Introduced myself as the [employer] [employer] for the evening and made them aware that I am available via phone or tablet call if any needs come up overnight. Patient denies any pain or discomfort at this time and plan made for them to call me a little later to administer her bedtime medications.

## 2024-08-09 NOTE — Progress Notes (Signed)
 Meds in pantry in dining room Pt needs  Zofran  Anti-diarrheal  Tramadol 
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
## 2024-08-09 NOTE — Progress Notes (Signed)
 [facility] to [facility]. Reviewed program, pt able to receive and place call via ipad. Pt deferred skin assessment til the morning.

## 2024-08-09 NOTE — Plan of Care (Signed)
 Hospital at Home Interim Note     Rachael Buck   FMW:[id number]  DOB: May 01, 1941  DOA: 08/08/2024     1 Date of Service: 08/09/2024   HPI:  Patient is a 83 year old female with history of COPD/asthma, anxiety disorder, nonalcoholic cirrhosis, hyperlipidemia, GERD, diabetes type 2, hypertension who presented with complaint of shortness of breath, wheezing. On presentation she was hypoxic. She was put on 2 L of oxygen  per minute. She was found wheezing. Admitted for the management of acute COPD exacerbation. Started on IV steroids.   Had a lengthy discussion with patient and daughter over the phone. Both the patient and daughter are in agreement with the hospital at home program. The daughter has agreed to stay overnight with the patient over the next 2 nights. Consented obtained with patient and daughter on speakerphone.   Subjective:  Pt with mild improvement in SOB. Still with DOE and mild wheezing.   Hospital Problems Assessment and Plan: Acute resp failure w/ hypoxia COPD Exacerbation  Decompensated resp status now requiting 1-2 LNC in setting of active COPD flare  Nonfocal CXR on presentation  Initially started on IV solumedrol, po doxy and duonebs in the ER  Mild symptomatic improvement thus far  Transition to oral steroids in am  Cont duonebs and doxy  Cont supplemental O2   Type 2 DM  Noted to be on jardiance  in outpatient setting  Has had some steroid induced hyperglycemia w/ blood sugars 300s-->200s  Start on SSI with roughly 8 units of insulin  over the last 24 hours  Discussed w/ pharmacy  Anticipate downtrending hyperglycemia with decreased steroid dose Will place on glyburide for bridging into the hospital at home program  Consider low long acting insulin  (lantus 6-8 units daily) as appropriate   Hypokalemia  K 3.0  Replete  Check Mag level  Follow   HTN  BPs 120s-140s/60s-100s  Cont home regimen including losartan   Monitor   HLD Cont statin    History of NASH LFTs stable    Thrombocytopenia  Stable in setting of NASH  Plt in 70s  Hold AC  Follow closely       Objective Vital signs were reviewed and unremarkable.  Exam Physical Exam See today's progress from Dr. Jillian Leos / Other Information There are no new results to review at this time.  Hospital at Home Admission Criteria Checklist:  Formal consent explained in detail and signed at the bedside: yes Patient meets inpatient admission criteria (see below for further details) yes Is pt Medicare FFS/[employer] Medicare-Medicaid, Mult[employer], [employer] Medicare, [employer]vantage, [employer] ( required for initial launch with plan to expand)? yes Lives within 25 mil/ 30 min from [facility] within [place](pt may stay with family member during admission who lives within 25 miles or 30 min from [facility] w/in [facility])? yes Hemodynamically stable with relatively low risk of clinical deterioration-not requiring ICU? yes Age >55? yes Does not require frequent touch-points or complex interventions/medications (ie Titrated Infusions (IV insulin , heparin drips, vasoactive drips, use of infused or injectable controlled substances, patients on insulin )? no Any Behavioral Health comorbidities likely to increase risk for in-home care (ie Acute delirium or experiencing a marked altered mental status and cause is not a treatable condition in the home)? yes Has the patient been on BIPAP during course of ED evaluation or hospitalization? no IF YES, Has the patient been off of BIPAP for >24 hours(If NO-THEN PATIENT DOES MEET INCLUSION CRITERIA)? no On Room Air or Needs  oxygen  at home (<6L)? is on oxygen  at 1-2 L/min per nasal cannula. Active safety concerns (ie Unable to use bedside commode independently and lacks caregiver support for safety- needs SNF placement, unable to obtain IV access)? no Has skin check been performed? Pending  Has Physical Therapy screened the patient?  yes  Common admission diagnoses including: CAP, COPD Exacerbation, Acute on chronic heart failure, Cellulitis, UTI , dehydration, acute resp failure with hypoxia (requiring <5L)   Social Screening:  - Has the family been directly contacted about Hospital at Home program with consent obtained (if yes- please document who was spoken to with name and phone number)? yes  -Was the family approached about the use of TOC pharmacy for medications at discharge? no Denies significant ETOH intake? not applicable Does not smoke and understands may not smoke in the presence of oxygen ? not applicable Patient states able to use iPad/phone for communication/has family who is able to use? yes Patient has agreed to be compliant with medication and treatment regimen of the program? yes Any active drug use in patient or primary caregiver including daily dosing of methadone? no Stable home environment ( access to appropriate heating in cold conditions and/or appropriate air conditioning in hot conditions and/or no running water/electricity)? yes  No aggressive pets at home? no Firearm present? no  With ability or willingness to store them unloaded in a locked case for duration of hospitalization? no Ambulatory? yes  mild difficulty Bed bugs present on home evaluation? no Family support system in place? yes Patient feels safe at home and does not endorse any violence? yes Any actively decompensated behavioral health issues including agitation/aggressive behavior? no  Patient requests food to be provided by hospital home program? no PT/OT eval completed and not requiring SNF, ALF, inpatient rehab? yes  To be admitted to the Hospital at [facility] program, a patient generally must meet the following: 1. Requirement for Inpatient Level of Care: The patient's condition must necessitate an inpatient level of care. This is typically indicated by one or more of the following, depending on their specific  diagnosis:  Persistent tachycardia despite appropriate treatment (e.g., for Heart Failure, UTI). Persistent tachypnea (rapid breathing) or dyspnea (shortness of breath) that hasn't improved sufficiently with observation care (e.g., for Heart Failure, Pneumonia, Viral Illness, COVID). Hypoxemia (low oxygen  levels), such as a new need for oxygen , an increased need from baseline, or specific oxygen  saturation levels (e.g., SpO2 <90-94% depending on the condition) that persist despite observation (e.g., for Heart Failure, COPD, Pneumonia, Viral Illness, COVID). Need for Intravenous (IV) hydration due to an inability to maintain oral hydration, which persists despite observation care (e.g., for Cellulitis, UTI, Viral Illness, COVID). Specific to Heart Failure: Persistent pulmonary edema, indicated by a new oxygen  need, lack of improvement with IV diuretics, and ongoing tachypnea/dyspnea. Specific to COPD: A decrease in known baseline resting oxygen  saturation (SpO2) by 4% or more, or an increase in pre-existing supplemental oxygen  requirements, which persists despite observation and requires continued close monitoring. Specific to Pneumonia: A Pneumonia Severity Index (PSI) class IV (moderate risk). Specific to Cellulitis: Failure of outpatient antibiotic therapy (indicated by progression or no improvement after a minimum of 48 hours on an adequate regimen) or a clinical presentation (like acuity or rapidity of progression) that requires the intensity of monitoring found in an inpatient setting. Specific to UTI: Persistence or worsening of clinical findings like fever, pain, or dehydration despite observation care; presence of significant uropathy; suspected infection of an indwelling prosthetic device, stent,  implant, or graft; or pregnancy with suspected pyelonephritis.  2. Appropriateness for Hospital at Home Setting: The patient's overall clinical picture, including the severity of their illness, their  care needs, and their medical history and comorbidities, must be suitable for management in the Hospital at Home environment. This essentially means that none of the exclusion criteria (listed below) are met.  Unified Exclusion Criteria for Hospital at Home Admission: A patient would not be eligible for Hospital at Home if any of the following are present: Hemodynamic Instability: Hypotension (low blood pressure) is present. Respiratory Instability or Needs Beyond Program Capability: There is a new need for invasive or noninvasive ventilatory assistance (like BiPAP or a ventilator). Oxygenation is not sufficient, generally indicated if an FiO2 (fraction of inspired oxygen ) of 45% (which is about 6 Liters/minute via nasal cannula) or more is required to keep oxygen  saturation (SpO2) at 90% or greater. Monitoring or Procedural Needs Beyond Program Capability: There is a need for invasive monitoring, such as a pulmonary artery catheter or an arterial line. There is a need for immediate-response telemetry monitoring (for dangerous arrhythmia detection and subsequent immediate intervention). The required medication regimen is beyond the capabilities of Hospital at Home (e.g., dosing intervals are too frequent for home administration). There is a need for a procedure that cannot be performed by the Hospital at [facility] team (e.g., significant wound debridement or abscess drainage for cellulitis, or percutaneous nephrostomy for a complicated UTI). Significant Organ Dysfunction or Markers of Severe Illness: Mental status is not at baseline, or there is altered mental status suggestive of inadequate perfusion. Renal (kidney) function is unstable or showing an ongoing decline. There is evidence of inadequate perfusion, such as metabolic acidosis or myocardial ischemia. Uncompensated acidosis is present. Condition-Specific Severity or Complications Making Home Care Unsuitable: For Heart Failure: Known severe  cardiac valvular disease (e.g., aortic stenosis, mitral regurgitation); or severe peripheral edema that impairs the ability to urinate or ambulate. For COPD: Known concurrent comorbidity or finding that indicates a higher-risk COPD exacerbation (e.g., pulmonary fibrosis, cavitation, pleural effusion, pneumothorax, rib fracture). For Pneumonia: Pneumonia Severity Index (PSI) class V (indicating high risk for inpatient mortality); known concurrent comorbidity or finding that indicates higher-risk pneumonia (e.g., pulmonary fibrosis, cavitation, large or loculated pleural effusion); or a concomitant serious infectious process like endocarditis or empyema. For Cellulitis: Orbital, periorbital, or necrotizing infection is suspected; or a concomitant serious infectious process like endocarditis, septic emboli, or septic joint space infection. For UTI: Urinary tract obstruction (e.g., kidney stone, bladder outlet obstruction); or a concomitant serious infectious process like endocarditis or septic emboli. For Viral Illness & COVID-19: A concomitant serious infectious process like endocarditis or empyema.  General Comorbidities or Status:  The patient is significantly immunosuppressed (this applies to Pneumonia, Cellulitis, UTI, Viral Illness, and COVID-19). The patient meets inpatient admission criteria for a second diagnosis, or has care needs beyond the capabilities of Hospital at Home due to an active clinically significant comorbidity. (This is a general exclusion across all listed conditions)   Time spent: >60 minutes   [facility] 08/09/2024, 2:50 PM

## 2024-08-09 NOTE — Progress Notes (Signed)
 Initial Nutrition Assessment  DOCUMENTATION CODES:   Not applicable  INTERVENTION:  - Remove Heart healthy diet restriction and liberalize to Carb Modified.  - Ensure Max po daily, provides 150 kcal and 30 grams of protein.  - Multivitamin with minerals daily.  - Monitor weight trends.  NUTRITION DIAGNOSIS:   Increased nutrient needs related to chronic illness (COPD) as evidenced by estimated needs.  GOAL:   Patient will meet greater than or equal to 90% of their needs  MONITOR:   PO intake, Supplement acceptance, Weight trends  REASON FOR ASSESSMENT:   Consult Assessment of nutrition requirement/status  ASSESSMENT:    83 y.o. female with PMH significant of COPD with asthma, anxiety disorder, nonalcoholic cirrhosis, HLD, DM2, GERD, essential hypertension, diverticulitis, who presented complaining of shortness of breath, cough, and wheezing for a few days. Admitted for COPD exacerbation.    Patient reports a UBW of 189# and weight loss over the past ~1 month secondary to not eating very much with lack of appetite.  Per EMR, weight stable over the past year until the past 3 weeks when patient has had a 6# or 3% weight loss. This is not significant for the time frame.  Patient reports that at her baseline, she eats 3 meals a day. Over the past month, has only been eating small meals (yogurt, cereal) due to not feeling well with decreased appetite and increased WOB.   Thankfully, current appetite is somewhat improved and patient endorses eating well at breakfast. Encouraged intake of small and frequent meals to combat fatigue with eating and SOB. She is agreeable to try Ensure Max to support intake.    Medications reviewed and include: -  Labs reviewed:  K+ 3.0 HA1C 7.7 (as of 10/13)  NUTRITION - FOCUSED PHYSICAL EXAM:  Flowsheet Row Most Recent Value  Orbital Region No depletion  Upper Arm Region No depletion  Thoracic and Lumbar Region No depletion  Buccal Region No  depletion  Temple Region Mild depletion  Clavicle Bone Region Mild depletion  Clavicle and Acromion Bone Region Mild depletion  Scapular Bone Region Unable to assess  Dorsal Hand No depletion  Patellar Region No depletion  Anterior Thigh Region No depletion  Posterior Calf Region No depletion  Edema (RD Assessment) None  Hair Reviewed  Eyes Reviewed  Mouth Reviewed  Skin Reviewed  Nails Reviewed    Diet Order:   Diet Order             Diet Carb Modified Room service appropriate? Yes  Diet effective now                   EDUCATION NEEDS:  Education needs have been addressed  Skin:  Skin Assessment: Reviewed RN Assessment  Last BM:  unknown  Height:  Ht Readings from Last 1 Encounters:  08/08/24 5' 6 (1.676 m)   Weight:  Wt Readings from Last 1 Encounters:  08/08/24 83 kg   Ideal Body Weight:  59.09 kg  BMI:  Body mass index is 29.54 kg/m.  Estimated Nutritional Needs:  Kcal:  1600-1850 kcals Protein:  70-85 grams Fluid:  >/= 1.6L    Trude Ned RD, LDN Contact via Secure Chat.

## 2024-08-09 NOTE — Progress Notes (Signed)
 Pre-admit done with patient    she doesn't have any questions for us  at this time   she does have small dog at home   she would like some pre packaged meals to try  will call virtual RN for pre admit pause

## 2024-08-09 NOTE — Plan of Care (Signed)
°  Problem: Education: Goal: Knowledge of General Education information will improve Description: Including pain rating scale, medication(s)/side effects and non-pharmacologic comfort measures Outcome: Progressing   Problem: Health Behavior/Discharge Planning: Goal: Ability to manage health-related needs will improve Outcome: Progressing   Problem: Clinical Measurements: Goal: Respiratory complications will improve Outcome: Progressing Goal: Cardiovascular complication will be avoided Outcome: Progressing   Problem: Activity: Goal: Risk for activity intolerance will decrease Outcome: Progressing   Problem: Coping: Goal: Level of anxiety will decrease Outcome: Progressing   Problem: Pain Managment: Goal: General experience of comfort will improve and/or be controlled Outcome: Progressing   Problem: Activity: Goal: Ability to tolerate increased activity will improve Outcome: Progressing   Problem: Respiratory: Goal: Levels of oxygenation will improve Outcome: Progressing   Problem: Coping: Goal: Ability to adjust to condition or change in health will improve Outcome: Progressing

## 2024-08-09 NOTE — Progress Notes (Addendum)
 PROGRESS NOTE  Rachael Buck  FMW:[id number] DOB: Oct 15, 1940 DOA: 08/08/2024 PCP: Watt Harlene BROCKS, MD   Brief Narrative: Patient is a 83 year old female with history of COPD/asthma, anxiety disorder, nonalcoholic cirrhosis, hyperlipidemia, GERD, diabetes type 2, hypertension who presented with complaint of shortness of breath, wheezing.  On presentation she was hypoxic.  She was put on 2 L of oxygen  per minute.  She was found wheezing.  Admitted for the management of acute COPD exacerbation.  Started on IV steroids  Assessment & Plan:  Principal Problem:   COPD exacerbation (HCC) Active Problems:   Mixed hyperlipidemia   Obesity, unspecified   Reflux esophagitis   IBS (irritable bowel syndrome)   HTN (hypertension)   Thrombocytopenia   Hypokalemia   Type 2 diabetes mellitus with hyperglycemia, without long-term current use of insulin  (HCC)   Acute hypoxic respiratory failure: Secondary to COPD exacerbation.  Not on oxygen  at home.  Continue to wean  Acute COPD exacerbation: Found to be profoundly wheezing on presentation.  Started on IV steroids.  Continue bronchodilators.  Also on doxycycline   History of NASH: Has history of thrombocytopenia.  Type 2 diabetes: Monitor blood sugars.  Continue sliding scale  Hypokalemia: Continue to monitor and supplement, will check magnesium  as well  Hypertension: Monitor blood pressure.  Currently stable.  On losartan   Hyperlipidemia: On statin at home  Hypokalemia: Supplemented the potassium.  Magnesium  level optimal  Deconditioning: PT recommend home health on discharge          DVT prophylaxis:enoxaparin  (LOVENOX ) injection 40 mg Start: 08/09/24 1000     Code Status: Limited: Do not attempt resuscitation (DNR) -DNR-LIMITED -Do Not Intubate/DNI   Family Communication: Not at the bedside  Patient status: Inpatient  Patient is from : Home  Anticipated discharge to: Home   Estimated DC date: 1 to 2  days   Consultants: None  Procedures:None  Antimicrobials:  Anti-infectives (From admission, onward)    Start     Dose/Rate Route Frequency Ordered Stop   08/08/24 2300  doxycycline  (VIBRA -TABS) tablet 100 mg        100 mg Oral Every 12 hours 08/08/24 2203 08/13/24 2159       Subjective: Patient seen and examined at the bedside today.  Hemodynamically stable.  Was weaned to room air this morning.  Was getting breathing treatment.  Feels much better today.  He still has mild bilateral expiratory wheezing  Objective: Vitals:   08/08/24 1801 08/08/24 2059 08/09/24 0145 08/09/24 0643  BP:  130/67 (!) 127/103 (!) 147/108  Pulse:  (!) 105 88 (!) 101  Resp:  19 16 18   Temp: 98.1 F (36.7 C) 98.4 F (36.9 C) 98 F (36.7 C) 97.9 F (36.6 C)  TempSrc: Oral Oral    SpO2:  96% 96% 98%  Weight:      Height:        Intake/Output Summary (Last 24 hours) at 08/09/2024 9162 Last data filed at 08/09/2024 0500 Gross per 24 hour  Intake 636.69 ml  Output --  Net 636.69 ml   Filed Weights   08/08/24 1410  Weight: 83 kg    Examination:  General exam: Overall comfortable, not in distress,pleasant  elderly female HEENT: PERRL Respiratory system: Bilateral diminished air sounds, bilateral expiratory wheezing Cardiovascular system: S1 & S2 heard, RRR.  Gastrointestinal system: Abdomen is nondistended, soft and nontender. Central nervous system: Alert and oriented Extremities: No edema, no clubbing ,no cyanosis Skin: No rashes, no ulcers,no icterus  Data Reviewed: I have personally reviewed following labs and imaging studies  CBC: Recent Labs  Lab 08/08/24 1454 08/09/24 0458  WBC 10.4 8.9  NEUTROABS 7.7  --   HGB 12.5 11.8*  HCT 36.2 34.9*  MCV 83.2 85.7  PLT 93* 76*   Basic Metabolic Panel: Recent Labs  Lab 08/08/24 1454 08/09/24 0458  NA 139 141  K 3.1* 3.0*  CL 95* 97*  CO2 29 32  GLUCOSE 150* 175*  BUN 15 19  CREATININE 0.67 0.71  CALCIUM  9.7 9.8      Recent Results (from the past 240 hours)  Resp panel by RT-PCR (RSV, Flu A&B, Covid) Anterior Nasal Swab     Status: None   Collection Time: 08/08/24  2:15 PM   Specimen: Anterior Nasal Swab  Result Value Ref Range Status   SARS Coronavirus 2 by RT PCR NEGATIVE NEGATIVE Final    Comment: (NOTE) SARS-CoV-2 target nucleic acids are NOT DETECTED.  The SARS-CoV-2 RNA is generally detectable in upper respiratory specimens during the acute phase of infection. The lowest concentration of SARS-CoV-2 viral copies this assay can detect is 138 copies/mL. A negative result does not preclude SARS-Cov-2 infection and should not be used as the sole basis for treatment or other patient management decisions. A negative result may occur with  improper specimen collection/handling, submission of specimen other than nasopharyngeal swab, presence of viral mutation(s) within the areas targeted by this assay, and inadequate number of viral copies(<138 copies/mL). A negative result must be combined with clinical observations, patient history, and epidemiological information. The expected result is Negative.  Fact Sheet for Patients:  bloggercourse.com  Fact Sheet for Healthcare Providers:  seriousbroker.it  This test is no t yet approved or cleared by the [place]  FDA and  has been authorized for detection and/or diagnosis of SARS-CoV-2 by FDA under an Emergency Use Authorization (EUA). This EUA will remain  in effect (meaning this test can be used) for the duration of the COVID-19 declaration under Section 564(b)(1) of the Act, 21 U.S.C.section 360bbb-3(b)(1), unless the authorization is terminated  or revoked sooner.       Influenza A by PCR NEGATIVE NEGATIVE Final   Influenza B by PCR NEGATIVE NEGATIVE Final    Comment: (NOTE) The Xpert Xpress SARS-CoV-2/FLU/RSV plus assay is intended as an aid in the diagnosis of influenza from  Nasopharyngeal swab specimens and should not be used as a sole basis for treatment. Nasal washings and aspirates are unacceptable for Xpert Xpress SARS-CoV-2/FLU/RSV testing.  Fact Sheet for Patients: bloggercourse.com  Fact Sheet for Healthcare Providers: seriousbroker.it  This test is not yet approved or cleared by the [place]  FDA and has been authorized for detection and/or diagnosis of SARS-CoV-2 by FDA under an Emergency Use Authorization (EUA). This EUA will remain in effect (meaning this test can be used) for the duration of the COVID-19 declaration under Section 564(b)(1) of the Act, 21 U.S.C. section 360bbb-3(b)(1), unless the authorization is terminated or revoked.     Resp Syncytial Virus by PCR NEGATIVE NEGATIVE Final    Comment: (NOTE) Fact Sheet for Patients: bloggercourse.com  Fact Sheet for Healthcare Providers: seriousbroker.it  This test is not yet approved or cleared by the [place]  FDA and has been authorized for detection and/or diagnosis of SARS-CoV-2 by FDA under an Emergency Use Authorization (EUA). This EUA will remain in effect (meaning this test can be used) for the duration of the COVID-19 declaration under Section 564(b)(1) of the Act, 21 U.S.C.  section 360bbb-3(b)(1), unless the authorization is terminated or revoked.  Performed at [facility], [address] [place], [place] [address]      Radiology Studies: DG Chest 2 View Result Date: 08/08/2024 CLINICAL DATA:  Cough, short of breath, hypoxia EXAM: CHEST - 2 VIEW COMPARISON:  06/09/2022 FINDINGS: Frontal and lateral views of the chest demonstrate an unremarkable cardiac silhouette. No acute airspace disease, effusion, or pneumothorax. There are no acute bony abnormalities. IMPRESSION: 1. No acute intrathoracic process. Electronically Signed   By: Ozell Daring M.D.    On: 08/08/2024 15:24    Scheduled Meds:  ALPRAZolam   0.5 mg Oral TID   doxycycline   100 mg Oral Q12H   enoxaparin  (LOVENOX ) injection  40 mg Subcutaneous Q24H   insulin  aspart  0-20 Units Subcutaneous TID WC   insulin  aspart  0-5 Units Subcutaneous QHS   ipratropium-albuterol   3 mL Nebulization Q6H   losartan   50 mg Oral Daily   methylPREDNISolone  (SOLU-MEDROL ) injection  40 mg Intravenous Q12H   Followed by   NOREEN ON 08/10/2024] predniSONE   40 mg Oral Q breakfast   potassium chloride   40 mEq Oral Q2H   sertraline   150 mg Oral Daily   Continuous Infusions:  lactated ringers  40 mL/hr at 08/08/24 2255     LOS: 1 day   Ivonne Mustache, MD [facility] P12/06/2024, 8:37 AM

## 2024-08-10 DIAGNOSIS — J9601 Acute respiratory failure with hypoxia: Principal | ICD-10-CM | POA: Diagnosis present

## 2024-08-10 DIAGNOSIS — K580 Irritable bowel syndrome with diarrhea: Secondary | ICD-10-CM

## 2024-08-10 DIAGNOSIS — Z6829 Body mass index (BMI) 29.0-29.9, adult: Secondary | ICD-10-CM

## 2024-08-10 DIAGNOSIS — E669 Obesity, unspecified: Secondary | ICD-10-CM

## 2024-08-10 DIAGNOSIS — E876 Hypokalemia: Secondary | ICD-10-CM

## 2024-08-10 LAB — COMPREHENSIVE METABOLIC PANEL WITH GFR
ALT: 32 U/L (ref 0–44)
AST: 91 U/L — ABNORMAL HIGH (ref 15–41)
Albumin: 3.9 g/dL (ref 3.5–5.0)
Alkaline Phosphatase: 48 U/L (ref 38–126)
Anion gap: 13 (ref 5–15)
BUN: 23 mg/dL (ref 8–23)
CO2: 28 mmol/L (ref 22–32)
Calcium: 9.2 mg/dL (ref 8.9–10.3)
Chloride: 96 mmol/L — ABNORMAL LOW (ref 98–111)
Creatinine, Ser: 0.64 mg/dL (ref 0.44–1.00)
GFR, Estimated: >60 mL/min (ref >60)
Glucose, Bld: 234 mg/dL — ABNORMAL HIGH (ref 70–99)
Potassium: 3.5 mmol/L (ref 3.5–5.1)
Sodium: 137 mmol/L (ref 135–145)
Total Bilirubin: 1.8 mg/dL — ABNORMAL HIGH (ref 0.0–1.2)
Total Protein: 7.4 g/dL (ref 6.5–8.1)

## 2024-08-10 LAB — CBC
HCT: 37.3 % (ref 36.0–46.0)
Hemoglobin: 12.6 g/dL (ref 12.0–15.0)
MCH: 29 pg (ref 26.0–34.0)
MCHC: 33.8 g/dL (ref 30.0–36.0)
MCV: 85.9 fL (ref 80.0–100.0)
Platelets: 93 K/uL — ABNORMAL LOW (ref 150–400)
RBC: 4.34 MIL/uL (ref 3.87–5.11)
RDW: 17.4 % — ABNORMAL HIGH (ref 11.5–15.5)
WBC: 11.7 K/uL — ABNORMAL HIGH (ref 4.0–10.5)
nRBC: 0 % (ref 0.0–0.2)

## 2024-08-10 MED ORDER — MENTHOL 3 MG MT LOZG
1.0000 | LOZENGE | OROMUCOSAL | Status: DC | PRN
  Administered 2024-08-11: 3 mg via ORAL
  Filled 2024-08-10: qty 9

## 2024-08-10 MED ORDER — GLIPIZIDE 5 MG PO TABS
5.0000 mg | ORAL_TABLET | Freq: Every day | ORAL | Status: DC | PRN
  Administered 2024-08-10 – 2024-08-11 (×2): 5 mg via ORAL
  Filled 2024-08-10 (×3): qty 1

## 2024-08-10 MED ORDER — INSULIN GLARGINE 100 UNIT/ML ~~LOC~~ SOLN
6.0000 [IU] | Freq: Every day | SUBCUTANEOUS | Status: DC | PRN
  Administered 2024-08-10 – 2024-08-11 (×2): 6 [IU] via SUBCUTANEOUS
  Filled 2024-08-10 (×3): qty 0.06

## 2024-08-10 MED ADMIN — Guaifenesin Tab ER 12HR 600 MG: 600 mg | ORAL | NDC 63824000850

## 2024-08-10 MED ADMIN — Loperamide HCl Cap 2 MG: 2 mg | ORAL | NDC 59651069101

## 2024-08-10 MED FILL — Loperamide HCl Cap 2 MG: 2.0000 mg | ORAL | Qty: 1 | Status: AC

## 2024-08-10 MED FILL — Guaifenesin Tab ER 12HR 600 MG: 600.0000 mg | ORAL | Qty: 1 | Status: AC

## 2024-08-10 NOTE — Assessment & Plan Note (Addendum)
 Due to COPD exacerbation.  No baseline O2 requirement. 12/12 -- pt briefly required 1 L/min during sleep early AM hours. On RA during virtual visit.  Ambulatory O2 sats on RA maintained in 90's. 12/13 -- on RA, improving. Severe coughing spell early AM  12/14 - o2 sats stable on room air --Supplement O2 as needed to maintain spO2 > 90% --Mgmt of COPD as outlined

## 2024-08-10 NOTE — Assessment & Plan Note (Signed)
 Continue statin

## 2024-08-10 NOTE — Assessment & Plan Note (Addendum)
 Resolved with replacement on 12/11. 12/12 - K 3.8 12/13 -K3.0 12/14 - K 3.3 --d/c on 20 mEq oral K daily --Close PCP follow up BMP --Monitor & replace K PRN

## 2024-08-10 NOTE — Progress Notes (Signed)
 Arrived to patient   she is alert and oriented   she states she is feeling better   lung sounds are still slightly noisey with rhonchi and wheezing   lung sounds are not diminished any longer    vitals are stable   patient was given her evening meds   she and daughter are advised about call the nurse at bedtime for those meds   patient is coughing up quite a bit of mucus   IMM was signed with a time of 1600   08/10/2024

## 2024-08-10 NOTE — Progress Notes (Signed)
 This EMT left the residence to collect Narcotics at [facility]. Narcotics were obtained and sealed into a tamper bag. They were then transported back to the residence. Upon arrival to the home EMTP Montalvo verified the narcotics and did a recount. Narcotics were then placed into the home H@H  med box.

## 2024-08-10 NOTE — Progress Notes (Signed)
 The pt's arm band was not picking up; due to same field team paramedic went out to the home and fixed the pt's arm band.   Virtual nurse stated she was unable to see anything.   The pt's entire equipment set up was moved to her room.   Arm band began to pick up and back log her information and read new information as well.

## 2024-08-10 NOTE — Progress Notes (Signed)
 Went to room 1308 at [facility] to transport pt home for H@H  program. Arrived in the room to find the pt sitting on the side of the bed eating dinner. Pt was very excited to go home. Pt's belongings were gathered and placed in a pt belongings bag. The pt was NOT on oxygen  at this time. The pt's RN stated that they were trying to wean her off of oxygen  throughout the day. The pt was sating at 95% on RA at this time. The RN stated that the oxygen  was more like a PRN need at this time. The pt also knows when she needs to be on oxygen . The pt recovers from a de-sat very quickly as described by the RN. No wheezing or SOB was noticed. The pt had received a neb treatment 30 minutes prior to my arrival and stated that she felt as clear as she could be. No Audible wheezing was present. The pt then ambulated from the bed to the H@H  WC. No assistance was needed from this EMT. This EMT brought a portable concentrator to pickup the pt but the pt showed no symptoms of SOB, or any difficulties. The pt refused the need for 02 at this time. This EMT informed the pt that oxygen  was always available and if she felt like she needed it I would connect it immediately. Pt verbalized understandings at this time. The pt was then taken outside to the H@H  WC van. The pt was then secured into the [facility] via safety belts and floor locking mechanism. The pt was then transported home. No issues were noted during transport. The pt was talkative and laughing throughout transport and denied any SOB or respiratory difficulty speaking between breaths on RA. Upon arrival to the home the pt was taken out of the [place] and wheeled to her front door. The pt then ambulated into the residence using her cane and went to her recliner chair in the living room. The pt had no respiratory issues at this time and remained at 94% on RA during this transition. Once the pt was seated in her chair this EMT began to bring H@H  equipment into the home. All equipment was set up and  working appropriately. All equipment was explained to both the pt and the daughter. Both the pt and her daughter verbalized understandings on how to use and troubleshoot equipment at this time. Vital signs were then obtained on the pt and documented appropriately by virtual RN Barlow. At this time EMTP Montalvo arrived to the residence to finish the admission.

## 2024-08-10 NOTE — Hospital Course (Addendum)
 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 On presentation she was hypoxic. She was put on 2 L of oxygen  per minute nasal cannula oxygen . Exam was notable for significant wheezing.   Patient was admitted to the hospital on evening of 08/08/24 for further management of acute COPD exacerbation complicated by acute respiratory failure with hypoxia. She was started on IV steroids, scheduled and PRN nebulizer treatments and supportive care.   08/09/24 -- Transferred to Hospital at [facility] program.  Day 2 IV steroids.  Still on 1 L/min O2.  08/10/24 -- Steroids transitioned to PO prednisone  - Day 3 of steroids.  On room air this AM.  Had early AM desats on [employer] [facility], placed on O2, off since 6 AM today. Ambulatory O2 sats on room air maintained in the 90s.  08/11/2024 --day 4 of steroids.  Patient remains on room air.  She had severe coughing spell early hours this morning, but otherwise feeling overall better but not yet at baseline and her breathing.

## 2024-08-10 NOTE — Care Management (Signed)
 Transition of Care [facility]) - Inpatient Brief Assessment   Patient Details  Name: NEREIDA SCHEPP MRN: [id number] Date of Birth: 02/03/1941  Transition of [facility]) CM/SW Contact:    Corean JAYSON Canary, RN Phone Number: 08/10/2024, 11:00 AM   Clinical Narrative:  Ms. Yousuf is a 83 year old, lives alone and uses a cane presented with COPD exacerbation. She was on oxygen , walking qualifications reveal  no oxygen  needed at this time.  Her family is staying with her during hospitalization at home. PT has seenher and recommended Home Health. She last had home health with [facility] in 2022.  She may need a walker.  Will follow with patient to set up services needed    Transition of Care Asessment: Insurance and Status: Insurance coverage has been reviewed Patient has primary care physician: Yes Home environment has been reviewed: Lives alone has family close by Prior level of function:: Independent with cane Prior/Current Home Services: No current home services Social Drivers of Health Review: SDOH reviewed no interventions necessary Readmission risk has been reviewed: Yes Transition of care needs: transition of care needs identified, TOC will continue to follow

## 2024-08-10 NOTE — Assessment & Plan Note (Signed)
 Body mass index is 29.54 kg/m. Complicates overall care and prognosis.  Recommend lifestyle modifications including physical activity and diet for weight loss and overall long-term health.

## 2024-08-10 NOTE — Progress Notes (Signed)
 Hospital at Home Daily Progress Note   Patient: Rachael Buck  MRN: [id number]  DOB: 29-May-1941  DOA: 08/08/2024  DOS: the patient was seen and examined on @TODAY @    Patient identified themself as Rachael Buck  DOB 1941/03/15  Medic Lauraine Faes present in the home during encounter and performed the assessment and physical exam.  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 On presentation she was hypoxic. She was put on 2 L of oxygen  per minute nasal cannula oxygen . Exam was notable for significant wheezing.   Patient was admitted to the hospital on evening of 08/08/24 for further management of acute COPD exacerbation complicated by acute respiratory failure with hypoxia. She was started on IV steroids, scheduled and PRN nebulizer treatments and supportive care.   08/09/24 -- Transferred to Hospital at [facility] program.  Day 2 IV steroids.  Still on 1 L/min O2.  08/10/24 -- Steroids transitioned to PO prednisone  - Day 3 of steroids.  On room air this AM.  Had early AM desats on Current Health, placed on O2, off since 6 AM today.    Assessment and Plan:  * COPD exacerbation (HCC) Pt presented with wheezing, dyspnea and new hypoxia, requiring 1-2 L/min [place] o2 on admission.   CXR negative on admission. Started on IV steroids, neb treatments, supplemental O2. 12/12 --  --Continue Prednisone  40 mg (4 days from start) --Continue scheduled Duonebs  --Continue doxycycline  --Add scheduled Mucinex  tablets BID --O2 per protocol --IS and flutter  Acute respiratory failure with hypoxia (HCC) Due to COPD exacerbation.  No baseline O2 requirement. 12/12 -- pt briefly required 1  L/min during sleep early AM hours. On RA during virtual visit. --Check ambulatory O2 sats --Wean O2 as tolerated --Mgmt of COPD as outlined  Type 2 diabetes mellitus with hyperglycemia, without long-term current use of insulin  (HCC) Takes Jardiance  in outpatient setting. Had some steroid induced hyperglycemia while on IV steroids, above 300.  This improved now on prednisone .   --Will cover with glipizide  PRN for CBG > 180 --Check CBG's BID  Hypokalemia Resolved with replacement on 12/11. 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 12/12 --Imodium  PRN added  Obesity, unspecified Body mass index is 29.54 kg/m. Complicates overall care and prognosis.  Recommend lifestyle modifications including physical activity and diet for weight loss and overall long-term health.  Mixed hyperlipidemia --Continue statin     Patient Active Problem List   Diagnosis Date Noted   Acute respiratory failure with hypoxia (HCC) 08/10/2024   COPD exacerbation (HCC) 08/08/2024   Controlled type 2 diabetes mellitus without complication, without long-term current use of insulin  (HCC) 06/07/2024   Type 2 diabetes mellitus with hyperglycemia, without long-term current use of insulin  (HCC) 11/07/2023   Hypoxemia 06/24/2022   Pulmonary nodule 1 cm or greater in diameter 01/13/2021   Severe sepsis (HCC) 12/09/2020   Hypokalemia 12/09/2020   Cirrhosis of liver (HCC) 02/17/2018   S/P total knee replacement, 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   Thrombocytopenia 02/15/2017   Varicose veins of left lower extremity with complications 11/21/2015   Age-related cognitive decline 06/02/2015   Fatigue 07/19/2014   Orthostatic hypotension  05/14/2013   Nail abnormality 09/04/2012   Bleeding disorder 05/08/2012   Varices, 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   NASH (nonalcoholic steatohepatitis) 03/21/2012   HTN (hypertension) 12/21/2011   Depression 03/28/2011   IBS (irritable bowel syndrome) 03/23/2011   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/27/2010   DYSPNEA ON EXERTION 07/27/2010   Uncontrolled diabetes mellitus 07/24/2010   Mixed hyperlipidemia 07/24/2010   Obesity, unspecified 07/24/2010   ALLERGIC RHINITIS CAUSE UNSPECIFIED 07/24/2010   Reflux esophagitis 07/24/2010   PAIN IN JOINT PELVIC REGION AND THIGH 07/24/2010      Significant Events: Admitted 08/08/2024 Transferred to hospital at home program 08/09/2024 Steroids transition from IV to oral 08/10/2024    Subjective / Interval 24 hour History:  Patient seen virtually during this morning's medic visit to the home.  She reports being up overnight with diarrhea 7-8 times.  She reports this is a chronic intermittent issue for her.  She denies no abdominal pain nausea or vomiting.  Also no fevers or chills.  This morning she is on room air satting 95%.  There were hypoxia alarms overnight around 5 AM and she was briefly placed on 1 L of oxygen .  She reports overall feeling better but has persistent cough.  Dyspnea has improved.  Daughter reports patient's coughing fits seem to worsen when patient is talking more.  Patient's cough is productive of thick yellow phlegm.  Patient otherwise reports overall feeling better and no other acute complaints.     Admission Labs: Notable labs on admission: K3.1, chloride 95, glucose 150, platelets 93 Viral PCR is negative for COVID, flu A/B and RSV  Admission Imaging Studies: 2 view chest x-ray showed no acute findings  Significant Labs: K trend: 3.1 >> 3.0 >> 3.8 >> 3.5 Renal function stable WBC 8.9 >> 11.7 today (on steroids Platelets trending stable: 76 >> 93  Significant Imaging  Studies: None  Antibiotic Therapy: Anti-infectives (From admission, onward)    Start     Dose/Rate Route Frequency Ordered Stop   08/08/24 2300  doxycycline  (VIBRA -TABS) tablet 100 mg        100 mg Oral Every 12 hours 08/08/24 2203 08/13/24 2159       Procedures: None  Consultants: None          Physical Exam:    08/10/2024    9:00 AM 08/09/2024    2:41 PM 08/09/2024    9:34 AM  Vitals with BMI  Systolic 140 149 868  Diastolic 66 59 69  Pulse 92 98 93     General exam: awake, alert, no acute distress HEENT: Voice clear, hearing grossly normal, wearing glasses Respiratory system: Lung sounds diminished in the bases with wheezes and rhonchi in the upper fields, productive sounding cough Cardiovascular system: No peripheral edema Gastrointestinal system: soft, NT, ND, no HSM felt, +bowel sounds. Central nervous system: A&O x 3. no gross focal neurologic deficits, normal speech Psychiatry: normal mood, congruent affect, judgement and insight appear normal    Data Reviewed:  As reviewed and significant   Family Communication:   Disposition: Status is: Inpatient Remains inpatient appropriate because: Requires further clinical improvement and ongoing evaluation of her oxygen  requirements.  Anticipate DC in 24 to 48 hours if further improved.   Planned Discharge Destination: Home    Time spent: 45 minutes  Author: Burnard DELENA Cunning, DO [facility]  04/19/2024 7:00 AM  For on call review [email or web address].

## 2024-08-10 NOTE — Assessment & Plan Note (Addendum)
 Pt presented with wheezing, dyspnea and new hypoxia, requiring 1-2 L/min [place] o2 on admission.  CXR negative. Started on IV steroids, neb treatments, supplemental O2. 12/14 -- discharge on 1 more dose prednisone  20 mg tomorrow to complete 5 days, 2 more days doxycycline  --Continue scheduled Duonebs  --Continue doxycycline  --Scheduled Mucinex  tablets BID --O2 per protocol --IS and flutter

## 2024-08-10 NOTE — Progress Notes (Signed)
 Completed phone call with patient's daughter due to patient's oxygen  level via current health being in the high 80's this am (86-89%). Patient's oxygen  has maintained between 91-94% while sleeping. Requested that patient's daughter go ahead and place her on the oxygen  that had been set up by team at initial visit. Oxygen  level currently at 92% per current health. Patient denies any other needs at this time.

## 2024-08-10 NOTE — Progress Notes (Signed)
 Patient reports multiple bowel movements overnight and is requesting imodium . She states diarrhea is a chronic issue for her. Dr. Fausto made aware. See new order.

## 2024-08-10 NOTE — Assessment & Plan Note (Signed)
 With chronic intermittent diarrhea. 12/12 --Imodium  PRN added

## 2024-08-10 NOTE — Progress Notes (Signed)
 Called by Mr Huyett and her daughter, along with other family members. She is coughing quite a bit and requesting a nebulizer treatment. HS medications given; including mucinex  and tessalon  pearls.

## 2024-08-10 NOTE — Progress Notes (Signed)
SATURATION QUALIFICATIONS: (This note is used to comply with regulatory documentation for home oxygen)  Patient Saturations on Room Air at Rest = 95%  Patient Saturations on Room Air while Ambulating = 93%   

## 2024-08-10 NOTE — Progress Notes (Signed)
 Labs were obtained on the pt by this EMT. No issues to report. Pt was glad to be home and thankful for the H@H  program. AT this time labs were taken back to [facility] and dropped off at the lab.

## 2024-08-10 NOTE — Assessment & Plan Note (Addendum)
 12/14 -AM BP 148/76 prior to taking her medications --On home regimen: losartan  50 mg, HCTZ 25 mg --Monitor BP & titrate regimen PRN

## 2024-08-10 NOTE — Assessment & Plan Note (Signed)
 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

## 2024-08-10 NOTE — Progress Notes (Signed)
 The pt was admitted to the hospital at home program today.  The pt was seated in her living room upon arrival.  A safety check was done and the pt was noted to have a clean home with no tripping hazards or risk to the pt or field team. The pt does have one small dog that is friendly.   The pt vitals were taken and are as noted.The pt was noted to have a minor inspiratory wheeze but otherwise clear lung sounds.  Equipment was set up and placed in the living room. The pt's home medication were found and placed in a non-tampered bag. The pt was noted to take Zofran ,anti-diarrheal and tramadol  which were not medications in her MAR. Virtual nurse was made aware of same.   Pt's home medications were placed in the pantry in the dinning room. The pt's new medications were set up and placed on her [facility] rx. The pt and her daughter were informed on how to use the equipment, call in case something happens and about her PRN medications.   The pt denied any pain, chest pain or shortness of breath. The pt states she does not use at home oxygen  expect for once in the past and she was able to feel when she needed it.   The pt does use a cane to walk around her home and states she does not feel weak or unsteady at this time. No meds were given at this time but the pt was  informed that she had same at 2200 and the nurse can call her for these meds or the field team paramedic can comes as well. The pt stated she felt comfortable calling the nurse. The pt and her daughter were told to call if anything were to change and she agreed to same. The pt's visit was complete.

## 2024-08-10 NOTE — Assessment & Plan Note (Addendum)
 Takes Jardiance  in outpatient setting. Had some steroid induced hyperglycemia while on IV steroids, above 300.  This improved now on prednisone .   Covered with glipizide  PRN for CBG > 180 --Twice daily CBG's were monitored Last glucose 163 on AM labs today Last dose prednisone  tomorrow, anticipate sugars will return to baseline --Close PCP follow up

## 2024-08-10 NOTE — Progress Notes (Signed)
 Administered Lantus per order from Dr. Eldonna. Shanda McWhite, RN, supervised administration as noted. Pt informed overnight Paramedic will be out later to check CBG again and administer Glipizide  if needed. Pt encouraged to call with any problems or questions.

## 2024-08-10 NOTE — Progress Notes (Signed)
 Pt seen for routine HatH visit. Pt appears well and states she slept good.  Vital signs and assessment obtained as noted. Pt states she is having diarrhea and needs an imodium  as soon as possible. I administered imodium  as noted. Lung sounds were diminished in bases and wheezing and rhonchi heard in upper fields, see assessment flowsheet. Pt denies SOB when walking but Pt's daughter who is present states she has noticed Pt becoming SOB when talking a lot or walking. Ambulatory sats obtained on RA. Pt has been on RA since 06:30 this morning.   Medications administered as noted, including neb treatment. IV care completed.   Pt had virtual visit with Shanda, RN and Dr. Fausto.   Extra current health router set up. The [facility] router that is usually in the HatH hub, is now plugged in the Pt's living room. The original [facility] router is still plugged in the Pt's bedroom. The router in the bedroom goes with current health main kit at Pt's house when she is discharged.   I let Pt know Faith, EMT will be out soon to draw labs. Pt encouraged to call with any problems or questions throughout the day.

## 2024-08-11 DIAGNOSIS — I10 Essential (primary) hypertension: Secondary | ICD-10-CM

## 2024-08-11 LAB — MAGNESIUM: Magnesium: 1.8 mg/dL (ref 1.7–2.4)

## 2024-08-11 LAB — CBC
HCT: 35.3 % — ABNORMAL LOW (ref 36.0–46.0)
Hemoglobin: 12.2 g/dL (ref 12.0–15.0)
MCH: 29 pg (ref 26.0–34.0)
MCHC: 34.6 g/dL (ref 30.0–36.0)
MCV: 84 fL (ref 80.0–100.0)
Platelets: 74 K/uL — ABNORMAL LOW (ref 150–400)
RBC: 4.2 MIL/uL (ref 3.87–5.11)
RDW: 17.2 % — ABNORMAL HIGH (ref 11.5–15.5)
WBC: 10.7 K/uL — ABNORMAL HIGH (ref 4.0–10.5)
nRBC: 0 % (ref 0.0–0.2)

## 2024-08-11 LAB — COMPREHENSIVE METABOLIC PANEL WITH GFR
ALT: 36 U/L (ref 0–44)
AST: 66 U/L — ABNORMAL HIGH (ref 15–41)
Albumin: 3.7 g/dL (ref 3.5–5.0)
Alkaline Phosphatase: 50 U/L (ref 38–126)
Anion gap: 10 (ref 5–15)
BUN: 20 mg/dL (ref 8–23)
CO2: 29 mmol/L (ref 22–32)
Calcium: 8.8 mg/dL — ABNORMAL LOW (ref 8.9–10.3)
Chloride: 99 mmol/L (ref 98–111)
Creatinine, Ser: 0.75 mg/dL (ref 0.44–1.00)
GFR, Estimated: >60 mL/min (ref >60)
Glucose, Bld: 161 mg/dL — ABNORMAL HIGH (ref 70–99)
Potassium: 3 mmol/L — ABNORMAL LOW (ref 3.5–5.1)
Sodium: 138 mmol/L (ref 135–145)
Total Bilirubin: 1.3 mg/dL — ABNORMAL HIGH (ref 0.0–1.2)
Total Protein: 7.1 g/dL (ref 6.5–8.1)

## 2024-08-11 MED ORDER — ALPRAZOLAM 0.5 MG PO TABS: 0.5000 mg | ORAL_TABLET | Freq: Three times a day (TID) | ORAL | 0 refills | Status: AC

## 2024-08-11 MED ORDER — POTASSIUM CHLORIDE CRYS ER 20 MEQ PO TBCR
40.0000 meq | EXTENDED_RELEASE_TABLET | ORAL | Status: AC
  Administered 2024-08-11 (×2): 40 meq via ORAL
  Filled 2024-08-11 (×2): qty 2

## 2024-08-11 MED ADMIN — Guaifenesin Tab ER 12HR 600 MG: 600 mg | ORAL | NDC 63824000850

## 2024-08-11 MED ADMIN — Loperamide HCl Cap 2 MG: 2 mg | ORAL | NDC 59651069101

## 2024-08-11 NOTE — Progress Notes (Signed)
 Brought patient her potassium medication   BGS  337    she stated she has had some 1/2 and 1/2 tea and a piece of pound cake an hour ago    she was given glipizide  with RN oversee    listened to lung sounds   they have cleared greatly   slight inspiratory and exporitory wheeze   over all clear and moving good air   coughing has decreased  patient looked great   she said she is feeling better but does get SOB while up moving around   she is advised to call the virtual RN if anything is needed

## 2024-08-11 NOTE — TOC Progression Note (Signed)
 Transition of Care [facility]) - Progression Note    Patient Details  Name: Rachael Buck MRN: [id number] Date of Birth: [birth year]  Transition of Care [facility]) CM/SW Contact  Corean JAYSON Canary, RN Phone Number: 08/11/2024, 10:51 AM  Clinical Narrative:     Spoke with the patients daughter Comer . Discussed home health recommendation. The patient had [facility] in 2022 and would like them back, if they do not accept, then whomever works best with her insurance . Sent out in [place]  She mentioned a shower chair, the paitent aslready has a shower stool, often Medicare does not cover these. Her daughter will purchase one.   Barriers to Discharge: No Barriers Identified               Expected Discharge Plan and Services                                   HH Arranged: PT HH Agency: [facility] Date [facility] Agency Contacted: 08/11/24 Time HH Agency Contacted: 1051 Representative spoke with at [facility] Agency: Darleene   Social Drivers of Health (SDOH) Interventions SDOH Screenings   Food Insecurity: No Food Insecurity (08/09/2024)  Housing: Low Risk (08/09/2024)  Transportation Needs: No Transportation Needs (08/09/2024)  Utilities: Not At Risk (08/09/2024)  Alcohol Screen: Low Risk (02/15/2024)  Depression (PHQ2-9): Low Risk (07/30/2024)  Financial Resource Strain: Low Risk (02/15/2024)  Physical Activity: Inactive (02/15/2024)  Social Connections: Moderately Integrated (08/09/2024)  Stress: No Stress Concern Present (02/15/2024)  Tobacco Use: Medium Risk (08/08/2024)  Health Literacy: Adequate Health Literacy (02/15/2024)    Readmission Risk Interventions     No data to display

## 2024-08-11 NOTE — Plan of Care (Signed)

## 2024-08-11 NOTE — Plan of Care (Signed)
°  Problem: Education: Goal: Knowledge of General Education information will improve Description: Including pain rating scale, medication(s)/side effects and non-pharmacologic comfort measures Outcome: Progressing   Problem: Health Behavior/Discharge Planning: Goal: Ability to manage health-related needs will improve Outcome: Progressing   Problem: Clinical Measurements: Goal: Ability to maintain clinical measurements within normal limits will improve Outcome: Progressing Goal: Respiratory complications will improve Outcome: Progressing Goal: Cardiovascular complication will be avoided Outcome: Progressing   Problem: Nutrition: Goal: Adequate nutrition will be maintained Outcome: Progressing   Problem: Elimination: Goal: Will not experience complications related to bowel motility Outcome: Progressing   Problem: Pain Managment: Goal: General experience of comfort will improve and/or be controlled Outcome: Progressing   Problem: Respiratory: Goal: Ability to maintain a clear airway will improve Outcome: Progressing Goal: Levels of oxygenation will improve Outcome: Progressing Goal: Ability to maintain adequate ventilation will improve Outcome: Progressing

## 2024-08-11 NOTE — Progress Notes (Signed)
 Arrived to patient for evening visit   she is alert and oriented   she appears to feel better   she hadnt coughed while I was there until just before me leaving   her evening medication was given with RN oversee   vitals were stable   she didn't have any complaints at this time   patient was prescribed potassium, I will run back to [facility] to pick it up and return for delivery of it   patient is advised to call back if needed for any reason   her grandson was there while her daughter wasn't there

## 2024-08-11 NOTE — Progress Notes (Signed)
 The pt was seen today for a visit. The pt was alert and oriented x4. Her skin was warm, dry and appropriate in color. She had a strong radial pulse and was breathing normally. The pt denied any chest pain or shortness of breath. She states she had a good day overall and was having a strong productive cough.   Vitals were taken and are as noted. Lung sounds were auscultated and the pt was noted to have clear/ inspiratory wheeze in her left lung but diminished and inspiratory wheeze on the right lung. The pt's sugar was checked and was 242. The pt informed that's he need to drink more water and minimize her sugar intake. The pt was assisted wit her nighttime medications.   The pt was asked if she needed assistance with anything else tonight and she denied same, The pt and her daughter were told to call back if anything were to change and agreed to same.

## 2024-08-11 NOTE — Progress Notes (Signed)
 Patient states she is doing well this morning. No complaints of pain or discomfort. Productive cough present. Mild SOB on exertion noted Room air. Patient had a bowel movement this morning. Plan of care reviewed with patient and daughter. Possible discharge tomorrow morning.

## 2024-08-11 NOTE — Progress Notes (Signed)
 Called by Ms Lippens and her daughter. She woke up with a coughing spell and feeling tight Breathing treatment was scheduled for 2AM and held until needed, we administered it now. She coughed up some thick, clear secretions. She reports feeling much better after treatment. Sats remained 96% on room air. PRN robitussin administered and given a cepaol drop to bedside.

## 2024-08-11 NOTE — Progress Notes (Signed)
 Phone call completed with patient's daughter Comer. Introduced myself as the [facility] for the night and reminded her that I'm available all night via tablet or phone call. Plan made for her to reach back out a little later tonight when they are ready to administer night time medications. No needs at this time.

## 2024-08-11 NOTE — Progress Notes (Signed)
 Arrived to patient sitting in her recliner    she states she feels good and that she has coughed up a lot of mucus   daughter states she had a coughing fit early this morning   she is alert and oriented   she ambulates good but still gets a little SOB but recovers nicely   lung sounds are mostly clear and bilateral   she still has an inspiratory wheeze   she has been given all of her meds  blood work was drawn and labeled

## 2024-08-11 NOTE — Progress Notes (Signed)
 Virtual visit with Delon, paramedic while she was at Rachael Drier's house. Rachael Buck is resting in bed with no complaints. She took her PRN glipizide  for CBG 242 and her HX xanax .

## 2024-08-11 NOTE — Progress Notes (Signed)
 Hospital at Home Daily Progress Note   Patient: Rachael Buck  MRN: [id number]  DOB: 1941-01-12  DOA: 08/08/2024  DOS: the patient was seen and examined on @TODAY @    Patient identified themself as Rachael Buck  DOB 1940/10/11  Medic Barnie Bud present in the home during encounter and performed the assessment and physical exam.  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 On presentation she was hypoxic. She was put on 2 L of oxygen  per minute nasal cannula oxygen . Exam was notable for significant wheezing.   Patient was admitted to the hospital on evening of 08/08/24 for further management of acute COPD exacerbation complicated by acute respiratory failure with hypoxia. She was started on IV steroids, scheduled and PRN nebulizer treatments and supportive care.   08/09/24 -- Transferred to [facility] at [facility] program.  Day 2 IV steroids.  Still on 1 L/min O2.  08/10/24 -- Steroids transitioned to PO prednisone  - Day 3 of steroids.  On room air this AM.  Had early AM desats on [facility], placed on O2, off since 6 AM today. Ambulatory O2 sats on room air maintained in the 90s.  08/11/2024 --day 4 of steroids.  Patient remains on room air.  She had severe coughing spell early hours this morning, but otherwise feeling overall better but not yet at baseline and her breathing.    Assessment and Plan:  * COPD exacerbation (HCC) Pt presented with wheezing, dyspnea and new hypoxia, requiring 1-2 L/min [facility] o2 on admission.  CXR negative. Started on IV steroids, neb treatments, supplemental O2. 12/13 -- day 4 of steroids --Continue Prednisone  40 mg (4 days from  start) --Continue scheduled Duonebs  --Continue doxycycline  --Scheduled Mucinex  tablets BID --O2 per protocol --IS and flutter  Acute respiratory failure with hypoxia (HCC) Due to COPD exacerbation.  No baseline O2 requirement. 12/12 -- pt briefly required 1 L/min during sleep early AM hours. On RA during virtual visit.  Ambulatory O2 sats on RA maintained in 90's. 12/13 -- on RA, improving. Severe coughing spell early AM  --Supplement O2 as needed to maintain spO2 > 90% --Mgmt of COPD as outlined  Type 2 diabetes mellitus with hyperglycemia, without long-term current use of insulin  (HCC) Takes Jardiance  in outpatient setting. Had some steroid induced hyperglycemia while on IV steroids, above 300.  This improved now on prednisone .   --Will cover with glipizide  PRN for CBG > 180 --Check CBG's BID  Hypokalemia Resolved with replacement on 12/11. 12/12 - K 3.8 12/13 -K3.0 --Replace with p.o. 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 12/12 --Imodium  PRN added  Obesity, unspecified Body mass index is 29.54 kg/m. Complicates overall care and prognosis.  Recommend lifestyle modifications including physical activity and diet for weight loss and overall long-term health.  Mixed hyperlipidemia --Continue statin     Patient Active Problem List   Diagnosis Date Noted   Acute respiratory failure with hypoxia (HCC) 08/10/2024  COPD exacerbation (HCC) 08/08/2024   Controlled type 2 diabetes mellitus without complication, without long-term current use of insulin  (HCC) 06/07/2024   Type 2 diabetes mellitus with hyperglycemia, without long-term current use of insulin  (HCC) 11/07/2023    Hypoxemia 06/24/2022   Pulmonary nodule 1 cm or greater in diameter 01/13/2021   Severe sepsis (HCC) 12/09/2020   Hypokalemia 12/09/2020   Cirrhosis of liver (HCC) 02/17/2018   S/P total knee replacement, 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   Thrombocytopenia 02/15/2017   Varicose veins of left lower extremity with complications 11/21/2015   Age-related cognitive decline 06/02/2015   Fatigue 07/19/2014   Orthostatic hypotension 05/14/2013   Nail abnormality 09/04/2012   Bleeding disorder 05/08/2012   Varices, 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   NASH (nonalcoholic steatohepatitis) 03/21/2012   HTN (hypertension) 12/21/2011   Depression 03/28/2011   IBS (irritable bowel syndrome) 03/23/2011   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/27/2010   DYSPNEA ON EXERTION 07/27/2010   Uncontrolled diabetes mellitus 07/24/2010   Mixed hyperlipidemia 07/24/2010   Obesity, unspecified 07/24/2010   ALLERGIC RHINITIS CAUSE UNSPECIFIED 07/24/2010   Reflux esophagitis 07/24/2010   PAIN IN JOINT PELVIC REGION AND THIGH 07/24/2010      Significant Events: Admitted 08/08/2024 Transferred to hospital at home program 08/09/2024 Steroids transition from IV to oral 08/10/2024    Subjective / Interval 24 hour History:  Patient seen virtually during this morning's medic visit to the home.  She reports feeling much better overall.  States she slept better last night feels her breathing has improved but not quite back to her baseline yet.  She denies fevers or chills.  She did have a bad coughing spell early hours of this morning that improved after a neb treatment.  She is doing well off oxygen .  Has no other acute complaints.     Admission Labs: Notable labs on admission: K3.1, chloride 95, glucose 150, platelets 93 Viral PCR is negative for COVID, flu A/B and RSV  Admission Imaging Studies: 2 view chest x-ray showed no acute findings  Significant Labs: K trend: 3.1 >> 3.0 >> 3.8 >> 3.5 >>  3.0 Renal function stable WBC 8.9 >> 11.7 >> 10.7 today (on steroids) Platelets trending stable: 76 >> 93 >> 74  Significant Imaging Studies: None  Antibiotic Therapy: Anti-infectives (From admission, onward)    Start     Dose/Rate Route Frequency Ordered Stop   08/08/24 2300  doxycycline  (VIBRA -TABS) tablet 100 mg        100 mg Oral Every 12 hours 08/08/24 2203 08/13/24 2159       Procedures: None  Consultants: None          Physical Exam:    08/11/2024   10:00 AM 08/10/2024    9:59 PM 08/10/2024    3:46 PM  Vitals with BMI  Systolic 165 144 852  Diastolic 68 71 70  Pulse 84 80 92     General exam: awake, alert, no acute distress HEENT: Voice clear, hearing grossly normal, wearing glasses Respiratory system: Lung sounds overall clear with improved air movement, mild inspiratory wheeze, no coarse rhonchi, normal respiratory effort, on room air, no conversational dyspnea or accessory muscle use seen Cardiovascular system: No peripheral edema Gastrointestinal system: soft, NT, ND Central nervous system: A&O x 3. no gross focal neurologic deficits, normal speech Skin: Facial skin appears normal color on video Psychiatry: normal mood, congruent affect, judgement and insight appear normal    Data Reviewed:  As reviewed and significant   Family  Communication:   Disposition: Status is: Inpatient Remains inpatient appropriate because: Requires further clinical improvement and ongoing evaluation of her oxygen  requirements.  Anticipate DC in 24 to 48 hours if further improved.   Planned Discharge Destination: Home    Time spent: 45 minutes  Author: Burnard DELENA Cunning, DO [facility]  04/19/2024 7:00 AM  For on call review [email or web address].

## 2024-08-12 ENCOUNTER — Ambulatory Visit: Payer: Self-pay | Admitting: Internal Medicine

## 2024-08-12 LAB — BASIC METABOLIC PANEL WITH GFR
Anion gap: 9 (ref 5–15)
BUN: 20 mg/dL (ref 8–23)
CO2: 28 mmol/L (ref 22–32)
Calcium: 8.6 mg/dL — ABNORMAL LOW (ref 8.9–10.3)
Chloride: 98 mmol/L (ref 98–111)
Creatinine, Ser: 0.69 mg/dL (ref 0.44–1.00)
GFR, Estimated: >60 mL/min (ref >60)
Glucose, Bld: 163 mg/dL — ABNORMAL HIGH (ref 70–99)
Potassium: 3.3 mmol/L — ABNORMAL LOW (ref 3.5–5.1)
Sodium: 135 mmol/L (ref 135–145)

## 2024-08-12 LAB — CBC
HCT: 35.2 % — ABNORMAL LOW (ref 36.0–46.0)
Hemoglobin: 12.4 g/dL (ref 12.0–15.0)
MCH: 29.5 pg (ref 26.0–34.0)
MCHC: 35.2 g/dL (ref 30.0–36.0)
MCV: 83.6 fL (ref 80.0–100.0)
Platelets: 73 K/uL — ABNORMAL LOW (ref 150–400)
RBC: 4.21 MIL/uL (ref 3.87–5.11)
RDW: 17.3 % — ABNORMAL HIGH (ref 11.5–15.5)
WBC: 9.9 K/uL (ref 4.0–10.5)
nRBC: 0.2 % (ref 0.0–0.2)

## 2024-08-12 MED ORDER — PREDNISONE 20 MG PO TABS: 20.0000 mg | ORAL_TABLET | Freq: Every day | ORAL | 0 refills | Status: AC

## 2024-08-12 MED ORDER — GUAIFENESIN-DM 100-10 MG/5ML PO SYRP: 5.0000 mL | ORAL_SOLUTION | ORAL | 0 refills | Status: AC | PRN

## 2024-08-12 MED ORDER — DOXYCYCLINE HYCLATE 100 MG PO TABS: 100.0000 mg | ORAL_TABLET | Freq: Two times a day (BID) | ORAL | 0 refills | Status: AC

## 2024-08-12 MED ORDER — ADULT MULTIVITAMIN W/MINERALS CH: 1.0000 | ORAL_TABLET | Freq: Every day | ORAL | Status: AC

## 2024-08-12 MED ORDER — GUAIFENESIN ER 600 MG PO TB12: 600.0000 mg | ORAL_TABLET | Freq: Two times a day (BID) | ORAL | 0 refills | Status: AC

## 2024-08-12 MED ORDER — BENZONATATE 200 MG PO CAPS: 200.0000 mg | ORAL_CAPSULE | Freq: Three times a day (TID) | ORAL | 0 refills | Status: AC | PRN

## 2024-08-12 MED ORDER — POTASSIUM CHLORIDE CRYS ER 20 MEQ PO TBCR
40.0000 meq | EXTENDED_RELEASE_TABLET | Freq: Once | ORAL | Status: AC
  Administered 2024-08-12: 40 meq via ORAL
  Filled 2024-08-12: qty 2

## 2024-08-12 MED ORDER — ENSURE MAX PROTEIN PO LIQD: 11.0000 [oz_av] | Freq: Every day | ORAL | Status: AC

## 2024-08-12 MED ORDER — ATORVASTATIN CALCIUM 40 MG PO TABS: 40.0000 mg | ORAL_TABLET | Freq: Every day | ORAL | 1 refills | Status: AC

## 2024-08-12 MED ORDER — IPRATROPIUM-ALBUTEROL 0.5-2.5 (3) MG/3ML IN SOLN: RESPIRATORY_TRACT | 1 refills | Status: AC

## 2024-08-12 MED ORDER — ALPRAZOLAM 0.5 MG PO TABS: ORAL_TABLET | ORAL | Status: AC

## 2024-08-12 MED ORDER — POTASSIUM CHLORIDE CRYS ER 20 MEQ PO TBCR: 20.0000 meq | EXTENDED_RELEASE_TABLET | Freq: Every day | ORAL | 0 refills | Status: AC

## 2024-08-12 MED ADMIN — Guaifenesin Tab ER 12HR 600 MG: 600 mg | ORAL | NDC 63824000850

## 2024-08-12 NOTE — Progress Notes (Signed)
 Pt seen for routine [facility] morning visit. Pt appears well and states that she feels much better.   Vital signs and assessment obtained as noted. Pt's lungs sound much more open than a few days ago in my opinion. Rhonchi heard upon auscultation in all fields. Heart tones are s1 and s2, pulses regular. Abdomen is soft and non-tender with active bowel sounds. Pt denies any more diarrhea.   Pt had virtual visit with Darice and Dr. Fausto. Plan on discharge this afternoon pending labs.  Medications administered as noted. IV care completed. Labs drawn as noted.   Pt encouraged to call with any problems until we come back for second visit.

## 2024-08-12 NOTE — Progress Notes (Signed)
 The pt was seen today for her evening visit. The pt was alert and oriented sitting in her living room upon arrival. The pt states she had a good day today and stated that's he was informed that she will be discharged tomorrow which she said she was okay with.  Vitals were taken and are as noted. The pt's lung sounds were ausculted and noted to be diminished and have both inspiratory and expiratory wheezing. The pt was given her night time medications and her NEB treatment. The pt was noted to have a strong and dry productive cough.  The pt was informed that with her discharge they will be explanting all the next steps. The pt and her daughter were asked if they needed anything else for the night and denied same. They were informed to call if anything were to change and they agreed.

## 2024-08-12 NOTE — Discharge Summary (Signed)
 "  Hospital at Home Physician Discharge Summary   Patient: Rachael Buck MRN: [id number] DOB: 17-Aug-1941  Admit date:     08/08/2024  Discharge date: 08/12/2024  Discharge Physician: Burnard DELENA Cunning   PCP: Watt Harlene BROCKS, MD   Patient identified themself as Rachael Buck  DOB 07-18-1941  Medic Rachael Buck present in the home during encounter and performed the physical exam and assessment.  Patient was seen today via video chat; my physical location [place], [place]    Recommendations at discharge:   Follow up with Primary Care within 1-2 weeks Repeat CBC, BMP at follow up - monitor potassium Follow up with Pulmonology  Discharge Diagnoses: Principal Problem:   COPD exacerbation (HCC) Active Problems:   Mixed hyperlipidemia   Obesity, unspecified   Reflux esophagitis   IBS (irritable bowel syndrome)   HTN (hypertension)   Thrombocytopenia   Hypokalemia   Type 2 diabetes mellitus with hyperglycemia, without long-term current use of insulin  (HCC)   Acute respiratory failure with hypoxia (HCC)  Resolved Problems:   * No resolved hospital problems. 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 On presentation she was hypoxic. She was put on 2 L of oxygen  per minute nasal cannula oxygen . Exam was notable for significant wheezing.   Patient was admitted to the hospital on evening of 08/08/24 for further management of acute COPD exacerbation complicated by acute respiratory failure with hypoxia. She was started on IV steroids, scheduled and PRN nebulizer treatments and supportive care.   08/09/24 -- Transferred to Hospital at [facility] program.  Day 2 IV steroids.  Still on 1 L/min O2.  08/10/24 -- Steroids transitioned to PO prednisone  - Day 3 of  steroids.  On room air this AM.  Had early AM desats on Current [facility], placed on O2, off since 6 AM today. Ambulatory O2 sats on room air maintained in the 90s.  08/11/2024 --day 4 of steroids.  Patient remains on room air.  She had severe coughing spell early hours this morning, but otherwise feeling overall better but not yet at baseline and her breathing.  12/14 - pt doing well today overall.  Cough productive.  Little dyspnea on exertion walking in the home but improved.  Pt medically stable and agreeable for discharge from [facility] program today.   Assessment and Plan: * COPD exacerbation (HCC) Pt presented with wheezing, dyspnea and new hypoxia, requiring 1-2 L/min [place] o2 on admission.  CXR negative. Started on IV steroids, neb treatments, supplemental O2. 12/14 -- discharge on 1 more dose prednisone  20 mg tomorrow to complete 5 days, 2 more days doxycycline  --Continue scheduled Duonebs  --Continue doxycycline  --Scheduled Mucinex  tablets BID --O2 per protocol --IS and flutter  Acute respiratory failure with hypoxia (HCC) Due to COPD exacerbation.  No baseline O2 requirement. 12/12 -- pt briefly required 1 L/min during sleep early AM hours. On RA during virtual visit.  Ambulatory O2 sats on RA maintained in 90's. 12/13 -- on RA, improving. Severe coughing spell early AM  12/14 - o2 sats stable on room air --Supplement O2 as needed to maintain spO2 > 90% --Mgmt of COPD as outlined  Type 2 diabetes mellitus with hyperglycemia, without long-term current use of insulin  (HCC) Takes Jardiance  in outpatient setting. Had some steroid induced hyperglycemia while on IV steroids, above  300.  This improved now on prednisone .   Covered with glipizide  PRN for CBG > 180 --Twice daily CBG's were monitored Last glucose 163 on AM labs today Last dose prednisone  tomorrow, anticipate sugars will return to baseline --Close PCP follow up   Hypokalemia Resolved with replacement on 12/11. 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 12/12 --Imodium  PRN added  Obesity, unspecified Body mass index is 29.54 kg/m. Complicates overall care and prognosis.  Recommend lifestyle modifications including physical activity and diet for weight loss and overall long-term health.  Mixed hyperlipidemia --Continue statin         Consultants: none Procedures performed: none  Disposition: Home Diet recommendation:  Carb modified diet DISCHARGE MEDICATION: Allergies as of 08/12/2024       Reactions   Codeine  Nausea And Vomiting   Metformin  And Related Diarrhea   Shrimp [shellfish Allergy] Swelling        Medication List     STOP taking these medications    avatrombopag maleate  20 MG tablet Commonly known as: DOPTELET    cephALEXin  500 MG capsule Commonly known as: KEFLEX    colestipol  1 g tablet Commonly known as: COLESTID    dapagliflozin  propanediol 10 MG Tabs tablet Commonly known as: Farxiga    Melatonin 10 MG Caps   simvastatin  80 MG tablet Commonly known as: ZOCOR    sucralfate  1 g tablet Commonly known as: CARAFATE        TAKE these medications    acetaminophen  500 MG tablet Commonly known as: TYLENOL  Take 500 mg by mouth every 6 (six) hours as needed for mild pain.   albuterol  108 (90 Base) MCG/ACT inhaler Commonly known as: ProAir  HFA Inhale 2 puffs into the lungs every 4 (four) hours as needed for wheezing or shortness of breath.   ALPRAZolam  0.5 MG tablet Commonly known as: XANAX  TAKE 1 TABLET(0.5 MG) BY MOUTH THREE TIMES DAILY   atorvastatin  40 MG tablet Commonly known as:  LIPITOR Take 1 tablet (40 mg total) by mouth daily.   benzonatate  200 MG capsule Commonly known as: TESSALON  Take 1 capsule (200 mg total) by mouth 3 (three) times daily as needed (cough ]).   bismuth subsalicylate 262 MG/15ML suspension Commonly known as: PEPTO BISMOL Take 30 mLs by mouth every 4 (four) hours as needed.   blood glucose meter kit and supplies Kit Dispense based on patient and insurance preference. Use up to four times daily as directed.   Blood Glucose Monitoring Suppl Devi 1 each by Does not apply route in the morning, at noon, and at bedtime. May substitute to any manufacturer covered by patient's insurance.   ONE TOUCH ULTRA 2 w/Device Kit Use to check blood glucose 1-2x daily   empagliflozin  25 MG Tabs tablet Commonly known as: JARDIANCE  Take 25 mg by mouth daily.   Ensure Max Protein Liqd Take 330 mLs (11 oz total) by mouth daily.   guaiFENesin -dextromethorphan  100-10 MG/5ML syrup Commonly known as: ROBITUSSIN DM Take 5 mLs by mouth every 4 (four) hours as needed for cough.   hydrochlorothiazide  25 MG tablet Commonly known as: HYDRODIURIL  TAKE 1 TABLET(25 MG) BY MOUTH DAILY  hydrocortisone  2.5 % cream Apply 1 application  topically daily as needed (itchy dry skin).   ipratropium-albuterol  0.5-2.5 (3) MG/3ML Soln Commonly known as: DUONEB Use one 3 ml neb treatment as needed every 6-8 hours prn   loperamide  2 MG capsule Commonly known as: IMODIUM  Take 4-6 mg by mouth daily as needed (for IBS symptoms).   losartan  50 MG tablet Commonly known as: COZAAR  TAKE 1 TABLET(50 MG) BY MOUTH DAILY   multivitamin with minerals Tabs tablet Take 1 tablet by mouth daily.   OneTouch Delica Lancets Fine Misc 1 each by Other route daily.   OneTouch Ultra test strip Generic drug: glucose blood TEST BLOOD SUGAR(S) ONCE DAILY IN THE MORNING   potassium chloride  SA 20 MEQ tablet Commonly known as: KLOR-CON  M Take 1 tablet (20 mEq total) by mouth daily.    predniSONE  20 MG tablet Commonly known as: DELTASONE  Take 1 tablet (20 mg total) by mouth daily with breakfast.   sertraline  100 MG tablet Commonly known as: ZOLOFT  Take 1.5 tablets (150 mg total) by mouth daily.   Stiolto Respimat  2.5-2.5 MCG/ACT Aers Generic drug: Tiotropium Bromide-Olodaterol INHALE 2 PUFFS INTO THE LUNGS DAILY   Stiolto Respimat  2.5-2.5 MCG/ACT Aers Generic drug: Tiotropium Bromide-Olodaterol Inhale 2 puffs into the lungs daily.   traMADol  50 MG tablet Commonly known as: ULTRAM  TAKE 1 TABLET(50 MG) BY MOUTH EVERY 6 HOURS AS NEEDED       ASK your doctor about these medications    ALPRAZolam  0.5 MG tablet Commonly known as: XANAX  Take 1 tablet (0.5 mg total) by mouth 3 (three) times daily for 2 days. Ask about: Should I take this medication?   doxycycline  100 MG tablet Commonly known as: VIBRA -TABS Take 1 tablet (100 mg total) by mouth every 12 (twelve) hours for 4 doses. Ask about: Should I take this medication?   guaiFENesin  600 MG 12 hr tablet Commonly known as: MUCINEX  Take 1 tablet (600 mg total) by mouth 2 (two) times daily for 7 days. Ask about: Should I take this medication?        Contact information for after-discharge care     Home Medical Care     [facility]) .   Service: Home Health Services Contact information: [address] [place]  [address] [phone]                    Discharge Exam: Fredricka Weights   08/08/24 1410 08/12/24 0900  Weight: 83 kg 81.7 kg   General exam: awake, alert, no acute distress HEENT: voice clear, hearing grossly normal  Respiratory system: posterior lung fields clear, anterior rhonchi, productive sounding cough, normal respiratory effort on room air, improved air movement. Cardiovascular system: normal S1/S2, RRR.   Gastrointestinal system: soft, NT, ND, +bowel sounds. Central nervous system: A&O x3. no gross focal neurologic  deficits, normal speech Psychiatry: normal mood, congruent affect, judgement and insight appear normal   Condition at discharge: stable  The results of significant diagnostics from this hospitalization (including imaging, microbiology, ancillary and laboratory) are listed below for reference.   Imaging Studies: DG Chest 2 View Result Date: 08/08/2024 CLINICAL DATA:  Cough, short of breath, hypoxia EXAM: CHEST - 2 VIEW COMPARISON:  06/09/2022 FINDINGS: Frontal and lateral views of the chest demonstrate an unremarkable cardiac silhouette. No acute airspace disease, effusion, or pneumothorax. There are no acute bony abnormalities. IMPRESSION: 1. No acute intrathoracic process. Electronically Signed   By: Ozell Daring  M.D.   On: 08/08/2024 15:24    Microbiology: Results for orders placed or performed during the hospital encounter of 08/08/24  Resp panel by RT-PCR (RSV, Flu A&B, Covid) Anterior Nasal Swab     Status: None   Collection Time: 08/08/24  2:15 PM   Specimen: Anterior Nasal Swab  Result Value Ref Range Status   SARS Coronavirus 2 by RT PCR NEGATIVE NEGATIVE Final    Comment: (NOTE) SARS-CoV-2 target nucleic acids are NOT DETECTED.  The SARS-CoV-2 RNA is generally detectable in upper respiratory specimens during the acute phase of infection. The lowest concentration of SARS-CoV-2 viral copies this assay can detect is 138 copies/mL. A negative result does not preclude SARS-Cov-2 infection and should not be used as the sole basis for treatment or other patient management decisions. A negative result may occur with  improper specimen collection/handling, submission of specimen other than nasopharyngeal swab, presence of viral mutation(s) within the areas targeted by this assay, and inadequate number of viral copies(<138 copies/mL). A negative result must be combined with clinical observations, patient history, and epidemiological information. The expected result is  Negative.  Fact Sheet for Patients:  bloggercourse.com  Fact Sheet for Healthcare Providers:  seriousbroker.it  This test is no t yet approved or cleared by the [place]  FDA and  has been authorized for detection and/or diagnosis of SARS-CoV-2 by FDA under an Emergency Use Authorization (EUA). This EUA will remain  in effect (meaning this test can be used) for the duration of the COVID-19 declaration under Section 564(b)(1) of the Act, 21 U.S.C.section 360bbb-3(b)(1), unless the authorization is terminated  or revoked sooner.       Influenza A by PCR NEGATIVE NEGATIVE Final   Influenza B by PCR NEGATIVE NEGATIVE Final    Comment: (NOTE) The Xpert Xpress SARS-CoV-2/FLU/RSV plus assay is intended as an aid in the diagnosis of influenza from Nasopharyngeal swab specimens and should not be used as a sole basis for treatment. Nasal washings and aspirates are unacceptable for Xpert Xpress SARS-CoV-2/FLU/RSV testing.  Fact Sheet for Patients: bloggercourse.com  Fact Sheet for Healthcare Providers: seriousbroker.it  This test is not yet approved or cleared by the [place]  FDA and has been authorized for detection and/or diagnosis of SARS-CoV-2 by FDA under an Emergency Use Authorization (EUA). This EUA will remain in effect (meaning this test can be used) for the duration of the COVID-19 declaration under Section 564(b)(1) of the Act, 21 U.S.C. section 360bbb-3(b)(1), unless the authorization is terminated or revoked.     Resp Syncytial Virus by PCR NEGATIVE NEGATIVE Final    Comment: (NOTE) Fact Sheet for Patients: bloggercourse.com  Fact Sheet for Healthcare Providers: seriousbroker.it  This test is not yet approved or cleared by the [place]  FDA and has been authorized for detection and/or diagnosis of  SARS-CoV-2 by FDA under an Emergency Use Authorization (EUA). This EUA will remain in effect (meaning this test can be used) for the duration of the COVID-19 declaration under Section 564(b)(1) of the Act, 21 U.S.C. section 360bbb-3(b)(1), unless the authorization is terminated or revoked.  Performed at [facility], [address] [place], [place] [address]     Labs: CBC: No results for input(s): WBC, NEUTROABS, HGB, HCT, MCV, PLT in the last 168 hours.  Basic Metabolic Panel: No results for input(s): NA, K, CL, CO2, GLUCOSE, BUN, CREATININE, CALCIUM , MG, PHOS in the last 168 hours.  Liver Function Tests: No results for input(s): AST, ALT, ALKPHOS, BILITOT, PROT,  ALBUMIN in the last 168 hours.  CBG: No results for input(s): GLUCAP in the last 168 hours.   Discharge time spent: greater than 30 minutes.  Signed: Burnard DELENA Cunning, DO [facility] 08/24/2024 "

## 2024-08-12 NOTE — Progress Notes (Addendum)
 Field team on site preparing for d/c later today. Current health visit with provider, patient family, and Tax Inspector. Labs taken  Patient and daughter report no chough spells and she is feeling much better. Slept through the night. BP 148/76. HR 83, rr16. BG 165. On Current health.  Dual sign for the following meds:Atovastatin, Losartan  Wearable currently removed by field staff. Provider is going to provide one more final dose prednisone  half strength for tomorrow and a low dose of potaasium to keep in range current trend up now 3.3, and to follow up with primary care on level maintenance Pt also now taking atovastatin 40mg  and d/c zocor . Depending on labs a decision to d/c can be made. Meds sent to [facility], [employer].

## 2024-08-12 NOTE — Progress Notes (Signed)
 Patient and daughter on virtual for AVS review with Biomedical Scientist. PIV removed. Patients follow ups reviewed, medications to start and stop confirmed pharmacy, neb treatment completed. All questions answered to patients satisfaction and patient discharged.

## 2024-08-12 NOTE — Progress Notes (Signed)
 Called patient via tablet no successful in handoff RN informed she was was only to communicate via phone current health. Spoke to daughter provided eta for filed team arrival was going to assist in self admin meds this am but patient prefers to wait for team to arrive as she is in bathroom currently.

## 2024-08-12 NOTE — Progress Notes (Signed)
 Attempted video call while medic was in the home but we were having technical issues. Made several attempts and restarted equipment with no success. I was able to speak with patient and her daughter via phone call while medic was there for bedtime medication administration. Patient and family had no needs at that time. They were encouraged to use phone to contact RN if needed overnight.

## 2024-08-13 ENCOUNTER — Inpatient Hospital Stay

## 2024-08-13 ENCOUNTER — Telehealth: Payer: Self-pay

## 2024-08-13 NOTE — Transitions of Care (Post Inpatient/ED Visit) (Signed)
 08/13/2024  Name: Rachael Buck MRN: [id number] DOB: 14-Dec-1940  Today's TOC FU Call Status: Today's TOC FU Call Status:: Successful TOC FU Call Completed TOC FU Call Complete Date: 08/13/24  Patient's Name and Date of Birth confirmed. Name, DOB  Transition Care Management Follow-up Telephone Call Date of Discharge: 08/12/24 Discharge Facility: [facility]) Type of Discharge: Inpatient Admission Primary Inpatient Discharge Diagnosis:: COPD Exacerbation How have you been since you were released from the hospital?: Better Any questions or concerns?: No  Items Reviewed: Did you receive and understand the discharge instructions provided?: Yes Medications obtained,verified, and reconciled?: Yes (Medications Reviewed) Any new allergies since your discharge?: No Dietary orders reviewed?: Yes Type of Diet Ordered:: Low sodium Heart Healthy Do you have support at home?: Yes People in Home [RPT]: alone Name of Support/Comfort Primary Source: Katherine Venable  Medications Reviewed Today: Medications Reviewed Today     Reviewed by Moises Reusing, RN (Case Manager) on 08/13/24 at 1511  Med List Status: <None>   Medication Order Taking? Sig Documenting Provider Last Dose Status Informant  acetaminophen  (TYLENOL ) 500 MG tablet [id number] No Take 500 mg by mouth every 6 (six) hours as needed for mild pain. [provider] Unknown Active Self, Pharmacy Records  albuterol  (PROAIR  HFA) 108 (90 Base) MCG/ACT inhaler [id number] No Inhale 2 puffs into the lungs every 4 (four) hours as needed for wheezing or shortness of breath. Mahlon Comer BRAVO, MD Unknown Active Self, Pharmacy Records  ALPRAZolam  (XANAX ) 0.5 MG tablet [id number]  Take 1 tablet (0.5 mg total) by mouth 3 (three) times daily for 2 days. Laurence Locus, DO  Active   ALPRAZolam  (XANAX ) 0.5 MG tablet [id number]  TAKE 1 TABLET(0.5 MG) BY MOUTH THREE TIMES DAILY Fausto Burnard LABOR, DO  Active   atorvastatin  (LIPITOR) 40  MG tablet [id number]  Take 1 tablet (40 mg total) by mouth daily. Fausto Burnard A, DO  Active   benzonatate  (TESSALON ) 200 MG capsule [id number]  Take 1 capsule (200 mg total) by mouth 3 (three) times daily as needed (cough ]). Fausto Burnard A, DO  Active   bismuth subsalicylate (PEPTO BISMOL) 262 MG/15ML suspension [id number] No Take 30 mLs by mouth every 4 (four) hours as needed. [provider] Unknown Active Self, Pharmacy Records  blood glucose meter kit and supplies KIT [id number] No Dispense based on patient and insurance preference. Use up to four times daily as directed. Copland, Harlene BROCKS, MD Unknown Active Self, Pharmacy Records  Blood Glucose Monitoring Suppl (ONE TOUCH ULTRA 2) w/Device KIT [id number] No Use to check blood glucose 1-2x daily Copland, Harlene BROCKS, MD Unknown Active Self, Pharmacy Records  Blood Glucose Monitoring Suppl DEVI [id number] No 1 each by Does not apply route in the morning, at noon, and at bedtime. May substitute to any manufacturer covered by patient's insurance. Copland, Harlene BROCKS, MD Unknown Active Self, Pharmacy Records  doxycycline  (VIBRA -TABS) 100 MG tablet [id number]  Take 1 tablet (100 mg total) by mouth every 12 (twelve) hours for 4 doses. Fausto Burnard A, DO  Active   empagliflozin  (JARDIANCE ) 25 MG TABS tablet [id number] No Take 25 mg by mouth daily. [provider] 08/08/2024 Morning Active Self, Pharmacy Records           Med Note RENNIS, DUROJAHYE' R   Thu Aug 09, 2024  2:21 AM) There is no dispense HX for this medication, Patient is adamant that she is on this medication  Ensure Max Protein (ENSURE MAX PROTEIN) LIQD [id number]  Take  330 mLs (11 oz total) by mouth daily. Fausto Sor A, DO  Active   glucose blood (ONETOUCH ULTRA) test strip [id number] No TEST BLOOD SUGAR(S) ONCE DAILY IN THE MORNING Copland, Jessica C, MD Past Week Active Self, Pharmacy Records  guaiFENesin  (MUCINEX ) 600 MG 12 hr tablet [id number]  Take 1 tablet (600  mg total) by mouth 2 (two) times daily for 7 days. Fausto Sor LABOR, DO  Active   guaiFENesin -dextromethorphan  (ROBITUSSIN DM) 100-10 MG/5ML syrup [id number]  Take 5 mLs by mouth every 4 (four) hours as needed for cough. Fausto Sor LABOR, DO  Active   hydrochlorothiazide  (HYDRODIURIL ) 25 MG tablet [id number] No TAKE 1 TABLET(25 MG) BY MOUTH DAILY Copland, Jessica C, MD 08/08/2024 Morning Active Self, Pharmacy Records  hydrocortisone  2.5 % cream [id number] No Apply 1 application  topically daily as needed (itchy dry skin). [provider] Unknown Active Self, Pharmacy Records  ipratropium-albuterol  (DUONEB) 0.5-2.5 (3) MG/3ML SOLN [id number]  Use one 3 ml neb treatment as needed every 6-8 hours prn Fausto Sor A, DO  Active   loperamide  (IMODIUM ) 2 MG capsule [id number] No Take 4-6 mg by mouth daily as needed (for IBS symptoms).  Patient not taking: Reported on 08/09/2024   [provider] Not Taking Active Self, Pharmacy Records  losartan  (COZAAR ) 50 MG tablet [id number] No TAKE 1 TABLET(50 MG) BY MOUTH DAILY Copland, Jessica C, MD 08/08/2024 Morning Active Self, Pharmacy Records  Multiple Vitamin (MULTIVITAMIN WITH MINERALS) TABS tablet [id number]  Take 1 tablet by mouth daily. Fausto Sor LABOR, DO  Active   [facility] DELICA LANCETS FINE MISC [id number]  1 each by Other route daily. Copland, Harlene BROCKS, MD  Active Self, Pharmacy Records  potassium chloride  SA (KLOR-CON  M) 20 MEQ tablet [id number]  Take 1 tablet (20 mEq total) by mouth daily. Fausto Sor LABOR, DO  Active   predniSONE  (DELTASONE ) 20 MG tablet [id number]  Take 1 tablet (20 mg total) by mouth daily with breakfast. Fausto Sor LABOR, DO  Active   sertraline  (ZOLOFT ) 100 MG tablet [id number] No Take 1.5 tablets (150 mg total) by mouth daily. Copland, Harlene BROCKS, MD 08/08/2024 Morning Active Self, Pharmacy Records  STIOLTO RESPIMAT  2.5-2.5 MCG/ACT AERS [id number] No INHALE 2 PUFFS INTO THE LUNGS DAILY Byrum, Lamar RAMAN, MD Past Week  Active Self, Pharmacy Records  Tiotropium Bromide-Olodaterol (STIOLTO RESPIMAT ) 2.5-2.5 MCG/ACT AERS [id number] No Inhale 2 puffs into the lungs daily. Shelah Lamar RAMAN, MD 08/08/2024 Morning Active Self, Pharmacy Records  traMADol  (ULTRAM ) 50 MG tablet [id number] No TAKE 1 TABLET(50 MG) BY MOUTH EVERY 6 HOURS AS NEEDED Ennever, Maude SAUNDERS, MD Unknown Active Self, Pharmacy Records            Home Care and Equipment/Supplies: Were Home Health Services Ordered?: Yes Name of Home Health Agency:: [facility] Has Agency set up a time to come to your home?: No EMR reviewed for Home Health Orders: Orders present/patient has not received call (refer to CM for follow-up) Any new equipment or medical supplies ordered?: No  Functional Questionnaire: Do you need assistance with bathing/showering or dressing?: No Do you need assistance with meal preparation?: No Do you need assistance with eating?: No Do you have difficulty maintaining continence: No Do you need assistance with getting out of bed/getting out of a chair/moving?: No Do you have difficulty managing or taking your medications?: No  Follow up appointments reviewed: PCP Follow-up appointment confirmed?: Yes Date of PCP follow-up appointment?: 08/16/24 Follow-up Provider: Dr. Watt. Careguide assisted with follow up Specialist  Hospital Follow-up appointment confirmed?: Yes Date of Specialist follow-up appointment?: 08/27/24 Follow-Up Specialty Provider:: Dr. Timmy Do you need transportation to your follow-up appointment?: No Do you understand care options if your condition(s) worsen?: Yes-patient verbalized understanding  SDOH Interventions Today    Flowsheet Row Most Recent Value  SDOH Interventions   Food Insecurity Interventions Intervention Not Indicated  Housing Interventions Intervention Not Indicated  Transportation Interventions Intervention Not Indicated  Utilities Interventions Intervention Not Indicated    Goals Addressed              This Visit's Progress    VBCI Transitions of Care (TOC) Care Plan       Problems:  Recent Hospitalization for treatment of COPD Hospital or ED Adm Risk: 84%  Goal:  Over the next 30 days, the patient will not experience hospital readmission  Interventions:   COPD Interventions: Advised patient to track and manage COPD triggers Assessed social determinant of health barriers Discussed the importance of adequate rest and management of fatigue with COPD Provided education about and advised patient to utilize infection prevention strategies to reduce risk of respiratory infection Provided instruction about proper use of medications used for management of COPD including inhalers Oxygen  on as needed basis but she hasn't used it for a couple of days and the company will take it back.  Patient Self Care Activities:  Attend all scheduled provider appointments Call pharmacy for medication refills 3-7 days in advance of running out of medications Call provider office for new concerns or questions  Notify RN Care Manager of TOC call rescheduling needs Participate in Transition of Care Program/Attend TOC scheduled calls Perform all self care activities independently  Take medications as prescribed    Plan:  Telephone follow up appointment with care management team member scheduled for:  December 23rd at 11:00am         Medford Balboa, BSN, RN [place]  VBCI - [facility] RN Care Manager [phone]

## 2024-08-13 NOTE — Patient Instructions (Signed)
 Visit Information  Thank you for taking time to visit with me today. Please don't hesitate to contact me if I can be of assistance to you before our next scheduled telephone appointment.  Our next appointment is by telephone on December 23rd at 11am  Following is a copy of your care plan:   Goals Addressed             This Visit's Progress    VBCI Transitions of Care (TOC) Care Plan       Problems:  Recent Hospitalization for treatment of COPD Hospital or ED Adm Risk: 84%  Goal:  Over the next 30 days, the patient will not experience hospital readmission  Interventions:   COPD Interventions: Advised patient to track and manage COPD triggers Assessed social determinant of health barriers Discussed the importance of adequate rest and management of fatigue with COPD Provided education about and advised patient to utilize infection prevention strategies to reduce risk of respiratory infection Provided instruction about proper use of medications used for management of COPD including inhalers Oxygen  on as needed basis but she hasn't used it for a couple of days and the company will take it back.  Patient Self Care Activities:  Attend all scheduled provider appointments Call pharmacy for medication refills 3-7 days in advance of running out of medications Call provider office for new concerns or questions  Notify RN Care Manager of TOC call rescheduling needs Participate in Transition of Care Program/Attend TOC scheduled calls Perform all self care activities independently  Take medications as prescribed    Plan:  Telephone follow up appointment with care management team member scheduled for:  December 23rd at 11:00am        Patient verbalizes understanding of instructions and care plan provided today and agrees to view in MyChart. Active MyChart status and patient understanding of how to access instructions and care plan via MyChart confirmed with patient.     The patient  has been provided with contact information for the care management team and has been advised to call with any health related questions or concerns.   Please call the care guide team at [phone] if you need to cancel or reschedule your appointment.   Please call the Suicide and Crisis Lifeline: 988 call the [place]  National Suicide Prevention Lifeline: 435-542-9115 or TTY: 226 151 4124 TTY 323 073 6175) to talk to a trained counselor if you are experiencing a Mental Health or Behavioral Health Crisis or need someone to talk to.  Medford Balboa, BSN, RN [place]  VBCI - [employer] [facility] RN Care Manager [phone]

## 2024-08-14 NOTE — Patient Instructions (Signed)
 I do think you should get a COVID vaccine booster this fall Also recommend a tetanus booster and Shingrix series

## 2024-08-14 NOTE — Progress Notes (Unsigned)
 Rachael Buck [address], [address] [place], [place] [address] [phone]  Date:  08/16/2024   Name:  Rachael Buck   DOB:  1941/08/04   MRN:  [id number]  PCP:  Watt Harlene BROCKS, MD    Chief Complaint: No chief complaint on file.   History of Present Illness:  FLONNIE Buck is a 83 y.o. 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  She was admitted on 12/10 with concern of shortness of breath-COPD exacerbation, required oxygen  in the ER which she does not typically use She was transferred into the hospital at home program and discharged on 12/14  Patient Active Problem List   Diagnosis Date Noted   Acute respiratory failure with hypoxia (HCC) 08/10/2024   COPD exacerbation (HCC) 08/08/2024   Controlled type 2 diabetes mellitus without complication, without long-term current use of insulin  (HCC) 06/07/2024   Type 2 diabetes mellitus with hyperglycemia, without long-term current use of insulin  (HCC) 11/07/2023   Hypoxemia 06/24/2022   Pulmonary nodule 1 cm or greater in diameter 01/13/2021   Severe sepsis (HCC) 12/09/2020   Hypokalemia 12/09/2020   Cirrhosis of liver (HCC) 02/17/2018   S/P total knee replacement, 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   Thrombocytopenia 02/15/2017   Varicose veins of left lower extremity with complications 11/21/2015   Age-related cognitive decline 06/02/2015   Fatigue 07/19/2014   Orthostatic hypotension 05/14/2013   Nail abnormality 09/04/2012   Bleeding disorder 05/08/2012   Varices, 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   NASH (nonalcoholic steatohepatitis) 03/21/2012   HTN (hypertension)  12/21/2011   Depression 03/28/2011   IBS (irritable bowel syndrome) 03/23/2011   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/27/2010   DYSPNEA ON EXERTION 07/27/2010   Uncontrolled diabetes mellitus 07/24/2010   Mixed hyperlipidemia 07/24/2010   Obesity, unspecified 07/24/2010   ALLERGIC RHINITIS CAUSE UNSPECIFIED 07/24/2010   Reflux esophagitis 07/24/2010   PAIN IN JOINT PELVIC REGION AND THIGH 07/24/2010    Past Medical History:  Diagnosis Date   Anxiety    Arthritis    Asthma    Cirrhosis, nonalcoholic (HCC)    Colon polyps    Diabetes mellitus    Diverticulitis    GERD (gastroesophageal reflux disease)    Hyperlipidemia    Hypertension    NASH (nonalcoholic steatohepatitis)    Obesity    Pneumonia     Past Surgical History:  Procedure Laterality Date   CHOLECYSTECTOMY  1981   ENDOVENOUS ABLATION SAPHENOUS VEIN W/ LASER Left 02/21/2018   endovenous laser ablation left greater saphenous vein by Lynwood Collum MD    OOPHORECTOMY  1973   PARTIAL HYSTERECTOMY  1972   ROTATOR CUFF REPAIR     bilateral. 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   Procedure: LEFT TOTAL KNEE ARTHROPLASTY;  Surgeon: Kay Kemps, MD;  Location: [facility] OR;  Service: Orthopedics;  Laterality: Left;   VEIN SURGERY      Social History[1]  Family History  Problem Relation Age of Onset   Stomach cancer Mother    Diabetes Sister    Breast cancer Daughter 56   Breast cancer Maternal Aunt    Diabetes Brother  Cancer Other    GER disease Other    Obesity Other     Allergies[2]  Medication list has been reviewed and updated.  Medications Ordered Prior to Encounter[3]  Review of Systems:  ***  Physical Examination: There were no vitals filed for this visit. There were no vitals filed for this visit. There is no height or weight on file to calculate BMI. Ideal Body Weight:    ***  Assessment and Plan: No  diagnosis found.  Assessment & Plan   Signed Harlene Schroeder, MD    [1]  Social History Tobacco Use   Smoking status: Former    Average packs/day: 1.5 packs/day for 20.0 years (30.0 ttl pk-yrs)    Types: Cigarettes    Start date: 1973   Smokeless tobacco: Never   Tobacco comments:    Quit 2002.  Vaping Use   Vaping status: Never Used  Substance Use Topics   Alcohol use: No   Drug use: No  [2]  Allergies Allergen Reactions   Codeine  Nausea And Vomiting   Metformin  And Related Diarrhea   Shrimp [Shellfish Allergy] Swelling  [3]  Current Outpatient Medications on File Prior to Visit  Medication Sig Dispense Refill   acetaminophen  (TYLENOL ) 500 MG tablet Take 500 mg by mouth every 6 (six) hours as needed for mild pain.     albuterol  (PROAIR  HFA) 108 (90 Base) MCG/ACT inhaler Inhale 2 puffs into the lungs every 4 (four) hours as needed for wheezing or shortness of breath. 1 Inhaler 6   ALPRAZolam  (XANAX ) 0.5 MG tablet TAKE 1 TABLET(0.5 MG) BY MOUTH THREE TIMES DAILY     atorvastatin  (LIPITOR) 40 MG tablet Take 1 tablet (40 mg total) by mouth daily. 30 tablet 1   benzonatate  (TESSALON ) 200 MG capsule Take 1 capsule (200 mg total) by mouth 3 (three) times daily as needed (cough ]). 30 capsule 0   bismuth subsalicylate (PEPTO BISMOL) 262 MG/15ML suspension Take 30 mLs by mouth every 4 (four) hours as needed.     blood glucose meter kit and supplies KIT Dispense based on patient and insurance preference. Use up to four times daily as directed. 1 each 0   Blood Glucose Monitoring Suppl (ONE TOUCH ULTRA 2) w/Device KIT Use to check blood glucose 1-2x daily 1 kit 0   Blood Glucose Monitoring Suppl DEVI 1 each by Does not apply route in the morning, at noon, and at bedtime. May substitute to any manufacturer covered by patient's insurance. 1 each 0   doxycycline  (VIBRA -TABS) 100 MG tablet Take 1 tablet (100 mg total) by mouth every 12 (twelve) hours for 4 doses. 4 tablet 0    empagliflozin  (JARDIANCE ) 25 MG TABS tablet Take 25 mg by mouth daily.     Ensure Max Protein (ENSURE MAX PROTEIN) LIQD Take 330 mLs (11 oz total) by mouth daily.     glucose blood (ONETOUCH ULTRA) test strip TEST BLOOD SUGAR(S) ONCE DAILY IN THE MORNING 100 strip 12   guaiFENesin  (MUCINEX ) 600 MG 12 hr tablet Take 1 tablet (600 mg total) by mouth 2 (two) times daily for 7 days. 14 tablet 0   guaiFENesin -dextromethorphan  (ROBITUSSIN DM) 100-10 MG/5ML syrup Take 5 mLs by mouth every 4 (four) hours as needed for cough. 118 mL 0   hydrochlorothiazide  (HYDRODIURIL ) 25 MG tablet TAKE 1 TABLET(25 MG) BY MOUTH DAILY 90 tablet 3   hydrocortisone  2.5 % cream Apply 1 application  topically daily as needed (itchy dry skin).  ipratropium-albuterol  (DUONEB) 0.5-2.5 (3) MG/3ML SOLN Use one 3 ml neb treatment as needed every 6-8 hours prn 360 mL 1   loperamide  (IMODIUM ) 2 MG capsule Take 4-6 mg by mouth daily as needed (for IBS symptoms). (Patient not taking: Reported on 08/09/2024)     losartan  (COZAAR ) 50 MG tablet TAKE 1 TABLET(50 MG) BY MOUTH DAILY 90 tablet 0   Multiple Vitamin (MULTIVITAMIN WITH MINERALS) TABS tablet Take 1 tablet by mouth daily.     ONETOUCH DELICA LANCETS FINE MISC 1 each by Other route daily. 100 each 12   potassium chloride  SA (KLOR-CON  M) 20 MEQ tablet Take 1 tablet (20 mEq total) by mouth daily. 14 tablet 0   predniSONE  (DELTASONE ) 20 MG tablet Take 1 tablet (20 mg total) by mouth daily with breakfast. 1 tablet 0   sertraline  (ZOLOFT ) 100 MG tablet Take 1.5 tablets (150 mg total) by mouth daily. 135 tablet 3   STIOLTO RESPIMAT  2.5-2.5 MCG/ACT AERS INHALE 2 PUFFS INTO THE LUNGS DAILY 4 g 5   Tiotropium Bromide-Olodaterol (STIOLTO RESPIMAT ) 2.5-2.5 MCG/ACT AERS Inhale 2 puffs into the lungs daily. 4 g 1   traMADol  (ULTRAM ) 50 MG tablet TAKE 1 TABLET(50 MG) BY MOUTH EVERY 6 HOURS AS NEEDED 60 tablet 0   No current facility-administered medications on file prior to visit.

## 2024-08-16 ENCOUNTER — Ambulatory Visit: Admitting: Family Medicine

## 2024-08-16 ENCOUNTER — Encounter: Payer: Self-pay | Admitting: Family Medicine

## 2024-08-16 VITALS — BP 118/62 | HR 88 | Ht 66.0 in | Wt 179.8 lb

## 2024-08-16 DIAGNOSIS — E876 Hypokalemia: Secondary | ICD-10-CM | POA: Diagnosis not present

## 2024-08-16 DIAGNOSIS — J441 Chronic obstructive pulmonary disease with (acute) exacerbation: Secondary | ICD-10-CM

## 2024-08-16 DIAGNOSIS — Z7984 Long term (current) use of oral hypoglycemic drugs: Secondary | ICD-10-CM

## 2024-08-16 DIAGNOSIS — E119 Type 2 diabetes mellitus without complications: Secondary | ICD-10-CM

## 2024-08-16 MED ORDER — GLUCOSE BLOOD VI STRP: ORAL_STRIP | 12 refills | Status: DC

## 2024-08-20 ENCOUNTER — Inpatient Hospital Stay

## 2024-08-20 VITALS — BP 126/42 | HR 88 | Temp 98.0°F | Resp 16

## 2024-08-20 DIAGNOSIS — Z79899 Other long term (current) drug therapy: Secondary | ICD-10-CM | POA: Diagnosis not present

## 2024-08-20 DIAGNOSIS — D500 Iron deficiency anemia secondary to blood loss (chronic): Secondary | ICD-10-CM | POA: Diagnosis present

## 2024-08-20 DIAGNOSIS — J449 Chronic obstructive pulmonary disease, unspecified: Secondary | ICD-10-CM | POA: Diagnosis not present

## 2024-08-20 DIAGNOSIS — R161 Splenomegaly, not elsewhere classified: Secondary | ICD-10-CM | POA: Diagnosis not present

## 2024-08-20 DIAGNOSIS — K7581 Nonalcoholic steatohepatitis (NASH): Secondary | ICD-10-CM | POA: Diagnosis not present

## 2024-08-20 DIAGNOSIS — D6959 Other secondary thrombocytopenia: Secondary | ICD-10-CM | POA: Diagnosis not present

## 2024-08-20 DIAGNOSIS — D696 Thrombocytopenia, unspecified: Secondary | ICD-10-CM

## 2024-08-20 MED ORDER — SODIUM CHLORIDE 0.9 % IV SOLN: INTRAVENOUS | Status: DC

## 2024-08-20 MED ORDER — IRON SUCROSE 20 MG/ML IV SOLN
200.0000 mg | Freq: Once | INTRAVENOUS | Status: AC
  Administered 2024-08-20 (×2): 200 mg via INTRAVENOUS
  Filled 2024-08-20: qty 10

## 2024-08-20 NOTE — Patient Instructions (Signed)

## 2024-08-21 ENCOUNTER — Other Ambulatory Visit: Payer: Self-pay

## 2024-08-27 ENCOUNTER — Inpatient Hospital Stay: Admitting: Hematology & Oncology

## 2024-08-27 ENCOUNTER — Inpatient Hospital Stay

## 2024-08-27 VITALS — BP 113/66 | HR 97 | Temp 98.4°F | Resp 94 | Wt 181.8 lb

## 2024-08-27 DIAGNOSIS — D500 Iron deficiency anemia secondary to blood loss (chronic): Secondary | ICD-10-CM

## 2024-08-27 DIAGNOSIS — K7581 Nonalcoholic steatohepatitis (NASH): Secondary | ICD-10-CM

## 2024-08-27 DIAGNOSIS — D696 Thrombocytopenia, unspecified: Secondary | ICD-10-CM

## 2024-08-27 LAB — CMP (CANCER CENTER ONLY)
ALT: 17 U/L (ref 0–44)
AST: 33 U/L (ref 15–41)
Albumin: 4.4 g/dL (ref 3.5–5.0)
Alkaline Phosphatase: 55 U/L (ref 38–126)
Anion gap: 11 (ref 5–15)
BUN: 15 mg/dL (ref 8–23)
CO2: 32 mmol/L (ref 22–32)
Calcium: 9.5 mg/dL (ref 8.9–10.3)
Chloride: 95 mmol/L — ABNORMAL LOW (ref 98–111)
Creatinine: 0.67 mg/dL (ref 0.44–1.00)
GFR, Estimated: >60 mL/min
Glucose, Bld: 194 mg/dL — ABNORMAL HIGH (ref 70–99)
Potassium: 3.3 mmol/L — ABNORMAL LOW (ref 3.5–5.1)
Sodium: 138 mmol/L (ref 135–145)
Total Bilirubin: 1.6 mg/dL — ABNORMAL HIGH (ref 0.0–1.2)
Total Protein: 7 g/dL (ref 6.5–8.1)

## 2024-08-27 LAB — RETICULOCYTES
Immature Retic Fract: 24 % — ABNORMAL HIGH (ref 2.3–15.9)
RBC.: 3.4 MIL/uL — ABNORMAL LOW (ref 3.87–5.11)
Retic Count, Absolute: 192.4 K/uL — ABNORMAL HIGH (ref 19.0–186.0)
Retic Ct Pct: 5.7 % — ABNORMAL HIGH (ref 0.4–3.1)

## 2024-08-27 LAB — CBC
HCT: 28.2 % — ABNORMAL LOW (ref 36.0–46.0)
Hemoglobin: 9.8 g/dL — ABNORMAL LOW (ref 12.0–15.0)
MCH: 29.3 pg (ref 26.0–34.0)
MCHC: 34.8 g/dL (ref 30.0–36.0)
MCV: 84.2 fL (ref 80.0–100.0)
Platelets: 80 K/uL — ABNORMAL LOW (ref 150–400)
RBC: 3.35 MIL/uL — ABNORMAL LOW (ref 3.87–5.11)
RDW: 17.9 % — ABNORMAL HIGH (ref 11.5–15.5)
WBC: 8.1 K/uL (ref 4.0–10.5)
nRBC: 0 % (ref 0.0–0.2)

## 2024-08-27 LAB — IRON AND IRON BINDING CAPACITY (CC-WL,HP ONLY)
Iron: 87 ug/dL (ref 28–170)
Saturation Ratios: 23 % (ref 10.4–31.8)
TIBC: 372 ug/dL (ref 250–450)
UIBC: 286 ug/dL

## 2024-08-27 NOTE — Progress Notes (Deleted)
 No Nplate  given, platelets count today was 80, 000.

## 2024-08-27 NOTE — Progress Notes (Signed)
 " Hematology and Oncology Follow Up Visit  Rachael Buck [id number] 12-22-1940 83 y.o. 08/27/2024   Principle Diagnosis:  Thrombocytopenia -- NASH/Splenomegaly ; Immune based thrombocytopenia Iron  deficiency   Previous Therapy:  Doptelet  40 mg po q day -- start on 06/23/2023 for dental extraction -now DC'd on 08/15/2023 Doptelet  20 mg po  q day- taken for 5 consecutive days beginning 10 to 13 days prior to scheduled procedure (May 2025)   Current Therapy:        Nplate  as indicated --give for platelet count < 75K IV iron  as indicated  --Venofer  given on 08/20/2024        Interim History:  Rachael Buck is here today for follow-up.  She, surprisingly was hospitalized in mid December because of COPD exacerbation.  She was over at [facility].  She is doing better now.  Her platelet counts were on the higher side when she was in the hospital which I suspect is probably from steroids.  She has had no problems with bleeding.  She has had no problems with bowels or bladder.  Her last ultrasound of the spleen was on 05/31/2024.  Her spleen size was stable at 763 cm.  She still has the problems with the left hip.  She has had no leg swelling.Rachael Buck  Her last iron  studies that were done back in November showed a ferritin of 30 with an iron  saturation of 21%.    She did have a very nice Christmas with her family.  Currently, I would say that her performance status is probably ECOG 2.    Wt Readings from Last 3 Encounters:  08/27/24 181 lb 12.8 oz (82.5 kg)  08/16/24 179 lb 12.8 oz (81.6 kg)  08/12/24 180 lb 1.6 oz (81.7 kg)   Medications:  Allergies as of 08/27/2024       Reactions   Codeine  Nausea And Vomiting   Metformin  And Related Diarrhea   Shrimp [shellfish Allergy] Swelling        Medication List        Accurate as of August 27, 2024  3:13 PM. If you have any questions, ask your nurse or doctor.          acetaminophen  500 MG tablet Commonly known  as: TYLENOL  Take 500 mg by mouth every 6 (six) hours as needed for mild pain.   albuterol  108 (90 Base) MCG/ACT inhaler Commonly known as: ProAir  HFA Inhale 2 puffs into the lungs every 4 (four) hours as needed for wheezing or shortness of breath.   ALPRAZolam  0.5 MG tablet Commonly known as: XANAX  TAKE 1 TABLET(0.5 MG) BY MOUTH THREE TIMES DAILY   atorvastatin  40 MG tablet Commonly known as: LIPITOR Take 1 tablet (40 mg total) by mouth daily.   benzonatate  200 MG capsule Commonly known as: TESSALON  Take 1 capsule (200 mg total) by mouth 3 (three) times daily as needed (cough ]).   bismuth subsalicylate 262 MG/15ML suspension Commonly known as: PEPTO BISMOL Take 30 mLs by mouth every 4 (four) hours as needed.   blood glucose meter kit and supplies Kit Dispense based on patient and insurance preference. Use up to four times daily as directed.   Blood Glucose Monitoring Suppl Devi 1 each by Does not apply route in the morning, at noon, and at bedtime. May substitute to any manufacturer covered by patient's insurance.   ONE TOUCH ULTRA 2 w/Device Kit Use to check blood glucose 1-2x daily   empagliflozin  25 MG Tabs tablet Commonly  known as: JARDIANCE  Take 25 mg by mouth daily.   Ensure Max Protein Liqd Take 330 mLs (11 oz total) by mouth daily.   guaiFENesin -dextromethorphan  100-10 MG/5ML syrup Commonly known as: ROBITUSSIN DM Take 5 mLs by mouth every 4 (four) hours as needed for cough.   hydrochlorothiazide  25 MG tablet Commonly known as: HYDRODIURIL  TAKE 1 TABLET(25 MG) BY MOUTH DAILY   hydrocortisone  2.5 % cream Apply 1 application  topically daily as needed (itchy dry skin).   ipratropium-albuterol  0.5-2.5 (3) MG/3ML Soln Commonly known as: DUONEB Use one 3 ml neb treatment as needed every 6-8 hours prn   loperamide  2 MG capsule Commonly known as: IMODIUM  Take 4-6 mg by mouth daily as needed (for IBS symptoms).   losartan  50 MG tablet Commonly known as:  COZAAR  TAKE 1 TABLET(50 MG) BY MOUTH DAILY   multivitamin with minerals Tabs tablet Take 1 tablet by mouth daily.   OneTouch Delica Lancets Fine Misc 1 each by Other route daily.   OneTouch Ultra test strip Generic drug: glucose blood TEST BLOOD SUGAR(S) ONCE DAILY IN THE MORNING   glucose blood test strip Use as instructed to check glucose cone daily   potassium chloride  SA 20 MEQ tablet Commonly known as: KLOR-CON  M Take 1 tablet (20 mEq total) by mouth daily.   predniSONE  20 MG tablet Commonly known as: DELTASONE  Take 1 tablet (20 mg total) by mouth daily with breakfast.   sertraline  100 MG tablet Commonly known as: ZOLOFT  Take 1.5 tablets (150 mg total) by mouth daily.   Stiolto Respimat  2.5-2.5 MCG/ACT Aers Generic drug: Tiotropium Bromide-Olodaterol INHALE 2 PUFFS INTO THE LUNGS DAILY   Stiolto Respimat  2.5-2.5 MCG/ACT Aers Generic drug: Tiotropium Bromide-Olodaterol Inhale 2 puffs into the lungs daily.   traMADol  50 MG tablet Commonly known as: ULTRAM  TAKE 1 TABLET(50 MG) BY MOUTH EVERY 6 HOURS AS NEEDED        Allergies:  Allergies  Allergen Reactions   Codeine  Nausea And Vomiting   Metformin  And Related Diarrhea   Shrimp [Shellfish Allergy] Swelling    Past Medical History, Surgical history, Social history, and Family History were reviewed and updated.  Review of Systems: Review of Systems  Constitutional:  Negative for malaise/fatigue.  HENT: Negative.    Eyes: Negative.   Respiratory: Negative.    Cardiovascular: Negative.   Gastrointestinal:  Negative for abdominal pain.  Genitourinary: Negative.   Musculoskeletal: Negative.   Skin: Negative.   Neurological: Negative.   Endo/Heme/Allergies: Negative.   Psychiatric/Behavioral: Negative.     Physical Exam:  weight is 181 lb 12.8 oz (82.5 kg). Her oral temperature is 98.4 F (36.9 C). Her blood pressure is 113/66 and her pulse is 97. Her respiration is 94 (abnormal) and oxygen  saturation  is 94%.   Wt Readings from Last 3 Encounters:  08/27/24 181 lb 12.8 oz (82.5 kg)  08/16/24 179 lb 12.8 oz (81.6 kg)  08/12/24 180 lb 1.6 oz (81.7 kg)    Physical Exam Vitals reviewed.  HENT:     Head: Normocephalic and atraumatic.  Eyes:     Pupils: Pupils are equal, round, and reactive to light.  Cardiovascular:     Rate and Rhythm: Normal rate and regular rhythm.     Heart sounds: Normal heart sounds.  Pulmonary:     Effort: Pulmonary effort is normal.     Breath sounds: Normal breath sounds.  Abdominal:     General: Bowel sounds are normal.     Palpations: Abdomen is soft.  Musculoskeletal:        General: No tenderness or deformity. Normal range of motion.     Cervical back: Normal range of motion.  Lymphadenopathy:     Cervical: No cervical adenopathy.  Skin:    General: Skin is warm and dry.     Findings: No erythema or rash.  Neurological:     Mental Status: She is alert and oriented to person, place, and time.  Psychiatric:        Behavior: Behavior normal.        Thought Content: Thought content normal.        Judgment: Judgment normal.      Lab Results  Component Value Date   WBC 8.1 08/27/2024   HGB 9.8 (L) 08/27/2024   HCT 28.2 (L) 08/27/2024   MCV 84.2 08/27/2024   PLT 80 (L) 08/27/2024   Lab Results  Component Value Date   FERRITIN 30 07/16/2024   IRON  102 07/16/2024   TIBC 494 (H) 07/16/2024   UIBC 392 07/16/2024   IRONPCTSAT 21 07/16/2024   Lab Results  Component Value Date   RETICCTPCT 5.7 (H) 08/27/2024   RBC 3.35 (L) 08/27/2024   No results found for: KPAFRELGTCHN, LAMBDASER, KAPLAMBRATIO No results found for: IGGSERUM, IGA, IGMSERUM No results found for: STEPHANY CARLOTA BENSON MARKEL EARLA JOANNIE DOC VICK, SPEI   Chemistry      Component Value Date/Time   NA 135 08/12/2024 0948   NA 127 (L) 02/14/2017 1335   K 3.3 (L) 08/12/2024 0948   K 3.6 02/14/2017 1335   CL 98 08/12/2024 0948    CL 92 (L) 02/14/2017 1335   CO2 28 08/12/2024 0948   CO2 28 02/14/2017 1335   BUN 20 08/12/2024 0948   BUN 7 (L) 02/14/2017 1335   CREATININE 0.69 08/12/2024 0948   CREATININE 0.74 07/16/2024 1017   CREATININE 0.47 (L) 02/14/2017 1335   CREATININE 0.56 07/24/2014 1204      Component Value Date/Time   CALCIUM  8.6 (L) 08/12/2024 0948   CALCIUM  9.3 02/14/2017 1335   ALKPHOS 50 08/11/2024 1212   ALKPHOS 68 02/14/2017 1335   AST 66 (H) 08/11/2024 1212   AST 27 07/16/2024 1017   ALT 36 08/11/2024 1212   ALT 13 07/16/2024 1017   BILITOT 1.3 (H) 08/11/2024 1212   BILITOT 1.3 (H) 07/16/2024 1017      Impression and Plan: Rachael Buck is a very pleasant 83 yo caucasian female with 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   I happy that everything looks good with her platelet count.  She does not need any Nplate  today.  Will see what her iron  levels look like.  I am glad she got through the hospitalization without a lot of difficulties.  We will go ahead and plan to get her back now in another 6 weeks.    Rachael JONELLE Crease, MD 12/29/20253:13 PM "

## 2024-08-28 LAB — FERRITIN: Ferritin: 257 ng/mL (ref 11–307)

## 2024-09-04 ENCOUNTER — Other Ambulatory Visit: Payer: Self-pay | Admitting: Emergency Medicine

## 2024-09-06 ENCOUNTER — Other Ambulatory Visit: Payer: Self-pay

## 2024-09-21 ENCOUNTER — Other Ambulatory Visit: Payer: Self-pay | Admitting: Family Medicine

## 2024-09-21 DIAGNOSIS — E119 Type 2 diabetes mellitus without complications: Secondary | ICD-10-CM

## 2024-10-08 ENCOUNTER — Inpatient Hospital Stay: Admitting: Family

## 2024-10-08 ENCOUNTER — Inpatient Hospital Stay: Attending: Family

## 2024-10-08 ENCOUNTER — Inpatient Hospital Stay

## 2024-10-11 ENCOUNTER — Ambulatory Visit: Admitting: Emergency Medicine

## 2025-02-15 ENCOUNTER — Ambulatory Visit
